# Patient Record
Sex: Female | Born: 1980 | Race: White | Hispanic: No | Marital: Married | Smoking: Current every day smoker
Health system: Southern US, Community
[De-identification: ages and names within clinical notes are randomized; demographics above are authoritative.]

## PROBLEM LIST (undated history)

## (undated) VITALS — BP 125/89 | HR 80 | Temp 97.5°F | Resp 15 | Ht 65.0 in | Wt 140.0 lb

## (undated) DIAGNOSIS — R002 Palpitations: Secondary | ICD-10-CM

## (undated) DIAGNOSIS — I493 Ventricular premature depolarization: Secondary | ICD-10-CM

## (undated) DIAGNOSIS — I10 Essential (primary) hypertension: Secondary | ICD-10-CM

## (undated) DIAGNOSIS — G4700 Insomnia, unspecified: Secondary | ICD-10-CM

## (undated) DIAGNOSIS — J42 Unspecified chronic bronchitis: Secondary | ICD-10-CM

## (undated) DIAGNOSIS — F419 Anxiety disorder, unspecified: Secondary | ICD-10-CM

## (undated) DIAGNOSIS — R55 Syncope and collapse: Secondary | ICD-10-CM

## (undated) DIAGNOSIS — R569 Unspecified convulsions: Secondary | ICD-10-CM

## (undated) DIAGNOSIS — F329 Major depressive disorder, single episode, unspecified: Secondary | ICD-10-CM

## (undated) DIAGNOSIS — K219 Gastro-esophageal reflux disease without esophagitis: Secondary | ICD-10-CM

## (undated) DIAGNOSIS — F32A Depression, unspecified: Secondary | ICD-10-CM

## (undated) DIAGNOSIS — R000 Tachycardia, unspecified: Secondary | ICD-10-CM

## (undated) DIAGNOSIS — R112 Nausea with vomiting, unspecified: Secondary | ICD-10-CM

## (undated) DIAGNOSIS — J189 Pneumonia, unspecified organism: Secondary | ICD-10-CM

## (undated) DIAGNOSIS — J302 Other seasonal allergic rhinitis: Secondary | ICD-10-CM

## (undated) HISTORY — DX: Anxiety disorder, unspecified: F41.9

## (undated) HISTORY — DX: Unspecified convulsions: R56.9

## (undated) HISTORY — DX: Depression, unspecified: F32.A

## (undated) HISTORY — DX: Other seasonal allergic rhinitis: J30.2

## (undated) HISTORY — PX: INTRAUTERINE DEVICE INSERTION: SHX323

## (undated) HISTORY — DX: Syncope and collapse: R55

## (undated) HISTORY — DX: Ventricular premature depolarization: I49.3

## (undated) HISTORY — DX: Tachycardia, unspecified: R00.0

## (undated) HISTORY — DX: Gastro-esophageal reflux disease without esophagitis: K21.9

## (undated) HISTORY — DX: Palpitations: R00.2

## (undated) HISTORY — DX: Major depressive disorder, single episode, unspecified: F32.9

## (undated) HISTORY — DX: Insomnia, unspecified: G47.00

---
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
## 1999-06-21 ENCOUNTER — Emergency Department (HOSPITAL_COMMUNITY): Admission: EM | Admit: 1999-06-21 | Discharge: 1999-06-21 | Payer: Self-pay | Admitting: Internal Medicine

## 1999-11-30 ENCOUNTER — Other Ambulatory Visit: Admission: RE | Admit: 1999-11-30 | Discharge: 1999-11-30 | Payer: Self-pay | Admitting: *Deleted

## 2000-12-13 ENCOUNTER — Other Ambulatory Visit: Admission: RE | Admit: 2000-12-13 | Discharge: 2000-12-13 | Payer: Self-pay | Admitting: *Deleted

## 2005-08-24 ENCOUNTER — Other Ambulatory Visit: Admission: RE | Admit: 2005-08-24 | Discharge: 2005-08-24 | Payer: Self-pay | Admitting: Obstetrics and Gynecology

## 2006-02-05 ENCOUNTER — Inpatient Hospital Stay (HOSPITAL_COMMUNITY): Admission: AD | Admit: 2006-02-05 | Discharge: 2006-02-05 | Payer: Self-pay | Admitting: Obstetrics and Gynecology

## 2006-02-06 ENCOUNTER — Inpatient Hospital Stay (HOSPITAL_COMMUNITY): Admission: AD | Admit: 2006-02-06 | Discharge: 2006-02-06 | Payer: Self-pay | Admitting: Obstetrics and Gynecology

## 2006-02-08 ENCOUNTER — Inpatient Hospital Stay (HOSPITAL_COMMUNITY): Admission: AD | Admit: 2006-02-08 | Discharge: 2006-02-11 | Payer: Self-pay | Admitting: Obstetrics and Gynecology

## 2006-02-12 ENCOUNTER — Encounter: Admission: RE | Admit: 2006-02-12 | Discharge: 2006-03-14 | Payer: Self-pay | Admitting: Obstetrics and Gynecology

## 2008-03-13 ENCOUNTER — Emergency Department (HOSPITAL_COMMUNITY): Admission: EM | Admit: 2008-03-13 | Discharge: 2008-03-14 | Payer: Self-pay | Admitting: Emergency Medicine

## 2009-08-10 ENCOUNTER — Inpatient Hospital Stay (HOSPITAL_COMMUNITY): Admission: AD | Admit: 2009-08-10 | Discharge: 2009-08-10 | Payer: Self-pay | Admitting: Obstetrics and Gynecology

## 2009-09-06 ENCOUNTER — Inpatient Hospital Stay (HOSPITAL_COMMUNITY): Admission: AD | Admit: 2009-09-06 | Discharge: 2009-09-07 | Payer: Self-pay | Admitting: Obstetrics and Gynecology

## 2009-09-19 ENCOUNTER — Inpatient Hospital Stay (HOSPITAL_COMMUNITY): Admission: AD | Admit: 2009-09-19 | Discharge: 2009-09-19 | Payer: Self-pay | Admitting: Obstetrics and Gynecology

## 2009-10-03 ENCOUNTER — Inpatient Hospital Stay (HOSPITAL_COMMUNITY): Admission: AD | Admit: 2009-10-03 | Discharge: 2009-10-03 | Payer: Self-pay | Admitting: Obstetrics and Gynecology

## 2009-10-13 ENCOUNTER — Inpatient Hospital Stay (HOSPITAL_COMMUNITY): Admission: AD | Admit: 2009-10-13 | Discharge: 2009-10-15 | Payer: Self-pay | Admitting: Obstetrics & Gynecology

## 2009-10-18 ENCOUNTER — Encounter: Admission: RE | Admit: 2009-10-18 | Discharge: 2009-11-16 | Payer: Self-pay | Admitting: Obstetrics and Gynecology

## 2009-11-17 ENCOUNTER — Encounter: Admission: RE | Admit: 2009-11-17 | Discharge: 2009-11-25 | Payer: Self-pay | Admitting: Obstetrics and Gynecology

## 2010-02-18 ENCOUNTER — Emergency Department (HOSPITAL_COMMUNITY): Admission: EM | Admit: 2010-02-18 | Discharge: 2010-02-18 | Payer: Self-pay | Admitting: Family Medicine

## 2010-10-27 ENCOUNTER — Emergency Department (HOSPITAL_COMMUNITY)
Admission: EM | Admit: 2010-10-27 | Discharge: 2010-10-27 | Payer: Self-pay | Source: Home / Self Care | Admitting: Family Medicine

## 2011-03-01 LAB — CBC
HCT: 26.1 % — ABNORMAL LOW (ref 36.0–46.0)
HCT: 35.5 % — ABNORMAL LOW (ref 36.0–46.0)
Hemoglobin: 9.1 g/dL — ABNORMAL LOW (ref 12.0–15.0)
MCV: 93.2 fL (ref 78.0–100.0)
RBC: 2.79 MIL/uL — ABNORMAL LOW (ref 3.87–5.11)
RBC: 3.81 MIL/uL — ABNORMAL LOW (ref 3.87–5.11)
RDW: 13.8 % (ref 11.5–15.5)
WBC: 10.5 10*3/uL (ref 4.0–10.5)

## 2011-03-01 LAB — RPR: RPR Ser Ql: NONREACTIVE

## 2011-03-24 ENCOUNTER — Emergency Department (HOSPITAL_COMMUNITY)
Admission: EM | Admit: 2011-03-24 | Discharge: 2011-03-24 | Disposition: A | Discharge status: Home / Self Care | Payer: 59 | Attending: Emergency Medicine | Admitting: Emergency Medicine

## 2011-03-24 DIAGNOSIS — R000 Tachycardia, unspecified: Secondary | ICD-10-CM | POA: Insufficient documentation

## 2011-03-24 DIAGNOSIS — R002 Palpitations: Secondary | ICD-10-CM | POA: Insufficient documentation

## 2011-03-24 DIAGNOSIS — Z79899 Other long term (current) drug therapy: Secondary | ICD-10-CM | POA: Insufficient documentation

## 2011-03-24 DIAGNOSIS — R259 Unspecified abnormal involuntary movements: Secondary | ICD-10-CM | POA: Insufficient documentation

## 2011-03-24 DIAGNOSIS — F329 Major depressive disorder, single episode, unspecified: Secondary | ICD-10-CM | POA: Insufficient documentation

## 2011-03-24 DIAGNOSIS — J45909 Unspecified asthma, uncomplicated: Secondary | ICD-10-CM | POA: Insufficient documentation

## 2011-03-24 DIAGNOSIS — R0789 Other chest pain: Secondary | ICD-10-CM | POA: Insufficient documentation

## 2011-03-24 DIAGNOSIS — F3289 Other specified depressive episodes: Secondary | ICD-10-CM | POA: Insufficient documentation

## 2011-03-24 LAB — POCT I-STAT, CHEM 8
Calcium, Ion: 1.1 mmol/L — ABNORMAL LOW (ref 1.12–1.32)
Chloride: 108 mEq/L (ref 96–112)
Creatinine, Ser: 0.6 mg/dL (ref 0.4–1.2)
Glucose, Bld: 112 mg/dL — ABNORMAL HIGH (ref 70–99)
HCT: 36 % (ref 36.0–46.0)

## 2011-03-28 ENCOUNTER — Ambulatory Visit: Payer: Self-pay | Admitting: Physical Therapy

## 2011-03-29 ENCOUNTER — Ambulatory Visit: Payer: Commercial Managed Care - PPO | Attending: Internal Medicine | Admitting: Physical Therapy

## 2011-03-29 DIAGNOSIS — M25519 Pain in unspecified shoulder: Secondary | ICD-10-CM | POA: Insufficient documentation

## 2011-03-29 DIAGNOSIS — IMO0001 Reserved for inherently not codable concepts without codable children: Secondary | ICD-10-CM | POA: Insufficient documentation

## 2011-03-29 DIAGNOSIS — R293 Abnormal posture: Secondary | ICD-10-CM | POA: Insufficient documentation

## 2011-04-05 ENCOUNTER — Encounter: Payer: Commercial Managed Care - PPO | Admitting: Physical Therapy

## 2011-04-11 ENCOUNTER — Encounter: Payer: Commercial Managed Care - PPO | Admitting: Physical Therapy

## 2011-04-14 ENCOUNTER — Telehealth: Payer: Self-pay | Admitting: Internal Medicine

## 2011-04-14 DIAGNOSIS — R000 Tachycardia, unspecified: Secondary | ICD-10-CM

## 2011-04-14 NOTE — Discharge Summary (Signed)
NAMELAURENASHLEY, VIAR NO.:  [id number]   MEDICAL RECORD NO.:  [id number]          PATIENT TYPE:  INP   LOCATION:  9119                          FACILITY:  [facility]   PHYSICIAN:  Duke Salvia. Marcelle Overlie, M.D.DATE OF BIRTH:  1981-10-08   DATE OF ADMISSION:  02/08/2006  DATE OF DISCHARGE:  02/11/2006                                 DISCHARGE SUMMARY   ADMITTING DIAGNOSES:  1.  Intrauterine pregnancy at 11 weeks estimated gestational age.  2.  Pregnancy-induced hypertension.  3.  Breech presentation.   DISCHARGE DIAGNOSES:  1.  Status post low transverse cesarean section.  2.  Viable female infant.   PROCEDURE:  Primary low transverse cesarean section.   REASON FOR ADMISSION:  Please see dictated H&P.   HOSPITAL COURSE:  The patient is a 30 year old primigravida that was  observed in the office at 36 weeks estimated gestational age with noted  elevation in blood pressure.  The patient had been hospitalized recently for  observation regarding elevation in blood pressure.  The patient had had PIH  labs which had been within normal limits.  The patient was now noted in the  office with blood pressure in the 140s to 150s over 100 with lowest blood  pressure being 140/80.  The patient denied any headache, blurred vision or  right upper quadrant pain.  The baby was also noted to be in the breech  presentation.  Due to severe elevation in blood pressure and proteinuria the  patient was admitted now for delivery.  The patient was then transferred to  the operating room where spinal anesthesia was administered without  difficulty.  A low transverse incision was made with the delivery of a  viable female infant weighing 5 pounds 0 ounces with Apgars of 9 at one minute  and 9 at five minutes.  Arterial cord pH was 7.35.  The patient tolerated  the procedure well and was taken to the recovery room in stable condition.  The patient was then transferred to the adult intensive care unit  where  magnesium sulfate was administered and the patient could be observed closely  over the next 24 hours.  On the following morning the patient was without  complaint.  Vital signs were stable.  Blood pressure 110/80.  Laboratory  findings revealed a sodium of 133, SGOT of 19, SGPT of 11.  Other laboratory  findings revealed a hemoglobin of 9.6; platelet count 171,000; wbc count of  12.6.  Fundus was firm and nontender.  Incision was clean, dry and intact.  The patient continued on magnesium sulfate for the following 24 hours.  On  postoperative day #2 the patient was doing well.  She was afebrile.  Fundus  firm and nontender.  Incision was clean, dry and intact.  Abdomen soft.  The  patient was now transferred to the mother-baby unit.  Magnesium sulfate was  now discontinued.  On postoperative day #3 the patient remained afebrile.  Fundus firm and nontender and vital signs were stable with blood pressure  121/80 to 150/98.  Lungs were clear to auscultation.  Incision was  clean,  dry and intact.  The patient was ambulating well and tolerating a regular  diet without complaints of nausea and vomiting.  The patient continued to  deny headache, right upper quadrant pain or blurred vision.  Instructions  were given and the patient was later discharged home.   CONDITION ON DISCHARGE:  Stable.   DIET:  Regular as tolerated.   ACTIVITY:  No heavy lifting, no driving x2 weeks, no vaginal entry.   FOLLOWUP:  The patient to follow up in the office in 1 week for an incision  check.  She is to call for temperature greater than 100 degrees, persistent  nausea and vomiting, heavy vaginal bleeding, and/or redness or drainage from  the incisional site.  The patient was also to call for headache, blurred  vision, or right upper quadrant pain.   DISCHARGE MEDICATIONS:  1.  Tylox #30 one p.o. every 4-6 hours p.r.n.  2.  Motrin 600 mg every 6 hours.  3.  Prenatal vitamins one p.o. daily.  4.   Colace one p.o. daily p.r.n.      Julio Sicks, N.P.      Richard M. Marcelle Overlie, M.D.  Electronically Signed    CC/MEDQ  D:  03/06/2006  T:  03/06/2006  Job:  [id number]

## 2011-04-14 NOTE — Telephone Encounter (Signed)
Dr. Graciela Husbands okay to schedule a 2-D echo. Patient aware that the scheduler will call next week to set the date and time of echo. Patient verbalized understanding.

## 2011-04-14 NOTE — Telephone Encounter (Signed)
Left a message to call back.

## 2011-04-14 NOTE — Op Note (Signed)
Kelsey [name], Kelsey [name] NO.:  [id number]   MEDICAL RECORD NO.:  [id number]          PATIENT TYPE:  INP   LOCATION:  9119                          [facility]ACILITY:  [facility]   PHYSICIAN:  Dineen Kid. Rana Snare, M.D.    DATE OF BIRTH:  1981/06/19   DATE OF PROCEDURE:  02/08/2006  DATE OF DISCHARGE:                                 OPERATIVE REPORT   Kelsey [name] presents today for evaluation of elevated blood pressure.  She has  been on bed rest for the last week due to elevated blood pressure.  Seen in  the hospital two days ago with normal preeclampsia labs.  In the office  today had blood pressure 150/112. On presentation to [facility] triage, blood  pressures ranged from in the 140-150 range/80-100. She has trace protein.  Baby is in breech presentation.  Because of preeclampsia and breech  presentation, planned delivery by primary low transverse cesarean section.  The risks and benefits were discussed and informed consent was obtained.  Her estimated date of confinement is March 09, 2006.   OPERATIVE FINDINGS:  Viable female infant, Apgars 9 and 9.  PH arterial 7.35.  Weight is 5 pounds 0 ounces.   DESCRIPTION OF PROCEDURE:  After adequate anesthesia, the patient was placed  in the supine position, left lateral tilt.  She was sterilely prepped and  draped.  Bladder was sterilely drained with a Foley catheter.  A  Pfannenstiel skin incision was made two fingerbreadths above the pubic  symphysis and taken down sharply to the fascia which was incised  transversely, extended superiorly and inferiorly off the bellies of the  rectus muscle which were separated sharply in the midline.  Peritoneum was  entered sharply, bladder flap was created, placed behind the bladder blade.  A low segment myotomy incision was made down to the infant's vertex,  extended laterally with operator's fingertips.  The infant's buttocks were  then delivered atraumatically.  Arms were easily reduced. The head was  easily reduced.  Nuchal cord x1 was also reduced.  Cord was clamped and cut.  Cord blood was obtained.  Placenta was extracted manually.  Uterus was  exteriorized, wiped clean with a dry lap. The myotomy incision was closed in  two layers, the first layer being a running locking layer, the second being  the imbricating layer with 0 Monocryl suture.  Uterus was placed back into  the peritoneal cavity, and after a copious amount of irrigation, adequate  hemostasis was assured.  The peritoneum was closed with a 0 Monocryl.  Rectus muscle plicated in the midline using 0 Monocryl suture.  Irrigation  was applied and after adequate hemostasis, the fascia was closed with a #1  Vicryl in a running fashion.  Irrigation was applied and after adequate  hemostasis, skin stapled and Steri-Strips applied.  The patient tolerated  the procedure well and was stable on transfer to the recovery room.  Sponge  and instrument count was normal x3.  The estimated blood loss was 500 mL.  The patient received 1 g of Rocephin after delivery of the placenta.   DISPOSITION:  The patient will be sent to the adult intensive care unit for  magnesium sulfate for 24 hours.      Dineen Kid Rana Snare, M.D.  Electronically Signed     DCL/MEDQ  D:  02/08/2006  T:  02/10/2006  Job:  [id number]

## 2011-04-14 NOTE — Telephone Encounter (Signed)
Pt having heart palps, ahs an appt 6-5 wants to know if she should have an echo before appt?

## 2011-04-14 NOTE — Telephone Encounter (Signed)
Patient called back. She states this AM her heat rate was 170 beats/ minute this AM at 7:00 AM and she was  light headed and and had chest pain. HR has been 100 beats/ minute the rest of the day. Patient has an appointment on 05/02/11. She would like to know if she can have an echo done before appointment.

## 2011-04-14 NOTE — H&P (Signed)
Kelsey [name], Kelsey [name]               ACCOUNT NO.:  [id number]   MEDICAL RECORD NO.:  [id number]          PATIENT TYPE:  INP   LOCATION:  9372                          [facility]ILITY[facility]  [facility]   PHYSICIAN:  Dineen Kid. Rana Snare, M.D.    DATE OF BIRTH:  [birth year]   DATE OF ADMISSION:  02/08/2006  DATE OF DISCHARGE:                                HISTORY & PHYSICAL   HISTORY OF PRESENT ILLNESS:  This is a 30 year old G1 at [redacted] weeks  gestational age who presented to the office for routine obstetrical care.  She has been having problems with elevated blood pressures over the last  several weeks, consistent with preeclampsia.  She was seen in the hospital 2  days ago for a similar situation and had a normal laboratory evaluation.  She has been on bed rest.  Blood pressures today in the office and then also  on presentation to triage were 140-150/100 with the lowest blood pressure  being 140/80.  She denies any scotomata, headache or right upper quadrant  pain.  Her estimated date of confinement is March 09, 2006.  The baby is  breech presentation.   PAST MEDICAL HISTORY:  Significant for asthma.   PAST SURGICAL HISTORY:  Negative.   MEDICATIONS:  1.  Prenatal vitamins.  2.  Singulair as needed.  3.  Ventolin as needed.   ALLERGIES:  She has an allergy to PENICILLIN which causes a rash; she is  able to tolerate amoxicillin and cephalosporins.   PHYSICAL EXAMINATION:  VITAL SIGNS:  Blood pressure is 150/98.  HEART:  Regular rate and rhythm.  LUNGS:  Clear to auscultation bilaterally.  ABDOMEN:  Gravid, nontender.  EXTREMITIES:  DTRs are 2/4.  She has trace edema.   LABORATORY DATA:  Urinalysis shows trace protein.   IMPRESSION AND PLAN:  Intrauterine pregnancy at 36 weeks with preeclampsia.   PLAN:  Primary low segment transverse cesarean section for delivery due to  breech presentation.   PLAN:  Postpartum magnesium sulfate x24 hours.  I am going to check  preeclampsia labs now and also in 24  hours.  The risks and benefits were  discussed at length.  Informed consent was obtained.      Dineen Kid Rana Snare, M.D.  Electronically Signed     DCL/MEDQ  D:  02/08/2006  T:  02/08/2006  Job:  [id number]

## 2011-04-17 ENCOUNTER — Ambulatory Visit (HOSPITAL_COMMUNITY): Payer: Commercial Managed Care - PPO | Attending: Cardiology

## 2011-04-17 DIAGNOSIS — I379 Nonrheumatic pulmonary valve disorder, unspecified: Secondary | ICD-10-CM | POA: Insufficient documentation

## 2011-04-17 DIAGNOSIS — R000 Tachycardia, unspecified: Secondary | ICD-10-CM | POA: Insufficient documentation

## 2011-04-17 DIAGNOSIS — R072 Precordial pain: Secondary | ICD-10-CM | POA: Insufficient documentation

## 2011-04-17 DIAGNOSIS — I059 Rheumatic mitral valve disease, unspecified: Secondary | ICD-10-CM | POA: Insufficient documentation

## 2011-04-17 DIAGNOSIS — I079 Rheumatic tricuspid valve disease, unspecified: Secondary | ICD-10-CM | POA: Insufficient documentation

## 2011-04-17 DIAGNOSIS — R002 Palpitations: Secondary | ICD-10-CM | POA: Insufficient documentation

## 2011-04-18 ENCOUNTER — Telehealth: Payer: Self-pay | Admitting: Internal Medicine

## 2011-04-18 NOTE — Telephone Encounter (Signed)
Monitor received gave to Kelsey [name]  03/2211/km

## 2011-04-18 NOTE — Telephone Encounter (Signed)
ROI faxed to [facility] @ [phone]  04/18/11/km

## 2011-04-18 NOTE — Telephone Encounter (Signed)
Pt notified Dr. Graciela Husbands has not reviewed echo yet. I told her we would call her with results once reviewed by Dr. Graciela Husbands.

## 2011-04-18 NOTE — Telephone Encounter (Signed)
Echo test results. Call back # [phone].

## 2011-04-20 ENCOUNTER — Institutional Professional Consult (permissible substitution): Payer: Commercial Managed Care - PPO | Admitting: Internal Medicine

## 2011-04-20 NOTE — Telephone Encounter (Signed)
Will forward to Dr. Klein. 

## 2011-04-21 NOTE — Telephone Encounter (Signed)
Reviewed and normal.

## 2011-04-28 NOTE — Telephone Encounter (Signed)
The pt has an appointment on 05/02/11 with Dr. Graciela Husbands.

## 2011-04-29 ENCOUNTER — Encounter: Payer: Self-pay | Admitting: Internal Medicine

## 2011-05-02 ENCOUNTER — Ambulatory Visit (INDEPENDENT_AMBULATORY_CARE_PROVIDER_SITE_OTHER): Payer: Commercial Managed Care - PPO | Admitting: Internal Medicine

## 2011-05-02 ENCOUNTER — Encounter: Payer: Self-pay | Admitting: Internal Medicine

## 2011-05-02 DIAGNOSIS — R9431 Abnormal electrocardiogram [ECG] [EKG]: Secondary | ICD-10-CM

## 2011-05-02 DIAGNOSIS — I4949 Other premature depolarization: Secondary | ICD-10-CM

## 2011-05-02 DIAGNOSIS — I493 Ventricular premature depolarization: Secondary | ICD-10-CM

## 2011-05-02 DIAGNOSIS — R002 Palpitations: Secondary | ICD-10-CM

## 2011-05-02 MED ORDER — METOPROLOL SUCCINATE ER 25 MG PO TB24: 25.0000 mg | ORAL_TABLET | Freq: Every day | ORAL | Status: DC

## 2011-05-02 MED ORDER — METOPROLOL TARTRATE 25 MG PO TABS: 25.0000 mg | ORAL_TABLET | Freq: Two times a day (BID) | ORAL | Status: DC

## 2011-05-02 MED ORDER — ATENOLOL 25 MG PO TABS: 25.0000 mg | ORAL_TABLET | Freq: Every day | ORAL | Status: DC

## 2011-05-02 NOTE — Patient Instructions (Signed)
Your physician has requested that you have an exercise tolerance test. For further information please visit [email or web address] Please also follow instruction sheet, as given.  You will need to have a signal averaged EKG done at [facility] prior to your treadmill test.  Your physician has recommended you make the following change in your medication: - if you start having symptoms. 1) metoprolol succ 25mg  one tablet daily for 2 weeks. 2) metoprolol tart 25mg  one tablet twice daily for 2 weeks. 3) atenolol 25mg  one tablet daily for 2 weeks.

## 2011-05-02 NOTE — Assessment & Plan Note (Signed)
As above. We need to exclude ARVC

## 2011-05-02 NOTE — Assessment & Plan Note (Signed)
The morphology of the PVCs are somewhat unusual for a benign pattern especially in light of her ECG. Her echo is reassuring showing normal left ventricular and right ventricular function and size. We will plan to undertake a treadmill as well as signal-averaged ECG looking to see if we can fulfill criteria for ARVC. Her symptoms right now are quite benign. I will give her prescriptions for atenolol, metoprolol tartrate and succinate to take in the event that she has more symptoms. Her treadmill will be undertaken off beta blockers

## 2011-05-02 NOTE — Progress Notes (Signed)
HPI: Kelsey [name] is a 30 y.o. female Seen at the request of Dr. Lars Pinks because of palpitations.  She is a respiratory therapist. About a month ago, without an antecedent history of viral illness, she began to develop palpitations. The first episode she noted was associated with tachypalpitations that were irregular. She took her heart rate via an O2 sat machine and noted it was 150. She went to the emergency room; by the time she had arrived her symptoms had largely improved. PVCs were identified.  She's had a couple of episodes of palpitations since then. The she describes as hard and irregular . The initial episode was associated with presyncope. The latter episodes have not been. There has been a significant amount of stress.  There is no change in her exercise tolerance.  TSH and hemoglobin were normal Current Outpatient Prescriptions  Medication Sig Dispense Refill  . albuterol (PROVENTIL,VENTOLIN) 90 MCG/ACT inhaler Inhale 2 puffs into the lungs every 4 (four) hours as needed.        . ALPRAZolam (XANAX) 1 MG tablet Take 1 mg by mouth at bedtime as needed.        . citalopram (CELEXA) 40 MG tablet Take by mouth daily.        . cyclobenzaprine (FLEXERIL) 10 MG tablet Take 10 mg by mouth 3 (three) times daily as needed.        . diphenhydrAMINE (BENADRYL) 25 MG tablet Take by mouth daily as needed.        . NON FORMULARY Birth control tablet once a day7         Allergies  Allergen Reactions  . Penicillins     Past Medical History  Diagnosis Date  . PVC (premature ventricular contraction)   . Palpitations   . Pre-syncope   . Tachycardia   . Depression   . Insomnia   . Seasonal allergies   . Asthma     Past Surgical History  Procedure Date  . Cesarean section 10/15/2009, 02/08/2006    times 2    No family history on file.  History   Social History  . Marital Status: Married    Spouse Name: N/A    Number of Children: 2  . Years of Education: N/A    Occupational History  .     Social History Main Topics  . Smoking status: Never Smoker   . Smokeless tobacco: Not on file  . Alcohol Use: No  . Drug Use: No  . Sexually Active: Not on file   Other Topics Concern  . Not on file   Social History Narrative  . No narrative on file    Fourteen point review of systems was negative except as noted in HPI and PMH   PHYSICAL EXAMINATION  Blood pressure 120/80, pulse 78, resp. rate 18, height 5\' 4"  (1.626 m), weight 152 lb 12.8 oz (69.31 kg).   Well developed and nourished in no acute distress HENT normal Neck supple with JVP-flat Carotids brisk and full without bruits Back without scoliosis or kyphosis Clear Irregular rate with split S1 Abd-soft with active BS without hepatomegaly or midline pulsation Femoral pulses 2+ distal pulses intact No Clubbing cyanosis edema Skin-warm and dry LN-neg submandibular and supraclavicular A & Oriented CN 3-12 normal  Grossly normal sensory and motor function Affect engaging . Electrocardiogram demonstrates sinus rhythm at 76 Intervals 0.09/2007/0.40 there T wave inversions in the inferolateral inferior leads and ST segment flattening in eating tear leads with T wave  inversions are biphasic in lead V3 and V4

## 2011-05-02 NOTE — Assessment & Plan Note (Signed)
Palpitations correlated with sinus tachycardia on Holter monitor. There was evidence of gradual increase and decrease in rates making an atrial arrhythmia somewhat unlikely although these tachypalpitations occurred post exercise and while seated.

## 2011-05-05 ENCOUNTER — Telehealth: Payer: Self-pay | Admitting: Internal Medicine

## 2011-05-05 NOTE — Telephone Encounter (Signed)
Per pt calling would like the order for ekg fax to her at work fax # fax # [phone]

## 2011-05-05 NOTE — Telephone Encounter (Signed)
I spoke with the patient. She has paperwork at home for her signal averaged EKG, but while she was at work today, she was going to try to get this done. She could only have it until 3pm today. She will get her paperwork from home and have this done on Monday.

## 2011-05-05 NOTE — Telephone Encounter (Signed)
I will need to clarify this with the PCC's, but patient should have an appt for a signal averaged EKG. I was told by [place] last week that we did not need to write an order as it could be accessed by the hospital.

## 2011-05-08 ENCOUNTER — Encounter: Payer: Self-pay | Admitting: Internal Medicine

## 2011-05-09 ENCOUNTER — Ambulatory Visit (HOSPITAL_COMMUNITY)
Admission: RE | Admit: 2011-05-09 | Discharge: 2011-05-09 | Disposition: A | Discharge status: Home / Self Care | Payer: 59 | Source: Ambulatory Visit | Attending: Internal Medicine | Admitting: Internal Medicine

## 2011-05-09 DIAGNOSIS — R9431 Abnormal electrocardiogram [ECG] [EKG]: Secondary | ICD-10-CM | POA: Insufficient documentation

## 2011-06-06 ENCOUNTER — Ambulatory Visit (INDEPENDENT_AMBULATORY_CARE_PROVIDER_SITE_OTHER): Payer: 59 | Admitting: Internal Medicine

## 2011-06-06 DIAGNOSIS — R002 Palpitations: Secondary | ICD-10-CM

## 2011-06-06 NOTE — Progress Notes (Signed)
Exercise Treadmill Test  Pre-Exercise Testing Evaluation Rhythm: Sinus Rhythem  Rate: 80   PR:  .13 QRS:  .07  QT:  .41 QTc: .48     Test  Exercise Tolerance Test Ordering MD: Sherryl Manges, MD  Interpreting MD:  Sherryl Manges, MD  Unique Test No: 1  Treadmill:  1  Indication for ETT: Palps  Contraindication to ETT: No   Stress Modality: exercise - treadmill  Cardiac Imaging Performed: non   Protocol: standard Bruce - submaximal  Max BP:160/78  Max MPHR (bpm):  190 85% MPR (bpm):  161  MPHR obtained (bpm):  173 % MPHR obtained:  91  Reached 85% MPHR (min:sec):  3:00 Total Exercise Time (min-sec):  3:26  Workload in METS:  8.3 Borg Scale: 14  Reason ETT Terminated:      ST Segment Analysis At Rest: normal ST segments - no evidence of significant ST depression With Exercise: no evidence of significant ST depression  Other Information Arrhythmia:  Yes Angina during ETT:  absent (0) Quality of ETT:  diagnostic  ETT Interpretation:  normal - no evidence of ischemia by ST analysis  Comments: 1) decrease frequency of PVC with exercise. Recovery notable for couplets-rare 2) exercise associated hypotension. 150 preexercise to 106 nadir prior to termiantion at (abbreviated stage 3)  Immediate post exercise recovery./  There was some lightheadedness but this is unusual  Recommendations: Continue meds- MRI RV  cardiomyopathy

## 2011-06-12 ENCOUNTER — Encounter: Payer: Self-pay | Admitting: Internal Medicine

## 2011-08-30 ENCOUNTER — Inpatient Hospital Stay (INDEPENDENT_AMBULATORY_CARE_PROVIDER_SITE_OTHER)
Admission: RE | Admit: 2011-08-30 | Discharge: 2011-08-30 | Disposition: A | Discharge status: Home / Self Care | Payer: 59 | Source: Ambulatory Visit | Attending: Family Medicine | Admitting: Family Medicine

## 2011-08-30 DIAGNOSIS — R112 Nausea with vomiting, unspecified: Secondary | ICD-10-CM

## 2011-08-30 DIAGNOSIS — R197 Diarrhea, unspecified: Secondary | ICD-10-CM

## 2012-02-10 ENCOUNTER — Emergency Department (HOSPITAL_COMMUNITY): Payer: 59

## 2012-02-10 ENCOUNTER — Encounter (HOSPITAL_COMMUNITY): Payer: Self-pay

## 2012-02-10 ENCOUNTER — Emergency Department (HOSPITAL_COMMUNITY)
Admission: EM | Admit: 2012-02-10 | Discharge: 2012-02-10 | Disposition: A | Discharge status: Home / Self Care | Payer: 59 | Attending: Emergency Medicine | Admitting: Emergency Medicine

## 2012-02-10 ENCOUNTER — Other Ambulatory Visit: Payer: Self-pay

## 2012-02-10 DIAGNOSIS — J45909 Unspecified asthma, uncomplicated: Secondary | ICD-10-CM | POA: Insufficient documentation

## 2012-02-10 DIAGNOSIS — R002 Palpitations: Secondary | ICD-10-CM | POA: Insufficient documentation

## 2012-02-10 DIAGNOSIS — M79609 Pain in unspecified limb: Secondary | ICD-10-CM | POA: Insufficient documentation

## 2012-02-10 DIAGNOSIS — R0602 Shortness of breath: Secondary | ICD-10-CM | POA: Insufficient documentation

## 2012-02-10 LAB — CBC
HCT: 37.5 % (ref 36.0–46.0)
MCH: 31.6 pg (ref 26.0–34.0)
MCHC: 33.9 g/dL (ref 30.0–36.0)
MCV: 93.3 fL (ref 78.0–100.0)
RDW: 12.7 % (ref 11.5–15.5)

## 2012-02-10 LAB — DIFFERENTIAL
Basophils Absolute: 0 10*3/uL (ref 0.0–0.1)
Basophils Relative: 0 % (ref 0–1)
Eosinophils Absolute: 0.1 10*3/uL (ref 0.0–0.7)
Eosinophils Relative: 2 % (ref 0–5)
Monocytes Absolute: 0.5 10*3/uL (ref 0.1–1.0)
Monocytes Relative: 9 % (ref 3–12)
Neutro Abs: 2.6 10*3/uL (ref 1.7–7.7)

## 2012-02-10 LAB — POCT I-STAT, CHEM 8
Calcium, Ion: 1.26 mmol/L (ref 1.12–1.32)
Glucose, Bld: 81 mg/dL (ref 70–99)
HCT: 39 % (ref 36.0–46.0)
TCO2: 27 mmol/L (ref 0–100)

## 2012-02-10 LAB — POCT I-STAT TROPONIN I: Troponin i, poc: 0 ng/mL (ref 0.00–0.08)

## 2012-02-10 NOTE — ED Notes (Signed)
Pt denies chest pain at the time. Up to bathroom without difficulty. Vital signs stable. No signs of distress at the present. Family remains at bedside.

## 2012-02-10 NOTE — ED Provider Notes (Signed)
History     CSN: [id number]  Arrival date & time 02/10/12  1457   First MD Initiated Contact with Patient 02/10/12 1629      Chief Complaint  Patient presents with  . Palpitations  . Arm Pain    (Consider location/radiation/quality/duration/timing/severity/associated sxs/prior treatment) Patient is a 31 y.o. female presenting with palpitations. The history is provided by the patient.  Palpitations  This is a recurrent problem. The current episode started 1 to 2 hours ago. The problem occurs constantly. The problem has been resolved. The problem is associated with an unknown factor. Associated symptoms include chest pressure, irregular heartbeat, nausea and shortness of breath. Pertinent negatives include no fever, no chest pain, no near-syncope, no abdominal pain, no vomiting, no headaches, no leg pain, no lower extremity edema, no dizziness and no weakness. She has tried nothing for the symptoms. Her past medical history does not include hyperthyroidism.    Past Medical History  Diagnosis Date  . PVC (premature ventricular contraction)   . Palpitations   . Pre-syncope   . Tachycardia   . Depression   . Insomnia   . Seasonal allergies   . Asthma     Past Surgical History  Procedure Date  . Cesarean section 10/15/2009, 02/08/2006    times 2    History reviewed. No pertinent family history.  History  Substance Use Topics  . Smoking status: Never Smoker   . Smokeless tobacco: Not on file  . Alcohol Use: No    OB History    Grav Para Term Preterm Abortions TAB SAB Ect Mult Living                  Review of Systems  Constitutional: Negative for fever and chills.  HENT: Negative for congestion and rhinorrhea.   Respiratory: Positive for shortness of breath.   Cardiovascular: Positive for palpitations. Negative for chest pain, leg swelling and near-syncope.  Gastrointestinal: Positive for nausea. Negative for vomiting, abdominal pain and constipation.    Genitourinary: Negative for urgency, decreased urine volume and difficulty urinating.  Skin: Negative for wound.  Neurological: Negative for dizziness, syncope, weakness and headaches.  Psychiatric/Behavioral: Negative for confusion.  All other systems reviewed and are negative.    Allergies  Penicillins  Home Medications   Current Outpatient Rx  Name Route Sig Dispense Refill  . ALBUTEROL 90 MCG/ACT IN AERS Inhalation Inhale 2 puffs into the lungs every 4 (four) hours as needed. For shortness of breath    . ALPRAZOLAM 0.5 MG PO TABS Oral Take 0.5 mg by mouth at bedtime as needed. For anxiety    . CITALOPRAM HYDROBROMIDE 40 MG PO TABS Oral Take by mouth daily.        BP 121/90  Pulse 98  Temp(Src) 97.2 F (36.2 C) (Oral)  Resp 13  SpO2 96%  Physical Exam  Nursing note and vitals reviewed. Constitutional: She is oriented to person, place, and time. She appears well-developed and well-nourished. No distress.  HENT:  Head: Normocephalic and atraumatic.  Right Ear: External ear normal.  Left Ear: External ear normal.  Nose: Nose normal.  Mouth/Throat: Oropharynx is clear and moist.  Neck: Neck supple. No thyromegaly present.  Cardiovascular: Normal rate, regular rhythm, normal heart sounds and intact distal pulses.   No murmur heard. Pulmonary/Chest: Effort normal and breath sounds normal. No respiratory distress. She has no wheezes. She has no rales. She exhibits no tenderness.  Abdominal: Soft. She exhibits no distension. There is no tenderness.  Musculoskeletal: She exhibits no edema.  Lymphadenopathy:    She has no cervical adenopathy.  Neurological: She is alert and oriented to person, place, and time.  Skin: Skin is warm and dry. She is not diaphoretic. No pallor.    ED Course  Procedures (including critical care time)   Labs Reviewed  CBC  DIFFERENTIAL  POCT I-STAT, CHEM 8  POCT I-STAT TROPONIN I  POCT PREGNANCY, URINE  POCT I-STAT TROPONIN I   Dg  Chest 2 View  02/10/2012  *RADIOLOGY REPORT*  Clinical Data: Palpitations  CHEST - 2 VIEW  Comparison: None.  Findings: Normal cardiac silhouette.  No effusion, infiltrate, pneumothorax.  There is a pectus deformity noted on the lateral projection.  The haziness in the right infrahilar region and may relate to this deformity.  No focal consolidation.  No pneumothorax.  IMPRESSION:  1.  Pectus deformity.  2.   Hazy soft tissue density projecting over the right infrahilar region may relate to the deformity.  Correlation with prior radiographs may be helpful.  If none available, consider CT for further evaluation.  Original Report Authenticated By: Genevive Bi, M.D.     Date: 02/11/2012  Rate: 99  Rhythm: normal sinus rhythm  QRS Axis: normal  Intervals: normal  ST/T Wave abnormalities: nonspecific ST/T changes in inferior leads  Conduction Disutrbances:none  Narrative Interpretation:   Old EKG Reviewed: changes noted - nonspecific ST/T changes new    1. Palpitations       MDM  31 yo female with chest squeezing, palpitations and bilateral arm pain that started acutely 2 hours prior to arrival. Asymptomatic since getting to hospital. Counted her HR during these episodes and was noted to have HR in the 120s. Hx of similar symptoms several months ago, had echo, stress test and holter monitor, which showed PVCs as likely cause. Here patient is asymptomatic, with questionable changes on her EKG here. Due to normal HR now and patient being well appearing and asymptomatic, labs and delta troponin checked. No anemia or electrolyte disturbance noted, and delta troponins negative. CXR negative. Discussed return precautions and PCP follow up with patient.        Pricilla Loveless, MD 02/11/12 [place] [facility]

## 2012-02-10 NOTE — ED Notes (Signed)
Pt complains of chest pain, heart racing and right arm pain onset 1 hour ago.

## 2012-02-10 NOTE — ED Notes (Signed)
Pt presents to department for evaluation of midsternal chest pain radiating to R arm and back. Onset this afternoon while resting. Pt states she felt like her heart was "squeezing." also c/o SOB and fatigue. States intermittent chest pain at the time, rating 4/10. Lung sounds clear and equal bilaterally. Speaking complete sentences. Skin warm and day. Pt conscious alert and oriented x4. No signs of distress at the time.

## 2012-02-10 NOTE — ED Notes (Signed)
Lab at the bedside attempting to draw. Vital signs stable. No signs of distress noted.

## 2012-02-10 NOTE — Discharge Instructions (Signed)
 Palpitations  A palpitation is the feeling that your heartbeat is irregular or is faster than normal. Although this is frightening, it usually is not serious. Palpitations may be caused by excesses of smoking, caffeine, or alcohol. They are also brought on by stress and anxiety. Sometimes, they are caused by heart disease. Unless otherwise noted, your caregiver did not find any signs of serious illness at this time. HOME CARE INSTRUCTIONS  To help prevent palpitations:  Drink decaffeinated coffee, tea, and soda pop. Avoid chocolate.   If you smoke or drink alcohol, quit or cut down as much as possible.   Reduce your stress or anxiety level. Biofeedback, yoga, or meditation will help you relax. Physical activity such as swimming, jogging, or walking also may be helpful.  SEEK MEDICAL CARE IF:   You continue to have a fast heartbeat.   Your palpitations occur more often.  SEEK IMMEDIATE MEDICAL CARE IF: You develop chest pain, shortness of breath, severe headache, dizziness, or fainting. Document Released: 11/10/2000 Document Revised: 11/02/2011 Document Reviewed: 01/10/2008 [facility] Patient Information 2012 [place], [place].

## 2012-02-11 NOTE — ED Provider Notes (Signed)
I saw and evaluated the patient, reviewed the resident's note and I agree with the findings and plan.   Loren Racer, MD 02/11/12 [phone]

## 2012-03-19 ENCOUNTER — Encounter (HOSPITAL_COMMUNITY): Payer: Self-pay

## 2012-03-19 ENCOUNTER — Emergency Department (HOSPITAL_COMMUNITY)
Admission: EM | Admit: 2012-03-19 | Discharge: 2012-03-19 | Disposition: A | Discharge status: Home / Self Care | Payer: 59 | Source: Home / Self Care | Attending: Family Medicine | Admitting: Family Medicine

## 2012-03-19 DIAGNOSIS — J45901 Unspecified asthma with (acute) exacerbation: Secondary | ICD-10-CM

## 2012-03-19 MED ORDER — IPRATROPIUM BROMIDE 0.02 % IN SOLN
0.5000 mg | Freq: Once | RESPIRATORY_TRACT | Status: AC
  Administered 2012-03-19: 0.5 mg via RESPIRATORY_TRACT

## 2012-03-19 MED ORDER — METHYLPREDNISOLONE SODIUM SUCC 125 MG IJ SOLR
125.0000 mg | Freq: Once | INTRAMUSCULAR | Status: AC
  Administered 2012-03-19: 125 mg via INTRAMUSCULAR

## 2012-03-19 MED ORDER — ALBUTEROL SULFATE (5 MG/ML) 0.5% IN NEBU
5.0000 mg | INHALATION_SOLUTION | Freq: Once | RESPIRATORY_TRACT | Status: AC
  Administered 2012-03-19: 5 mg via RESPIRATORY_TRACT

## 2012-03-19 MED ORDER — METHYLPREDNISOLONE SODIUM SUCC 125 MG IJ SOLR
INTRAMUSCULAR | Status: AC
  Filled 2012-03-19: qty 2

## 2012-03-19 MED ORDER — METHYLPREDNISOLONE 4 MG PO KIT: PACK | ORAL | Status: AC

## 2012-03-19 MED ORDER — ALBUTEROL SULFATE (5 MG/ML) 0.5% IN NEBU
INHALATION_SOLUTION | RESPIRATORY_TRACT | Status: AC
  Filled 2012-03-19: qty 1

## 2012-03-19 NOTE — ED Notes (Signed)
Peak Flow 400- pt states this is her normal baseline

## 2012-03-19 NOTE — ED Notes (Signed)
Pt has asthma.  C/o SOB, wheezing that started this am- reports using albuterol inhaler without relief.  States she also had flare up on Sunday but that it was not as bad as today.

## 2012-03-19 NOTE — ED Provider Notes (Signed)
History     CSN: [id number]  Arrival date & time 03/19/12  4098   First MD Initiated Contact with Patient 03/19/12 [phone]      Chief Complaint  Patient presents with  . Shortness of Breath    (Consider location/radiation/quality/duration/timing/severity/associated sxs/prior treatment) Patient is a 31 y.o. female presenting with shortness of breath. The history is provided by the patient.  Shortness of Breath  The current episode started today (had episode on sun but worse this am.). The problem has been gradually worsening. The problem is moderate. Associated symptoms include shortness of breath and wheezing. Pertinent negatives include no cough.    Past Medical History  Diagnosis Date  . PVC (premature ventricular contraction)   . Palpitations   . Pre-syncope   . Tachycardia   . Depression   . Insomnia   . Seasonal allergies   . Asthma     Past Surgical History  Procedure Date  . Cesarean section 10/15/2009, 02/08/2006    times 2    No family history on file.  History  Substance Use Topics  . Smoking status: Never Smoker   . Smokeless tobacco: Not on file  . Alcohol Use: No    OB History    Grav Para Term Preterm Abortions TAB SAB Ect Mult Living                  Review of Systems  Constitutional: Negative.   HENT: Negative.   Respiratory: Positive for shortness of breath and wheezing. Negative for cough.   Cardiovascular: Negative.     Allergies  Penicillins  Home Medications   Current Outpatient Rx  Name Route Sig Dispense Refill  . FLUTICASONE-SALMETEROL 250-50 MCG/DOSE IN AEPB Inhalation Inhale 1 puff into the lungs every 12 (twelve) hours.    . ALBUTEROL 90 MCG/ACT IN AERS Inhalation Inhale 2 puffs into the lungs every 4 (four) hours as needed. For shortness of breath    . ALPRAZOLAM 0.5 MG PO TABS Oral Take 0.5 mg by mouth at bedtime as needed. For anxiety    . CITALOPRAM HYDROBROMIDE 40 MG PO TABS Oral Take by mouth daily.      .  METHYLPREDNISOLONE 4 MG PO KIT  follow package directions, start on wed and take until finished. 21 tablet 0    BP 134/96  Pulse 93  Temp(Src) 97.8 F (36.6 C) (Oral)  Resp 26  SpO2 98%  LMP 03/14/2012  Physical Exam  Nursing note and vitals reviewed. Constitutional: She is oriented to person, place, and time. She appears well-developed and well-nourished.  HENT:  Head: Normocephalic.  Right Ear: External ear normal.  Left Ear: External ear normal.  Nose: Nose normal.  Mouth/Throat: Oropharynx is clear and moist.  Eyes: Pupils are equal, round, and reactive to light.  Neck: Normal range of motion. Neck supple.  Cardiovascular: Normal rate, regular rhythm, normal heart sounds and intact distal pulses.   Pulmonary/Chest: She has wheezes.  Neurological: She is alert and oriented to person, place, and time.  Skin: Skin is warm and dry.    ED Course  Procedures (including critical care time)  Labs Reviewed - No data to display No results found.   1. Asthma exacerbation       MDM  Sx improved and lungs clear, peak flow 400 after neb and solumedrol.        Linna Hoff, MD 03/19/12 1001

## 2012-03-19 NOTE — Discharge Instructions (Signed)
Take the medicine as prescribed, use the peak flow meter to judge your asthma severity, return as needed or see your doctor.

## 2012-03-19 NOTE — ED Notes (Signed)
Pt states she feels much better, however jittery form breathing treatment.  Lungs clear and in no distress.

## 2012-04-25 ENCOUNTER — Encounter (HOSPITAL_COMMUNITY): Payer: Self-pay | Admitting: *Deleted

## 2012-04-25 ENCOUNTER — Observation Stay (HOSPITAL_COMMUNITY)
Admission: EM | Admit: 2012-04-25 | Discharge: 2012-04-25 | Disposition: A | Discharge status: Home / Self Care | Payer: 59 | Attending: Emergency Medicine | Admitting: Emergency Medicine

## 2012-04-25 DIAGNOSIS — J45901 Unspecified asthma with (acute) exacerbation: Principal | ICD-10-CM | POA: Insufficient documentation

## 2012-04-25 DIAGNOSIS — R0602 Shortness of breath: Secondary | ICD-10-CM | POA: Insufficient documentation

## 2012-04-25 MED ORDER — PREDNISONE 10 MG PO TABS: 20.0000 mg | ORAL_TABLET | Freq: Every day | ORAL | Status: DC

## 2012-04-25 MED ORDER — ALBUTEROL SULFATE (5 MG/ML) 0.5% IN NEBU
5.0000 mg | INHALATION_SOLUTION | Freq: Once | RESPIRATORY_TRACT | Status: AC
  Administered 2012-04-25: 5 mg via RESPIRATORY_TRACT
  Filled 2012-04-25: qty 1

## 2012-04-25 MED ORDER — PREDNISONE 20 MG PO TABS: 60.0000 mg | ORAL_TABLET | Freq: Every day | ORAL | Status: DC

## 2012-04-25 MED ORDER — ONDANSETRON HCL 4 MG/2ML IJ SOLN: 4.0000 mg | Freq: Four times a day (QID) | INTRAMUSCULAR | Status: DC | PRN

## 2012-04-25 MED ORDER — ALBUTEROL SULFATE HFA 108 (90 BASE) MCG/ACT IN AERS
2.0000 | INHALATION_SPRAY | RESPIRATORY_TRACT | Status: DC
  Administered 2012-04-25: 2 via RESPIRATORY_TRACT
  Filled 2012-04-25: qty 6.7

## 2012-04-25 MED ORDER — PREDNISONE 20 MG PO TABS
60.0000 mg | ORAL_TABLET | Freq: Once | ORAL | Status: AC
  Administered 2012-04-25: 60 mg via ORAL
  Filled 2012-04-25: qty 3

## 2012-04-25 NOTE — ED Notes (Signed)
Pt has been coughing for several days .  She awoke at 12 am with difficulty breathing.  She used her advair inhaler every hour and took 1 5 mg albuterol neb tx at 4 am with no relief.  Exp wheezing noted with labored breathing.

## 2012-04-25 NOTE — ED Notes (Signed)
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  Patient to be moved to CDU.

## 2012-04-25 NOTE — ED Provider Notes (Signed)
Assumed care in CDU.  Pt w/ hx of asthma c/o SOB.  Currently on asthma protocol.  7:32 AM Pt felt improvement after prednisone and nebs treatment.  Request to be d/c.  She's deem safe to be d/c by RT.  Will prescribe steroid pack.  Inhaler given here in ED.  VSS.    Fayrene Helper, PA-C 04/25/12 [phone]

## 2012-04-25 NOTE — ED Notes (Signed)
Patient with increasing shortness of breath for last few days.  Patient states she has seasonal allergies, cough and shortness of breath for last week, increasing last two days.  Patient states that she tried home HFA, no relief.  Patient states she has not has a problem in last 2-3 years.

## 2012-04-25 NOTE — ED Notes (Signed)
Patient states she is feeling much better at this time.

## 2012-04-25 NOTE — Discharge Instructions (Signed)
Asthma Attack Prevention HOW CAN ASTHMA BE PREVENTED? Currently, there is no way to prevent asthma from starting. However, you can take steps to control the disease and prevent its symptoms after you have been diagnosed. Learn about your asthma and how to control it. Take an active role to control your asthma by working with your caregiver to create and follow an asthma action plan. An asthma action plan guides you in taking your medicines properly, avoiding factors that make your asthma worse, tracking your level of asthma control, responding to worsening asthma, and seeking emergency care when needed. To track your asthma, keep records of your symptoms, check your peak flow number using a peak flow meter (handheld device that shows how well air moves out of your lungs), and get regular asthma checkups.  Other ways to prevent asthma attacks include:  Use medicines as your caregiver directs.   Identify and avoid things that make your asthma worse (as much as you can).   Keep track of your asthma symptoms and level of control.   Get regular checkups for your asthma.   With your caregiver, write a detailed plan for taking medicines and managing an asthma attack. Then be sure to follow your action plan. Asthma is an ongoing condition that needs regular monitoring and treatment.   Identify and avoid asthma triggers. A number of outdoor allergens and irritants (pollen, mold, cold air, air pollution) can trigger asthma attacks. Find out what causes or makes your asthma worse, and take steps to avoid those triggers (see below).   Monitor your breathing. Learn to recognize warning signs of an attack, such as slight coughing, wheezing or shortness of breath. However, your lung function may already decrease before you notice any signs or symptoms, so regularly measure and record your peak airflow with a home peak flow meter.   Identify and treat attacks early. If you act quickly, you're less likely to have  a severe attack. You will also need less medicine to control your symptoms. When your peak flow measurements decrease and alert you to an upcoming attack, take your medicine as instructed, and immediately stop any activity that may have triggered the attack. If your symptoms do not improve, get medical help.   Pay attention to increasing quick-relief inhaler use. If you find yourself relying on your quick-relief inhaler (such as albuterol), your asthma is not under control. See your caregiver about adjusting your treatment.  IDENTIFY AND CONTROL FACTORS THAT MAKE YOUR ASTHMA WORSE A number of common things can set off or make your asthma symptoms worse (asthma triggers). Keep track of your asthma symptoms for several weeks, detailing all the environmental and emotional factors that are linked with your asthma. When you have an asthma attack, go back to your asthma diary to see which factor, or combination of factors, might have contributed to it. Once you know what these factors are, you can take steps to control many of them.  Allergies: If you have allergies and asthma, it is important to take asthma prevention steps at home. Asthma attacks (worsening of asthma symptoms) can be triggered by allergies, which can cause temporary increased inflammation of your airways. Minimizing contact with the substance to which you are allergic will help prevent an asthma attack. Animal Dander:   Some people are allergic to the flakes of skin or dried saliva from animals with fur or feathers. Keep these pets out of your home.   If you can't keep a pet outdoors, keep the   pet out of your bedroom and other sleeping areas at all times, and keep the door closed.   Remove carpets and furniture covered with cloth from your home. If that is not possible, keep the pet away from fabric-covered furniture and carpets.  Dust Mites:  Many people with asthma are allergic to dust mites. Dust mites are tiny bugs that are found in  every home, in mattresses, pillows, carpets, fabric-covered furniture, bedcovers, clothes, stuffed toys, fabric, and other fabric-covered items.   Cover your mattress in a special dust-proof cover.   Cover your pillow in a special dust-proof cover, or wash the pillow each week in hot water. Water must be hotter than 130 F to kill dust mites. Cold or warm water used with detergent and bleach can also be effective.   Wash the sheets and blankets on your bed each week in hot water.   Try not to sleep or lie on cloth-covered cushions.   Call ahead when traveling and ask for a smoke-free hotel room. Bring your own bedding and pillows, in case the hotel only supplies feather pillows and down comforters, which may contain dust mites and cause asthma symptoms.   Remove carpets from your bedroom and those laid on concrete, if you can.   Keep stuffed toys out of the bed, or wash the toys weekly in hot water or cooler water with detergent and bleach.  Cockroaches:  Many people with asthma are allergic to the droppings and remains of cockroaches.   Keep food and garbage in closed containers. Never leave food out.   Use poison baits, traps, powders, gels, or paste (for example, boric acid).   If a spray is used to kill cockroaches, stay out of the room until the odor goes away.  Indoor Mold:  Fix leaky faucets, pipes, or other sources of water that have mold around them.   Clean moldy surfaces with a cleaner that has bleach in it.  Pollen and Outdoor Mold:  When pollen or mold spore counts are high, try to keep your windows closed.   Stay indoors with windows closed from late morning to afternoon, if you can. Pollen and some mold spore counts are highest at that time.   Ask your caregiver whether you need to take or increase anti-inflammatory medicine before your allergy season starts.  Irritants:   Tobacco smoke is an irritant. If you smoke, ask your caregiver how you can quit. Ask family  members to quit smoking, too. Do not allow smoking in your home or car.   If possible, do not use a wood-burning stove, kerosene heater, or fireplace. Minimize exposure to all sources of smoke, including incense, candles, fires, and fireworks.   Try to stay away from strong odors and sprays, such as perfume, talcum powder, hair spray, and paints.   Decrease humidity in your home and use an indoor air cleaning device. Reduce indoor humidity to below 60 percent. Dehumidifiers or central air conditioners can do this.   Try to have someone else vacuum for you once or twice a week, if you can. Stay out of rooms while they are being vacuumed and for a short while afterward.   If you vacuum, use a dust mask from a hardware store, a double-layered or microfilter vacuum cleaner bag, or a vacuum cleaner with a HEPA filter.   Sulfites in foods and beverages can be irritants. Do not drink beer or wine, or eat dried fruit, processed potatoes, or shrimp if they cause asthma   symptoms.   Cold air can trigger an asthma attack. Cover your nose and mouth with a scarf on cold or windy days.   Several health conditions can make asthma more difficult to manage, including runny nose, sinus infections, reflux disease, psychological stress, and sleep apnea. Your caregiver will treat these conditions, as well.   Avoid close contact with people who have a cold or the flu, since your asthma symptoms may get worse if you catch the infection from them. Wash your hands thoroughly after touching items that may have been handled by people with a respiratory infection.   Get a flu shot every year to protect against the flu virus, which often makes asthma worse for days or weeks. Also get a pneumonia shot once every five to 10 years.  Drugs:  Aspirin and other painkillers can cause asthma attacks. 10% to 20% of people with asthma have sensitivity to aspirin or a group of painkillers called non-steroidal anti-inflammatory drugs  (NSAIDS), such as ibuprofen and naproxen. These drugs are used to treat pain and reduce fevers. Asthma attacks caused by any of these medicines can be severe and even fatal. These drugs must be avoided in people who have known aspirin sensitive asthma. Products with acetaminophen are considered safe for people who have asthma. It is important that people with aspirin sensitivity read labels of all over-the-counter drugs used to treat pain, colds, coughs, and fever.   Beta blockers and ACE inhibitors are other drugs which you should discuss with your caregiver, in relation to your asthma.  ALLERGY SKIN TESTING  Ask your asthma caregiver about allergy skin testing or blood testing (RAST test) to identify the allergens to which you are sensitive. If you are found to have allergies, allergy shots (immunotherapy) for asthma may help prevent future allergies and asthma. With allergy shots, small doses of allergens (substances to which you are allergic) are injected under your skin on a regular schedule. Over a period of time, your body may become used to the allergen and less responsive with asthma symptoms. You can also take measures to minimize your exposure to those allergens. EXERCISE  If you have exercise-induced asthma, or are planning vigorous exercise, or exercise in cold, humid, or dry environments, prevent exercise-induced asthma by following your caregiver's advice regarding asthma treatment before exercising. Document Released: 11/01/2009 Document Revised: 11/02/2011 Document Reviewed: 11/01/2009 [employer] Patient Information 2012 [employer]. 

## 2012-04-25 NOTE — ED Provider Notes (Signed)
History     CSN: [id number]  Arrival date & time 04/25/12  0520   First MD Initiated Contact with Patient 04/25/12 0532      Chief Complaint  Patient presents with  . Shortness of Breath    (Consider location/radiation/quality/duration/timing/severity/associated sxs/prior treatment) HPI History provided by patient. Has a history of asthma and environmental allergens seemed to be her trigger. She has had some runny nose and congestion over the last few days and takes over-the-counter allergy medication. Tonight she began having some coughing and wheezing and used her inhaler without relief at home. She presents here for further evaluation. No fevers or chills. No productive sputum with dry cough increased at night. No abdominal pain, nausea or vomiting. No chest pain. No sick contacts. At age 31 required admission to the hospital for asthma but has never been intubated. Symptoms moderate in severity, worse with exertion and somewhat relieved by rest. Past Medical History  Diagnosis Date  . PVC (premature ventricular contraction)   . Palpitations   . Pre-syncope   . Tachycardia   . Depression   . Insomnia   . Seasonal allergies   . Asthma     Past Surgical History  Procedure Date  . Cesarean section 10/15/2009, 02/08/2006    times 2    No family history on file.  History  Substance Use Topics  . Smoking status: Never Smoker   . Smokeless tobacco: Not on file  . Alcohol Use: No    OB History    Grav Para Term Preterm Abortions TAB SAB Ect Mult Living                  Review of Systems  Constitutional: Negative for fever and chills.  HENT: Negative for neck pain and neck stiffness.   Eyes: Negative for pain.  Respiratory: Positive for cough and shortness of breath.   Cardiovascular: Negative for chest pain and leg swelling.  Gastrointestinal: Negative for abdominal pain.  Genitourinary: Negative for dysuria.  Musculoskeletal: Negative for back pain.  Skin:  Negative for rash.  Neurological: Negative for headaches.  All other systems reviewed and are negative.    Allergies  Penicillins  Home Medications   Current Outpatient Rx  Name Route Sig Dispense Refill  . ALBUTEROL SULFATE (5 MG/ML) 0.5% IN NEBU Nebulization Take 5 mg by nebulization every 6 (six) hours as needed. For wheeze and shortness of breath    . ALBUTEROL 90 MCG/ACT IN AERS Inhalation Inhale 2 puffs into the lungs every 4 (four) hours as needed. For shortness of breath    . ALPRAZOLAM 0.5 MG PO TABS Oral Take 1 mg by mouth at bedtime as needed. For anxiety    . CITALOPRAM HYDROBROMIDE 40 MG PO TABS Oral Take by mouth daily.      Marland Kitchen FLUTICASONE-SALMETEROL 250-50 MCG/DOSE IN AEPB Inhalation Inhale 1 puff into the lungs every 12 (twelve) hours.      BP 154/91  Pulse 102  Temp 97.5 F (36.4 C)  Resp 20  SpO2 99%  Physical Exam  Constitutional: She is oriented to person, place, and time. She appears well-developed and well-nourished.  HENT:  Head: Normocephalic and atraumatic.  Eyes: Conjunctivae and EOM are normal. Pupils are equal, round, and reactive to light.  Neck: Trachea normal. Neck supple. No thyromegaly present.  Cardiovascular: Normal rate, regular rhythm, S1 normal, S2 normal and normal pulses.     No systolic murmur is present   No diastolic murmur is present  Pulses:      Radial pulses are 2+ on the right side, and 2+ on the left side.  Pulmonary/Chest: She has no rhonchi.       Bilateral expiratory wheezes with prolonged expiration and 2-3 word sentences. Mild tachypnea.  Abdominal: Soft. Normal appearance and bowel sounds are normal. There is no tenderness. There is no CVA tenderness and negative Murphy's sign.  Musculoskeletal:       BLE:s Calves nontender, no cords or erythema, negative Homans sign  Neurological: She is alert and oriented to person, place, and time. She has normal strength. No cranial nerve deficit or sensory deficit. GCS eye subscore  is 4. GCS verbal subscore is 5. GCS motor subscore is 6.  Skin: Skin is warm and dry. No rash noted. She is not diaphoretic.  Psychiatric: Her speech is normal.       Cooperative and appropriate    ED Course  Procedures (including critical care time)  Albuterol nebulizer and prednisone provided.   MDM   Clinical asthma exacerbation. Placed on observation asthma protocol for serial evaluations and repeat breathing treatments as needed.        Sunnie Nielsen, MD 04/25/12 [phone]

## 2012-04-25 NOTE — ED Provider Notes (Signed)
Medical screening examination/treatment/procedure(s) were conducted as a shared visit with non-physician practitioner(s) and myself.  I personally evaluated the patient during the encounter   Sunnie Nielsen, MD 04/25/12 2302

## 2012-04-25 NOTE — ED Notes (Signed)
resp therapy into see pt and do peak flow. Pt refuses to do peak flow.

## 2012-05-03 ENCOUNTER — Ambulatory Visit (INDEPENDENT_AMBULATORY_CARE_PROVIDER_SITE_OTHER): Payer: 59 | Admitting: Physician Assistant

## 2012-05-03 VITALS — BP 126/85 | HR 96 | Temp 98.1°F | Resp 18 | Ht 64.0 in | Wt 156.0 lb

## 2012-05-03 DIAGNOSIS — M62838 Other muscle spasm: Secondary | ICD-10-CM

## 2012-05-03 DIAGNOSIS — S43429A Sprain of unspecified rotator cuff capsule, initial encounter: Secondary | ICD-10-CM

## 2012-05-03 DIAGNOSIS — S46912A Strain of unspecified muscle, fascia and tendon at shoulder and upper arm level, left arm, initial encounter: Secondary | ICD-10-CM

## 2012-05-03 MED ORDER — HYDROCODONE-ACETAMINOPHEN 5-500 MG PO TABS: 1.0000 | ORAL_TABLET | Freq: Three times a day (TID) | ORAL | Status: AC | PRN

## 2012-05-03 MED ORDER — CYCLOBENZAPRINE HCL 5 MG PO TABS: ORAL_TABLET | ORAL | Status: DC

## 2012-05-03 MED ORDER — NAPROXEN 500 MG PO TABS: ORAL_TABLET | ORAL | Status: DC

## 2012-05-03 NOTE — Progress Notes (Signed)
  Subjective:    Patient ID: Kelsey [name], female    DOB: Nov 27, 1981, 31 y.o.   MRN: [id number]  HPI 31 yr old CF presents with L shoulder pain.  This has been a problem now for years.  Flares up every now and then.  She has seen an orthopedist.  She has had injections which have helped at times.  She is finishing a dose pack today for any asthma flareSometimes it awakens her from sleep. NKI. Occasionally the pain requires hydrocodone for relief.  Review of Systems  All other systems reviewed and are negative.       Objective:   Physical Exam  Nursing note and vitals reviewed. Constitutional: She is oriented to person, place, and time. She appears well-developed and well-nourished.  Neck: Normal range of motion. Neck supple.  Cardiovascular: Normal rate, regular rhythm and normal heart sounds.   Pulmonary/Chest: Effort normal and breath sounds normal.  Musculoskeletal: Normal range of motion. She exhibits no edema.       L and R should examined.  TTP over deltoid bursa on L and with external rotation.  But her ROM and strength are WNL.  Neurological: She is alert and oriented to person, place, and time.          Assessment & Plan:  Shoulder strain- see meds.  Hydrocodone very sparingly!!!

## 2012-06-13 ENCOUNTER — Other Ambulatory Visit: Payer: 59

## 2012-06-13 ENCOUNTER — Encounter: Payer: Self-pay | Admitting: Pulmonary Disease

## 2012-06-13 ENCOUNTER — Ambulatory Visit (INDEPENDENT_AMBULATORY_CARE_PROVIDER_SITE_OTHER): Payer: 59 | Admitting: Pulmonary Disease

## 2012-06-13 VITALS — BP 136/82 | HR 82 | Temp 99.5°F | Ht 64.0 in | Wt 155.0 lb

## 2012-06-13 DIAGNOSIS — J45909 Unspecified asthma, uncomplicated: Secondary | ICD-10-CM

## 2012-06-13 DIAGNOSIS — J45901 Unspecified asthma with (acute) exacerbation: Secondary | ICD-10-CM

## 2012-06-13 NOTE — Progress Notes (Signed)
  Subjective:    Patient ID: Kelsey [name], female    DOB: Apr 19, 1981, 31 y.o.   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 She reports mild intermittent symptoms since childhood only requiring albuterol when necessary. She had 3 flares in the last 3 months including a visit to the emergency room in 5/13. She required prednisone tapers on these occasions. Singulair was started on last office visit 04/26/2012 in addition to Advair 250/50 and she reports improvement in her symptoms. She reports nocturnal wheezing requiring use of albuterol. Asthma triggers include heat humidity allergies and cats There is no obvious environmental trigger, she has lived in the same house for the last 5 years. She has a dog for 10 years. She has perennial allergies and took allergy shots until age 28. Her symptoms are not worse at work and she does not describe any triggers at work. She reports anxiety and depression and recent marital stress. He had an emergency room visit in 4/13 for anxiety and tachycardia . She has kids aged 5 and 2. Labs from 2012 are unremarkable. Of note she had a rheumatological evaluation by Dr. Nickola Major, ANA, RA factor was negative sedimentation rate was 3, CPK was 44, thyroid function was normal  Past Medical History  Diagnosis Date  . PVC (premature ventricular contraction)   . Palpitations   . Pre-syncope   . Tachycardia   . Depression   . Insomnia   . Seasonal allergies   . Asthma    Past Surgical History  Procedure Date  . Cesarean section 10/15/2009, 02/08/2006    times 2   Allergies  Allergen Reactions  . Penicillins Rash    History   Social History  . Marital Status: Married    Spouse Name: N/A    Number of Children: 2  . Years of Education: N/A   Occupational History  . respiratory therapists [employer]   Social History Main Topics  . Smoking status: Former Smoker    Types: Cigarettes    . Smokeless tobacco: Not on file   Comment: socially  . Alcohol Use: No  . Drug Use: No  . Sexually Active: Not on file   Other Topics Concern  . Not on file   Social History Narrative  . No narrative on file     Review of Systems  Constitutional: Negative for fever, appetite change and unexpected weight change.  HENT: Positive for congestion and sneezing. Negative for ear pain, sore throat, trouble swallowing and dental problem.   Respiratory: Positive for cough and shortness of breath.   Cardiovascular: Positive for palpitations. Negative for chest pain and leg swelling.  Gastrointestinal: Negative for abdominal pain.  Musculoskeletal: Negative for joint swelling.  Skin: Negative for rash.  Neurological: Negative for headaches.  Psychiatric/Behavioral: Positive for dysphoric mood. The patient is nervous/anxious.        Objective:   Physical Exam  Gen. Pleasant, well-nourished, in no distress, normal affect ENT - no lesions, no post nasal drip Neck: No JVD, no thyromegaly, no carotid bruits Lungs: no use of accessory muscles, no dullness to percussion, clear without rales or rhonchi  Cardiovascular: Rhythm regular, heart sounds  normal, no murmurs or gallops, no peripheral edema Abdomen: soft and non-tender, no hepatosplenomegaly, BS normal. Musculoskeletal: No deformities, no cyanosis or clubbing Neuro:  alert, non focal       Assessment & Plan:

## 2012-06-13 NOTE — Patient Instructions (Signed)
Stay on advair 250/50 twice daily Alternatives include symbicort 160 OR Dulera 200 Stay on singulair daily We discussed self administered taper of prednisone for flare Allergy - blood test today If symptoms persist or worse, will consider empiric Rx for reflux

## 2012-06-13 NOTE — Assessment & Plan Note (Addendum)
Stay on advair 250/50 twice daily Alternatives include symbicort 160 OR Dulera 200 (she will investigate for cost) Stay on singulair daily We discussed self administered taper of prednisone for flare Allergy - RAST today If symptoms persist or worse, will consider empiric Rx for reflux

## 2012-06-14 LAB — ALLERGY FULL PROFILE
Alternaria Alternata: 0.14 kU/L (ref <0.35)
Bahia Grass: 2.97 kU/L — ABNORMAL HIGH (ref <0.35)
Bermuda Grass: 2.18 kU/L — ABNORMAL HIGH (ref <0.35)
Candida Albicans: 0.15 kU/L (ref <0.35)
Cat Dander: 36.4 kU/L — ABNORMAL HIGH (ref <0.35)
Curvularia lunata: 0.11 kU/L (ref <0.35)
Dog Dander: 45.3 kU/L — ABNORMAL HIGH (ref <0.35)
Fescue: 10.1 kU/L — ABNORMAL HIGH (ref <0.35)
Goldenrod: 1.05 kU/L — ABNORMAL HIGH (ref <0.35)
Helminthosporium halodes: 0.11 kU/L (ref <0.35)
Lamb's Quarters: 1.02 kU/L — ABNORMAL HIGH (ref <0.35)
Sycamore Tree: 1.3 kU/L — ABNORMAL HIGH (ref <0.35)

## 2012-06-19 ENCOUNTER — Emergency Department (HOSPITAL_COMMUNITY)
Admission: EM | Admit: 2012-06-19 | Discharge: 2012-06-19 | Disposition: A | Discharge status: Home / Self Care | Payer: 59 | Source: Home / Self Care | Attending: Emergency Medicine | Admitting: Emergency Medicine

## 2012-06-19 ENCOUNTER — Telehealth: Payer: Self-pay | Admitting: Pulmonary Disease

## 2012-06-19 ENCOUNTER — Encounter (HOSPITAL_COMMUNITY): Payer: Self-pay

## 2012-06-19 DIAGNOSIS — M25519 Pain in unspecified shoulder: Secondary | ICD-10-CM

## 2012-06-19 MED ORDER — FLUTICASONE-SALMETEROL 250-50 MCG/DOSE IN AEPB: 1.0000 | INHALATION_SPRAY | Freq: Two times a day (BID) | RESPIRATORY_TRACT | Status: DC

## 2012-06-19 MED ORDER — HYDROCODONE-ACETAMINOPHEN 5-325 MG PO TABS: ORAL_TABLET | ORAL | Status: AC

## 2012-06-19 MED ORDER — TIZANIDINE HCL 4 MG PO TABS: 4.0000 mg | ORAL_TABLET | Freq: Four times a day (QID) | ORAL | Status: AC | PRN

## 2012-06-19 MED ORDER — METHYLPREDNISOLONE ACETATE 40 MG/ML IJ SUSP
INTRAMUSCULAR | Status: AC
  Filled 2012-06-19: qty 5

## 2012-06-19 NOTE — Telephone Encounter (Signed)
I spoke with patient about results and she verbalized understanding and had no questions. She also needed rx for advair sent--this has been sent

## 2012-06-19 NOTE — ED Notes (Signed)
C/o lt shoulder pain since last night.  States this shoulder has bothered her intermittently for 10 years.  States she had xray  Of it one year ago.  No recent injury.  Took flexeril last pm with no relief.

## 2012-06-19 NOTE — Telephone Encounter (Signed)
High titers to cat & dog dander, grass & house dust   [id number] x1

## 2012-06-19 NOTE — ED Provider Notes (Signed)
Chief Complaint  Patient presents with  . 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 The pain is located in the posterior shoulder in the area of the rhomboid muscle. She has seen Dr. Ranell Patrick for this in the past and has been diagnosed as an inflamed rhomboid. She has gotten physical therapy for an injections which do seem to help. She had a flareup of the yesterday. She's not done anything unusual to cause her to flareup. She denies any pain radiating down her arm, numbness, tingling, or weakness. She does have pain with abduction, flexion, and internal and external rotation.  Review of Systems:  Other than noted above, the patient denies any of the following symptoms: Systemic:  No fevers, chills, sweats, or aches.  No fatigue or tiredness. Musculoskeletal:  No joint pain, arthritis, bursitis, swelling, back pain, or neck pain. Neurological:  No muscular weakness, paresthesias, headache, or trouble with speech or coordination.  No dizziness.   PMFSH:  Past medical history, family history, social history, meds, and allergies were reviewed.  Physical Exam:   Vital signs:  BP 128/86  Pulse 87  Temp 98 F (36.7 C) (Oral)  Resp 16  SpO2 95% Gen:  Alert and oriented times 3.  In no distress. Musculoskeletal: There is pain to palpation maximum in the posterior shoulder just medial to the scapula. The shoulder itself had a full range of motion with pain on abduction, flexion, and internal and external rotation. Neer test was positive.  Hawkins test was positive.  Empty cans test was positive. Otherwise, all joints had a full a ROM with no swelling, bruising or deformity.  No edema, pulses full. Extremities were warm and pink.  Capillary refill was brisk.  Skin:  Clear, warm and dry.  No rash. Neuro:  Alert and oriented times 3.  Muscle strength was normal.   Sensation was intact to light touch.   Procedure Note:  Verbal informed consent was obtained from the patient.  Risks and benefits were outlined with the patient.  Patient understands and accepts these risks.  Identity of the patient was confirmed verbally and by armband.    Procedure was performed as followed:  80 point injection was performed as follows. The area of maximal tenderness was identified and marked. The area was then cleansed with alcohol and Betadine and anesthetized with ethyl chloride spray. Using a 1-1/2 inch 27-gauge needle, 1 cc of Depo-Medrol 40 mg strength and 2 cc of Marcaine were injected. The patient tolerated this well with immediate relief of pain. She was given some aftercare instructions about rest and application of ice.  Patient tolerated the procedure well without any immediate complications.  Assessment:  The encounter diagnosis was Shoulder pain. This appears to be muscular pain due to a painful tear point within the rhomboid muscle.  Plan:   1.  The following meds were prescribed:   New Prescriptions   HYDROCODONE-ACETAMINOPHEN (NORCO/VICODIN) 5-325 MG PER TABLET    1 to 2 tabs every 4 to 6 hours as needed for pain.   TIZANIDINE (ZANAFLEX) 4 MG TABLET    Take 1 tablet (4 mg total) by mouth every 6 (six) hours as needed.   2.  The patient was instructed in symptomatic care, including rest and activity, elevation, application of ice and compression.  Appropriate handouts were given. 3.  The patient was  told to return if becoming worse in any way, if no better in 3 or 4 days, and given some red flag symptoms that would indicate earlier return.   4.  The patient was told to follow up with Dr. Ranell Patrick if no better in one week.    Reuben Likes, MD 06/19/12 [phone]

## 2012-06-19 NOTE — Telephone Encounter (Signed)
Returning call can be reached at [phone].Kelsey [name]

## 2012-08-28 ENCOUNTER — Encounter (HOSPITAL_COMMUNITY): Payer: Self-pay

## 2012-08-28 ENCOUNTER — Emergency Department (HOSPITAL_COMMUNITY)
Admission: EM | Admit: 2012-08-28 | Discharge: 2012-08-28 | Disposition: A | Discharge status: Home / Self Care | Payer: 59 | Source: Home / Self Care | Attending: Emergency Medicine | Admitting: Emergency Medicine

## 2012-08-28 DIAGNOSIS — G8929 Other chronic pain: Secondary | ICD-10-CM

## 2012-08-28 DIAGNOSIS — M25519 Pain in unspecified shoulder: Secondary | ICD-10-CM

## 2012-08-28 MED ORDER — CYCLOBENZAPRINE HCL 10 MG PO TABS: 10.0000 mg | ORAL_TABLET | Freq: Two times a day (BID) | ORAL | Status: DC | PRN

## 2012-08-28 MED ORDER — TRAMADOL HCL 50 MG PO TABS: 50.0000 mg | ORAL_TABLET | Freq: Four times a day (QID) | ORAL | Status: DC | PRN

## 2012-08-28 NOTE — ED Provider Notes (Signed)
History     CSN: [id number]  Arrival date & time 08/28/12  0915   First MD Initiated Contact with Patient 08/28/12 [phone]      Chief Complaint  Patient presents with  . Shoulder Pain    (Consider location/radiation/quality/duration/timing/severity/associated sxs/prior treatment) HPI Comments: Patient presents urgent care reporting left shoulder pain that has been recurrent for the last 6 months. She denies any falls or injuries. Describes that she has been discussing and seen in orthopedic Dr. for this pain where she was diagnosed with a muscular problem. She describes that they have done x-rays and there have been no abnormalities. She also underwent physical therapy and trigger pain injections. Occasionally she does go several days without pain but feels that many things can exacerbate the pain. The location of the pain is in the posterior aspect of her left shoulder, exacerbates with activity and movement and direct palpation. She denies any further associated symptoms such as numbness, tingling or weakness of her left upper extremity. Patient also denies constitutional symptoms such as fevers, arthralgias myalgias unintentional weight loss. She has been taking some muscle relaxers and over-the-counter Tylenol as she did not want to take NSAIDs as she was told that related to her asthma would not be good idea to take Aleve for Motrin.   Patient is a 31 y.o. female presenting with shoulder pain. The history is provided by the patient.  Shoulder Pain This is a recurrent problem. The current episode started more than 1 week ago. The problem occurs constantly. The problem has not changed since onset.Pertinent negatives include no chest pain, no abdominal pain, no headaches and no shortness of breath. Exacerbated by: Movement and activity.    Past Medical History  Diagnosis Date  . PVC (premature ventricular contraction)   . Palpitations   . Pre-syncope   . Tachycardia   . Depression   .  Insomnia   . Seasonal allergies   . Asthma     Past Surgical History  Procedure Date  . Cesarean section 10/15/2009, 02/08/2006    times 2    Family History  Problem Relation Age of Onset  . Heart disease Father   . Rheumatologic disease Mother   . Lung cancer Paternal Grandmother     History  Substance Use Topics  . Smoking status: Former Smoker    Types: Cigarettes  . Smokeless tobacco: Not on file   Comment: socially  . Alcohol Use: No    OB History    Grav Para Term Preterm Abortions TAB SAB Ect Mult Living                  Review of Systems  Constitutional: Negative for fever, chills, activity change, appetite change and unexpected weight change.  Respiratory: Negative for apnea, cough, choking and shortness of breath.   Cardiovascular: Negative for chest pain.  Gastrointestinal: Negative for abdominal pain.  Genitourinary: Negative for dysuria.  Musculoskeletal: Negative for back pain and arthralgias.  Skin: Negative for pallor and rash.  Neurological: Negative for dizziness and headaches.    Allergies  Penicillins  Home Medications   Current Outpatient Rx  Name Route Sig Dispense Refill  . ALBUTEROL SULFATE (5 MG/ML) 0.5% IN NEBU Nebulization Take 5 mg by nebulization every 6 (six) hours as needed. For wheeze and shortness of breath    . ALBUTEROL 90 MCG/ACT IN AERS Inhalation Inhale 2 puffs into the lungs every 4 (four) hours as needed. For shortness of breath    .  ALPRAZOLAM 0.5 MG PO TABS Oral Take 1 mg by mouth at bedtime as needed. For anxiety    . CITALOPRAM HYDROBROMIDE 40 MG PO TABS Oral Take by mouth daily.      Marland Kitchen FLUTICASONE-SALMETEROL 250-50 MCG/DOSE IN AEPB Inhalation Inhale 1 puff into the lungs every 12 (twelve) hours. 60 each 5  . MIRTAZAPINE 15 MG PO TABS Oral Take 15 mg by mouth at bedtime.    Marland Kitchen MONTELUKAST SODIUM 10 MG PO TABS Oral Take 10 mg by mouth at bedtime.    . CYCLOBENZAPRINE HCL 10 MG PO TABS Oral Take 1 tablet (10 mg total)  by mouth 2 (two) times daily as needed for muscle spasms. 20 tablet 0  . CYCLOBENZAPRINE HCL 5 MG PO TABS  1 in am, 1 with lunch, and 1-2 tabs hs prn 90 tablet 0  . TRAMADOL HCL 50 MG PO TABS Oral Take 1 tablet (50 mg total) by mouth every 6 (six) hours as needed for pain. 15 tablet 0    BP 129/86  Pulse 90  Temp 98.6 F (37 C) (Oral)  Resp 18  SpO2 99%  Physical Exam  Nursing note and vitals reviewed. Constitutional: She appears well-developed and well-nourished.  Abdominal: Soft.  Musculoskeletal: She exhibits tenderness.       Arms: Neurological: She is alert. She exhibits normal muscle tone.  Skin: Skin is warm. No erythema.    ED Course  Procedures (including critical care time)  Labs Reviewed - No data to display No results found.   1. Chronic shoulder pain       MDM  Patient presents urgent care with ongoing left shoulder pain she has seen her primary care doctor and an orthopedic doctor locally and have undergone physical therapy and localize trigger injections. Patient was prescribed today Ultram and her exam was not consistent with a rotator cuff tear and patient has not experienced any recent injury or fall. I have discussed with patient she is to followup with her primary care Dr. or orthopedic provider to have attended to her previously. She agrees with treatment plan and requested some type of pain medicine and wanted to take some muscle relaxers at night. I have agreed to provide her with both a prescription of Flexeril and tramadol. Patient has a followup appointment next week her primary care Dr.        Jimmie Molly, MD 08/28/12 [phone]

## 2012-08-28 NOTE — ED Notes (Signed)
Reports shoulder pain , no injury , for 6 months plus; has seen ortho, has had trigger paint injection, had x-rays; states was told is a rhomboid muscle problem; NAD

## 2012-09-07 ENCOUNTER — Emergency Department (HOSPITAL_COMMUNITY)
Admission: EM | Admit: 2012-09-07 | Discharge: 2012-09-07 | Disposition: A | Discharge status: Home / Self Care | Payer: 59 | Attending: Emergency Medicine | Admitting: Emergency Medicine

## 2012-09-07 ENCOUNTER — Encounter (HOSPITAL_COMMUNITY): Payer: Self-pay | Admitting: Adult Health

## 2012-09-07 DIAGNOSIS — J45901 Unspecified asthma with (acute) exacerbation: Secondary | ICD-10-CM | POA: Insufficient documentation

## 2012-09-07 DIAGNOSIS — J45909 Unspecified asthma, uncomplicated: Secondary | ICD-10-CM

## 2012-09-07 DIAGNOSIS — Z79899 Other long term (current) drug therapy: Secondary | ICD-10-CM | POA: Insufficient documentation

## 2012-09-07 MED ORDER — METHYLPREDNISOLONE SODIUM SUCC 125 MG IJ SOLR
125.0000 mg | Freq: Once | INTRAMUSCULAR | Status: AC
  Administered 2012-09-07: 125 mg via INTRAVENOUS
  Filled 2012-09-07: qty 2

## 2012-09-07 MED ORDER — PREDNISONE 20 MG PO TABS: 60.0000 mg | ORAL_TABLET | Freq: Every day | ORAL | Status: DC

## 2012-09-07 MED ORDER — LORAZEPAM 1 MG PO TABS
1.0000 mg | ORAL_TABLET | Freq: Once | ORAL | Status: AC
  Administered 2012-09-07: 1 mg via ORAL
  Filled 2012-09-07: qty 2

## 2012-09-07 MED ORDER — IPRATROPIUM BROMIDE 0.02 % IN SOLN
0.5000 mg | Freq: Once | RESPIRATORY_TRACT | Status: AC
  Administered 2012-09-07: 0.5 mg via RESPIRATORY_TRACT
  Filled 2012-09-07: qty 2.5

## 2012-09-07 MED ORDER — ALBUTEROL SULFATE (5 MG/ML) 0.5% IN NEBU
2.5000 mg | INHALATION_SOLUTION | Freq: Once | RESPIRATORY_TRACT | Status: AC
  Administered 2012-09-07: 2.5 mg via RESPIRATORY_TRACT

## 2012-09-07 MED ORDER — ALBUTEROL SULFATE (5 MG/ML) 0.5% IN NEBU
5.0000 mg | INHALATION_SOLUTION | Freq: Once | RESPIRATORY_TRACT | Status: AC
  Administered 2012-09-07: 5 mg via RESPIRATORY_TRACT
  Filled 2012-09-07: qty 0.5

## 2012-09-07 MED ORDER — IPRATROPIUM BROMIDE 0.02 % IN SOLN
0.5000 mg | Freq: Once | RESPIRATORY_TRACT | Status: AC
  Administered 2012-09-07: 0.5 mg via RESPIRATORY_TRACT

## 2012-09-07 NOTE — ED Provider Notes (Signed)
History     CSN: [id number]  Arrival date & time 09/07/12  2048   First MD Initiated Contact with Patient 09/07/12 2102      Chief Complaint  Patient presents with  . Shortness of Breath   HPI  Hx provided by pt.  Pt is a 31 yo female with hx of asthma who presents with asthma attack.  Pt states symptoms began earlier today have been worsening. She has been using home albuterol without significant improvement.  Pt states symptoms have been exacerbated by changing weather.  She denies any recent fever, chills or sweats.  She denies any other complaints.  She has not required any recent admission to the hospital for her asthma as an adult.       Past Medical History  Diagnosis Date  . PVC (premature ventricular contraction)   . Palpitations   . Pre-syncope   . Tachycardia   . Depression   . Insomnia   . Seasonal allergies   . Asthma     Past Surgical History  Procedure Date  . Cesarean section 10/15/2009, 02/08/2006    times 2    Family History  Problem Relation Age of Onset  . Heart disease Father   . Rheumatologic disease Mother   . Lung cancer Paternal Grandmother     History  Substance Use Topics  . Smoking status: Former Smoker    Types: Cigarettes  . Smokeless tobacco: Not on file   Comment: socially  . Alcohol Use: No    OB History    Grav Para Term Preterm Abortions TAB SAB Ect Mult Living                  Review of Systems  Constitutional: Negative for fever and chills.  HENT: Negative for congestion and rhinorrhea.   Respiratory: Positive for cough, shortness of breath and wheezing.   Cardiovascular: Negative for chest pain.    Allergies  Penicillins  Home Medications   Current Outpatient Rx  Name Route Sig Dispense Refill  . ALBUTEROL SULFATE (5 MG/ML) 0.5% IN NEBU Nebulization Take 5 mg by nebulization every 6 (six) hours as needed. For wheeze and shortness of breath    . ALBUTEROL 90 MCG/ACT IN AERS Inhalation Inhale 2 puffs into  the lungs every 4 (four) hours as needed. For shortness of breath    . ALPRAZOLAM 0.5 MG PO TABS Oral Take 1 mg by mouth at bedtime as needed. For anxiety    . CITALOPRAM HYDROBROMIDE 40 MG PO TABS Oral Take by mouth daily.      . CYCLOBENZAPRINE HCL 10 MG PO TABS Oral Take 1 tablet (10 mg total) by mouth 2 (two) times daily as needed for muscle spasms. 20 tablet 0  . CYCLOBENZAPRINE HCL 5 MG PO TABS  1 in am, 1 with lunch, and 1-2 tabs hs prn 90 tablet 0  . FLUTICASONE-SALMETEROL 250-50 MCG/DOSE IN AEPB Inhalation Inhale 1 puff into the lungs every 12 (twelve) hours. 60 each 5  . MIRTAZAPINE 15 MG PO TABS Oral Take 15 mg by mouth at bedtime.    Marland Kitchen MONTELUKAST SODIUM 10 MG PO TABS Oral Take 10 mg by mouth at bedtime.    . TRAMADOL HCL 50 MG PO TABS Oral Take 1 tablet (50 mg total) by mouth every 6 (six) hours as needed for pain. 15 tablet 0    BP 118/80  Pulse 117  Temp 98.7 F (37.1 C) (Oral)  Resp 32  SpO2 100%  Physical Exam  Nursing note and vitals reviewed. Constitutional: She is oriented to person, place, and time. She appears well-developed and well-nourished. No distress.  HENT:  Head: Normocephalic.  Cardiovascular: Regular rhythm.  Tachycardia present.   Pulmonary/Chest: Tachypnea noted. No respiratory distress. She has wheezes. She has no rales.  Musculoskeletal: She exhibits no edema.  Neurological: She is alert and oriented to person, place, and time.  Skin: Skin is warm and dry.  Psychiatric: She has a normal mood and affect. Her behavior is normal.    ED Course  Procedures     1. Asthma       MDM  9:30PM Pt seen and evaluated.  Pt reports having improvement of symptoms after multiple breathing tx.  Will give dose of steroid and continue to monitor.   Pt continues to feel improved.  Wheezing on exam has improved.  Pt feels she can return home and continue tx for asthma there.  Rx for prednisone given.        Angus Seller, [place] 09/08/12 2148

## 2012-09-07 NOTE — Discharge Instructions (Signed)
You were seen and treated for your asthma symptoms. Please continue your home medications and use prednisone for your asthma symptoms. Please followup with your doctor on Monday.   Asthma, Adult Asthma is caused by narrowing of the air passages in the lungs. It may be triggered by pollen, dust, animal dander, molds, some foods, respiratory infections, exposure to smoke, exercise, emotional stress or other allergens (things that cause allergic reactions or allergies). Repeat attacks are common. HOME CARE INSTRUCTIONS   Use prescription medications as ordered by your caregiver.  Avoid pollen, dust, animal dander, molds, smoke and other things that cause attacks at home and at work.  You may have fewer attacks if you decrease dust in your home. Electrostatic air cleaners may help.  It may help to replace your pillows or mattress with materials less likely to cause allergies.  Talk to your caregiver about an action plan for managing asthma attacks at home, including, the use of a peak flow meter which measures the severity of your asthma attack. An action plan can help minimize or stop the attack without having to seek medical care.  If you are not on a fluid restriction, drink 8 to 10 glasses of water each day.  Always have a plan prepared for seeking medical attention, including, calling your physician, accessing local emergency care, and calling 911 (in the [place] for a severe attack.  Discuss possible exercise routines with your caregiver.  If animal dander is the cause of asthma, you may need to get rid of pets. SEEK MEDICAL CARE IF:   You have wheezing and shortness of breath even if taking medicine to prevent attacks.  You have muscle aches, chest pain or thickening of sputum.  Your sputum changes from clear or white to yellow, green, gray, or bloody.  You have any problems that may be related to the medicine you are taking (such as a rash, itching, swelling or trouble  breathing). SEEK IMMEDIATE MEDICAL CARE IF:   Your usual medicines do not stop your wheezing or there is increased coughing and/or shortness of breath.  You have increased difficulty breathing.  You have a fever. MAKE SURE YOU:   Understand these instructions.  Will watch your condition.  Will get help right away if you are not doing well or get worse. Document Released: 11/13/2005 Document Revised: 02/05/2012 Document Reviewed: 07/01/2008 [facility] Patient Information 2013 [place], [place].

## 2012-09-07 NOTE — ED Notes (Signed)
C/o SOB, bilateral breath sounds diminished, SATS 91% RA RR 32, able to speak in short phrases. Placed on albuterol, atrovent treatment.

## 2012-09-09 NOTE — ED Provider Notes (Signed)
Medical screening examination/treatment/procedure(s) were performed by non-physician practitioner and as supervising physician I was immediately available for consultation/collaboration.  Doug Sou, MD 09/09/12 [phone]

## 2012-10-02 ENCOUNTER — Ambulatory Visit (INDEPENDENT_AMBULATORY_CARE_PROVIDER_SITE_OTHER): Payer: 59 | Admitting: Family Medicine

## 2012-10-02 ENCOUNTER — Ambulatory Visit (INDEPENDENT_AMBULATORY_CARE_PROVIDER_SITE_OTHER): Payer: 59 | Admitting: Pulmonary Disease

## 2012-10-02 ENCOUNTER — Encounter: Payer: Self-pay | Admitting: Pulmonary Disease

## 2012-10-02 VITALS — BP 144/82 | HR 107 | Temp 98.0°F | Resp 17 | Ht 64.0 in | Wt 158.0 lb

## 2012-10-02 VITALS — BP 102/72 | HR 98 | Temp 97.8°F | Ht 64.0 in | Wt 162.4 lb

## 2012-10-02 DIAGNOSIS — M549 Dorsalgia, unspecified: Secondary | ICD-10-CM

## 2012-10-02 DIAGNOSIS — M7918 Myalgia, other site: Secondary | ICD-10-CM

## 2012-10-02 DIAGNOSIS — J45901 Unspecified asthma with (acute) exacerbation: Secondary | ICD-10-CM

## 2012-10-02 MED ORDER — METHYLPREDNISOLONE ACETATE 80 MG/ML IJ SUSP
40.0000 mg | Freq: Once | INTRAMUSCULAR | Status: AC
  Administered 2012-10-02: 40 mg via INTRAMUSCULAR

## 2012-10-02 MED ORDER — HYDROCODONE-ACETAMINOPHEN 5-325 MG PO TABS: 1.0000 | ORAL_TABLET | Freq: Four times a day (QID) | ORAL | Status: DC | PRN

## 2012-10-02 MED ORDER — CYCLOBENZAPRINE HCL 5 MG PO TABS: 5.0000 mg | ORAL_TABLET | Freq: Two times a day (BID) | ORAL | Status: DC | PRN

## 2012-10-02 MED ORDER — PREDNISONE 20 MG PO TABS: ORAL_TABLET | ORAL | Status: DC

## 2012-10-02 NOTE — Progress Notes (Signed)
  Subjective:    Patient ID: Kelsey [name], female    DOB: [birth year], 31 y.o.   MRN: [id number]  HPI  Having chronic L shoulder pain for 10+ years. Has appt to see ortho in January. Last night she couldn't sleep and at [facility] they did a trigger point inj.  Was told it was a lax rhomboid muscle and does PT and home exercises w/ some relief.  Just a flair of chronic pain.    Review of Systems     Objective:   Physical Exam  Clear lungs No bony abnormality of swelling Tender 5th left rib under rhomboid muscles After informed consent and sterile prep, 1/2 cc depomedrol and 1 cc marcaine injected (trigger point injection) No complications      Assessment & Plan:  Rhomboid strain

## 2012-10-02 NOTE — Patient Instructions (Addendum)
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 Injections, How and Where to Give, Child - TO - How and Where to Give Insulin Injections, Child Intertrigo-Brief - TO - Intertrigo Jaw Dislocation (Mandibular Dislocation) - TO - Jaw Dislocation Knee Pain, General - TO - Knee Pain Knee Replacement - TO - Knee Replacement, Total Knee Replacement, Care After - TO - Knee Replacement, Total, Care After Laceration Care - TO - Laceration Care, Adult Laceration Care, Easy-to-Read - TO - Laceration Care, Adult, Easy-to-Read Laceration Care, Inside Mouth - TO - Mouth Laceration Laceration Care, Inside Mouth, Easy-to-Read - TO -  Mouth Laceration, Easy-to-Read LEEP (Loop Electrosurgical Excision Procedure) - TO - Loop Electrosurgical Excision Procedure  Lexiscan - TO - Lexiscan Stress Electrocardiography Ligamentous Sprain - TO - Ligament Sprain Marine - Coelenterate Stings - TO - Coelenterate Sting Marine - Sea Urchin Injuries - TO - Sea Urchin Injury Melanoma (Skin Cancer) - TO - Melanoma Menorrhagia (Heavy Periods) - TO - Menorrhagia Mesenteric Ischemia - TO - Acute Mesenteric Ischemia Mononucleosis, Infectious - TO - Infectious Mononucleosis Mononucleosis, Infectious, Easy-to-Read - TO - Infectious Mononucleosis, Easy-to-Read MRSA Infection in Pregnancy - TO - MRSA Infection During Pregnancy Myelogram, Care After, Easy-to-Read - TO - Myelography, Care After, Easy-to-Read Myelogram, Easy-to-Read - TO - Myelography, Easy-to-Read Myelogram, Neuro-Radiology - TO - Myelography Nasal Foreign Body-Brief - TO - Nasal Foreign Body, Easy-to-Read Nuclear Dobutamine Stress Test - TO - Dobutamine Stress Electrocardiographyew Title Oligohydraminos - TO - Oligohydramnios Organ Ultrasound - TO - Abdominal or Pelvic Ultrasound Organ Ultrasound, Easy-to-Read - TO - Abdominal or Pelvic Ultrasound, Easy-to-Read Pain Medications, Generic Instructions For - TO - Pain Medicine Instructions Pain Medications, Generic Instructions For, Easy-to-Read - TO - Pain Medicine Instructions, Easy-to-Read Patent Ductus Arteriosus (PDA) - TO - Patent Ductus Arteriosus Pediatric IV's - TO - Intravenous Catheter, Pediatric Phenylketonuria (PKU) - TO - Phenylketonuria Plasmapheresis, Blood Cleansing - TO - Plasmapheresis, Adult Polycystic Ovarian Syndrome (PCOS) - TO - Polycystic Ovarian Syndrome Polyps, Colon - TO - Colon Polyps Post-Concussion Syndrome, Adult - TO - Post-Concussion Syndrome Post-Concussion Syndrome-Brief - TO - Post-Concussion Syndrome, Easy-to-Read Postpartum Tubal Ligation (PPTL) - TO - Postpartum Tubal Ligation Precautions,  Airborne, Easy-to-Read - TO - Airborne Precautions, Easy-to-Read Precautions, Contact, Easy-to-Read - TO - Contact Precautions, Easy-to-Read Precautions, Droplet, Easy-to-Read - TO - Droplet Precautions, Easy-to-Read Pregnancy - Cytomegalovirus - TO - Cytomegalovirus During Pregnancy Pregnancy - Heartburn - TO - Heartburn During Pregnancy Pregnancy - Heartburn, Easy-to-Read - TO - Heartburn During Pregnancy, Easy-to-Read Premenstrual Syndrome (PMS) - TO - Premenstrual Syndrome   Proctalgia Fugax (Rectal Pain) - TO - Proctalgia Fugax Prolactin Levels PRL's - TO - Prolactin Levels Proteinuria, Protein in the Urine - TO - Proteinuria Pruritic Urticarial Papules and Plaques of Pregnancy (PUPPP) - TO - Pruritic Urticarial Papules and Plaques of Pregnancy Pseudofolliculitis Barbae (Razor Bumps) - TO - Pseudofolliculitis Barbae Psoriasis-Brief - TO - Psoriasis, Easy-to-Read PTSD, Post Traumatic Stress Disorder - TO - Post-Traumatic Stress Disorder Pulmonary Stress Testing - TO - Pulmonary Stress Test Radial Head Fractures, Easy-to-Read - TO - Radial Head Fracture, Easy-to-Read Repetitive Strain Injuries and Trauma Disorders - TO - Repetitive Strain Injuries [place] Fever (RVF) - TO - [place] Fever Sebaceous Cyst-Brief - TO - Epidermal Cyst, Easy-to-Read Sebaceous Cysts - TO - Epidermal Cyst Sepsis - TO - Sepsis, Adult Shingles (Herpes Zoster) - TO - Shingles Spider Bite-Brief - TO - Spider Bite Stress Echocardiogram - TO - Exercise Stress Echocardiography Stress Test, Cardiovascular - TO - Exercise Stress Electrocardiography Stroke, Hemorrhagic - TO - Hemorrhagic Stroke Sub Q Shot From a Bottle, Easy-to-Read - TO - Subcutaneous Injection Using a Syringe and Vial Sub Q Shot Syringe, Easy-to-Read - TO - Subcutaneous Injection Using a Syringe Tethered Spinal Cord Syndrome - TO - Tethered Cord Syndrome Ticks (Deer Tick Bites) - TO - Deer Tick Bite Ticks (Wood Tick Bites) - TO - Wood Tick  Bite Ticks (Wood Tick Bites), Easy-to-Read - TO - Wood Tick Bite, Easy-to-Read Toe Dislocation, Care After - TO - Toe Dislocation Toxocariasis, Roundworm Infection - TO - Toxocariasis Transesophageal Echocardiogram - TO - Transesophageal Echocardiography Vulva Biopsy, Easy-to-Read - TO - Vulva Biopsy, Care After, Easy-to-Read Why You Should Seek Dental Care - TO - Dental Care and Dentist Visits DEACTIVATED TITLES (77) Alcohol Intoxication-Brief ALT, Alanine Aminotransferase Anal Pruritus-Brief Anemia, Iron Deficiency-Brief Appendectomy, Child Appendectomy, Child, Care After Avoiding Insect Bites Biopsy, Discharge Instructions for Bruise, Easy-to-Read Cat Bite, Easy-to-Read Cold Therapy Cold, Common, Adult Cold, Common, Adult, Easy-to-Read Cold, Common, Child Cold, Common, Child, Easy-to-Read Colds and Flu, What to do Contraction Stress Testing Contusions Cough, Bacterial Cough, Generic Cough, Generic, Easy-to-Read Cough, Viral Cough-Brief Cumulative Trauma Disorder Dental Avulsion Diet - Low Cholesterol Guidelines Diet - Low-Fat, Low Saturated Fat, Low Cholesterol Dilation and Curettage, Diagnostic, Care After Dilation or Vacuum Curettage, Miscarriage or Retained Placenta, Care After Dog Bite Dog Bite, Easy-to-Read DT Immunization Dysmenorrhea-Brief Electrocardiography Test Electrocardiography-Brief Electroencephalography Test Electrophysiologic Study Epilepsy-Brief Fetal Contraction Stress Test Fetal Nonstress Test Food Allergies-Brief Hand, Foot, and Mouth Disease-Brief Hypertension in Pregnancy-Brief Hyponatremia-Brief Hysterectomy, Abdominal and Vaginal, Care After Insect Bite or Sting, Infected Insect Sting Allergies Intraoral Laceration-Brief Irukandji Sting Laceration, After Care Low Cholesterol, Low Fat Diet, Easy-to-Read Myelography Neutropenia From Chemotherapy Neutropenia, Causes of Neutropenic Precautions Nonhuman Primate Bite Pain Control  After Surgery, Easy-to-Read Pain, Taking Care of Your Pain at Home, Easy-to-Read Parotitis-Brief Postsurgical Instructions, General, Adult Pregnancy - Nonstress Testing Rectal Bleeding-Brief Repetitive Motion Disorders Safety, Easy-to-Read Scarlet Fever-Brief Shin Splints-Brief Sunburn-Brief Surgical Instructions, Generic Tailbone Injury-Brief Tetanus and Diphtheria Prevention-Brief Tetanus Prevention (Tetanus Toxoid) Tuberculosis-Brief [place], FAQs [place], General Information, Bioterrorism Upper GI and Esophagus Studies Vertigo (Dizziness)-Brief Wound Recheck with Infection Document Released: 08/01/2011 Document Revised: 08/01/2011 Document Reviewed: 08/01/2011 ExitCare Patient Information 2012 [employer]. 

## 2012-10-02 NOTE — Assessment & Plan Note (Signed)
Spirometry pre-post - OK to do this at [facility] Stay on advair & singulair Flonase sample -1 spray each nare at bedtime -call us for Rx if this works Claritin or ZYRTEC daily during spring & fall We discussed prednisone self administration -Rx for prednisone 20 mg tabs x 10 - take 2 tabs daily for flare - call us if you have to use this  Goal would be to decrease flares & ER visits - may have to step up advair, consider xolair or treat reflux if she has another flare in next few months inspite of adequate allergy Rx

## 2012-10-02 NOTE — Progress Notes (Signed)
  Subjective:    Patient ID: Kelsey [name], female    DOB: 1980-12-17, 31 y.o.   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  She reports mild intermittent symptoms since childhood only requiring albuterol when necessary. She had 3 flares in the last 3 months including a visit to the emergency room in 5/13. She required prednisone tapers on these occasions. Singulair was started on last office visit 04/26/2012 in addition to Advair 250/50 and she reports improvement in her symptoms. She reports nocturnal wheezing requiring use of albuterol. Asthma triggers include heat humidity allergies and cats  There is no obvious environmental trigger, she has lived in the same house for the last 5 years. She has a dog for 10 years. She has perennial allergies and took allergy shots until age 81. Her symptoms are not worse at work and she does not describe any triggers at work. She reports anxiety and depression and recent marital stress. He had an emergency room visit in 4/13 for anxiety and tachycardia . She has kids aged 5 and 2.  Labs from 2012 are unremarkable. Of note she had a rheumatological evaluation by Dr. Nickola Major, ANA, RA factor was negative sedimentation rate was 3, CPK was 44, thyroid function was normal Allergy testing - High titers to cat & dog dander, grass & house dust  10/02/2012  pt went to ED 1 month ago for asthma attack. having asthma attacks 1-2 times/month. no cough, wheezing, chest tx Compliant with advair & singulair No nocturnal wheeze  Review of Systems neg for any significant sore throat, dysphagia, itching, sneezing, nasal congestion or excess/ purulent secretions, fever, chills, sweats, unintended wt loss, pleuritic or exertional cp, hempoptysis, orthopnea pnd or change in chronic leg swelling. Also denies presyncope, palpitations, heartburn, abdominal pain, nausea, vomiting, diarrhea or change in bowel or  urinary habits, dysuria,hematuria, rash, arthralgias, visual complaints, headache, numbness weakness or ataxia.     Objective:   Physical Exam  Gen. Pleasant, well-nourished, in no distress ENT - no lesions, no post nasal drip Neck: No JVD, no thyromegaly, no carotid bruits Lungs: no use of accessory muscles, no dullness to percussion, clear without rales or rhonchi  Cardiovascular: Rhythm regular, heart sounds  normal, no murmurs or gallops, no peripheral edema Musculoskeletal: No deformities, no cyanosis or clubbing        Assessment & Plan:

## 2012-10-02 NOTE — Patient Instructions (Addendum)
Spirometry pre-post - OK to do this at [facility] Stay on advair & singulair Flonase sample -1 spray each nare at bedtime -call us for Rx if this works Claritin or ZYRTEC daily during spring & fall We discussed prednisone self administration -Rx for prednisone 20 mg tabs x 10 - take 2 tabs daily for flare - call us if you have to use this

## 2012-10-09 ENCOUNTER — Ambulatory Visit (HOSPITAL_COMMUNITY)
Admission: RE | Admit: 2012-10-09 | Discharge: 2012-10-09 | Disposition: A | Discharge status: Home / Self Care | Payer: 59 | Source: Ambulatory Visit | Attending: Pulmonary Disease | Admitting: Pulmonary Disease

## 2012-10-09 DIAGNOSIS — J45901 Unspecified asthma with (acute) exacerbation: Secondary | ICD-10-CM | POA: Insufficient documentation

## 2012-10-09 LAB — PULMONARY FUNCTION TEST

## 2012-10-09 MED ORDER — ALBUTEROL SULFATE (5 MG/ML) 0.5% IN NEBU
2.5000 mg | INHALATION_SOLUTION | Freq: Once | RESPIRATORY_TRACT | Status: AC
  Administered 2012-10-09: 2.5 mg via RESPIRATORY_TRACT

## 2012-10-16 ENCOUNTER — Encounter (HOSPITAL_COMMUNITY): Payer: 59

## 2012-10-16 ENCOUNTER — Encounter (HOSPITAL_COMMUNITY): Payer: Self-pay

## 2012-10-28 ENCOUNTER — Telehealth: Payer: Self-pay | Admitting: Pulmonary Disease

## 2012-10-28 NOTE — Telephone Encounter (Signed)
spirometry 11/13 - fev1 59% improved ot 70% post BD C/w asthma No change in meds

## 2012-10-29 NOTE — Telephone Encounter (Signed)
lmomtcb x1 

## 2012-10-30 NOTE — Telephone Encounter (Signed)
I spoke with patient about results and she verbalized understanding and had no questions 

## 2013-01-03 ENCOUNTER — Emergency Department (HOSPITAL_BASED_OUTPATIENT_CLINIC_OR_DEPARTMENT_OTHER): Payer: 59

## 2013-01-03 ENCOUNTER — Emergency Department (HOSPITAL_BASED_OUTPATIENT_CLINIC_OR_DEPARTMENT_OTHER)
Admission: EM | Admit: 2013-01-03 | Discharge: 2013-01-03 | Disposition: A | Discharge status: Home / Self Care | Payer: 59 | Attending: Emergency Medicine | Admitting: Emergency Medicine

## 2013-01-03 ENCOUNTER — Encounter (HOSPITAL_BASED_OUTPATIENT_CLINIC_OR_DEPARTMENT_OTHER): Payer: Self-pay

## 2013-01-03 DIAGNOSIS — Y9239 Other specified sports and athletic area as the place of occurrence of the external cause: Secondary | ICD-10-CM | POA: Insufficient documentation

## 2013-01-03 DIAGNOSIS — Y92838 Other recreation area as the place of occurrence of the external cause: Secondary | ICD-10-CM | POA: Insufficient documentation

## 2013-01-03 DIAGNOSIS — J45909 Unspecified asthma, uncomplicated: Secondary | ICD-10-CM | POA: Insufficient documentation

## 2013-01-03 DIAGNOSIS — S52599A Other fractures of lower end of unspecified radius, initial encounter for closed fracture: Secondary | ICD-10-CM | POA: Insufficient documentation

## 2013-01-03 DIAGNOSIS — W219XXA Striking against or struck by unspecified sports equipment, initial encounter: Secondary | ICD-10-CM | POA: Insufficient documentation

## 2013-01-03 DIAGNOSIS — Z79899 Other long term (current) drug therapy: Secondary | ICD-10-CM | POA: Insufficient documentation

## 2013-01-03 DIAGNOSIS — F329 Major depressive disorder, single episode, unspecified: Secondary | ICD-10-CM | POA: Insufficient documentation

## 2013-01-03 DIAGNOSIS — Z8679 Personal history of other diseases of the circulatory system: Secondary | ICD-10-CM | POA: Insufficient documentation

## 2013-01-03 DIAGNOSIS — S52501A Unspecified fracture of the lower end of right radius, initial encounter for closed fracture: Secondary | ICD-10-CM

## 2013-01-03 DIAGNOSIS — F3289 Other specified depressive episodes: Secondary | ICD-10-CM | POA: Insufficient documentation

## 2013-01-03 DIAGNOSIS — F17200 Nicotine dependence, unspecified, uncomplicated: Secondary | ICD-10-CM | POA: Insufficient documentation

## 2013-01-03 DIAGNOSIS — F411 Generalized anxiety disorder: Secondary | ICD-10-CM | POA: Insufficient documentation

## 2013-01-03 DIAGNOSIS — Y9351 Activity, roller skating (inline) and skateboarding: Secondary | ICD-10-CM | POA: Insufficient documentation

## 2013-01-03 MED ORDER — HYDROCODONE-ACETAMINOPHEN 5-325 MG PO TABS
ORAL_TABLET | ORAL | Status: AC
  Administered 2013-01-03: 2 via ORAL
  Filled 2013-01-03: qty 2

## 2013-01-03 MED ORDER — HYDROCODONE-ACETAMINOPHEN 5-325 MG PO TABS: 1.0000 | ORAL_TABLET | Freq: Four times a day (QID) | ORAL | Status: DC | PRN

## 2013-01-03 MED ORDER — MORPHINE SULFATE 4 MG/ML IJ SOLN
4.0000 mg | Freq: Once | INTRAMUSCULAR | Status: AC
  Administered 2013-01-03: 4 mg via INTRAMUSCULAR

## 2013-01-03 MED ORDER — HYDROCODONE-ACETAMINOPHEN 5-325 MG PO TABS
2.0000 | ORAL_TABLET | Freq: Once | ORAL | Status: AC
  Administered 2013-01-03: 2 via ORAL
  Filled 2013-01-03: qty 2

## 2013-01-03 MED ORDER — MORPHINE SULFATE 4 MG/ML IJ SOLN
INTRAMUSCULAR | Status: AC
  Administered 2013-01-03: 4 mg via INTRAMUSCULAR
  Filled 2013-01-03: qty 1

## 2013-01-03 NOTE — ED Provider Notes (Signed)
History  This chart was scribed for Loren Racer, MD by Bennett Scrape, ED Scribe. This patient was seen in room MH10/MH10 and the patient's care was started at 10:11 PM.  CSN: [id number]  Arrival date & time 01/03/13  2157   First MD Initiated Contact with Patient 01/03/13 2211      Chief Complaint  Patient presents with  . Wrist Pain    Patient is a 32 y.o. female presenting with wrist pain. The history is provided by the patient. No language interpreter was used.  Wrist Pain This is a new problem. The current episode started less than 1 hour ago. The problem occurs constantly. The problem has not changed since onset.Pertinent negatives include no headaches.    Kelsey [name] is a 32 y.o. female who presents to the Emergency Department complaining of sudden onset, non-changing, constant right wrist pain with associated swelling that occurred while roller skating less than one hour ago. She states that she was skating with her young child when another child came up behind her and knocked forward over causing her to land on her right wrist. She states that she heard a "crunch" noise at the time. She denies head trauma or LOC. She reports that the pain is worse with movement of her wrist and fingers. She states that she has taken one ibuprofen for the pain with mild improvement. She denies any other injuries or symptoms currently. She has a h/o depression, asthma and anxiety and is a some day smoker but denies alcohol use.  Past Medical History  Diagnosis Date  . PVC (premature ventricular contraction)   . Palpitations   . Pre-syncope   . Tachycardia   . Depression   . Insomnia   . Seasonal allergies   . Asthma   . Anxiety     Past Surgical History  Procedure Date  . Cesarean section 10/15/2009, 02/08/2006    times 2    Family History  Problem Relation Age of Onset  . Heart disease Father   . Rheumatologic disease Mother   . Lung cancer Paternal Grandmother      History  Substance Use Topics  . Smoking status: Current Some Day Smoker    Types: Cigarettes    Last Attempt to Quit: 09/28/2011  . Smokeless tobacco: Not on file     Comment: socially  . Alcohol Use: No    No OB history provided.  Review of Systems  Constitutional: Negative for fever and chills.  Gastrointestinal: Negative for nausea and vomiting.  Musculoskeletal: Negative for back pain.       Positive for right wrist pain  Neurological: Negative for syncope and headaches.  All other systems reviewed and are negative.    Allergies  Penicillins  Home Medications   Current Outpatient Rx  Name  Route  Sig  Dispense  Refill  . ALBUTEROL SULFATE (5 MG/ML) 0.5% IN NEBU   Nebulization   Take 5 mg by nebulization every 6 (six) hours as needed. For wheeze and shortness of breath         . ALBUTEROL 90 MCG/ACT IN AERS   Inhalation   Inhale 2 puffs into the lungs every 4 (four) hours as needed. For shortness of breath         . ALPRAZOLAM 0.5 MG PO TABS   Oral   Take 1 mg by mouth at bedtime as needed. For anxiety         . CITALOPRAM HYDROBROMIDE 10 MG PO TABS  Oral   Take 10 mg by mouth daily.         . CYCLOBENZAPRINE HCL 5 MG PO TABS   Oral   Take 1 tablet (5 mg total) by mouth 2 (two) times daily as needed for muscle spasms. For muscle spasms   60 tablet   0   . FLUTICASONE-SALMETEROL 250-50 MCG/DOSE IN AEPB   Inhalation   Inhale 1 puff into the lungs every 12 (twelve) hours.   60 each   5   . HYDROCODONE-ACETAMINOPHEN 5-325 MG PO TABS   Oral   Take 1 tablet by mouth every 6 (six) hours as needed for pain.   30 tablet   0   . HYDROCODONE-ACETAMINOPHEN 5-325 MG PO TABS   Oral   Take 1 tablet by mouth every 6 (six) hours as needed for pain.   20 tablet   0   . MIRTAZAPINE 15 MG PO TABS   Oral   Take 45 mg by mouth at bedtime.          Marland Kitchen MONTELUKAST SODIUM 10 MG PO TABS   Oral   Take 10 mg by mouth at bedtime.         Marland Kitchen  PREDNISONE 20 MG PO TABS      Take 2 tablets daily x 10 days for flare up   20 tablet   0     Triage Vitals: BP 115/76  Pulse 100  Temp 97.7 F (36.5 C) (Oral)  Resp 16  Ht 5\' 4"  (1.626 m)  Wt 155 lb (70.308 kg)  BMI 26.61 kg/m2  SpO2 100%  Physical Exam  Nursing note and vitals reviewed. Constitutional: She is oriented to person, place, and time. She appears well-developed and well-nourished. No distress.  HENT:  Head: Normocephalic and atraumatic.  Eyes: EOM are normal.  Neck: Neck supple. No tracheal deviation present.  Cardiovascular: Normal rate.   Pulmonary/Chest: Effort normal. No respiratory distress.  Musculoskeletal: She exhibits edema.       Edema to the right wrist, radial pulse and sensation are intact, no tenderness to right elbow or right shoulder, FROM in both  Neurological: She is alert and oriented to person, place, and time.  Skin: Skin is warm and dry.  Psychiatric: She has a normal mood and affect. Her behavior is normal.    ED Course  Procedures (including critical care time)  DIAGNOSTIC STUDIES: Oxygen Saturation is 100% on room air, normal by my interpretation.    COORDINATION OF CARE: 10:18 PM-Advised pt that her wrist is most likely broken and discussed conscious sedation prodecure if needed. Discussed treatment plan which includes pain control and xray of the wrist with pt at bedside and pt agreed to plan.  10:30 PM- Ordered 4 mg morphine injection   Labs Reviewed - No data to display Dg Wrist Complete Right  01/03/2013  *RADIOLOGY REPORT*  Clinical Data: Fall, wrist pain  RIGHT WRIST - COMPLETE 3+ VIEW  Comparison: None.  Findings: Suspected nondisplaced transverse fracture of the distal radius.  The joint spaces are preserved.  Possible mild soft tissue swelling.  IMPRESSION: Suspected nondisplaced transverse fracture of the distal radius.   Original Report Authenticated By: Charline Bills, M.D.      1. Closed fracture of right distal  radius       MDM  I personally performed the services described in this documentation, which was scribed in my presence. The recorded information has been reviewed and is accurate.  Loren Racer, MD 01/03/13 [phone]

## 2013-01-03 NOTE — ED Notes (Signed)
Pt was roller skating with son and fell injuring right wrist.

## 2013-01-30 ENCOUNTER — Ambulatory Visit: Payer: 59 | Admitting: Adult Health

## 2013-02-19 ENCOUNTER — Encounter: Payer: Self-pay | Admitting: Adult Health

## 2013-02-19 ENCOUNTER — Ambulatory Visit (INDEPENDENT_AMBULATORY_CARE_PROVIDER_SITE_OTHER): Payer: 59 | Admitting: Adult Health

## 2013-02-19 VITALS — BP 138/70 | HR 94 | Temp 97.7°F | Ht 64.0 in | Wt 162.4 lb

## 2013-02-19 DIAGNOSIS — J45901 Unspecified asthma with (acute) exacerbation: Secondary | ICD-10-CM

## 2013-02-19 MED ORDER — PREDNISONE 10 MG PO TABS: ORAL_TABLET | ORAL | Status: DC

## 2013-02-19 MED ORDER — AZITHROMYCIN 250 MG PO TABS: ORAL_TABLET | ORAL | Status: AC

## 2013-02-19 MED ORDER — HYDROCODONE-HOMATROPINE 5-1.5 MG/5ML PO SYRP: 5.0000 mL | ORAL_SOLUTION | Freq: Four times a day (QID) | ORAL | Status: DC | PRN

## 2013-02-19 NOTE — Assessment & Plan Note (Signed)
Flare with URI and R. OM   Plan  Z-Pak take as directed. Mucinex DM twice daily as needed. For cough, congestion. Allegra 180 mg daily as needed . For allergy symptoms. Prednisone taper. Over the next week. Hydromet 1-2 teaspoons every 4-6 hours as needed. For cough, may make you sleepy Followup Dr. Vassie Loll in 3-4 months and as needed. Please contact office for sooner follow up if symptoms do not improve or worsen or seek emergency care

## 2013-02-19 NOTE — Progress Notes (Signed)
  Subjective:    Patient ID: Kelsey [name], female    DOB: 07/13/1981, 32 y.o.   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  She reports mild intermittent symptoms since childhood only requiring albuterol when necessary. She had 3 flares in the last 3 months including a visit to the emergency room in 5/13. She required prednisone tapers on these occasions. Singulair was started on last office visit 04/26/2012 in addition to Advair 250/50 and she reports improvement in her symptoms. She reports nocturnal wheezing requiring use of albuterol. Asthma triggers include heat humidity allergies and cats  There is no obvious environmental trigger, she has lived in the same house for the last 5 years. She has a dog for 10 years. She has perennial allergies and took allergy shots until age 18. Her symptoms are not worse at work and she does not describe any triggers at work. She reports anxiety and depression and recent marital stress. He had an emergency room visit in 4/13 for anxiety and tachycardia . She has kids aged 5 and 2.  Labs from 2012 are unremarkable. Of note she had a rheumatological evaluation by Dr. Nickola Major, ANA, RA factor was negative sedimentation rate was 3, CPK was 44, thyroid function was normal Allergy testing - High titers to cat & dog dander, grass & house dust  10/02/12  pt went to ED 1 month ago for asthma attack. having asthma attacks 1-2 times/month. no cough, wheezing, chest tx Compliant with advair & singulair No nocturnal wheeze   02/19/2013 Acute OV  Complains of sore throat, ear ache, cough with thick yellow mucus, wheezing for 1 week.  Daughter and son have URI. Symptoms .  No overt reflux, chest pain or edema.  Has had 1 flare around Dec 2013 , took steroid taper.  Continues on Advair and Singulair  Increased use of SABA last few days.   Review of Systems Constitutional:   No  weight loss, night  sweats,  Fevers, chills, fatigue, or  lassitude.  HEENT:   No headaches,  Difficulty swallowing,  Tooth/dental problems, or  Sore throat,                No sneezing, itching,  +ear ache, nasal congestion, post nasal drip,   CV:  No chest pain,  Orthopnea, PND, swelling in lower extremities, anasarca, dizziness, palpitations, syncope.   GI  No heartburn, indigestion, abdominal pain, nausea, vomiting, diarrhea, change in bowel habits, loss of appetite, bloody stools.   Resp:   No chest wall deformity  Skin: no rash or lesions.  GU: no dysuria, change in color of urine, no urgency or frequency.  No flank pain, no hematuria   MS:  No joint pain or swelling.  No decreased range of motion.  No back pain.  Psych:  No change in mood or affect. No depression or anxiety.  No memory loss.          Objective:   Physical Exam   Gen. Pleasant, well-nourished, in no distress ENT - no lesions, no post nasal drip, R TM bulging  Neck: No JVD, no thyromegaly, no carotid bruits Lungs: no use of accessory muscles, no dullness to percussion, few exp wheezes  Cardiovascular: Rhythm regular, heart sounds  normal, no murmurs or gallops, no peripheral edema Musculoskeletal: No deformities, no cyanosis or clubbing        Assessment & Plan:

## 2013-02-19 NOTE — Patient Instructions (Addendum)
Z-Pak take as directed. Mucinex DM twice daily as needed. For cough, congestion. Allegra 180 mg daily as needed . For allergy symptoms. Prednisone taper. Over the next week. Hydromet 1-2 teaspoons every 4-6 hours as needed. For cough, may make you sleepy Followup Dr. Vassie Loll in 3-4 months and as needed. Please contact office for sooner follow up if symptoms do not improve or worsen or seek emergency care

## 2013-03-12 ENCOUNTER — Other Ambulatory Visit: Payer: Self-pay | Admitting: Pediatrics

## 2013-03-31 ENCOUNTER — Telehealth: Payer: Self-pay | Admitting: Pulmonary Disease

## 2013-03-31 NOTE — Telephone Encounter (Signed)
lmomtcb x1 

## 2013-03-31 NOTE — Telephone Encounter (Signed)
ATC pt. She was breaking in out and could not hear anything. Will await pt call back.

## 2013-04-01 NOTE — Telephone Encounter (Signed)
ATC patient, no answer LMOMTCB 

## 2013-04-02 NOTE — Telephone Encounter (Signed)
LMTCB

## 2013-04-03 NOTE — Telephone Encounter (Signed)
lmom tc x1 

## 2013-04-03 NOTE — Telephone Encounter (Signed)
Called spoke with patient She apologized for not returning call - works at [employer] Pt stated she spoke with a coworker who suggested she try Zyrtec and saline nasal spray Symptoms are improved Advised pt to call if her symptoms do not continue to improve or worsen Nothing further needed at this time Will sign off

## 2013-04-09 ENCOUNTER — Telehealth: Payer: Self-pay | Admitting: Pulmonary Disease

## 2013-04-09 NOTE — Telephone Encounter (Signed)
atc pt na wcb 

## 2013-04-10 MED ORDER — MONTELUKAST SODIUM 10 MG PO TABS: 10.0000 mg | ORAL_TABLET | Freq: Every day | ORAL | Status: DC

## 2013-04-10 MED ORDER — ALBUTEROL SULFATE HFA 108 (90 BASE) MCG/ACT IN AERS: 2.0000 | INHALATION_SPRAY | RESPIRATORY_TRACT | Status: DC | PRN

## 2013-04-10 NOTE — Telephone Encounter (Signed)
LMOM for pt that refills for Albuterol inh and Singulair were sent to [facility] outpatient pharmacy.

## 2013-04-10 NOTE — Telephone Encounter (Signed)
LMOMTCB x 1 - Which albuterol does she need (Neb or inhaler)

## 2013-04-10 NOTE — Telephone Encounter (Signed)
Pt returned triage's call.  Pt states she needs the inhaler.  Kelsey [name]

## 2013-04-24 ENCOUNTER — Telehealth: Payer: Self-pay | Admitting: Pulmonary Disease

## 2013-04-24 NOTE — Telephone Encounter (Signed)
Pt aware no samples available  Nothing further needed She has rx at the pharm

## 2013-04-24 NOTE — Telephone Encounter (Signed)
lmomtcb x1--we have no albuterol samples

## 2013-05-12 ENCOUNTER — Telehealth: Payer: Self-pay | Admitting: Pulmonary Disease

## 2013-05-12 MED ORDER — ALBUTEROL SULFATE HFA 108 (90 BASE) MCG/ACT IN AERS: 2.0000 | INHALATION_SPRAY | Freq: Four times a day (QID) | RESPIRATORY_TRACT | Status: DC | PRN

## 2013-05-12 NOTE — Telephone Encounter (Signed)
Pt aware 1 sample placed upfront for pick up.

## 2013-07-24 ENCOUNTER — Ambulatory Visit (INDEPENDENT_AMBULATORY_CARE_PROVIDER_SITE_OTHER): Payer: 59 | Admitting: Adult Health

## 2013-07-24 ENCOUNTER — Encounter: Payer: Self-pay | Admitting: Adult Health

## 2013-07-24 VITALS — BP 106/68 | HR 102 | Temp 98.8°F | Ht 64.0 in | Wt 161.4 lb

## 2013-07-24 DIAGNOSIS — J45901 Unspecified asthma with (acute) exacerbation: Secondary | ICD-10-CM

## 2013-07-24 MED ORDER — LEVALBUTEROL HCL 0.63 MG/3ML IN NEBU
0.6300 mg | INHALATION_SOLUTION | Freq: Once | RESPIRATORY_TRACT | Status: AC
  Administered 2013-07-24: 0.63 mg via RESPIRATORY_TRACT

## 2013-07-24 MED ORDER — AZITHROMYCIN 250 MG PO TABS: ORAL_TABLET | ORAL | Status: AC

## 2013-07-24 MED ORDER — PREDNISONE 10 MG PO TABS: ORAL_TABLET | ORAL | Status: DC

## 2013-07-24 NOTE — Patient Instructions (Addendum)
Z-Pak take as directed.-to have on hold if symptoms worsen with discolored mucus.  Mucinex DM twice daily as needed. For cough, congestion. Allegra 180 mg daily as needed . For allergy symptoms. Prednisone taper. Over the next week. Nasonex 2 puffs At bedtime  Until sample is gone.  Followup Dr. Vassie Loll in 3-4 months and as needed. Please contact office for sooner follow up if symptoms do not improve or worsen or seek emergency care

## 2013-07-24 NOTE — Addendum Note (Signed)
Addended by: Boone Master E on: 07/24/2013 02:29 PM   Modules accepted: Orders

## 2013-07-24 NOTE — Assessment & Plan Note (Addendum)
Flare with AR  Advised to use antihistamine earlier in allergy season for prevention as indicated.   Plan  Z-Pak take as directed.-to have on hold if symptoms worsen with discolored mucus.  Mucinex DM twice daily as needed. For cough, congestion. Allegra 180 mg daily as needed . For allergy symptoms. Prednisone taper. Over the next week. Nasonex 2 puffs At bedtime  Until sample is gone.  Followup Dr. Vassie Loll in 3-4 months and as needed. Please contact office for sooner follow up if symptoms do not improve or worsen or seek emergency care

## 2013-07-24 NOTE — Progress Notes (Signed)
Subjective:    Patient ID: Kelsey [name], female    DOB: 06-Jun-1981, 32 y.o.   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  She reports mild intermittent symptoms since childhood only requiring albuterol when necessary. She had 3 flares in the last 3 months including a visit to the emergency room in 5/13. She required prednisone tapers on these occasions. Singulair was started on last office visit 04/26/2012 in addition to Advair 250/50 and she reports improvement in her symptoms. She reports nocturnal wheezing requiring use of albuterol. Asthma triggers include heat humidity allergies and cats  There is no obvious environmental trigger, she has lived in the same house for the last 5 years. She has a dog for 10 years. She has perennial allergies and took allergy shots until age 52. Her symptoms are not worse at work and she does not describe any triggers at work. She reports anxiety and depression and recent marital stress. He had an emergency room visit in 4/13 for anxiety and tachycardia . She has kids aged 5 and 2.  Labs from 2012 are unremarkable. Of note she had a rheumatological evaluation by Dr. Nickola Major, ANA, RA factor was negative sedimentation rate was 3, CPK was 44, thyroid function was normal Allergy testing - High titers to cat & dog dander, grass & house dust  10/02/12  pt went to ED 1 month ago for asthma attack. having asthma attacks 1-2 times/month. no cough, wheezing, chest tx Compliant with advair & singulair No nocturnal wheeze   02/19/13  Acute OV  Complains of sore throat, ear ache, cough with thick yellow mucus, wheezing for 1 week.  Daughter and son have URI. Symptoms .  No overt reflux, chest pain or edema.  Has had 1 flare around Dec 2013 , took steroid taper.  Continues on Advair and Singulair  Increased use of SABA last few days.  >>zpack and pred taper   07/24/2013 Acute OV  Complains of  increased SOB, wheezing, chest tightness, prod cough with some yellow mucus, head congestion w/ clear drainage, PND x1 weeks - denies f/c/s Mucus is mainly clear . No fever.  Increased SABA use.  Needs FMLA paperwork filled out.  Doing well until last week with rare use of SABA    Review of Systems Constitutional:   No  weight loss, night sweats,  Fevers, chills, fatigue, or  lassitude.  HEENT:   No headaches,  Difficulty swallowing,  Tooth/dental problems, or  Sore throat,                No sneezing, itching,  +ear ache, nasal congestion, post nasal drip,   CV:  No chest pain,  Orthopnea, PND, swelling in lower extremities, anasarca, dizziness, palpitations, syncope.   GI  No heartburn, indigestion, abdominal pain, nausea, vomiting, diarrhea, change in bowel habits, loss of appetite, bloody stools.   Resp:   No chest wall deformity  Skin: no rash or lesions.  GU: no dysuria, change in color of urine, no urgency or frequency.  No flank pain, no hematuria   MS:  No joint pain or swelling.  No decreased range of motion.  No back pain.  Psych:  No change in mood or affect. No depression or anxiety.  No memory loss.          Objective:   Physical Exam   Gen. Pleasant, well-nourished, in no distress ENT - no lesions,  no post nasal drip, R TM bulging  Neck: No JVD, no thyromegaly, no carotid bruits Lungs: no use of accessory muscles, no dullness to percussion, few exp wheezes  Cardiovascular: Rhythm regular, heart sounds  normal, no murmurs or gallops, no peripheral edema Musculoskeletal: No deformities, no cyanosis or clubbing        Assessment & Plan:

## 2013-08-20 ENCOUNTER — Encounter (HOSPITAL_COMMUNITY): Payer: Self-pay | Admitting: Family Medicine

## 2013-08-20 ENCOUNTER — Inpatient Hospital Stay (HOSPITAL_COMMUNITY)
Admission: EM | Admit: 2013-08-20 | Discharge: 2013-08-22 | DRG: 203 | Disposition: A | Discharge status: Home / Self Care | Payer: 59 | Attending: Internal Medicine | Admitting: Internal Medicine

## 2013-08-20 ENCOUNTER — Emergency Department (HOSPITAL_COMMUNITY): Payer: 59

## 2013-08-20 DIAGNOSIS — Y9210 Unspecified residential institution as the place of occurrence of the external cause: Secondary | ICD-10-CM | POA: Diagnosis not present

## 2013-08-20 DIAGNOSIS — Z79899 Other long term (current) drug therapy: Secondary | ICD-10-CM

## 2013-08-20 DIAGNOSIS — J45901 Unspecified asthma with (acute) exacerbation: Principal | ICD-10-CM | POA: Diagnosis present

## 2013-08-20 DIAGNOSIS — R9431 Abnormal electrocardiogram [ECG] [EKG]: Secondary | ICD-10-CM

## 2013-08-20 DIAGNOSIS — R002 Palpitations: Secondary | ICD-10-CM

## 2013-08-20 DIAGNOSIS — I493 Ventricular premature depolarization: Secondary | ICD-10-CM

## 2013-08-20 DIAGNOSIS — T380X5A Adverse effect of glucocorticoids and synthetic analogues, initial encounter: Secondary | ICD-10-CM | POA: Diagnosis not present

## 2013-08-20 DIAGNOSIS — R000 Tachycardia, unspecified: Secondary | ICD-10-CM | POA: Diagnosis present

## 2013-08-20 DIAGNOSIS — J411 Mucopurulent chronic bronchitis: Secondary | ICD-10-CM | POA: Diagnosis present

## 2013-08-20 DIAGNOSIS — F329 Major depressive disorder, single episode, unspecified: Secondary | ICD-10-CM | POA: Diagnosis present

## 2013-08-20 DIAGNOSIS — Z880 Allergy status to penicillin: Secondary | ICD-10-CM

## 2013-08-20 DIAGNOSIS — F411 Generalized anxiety disorder: Secondary | ICD-10-CM | POA: Diagnosis present

## 2013-08-20 DIAGNOSIS — Z23 Encounter for immunization: Secondary | ICD-10-CM

## 2013-08-20 DIAGNOSIS — K219 Gastro-esophageal reflux disease without esophagitis: Secondary | ICD-10-CM

## 2013-08-20 DIAGNOSIS — D72829 Elevated white blood cell count, unspecified: Secondary | ICD-10-CM | POA: Diagnosis not present

## 2013-08-20 DIAGNOSIS — F3289 Other specified depressive episodes: Secondary | ICD-10-CM | POA: Diagnosis present

## 2013-08-20 DIAGNOSIS — J40 Bronchitis, not specified as acute or chronic: Secondary | ICD-10-CM

## 2013-08-20 LAB — GLUCOSE, CAPILLARY: Glucose-Capillary: 196 mg/dL — ABNORMAL HIGH (ref 70–99)

## 2013-08-20 LAB — CBC
HCT: 42.9 % (ref 36.0–46.0)
Hemoglobin: 14.4 g/dL (ref 12.0–15.0)
Hemoglobin: 13.3 g/dL (ref 12.0–15.0)
MCH: 30.4 pg (ref 26.0–34.0)
MCHC: 33.6 g/dL (ref 30.0–36.0)
MCV: 90.4 fL (ref 78.0–100.0)
RBC: 4.37 MIL/uL (ref 3.87–5.11)
RDW: 12.5 % (ref 11.5–15.5)
WBC: 6.1 10*3/uL (ref 4.0–10.5)
WBC: 6.6 10*3/uL (ref 4.0–10.5)

## 2013-08-20 LAB — BASIC METABOLIC PANEL
BUN: 10 mg/dL (ref 6–23)
CO2: 21 mEq/L (ref 19–32)
Chloride: 104 mEq/L (ref 96–112)
Glucose, Bld: 91 mg/dL (ref 70–99)
Potassium: 3.8 mEq/L (ref 3.5–5.1)

## 2013-08-20 LAB — CREATININE, SERUM
Creatinine, Ser: 0.58 mg/dL (ref 0.50–1.10)
GFR calc Af Amer: >90 mL/min (ref >90)

## 2013-08-20 MED ORDER — LEVALBUTEROL HCL 1.25 MG/3ML IN NEBU
1.2500 mg | INHALATION_SOLUTION | Freq: Once | RESPIRATORY_TRACT | Status: AC
  Administered 2013-08-20: 1.25 mg via RESPIRATORY_TRACT
  Filled 2013-08-20: qty 3

## 2013-08-20 MED ORDER — CITALOPRAM HYDROBROMIDE 10 MG PO TABS
10.0000 mg | ORAL_TABLET | Freq: Every day | ORAL | Status: DC
  Administered 2013-08-21 – 2013-08-22 (×2): 10 mg via ORAL
  Filled 2013-08-20 (×3): qty 1

## 2013-08-20 MED ORDER — INFLUENZA VAC SPLIT QUAD 0.5 ML IM SUSP
0.5000 mL | INTRAMUSCULAR | Status: AC
Start: 2013-08-21 — End: 2013-08-21
  Administered 2013-08-21: 0.5 mL via INTRAMUSCULAR
  Filled 2013-08-20: qty 0.5

## 2013-08-20 MED ORDER — GUAIFENESIN-DM 100-10 MG/5ML PO SYRP: 5.0000 mL | ORAL_SOLUTION | ORAL | Status: DC | PRN

## 2013-08-20 MED ORDER — METHYLPREDNISOLONE SODIUM SUCC 125 MG IJ SOLR
60.0000 mg | Freq: Four times a day (QID) | INTRAMUSCULAR | Status: DC
  Administered 2013-08-20 – 2013-08-21 (×4): 60 mg via INTRAVENOUS
  Filled 2013-08-20 (×8): qty 0.96

## 2013-08-20 MED ORDER — LEVOFLOXACIN IN D5W 750 MG/150ML IV SOLN
750.0000 mg | INTRAVENOUS | Status: DC
  Administered 2013-08-20 – 2013-08-21 (×2): 750 mg via INTRAVENOUS
  Filled 2013-08-20 (×3): qty 150

## 2013-08-20 MED ORDER — ONDANSETRON HCL 4 MG/2ML IJ SOLN: 4.0000 mg | Freq: Four times a day (QID) | INTRAMUSCULAR | Status: DC | PRN

## 2013-08-20 MED ORDER — IPRATROPIUM BROMIDE 0.02 % IN SOLN
0.5000 mg | Freq: Once | RESPIRATORY_TRACT | Status: AC
  Administered 2013-08-20: 0.5 mg via RESPIRATORY_TRACT
  Filled 2013-08-20: qty 2.5

## 2013-08-20 MED ORDER — ALBUTEROL (5 MG/ML) CONTINUOUS INHALATION SOLN
10.0000 mg/h | INHALATION_SOLUTION | RESPIRATORY_TRACT | Status: DC
  Administered 2013-08-20: 10 mg/h via RESPIRATORY_TRACT
  Filled 2013-08-20: qty 20

## 2013-08-20 MED ORDER — ACETAMINOPHEN 325 MG PO TABS: 650.0000 mg | ORAL_TABLET | Freq: Four times a day (QID) | ORAL | Status: DC | PRN

## 2013-08-20 MED ORDER — ALPRAZOLAM 0.5 MG PO TABS
1.0000 mg | ORAL_TABLET | Freq: Two times a day (BID) | ORAL | Status: DC | PRN
  Administered 2013-08-20 – 2013-08-21 (×2): 1 mg via ORAL
  Filled 2013-08-20 (×2): qty 2

## 2013-08-20 MED ORDER — LEVALBUTEROL HCL 0.63 MG/3ML IN NEBU
0.6300 mg | INHALATION_SOLUTION | RESPIRATORY_TRACT | Status: DC | PRN
  Administered 2013-08-21 – 2013-08-22 (×2): 0.63 mg via RESPIRATORY_TRACT
  Filled 2013-08-20 (×2): qty 3

## 2013-08-20 MED ORDER — ALBUTEROL SULFATE (5 MG/ML) 0.5% IN NEBU: 2.5000 mg | INHALATION_SOLUTION | RESPIRATORY_TRACT | Status: DC | PRN

## 2013-08-20 MED ORDER — MIRTAZAPINE 45 MG PO TABS
45.0000 mg | ORAL_TABLET | Freq: Every day | ORAL | Status: DC
  Administered 2013-08-20 – 2013-08-21 (×2): 45 mg via ORAL
  Filled 2013-08-20 (×3): qty 1

## 2013-08-20 MED ORDER — LEVALBUTEROL HCL 0.63 MG/3ML IN NEBU
0.6300 mg | INHALATION_SOLUTION | Freq: Four times a day (QID) | RESPIRATORY_TRACT | Status: DC
  Administered 2013-08-20 – 2013-08-21 (×4): 0.63 mg via RESPIRATORY_TRACT
  Filled 2013-08-20 (×9): qty 3

## 2013-08-20 MED ORDER — CYCLOBENZAPRINE HCL 5 MG PO TABS
5.0000 mg | ORAL_TABLET | Freq: Two times a day (BID) | ORAL | Status: DC | PRN
  Administered 2013-08-20 – 2013-08-21 (×2): 5 mg via ORAL
  Filled 2013-08-20 (×3): qty 1

## 2013-08-20 MED ORDER — PANTOPRAZOLE SODIUM 40 MG PO TBEC
40.0000 mg | DELAYED_RELEASE_TABLET | Freq: Every day | ORAL | Status: DC
  Administered 2013-08-21: 40 mg via ORAL
  Filled 2013-08-20: qty 1

## 2013-08-20 MED ORDER — MOMETASONE FURO-FORMOTEROL FUM 100-5 MCG/ACT IN AERO
2.0000 | INHALATION_SPRAY | Freq: Two times a day (BID) | RESPIRATORY_TRACT | Status: DC
  Administered 2013-08-20 – 2013-08-22 (×4): 2 via RESPIRATORY_TRACT
  Filled 2013-08-20: qty 8.8

## 2013-08-20 MED ORDER — ACETAMINOPHEN 650 MG RE SUPP: 650.0000 mg | Freq: Four times a day (QID) | RECTAL | Status: DC | PRN

## 2013-08-20 MED ORDER — IPRATROPIUM BROMIDE 0.02 % IN SOLN
0.5000 mg | Freq: Four times a day (QID) | RESPIRATORY_TRACT | Status: DC
  Administered 2013-08-20 – 2013-08-21 (×4): 0.5 mg via RESPIRATORY_TRACT
  Filled 2013-08-20 (×4): qty 2.5

## 2013-08-20 MED ORDER — MONTELUKAST SODIUM 10 MG PO TABS
10.0000 mg | ORAL_TABLET | Freq: Every day | ORAL | Status: DC
  Administered 2013-08-20 – 2013-08-21 (×2): 10 mg via ORAL
  Filled 2013-08-20 (×3): qty 1

## 2013-08-20 MED ORDER — ONDANSETRON HCL 4 MG PO TABS: 4.0000 mg | ORAL_TABLET | Freq: Four times a day (QID) | ORAL | Status: DC | PRN

## 2013-08-20 MED ORDER — SODIUM CHLORIDE 0.9 % IJ SOLN
3.0000 mL | Freq: Two times a day (BID) | INTRAMUSCULAR | Status: DC
  Administered 2013-08-20 – 2013-08-22 (×3): 3 mL via INTRAVENOUS

## 2013-08-20 MED ORDER — ENOXAPARIN SODIUM 40 MG/0.4ML ~~LOC~~ SOLN
40.0000 mg | SUBCUTANEOUS | Status: DC
  Filled 2013-08-20 (×3): qty 0.4

## 2013-08-20 MED ORDER — METHYLPREDNISOLONE SODIUM SUCC 125 MG IJ SOLR
125.0000 mg | Freq: Once | INTRAMUSCULAR | Status: AC
  Administered 2013-08-20: 125 mg via INTRAVENOUS
  Filled 2013-08-20: qty 2

## 2013-08-20 NOTE — ED Notes (Signed)
Per pt sts sick x 1 week. sts productive cough with hx of astma

## 2013-08-20 NOTE — H&P (Signed)
History and Physical       Hospital Admission Note Date: 08/20/2013  Patient name: Kelsey [name] Medical record number: [id number] Date of birth: July 22, 1981 Age: 32 y.o. 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 Patient reported that she was having nasal congestion, cold, sore throat for the last 1 week,  she was using her albuterol inhaler every night. However this morning, when she was getting ready to come to work, around 6 AM, her breathing became worse with wheezing. She used her albuterol inhaler with no significant improvement. She also noticed that she was coughing a lot with the yellowish phlegm, but no fevers or chills. She felt chest tightness worse with deep breathing. Patient received prolonged albuterol nebulizer treatment with IV steroids, wheezing has somewhat improved. She has tachycardia due to nebulizer treatment, HR was 140s when she got up to use the restroom.  Review of Systems:  Constitutional: Denies fever, chills, diaphoresis, poor appetite and fatigue.  HEENT: Denies photophobia, eye pain, redness, hearing loss, ear pain, + congestion, sore throat, rhinorrhea, sneezing for last 1 week. denies mouth sores, trouble swallowing, neck pain, neck stiffness and tinnitus.   Respiratory: see HPI Cardiovascular: Denies chest pain, palpitations and leg swelling.  Gastrointestinal: Denies nausea, vomiting, abdominal pain, diarrhea, constipation, blood in stool and abdominal distention.  Genitourinary: Denies dysuria, urgency, frequency, hematuria, flank pain and difficulty urinating.  Musculoskeletal: Denies myalgias, back pain, joint swelling, arthralgias and gait problem.  Skin: Denies pallor, rash and  wound.  Neurological: Denies dizziness, seizures, syncope, weakness, light-headedness, numbness and headaches.  Hematological: Denies adenopathy. Easy bruising, personal or family bleeding history  Psychiatric/Behavioral: Denies suicidal ideation, mood changes, confusion, nervousness, sleep disturbance and agitation  Past Medical History: Past Medical History  Diagnosis Date  . PVC (premature ventricular contraction)   . Palpitations   . Pre-syncope   . Tachycardia   . Depression   . Insomnia   . Seasonal allergies   . Asthma   . Anxiety    Past Surgical History  Procedure Laterality Date  . Cesarean section  10/15/2009, 02/08/2006    times 2    Medications: Prior to Admission medications   Medication Sig Start Date End Date Taking? Authorizing Provider  albuterol (PROVENTIL HFA;VENTOLIN HFA) 108 (90 BASE) MCG/ACT inhaler Inhale 2 puffs into the lungs every 4 (four) hours as needed (For shortness of breath). 04/10/13  Yes Oretha Milch, MD  albuterol (PROVENTIL) (5 MG/ML) 0.5% nebulizer solution Take 5 mg by nebulization every 6 (six) hours as needed. For wheeze and shortness of breath   Yes Historical Provider, MD  ALPRAZolam (XANAX) 0.5 MG tablet Take 1 mg by mouth at bedtime as needed. For anxiety   Yes Historical Provider, MD  citalopram (CELEXA) 10 MG tablet Take 10 mg by mouth daily.   Yes Historical Provider, MD  cyclobenzaprine (FLEXERIL) 5 MG tablet Take 1 tablet (5 mg total) by mouth 2 (two) times daily as needed for muscle spasms. For muscle spasms 10/02/12  Yes Sherren Mocha, MD  Fluticasone-Salmeterol (ADVAIR) 250-50 MCG/DOSE AEPB Inhale 1 puff into the lungs every 12 (twelve) hours. 06/19/12  Yes Oretha Milch, MD  levonorgestrel (MIRENA) 20 MCG/24HR IUD 1 each by Intrauterine route once.   Yes Historical Provider, MD  mirtazapine (REMERON) 15  MG tablet Take 45 mg by mouth at bedtime.    Yes Historical Provider, MD  montelukast (SINGULAIR) 10 MG tablet Take 1 tablet (10 mg  total) by mouth at bedtime. 04/10/13  Yes Oretha Milch, MD    Allergies:   Allergies  Allergen Reactions  . Penicillins Rash    Social History:  reports that she has never smoked. She does not have any smokeless tobacco history on file. She reports that she does not drink alcohol or use illicit drugs.  Family History: Family History  Problem Relation Age of Onset  . Heart disease Father   . Rheumatologic disease Mother   . Lung cancer Paternal Grandmother     Physical Exam: Blood pressure 140/96, pulse 119, temperature 97.8 F (36.6 C), resp. rate 24, SpO2 100.00%. General: Alert, awake, oriented x3, in no acute distress. HEENT: normocephalic, atraumatic, anicteric sclera, pink conjunctiva, pupils equal and reactive to light and accomodation, oropharynx clear Neck: supple, no masses or lymphadenopathy, no goiter, no bruits  Heart: Regular rate and rhythm, without murmurs, rubs or gallops, tachycardia. Lungs: b/l expiratory wheezing  Abdomen: Soft, nontender, nondistended, positive bowel sounds, no masses. Extremities: No clubbing, cyanosis or edema with positive pedal pulses. Neuro: Grossly intact, no focal neurological deficits, strength 5/5 upper and lower extremities bilaterally Psych: alert and oriented x 3, normal mood and affect Skin: no rashes or lesions, warm and dry   LABS on Admission:  Basic Metabolic Panel:  Recent Labs Lab 08/20/13 0750  NA 137  K 3.8  CL 104  CO2 21  GLUCOSE 91  BUN 10  CREATININE 0.69  CALCIUM 9.3   Liver Function Tests: No results found for this basename: AST, ALT, ALKPHOS, BILITOT, PROT, ALBUMIN,  in the last 168 hours No results found for this basename: LIPASE, AMYLASE,  in the last 168 hours No results found for this basename: AMMONIA,  in the last 168 hours CBC:  Recent Labs Lab 08/20/13 0750  WBC 6.1  HGB 14.4  HCT 42.9  MCV 91.1  PLT 361   Cardiac Enzymes: No results found for this basename: CKTOTAL, CKMB,  CKMBINDEX, TROPONINI,  in the last 168 hours BNP: No components found with this basename: POCBNP,  CBG: No results found for this basename: GLUCAP,  in the last 168 hours   Radiological Exams on Admission: Dg Chest Port 1 View  08/20/2013   *RADIOLOGY REPORT*  Clinical Data: Cough, shortness of breath, asthma, initial encounter.  PORTABLE CHEST - 1 VIEW  Comparison: 02/10/2012  Findings: Grossly unchanged cardiac silhouette and mediastinal contours.  Improved aeration of the right infrahilar lung.  Lungs appear mildly hyperexpanded with mild diffuse slightly nodular thickening of the interstitium.  No focal airspace opacity.  No pleural effusion or pneumothorax.  No evidence of edema.  Grossly unchanged bones including mild scoliotic curvature of the thoracic spine, possibly positional.  IMPRESSION: Mild lung hyperexpansion and bronchitic change without acute cardiopulmonary disease.   Original Report Authenticated By: Tacey Ruiz, MD    Assessment/Plan Principal Problem:   Asthma exacerbation: Known history of asthma, follows pulmonology Outpatient - Admit for observation, placed on scheduled to Xopenex and Atrovent Nebs, Dulera, IV Solu Medrol 60 mg every 6 hours, flutter valve - She's allergic to penicillin, place on IV Levaquin daily  - Placed on PPI, Robitussin as needed - Continue Xanax as needed, Remeron   Active Problems:   Tachycardia: Sinus - Changed nebulizer treatment to Xopenex  DVT prophylaxis: Lovenox  CODE  STATUS: Full code  Further plan will depend as patient's clinical course evolves and further radiologic and laboratory data become available. Dr Nehemiah Settle (patient's PCP will assume care in AM)  Time Spent on Admission: 45 minutes  RAI,RIPUDEEP M.D. [facility] 08/20/2013, 11:18 AM Pager: [phone]  If 7PM-7AM, please contact night-coverage [email or web address] Password [email or web address]

## 2013-08-20 NOTE — ED Notes (Signed)
Report called to RN.

## 2013-08-20 NOTE — ED Provider Notes (Signed)
CSN: [id number]     Arrival date & time 08/20/13  7829 History   First MD Initiated Contact with Patient 08/20/13 [phone]     Chief Complaint  Patient presents with  . Asthma  . Shortness of Breath   (Consider location/radiation/quality/duration/timing/severity/associated sxs/prior Treatment) HPI Patient works as a Buyer, retail. She's been having a productive cough with yellow sputum for the past week just complains of nasal congestion and wheezing. She's been using her rescue inhaler and last used it this morning before coming into work. She states she was coughing a lot last night started having sharp chest pains. The chest pain is not located more on the right anterior chest. It is worse with deep breath and palpation. Denies any lower extremity pain or swelling. She denies fevers or chills.  Past Medical History  Diagnosis Date  . PVC (premature ventricular contraction)   . Palpitations   . Pre-syncope   . Tachycardia   . Depression   . Insomnia   . Seasonal allergies   . Asthma   . Anxiety    Past Surgical History  Procedure Laterality Date  . Cesarean section  10/15/2009, 02/08/2006    times 2   Family History  Problem Relation Age of Onset  . Heart disease Father   . Rheumatologic disease Mother   . Lung cancer Paternal Grandmother    History  Substance Use Topics  . Smoking status: Never Smoker   . Smokeless tobacco: Not on file     Comment: reports "tried it as a teenager" but has not smoked since  . Alcohol Use: No   OB History   Grav Para Term Preterm Abortions TAB SAB Ect Mult Living                 Review of Systems  Constitutional: Negative for fever and chills.  HENT: Positive for congestion. Negative for sore throat, rhinorrhea and neck pain.   Respiratory: Positive for cough, shortness of breath and wheezing.   Cardiovascular: Positive for chest pain. Negative for palpitations and leg swelling.  Gastrointestinal: Negative for nausea, vomiting  and abdominal pain.  Genitourinary: Negative for dysuria.  Musculoskeletal: Negative for back pain.  Skin: Negative for pallor, rash and wound.  Neurological: Negative for dizziness, weakness, numbness and headaches.  All other systems reviewed and are negative.    Allergies  Penicillins  Home Medications   Current Outpatient Rx  Name  Route  Sig  Dispense  Refill  . albuterol (PROVENTIL HFA;VENTOLIN HFA) 108 (90 BASE) MCG/ACT inhaler   Inhalation   Inhale 2 puffs into the lungs every 4 (four) hours as needed (For shortness of breath).   1 Inhaler   6   . albuterol (PROVENTIL) (5 MG/ML) 0.5% nebulizer solution   Nebulization   Take 5 mg by nebulization every 6 (six) hours as needed. For wheeze and shortness of breath         . ALPRAZolam (XANAX) 0.5 MG tablet   Oral   Take 1 mg by mouth at bedtime as needed. For anxiety         . citalopram (CELEXA) 10 MG tablet   Oral   Take 10 mg by mouth daily.         . cyclobenzaprine (FLEXERIL) 5 MG tablet   Oral   Take 1 tablet (5 mg total) by mouth 2 (two) times daily as needed for muscle spasms. For muscle spasms   60 tablet   0   .  Fluticasone-Salmeterol (ADVAIR) 250-50 MCG/DOSE AEPB   Inhalation   Inhale 1 puff into the lungs every 12 (twelve) hours.   60 each   5   . mirtazapine (REMERON) 15 MG tablet   Oral   Take 45 mg by mouth at bedtime.          . montelukast (SINGULAIR) 10 MG tablet   Oral   Take 1 tablet (10 mg total) by mouth at bedtime.   30 tablet   6   . predniSONE (DELTASONE) 10 MG tablet      4 tabs for 2 days, then 3 tabs for 2 days, 2 tabs for 2 days, then 1 tab for 2 days, then stop   20 tablet   0    BP 124/92  Pulse 120  Temp(Src) 97.8 F (36.6 C)  Resp 24  SpO2 95% Physical Exam  Nursing note and vitals reviewed. Constitutional: She is oriented to person, place, and time. She appears well-developed and well-nourished. No distress.  HENT:  Head: Normocephalic and  atraumatic.  Mouth/Throat: Oropharynx is clear and moist. No oropharyngeal exudate.  Eyes: EOM are normal. Pupils are equal, round, and reactive to light.  Neck: Normal range of motion. Neck supple.  Cardiovascular: Regular rhythm.   Tachycardia.  Pulmonary/Chest: No respiratory distress. She has wheezes (end expiratory wheezing in all lung fields). She has no rales.  Mild increased effort.  Abdominal: Soft. Bowel sounds are normal. She exhibits no distension and no mass. There is no tenderness. There is no rebound and no guarding.  Musculoskeletal: Normal range of motion. She exhibits no edema and no tenderness.  No calf swelling or tenderness.  Neurological: She is alert and oriented to person, place, and time.  Meds Ultram is without deficit. Sensation grossly intact.  Skin: Skin is warm and dry. No rash noted. No erythema.  Psychiatric: She has a normal mood and affect. Her behavior is normal.    ED Course  Procedures (including critical care time) Labs Review Labs Reviewed  CBC  BASIC METABOLIC PANEL  HCG, SERUM, QUALITATIVE   Imaging Review No results found.  MDM   Patient with increased wheezing throughout after her continuous nebulized treatment. She is having dyspnea with any exertion. She speaking in full sentences. I discussed with [facility] hospitalist who will admit her for asthma exacerbation.  Loren Racer, MD 08/20/13 1038

## 2013-08-21 DIAGNOSIS — R002 Palpitations: Secondary | ICD-10-CM

## 2013-08-21 DIAGNOSIS — J40 Bronchitis, not specified as acute or chronic: Secondary | ICD-10-CM

## 2013-08-21 LAB — CBC
Hemoglobin: 13.3 g/dL (ref 12.0–15.0)
MCH: 31.1 pg (ref 26.0–34.0)
MCHC: 34.1 g/dL (ref 30.0–36.0)
Platelets: 424 10*3/uL — ABNORMAL HIGH (ref 150–400)
RBC: 4.28 MIL/uL (ref 3.87–5.11)
WBC: 21.6 10*3/uL — ABNORMAL HIGH (ref 4.0–10.5)

## 2013-08-21 LAB — BASIC METABOLIC PANEL
CO2: 20 mEq/L (ref 19–32)
Calcium: 9.4 mg/dL (ref 8.4–10.5)
GFR calc Af Amer: >90 mL/min (ref >90)
GFR calc non Af Amer: >90 mL/min (ref >90)
Glucose, Bld: 133 mg/dL — ABNORMAL HIGH (ref 70–99)
Potassium: 4.5 mEq/L (ref 3.5–5.1)
Sodium: 136 mEq/L (ref 135–145)

## 2013-08-21 MED ORDER — MENTHOL 3 MG MT LOZG
1.0000 | LOZENGE | OROMUCOSAL | Status: DC | PRN
  Filled 2013-08-21: qty 9

## 2013-08-21 MED ORDER — METHYLPREDNISOLONE SODIUM SUCC 40 MG IJ SOLR
40.0000 mg | Freq: Three times a day (TID) | INTRAMUSCULAR | Status: DC
  Administered 2013-08-21 – 2013-08-22 (×3): 40 mg via INTRAVENOUS
  Filled 2013-08-21 (×6): qty 1

## 2013-08-21 MED ORDER — PANTOPRAZOLE SODIUM 40 MG PO TBEC
40.0000 mg | DELAYED_RELEASE_TABLET | Freq: Two times a day (BID) | ORAL | Status: DC
  Administered 2013-08-21 – 2013-08-22 (×2): 40 mg via ORAL
  Filled 2013-08-21 (×2): qty 1

## 2013-08-21 MED ORDER — BENZONATATE 100 MG PO CAPS
100.0000 mg | ORAL_CAPSULE | Freq: Three times a day (TID) | ORAL | Status: DC
  Administered 2013-08-21 – 2013-08-22 (×4): 100 mg via ORAL
  Filled 2013-08-21 (×6): qty 1

## 2013-08-21 MED ORDER — IPRATROPIUM BROMIDE 0.02 % IN SOLN
0.5000 mg | Freq: Three times a day (TID) | RESPIRATORY_TRACT | Status: DC
  Administered 2013-08-21 – 2013-08-22 (×2): 0.5 mg via RESPIRATORY_TRACT
  Filled 2013-08-21 (×2): qty 2.5

## 2013-08-21 MED ORDER — LEVALBUTEROL HCL 0.63 MG/3ML IN NEBU
0.6300 mg | INHALATION_SOLUTION | Freq: Three times a day (TID) | RESPIRATORY_TRACT | Status: DC
  Administered 2013-08-21 – 2013-08-22 (×2): 0.63 mg via RESPIRATORY_TRACT
  Filled 2013-08-21 (×4): qty 3

## 2013-08-21 MED ORDER — ALBUTEROL SULFATE HFA 108 (90 BASE) MCG/ACT IN AERS
2.0000 | INHALATION_SPRAY | RESPIRATORY_TRACT | Status: DC | PRN
  Filled 2013-08-21: qty 6.7

## 2013-08-21 NOTE — Progress Notes (Signed)
Patient ID: Kelsey [name]  female  ZOX:[id number]    DOB: June 29, 1981    DOA: 08/20/2013  PCP: Katy Apo, MD  Assessment/Plan: Principal Problem:   Asthma exacerbation: Improving - Continue Xopenex and Atrovent Nebs, Dulera,  - IV Solu Medrol tapered to 40 mg q. 8 hours, transition to prednisone tomorrow  - Continue flutter valve, Levaquin  - Placed on PPI for GI prophylaxis - Placed on Tessalon Perles, Cepacol, Robitussin as needed  - Continue Xanax as needed, Remeron   Active Problems:   Tachycardia: Improved after starting on Xopenex  DVT Prophylaxis:  Code Status:  Disposition: Hopefully DC home in a.m.    Subjective: Still somewhat winded on ambulating  Objective: Weight change:   Intake/Output Summary (Last 24 hours) at 08/21/13 1202 Last data filed at 08/21/13 0834  Gross per 24 hour  Intake    300 ml  Output      0 ml  Net    300 ml   Blood pressure 119/76, pulse 95, temperature 97.6 F (36.4 C), temperature source Oral, resp. rate 18, height 5\' 4"  (1.626 m), weight 71.895 kg (158 lb 8 oz), SpO2 95.00%.  Physical Exam: General: Alert and awake, oriented x3, not in any acute distress. CVS: S1-S2 clear, no murmur rubs or gallops Chest: Mild scattered wheezing  Abdomen: soft nontender, nondistended, normal bowel sounds  Extremities: no cyanosis, clubbing or edema noted bilaterally   Lab Results: Basic Metabolic Panel:  Recent Labs Lab 08/20/13 0750 08/20/13 1424 08/21/13 0742  NA 137  --  136  K 3.8  --  4.5  CL 104  --  106  CO2 21  --  20  GLUCOSE 91  --  133*  BUN 10  --  14  CREATININE 0.69 0.58 0.56  CALCIUM 9.3  --  9.4   CBC:  Recent Labs Lab 08/20/13 1424 08/21/13 0742  WBC 6.6 21.6*  HGB 13.3 13.3  HCT 39.5 39.0  MCV 90.4 91.1  PLT 366 424*   CBG:  Recent Labs Lab 08/20/13 1647  GLUCAP 196*     Micro Results: No results found for this or any previous visit (from the past 240 hour(s)).  Studies/Results: Dg  Chest Port 1 View  08/20/2013   *RADIOLOGY REPORT*  Clinical Data: Cough, shortness of breath, asthma, initial encounter.  PORTABLE CHEST - 1 VIEW  Comparison: 02/10/2012  Findings: Grossly unchanged cardiac silhouette and mediastinal contours.  Improved aeration of the right infrahilar lung.  Lungs appear mildly hyperexpanded with mild diffuse slightly nodular thickening of the interstitium.  No focal airspace opacity.  No pleural effusion or pneumothorax.  No evidence of edema.  Grossly unchanged bones including mild scoliotic curvature of the thoracic spine, possibly positional.  IMPRESSION: Mild lung hyperexpansion and bronchitic change without acute cardiopulmonary disease.   Original Report Authenticated By: Tacey Ruiz, MD    Medications: Scheduled Meds: . benzonatate  100 mg Oral TID  . citalopram  10 mg Oral Daily  . enoxaparin (LOVENOX) injection  40 mg Subcutaneous Q24H  . influenza vac split quadrivalent PF  0.5 mL Intramuscular Tomorrow-1000  . levalbuterol  0.63 mg Nebulization QID   And  . ipratropium  0.5 mg Nebulization QID  . levofloxacin (LEVAQUIN) IV  750 mg Intravenous Q24H  . methylPREDNISolone (SOLU-MEDROL) injection  40 mg Intravenous Q8H  . mirtazapine  45 mg Oral QHS  . mometasone-formoterol  2 puff Inhalation BID  . montelukast  10 mg Oral QHS  .  pantoprazole  40 mg Oral BID  . sodium chloride  3 mL Intravenous Q12H      LOS: 1 day   RAI,RIPUDEEP M.D. [facility] 08/21/2013, 12:02 PM Pager: [phone]  If 7PM-7AM, please contact night-coverage [email or web address] Password [email or web address]

## 2013-08-21 NOTE — Progress Notes (Signed)
Met with patient at bedside regarding Link to Wellness program for [employer] with [employer]. Patient reports she is not interested in the Link to Wellness program at this time. She reports she does not need a hospital follow up call. Provided contact information for her to call in future if needed.  Raiford Noble, MSN- Ed, RN,BSN-- [facility] [phone]

## 2013-08-22 DIAGNOSIS — K219 Gastro-esophageal reflux disease without esophagitis: Secondary | ICD-10-CM

## 2013-08-22 MED ORDER — LEVOFLOXACIN 750 MG PO TABS: 750.0000 mg | ORAL_TABLET | Freq: Every day | ORAL | Status: DC

## 2013-08-22 MED ORDER — PREDNISONE 50 MG PO TABS
60.0000 mg | ORAL_TABLET | Freq: Once | ORAL | Status: DC
  Filled 2013-08-22: qty 1

## 2013-08-22 MED ORDER — HYDROCOD POLST-CHLORPHEN POLST 10-8 MG/5ML PO LQCR: 5.0000 mL | Freq: Two times a day (BID) | ORAL | Status: DC | PRN

## 2013-08-22 MED ORDER — PREDNISONE (PAK) 10 MG PO TABS: ORAL_TABLET | ORAL | Status: DC

## 2013-08-22 MED ORDER — BENZONATATE 100 MG PO CAPS: 100.0000 mg | ORAL_CAPSULE | Freq: Three times a day (TID) | ORAL | Status: DC | PRN

## 2013-08-22 MED ORDER — LEVOFLOXACIN 750 MG PO TABS
750.0000 mg | ORAL_TABLET | Freq: Every day | ORAL | Status: DC
  Filled 2013-08-22: qty 1

## 2013-08-22 MED ORDER — FLUCONAZOLE 150 MG PO TABS
150.0000 mg | ORAL_TABLET | Freq: Once | ORAL | Status: DC
  Filled 2013-08-22: qty 1

## 2013-08-22 MED ORDER — FLUCONAZOLE 150 MG PO TABS: 150.0000 mg | ORAL_TABLET | ORAL | Status: DC

## 2013-08-22 MED ORDER — PANTOPRAZOLE SODIUM 40 MG PO TBEC: 40.0000 mg | DELAYED_RELEASE_TABLET | Freq: Two times a day (BID) | ORAL | Status: DC

## 2013-08-22 NOTE — Discharge Summary (Addendum)
Physician Discharge Summary  Patient ID: Kelsey [name] MRN: [id number] DOB/AGE: 05-27-1981 32 y.o.  Admit date: 08/20/2013 Discharge date: 08/22/2013  Primary Care Physician:  Katy Apo, MD  Discharge Diagnoses:    . Asthma exacerbation . Tachycardia History of anxiety  Consults:  Pulmonology   Recommendations for Outpatient Follow-up:  1. patient was placed on twice a day Protonix. She was counseled on close monitoring and followup with her PCP and pulmonology.  Allergies:   Allergies  Allergen Reactions  . Penicillins Rash     Discharge Medications:   Medication List         albuterol (5 MG/ML) 0.5% nebulizer solution  Commonly known as:  PROVENTIL  Take 5 mg by nebulization every 6 (six) hours as needed. For wheeze and shortness of breath     albuterol 108 (90 BASE) MCG/ACT inhaler  Commonly known as:  PROVENTIL HFA;VENTOLIN HFA  Inhale 2 puffs into the lungs every 4 (four) hours as needed (For shortness of breath).     ALPRAZolam 0.5 MG tablet  Commonly known as:  XANAX  Take 1 mg by mouth at bedtime as needed. For anxiety     benzonatate 100 MG capsule  Commonly known as:  TESSALON  Take 1 capsule (100 mg total) by mouth 3 (three) times daily as needed for cough.     chlorpheniramine-HYDROcodone 10-8 MG/5ML Lqcr  Commonly known as:  TUSSIONEX  Take 5 mLs by mouth every 12 (twelve) hours as needed (cough).     citalopram 10 MG tablet  Commonly known as:  CELEXA  Take 10 mg by mouth daily.     cyclobenzaprine 5 MG tablet  Commonly known as:  FLEXERIL  Take 1 tablet (5 mg total) by mouth 2 (two) times daily as needed for muscle spasms. For muscle spasms     fluconazole 150 MG tablet  Commonly known as:  DIFLUCAN  Take 1 tablet (150 mg total) by mouth every 3 (three) days. For 2 doses     Fluticasone-Salmeterol 250-50 MCG/DOSE Aepb  Commonly known as:  ADVAIR  Inhale 1 puff into the lungs every 12 (twelve) hours.     levofloxacin 750 MG  tablet  Commonly known as:  LEVAQUIN  Take 1 tablet (750 mg total) by mouth daily. X 3 more days     levonorgestrel 20 MCG/24HR IUD  Commonly known as:  MIRENA  1 each by Intrauterine route once.     mirtazapine 15 MG tablet  Commonly known as:  REMERON  Take 45 mg by mouth at bedtime.     montelukast 10 MG tablet  Commonly known as:  SINGULAIR  Take 1 tablet (10 mg total) by mouth at bedtime.     pantoprazole 40 MG tablet  Commonly known as:  PROTONIX  Take 1 tablet (40 mg total) by mouth 2 (two) times daily.     predniSONE 10 MG tablet  Commonly known as:  STERAPRED UNI-PAK  - Prednisone dosing: Take  Prednisone 40mg  (4 tabs) x 3 days, then taper to 30mg  (3 tabs) x 3 days, then 20mg  (2 tabs) x 3days, then 10mg  (1 tab) x 3days, then OFF.  -   - Dispense:  30 tabs, refills: None         Brief H and P: For complete details please refer to admission H and P, but in brief  Patient is a 32 year old female who works as a Buyer, retail at [employer] presented with productive cough with yellowish  sputum and wheezing. Patient reported that she was having nasal congestion, cold, sore throat for the last 1 week, she was using her albuterol inhaler every night. However on the morning of admission, when she was getting ready to come to work, around 6 AM, her breathing became worse with wheezing. She used her albuterol inhaler with no significant improvement. She also noticed that she was coughing a lot with the yellowish phlegm, but no fevers or chills. She felt chest tightness worse with deep breathing.  Patient received prolonged albuterol nebulizer treatment with IV steroids, wheezing had somewhat improved but still  patient was quite winded on ambulation.   Hospital Course:   Asthma exacerbation: Improving. Patient was admitted and placed on scheduled Xopenex and Atrovent nebs, Dulera. She was placed on IV steroids and to transition to prednisone today on 08/22/2013 .  Pulmonology consult was also obtained at the patient's request. Pulmonology recommended Levaquin to continue for total of 5 days. Patient had no pneumonia on the chest x-ray. Leukocytosis on 08/21/13 was secondary to IV steroids, patient did not spike any fever. Overall she is close to her baseline and cleared by pulmonology to DC home.  Anxiety: Xanax as needed, Remeron   Tachycardia: Improved after starting on Xopenex     Day of Discharge BP 127/78  Pulse 94  Temp(Src) 98 F (36.7 C) (Axillary)  Resp 20  Ht 5\' 4"  (1.626 m)  Wt 71.895 kg (158 lb 8 oz)  BMI 27.19 kg/m2  SpO2 96%  Physical Exam: General: Alert and awake oriented x3 not in any acute distress. CVS: S1-S2 clear no murmur rubs or gallops Chest: clear to auscultation bilaterally, no wheezing rales or rhonchi Abdomen: soft nontender, nondistended, normal bowel sounds Extremities: no cyanosis, clubbing or edema noted bilaterally Neuro: Cranial nerves II-XII intact, no focal neurological deficits   The results of significant diagnostics from this hospitalization (including imaging, microbiology, ancillary and laboratory) are listed below for reference.    LAB RESULTS: Basic Metabolic Panel:  Recent Labs Lab 08/20/13 0750 08/20/13 1424 08/21/13 0742  NA 137  --  136  K 3.8  --  4.5  CL 104  --  106  CO2 21  --  20  GLUCOSE 91  --  133*  BUN 10  --  14  CREATININE 0.69 0.58 0.56  CALCIUM 9.3  --  9.4   Liver Function Tests: No results found for this basename: AST, ALT, ALKPHOS, BILITOT, PROT, ALBUMIN,  in the last 168 hours No results found for this basename: LIPASE, AMYLASE,  in the last 168 hours No results found for this basename: AMMONIA,  in the last 168 hours CBC:  Recent Labs Lab 08/20/13 1424 08/21/13 0742  WBC 6.6 21.6*  HGB 13.3 13.3  HCT 39.5 39.0  MCV 90.4 91.1  PLT 366 424*   Cardiac Enzymes: No results found for this basename: CKTOTAL, CKMB, CKMBINDEX, TROPONINI,  in the last 168  hours BNP: No components found with this basename: POCBNP,  CBG:  Recent Labs Lab 08/20/13 1647  GLUCAP 196*    Significant Diagnostic Studies:  Dg Chest Port 1 View  08/20/2013   *RADIOLOGY REPORT*  Clinical Data: Cough, shortness of breath, asthma, initial encounter.  PORTABLE CHEST - 1 VIEW  Comparison: 02/10/2012  Findings: Grossly unchanged cardiac silhouette and mediastinal contours.  Improved aeration of the right infrahilar lung.  Lungs appear mildly hyperexpanded with mild diffuse slightly nodular thickening of the interstitium.  No focal airspace opacity.  No pleural effusion or pneumothorax.  No evidence of edema.  Grossly unchanged bones including mild scoliotic curvature of the thoracic spine, possibly positional.  IMPRESSION: Mild lung hyperexpansion and bronchitic change without acute cardiopulmonary disease.   Original Report Authenticated By: Tacey Ruiz, MD    Disposition and Follow-up:     Discharge Orders   Future Appointments Provider Department Dept Phone   08/28/2013 11:30 AM Julio Sicks, NP [facility] [phone]   Future Orders Complete By Expires   Diet - low sodium heart healthy  As directed    Increase activity slowly  As directed        DISPOSITION: home  DIET heart healthy diet   ACTIVITY:  as tolerated  DISCHARGE FOLLOW-UP Follow-up Information   Follow up with PARRETT,TAMMY, NP On 08/28/2013. (at 11:30AM)    Specialty:  Nurse Practitioner   Contact information:   [address] [place] [address] [phone]       Follow up with Katy Apo, MD. Schedule an appointment as soon as possible for a visit in 2 weeks.   Specialty:  Internal Medicine   Contact information:   [address] [place] [address] [phone]       Time spent on Discharge:  40 minutes   Signed:   Akshar Starnes M.D. [facility] 08/22/2013, 10:42 AM Pager: [phone]

## 2013-08-22 NOTE — Progress Notes (Signed)
Patient's iv d/c, telemetry d/c.  D/c instructions reviewed with patient. Patient verbalized understanding and prescriptions were given to patient.  Patient d/c home with husband and note of hospitalization was given to patient for husband being here with patient while see was hospitalized.  Patient was taken to car by volunteer via wheelchair.

## 2013-08-22 NOTE — Consult Note (Signed)
PULMONARY  / CRITICAL CARE MEDICINE  Name: Kelsey [name] MRN: [id number] DOB: Jan 07, 1981    ADMISSION DATE:  08/20/2013 CONSULTATION DATE:  9/26  REFERRING MD :  RAI PRIMARY SERVICE:  [facility]  CHIEF COMPLAINT:  Asthma   BRIEF PATIENT DESCRIPTION:  80 yof f/b RA for asthma. Admitted on 9/24 for asthmatic flare. 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 She was using her albuterol inhaler every night. However the morning of admit when she was getting ready to come to work, around 6 AM, her breathing became worse with wheezing. She used her albuterol inhaler with no significant improvement. She also noticed that she was coughing a lot with the yellowish phlegm, but no fevers or chills. She felt chest tightness worse with deep breathing. In the ER Patient received prolonged albuterol nebulizer treatment with IV steroids, wheezing has somewhat improved. She was admitted for treatment of asthma flare.   PAST MEDICAL HISTORY :  Past Medical History  Diagnosis Date  . PVC (premature ventricular contraction)   . Palpitations   . Pre-syncope   . Tachycardia   . Depression   . Insomnia   . Seasonal allergies   . Asthma   . Anxiety    Past Surgical History  Procedure Laterality Date  . Cesarean section  10/15/2009, 02/08/2006    times 2   Prior to Admission medications   Medication Sig Start Date End Date Taking? Authorizing Provider  albuterol (PROVENTIL HFA;VENTOLIN HFA) 108 (90 BASE) MCG/ACT inhaler Inhale 2 puffs into the lungs every 4 (four) hours as needed (For shortness of breath). 04/10/13  Yes Oretha Milch, MD  albuterol (PROVENTIL) (5 MG/ML) 0.5% nebulizer solution  Take 5 mg by nebulization every 6 (six) hours as needed. For wheeze and shortness of breath   Yes Historical Provider, MD  ALPRAZolam (XANAX) 0.5 MG tablet Take 1 mg by mouth at bedtime as needed. For anxiety   Yes Historical Provider, MD  citalopram (CELEXA) 10 MG tablet Take 10 mg by mouth daily.   Yes Historical Provider, MD  cyclobenzaprine (FLEXERIL) 5 MG tablet Take 1 tablet (5 mg total) by mouth 2 (two) times daily as needed for muscle spasms. For muscle spasms 10/02/12  Yes Sherren Mocha, MD  Fluticasone-Salmeterol (ADVAIR) 250-50 MCG/DOSE AEPB Inhale 1 puff into the lungs every 12 (twelve) hours. 06/19/12  Yes Oretha Milch, MD  levonorgestrel (MIRENA) 20 MCG/24HR IUD 1 each by Intrauterine route once.   Yes Historical Provider, MD  mirtazapine (REMERON) 15 MG tablet Take 45 mg by mouth at bedtime.    Yes Historical Provider, MD  montelukast (SINGULAIR) 10 MG tablet Take 1 tablet (10 mg total) by mouth at bedtime. 04/10/13  Yes Oretha Milch, MD   Allergies  Allergen Reactions  . Penicillins Rash    FAMILY HISTORY:  Family History  Problem Relation Age of Onset  . Heart disease Father   . Rheumatologic disease Mother   . Lung cancer Paternal Grandmother    SOCIAL HISTORY:  reports that she has never smoked. She does not have any smokeless tobacco history on file. She reports that she does not drink alcohol or  use illicit drugs.  REVIEW OF SYSTEMS (bolds are positive):   Constitutional: Negative for fever, chills, weight loss, malaise/fatigue and diaphoresis.  HENT: Negative for hearing loss, ear pain, nosebleeds, congestion, sore throat, neck pain, tinnitus and ear discharge.   Eyes: Negative for blurred vision, double vision, photophobia, pain, discharge and redness.  Respiratory: Negative for cough, more frequent at night, hemoptysis, sputum production, yellow shortness of breath, wheezing and stridor.   Cardiovascular: Negative for chest pain, palpitations, orthopnea, claudication,  leg swelling and PND.  Gastrointestinal: Negative for heartburn, nausea, vomiting, abdominal pain, diarrhea, constipation, blood in stool and melena.  Genitourinary: Negative for dysuria, urgency, frequency, hematuria and flank pain.  Musculoskeletal: Negative for myalgias, back pain, joint pain and falls.  Skin: Negative for itching and rash.  Neurological: Negative for dizziness, tingling, tremors, sensory change, speech change, focal weakness, seizures, loss of consciousness, weakness and headaches.  Endo/Heme/Allergies: Negative for environmental allergies and polydipsia. Does not bruise/bleed easily.  SUBJECTIVE:  Feels better,s till has cough  VITAL SIGNS: Temp:  [97.7 F (36.5 C)-98.7 F (37.1 C)] 98 F (36.7 C) (09/26 0500) Pulse Rate:  [94-128] 94 (09/26 0500) Resp:  [20] 20 (09/25 1400) BP: (122-136)/(73-82) 127/78 mmHg (09/26 0500) SpO2:  [95 %-96 %] 96 % (09/26 0856)  PHYSICAL EXAMINATION: General:  32 year old female, sitting up in bed. No acute distress. Wants to go home  Neuro:  A&o, no focal def  HEENT:  MMM, no adenopathy, no upper airway wheeze  Cardiovascular:  rrr Lungs:  Wet cough, no wheeze Abdomen:  Soft, non-tender  Musculoskeletal:  Intact  Skin:  Intact    Recent Labs Lab 08/20/13 0750 08/20/13 1424 08/21/13 0742  NA 137  --  136  K 3.8  --  4.5  CL 104  --  106  CO2 21  --  20  BUN 10  --  14  CREATININE 0.69 0.58 0.56  GLUCOSE 91  --  133*    Recent Labs Lab 08/20/13 0750 08/20/13 1424 08/21/13 0742  HGB 14.4 13.3 13.3  HCT 42.9 39.5 39.0  WBC 6.1 6.6 21.6*  PLT 361 366 424*   No results found.  ASSESSMENT / PLAN: Asthmatic exacerbation Purulent bronchitis GERD  She has improved w/ typical treatment, but still has wet non-productive cough which is worse at night. Think that there is a reflux component contributing to her symptoms.   Rec: Ok to d/c home Resume advair Change SABA back to PRN Complete 5d total abx Cont PPI  at d/c Taper steroids  See [place] next week (has appointment already)  Anders Simmonds ACNP-BC [facility] Pulmonary/Critical Care Pager # [phone] OR # [phone] if no answer   Alyson Reedy, M.D. Pulmonary and Critical Care Medicine [facility] Pager: [phone]  08/22/2013, 9:40 AM

## 2013-08-27 ENCOUNTER — Other Ambulatory Visit (INDEPENDENT_AMBULATORY_CARE_PROVIDER_SITE_OTHER): Payer: 59

## 2013-08-27 ENCOUNTER — Ambulatory Visit (INDEPENDENT_AMBULATORY_CARE_PROVIDER_SITE_OTHER)
Admission: RE | Admit: 2013-08-27 | Discharge: 2013-08-27 | Disposition: A | Discharge status: Home / Self Care | Payer: 59 | Source: Ambulatory Visit | Attending: Adult Health | Admitting: Adult Health

## 2013-08-27 ENCOUNTER — Encounter: Payer: Self-pay | Admitting: Adult Health

## 2013-08-27 ENCOUNTER — Ambulatory Visit (INDEPENDENT_AMBULATORY_CARE_PROVIDER_SITE_OTHER): Payer: 59 | Admitting: Adult Health

## 2013-08-27 VITALS — BP 132/84 | HR 89 | Temp 98.1°F | Ht 64.0 in | Wt 163.0 lb

## 2013-08-27 DIAGNOSIS — J45901 Unspecified asthma with (acute) exacerbation: Secondary | ICD-10-CM

## 2013-08-27 LAB — CBC WITH DIFFERENTIAL/PLATELET
Basophils Absolute: 0 10*3/uL (ref 0.0–0.1)
Eosinophils Absolute: 0.3 10*3/uL (ref 0.0–0.7)
Hemoglobin: 13.4 g/dL (ref 12.0–15.0)
Lymphocytes Relative: 11.1 % — ABNORMAL LOW (ref 12.0–46.0)
MCHC: 33.6 g/dL (ref 30.0–36.0)
Neutro Abs: 11.7 10*3/uL — ABNORMAL HIGH (ref 1.4–7.7)
Neutrophils Relative %: 83.4 % — ABNORMAL HIGH (ref 43.0–77.0)
Platelets: 395 10*3/uL (ref 150.0–400.0)
RDW: 12.8 % (ref 11.5–14.6)

## 2013-08-27 NOTE — Progress Notes (Signed)
Subjective:    Patient ID: Kelsey [name], female    DOB: 07/30/1981, 32 y.o.   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  She reports mild intermittent symptoms since childhood only requiring albuterol when necessary. She had 3 flares in the last 3 months including a visit to the emergency room in 5/13. She required prednisone tapers on these occasions. Singulair was started on last office visit 04/26/2012 in addition to Advair 250/50 and she reports improvement in her symptoms. She reports nocturnal wheezing requiring use of albuterol. Asthma triggers include heat humidity allergies and cats  There is no obvious environmental trigger, she has lived in the same house for the last 5 years. She has a dog for 10 years. She has perennial allergies and took allergy shots until age 32. Her symptoms are not worse at work and she does not describe any triggers at work. She reports anxiety and depression and recent marital stress. He had an emergency room visit in 4/13 for anxiety and tachycardia . She has kids aged 5 and 2.  Labs from 2012 are unremarkable. Of note she had a rheumatological evaluation by Dr. Nickola Major, ANA, RA factor was negative sedimentation rate was 3, CPK was 44, thyroid function was normal Allergy testing - High titers to cat & dog dander, grass & house dust  10/02/12  pt went to ED 1 month ago for asthma attack. having asthma attacks 1-2 times/month. no cough, wheezing, chest tx Compliant with advair & singulair No nocturnal wheeze   02/19/13  Acute OV  Complains of sore throat, ear ache, cough with thick yellow mucus, wheezing for 1 week.  Daughter and son have URI. Symptoms .  No overt reflux, chest pain or edema.  Has had 1 flare around Dec 2013 , took steroid taper.  Continues on Advair and Singulair  Increased use of SABA last few days.  >>zpack and pred taper   07/24/13  Acute OV  Complains of  increased SOB, wheezing, chest tightness, prod cough with some yellow mucus, head congestion w/ clear drainage, PND x1 weeks - denies f/c/s Mucus is mainly clear . No fever.  Increased SABA use.  Needs FMLA paperwork filled out.  Doing well until last week with rare use of SABA  >zpack /steroids   08/27/2013 Post Hospital follow up  Patient returns for a post hospital followup. She was admitted September 24-26, for severe asthma exacerbation. She was treated with empiric antibiotics, IV steroids, and nebulized bronchodilators. Patient was changed from Advair to [facility]. She was started on Protonix Twice daily  .  Since discharge she is still having wheezing and cough.  Discharged on Levaquin and steroid  Has finished Levaquin.  Is on prednisone 40mg  daily -increased dose with persistent wheezing.  Continues to use SABA frequently.  She says yesterday was best day with no nocturnal awakenings.  She denies any hemoptysis, orthopnea, PND, or leg swelling. Patient denies any calf pain, recent travel, chest pain, palpitations, or syncope.     Review of Systems Constitutional:   No  weight loss, night sweats,  Fevers, chills, fatigue, or  lassitude.  HEENT:   No headaches,  Difficulty swallowing,  Tooth/dental problems, or  Sore throat,                No sneezing, itching,  + nasal congestion, post nasal drip,   CV:  No chest pain,  Orthopnea, PND,  swelling in lower extremities, anasarca, dizziness, palpitations, syncope.   GI  No heartburn, indigestion, abdominal pain, nausea, vomiting, diarrhea, change in bowel habits, loss of appetite, bloody stools.   Resp:   No chest wall deformity  Skin: no rash or lesions.  GU: no dysuria, change in color of urine, no urgency or frequency.  No flank pain, no hematuria   MS:  No joint pain or swelling.  No decreased range of motion.  No back pain.  Psych:  No change in mood or affect. No depression or anxiety.  No memory loss.           Objective:   Physical Exam   Gen. Pleasant, well-nourished, in no distress ENT - no lesions, no post nasal drip Neck: No JVD, no thyromegaly, no carotid bruits Lungs: no use of accessory muscles, no dullness to percussion, few exp wheezes  Cardiovascular: Rhythm regular, heart sounds  normal, no murmurs or gallops, no peripheral edema Musculoskeletal: No deformities, no cyanosis or clubbing        Assessment & Plan:

## 2013-08-27 NOTE — Patient Instructions (Addendum)
Taper prednisone 40mg  daily for 3 days , 30mg  daily for 5 days , 20mg  daily for 5 days , 10mg  daily for 5 days then 5mg  daily for 5 days and stop .  Continue on Dulera 2 puffs Twice daily   Mucinex DM Twice daily  .As needed  Cough/congestion  Add Zyrtec 10mg  At bedtime   Continue on Protonix Twice daily  .  GERD diet  Please contact office for sooner follow up if symptoms do not improve or worsen or seek emergency care  follow up Dr. Vassie Loll  In 2 weeks and As needed   I will call with xray and lab results.

## 2013-08-27 NOTE — Assessment & Plan Note (Addendum)
Severe exacerbation slow to resolve -recent admission  ? Triggers of AR /GERD  Check cxr and cbc today .  If not improving may need CT sinus   Plan  Taper prednisone 40mg  daily for 3 days , 30mg  daily for 5 days , 20mg  daily for 5 days , 10mg  daily for 5 days then 5mg  daily for 5 days and stop .  Continue on Dulera 2 puffs Twice daily   Mucinex DM Twice daily  .As needed  Cough/congestion  Add Zyrtec 10mg  At bedtime   Continue on Protonix Twice daily  .  GERD diet  Please contact office for sooner follow up if symptoms do not improve or worsen or seek emergency care  follow up Dr. Vassie Loll  In 2 weeks and As needed   I will call with xray and lab results.

## 2013-08-27 NOTE — Progress Notes (Signed)
Quick Note:  Advised pt of lab results per TP. She verbalized understanding and has no further questions or concerns at this time ______

## 2013-08-27 NOTE — Progress Notes (Signed)
Quick Note:  Advised pt of lab results per TP. Pt verbalized understanding and has no further questions or concerns at this time ______

## 2013-08-28 ENCOUNTER — Inpatient Hospital Stay: Payer: 59 | Admitting: Adult Health

## 2013-09-01 ENCOUNTER — Telehealth: Payer: Self-pay | Admitting: Pulmonary Disease

## 2013-09-01 MED ORDER — HYDROCOD POLST-CHLORPHEN POLST 10-8 MG/5ML PO LQCR: 5.0000 mL | Freq: Two times a day (BID) | ORAL | Status: DC | PRN

## 2013-09-01 NOTE — Telephone Encounter (Signed)
That is fine.  Tussionex  #8 oz  1 tsp Twice daily  As needed  Cough, may make you sleepy.  No refills.  Please contact office for sooner follow up if symptoms do not improve or worsen or seek emergency care

## 2013-09-01 NOTE — Telephone Encounter (Signed)
I spoke with pt. She is requesting a refill on tussionex. Pt was giving RX by Dr. Isidoro Donning on 08/22/13 #140 ML x0 refills. She saw TP on 08/27/13 and was advised if she needed refill to call. I advised her as of today RX will have to be picked up and taken to the pharmacy d/t guideline changes. She stated that was fine. Please advise TP thanks

## 2013-09-01 NOTE — Telephone Encounter (Signed)
I spoke with pt and is aware and will pick up in the AM

## 2013-09-08 ENCOUNTER — Ambulatory Visit: Payer: 59 | Admitting: Adult Health

## 2013-09-12 ENCOUNTER — Ambulatory Visit (INDEPENDENT_AMBULATORY_CARE_PROVIDER_SITE_OTHER): Payer: 59 | Admitting: Adult Health

## 2013-09-12 ENCOUNTER — Encounter: Payer: Self-pay | Admitting: Adult Health

## 2013-09-12 VITALS — BP 124/82 | HR 123 | Temp 97.0°F | Ht 64.0 in | Wt 166.2 lb

## 2013-09-12 DIAGNOSIS — J45901 Unspecified asthma with (acute) exacerbation: Secondary | ICD-10-CM

## 2013-09-12 NOTE — Progress Notes (Signed)
Subjective:    Patient ID: Kelsey [name], female    DOB: 02/15/1981, 32 y.o.   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  She reports mild intermittent symptoms since childhood only requiring albuterol when necessary. She had 3 flares in the last 3 months including a visit to the emergency room in 5/13. She required prednisone tapers on these occasions. Singulair was started on last office visit 04/26/2012 in addition to Advair 250/50 and she reports improvement in her symptoms. She reports nocturnal wheezing requiring use of albuterol. Asthma triggers include heat humidity allergies and cats  There is no obvious environmental trigger, she has lived in the same house for the last 5 years. She has a dog for 10 years. She has perennial allergies and took allergy shots until age 60. Her symptoms are not worse at work and she does not describe any triggers at work. She reports anxiety and depression and recent marital stress. He had an emergency room visit in 4/13 for anxiety and tachycardia . She has kids aged 5 and 2.  Labs from 2012 are unremarkable. Of note she had a rheumatological evaluation by Dr. Nickola Major, ANA, RA factor was negative sedimentation rate was 3, CPK was 44, thyroid function was normal Allergy testing - High titers to cat & dog dander, grass & house dust  10/02/12  pt went to ED 1 month ago for asthma attack. having asthma attacks 1-2 times/month. no cough, wheezing, chest tx Compliant with advair & singulair No nocturnal wheeze   02/19/13  Acute OV  Complains of sore throat, ear ache, cough with thick yellow mucus, wheezing for 1 week.  Daughter and son have URI. Symptoms .  No overt reflux, chest pain or edema.  Has had 1 flare around Dec 2013 , took steroid taper.  Continues on Advair and Singulair  Increased use of SABA last few days.  >>zpack and pred taper   07/24/13  Acute OV  Complains of  increased SOB, wheezing, chest tightness, prod cough with some yellow mucus, head congestion w/ clear drainage, PND x1 weeks - denies f/c/s Mucus is mainly clear . No fever.  Increased SABA use.  Needs FMLA paperwork filled out.  Doing well until last week with rare use of SABA  >zpack /steroids   08/27/13  Post Hospital follow up  Patient returns for a post hospital followup. She was admitted September 24-26, for severe asthma exacerbation. She was treated with empiric antibiotics, IV steroids, and nebulized bronchodilators. Patient was changed from Advair to [facility]. She was started on Protonix Twice daily  .  Since discharge she is still having wheezing and cough.  Discharged on Levaquin and steroid  Has finished Levaquin.  Is on prednisone 40mg  daily -increased dose with persistent wheezing.  Continues to use SABA frequently.  She says yesterday was best day with no nocturnal awakenings.  She denies any hemoptysis, orthopnea, PND, or leg swelling. Patient denies any calf pain, recent travel, chest pain, palpitations, or syncope. >>slow pred taper , cxr clear   09/12/2013 Follow up  Pt returns for Asthma follow up  Patient had a severe asthma exacerbation requiring hospitalization. She was treated with Levaquin and a prednisone taper. Patient has now finished all her prednisone and antibiotics. She feels that she is slowly improving. She is decreasing. Her albuterol use. She is now on [facility] Twice daily  .  Cough is better still have  clear -yellow mucus, . Denies any fever, no chills, no sweats.     Review of Systems Constitutional:   No  weight loss, night sweats,  Fevers, chills, fatigue, or  lassitude.  HEENT:   No headaches,  Difficulty swallowing,  Tooth/dental problems, or  Sore throat,                No sneezing, itching,  + nasal congestion, post nasal drip,   CV:  No chest pain,  Orthopnea, PND, swelling in lower extremities, anasarca, dizziness, palpitations, syncope.    GI  No heartburn, indigestion, abdominal pain, nausea, vomiting, diarrhea, change in bowel habits, loss of appetite, bloody stools.   Resp:   No chest wall deformity  Skin: no rash or lesions.  GU: no dysuria, change in color of urine, no urgency or frequency.  No flank pain, no hematuria   MS:  No joint pain or swelling.  No decreased range of motion.  No back pain.  Psych:  No change in mood or affect. No depression or anxiety.  No memory loss.          Objective:   Physical Exam   Gen. Pleasant, well-nourished, in no distress ENT - no lesions, no post nasal drip Neck: No JVD, no thyromegaly, no carotid bruits Lungs: no use of accessory muscles, no dullness to percussion, no wheezing. Cardiovascular: Rhythm regular, heart sounds  normal, no murmurs or gallops, no peripheral edema Musculoskeletal: No deformities, no cyanosis or clubbing        Assessment & Plan:

## 2013-09-12 NOTE — Assessment & Plan Note (Signed)
Resolved -improved   Plan  Continue on Dulera 2 puffs Twice daily   Mucinex DM Twice daily  .As needed  Cough/congestion  Zyrtec 10mg  At bedtime   Restart Flonase 2 puffs At bedtime   Continue on Protonix Twice daily  .  GERD diet  Please contact office for sooner follow up if symptoms do not improve or worsen or seek emergency care  Follow up Dr. Vassie Loll  In 4  weeks and As needed

## 2013-09-12 NOTE — Patient Instructions (Addendum)
Continue on Dulera 2 puffs Twice daily   Mucinex DM Twice daily  .As needed  Cough/congestion  Zyrtec 10mg  At bedtime   Restart Flonase 2 puffs At bedtime   Continue on Protonix Twice daily  .  GERD diet  Please contact office for sooner follow up if symptoms do not improve or worsen or seek emergency care  Follow up Dr. Vassie Loll  In 4  weeks and As needed

## 2013-09-15 ENCOUNTER — Ambulatory Visit
Admission: RE | Admit: 2013-09-15 | Discharge: 2013-09-15 | Disposition: A | Discharge status: Home / Self Care | Payer: 59 | Source: Ambulatory Visit | Attending: Internal Medicine | Admitting: Internal Medicine

## 2013-09-15 ENCOUNTER — Other Ambulatory Visit: Payer: Self-pay | Admitting: Internal Medicine

## 2013-09-15 DIAGNOSIS — R079 Chest pain, unspecified: Secondary | ICD-10-CM

## 2013-09-18 ENCOUNTER — Telehealth: Payer: Self-pay | Admitting: Pulmonary Disease

## 2013-09-18 NOTE — Telephone Encounter (Signed)
lmomtcb x1 for pt 

## 2013-09-18 NOTE — Telephone Encounter (Signed)
Spoke with pt. She c/o CP on right side x Monday. Last night and today the pain has became more frequent. It does not radiate. She is still coughing up yellow phlem and having wheezing. Pt is taking mucinex as directed. She saw PCP Monday and they thought it was a pulled muscle and gave muscle relaxant. She did do labs and CXR at there office but has not been called back yet. Report for CXR is in EPIC. 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 Pt requesting recs. Please advise TP thanks  Allergies  Allergen Reactions  . Penicillins Rash

## 2013-09-18 NOTE — Telephone Encounter (Signed)
Sounds like pleurisy, would use heating pads /warm compresses to area Take Advil  Or Aleve for few days  May use muscle relaxers as PCP advised  If not improving will need ov ?what labs were done , did not see in EPIC  Make she has no hemoptysis, calf pain , leg swelling or fever.  Please contact office for sooner follow up if symptoms do not improve or worsen or seek emergency care  follow up Dr. Vassie Loll  As planned and As needed

## 2013-09-18 NOTE — Telephone Encounter (Signed)
Patient returning call.

## 2013-09-18 NOTE — Telephone Encounter (Signed)
I spoke with pt and is aware of TP recs. She denies any leg swelling/ no calf pain, no coughing up blood and no fever. She will call if she does not improve for OV.

## 2013-09-20 ENCOUNTER — Other Ambulatory Visit: Payer: Self-pay | Admitting: Adult Health

## 2013-09-22 ENCOUNTER — Telehealth: Payer: Self-pay | Admitting: Adult Health

## 2013-09-22 NOTE — Telephone Encounter (Signed)
We received a refill request through MyChart for Tussionex.  Last OV 09/12/13 with TP Pending OV 10/20/13 with RA  Please advise on refill, thanks.

## 2013-09-23 ENCOUNTER — Telehealth: Payer: Self-pay | Admitting: Pulmonary Disease

## 2013-09-23 NOTE — Telephone Encounter (Signed)
I spoke with pt. She reports she has done everything TP has recommended. She is not able to come in for OV until Friday. She is scheduled to see TP at 9:15 09/26/13. Nothing further needed

## 2013-09-24 MED ORDER — HYDROCOD POLST-CHLORPHEN POLST 10-8 MG/5ML PO LQCR: 5.0000 mL | Freq: Two times a day (BID) | ORAL | Status: DC | PRN

## 2013-09-24 NOTE — Telephone Encounter (Signed)
Tussionex last given 10.6.14 phone note Seen by TP 10.1.14 and 10.17.14 for acute visits Upcoming ov w/ TP on 10.31.14 Upcoming ov w/ RA on 11.24.14  Per TP: okay to fill Tussionex #8oz, 0 refills   1tsp q12h prn cough  Rx printed and signed by TP Called spoke with patient who stated she will pick up rx at 10.31.14 ov w/ TP Patient aware I will hold on to this rx and give to her at ov. Will sign off.

## 2013-09-25 ENCOUNTER — Ambulatory Visit (INDEPENDENT_AMBULATORY_CARE_PROVIDER_SITE_OTHER): Payer: 59 | Admitting: Pulmonary Disease

## 2013-09-25 ENCOUNTER — Telehealth: Payer: Self-pay | Admitting: Pulmonary Disease

## 2013-09-25 ENCOUNTER — Encounter: Payer: Self-pay | Admitting: Pulmonary Disease

## 2013-09-25 VITALS — BP 122/78 | HR 115 | Temp 97.4°F | Ht 64.0 in | Wt 160.6 lb

## 2013-09-25 DIAGNOSIS — K219 Gastro-esophageal reflux disease without esophagitis: Secondary | ICD-10-CM

## 2013-09-25 DIAGNOSIS — J45901 Unspecified asthma with (acute) exacerbation: Secondary | ICD-10-CM

## 2013-09-25 MED ORDER — TRAMADOL HCL 50 MG PO TABS: 50.0000 mg | ORAL_TABLET | Freq: Two times a day (BID) | ORAL | Status: DC | PRN

## 2013-09-25 MED ORDER — PREDNISONE 10 MG PO TABS: ORAL_TABLET | ORAL | Status: DC

## 2013-09-25 MED ORDER — AZITHROMYCIN 250 MG PO TABS: ORAL_TABLET | ORAL | Status: DC

## 2013-09-25 MED ORDER — BECLOMETHASONE DIPROPIONATE 80 MCG/ACT IN AERS: 1.0000 | INHALATION_SPRAY | Freq: Two times a day (BID) | RESPIRATORY_TRACT | Status: DC

## 2013-09-25 NOTE — Assessment & Plan Note (Signed)
Stay on PPI 

## 2013-09-25 NOTE — Patient Instructions (Signed)
Z-pak Tramadol twice daily as needed for pain OK to take tussionex at bedtime Stay on dulera - Add Qvar 80 (sample) 1 puff twice daily - RINSE mouth after use Prednisone 10 mg Take 4 tabs  daily with food x 7 days, then 3 tabs daily x 7 days, then 2 tabs daily x 7 days, then 1 tab daily #40 We may have to consider XOLAIR in the future CXR next visit

## 2013-09-25 NOTE — Progress Notes (Signed)
  Subjective:    Patient ID: Kelsey [name], female    DOB: [birth year], 32 y.o.   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  She reports mild intermittent symptoms since childhood only requiring albuterol when necessary. She had frequent flares in 2013, stable x 1 yr, & again flares since 06/2013. Singulair was started  04/26/2012 in addition to Advair 250/50  - advair changed to dulera 08/2013 Asthma triggers include heat humidity allergies and cats  Environment - she has lived in the same house for the last 5 years. She has a dog for 10 years. She has perennial allergies and took allergy shots until age 45. Her symptoms are not worse at work and she does not describe any triggers at work. She reports anxiety and depression and recent marital stress.  She has kids aged 5 and 2.  Of note she had a rheumatological evaluation by Dr. Nickola Major, ANA, RA factor was negative sedimentation rate was 3, CPK was 44, thyroid function was normal   Allergy testing - High titers to cat & dog dander, grass & house dust   PFTs in November 2013 showed FEV1 of 1.87-59% improving to 2.24-70% with bronchodilator.   09/25/2013  Patient had a severe asthma exacerbation requiring hospitalization September 24-26. She was treated with Levaquin and a prednisone taper. She improved while on pred taper , now worse again She is now on [facility] Twice daily .  Cough is better still have clear -yellow mucus, . Denies any fever, no chills, no sweats.  10/23 >> c/o CP on right side x 2 wks. Last night and today the pain has became more frequent. It does not radiate. She is still coughing up yellow phlem and having wheezing. Pt is taking mucinex as directed. She saw PCP Monday and they thought it was a pulled muscle and gave muscle relaxant.  CXR - read as stable RML infiltrate but I do not appreciate >> refilled tussionex Compliant with meds incl dulera  - No change  in home or work environment, working at [employer] more Reflux controlled on protonix, cold sores around [name]   Past Medical History  Diagnosis Date  . PVC (premature ventricular contraction)   . Palpitations   . Pre-syncope   . Tachycardia   . Depression   . Insomnia   . Seasonal allergies   . Asthma   . Anxiety       Review of Systems neg for any significant sore throat, dysphagia, itching, sneezing, nasal congestion or excess/ purulent secretions, fever, chills, sweats, unintended wt loss, pleuritic or exertional cp, hempoptysis, orthopnea pnd or change in chronic leg swelling. Also denies presyncope, palpitations, heartburn, abdominal pain, nausea, vomiting, diarrhea or change in bowel or urinary habits, dysuria,hematuria, rash, arthralgias, visual complaints, headache, numbness weakness or ataxia.     Objective:   Physical Exam  Gen. Pleasant, obese, in no distress, normal affect ENT - no lesions, no post nasal drip, class 2-3 airway Neck: No JVD, no thyromegaly, no carotid bruits Lungs: no use of accessory muscles, no dullness to percussion, decreased with faint BL rhonchi  Cardiovascular: Rhythm regular, heart sounds  normal, no murmurs or gallops, no peripheral edema Abdomen: soft and non-tender, no hepatosplenomegaly, BS normal. Musculoskeletal: No deformities, no cyanosis or clubbing Neuro:  alert, non focal, no tremors       Assessment & Plan:

## 2013-09-25 NOTE — Assessment & Plan Note (Signed)
Z-pak Tramadol twice daily as needed for pain OK to take tussionex at bedtime Stay on dulera - Add Qvar 80 (sample) 1 puff twice daily - RINSE mouth after use Prednisone 10 mg Take 4 tabs  daily with food x 7 days, then 3 tabs daily x 7 days, then 2 tabs daily x 7 days, then 1 tab daily #40 We may have to consider XOLAIR in the future CXR next visit  

## 2013-09-25 NOTE — Telephone Encounter (Signed)
Error.Kelsey [name] ° °

## 2013-09-26 ENCOUNTER — Ambulatory Visit: Payer: 59 | Admitting: Adult Health

## 2013-09-29 ENCOUNTER — Telehealth: Payer: Self-pay | Admitting: Adult Health

## 2013-09-29 MED ORDER — MONTELUKAST SODIUM 10 MG PO TABS: 10.0000 mg | ORAL_TABLET | Freq: Every day | ORAL | Status: DC

## 2013-09-29 MED ORDER — MOMETASONE FURO-FORMOTEROL FUM 200-5 MCG/ACT IN AERO: 2.0000 | INHALATION_SPRAY | Freq: Two times a day (BID) | RESPIRATORY_TRACT | Status: DC

## 2013-09-29 NOTE — Telephone Encounter (Signed)
I spoke with pt and confirmed RX's needed. They have been refilled. Nothing further needed

## 2013-10-13 LAB — HM PAP SMEAR: HM PAP: NEGATIVE

## 2013-10-20 ENCOUNTER — Encounter: Payer: Self-pay | Admitting: Pulmonary Disease

## 2013-10-20 ENCOUNTER — Ambulatory Visit (INDEPENDENT_AMBULATORY_CARE_PROVIDER_SITE_OTHER): Payer: 59 | Admitting: Pulmonary Disease

## 2013-10-20 VITALS — BP 132/76 | HR 76 | Ht 64.0 in | Wt 163.8 lb

## 2013-10-20 DIAGNOSIS — J45901 Unspecified asthma with (acute) exacerbation: Secondary | ICD-10-CM

## 2013-10-20 MED ORDER — PREDNISONE 5 MG PO TABS: ORAL_TABLET | ORAL | Status: DC

## 2013-10-20 NOTE — Progress Notes (Signed)
  Subjective:    Patient ID: Kelsey [name], female    DOB: [birth year], 32 y.o.   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  She reports mild intermittent symptoms since childhood only requiring albuterol when necessary. She had frequent flares in 2013, stable x 1 yr, & again flares since 06/2013. Singulair was started 04/26/2012 in addition to Advair 250/50 - advair changed to dulera 08/2013 Asthma triggers include heat humidity allergies and cats  Environment - she has lived in the same house for the last 5 years. She has a dog for 10 years. She has perennial allergies and took allergy shots until age 16. Her symptoms are not worse at work and she does not describe any triggers at work. She reports anxiety and depression and recent marital stress. She has kids aged 5 and 2.  Of note she had a rheumatological evaluation by Dr. Nickola Major, ANA, RA factor was negative sedimentation rate was 3, CPK was 44, thyroid function was normal  Allergy testing - High titers to cat & dog dander, grass & house dust  PFTs in November 2013 showed FEV1 of 1.87-59% improving to 2.24-70% with bronchodilator.     10/20/2013  Chief Complaint  Patient presents with  . Follow-up    Pt reports breathing is doing better. has very little dry cough. Denies any wheezing and no chest tx    Patient had a severe asthma exacerbation requiring hospitalization September 24-26. Due to her frequent symptoms we decided on an extended pred taper on last visit 08/2013  Cough is better still have clear -yellow mucus, . Denies any fever, no chills, no sweats.  Chest pain-resolved  Compliant with meds incl dulera - No change in home or work environment, working at [employer] more  Reflux controlled on protonix Down to 20 mg pred -back to baseline , no nocturnal symptoms  Review of Systems neg for any significant sore throat, dysphagia, itching, sneezing, nasal  congestion or excess/ purulent secretions, fever, chills, sweats, unintended wt loss, pleuritic or exertional cp, hempoptysis, orthopnea pnd or change in chronic leg swelling. Also denies presyncope, palpitations, heartburn, abdominal pain, nausea, vomiting, diarrhea or change in bowel or urinary habits, dysuria,hematuria, rash, arthralgias, visual complaints, headache, numbness weakness or ataxia.     Objective:   Physical Exam  Gen. Pleasant, well-nourished, in no distress ENT - no lesions, no post nasal drip Neck: No JVD, no thyromegaly, no carotid bruits Lungs: no use of accessory muscles, no dullness to percussion, clear without rales or rhonchi  Cardiovascular: Rhythm regular, heart sounds  normal, no murmurs or gallops, no peripheral edema Musculoskeletal: No deformities, no cyanosis or clubbing        Assessment & Plan:

## 2013-10-20 NOTE — Patient Instructions (Signed)
Drop to 15 mg prednisone x 1 week On dec 1 st drop to 10 mg On dec 15 th drop to 5 mg Call after christmas on this dose Stay on dulera/ Qvar

## 2013-10-21 NOTE — Assessment & Plan Note (Signed)
Drop to 15 mg prednisone x 1 week On dec 1 st drop to 10 mg On dec 15 th drop to 5 mg Call after christmas on this dose Stay on dulera/ Qvar Hold off on xolair for now - see how she does on low dose prednisone

## 2013-10-22 ENCOUNTER — Emergency Department (HOSPITAL_COMMUNITY)
Admission: EM | Admit: 2013-10-22 | Discharge: 2013-10-22 | Disposition: A | Discharge status: Home / Self Care | Payer: 59 | Source: Home / Self Care | Attending: Emergency Medicine | Admitting: Emergency Medicine

## 2013-10-22 ENCOUNTER — Encounter (HOSPITAL_COMMUNITY): Payer: Self-pay | Admitting: Emergency Medicine

## 2013-10-22 DIAGNOSIS — A088 Other specified intestinal infections: Secondary | ICD-10-CM

## 2013-10-22 DIAGNOSIS — A084 Viral intestinal infection, unspecified: Secondary | ICD-10-CM

## 2013-10-22 MED ORDER — PROMETHAZINE HCL 25 MG PO TABS: 25.0000 mg | ORAL_TABLET | Freq: Four times a day (QID) | ORAL | Status: DC | PRN

## 2013-10-22 MED ORDER — ONDANSETRON 4 MG PO TBDP
ORAL_TABLET | ORAL | Status: AC
  Filled 2013-10-22: qty 2

## 2013-10-22 MED ORDER — ONDANSETRON 4 MG PO TBDP
8.0000 mg | ORAL_TABLET | Freq: Once | ORAL | Status: AC
  Administered 2013-10-22: 8 mg via ORAL

## 2013-10-22 MED ORDER — LOPERAMIDE HCL 2 MG PO CAPS: 2.0000 mg | ORAL_CAPSULE | Freq: Four times a day (QID) | ORAL | Status: DC | PRN

## 2013-10-22 NOTE — ED Provider Notes (Signed)
Medical screening examination/treatment/procedure(s) were performed by non-physician practitioner and as supervising physician I was immediately available for consultation/collaboration.  Leslee Home, M.D.   Reuben Likes, MD 10/22/13 [phone]

## 2013-10-22 NOTE — ED Notes (Addendum)
Pt c/o vomiting and diarrhea onset yest night Has vomited x6 and has had x4 loose stool since then Unable to hold down her food... Decreased appetite and a "queezy" stomach  Denies: fevers, cold sxs, new meds/foods, urinary sxs She is alert w/no signs of acute distress.

## 2013-10-22 NOTE — ED Provider Notes (Signed)
CSN: [id number]     Arrival date & time 10/22/13  0801 History   None    Chief Complaint  Patient presents with  . Emesis   (Consider location/radiation/quality/duration/timing/severity/associated sxs/prior Treatment) HPI Comments: 32 year old female presents complaining of nausea, vomiting, diarrhea, abdominal pain. This began yesterday afternoon. She is not been able to hold down any food or liquids since this began. She had one leftover Phenergan from when she had this before and she took it last night, it did help. 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 She denies fever, chills, recent travel. No blood in vomitus or diarrhea.  Patient is a 32 y.o. female presenting with vomiting.  Emesis Associated symptoms: abdominal pain and diarrhea   Associated symptoms: no arthralgias, no chills and no myalgias     Past Medical History  Diagnosis Date  . PVC (premature ventricular contraction)   . Palpitations   . Pre-syncope   . Tachycardia   . Depression   . Insomnia   . Seasonal allergies   . Asthma   . Anxiety    Past Surgical History  Procedure Laterality Date  . Cesarean section  10/15/2009, 02/08/2006    times 2   Family History  Problem Relation Age of Onset  . Heart disease Father   . Rheumatologic disease Mother   . Lung cancer Paternal Grandmother    History  Substance Use Topics  . Smoking status: Never Smoker   . Smokeless tobacco: Not on file     Comment: reports "tried it as a teenager" but has not smoked since  . Alcohol Use: No   OB History   Grav Para Term Preterm Abortions TAB SAB Ect Mult Living                 Review of Systems  Constitutional: Negative for fever and chills.  Eyes: Negative for visual disturbance.  Respiratory: Negative for cough and shortness of breath.   Cardiovascular: Negative for chest pain, palpitations and leg swelling.  Gastrointestinal: Positive for nausea, vomiting, abdominal pain and diarrhea.  Negative for blood in stool and anal bleeding.  Endocrine: Negative for polydipsia and polyuria.  Genitourinary: Negative for dysuria, urgency and frequency.  Musculoskeletal: Negative for arthralgias and myalgias.  Skin: Negative for rash.  Neurological: Negative for dizziness, weakness and light-headedness.    Allergies  Penicillins  Home Medications   Current Outpatient Rx  Name  Route  Sig  Dispense  Refill  . ALPRAZolam (XANAX) 0.5 MG tablet   Oral   Take 1 mg by mouth at bedtime as needed. For anxiety         . beclomethasone (QVAR) 80 MCG/ACT inhaler   Inhalation   Inhale 1 puff into the lungs 2 (two) times daily.   1 Inhaler   6   . citalopram (CELEXA) 10 MG tablet   Oral   Take 10 mg by mouth daily.         Marland Kitchen levonorgestrel (MIRENA) 20 MCG/24HR IUD   Intrauterine   1 each by Intrauterine route once.         . mirtazapine (REMERON) 15 MG tablet   Oral   Take 45 mg by mouth at bedtime.          . mometasone-formoterol (DULERA) 200-5 MCG/ACT AERO   Inhalation   Inhale 2 puffs into the lungs 2 (two) times daily.   1 Inhaler   6   . montelukast (SINGULAIR) 10 MG tablet  Oral   Take 1 tablet (10 mg total) by mouth at bedtime.   30 tablet   6   . predniSONE (DELTASONE) 10 MG tablet      Take 4 tabs daily w/ food x 7 days, 3 tabs daily x 7 days, 2 tabs daily x 7 days, 1 tab daily and stay   40 tablet   1   . albuterol (PROVENTIL HFA;VENTOLIN HFA) 108 (90 BASE) MCG/ACT inhaler   Inhalation   Inhale 2 puffs into the lungs every 4 (four) hours as needed (For shortness of breath).   1 Inhaler   6   . albuterol (PROVENTIL) (5 MG/ML) 0.5% nebulizer solution   Nebulization   Take 5 mg by nebulization every 6 (six) hours as needed. For wheeze and shortness of breath         . benzonatate (TESSALON) 100 MG capsule   Oral   Take 1 capsule (100 mg total) by mouth 3 (three) times daily as needed for cough.   30 capsule   0   . cetirizine (ZYRTEC)  10 MG tablet   Oral   Take 10 mg by mouth daily.         . chlorpheniramine-HYDROcodone (TUSSIONEX) 10-8 MG/5ML LQCR   Oral   Take 5 mLs by mouth every 12 (twelve) hours as needed (cough).   480 mL   0   . cyclobenzaprine (FLEXERIL) 5 MG tablet   Oral   Take 1 tablet (5 mg total) by mouth 2 (two) times daily as needed for muscle spasms. For muscle spasms   60 tablet   0   . loperamide (IMODIUM) 2 MG capsule   Oral   Take 1 capsule (2 mg total) by mouth 4 (four) times daily as needed for diarrhea or loose stools.   12 capsule   0   . pantoprazole (PROTONIX) 40 MG tablet   Oral   Take 1 tablet (40 mg total) by mouth 2 (two) times daily.   60 tablet   4   . predniSONE (DELTASONE) 5 MG tablet      Take as directed   60 tablet   0   . promethazine (PHENERGAN) 25 MG tablet   Oral   Take 1 tablet (25 mg total) by mouth every 6 (six) hours as needed for nausea or vomiting.   12 tablet   0    BP 131/96  Pulse 108  Temp(Src) 97.6 F (36.4 C) (Oral)  Resp 18  SpO2 99%  LMP 10/22/2013 Physical Exam  Nursing note and vitals reviewed. Constitutional: She is oriented to person, place, and time. Vital signs are normal. She appears well-developed and well-nourished. No distress.  HENT:  Head: Normocephalic and atraumatic.  Pulmonary/Chest: Effort normal. No respiratory distress.  Abdominal: Soft. She exhibits no mass. There is no tenderness. There is no rebound and no guarding.  Neurological: She is alert and oriented to person, place, and time. She has normal strength. Coordination normal.  Skin: Skin is warm and dry. No rash noted. She is not diaphoretic.  Psychiatric: She has a normal mood and affect. Judgment normal.    ED Course  Procedures (including critical care time) Labs Review Labs Reviewed - No data to display Imaging Review No results found.    MDM   1. Viral gastroenteritis    Not orthostatic. She prefers oral rehydration, declines IV fluids.  Push fluids, Phenergan and Imodium as needed, followup if not improving after a few days.  Meds ordered this encounter  Medications  . ondansetron (ZOFRAN-ODT) disintegrating tablet 8 mg    Sig:   . promethazine (PHENERGAN) 25 MG tablet    Sig: Take 1 tablet (25 mg total) by mouth every 6 (six) hours as needed for nausea or vomiting.    Dispense:  12 tablet    Refill:  0    Order Specific Question:  Supervising Provider    Answer:  Lorenz Coaster, DAVID C [id number]  . loperamide (IMODIUM) 2 MG capsule    Sig: Take 1 capsule (2 mg total) by mouth 4 (four) times daily as needed for diarrhea or loose stools.    Dispense:  12 capsule    Refill:  0    Order Specific Question:  Supervising Provider    Answer:  Lorenz Coaster, DAVID C [[id number]       Graylon Good, PA-C 10/22/13 [phone]

## 2013-11-12 ENCOUNTER — Ambulatory Visit (INDEPENDENT_AMBULATORY_CARE_PROVIDER_SITE_OTHER): Payer: 59 | Admitting: Family Medicine

## 2013-11-12 VITALS — BP 124/92 | HR 124 | Temp 97.4°F | Resp 16 | Ht 63.5 in | Wt 160.2 lb

## 2013-11-12 DIAGNOSIS — J111 Influenza due to unidentified influenza virus with other respiratory manifestations: Secondary | ICD-10-CM

## 2013-11-12 DIAGNOSIS — M899 Disorder of bone, unspecified: Secondary | ICD-10-CM

## 2013-11-12 DIAGNOSIS — M898X1 Other specified disorders of bone, shoulder: Secondary | ICD-10-CM

## 2013-11-12 MED ORDER — CYCLOBENZAPRINE HCL 10 MG PO TABS: 10.0000 mg | ORAL_TABLET | Freq: Three times a day (TID) | ORAL | Status: DC | PRN

## 2013-11-12 MED ORDER — OSELTAMIVIR PHOSPHATE 75 MG PO CAPS: 75.0000 mg | ORAL_CAPSULE | Freq: Two times a day (BID) | ORAL | Status: DC

## 2013-11-12 MED ORDER — ONDANSETRON HCL 4 MG PO TABS: 4.0000 mg | ORAL_TABLET | Freq: Three times a day (TID) | ORAL | Status: DC | PRN

## 2013-11-12 NOTE — Progress Notes (Signed)
   Subjective:    Patient ID: Kelsey [name], female    DOB: January 23, 1981, 32 y.o.   MRN: [id number]  HPI Patient presents for acute illness. 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 She has had fever, chills, vomiting, diarrhea, and myalgias. Has been sick for 2 days. Concerned she might have the flu. Has had Tmax to 100.5. Has been drinking fluids okay. Did get flu shot this year. Constant nausea, some vomiting. Not taking any meds. No bloody vomit or stool.   Also here about left shoulder pain. Has been bothering her for > 1 year. Ortho told her it was just "a muscle". Had subacromial injection that helped for several months. Also took vicodin prn. Bothers her at night after working a lot. NSAIDS dont seem to help.    Review of Systems  All other systems reviewed and are negative.       Objective:   Physical Exam  Constitutional: She is oriented to person, place, and time. She appears well-developed and well-nourished. No distress.  HENT:  Head: Normocephalic and atraumatic.  Right Ear: External ear normal.  Left Ear: External ear normal.  Mouth/Throat: Oropharynx is clear and moist.  Eyes: Conjunctivae are normal. Pupils are equal, round, and reactive to light. No scleral icterus.  Neck: Normal range of motion.  Cardiovascular: Normal rate and regular rhythm.   No murmur heard. Pulmonary/Chest: Effort normal and breath sounds normal. No respiratory distress. She has no wheezes.  Abdominal: Soft. She exhibits no distension. There is no tenderness.  Musculoskeletal: Normal range of motion.       Left shoulder: She exhibits pain and spasm. She exhibits normal range of motion, no tenderness, no bony tenderness, no swelling and no deformity.  Lymphadenopathy:    She has no cervical adenopathy.  Neurological: She is alert and oriented to person, place, and time.  Skin: Skin is warm and dry.  Psychiatric: She has a normal mood and affect. Her behavior is normal.      Assessment & Plan:  #1. Flu-like  illness - Based on severe asthma requiring oral steriods, will go ahead and treat for flu with tamiflu - Out of work until better for 48 hrs. - NSAIDs and zofran prn - return precautions  #2. Left periscapular pain, chronic - Refer PT - NSAIDs prn - flexeril prn - f/u prn, consider MRI if still persistent

## 2013-11-12 NOTE — Patient Instructions (Signed)
Thank you for coming in today  For flu: - start tamiflu - zofran as needed for nausea - ibuprofen and tylenol for fever and body aches  For shoulder: - Physical therapy - Heat - Ibuprofen - Flexeril - Consider MRI of shoulder if persists  Influenza, Adult Influenza ("the flu") is a viral infection of the respiratory tract. It occurs more often in winter months because people spend more time in close contact with one another. Influenza can make you feel very sick. Influenza easily spreads from person to person (contagious). CAUSES  Influenza is caused by a virus that infects the respiratory tract. You can catch the virus by breathing in droplets from an infected person's cough or sneeze. You can also catch the virus by touching something that was recently contaminated with the virus and then touching your mouth, nose, or eyes. SYMPTOMS  Symptoms typically last 4 to 10 days and may include:  Fever.  Chills.  Headache, body aches, and muscle aches.  Sore throat.  Chest discomfort and cough.  Poor appetite.  Weakness or feeling tired.  Dizziness.  Nausea or vomiting. DIAGNOSIS  Diagnosis of influenza is often made based on your history and a physical exam. A nose or throat swab test can be done to confirm the diagnosis. RISKS AND COMPLICATIONS You may be at risk for a more severe case of influenza if you smoke cigarettes, have diabetes, have chronic heart disease (such as heart failure) or lung disease (such as asthma), or if you have a weakened immune system. Elderly people and pregnant women are also at risk for more serious infections. The most common complication of influenza is a lung infection (pneumonia). Sometimes, this complication can require emergency medical care and may be life-threatening. PREVENTION  An annual influenza vaccination (flu shot) is the best way to avoid getting influenza. An annual flu shot is now routinely recommended for all adults in the  [place] TREATMENT  In mild cases, influenza goes away on its own. Treatment is directed at relieving symptoms. For more severe cases, your caregiver may prescribe antiviral medicines to shorten the sickness. Antibiotic medicines are not effective, because the infection is caused by a virus, not by bacteria. HOME CARE INSTRUCTIONS  Only take over-the-counter or prescription medicines for pain, discomfort, or fever as directed by your caregiver.  Use a cool mist humidifier to make breathing easier.  Get plenty of rest until your temperature returns to normal. This usually takes 3 to 4 days.  Drink enough fluids to keep your urine clear or pale yellow.  Cover your mouth and nose when coughing or sneezing, and wash your hands well to avoid spreading the virus.  Stay home from work or school until your fever has been gone for at least 1 full day. SEEK MEDICAL CARE IF:   You have chest pain or a deep cough that worsens or produces more mucus.  You have nausea, vomiting, or diarrhea. SEEK IMMEDIATE MEDICAL CARE IF:   You have difficulty breathing, shortness of breath, or your skin or nails turn bluish.  You have severe neck pain or stiffness.  You have a severe headache, facial pain, or earache.  You have a worsening or recurring fever.  You have nausea or vomiting that cannot be controlled. MAKE SURE YOU:  Understand these instructions.  Will watch your condition.  Will get help right away if you are not doing well or get worse. Document Released: 11/10/2000 Document Revised: 05/14/2012 Document Reviewed: 02/12/2012 [employer] Patient Information 2014  [employer]

## 2013-11-17 ENCOUNTER — Encounter: Payer: Self-pay | Admitting: Internal Medicine

## 2013-11-17 IMAGING — CR DG CHEST 1V PORT
1 series · 1 of 1 positions shown · non-contrast
Comparison: 02/10/2012

CLINICAL DATA: Cough, shortness of breath, asthma, initial
encounter.

PORTABLE CHEST - 1 VIEW

[AP]
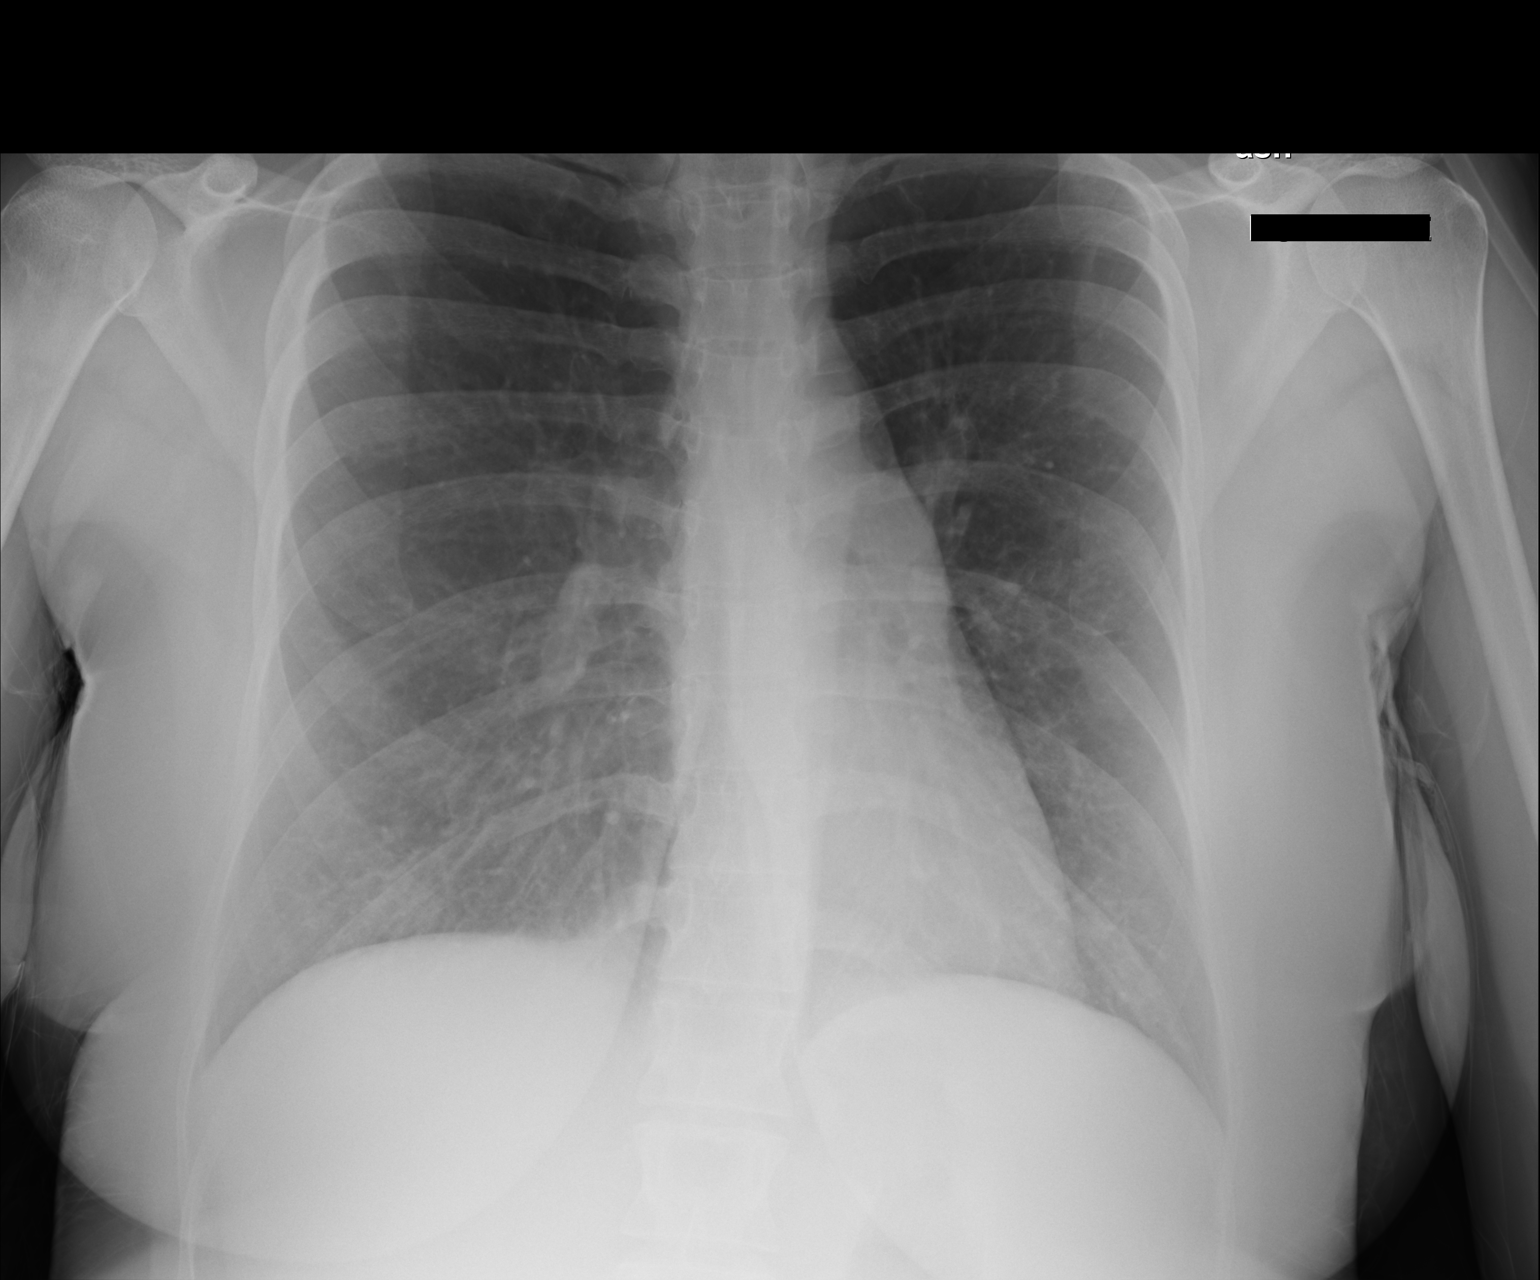

[1 of 1 positions shown; findings below may reference images not displayed]

FINDINGS: Grossly unchanged cardiac silhouette and mediastinal
contours.  Improved aeration of the right infrahilar lung.  Lungs
appear mildly hyperexpanded with mild diffuse slightly nodular
thickening of the interstitium.  No focal airspace opacity.  No
pleural effusion or pneumothorax.  No evidence of edema.  Grossly
unchanged bones including mild scoliotic curvature of the thoracic
spine, possibly positional.
IMPRESSION: Mild lung hyperexpansion and bronchitic change without acute
cardiopulmonary disease.

## 2013-12-01 ENCOUNTER — Encounter: Payer: Self-pay | Admitting: Adult Health

## 2013-12-01 ENCOUNTER — Ambulatory Visit (INDEPENDENT_AMBULATORY_CARE_PROVIDER_SITE_OTHER): Payer: 59 | Admitting: Adult Health

## 2013-12-01 VITALS — BP 114/84 | HR 94 | Temp 97.8°F | Ht 64.0 in | Wt 161.0 lb

## 2013-12-01 DIAGNOSIS — J45901 Unspecified asthma with (acute) exacerbation: Secondary | ICD-10-CM

## 2013-12-01 MED ORDER — PREDNISONE 5 MG PO TABS: ORAL_TABLET | ORAL | Status: DC

## 2013-12-01 NOTE — Patient Instructions (Signed)
Decrease Prednisone 5mg  daily for 2 weeks  Beginning 12/16/13 Decrease Prednisone 5mg  every other day for 2 weeks -then stop .  Continue on Dulera and QVAR .  Follow up Dr. Vassie LollAlva  In 6 weeks and As needed   Please contact office for sooner follow up if symptoms do not improve or worsen or seek emergency care

## 2013-12-01 NOTE — Progress Notes (Signed)
  Subjective:    Patient ID: Kelsey [name], female    DOB: 06/26/1981, 33 y.o.   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  She reports mild intermittent symptoms since childhood only requiring albuterol when necessary. She had frequent flares in 2013, stable x 1 yr, & again flares since 06/2013. Singulair was started 04/26/2012 in addition to Advair 250/50 - advair changed to dulera 08/2013 Asthma triggers include heat humidity allergies and cats  Environment - she has lived in the same house for the last 5 years. She has a dog for 10 years. She has perennial allergies and took allergy shots until age 33. Her symptoms are not worse at work and she does not describe any triggers at work. She reports anxiety and depression and recent marital stress. She has kids aged 5 and 2.  Of note she had a rheumatological evaluation by Dr. Nickola MajorHawkes, ANA, RA factor was negative sedimentation rate was 3, CPK was 44, thyroid function was normal  Allergy testing - High titers to cat & dog dander, grass & house dust  PFTs in November 2013 showed FEV1 of 1.87-59% improving to 2.24-70% with bronchodilator.     10/20/13 Chief Complaint  Patient presents with  . Follow-up    6 week follow up Asthma - reports breathing is doing well overall but still having lingering dry cough.  no other new complaints.  Patient had a severe asthma exacerbation requiring hospitalization September 24-26. Due to her frequent symptoms we decided on an extended pred taper on last visit 08/2013  Cough is better still have clear -yellow mucus, . Denies any fever, no chills, no sweats.  Chest pain-resolved  Compliant with meds incl dulera - No change in home or work environment, working at [employer] more  Reflux controlled on protonix Down to 20 mg pred -back to baseline , no nocturnal symptoms >>decrease pred slow   12/01/2013 Follow up  Returns for 1 month follow up .   Says she slowly decreased prednisone but go flu right before holidays and was only able to go down to 10mg  daily  Reports breathing is doing well overall but still having lingering dry cough.  no other new complaints. Says she is back to her normal now. No flare of wheezing or cough.  Uses albuterol ~2 x week.  Remains on QVAR and Dulera .     Review of Systems  neg for any significant sore throat, dysphagia, itching, sneezing, nasal congestion or excess/ purulent secretions, fever, chills, sweats, unintended wt loss, pleuritic or exertional cp, hempoptysis, orthopnea pnd or change in chronic leg swelling. Also denies presyncope, palpitations, heartburn, abdominal pain, nausea, vomiting, diarrhea or change in bowel or urinary habits, dysuria,hematuria, rash, arthralgias, visual complaints, headache, numbness weakness or ataxia.     Objective:   Physical Exam   Gen. Pleasant, well-nourished, in no distress ENT - no lesions, no post nasal drip Neck: No JVD, no thyromegaly, no carotid bruits Lungs: no use of accessory muscles, no dullness to percussion, clear without rales or rhonchi  Cardiovascular: Rhythm regular, heart sounds  normal, no murmurs or gallops, no peripheral edema Musculoskeletal: No deformities, no cyanosis or clubbing        Assessment & Plan:

## 2013-12-01 NOTE — Assessment & Plan Note (Signed)
Recent flare , now resolving after Flu like illlness  Plan  Decrease Prednisone 5mg  daily for 2 weeks  Beginning 12/16/13 Decrease Prednisone 5mg  every other day for 2 weeks -then stop .  Continue on Dulera and QVAR .  Follow up Dr. Vassie LollAlva  In 6 weeks and As needed   Please contact office for sooner follow up if symptoms do not improve or worsen or seek emergency care

## 2013-12-25 NOTE — Progress Notes (Signed)
Reviewed documentation and agree w/ assessment and plan. Carleigh Buccieri, MD MPH 

## 2014-01-01 ENCOUNTER — Telehealth: Payer: Self-pay | Admitting: Pulmonary Disease

## 2014-01-01 NOTE — Telephone Encounter (Signed)
Called spoke with pt. She c/p prod cough w/ yellow phlem and wheezing x yesterday. She reports she has been taking OTC cough syrups but not helping. Please advise RA thanks  Allergies  Allergen Reactions  . Penicillins Rash

## 2014-01-01 NOTE — Telephone Encounter (Signed)
lmomtcb x1 for pt Also per RA if she feels she is having asthma flare does she wants prednisone?

## 2014-01-01 NOTE — Telephone Encounter (Signed)
zpak Hycodan 5ml po qhs prn x 60 ml if cough is really bothersome

## 2014-01-02 ENCOUNTER — Telehealth: Payer: Self-pay | Admitting: Pulmonary Disease

## 2014-01-02 ENCOUNTER — Other Ambulatory Visit: Payer: Self-pay | Admitting: *Deleted

## 2014-01-02 MED ORDER — AZITHROMYCIN 250 MG PO TABS: ORAL_TABLET | ORAL | Status: DC

## 2014-01-02 MED ORDER — HYDROCODONE-HOMATROPINE 5-1.5 MG/5ML PO SYRP: 5.0000 mL | ORAL_SOLUTION | Freq: Every evening | ORAL | Status: DC | PRN

## 2014-01-02 NOTE — Telephone Encounter (Signed)
Hycodan 5ml po qhs prn x 60 ml if cough is really bothersome --  RX printed and will have RA sign this afternoon. She will pick up after 2 PM. Nothing further needed

## 2014-01-02 NOTE — Telephone Encounter (Signed)
Called and spoke with pt. She would like ZPAK called in. rx sent. Nothing further needed

## 2014-01-02 NOTE — Telephone Encounter (Signed)
Returning call can be reached at [phone].Raylene EvertsJuanita S Davis

## 2014-01-12 ENCOUNTER — Ambulatory Visit: Payer: 59 | Admitting: Pulmonary Disease

## 2014-01-16 ENCOUNTER — Encounter: Payer: Self-pay | Admitting: Adult Health

## 2014-01-16 ENCOUNTER — Ambulatory Visit (INDEPENDENT_AMBULATORY_CARE_PROVIDER_SITE_OTHER): Payer: 59 | Admitting: Adult Health

## 2014-01-16 VITALS — BP 124/72 | HR 103 | Temp 98.3°F | Ht 64.0 in | Wt 167.4 lb

## 2014-01-16 DIAGNOSIS — J45901 Unspecified asthma with (acute) exacerbation: Secondary | ICD-10-CM

## 2014-01-16 NOTE — Progress Notes (Signed)
Subjective:    Patient ID: Kelsey [name], female    DOB: [birth year], 33 y.o.   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  She reports mild intermittent symptoms since childhood only requiring albuterol when necessary. She had frequent flares in 2013, stable x 1 yr, & again flares since 06/2013. Singulair was started 04/26/2012 in addition to Advair 250/50 - advair changed to dulera 08/2013 Asthma triggers include heat humidity allergies and cats  Environment - she has lived in the same house for the last 5 years. She has a dog for 10 years. She has perennial allergies and took allergy shots until age 11. Her symptoms are not worse at work and she does not describe any triggers at work. She reports anxiety and depression and recent marital stress. She has kids aged 5 and 2.  Of note she had a rheumatological evaluation by Dr. Nickola Major, ANA, RA factor was negative sedimentation rate was 3, CPK was 44, thyroid function was normal  Allergy testing - High titers to cat & dog dander, grass & house dust  PFTs in November 2013 showed FEV1 of 1.87-59% improving to 2.24-70% with bronchodilator.     10/20/13 Chief Complaint  Patient presents with  . Follow-up    RA pt here for 6 week follow up Asthma - reports breathing is improved since last ov, but does report some lingering cough and head congestion with yellow mucus production.  denies any wheezing, dyspnea, chest tightness, f/c/s, hemoptysis, nausea, vomiting.  Patient had a severe asthma exacerbation requiring hospitalization September 24-26. Due to her frequent symptoms we decided on an extended pred taper on last visit 08/2013  Cough is better still have clear -yellow mucus, . Denies any fever, no chills, no sweats.  Chest pain-resolved  Compliant with meds incl dulera - No change in home or work environment, working at [employer] more  Reflux controlled on protonix Down to 20  mg pred -back to baseline , no nocturnal symptoms >>decrease pred slow   12/01/13 Follow up  Returns for 1 month follow up .  Says she slowly decreased prednisone but go flu right before holidays and was only able to go down to 10mg  daily  Reports breathing is doing well overall but still having lingering dry cough.  no other new complaints. Says she is back to her normal now. No flare of wheezing or cough.  Uses albuterol ~2 x week.  Remains on QVAR and Dulera .  >slow pred taper to off   01/16/2014 Follow up  RA pt here for 6 week follow up Asthma - reports breathing is improved since last ov, but does report some lingering cough and head congestion with yellow mucus production-from a recent URI-this is resolving.  Denies any wheezing, dyspnea, chest tightness, f/c/s, hemoptysis, nausea, vomiting. Has tapered off prednisone , no flare in cough or wheezing.  No increased SABA use.    Review of Systems  neg for any significant sore throat, dysphagia, itching, sneezing, nasal congestion or excess/ purulent secretions, fever, chills, sweats, unintended wt loss, pleuritic or exertional cp, hempoptysis, orthopnea pnd or change in chronic leg swelling. Also denies presyncope, palpitations, heartburn, abdominal pain, nausea, vomiting, diarrhea or change in bowel or urinary habits, dysuria,hematuria, rash, arthralgias, visual complaints, headache, numbness weakness or ataxia.     Objective:   Physical Exam   Gen. Pleasant, well-nourished, in no distress ENT - no lesions,  no post nasal drip Neck: No JVD, no thyromegaly, no carotid bruits Lungs: no use of accessory muscles, no dullness to percussion, clear without rales or rhonchi  Cardiovascular: Rhythm regular, heart sounds  normal, no murmurs or gallops, no peripheral edema Musculoskeletal: No deformities, no cyanosis or clubbing        Assessment & Plan:

## 2014-01-16 NOTE — Assessment & Plan Note (Signed)
Compensated without flare off steroids   Plan  Continue on Dulera and QVAR . Rinse after use.  Follow up Dr. Vassie LollAlva  In 3 months  weeks and As needed   Please contact office for sooner follow up if symptoms do not improve or worsen or seek emergency care

## 2014-01-16 NOTE — Patient Instructions (Signed)
Continue on Dulera and QVAR . Rinse after use.  Follow up Dr. Vassie LollAlva  In 3 months  weeks and As needed   Please contact office for sooner follow up if symptoms do not improve or worsen or seek emergency care

## 2014-03-11 ENCOUNTER — Encounter: Payer: Self-pay | Admitting: Pulmonary Disease

## 2014-03-11 MED ORDER — BECLOMETHASONE DIPROPIONATE 80 MCG/ACT IN AERS: 1.0000 | INHALATION_SPRAY | Freq: Two times a day (BID) | RESPIRATORY_TRACT | Status: DC

## 2014-03-11 MED ORDER — ALBUTEROL SULFATE HFA 108 (90 BASE) MCG/ACT IN AERS: 2.0000 | INHALATION_SPRAY | RESPIRATORY_TRACT | Status: DC | PRN

## 2014-03-11 MED ORDER — MOMETASONE FURO-FORMOTEROL FUM 200-5 MCG/ACT IN AERO: 2.0000 | INHALATION_SPRAY | Freq: Two times a day (BID) | RESPIRATORY_TRACT | Status: DC

## 2014-03-26 ENCOUNTER — Ambulatory Visit: Payer: 59

## 2014-03-26 ENCOUNTER — Ambulatory Visit (INDEPENDENT_AMBULATORY_CARE_PROVIDER_SITE_OTHER): Payer: 59 | Admitting: Emergency Medicine

## 2014-03-26 VITALS — BP 126/82 | HR 95 | Temp 98.0°F | Resp 14 | Ht 63.3 in | Wt 166.0 lb

## 2014-03-26 DIAGNOSIS — M25512 Pain in left shoulder: Secondary | ICD-10-CM

## 2014-03-26 DIAGNOSIS — M62838 Other muscle spasm: Secondary | ICD-10-CM

## 2014-03-26 DIAGNOSIS — M25519 Pain in unspecified shoulder: Secondary | ICD-10-CM

## 2014-03-26 MED ORDER — HYDROCODONE-ACETAMINOPHEN 5-325 MG PO TABS: 1.0000 | ORAL_TABLET | Freq: Four times a day (QID) | ORAL | Status: DC | PRN

## 2014-03-26 MED ORDER — CYCLOBENZAPRINE HCL 10 MG PO TABS: ORAL_TABLET | ORAL | Status: DC

## 2014-03-26 MED ORDER — HYDROCODONE-ACETAMINOPHEN 10-325 MG PO TABS: 1.0000 | ORAL_TABLET | Freq: Three times a day (TID) | ORAL | Status: DC | PRN

## 2014-03-26 NOTE — Progress Notes (Signed)
  This chart was scribed for [name] MD by [name], ED Scribe. This patient was seen in room 3 and the patient's care was started at 11:40 AM .  Subjective:    Patient ID: Kelsey [name], female    DOB: Mar 15, 1981, 33 y.o.   MRN: [id number]  HPI   HPI Comments: Kelsey [name] is a 33 y.o. female who presents to the Emergency Department complaining of recurrent left shoulder pain that has "flared up again". Pt states that the pain has been present for years. She reports that she has been to physical therapy and has received shots, she reports minimal effect as the pain has returned. Her husband has given her massages that have provided short term relief.    Review of Systems  Musculoskeletal: Positive for arthralgias.       Left shoulder pain        Objective:   Physical Exam   CONSTITUTIONAL: Well developed/well nourished HEAD: Normocephalic/atraumatic EYES: EOMI/PERRL ENMT: Mucous membranes moist NECK: supple no meningeal signs. FROM SPINE:entire spine nontender CV: S1/S2 noted, no murmurs/rubs/gallops noted LUNGS: Lungs are clear to auscultation bilaterally, no apparent distress ABDOMEN: soft, nontender, no rebound or guarding GU:no cva tenderness NEURO: Pt is awake/alert, moves all extremitiesx4 EXTREMITIES: pulses normal, full ROM SKIN: warm, color normal PSYCH: no abnormalities of mood noted MUSCULOSKELETAL: Tender along the border of the scapula, she has pain with full abduction of her left arm. Her other rotator cuff tests are normal.   Filed Vitals:   03/26/14 1044  BP: 126/82  Pulse: 95  Temp: 98 F (36.7 C)  Resp: 14   [facility] reading (PRIMARY) by  Dr.Danyel Tobey there is reversal of the cervical curvature. Shoulder films are normal to         Assessment & Plan:  We'll treat with hydrocodone ,Flexeril at night advised patient to consider either chiropractic massage or referral for physical therapy.      I personally performed the  services described in this documentation, which was scribed in my presence. The recorded information has been reviewed and is accurate.

## 2014-03-26 NOTE — Patient Instructions (Signed)
Please make an appointment to see Dr. Mayford KnifeWilliams

## 2014-04-08 ENCOUNTER — Ambulatory Visit (INDEPENDENT_AMBULATORY_CARE_PROVIDER_SITE_OTHER): Payer: 59 | Admitting: Pulmonary Disease

## 2014-04-08 ENCOUNTER — Encounter: Payer: Self-pay | Admitting: Pulmonary Disease

## 2014-04-08 VITALS — BP 122/90 | HR 81 | Temp 97.8°F | Ht 64.0 in | Wt 172.4 lb

## 2014-04-08 DIAGNOSIS — J45901 Unspecified asthma with (acute) exacerbation: Secondary | ICD-10-CM

## 2014-04-08 NOTE — Assessment & Plan Note (Signed)
Stay on dulera & singulair Attempt step down -Decrease Qvar to 1 puff daily in June & if doing well, can stop in July -call to report

## 2014-04-08 NOTE — Patient Instructions (Signed)
Stay on dulera & singulair Decrease Qvar to 1 puff daily in June & if doing well, can stop in July -call to report

## 2014-04-08 NOTE — Progress Notes (Signed)
   Subjective:    Patient ID: Chrystie NoseJennifer L Keen, female    DOB: 1981-07-03, 33 y.o.   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  She reports mild intermittent symptoms since childhood only requiring albuterol when necessary. She had frequent flares in 2013, stable x 1 yr, & again flares since 06/2013. Singulair was started 04/26/2012 in addition to Advair 250/50 - advair changed to dulera 08/2013 Asthma triggers include heat humidity allergies and cats  Environment - she has lived in the same house since 2009. She has a dog since 2004. She has perennial allergies and took allergy shots until age 33. Her symptoms are not worse at work and she does not describe any triggers at work. She reports anxiety and depression and recent marital stress.  Of note she had a rheumatological evaluation by Dr. Nickola MajorHawkes, ANA, RA factor was negative sedimentation rate was 3, CPK was 44, thyroid function was normal  Allergy testing - High titers to cat & dog dander, grass & house dust  PFTs in November 2013 showed FEV1 of 1.87-59% improving to 2.24-70% with bronchodilator.    Sep 2014 severe asthma exacerbation requiring hospitalization. Due to her frequent symptoms we decided on an extended pred taper until jan 2015     04/08/2014  Chief Complaint  Patient presents with  . Follow-up    Pt states asthma has been doing well, has had one flare up- pt had cold 1 month ago with congestion and productive cough. Denies recent cough, SOB, or CP.   Uses albuterol ~once x week.  Remains on QVAR and Dulera .  Denies any wheezing, dyspnea, chest tightness, f/c/s, hemoptysis, nausea, vomiting.  Has tapered off prednisone since jan 2015 , no flare in cough or wheezing.  No increased SABA use.  Allergy season has not been bad  Review of Systems neg for any significant sore throat, dysphagia, itching, sneezing, nasal congestion or excess/ purulent  secretions, fever, chills, sweats, unintended wt loss, pleuritic or exertional cp, hempoptysis, orthopnea pnd or change in chronic leg swelling. Also denies presyncope, palpitations, heartburn, abdominal pain, nausea, vomiting, diarrhea or change in bowel or urinary habits, dysuria,hematuria, rash, arthralgias, visual complaints, headache, numbness weakness or ataxia.     Objective:   Physical Exam  Gen. Pleasant, well-nourished, in no distress ENT - no lesions, no post nasal drip Neck: No JVD, no thyromegaly, no carotid bruits Lungs: no use of accessory muscles, no dullness to percussion, clear without rales or rhonchi  Cardiovascular: Rhythm regular, heart sounds  normal, no murmurs or gallops, no peripheral edema Musculoskeletal: No deformities, no cyanosis or clubbing        Assessment & Plan:

## 2014-05-25 ENCOUNTER — Telehealth: Payer: Self-pay | Admitting: Pulmonary Disease

## 2014-05-25 NOTE — Telephone Encounter (Signed)
lmomtcb x1 for pt 

## 2014-05-25 NOTE — Telephone Encounter (Signed)
I signed & returned

## 2014-05-25 NOTE — Telephone Encounter (Signed)
I have not seen any FMLA paperwork on pt. Dr. Vassie LollAlva do you have this? Thanks

## 2014-05-26 NOTE — Telephone Encounter (Signed)
Pt returned call; advised RA has already completed and returned her FMLA paperwork.  She will call [employer] to see if she needs to pick this up or if they fax it for her.  Nothing further needed at this time; will sign off.

## 2014-06-03 ENCOUNTER — Encounter: Payer: Self-pay | Admitting: Pulmonary Disease

## 2014-06-08 ENCOUNTER — Telehealth: Payer: Self-pay | Admitting: Pulmonary Disease

## 2014-06-08 MED ORDER — HYDROCOD POLST-CHLORPHEN POLST 10-8 MG/5ML PO LQCR: 5.0000 mL | Freq: Two times a day (BID) | ORAL | Status: DC | PRN

## 2014-06-08 NOTE — Telephone Encounter (Signed)
Pt requesting refill of Tussionex Syrup Pt c/o increased cough and congestion, unable to sleep Last filled 09/24/13 480 mL  Pt aware she will need to pick this up.  Please advise TP if this can be refilled. Thanks.

## 2014-06-08 NOTE — Telephone Encounter (Signed)
Per TP: okay for the Tussionex.    Rx printed and signed by TP.  Pt is aware this has been placed up front for pick up.  Nothing further needed; will sign off.

## 2014-06-08 NOTE — Telephone Encounter (Signed)
Pt called to check on status of Rx; Called and spoke with Meghan and Boone MasterJessica Jones. Rx ready for pick up and patient is aware. She is on her way to the office.

## 2014-06-08 NOTE — Telephone Encounter (Signed)
lmomtcb x1 for pt 

## 2014-07-24 ENCOUNTER — Other Ambulatory Visit: Payer: Self-pay | Admitting: Emergency Medicine

## 2014-07-24 NOTE — Telephone Encounter (Signed)
Pt is needing also a refill on the pain medication

## 2014-08-04 ENCOUNTER — Ambulatory Visit (INDEPENDENT_AMBULATORY_CARE_PROVIDER_SITE_OTHER): Payer: 59 | Admitting: Family Medicine

## 2014-08-04 VITALS — BP 130/88 | HR 80 | Temp 98.0°F | Resp 18 | Ht 64.0 in | Wt 172.0 lb

## 2014-08-04 DIAGNOSIS — M25512 Pain in left shoulder: Secondary | ICD-10-CM

## 2014-08-04 DIAGNOSIS — M25519 Pain in unspecified shoulder: Secondary | ICD-10-CM

## 2014-08-04 MED ORDER — METAXALONE 800 MG PO TABS: ORAL_TABLET | ORAL | Status: DC

## 2014-08-04 MED ORDER — CYCLOBENZAPRINE HCL 10 MG PO TABS: 10.0000 mg | ORAL_TABLET | Freq: Three times a day (TID) | ORAL | Status: DC | PRN

## 2014-08-04 MED ORDER — HYDROCODONE-ACETAMINOPHEN 5-325 MG PO TABS: 1.0000 | ORAL_TABLET | Freq: Four times a day (QID) | ORAL | Status: DC | PRN

## 2014-08-04 NOTE — Progress Notes (Signed)
Subjective: Patient has a long history of problems with pain in her left shoulder. It flares up for no good reason. Knows of no specific injury. She was treated in April for this. The shoulder has flared again. She's tried various treatment modalities. It seems a taking a muscle relaxant primarily at bedtime helps her the most. Some she needs a stronger pain pill than the OTC medications. She is aware of the risks of opioid.  Objective: Pleasant lady in no major distress. Some tenderness medial to the left scapula. Full range of motion of her shoulder. Strength good.  Assessment: Left shoulder muscle strain and pain  Plan: Flexeril at night Skelaxin in the daytime if needed Her code oh for severe pain if needed  Return as necessary

## 2014-08-04 NOTE — Patient Instructions (Signed)
Continue the massage   Take the Flexeril one at bedtime (cyclobenzaprine)  Additionally you can take the Skelaxin one in the morning and one in the afternoon for a less sedating muscle relaxant   Alternate heat and ice  Try to stretch her shoulder  Return if further problem

## 2014-08-10 ENCOUNTER — Ambulatory Visit: Payer: 59 | Admitting: Adult Health

## 2014-08-13 ENCOUNTER — Telehealth: Payer: Self-pay | Admitting: Pulmonary Disease

## 2014-08-13 ENCOUNTER — Other Ambulatory Visit: Payer: Self-pay | Admitting: Pulmonary Disease

## 2014-08-13 MED ORDER — AZITHROMYCIN 250 MG PO TABS: ORAL_TABLET | ORAL | Status: DC

## 2014-08-13 MED ORDER — PREDNISONE 10 MG PO TABS: ORAL_TABLET | ORAL | Status: DC

## 2014-08-13 NOTE — Telephone Encounter (Signed)
Pt returning call.Kelsey [name] ° °

## 2014-08-13 NOTE — Telephone Encounter (Signed)
Called spoke with pt. We had already spoken earlier regarding medication refill.

## 2014-08-13 NOTE — Telephone Encounter (Signed)
Flare - yellow sputum, breathing worse Pl call in Z-pak & Prednisone 10 mg tabs  Take 2 tabs daily with food x 5ds, then 1 tab daily with food x 5ds then STOP Let pt know , once called

## 2014-08-13 NOTE — Telephone Encounter (Signed)
LMOM TCB x1 for pt at cell  Also tried work number, but no answer  What pharmacy would she like these sent to?

## 2014-08-13 NOTE — Telephone Encounter (Signed)
Pt aware RX's sent in

## 2014-08-14 ENCOUNTER — Ambulatory Visit: Payer: 59 | Admitting: Adult Health

## 2014-08-20 ENCOUNTER — Encounter: Payer: Self-pay | Admitting: Adult Health

## 2014-08-20 ENCOUNTER — Ambulatory Visit: Payer: 59 | Admitting: Adult Health

## 2014-08-20 ENCOUNTER — Ambulatory Visit (INDEPENDENT_AMBULATORY_CARE_PROVIDER_SITE_OTHER): Payer: 59 | Admitting: Adult Health

## 2014-08-20 VITALS — BP 124/80 | HR 84 | Temp 97.6°F | Ht 64.0 in | Wt 171.0 lb

## 2014-08-20 DIAGNOSIS — J45901 Unspecified asthma with (acute) exacerbation: Secondary | ICD-10-CM

## 2014-08-20 MED ORDER — HYDROCODONE-HOMATROPINE 5-1.5 MG/5ML PO SYRP: 5.0000 mL | ORAL_SOLUTION | Freq: Four times a day (QID) | ORAL | Status: DC | PRN

## 2014-08-20 MED ORDER — CLARITHROMYCIN ER 500 MG PO TB24: 1000.0000 mg | ORAL_TABLET | Freq: Every day | ORAL | Status: DC

## 2014-08-20 NOTE — Progress Notes (Signed)
Reviewed & agree with plan  

## 2014-08-20 NOTE — Patient Instructions (Signed)
Biaxin XL pack -take with food.  Eat yogurt daily .  Mucinex DM twice daily as needed. For cough, congestion. Hydromet 1-2 teaspoons every 4-6 hours as needed. For cough, may make you sleepy Followup Dr. Vassie Loll in 2-3 months and as needed. Please contact office for sooner follow up if symptoms do not improve or worsen or seek emergency care

## 2014-08-20 NOTE — Progress Notes (Signed)
   Subjective:    Patient ID: Kelsey [name], female    DOB: 05-30-1981, 33 y.o.   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  She reports mild intermittent symptoms since childhood only requiring albuterol when necessary. She had frequent flares in 2013, stable x 1 yr, & again flares since 06/2013. Singulair was started 04/26/2012 in addition to Advair 250/50 - advair changed to dulera 08/2013 Asthma triggers include heat humidity allergies and cats  Environment - she has lived in the same house since 2009. She has a dog since 2004. She has perennial allergies and took allergy shots until age 1. Her symptoms are not worse at work and she does not describe any triggers at work. She reports anxiety and depression and recent marital stress.  Of note she had a rheumatological evaluation by Dr. Nickola Major, ANA, RA factor was negative sedimentation rate was 3, CPK was 44, thyroid function was normal  Allergy testing - High titers to cat & dog dander, grass & house dust  PFTs in November 2013 showed FEV1 of 1.87-59% improving to 2.24-70% with bronchodilator.    Sep 2014 severe asthma exacerbation requiring hospitalization. Due to her frequent symptoms we decided on an extended pred taper until jan 2015    04/08/14  Uses albuterol ~once x week.  Remains on QVAR and Dulera .  Denies any wheezing, dyspnea, chest tightness, f/c/s, hemoptysis, nausea, vomiting.  Has tapered off prednisone since jan 2015 , no flare in cough or wheezing.  No increased SABA use.  Allergy season has not been bad  08/20/2014 Acute OV  Complains of c/o cough-yellow x 1 wk.,runny [name]-yellow,wheezing .  Using Proventil more often,wheezing,midchest tightness, no fcs.  Was called in prednisone and zpack last week. Does feel some better but cough is lingering with nasal congestion . Still coughing up thick yellow mucus.  Cough is keeping her up at night.   No hemoptysis, fever, chest pain, orthopnea, edema.        Review of Systems  neg for any significant sore throat, dysphagia, itching, sneezing, nasal congestion or excess/ purulent secretions, fever, chills, sweats, unintended wt loss, pleuritic or exertional cp, hempoptysis, orthopnea pnd or change in chronic leg swelling. Also denies presyncope, palpitations, heartburn, abdominal pain, nausea, vomiting, diarrhea or change in bowel or urinary habits, dysuria,hematuria, rash, arthralgias, visual complaints, headache, numbness weakness or ataxia.     Objective:   Physical Exam   Gen. Pleasant, well-nourished, in no distress ENT - no lesions, clear nasal drainage  Neck: No JVD, no thyromegaly, no carotid bruits Lungs: no use of accessory muscles, no dullness to percussion, no wheezing noted.  Cardiovascular: Rhythm regular, heart sounds  normal, no murmurs or gallops, no peripheral edema Musculoskeletal: No deformities, no cyanosis or clubbing        Assessment & Plan:

## 2014-08-20 NOTE — Assessment & Plan Note (Addendum)
Slowly resolving flare with bronchitis   Plan  Biaxin XL pack -take with food.  Eat yogurt daily .  Mucinex DM twice daily as needed. For cough, congestion. Hydromet 1-2 teaspoons every 4-6 hours as needed. For cough, may make you sleepy Followup Dr. Vassie Loll in 2-3 months and as needed. Please contact office for sooner follow up if symptoms do not improve or worsen or seek emergency care

## 2014-09-30 ENCOUNTER — Telehealth: Payer: Self-pay | Admitting: Pulmonary Disease

## 2014-09-30 NOTE — Telephone Encounter (Signed)
Called pt. appt scheduled for he to come in and see TP tomorrow. Nothing further needed

## 2014-10-01 ENCOUNTER — Ambulatory Visit: Payer: 59 | Admitting: Adult Health

## 2014-10-06 ENCOUNTER — Telehealth: Payer: Self-pay | Admitting: Pulmonary Disease

## 2014-10-06 ENCOUNTER — Other Ambulatory Visit: Payer: Self-pay | Admitting: *Deleted

## 2014-10-06 MED ORDER — HYDROCOD POLST-CHLORPHEN POLST 10-8 MG/5ML PO LQCR: 5.0000 mL | Freq: Two times a day (BID) | ORAL | Status: DC | PRN

## 2014-10-06 MED ORDER — PREDNISONE 10 MG PO TABS: 10.0000 mg | ORAL_TABLET | Freq: Every day | ORAL | Status: DC

## 2014-10-06 NOTE — Addendum Note (Signed)
Addended by: Maisie FusGREEN, Day Greb M on: 10/06/2014 11:43 AM   Modules accepted: Orders

## 2014-10-06 NOTE — Telephone Encounter (Signed)
Spoke with pt-- aware that Dr Vassie LollAlva has to sign the Rx for the Tussionex.  [facility]. Pt aware to come pick up Rx for Tussionex this afternoon after 2pm. Rx printed and given to Marcelino DusterMichelle to have Dr Vassie LollAlva sign this afternoon.  Nothing further needed.

## 2014-10-06 NOTE — Telephone Encounter (Signed)
feeling worse with change in weather - green phlegm, nocturnal cough & wheeze  Pl call in to [place] pharmacy Prednisone 10 mg tabs  x40 Take 2 tabs daily with food x 5ds, then 1 tab daily with food x 5ds then STOP Tussionex 5ml PO q hs x 90 cc

## 2014-10-26 ENCOUNTER — Ambulatory Visit: Payer: 59 | Admitting: Pulmonary Disease

## 2014-10-28 ENCOUNTER — Ambulatory Visit (INDEPENDENT_AMBULATORY_CARE_PROVIDER_SITE_OTHER): Payer: 59 | Admitting: Adult Health

## 2014-10-28 ENCOUNTER — Encounter: Payer: Self-pay | Admitting: Adult Health

## 2014-10-28 VITALS — BP 100/82 | HR 94 | Ht 64.0 in | Wt 172.0 lb

## 2014-10-28 DIAGNOSIS — J45901 Unspecified asthma with (acute) exacerbation: Secondary | ICD-10-CM

## 2014-10-28 MED ORDER — PREDNISONE 10 MG PO TABS: ORAL_TABLET | ORAL | Status: DC

## 2014-10-28 MED ORDER — CEFDINIR 300 MG PO CAPS: 300.0000 mg | ORAL_CAPSULE | Freq: Two times a day (BID) | ORAL | Status: DC

## 2014-10-28 MED ORDER — HYDROCODONE-HOMATROPINE 5-1.5 MG/5ML PO SYRP: 5.0000 mL | ORAL_SOLUTION | Freq: Four times a day (QID) | ORAL | Status: DC | PRN

## 2014-10-28 NOTE — Patient Instructions (Signed)
Omnicef 300mg  Twice daily  For 7 days  Eat yogurt daily .  Mucinex DM twice daily as needed. For cough, congestion. Prednisone taper over next week.  Hydromet 1-2 teaspoons every 4-6 hours as needed. For cough, may make you sleepy Followup Dr. Vassie LollAlva in 3 months and as needed. Please contact office for sooner follow up if symptoms do not improve or worsen or seek emergency care

## 2014-10-28 NOTE — Assessment & Plan Note (Signed)
Flare with URI   Plan  Omnicef 300mg  Twice daily  For 7 days  Eat yogurt daily .  Mucinex DM twice daily as needed. For cough, congestion. Prednisone taper over next week.  Hydromet 1-2 teaspoons every 4-6 hours as needed. For cough, may make you sleepy Followup Dr. Vassie LollAlva in 3 months and as needed. Please contact office for sooner follow up if symptoms do not improve or worsen or seek emergency care

## 2014-10-28 NOTE — Progress Notes (Signed)
   Subjective:    Patient ID: Kelsey [name], female    DOB: May 03, 1981, 33 y.o.   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  She reports mild intermittent symptoms since childhood only requiring albuterol when necessary. She had frequent flares in 2013, stable x 1 yr, & again flares since 06/2013. Singulair was started 04/26/2012 in addition to Advair 250/50 - advair changed to dulera 08/2013 Asthma triggers include heat humidity allergies and cats  Environment - she has lived in the same house since 2009. She has a dog since 2004. She has perennial allergies and took allergy shots until age 33. Her symptoms are not worse at work and she does not describe any triggers at work. She reports anxiety and depression and recent marital stress.  Of note she had a rheumatological evaluation by Dr. Nickola MajorHawkes, ANA, RA factor was negative sedimentation rate was 3, CPK was 44, thyroid function was normal  Allergy testing - High titers to cat & dog dander, grass & house dust  PFTs in November 2013 showed FEV1 of 1.87-59% improving to 2.24-70% with bronchodilator.    Sep 2014 severe asthma exacerbation requiring hospitalization. Due to her frequent symptoms we decided on an extended pred taper until jan 2015    04/08/14  Uses albuterol ~once x week.  Remains on QVAR and Dulera .  Denies any wheezing, dyspnea, chest tightness, f/c/s, hemoptysis, nausea, vomiting.  Has tapered off prednisone since jan 2015 , no flare in cough or wheezing.  No increased SABA use.  Allergy season has not been bad  08/20/14 Acute OV  Complains of c/o cough-yellow x 1 wk.,runny nose-yellow,wheezing .  Using Proventil more often,wheezing,midchest tightness, no fcs.  Was called in prednisone and zpack last week. Does feel some better but cough is lingering with nasal congestion . Still coughing up thick yellow mucus.  Cough is keeping her up at night.  No  hemoptysis, fever, chest pain, orthopnea, edema.   >Biaxin XL   10/28/2014 Follow up Asthma  Returns for  2 month rov.   Complains over the last week, that she's had increased productive cough with thick yellow mucus, nasal congestion, drainage, wheezing and shortness of breath. She denies any chest pain, orthopnea, PND or leg swelling. States that her asthma is been under good control up until the last week.    Review of Systems neg for any significant sore throat, dysphagia, itching, sneezing, nasal congestion or excess/ purulent secretions, fever, chills, sweats, unintended wt loss, pleuritic or exertional cp, hempoptysis, orthopnea pnd or change in chronic leg swelling. Also denies presyncope, palpitations, heartburn, abdominal pain, nausea, vomiting, diarrhea or change in bowel or urinary habits, dysuria,hematuria, rash, arthralgias, visual complaints, headache, numbness weakness or ataxia.     Objective:   Physical Exam  Gen. Pleasant, well-nourished, in no distress ENT - no lesions, clear nasal drainage  Neck: No JVD, no thyromegaly, no carotid bruits Lungs: no use of accessory muscles, no dullness to percussion, faint exp wheeze  Cardiovascular: Rhythm regular, heart sounds  normal, no murmurs or gallops, no peripheral edema Musculoskeletal: No deformities, no cyanosis or clubbing        Assessment & Plan:

## 2014-10-29 NOTE — Progress Notes (Signed)
Reviewed & agree with plan  

## 2014-10-30 ENCOUNTER — Encounter: Payer: Self-pay | Admitting: Internal Medicine

## 2014-10-30 ENCOUNTER — Emergency Department (HOSPITAL_COMMUNITY): Payer: 59

## 2014-10-30 ENCOUNTER — Ambulatory Visit (INDEPENDENT_AMBULATORY_CARE_PROVIDER_SITE_OTHER): Payer: 59 | Admitting: Internal Medicine

## 2014-10-30 ENCOUNTER — Encounter (HOSPITAL_COMMUNITY): Payer: Self-pay | Admitting: *Deleted

## 2014-10-30 ENCOUNTER — Emergency Department (HOSPITAL_COMMUNITY)
Admission: EM | Admit: 2014-10-30 | Discharge: 2014-10-31 | Disposition: A | Discharge status: Home / Self Care | Payer: 59 | Attending: Emergency Medicine | Admitting: Emergency Medicine

## 2014-10-30 VITALS — BP 130/90 | HR 110

## 2014-10-30 DIAGNOSIS — F329 Major depressive disorder, single episode, unspecified: Secondary | ICD-10-CM | POA: Insufficient documentation

## 2014-10-30 DIAGNOSIS — Z7952 Long term (current) use of systemic steroids: Secondary | ICD-10-CM | POA: Insufficient documentation

## 2014-10-30 DIAGNOSIS — J45901 Unspecified asthma with (acute) exacerbation: Secondary | ICD-10-CM | POA: Insufficient documentation

## 2014-10-30 DIAGNOSIS — R0602 Shortness of breath: Secondary | ICD-10-CM | POA: Diagnosis present

## 2014-10-30 DIAGNOSIS — Z8679 Personal history of other diseases of the circulatory system: Secondary | ICD-10-CM | POA: Insufficient documentation

## 2014-10-30 DIAGNOSIS — Z880 Allergy status to penicillin: Secondary | ICD-10-CM | POA: Diagnosis not present

## 2014-10-30 DIAGNOSIS — K219 Gastro-esophageal reflux disease without esophagitis: Secondary | ICD-10-CM | POA: Diagnosis not present

## 2014-10-30 DIAGNOSIS — Z7951 Long term (current) use of inhaled steroids: Secondary | ICD-10-CM | POA: Insufficient documentation

## 2014-10-30 DIAGNOSIS — Z79899 Other long term (current) drug therapy: Secondary | ICD-10-CM | POA: Diagnosis not present

## 2014-10-30 LAB — CBC WITH DIFFERENTIAL/PLATELET
BASOS ABS: 0 10*3/uL (ref 0.0–0.1)
BASOS PCT: 0 % (ref 0–1)
EOS PCT: 0 % (ref 0–5)
Eosinophils Absolute: 0 10*3/uL (ref 0.0–0.7)
HCT: 36.5 % (ref 36.0–46.0)
Hemoglobin: 12.3 g/dL (ref 12.0–15.0)
LYMPHS ABS: 0.4 10*3/uL — AB (ref 0.7–4.0)
Lymphocytes Relative: 6 % — ABNORMAL LOW (ref 12–46)
MCH: 31.1 pg (ref 26.0–34.0)
MCHC: 33.7 g/dL (ref 30.0–36.0)
MCV: 92.2 fL (ref 78.0–100.0)
Monocytes Absolute: 0.1 10*3/uL (ref 0.1–1.0)
Monocytes Relative: 1 % — ABNORMAL LOW (ref 3–12)
NEUTROS PCT: 93 % — AB (ref 43–77)
Neutro Abs: 6.3 10*3/uL (ref 1.7–7.7)
Platelets: 278 10*3/uL (ref 150–400)
RBC: 3.96 MIL/uL (ref 3.87–5.11)
RDW: 12.5 % (ref 11.5–15.5)
WBC: 6.8 10*3/uL (ref 4.0–10.5)

## 2014-10-30 LAB — BASIC METABOLIC PANEL
Anion gap: 14 (ref 5–15)
BUN: 9 mg/dL (ref 6–23)
CALCIUM: 9 mg/dL (ref 8.4–10.5)
CO2: 23 mEq/L (ref 19–32)
Chloride: 104 mEq/L (ref 96–112)
Creatinine, Ser: 0.67 mg/dL (ref 0.50–1.10)
GLUCOSE: 191 mg/dL — AB (ref 70–99)
Potassium: 3 mEq/L — ABNORMAL LOW (ref 3.7–5.3)
Sodium: 141 mEq/L (ref 137–147)

## 2014-10-30 MED ORDER — LEVALBUTEROL HCL 0.63 MG/3ML IN NEBU
0.6300 mg | INHALATION_SOLUTION | Freq: Once | RESPIRATORY_TRACT | Status: AC
  Administered 2014-10-30: 0.63 mg via RESPIRATORY_TRACT

## 2014-10-30 MED ORDER — IPRATROPIUM-ALBUTEROL 0.5-2.5 (3) MG/3ML IN SOLN
3.0000 mL | RESPIRATORY_TRACT | Status: DC
  Administered 2014-10-30: 3 mL via RESPIRATORY_TRACT

## 2014-10-30 MED ORDER — METHYLPREDNISOLONE SODIUM SUCC 125 MG IJ SOLR: 125.0000 mg | Freq: Once | INTRAMUSCULAR | Status: DC

## 2014-10-30 NOTE — Progress Notes (Signed)
Subjective:    Patient ID: Kelsey [name], female    DOB: October 10, 1981, 33 y.o.   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  She reports mild intermittent symptoms since childhood only requiring albuterol when necessary. She had frequent flares in 2013, stable x 1 yr, & again flares since 06/2013. Singulair was started 04/26/2012 in addition to Advair 250/50 - advair changed to dulera 08/2013 Asthma triggers include heat humidity allergies and cats  Environment - she has lived in the same house since 2009. She has a dog since 2004. She has perennial allergies and took allergy shots until age 33. Her symptoms are not worse at work and she does not describe any triggers at work. She reports anxiety and depression and recent marital stress.  Of note she had a rheumatological evaluation by Dr. Nickola MajorHawkes, ANA, RA factor was negative sedimentation rate was 3, CPK was 44, thyroid function was normal  Allergy testing - High titers to cat & dog dander, grass & house dust  PFTs in November 2013 showed FEV1 of 1.87-59% improving to 2.24-70% with bronchodilator.    Sep 2014 severe asthma exacerbation requiring hospitalization. Due to her frequent symptoms we decided on an extended pred taper until jan 2015    04/08/14  Uses albuterol ~once x week.  Remains on QVAR and Dulera .  Denies any wheezing, dyspnea, chest tightness, f/c/s, hemoptysis, nausea, vomiting.  Has tapered off prednisone since jan 2015 , no flare in cough or wheezing.  No increased SABA use.  Allergy season has not been bad  08/20/14 Acute OV  Complains of c/o cough-yellow x 1 wk.,runny nose-yellow,wheezing .  Using Proventil more often,wheezing,midchest tightness, no fcs.  Was called in prednisone and zpack last week. Does feel some better but cough is lingering with nasal congestion . Still coughing up thick yellow mucus.  Cough is keeping her up at night.  No  hemoptysis, fever, chest pain, orthopnea, edema.   >Biaxin XL   10/28/2014 Follow up Asthma  Returns for  2 month rov.   Complains over the last week, that she's had increased productive cough with thick yellow mucus, nasal congestion, drainage, wheezing and shortness of breath. She denies any chest pain, orthopnea, PND or leg swelling. States that her asthma is been under good control up until the last week. rec Omnicef 300mg  Twice daily  For 7 days  Eat yogurt daily .  Mucinex DM twice daily as needed. For cough, congestion. Prednisone taper over next week.  Hydromet 1-2 teaspoons every 4-6 hours as needed. For cough, may make you sleepy Followup Dr. Vassie LollAlva in 3 months and as needed.   10/30/2014 acute ov/Wert re: RT with ? asthma flare, doesn't know her meds  Chief Complaint  Patient presents with  . Acute Visit    Pt c/o increased SOB, wheezing and prod cough with yellow sputum for the past 2 days. She has used albuterol neb tx and albuterol inhaler today with no relief.   maint rx dulera one twice daily with albuterol maybe once a week and singulair Then "caught a cold"   X one week prior to OV  And did not increase dulera doesn't know what strengths she takes  No better since ov 12/2 and now sob at rest even p home neb which she last used 1 h prior to OV   Also on xanax up to 1 mg tid per  Dr Westley ChandlerKarr   No obvious day to day or daytime variabilty or assoc  chest tightness, subjective wheeze overt sinus or hb symptoms. No unusual exp hx or h/o childhood pna/ asthma or knowledge of premature birth.  Sleeping ok without nocturnal  or early am exacerbation  of respiratory  c/o's or need for noct saba. Also denies any obvious fluctuation of symptoms with weather or environmental changes or other aggravating or alleviating factors except as outlined above   Current Medications, Allergies, Complete Past Medical History, Past Surgical History, Family History, and Social History were reviewed in  [facility] electronic [facility] record.  ROS  The following are not active complaints unless bolded sore throat, dysphagia, dental problems, itching, sneezing,  nasal congestion or excess/ purulent secretions, ear ache,   fever, chills, sweats, unintended wt loss, clasically pleuritic or exertional cp, hemoptysis,  orthopnea pnd or leg swelling, presyncope, palpitations, heartburn, abdominal pain, anorexia, nausea, vomiting, diarrhea  or change in bowel or urinary habits, change in stools or urine, dysuria,hematuria,  rash, arthralgias, visual complaints, headache, numbness weakness or ataxia or problems with walking or coordination,  change in mood/affect or memory.               Objective:   Physical Exam  Anxious wf not consistent with hx like " when were you last well"  A 3-4 months - "when did you get worse"   A  2 days ago  Wt Readings from Last 3 Encounters:  10/28/14 172 lb (78.019 kg)  08/20/14 171 lb (77.565 kg)  08/04/14 172 lb (78.019 kg)    Vital signs reviewed  HEENT: nl dentition, turbinates, and orophanx. Nl external ear canals without cough reflex   NECK :  without JVD/Nodes/TM/ nl carotid upstrokes bilaterally   LUNGS: no acc muscle use, clear to A and P bilaterally without cough on insp or exp maneuvers/ minimal pseudowheeze only on fvc   CV:  RRR  no s3 or murmur or increase in P2, no edema   ABD:  soft and nontender with nl excursion in the supine position. No bruits or organomegaly, bowel sounds nl  MS:  warm without deformities, calf tenderness, cyanosis or clubbing  SKIN: warm and dry without lesions    NEURO:  alert, approp, no deficits    Could not do spirometry "feel like I'm going to pass out"         Assessment & Plan:

## 2014-10-30 NOTE — ED Notes (Signed)
Bed: WA02 Expected date:  Expected time:  Means of arrival:  Comments: EMS-SOB 

## 2014-10-30 NOTE — ED Notes (Signed)
Per EMS, pt has had cough, shortness of breath, congestion for the past 2 days. Pt was seen by PCP 2 days ago, put on prednisone. Pt had asthma attack today, went to PCP, who advised pt go to ED due to pt possibly having an anxiety attack. Pt used dulera inhaler at PCP office. EMS gave pt duoneb treatment, 125mg  solumedrol IV.

## 2014-10-30 NOTE — ED Notes (Addendum)
Patient ambulated with a pulse of 118 and spO2 96%. Patient reported chest pain when ambulating. Respiratory therapist at bedside with breathing treatment. RN notified.

## 2014-10-30 NOTE — ED Notes (Signed)
Pt states she is still having L sided chest wall pain which is worse with inspiration and breathing. MD aware.

## 2014-10-30 NOTE — ED Notes (Signed)
RT called for duo neb tx 

## 2014-10-30 NOTE — ED Provider Notes (Addendum)
CSN: [id number]     Arrival date & time 10/30/14  1733 History   First MD Initiated Contact with Patient 10/30/14 1739     Chief Complaint  Patient presents with  . Shortness of Breath     (Consider location/radiation/quality/duration/timing/severity/associated sxs/prior Treatment) HPI The patient portion several day she has had increasing cough. She reports she has not had a fever with this but she has been coughing up yellow sputum. She reports she has asthma and she has been feeling short of breath. She reports that she's felt like she's had a tight sensation in the center of her chest. She was started on prednisone 2 days ago and has had 40 mg each day for 2 days she reports she was also started on a Z-Pak. She worse today the symptoms got worse and she reports she has had to be hospitalized in the past for her asthma. She reports that she took a DuoNeb at home before going to her doctor in then she got 2 Xopenex treatments and her doctors house. She reports she was transferred to the hospital and got another DuoNeb treatment and Solu-Medrol on route. She reports it does feel better now although she still feels like she is working a little harder to breathe. Past Medical History  Diagnosis Date  . PVC (premature ventricular contraction)   . Palpitations   . Pre-syncope   . Tachycardia   . Depression   . Insomnia   . Seasonal allergies   . Asthma   . Anxiety   . Depression   . Insomnia   . GERD (gastroesophageal reflux disease)    Past Surgical History  Procedure Laterality Date  . Cesarean section  10/15/2009, 02/08/2006    times 2   Family History  Problem Relation Age of Onset  . Heart disease Father   . Rheumatologic disease Mother   . Lung cancer Paternal Grandmother    History  Substance Use Topics  . Smoking status: Never Smoker   . Smokeless tobacco: Never Used     Comment: reports "tried it as a teenager" but has not smoked since  . Alcohol Use: No   OB  History    No data available     Review of Systems 10 Systems reviewed and are negative for acute change except as noted in the HPI.    Allergies  Penicillins  Home Medications   Prior to Admission medications   Medication Sig Start Date End Date Taking? Authorizing Provider  albuterol (PROVENTIL HFA;VENTOLIN HFA) 108 (90 BASE) MCG/ACT inhaler Inhale 2 puffs into the lungs every 4 (four) hours as needed (For shortness of breath). 03/11/14  Yes Oretha Milchakesh Alva V, MD  albuterol (PROVENTIL) (5 MG/ML) 0.5% nebulizer solution Take 5 mg by nebulization every 6 (six) hours as needed. For wheeze and shortness of breath   Yes Historical Provider, MD  ALPRAZolam (XANAX) 0.5 MG tablet Take 1 mg by mouth at bedtime as needed. For anxiety   Yes Historical Provider, MD  beclomethasone (QVAR) 80 MCG/ACT inhaler Inhale 1 puff into the lungs 2 (two) times daily. 03/11/14  Yes Oretha Milchakesh Alva V, MD  cefdinir (OMNICEF) 300 MG capsule Take 1 capsule (300 mg total) by mouth 2 (two) times daily. Patient taking differently: Take 300 mg by mouth 2 (two) times daily. For 7 days 10/28/14  Yes Tammy S Parrett, NP  cetirizine (ZYRTEC) 10 MG tablet Take 10 mg by mouth daily.   Yes Historical Provider, MD  desvenlafaxine (PRISTIQ) 50  MG 24 hr tablet Take 50 mg by mouth daily.   Yes Historical Provider, MD  esomeprazole (NEXIUM) 40 MG capsule Take 40 mg by mouth daily at 12 noon.   Yes Historical Provider, MD  HYDROcodone-homatropine (HYDROMET) 5-1.5 MG/5ML syrup Take 5 mLs by mouth every 6 (six) hours as needed. 10/28/14  Yes Tammy S Parrett, NP  levonorgestrel (MIRENA) 20 MCG/24HR IUD 1 each by Intrauterine route once.   Yes Historical Provider, MD  mirtazapine (REMERON) 15 MG tablet Take 45 mg by mouth at bedtime.    Yes Historical Provider, MD  mometasone-formoterol (DULERA) 200-5 MCG/ACT AERO Inhale 2 puffs into the lungs 2 (two) times daily. 03/11/14  Yes Oretha Milchakesh Alva V, MD  montelukast (SINGULAIR) 10 MG tablet Take 1 tablet  (10 mg total) by mouth at bedtime. 09/29/13  Yes Oretha Milchakesh Alva V, MD  predniSONE (DELTASONE) 10 MG tablet 4 tabs for 2 days, then 3 tabs for 2 days, 2 tabs for 2 days, then 1 tab for 2 days, then stop 10/28/14  Yes Tammy S Parrett, NP  chlorpheniramine-HYDROcodone (TUSSIONEX PENNKINETIC ER) 10-8 MG/5ML LQCR Take 5 mLs by mouth every 12 (twelve) hours as needed for cough. Patient not taking: Reported on 10/30/2014 10/06/14   Oretha Milchakesh Alva V, MD  clarithromycin (BIAXIN XL) 500 MG 24 hr tablet Take 2 tablets (1,000 mg total) by mouth daily. Patient not taking: Reported on 10/30/2014 08/20/14   Virgel Bouquetammy S Parrett, NP  predniSONE (DELTASONE) 10 MG tablet Take 1 tablet (10 mg total) by mouth daily with breakfast. 10/06/14   Oretha Milchakesh Alva V, MD   BP 116/60 mmHg  Pulse 101  Temp(Src) 97.6 F (36.4 C) (Oral)  Resp 15  SpO2 95% Physical Exam  Constitutional: She is oriented to person, place, and time. She appears well-developed and well-nourished.  Patient's mental status and voice is clear. She has mild increased work of breathing.  HENT:  Head: Normocephalic and atraumatic.  Nose: Nose normal.  Mouth/Throat: Oropharynx is clear and moist. No oropharyngeal exudate.  Eyes: EOM are normal. Pupils are equal, round, and reactive to light.  Neck: Neck supple.  Cardiovascular: Regular rhythm, normal heart sounds and intact distal pulses.  Exam reveals no gallop.   No murmur heard. Patient is mildly tachycardic.  Pulmonary/Chest: She has wheezes (patient has expiratory wheeze bilaterally throughout lung fields however good flow to the bases.). She has no rales. She exhibits no tenderness.  Patient has mild increased work of breathing however she is speaking in full sentences.  Abdominal: Soft. Bowel sounds are normal. She exhibits no distension. There is no tenderness.  Musculoskeletal: Normal range of motion. She exhibits no edema or tenderness.  Neurological: She is alert and oriented to person, place, and time.  No cranial nerve deficit. Coordination normal.  Skin: Skin is warm and dry.  Psychiatric: She has a normal mood and affect.    ED Course  Procedures (including critical care time) Labs Review Labs Reviewed  BASIC METABOLIC PANEL - Abnormal; Notable for the following:    Potassium 3.0 (*)    Glucose, Bld 191 (*)    All other components within normal limits  CBC WITH DIFFERENTIAL - Abnormal; Notable for the following:    Neutrophils Relative % 93 (*)    Lymphocytes Relative 6 (*)    Lymphs Abs 0.4 (*)    Monocytes Relative 1 (*)    All other components within normal limits    Imaging Review Dg Chest 2 View  10/30/2014  CLINICAL DATA:  33 year old female with 3 day history of shortness of breath and productive cough. Wheezing, particularly with exertion. One day history of chest pain. History of asthma.  EXAM: CHEST  2 VIEW  COMPARISON:  Chest x-ray 09/15/2013.  FINDINGS: Unusual contour of the right heart border, similar to prior examinations, presumably secondary to underlying pectus excavatum. No confluent consolidative airspace disease. No pleural effusions. No evidence of pulmonary edema. Heart size is normal. Upper mediastinal contours are within normal limits.  IMPRESSION: 1. No radiographic evidence of acute cardiopulmonary disease. 2. Pectus excavatum.   Electronically Signed   By: Trudie Reed M.D.   On: 10/30/2014 19:08     EKG Interpretation None     21:38 recheck. Air flow much improved. With auscultation lungs now clear and wheeze resolved. The patient does feel that shortness of breath and tightness in the chest has improved. MDM   Final diagnoses:  Asthma exacerbation   The patient has asthma exacerbation. Symptoms started with respiratory congestion and cough. At this point however there is no apparent pneumonia on chest x-ray. Patient has initiated treatment as an outpatient by her family physician. She is not hypoxic nor in significant respiratory distress.  The patient had wheezing that completely resolved after albuterol therapy. At this point I do feel she is safe for continued home management. She is aware of the need to return should her symptoms rebound or worsen.    Arby Barrette, MD 10/30/14 2329  Plan was for discharge. However nursing reports prior to discharging the patient reports that she is having pain in her left chest now that is sharp in nature and worse with breathing. The patient will be evaluated with CTA for rule out of pulmonary embolus.  Arby Barrette, MD 11/02/14 [phone]

## 2014-10-30 NOTE — ED Notes (Addendum)
Patient transported to x-ray. ?

## 2014-10-30 NOTE — Discharge Instructions (Signed)

## 2014-10-31 ENCOUNTER — Emergency Department (HOSPITAL_COMMUNITY): Payer: 59

## 2014-10-31 MED ORDER — IOHEXOL 350 MG/ML SOLN
100.0000 mL | Freq: Once | INTRAVENOUS | Status: AC | PRN
  Administered 2014-10-31: 100 mL via INTRAVENOUS

## 2014-10-31 NOTE — ED Provider Notes (Signed)
Patient signed out to me by Dr. Timmothy EulerMacy Pfeiffer, MD   Patient is a 33 year old female past medical history of asthma who presents the ER complaining of asthma exacerbation and shortness of breath, also complaining of tightness in the center of her chest. Patient's symptoms worsened today after taking prednisone for 2 days and started on azithromycin. Workup today for asthma exacerbation, however patient not in any significant respiratory distress. Patient's wheezing completely resolved after albuterol therapy. Patient later mild chest discomfort, plan to discharge patient home if CT angiogram of chest negative for pulmonary embolism.  PE: Constitutional: well-developed, well-nourished, no apparent distress HENT: normocephalic, atraumatic Cardiovascular: normal rate and rhythm, distal pulses intact Pulmonary/Chest: effort normal; breath sounds clear and equal bilaterally; no wheezes or rales Abdominal: soft and nontender Musculoskeletal: full ROM, no edema Lymphadenopathy: no cervical adenopathy Neurological: alert with goal directed thinking Skin: warm and dry, no rash, no diaphoresis Psychiatric: normal mood and affect, normal behavior    CT angiogram of chest with impression: 1. No evidence of pulmonary embolus. 2. Minimal bibasilar atelectasis noted; lungs otherwise clear.  Patient with heart rate in the low 90s on reexam. Patient not hypoxic with oxygen at 100% on room air. Patient well-appearing, sleeping in the bed, and in no acute distress. Based on results of CT NGO, patient will be discharged.  Return precautions discussed with patient I encouraged patient to call or return to the ER should her symptoms persist, worsen or should she have any questions or concerns.  BP 119/71 mmHg  Pulse 89  Temp(Src) 98.3 F (36.8 C) (Oral)  Resp 18  SpO2 95%  Signed,  Ladona MowJoe Shailene Demonbreun, PA-C 2:04 AM   Monte FantasiaJoseph W Zacharias Ridling, PA-C 10/31/14 [phone]  Ward GivensIva L Knapp, MD 10/31/14 [phone]

## 2014-10-31 NOTE — ED Notes (Signed)
Patient transported to CT 

## 2014-10-31 NOTE — Patient Instructions (Signed)
Transferred to er for persistent sob at rest

## 2014-10-31 NOTE — Assessment & Plan Note (Signed)
Symptoms are markedly disproportionate to objective findings and not clear this is a lung problem but pt does appear to have difficult airway management issues. DDX of  difficult airways management all start with A and  include Adherence, Ace Inhibitors, Acid Reflux, Active Sinus Disease, Alpha 1 Antitripsin deficiency, Anxiety masquerading as Airways dz,  ABPA,  allergy(esp in young), Aspiration (esp in elderly), Adverse effects of DPI,  Active smokers, plus two Bs  = Bronchiectasis and Beta blocker use..and one C= CHF  Adherence is always the initial "prime suspect" and is a multilayered concern that requires a "trust but verify" approach in every patient - starting with knowing how to use medications, especially inhalers, correctly, keeping up with refills and understanding the fundamental difference between maintenance and prns vs those medications only taken for a very short course and then stopped and not refilled.  - she showed very little insight into how to use her dulera vs qvar and did not repeat the instructions last given to her by Dr Vassie LollAlva  - The proper method of use, as well as anticipated side effects, of a metered-dose inhaler are discussed and demonstrated to the patient. Improved effectiveness after extensive coaching during this visit to a level of approximately  90%  ? 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   Could not control her tachypnea in office with sats 99% and developing tingling/ presyncope after neb > called for transport to ER

## 2014-11-30 ENCOUNTER — Emergency Department (HOSPITAL_COMMUNITY)
Admission: EM | Admit: 2014-11-30 | Discharge: 2014-11-30 | Disposition: A | Discharge status: Home / Self Care | Payer: 59 | Source: Home / Self Care | Attending: Family Medicine | Admitting: Family Medicine

## 2014-11-30 ENCOUNTER — Encounter (HOSPITAL_COMMUNITY): Payer: Self-pay | Admitting: Emergency Medicine

## 2014-11-30 ENCOUNTER — Emergency Department (INDEPENDENT_AMBULATORY_CARE_PROVIDER_SITE_OTHER): Payer: 59

## 2014-11-30 DIAGNOSIS — M79671 Pain in right foot: Secondary | ICD-10-CM

## 2014-11-30 MED ORDER — MELOXICAM 7.5 MG PO TABS
7.5000 mg | ORAL_TABLET | Freq: Two times a day (BID) | ORAL | Status: DC
Start: 2014-11-30 — End: 2015-02-04

## 2014-11-30 NOTE — ED Notes (Signed)
Patient given small femal post op shoe with wooden sole. Stood up and walked approx. 3-4 steps. Tolerated well. Left room for ace bandage for patients choice of comfort/compression.

## 2014-11-30 NOTE — ED Provider Notes (Signed)
CSN: [id number]     Arrival date & time 11/30/14  1941 History   First MD Initiated Contact with Patient 11/30/14 1946     Chief Complaint  Patient presents with  . Foot Pain   (Consider location/radiation/quality/duration/timing/severity/associated sxs/prior Treatment) Patient is a 34 y.o. female presenting with lower extremity pain. The history is provided by the patient.  Foot Pain This is a new problem. The current episode started more than 1 week ago. The problem has been gradually worsening (worse today with running to code at hosp.).    Past Medical History  Diagnosis Date  . PVC (premature ventricular contraction)   . Palpitations   . Pre-syncope   . Tachycardia   . Depression   . Insomnia   . Seasonal allergies   . Asthma   . Anxiety   . Depression   . Insomnia   . GERD (gastroesophageal reflux disease)    Past Surgical History  Procedure Laterality Date  . Cesarean section  10/15/2009, 02/08/2006    times 2   Family History  Problem Relation Age of Onset  . Heart disease Father   . Rheumatologic disease Mother   . Lung cancer Paternal Grandmother    History  Substance Use Topics  . Smoking status: Never Smoker   . Smokeless tobacco: Never Used     Comment: reports "tried it as a teenager" but has not smoked since  . Alcohol Use: No   OB History    No data available     Review of Systems  Constitutional: Negative.   Musculoskeletal: Positive for gait problem. Negative for joint swelling.  Skin: Negative.     Allergies  Penicillins  Home Medications   Prior to Admission medications   Medication Sig Start Date End Date Taking? Authorizing Provider  albuterol (PROVENTIL HFA;VENTOLIN HFA) 108 (90 BASE) MCG/ACT inhaler Inhale 2 puffs into the lungs every 4 (four) hours as needed (For shortness of breath). 03/11/14   Oretha Milch, MD  albuterol (PROVENTIL) (5 MG/ML) 0.5% nebulizer solution Take 5 mg by nebulization every 6 (six) hours as needed. For  wheeze and shortness of breath    Historical Provider, MD  ALPRAZolam Prudy Feeler) 0.5 MG tablet Take 1 mg by mouth at bedtime as needed. For anxiety    Historical Provider, MD  beclomethasone (QVAR) 80 MCG/ACT inhaler Inhale 1 puff into the lungs 2 (two) times daily. 03/11/14   Oretha Milch, MD  cefdinir (OMNICEF) 300 MG capsule Take 1 capsule (300 mg total) by mouth 2 (two) times daily. Patient taking differently: Take 300 mg by mouth 2 (two) times daily. For 7 days 10/28/14   Julio Sicks, NP  cetirizine (ZYRTEC) 10 MG tablet Take 10 mg by mouth daily.    Historical Provider, MD  chlorpheniramine-HYDROcodone (TUSSIONEX PENNKINETIC ER) 10-8 MG/5ML LQCR Take 5 mLs by mouth every 12 (twelve) hours as needed for cough. Patient not taking: Reported on 10/30/2014 10/06/14   Oretha Milch, MD  clarithromycin (BIAXIN XL) 500 MG 24 hr tablet Take 2 tablets (1,000 mg total) by mouth daily. Patient not taking: Reported on 10/30/2014 08/20/14   Tammy S Parrett, NP  desvenlafaxine (PRISTIQ) 50 MG 24 hr tablet Take 50 mg by mouth daily.    Historical Provider, MD  esomeprazole (NEXIUM) 40 MG capsule Take 40 mg by mouth daily at 12 noon.    Historical Provider, MD  HYDROcodone-homatropine (HYDROMET) 5-1.5 MG/5ML syrup Take 5 mLs by mouth every 6 (six) hours as  needed. 10/28/14   Tammy S Parrett, NP  levonorgestrel (MIRENA) 20 MCG/24HR IUD 1 each by Intrauterine route once.    Historical Provider, MD  meloxicam (MOBIC) 7.5 MG tablet Take 1 tablet (7.5 mg total) by mouth 2 (two) times daily after a meal. 11/30/14   Linna Hoff, MD  mirtazapine (REMERON) 15 MG tablet Take 45 mg by mouth at bedtime.     Historical Provider, MD  mometasone-formoterol (DULERA) 200-5 MCG/ACT AERO Inhale 2 puffs into the lungs 2 (two) times daily. 03/11/14   Oretha Milch, MD  montelukast (SINGULAIR) 10 MG tablet Take 1 tablet (10 mg total) by mouth at bedtime. 09/29/13   Oretha Milch, MD  predniSONE (DELTASONE) 10 MG tablet Take 1 tablet (10  mg total) by mouth daily with breakfast. 10/06/14   Oretha Milch, MD  predniSONE (DELTASONE) 10 MG tablet 4 tabs for 2 days, then 3 tabs for 2 days, 2 tabs for 2 days, then 1 tab for 2 days, then stop 10/28/14   Tammy S Parrett, NP   BP 132/82 mmHg  Pulse 85  Temp(Src) 97 F (36.1 C) (Oral)  Resp 18  SpO2 97% Physical Exam  Constitutional: She is oriented to person, place, and time. She appears well-developed and well-nourished.  Musculoskeletal: She exhibits tenderness.       Feet:  Neurological: She is alert and oriented to person, place, and time.  Skin: Skin is warm and dry.  Nursing note and vitals reviewed.   ED Course  Procedures (including critical care time) Labs Review Labs Reviewed - No data to display  Imaging Review Dg Foot Complete Right  11/30/2014   CLINICAL DATA:  Acute onset of right foot pain for 1 week, with soft tissue swelling and bruising at the lateral right foot. Initial encounter.  EXAM: RIGHT FOOT COMPLETE - 3+ VIEW  COMPARISON:  None.  FINDINGS: There is no evidence of fracture or dislocation. The joint spaces are preserved. There is no evidence of talar subluxation; the subtalar joint is unremarkable in appearance.  Mild soft tissue swelling is suggested at the lateral aspect of the midfoot and forefoot.  IMPRESSION: No evidence of fracture or dislocation.   Electronically Signed   By: Roanna Raider M.D.   On: 11/30/2014 20:31    X-rays reviewed and report per radiologist.  MDM   1. Acute foot pain, right        Linna Hoff, MD 12/01/14 1017

## 2014-11-30 NOTE — Discharge Instructions (Signed)
Ice twice a day , use medicine and wear shoe for comfort as needed, activity as tolerated, see dr Katrinka Blazing if further problems.

## 2014-11-30 NOTE — ED Notes (Signed)
Patient c/o right dorsal foot pain x 1 week.  Denies toe pain. Patient reports she was running today and felt a pop. Patient is unsure of any injury. Reports she did slip last week but had no immediate pain. Patient has had foot wrapped in ace bandage and taped whichh gave temporary relief. Patient is alert and oriented and in NAD.

## 2014-12-21 ENCOUNTER — Telehealth: Payer: Self-pay | Admitting: Pulmonary Disease

## 2014-12-21 MED ORDER — AZITHROMYCIN 250 MG PO TABS: ORAL_TABLET | ORAL | Status: DC

## 2014-12-21 NOTE — Telephone Encounter (Signed)
I just worked with her on Friday ! Zpak ok

## 2014-12-21 NOTE — Telephone Encounter (Signed)
Zpak, Patient notified.  No questions or concerns at this time. Nothing further needed.

## 2014-12-21 NOTE — Telephone Encounter (Signed)
Spoke with pt. Reports cough with production of yellow mucus. SOB, chest congestion and wheezing at night are also present. Denies fever. Would like something to be called in.  Allergies  Allergen Reactions  . Penicillins Rash   RA - please advise. Thanks.

## 2015-01-08 ENCOUNTER — Ambulatory Visit (INDEPENDENT_AMBULATORY_CARE_PROVIDER_SITE_OTHER): Payer: 59 | Admitting: Adult Health

## 2015-01-08 ENCOUNTER — Encounter: Payer: Self-pay | Admitting: Adult Health

## 2015-01-08 VITALS — BP 122/84 | HR 83 | Temp 97.9°F | Ht 64.0 in | Wt 174.4 lb

## 2015-01-08 DIAGNOSIS — J45901 Unspecified asthma with (acute) exacerbation: Secondary | ICD-10-CM

## 2015-01-08 MED ORDER — MOMETASONE FURO-FORMOTEROL FUM 200-5 MCG/ACT IN AERO: 2.0000 | INHALATION_SPRAY | Freq: Two times a day (BID) | RESPIRATORY_TRACT | Status: DC

## 2015-01-08 MED ORDER — PREDNISONE 10 MG PO TABS: ORAL_TABLET | ORAL | Status: DC

## 2015-01-08 MED ORDER — LEVOFLOXACIN 500 MG PO TABS: 500.0000 mg | ORAL_TABLET | Freq: Every day | ORAL | Status: AC

## 2015-01-08 NOTE — Progress Notes (Signed)
Subjective:    Patient ID: Kelsey NoseJennifer L [name], female    DOB: [birth year], 34 y.o.   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  She reports mild intermittent symptoms since childhood only requiring albuterol when necessary. She had frequent flares in 2013, stable x 1 yr, & again flares since 06/2013. Singulair was started 04/26/2012 in addition to Advair 250/50 - advair changed to dulera 08/2013 Asthma triggers include heat humidity allergies and cats  Environment - she has lived in the same house since 2009. She has a dog since 2004. She has perennial allergies and took allergy shots until age 34. Her symptoms are not worse at work and she does not describe any triggers at work. She reports anxiety and depression and recent marital stress.  Of note she had a rheumatological evaluation by Dr. Nickola MajorHawkes, ANA, RA factor was negative sedimentation rate was 3, CPK was 44, thyroid function was normal  Allergy testing - High titers to cat & dog dander, grass & house dust  PFTs in November 2013 showed FEV1 of 1.87-59% improving to 2.24-70% with bronchodilator.    Sep 2014 severe asthma exacerbation requiring hospitalization. Due to her frequent symptoms we decided on an extended pred taper until jan 2015    04/08/14  Uses albuterol ~once x week.  Remains on QVAR and Dulera .  Denies any wheezing, dyspnea, chest tightness, f/c/s, hemoptysis, nausea, vomiting.  Has tapered off prednisone since jan 2015 , no flare in cough or wheezing.  No increased SABA use.  Allergy season has not been bad  08/20/14 Acute OV  Complains of c/o cough-yellow x 1 wk.,runny nose-yellow,wheezing .  Using Proventil more often,wheezing,midchest tightness, no fcs.  Was called in prednisone and zpack last week. Does feel some better but cough is lingering with nasal congestion . Still coughing up thick yellow mucus.  Cough is keeping her up at night.  No  hemoptysis, fever, chest pain, orthopnea, edema.   >Biaxin XL   10/28/2014 Follow up Asthma  Returns for  2 month rov.   Complains over the last week, that she's had increased productive cough with thick yellow mucus, nasal congestion, drainage, wheezing and shortness of breath. She denies any chest pain, orthopnea, PND or leg swelling. States that her asthma is been under good control up until the last week. rec Omnicef 300mg  Twice daily  For 7 days  Eat yogurt daily .  Mucinex DM twice daily as needed. For cough, congestion. Prednisone taper over next week.  Hydromet 1-2 teaspoons every 4-6 hours as needed. For cough, may make you sleepy Followup Dr. Vassie LollAlva in 3 months and as needed.   10/30/2014 acute ov/Wert re: RT with ? asthma flare, doesn't know her meds  Chief Complaint  Patient presents with  . Acute Visit    Pt c/o increased SOB, wheezing and prod cough with yellow sputum for the past 2 days. She has used albuterol neb tx and albuterol inhaler today with no relief.   maint rx dulera one twice daily with albuterol maybe once a week and singulair Then "caught a cold"   X one week prior to OV  And did not increase dulera doesn't know what strengths she takes  No better since ov 12/2 and now sob at rest even p home neb which she last used 1 h prior to OV   Also on xanax up to 1 mg tid per  Dr Westley Chandler  >>ER   01/08/2015 Acute OV  Patient presents for an acute office visit She complains of 2 weeks of cough, congestion, nasal drainage, wheezing and malaise. She was initially called in a Z-Pak but did not see any improvement from this. She continues to have cough, congestion, and intermittent wheezing. Remains on Dulera Qvar, Singulair. Previous allergy testing 2 years ago showed a high IgE and positive allergy profile.   Current Medications, Allergies, Complete Past Medical History, Past Surgical History, Family History, and Social History were reviewed in [employer] record.  ROS  The following are not active complaints unless bolded sore throat, dysphagia, dental problems, itching, sneezing,  nasal congestion or excess/ purulent secretions, ear ache,   fever, chills, sweats, unintended wt loss, clasically pleuritic or exertional cp, hemoptysis,  orthopnea pnd or leg swelling, presyncope, palpitations, heartburn, abdominal pain, anorexia, nausea, vomiting, diarrhea  or change in bowel or urinary habits, change in stools or urine, dysuria,hematuria,  rash, arthralgias, visual complaints, headache, numbness weakness or ataxia or problems with walking or coordination,  change in mood/affect or memory.               Objective:   Physical Exam  Vital signs reviewed  HEENT: nl dentition, turbinates, and orophanx. Nl external ear canals without cough reflex   NECK :  without JVD/Nodes/TM/ nl carotid upstrokes bilaterally   LUNGS: Faint tr exp wheezes   CV:  RRR  no s3 or murmur or increase in P2, no edema   ABD:  soft and nontender with nl excursion in the supine position. No bruits or organomegaly, bowel sounds nl  MS:  warm without deformities, calf tenderness, cyanosis or clubbing  SKIN: warm and dry without lesions    NEURO:  alert, approp, no deficits             Assessment & Plan:

## 2015-01-08 NOTE — Assessment & Plan Note (Signed)
Recurrent exacerbation despite maximum therapy  Consider allergy referral on return If persistent symptoms, consider CT sinus  Plan  Levaquin 500 milligrams daily Prednisone taper over the next week Mucinex DM twice daily as needed for cough and congestion Saline nasal rinses as needed Please contact office for sooner follow up if symptoms do not improve or worsen or seek emergency care  Follow up Dr. Vassie LollAlva  In 2 weeks and As needed

## 2015-01-08 NOTE — Addendum Note (Signed)
Addended by: Boone MasterJONES, JESSICA E on: 01/08/2015 04:57 PM   Modules accepted: Orders, Medications

## 2015-01-08 NOTE — Patient Instructions (Signed)
Levaquin 500 milligrams daily Prednisone taper over the next week Mucinex DM twice daily as needed for cough and congestion Saline nasal rinses as needed Please contact office for sooner follow up if symptoms do not improve or worsen or seek emergency care  Follow up Dr. Vassie LollAlva  In 2 weeks and As needed

## 2015-01-09 NOTE — Progress Notes (Signed)
Reviewed & agree with plan Will discuss xolair on future visit & review RAST

## 2015-01-12 ENCOUNTER — Ambulatory Visit (INDEPENDENT_AMBULATORY_CARE_PROVIDER_SITE_OTHER): Payer: 59 | Admitting: Internal Medicine

## 2015-01-12 VITALS — BP 110/70 | HR 88 | Temp 97.3°F | Resp 19 | Ht 64.5 in | Wt 177.6 lb

## 2015-01-12 DIAGNOSIS — M25562 Pain in left knee: Secondary | ICD-10-CM

## 2015-01-12 DIAGNOSIS — M25512 Pain in left shoulder: Secondary | ICD-10-CM

## 2015-01-12 DIAGNOSIS — M546 Pain in thoracic spine: Secondary | ICD-10-CM

## 2015-01-12 MED ORDER — CYCLOBENZAPRINE HCL 10 MG PO TABS: 10.0000 mg | ORAL_TABLET | Freq: Every day | ORAL | Status: DC

## 2015-01-12 MED ORDER — HYDROCODONE-ACETAMINOPHEN 10-325 MG PO TABS: 1.0000 | ORAL_TABLET | Freq: Three times a day (TID) | ORAL | Status: DC | PRN

## 2015-01-12 NOTE — Progress Notes (Signed)
Subjective:  This chart was scribed for Kelsey Sia, MD by Carl Best, Medical Scribe. This patient was seen in Room 11 and the patient's care was started at 3:11 PM.   Patient ID: Kelsey [name], female    DOB: 07-Dec-1980, 34 y.o.   MRN: [id number]  HPI HPI Comments: ANGELLE [name] is a 34 y.o. female who presents to the [facility] complaining of intermittent, non-radiating bilateral shoulder pain - right worse than left - that started 20 years ago.  She has more recently seen  Dr. Ranell Patrick who diagnosed her with a lax shoulder muscle and recommended she go to PT.  She experienced temporary relief to her symptoms while doing PT through [facility] but states that her symptoms returned and then have continued to be a problem particularly after work and at night while trying to sleep.  Stretching and applying pressure alleviates her pain. Massage helps greatly. Her symptoms are worse at night after she comes home from work.  She has applied ice and heat to her shoulders without relief to her symptoms . When the pain is completely at its worse and prevents sleep, she experiences relief to her symptoms when taking Vicodin and Flexeril.  She has not experienced these symptoms anywhere else on her body--no neck or lumbar injury in the past.  She denies neck pain and stiffness as associated symptoms.  At one point in her adolescence she was described as having scoliosis. She does not exercise regularly.    Past Medical History  Diagnosis Date  . PVC (premature ventricular contraction)   . Palpitations   . Pre-syncope   . Tachycardia   . Depression   . Insomnia   . Seasonal allergies   . Asthma   . Anxiety   . Depression   . Insomnia   . GERD (gastroesophageal reflux disease)    Past Surgical History  Procedure Laterality Date  . Cesarean section  10/15/2009, 02/08/2006    times 2   Family History  Problem Relation Age of Onset  . Heart disease Father   .  Rheumatologic disease Mother   . Lung cancer Paternal Grandmother    History   Social History  . Marital Status: Married    Spouse Name: N/A  . Number of Children: 2  . Years of Education: N/A   Occupational History  . respiratory therapists [place]   Social History Main Topics  . Smoking status: Never Smoker   . Smokeless tobacco: Never Used     Comment: reports "tried it as a teenager" but has not smoked since  . Alcohol Use: No  . Drug Use: No  . Sexual Activity: Not on file   Other Topics Concern  . Not on file   Social History Narrative   Allergies  Allergen Reactions  . Penicillins Rash    Review of Systems  Musculoskeletal: Positive for arthralgias. Negative for neck pain and neck stiffness.    Objective:  Physical Exam  Constitutional: She is oriented to person, place, and time. She appears well-developed and well-nourished.  Overweight  HENT:  Head: Normocephalic and atraumatic.  Eyes: EOM are normal. Pupils are equal, round, and reactive to light.  Neck: Normal range of motion. No thyromegaly present.  Cannot reproduce her pain with neck motion  Cardiovascular: Normal rate.   Pulmonary/Chest: Effort normal.  Musculoskeletal: Normal range of motion. She exhibits tenderness.  She has a kyphotic posture in the cervical area with tenderness  deep along the scapular border on the left and tenderness in the deltoid and anterior insertion of the biceps.  Shoulder has a good range of motion without pain.  Neck has a full range of motion without pain. No weakness with movement against resistance    Lymphadenopathy:    She has no cervical adenopathy.  Neurological: She is alert and oriented to person, place, and time.  Skin: Skin is warm and dry.  Psychiatric: She has a normal mood and affect. Her behavior is normal.  Nursing note and vitals reviewed.   BP 110/70 mmHg  Pulse 88  Temp(Src) 97.3 F (36.3 C) (Oral)  Resp 19  Ht 5' 4.5" (1.638 m)  Wt 177  lb 9.6 oz (80.559 kg)  BMI 30.03 kg/m2  SpO2 96% Assessment & Plan:    I have completed the patient encounter in its entirety as documented by the scribe, with editing by me where necessary. Tran Arzuaga P. Merla Richesoolittle, M.D.  Problem #1 chronic recurrent shoulder and thoracic pain  Exam suggests that this problem must be reflective of an overuse due to a musculoskeletal problem in another area, or 8  Tendinosis/tendinopathy about the shoulder girdle in more than one area that might benefit from nitroglycerin therapy  Refer to Kelsey SiaJohn O'Halloran for physical therapy Refer to Frazier Buttyan Draper for further shoulder evaluation It would be a good idea to continue massage and use medicines only when necessary Meds ordered this encounter  Medications  . HYDROcodone-acetaminophen (NORCO) 10-325 MG per tablet    Sig: Take 1 tablet by mouth every 8 (eight) hours as needed.    Dispense:  30 tablet    Refill:  0  . cyclobenzaprine (FLEXERIL) 10 MG tablet    Sig: Take 1 tablet (10 mg total) by mouth at bedtime.    Dispense:  30 tablet    Refill:  0

## 2015-01-13 ENCOUNTER — Telehealth: Payer: Self-pay

## 2015-01-13 NOTE — Telephone Encounter (Signed)
-----   Message from Tonye Pearsonobert P Doolittle, MD sent at 01/12/2015  5:05 PM EST ----- She needs a copy of this letter which I forgot to give her while she is here please call and see if she needs is to mail it or fax it so she will have proof that I have authorized massage therapy

## 2015-01-13 NOTE — Telephone Encounter (Signed)
Spoke with pt, she would like the letter mailed to her.Letter mailed.

## 2015-01-17 ENCOUNTER — Encounter (HOSPITAL_COMMUNITY): Payer: Self-pay | Admitting: *Deleted

## 2015-01-17 ENCOUNTER — Emergency Department (HOSPITAL_COMMUNITY)
Admission: EM | Admit: 2015-01-17 | Discharge: 2015-01-17 | Disposition: A | Discharge status: Home / Self Care | Payer: 59 | Attending: Emergency Medicine | Admitting: Emergency Medicine

## 2015-01-17 DIAGNOSIS — Z3202 Encounter for pregnancy test, result negative: Secondary | ICD-10-CM | POA: Insufficient documentation

## 2015-01-17 DIAGNOSIS — F419 Anxiety disorder, unspecified: Secondary | ICD-10-CM | POA: Insufficient documentation

## 2015-01-17 DIAGNOSIS — Z7951 Long term (current) use of inhaled steroids: Secondary | ICD-10-CM | POA: Diagnosis not present

## 2015-01-17 DIAGNOSIS — Z79899 Other long term (current) drug therapy: Secondary | ICD-10-CM | POA: Diagnosis not present

## 2015-01-17 DIAGNOSIS — Z8669 Personal history of other diseases of the nervous system and sense organs: Secondary | ICD-10-CM | POA: Diagnosis not present

## 2015-01-17 DIAGNOSIS — Z880 Allergy status to penicillin: Secondary | ICD-10-CM | POA: Insufficient documentation

## 2015-01-17 DIAGNOSIS — R000 Tachycardia, unspecified: Secondary | ICD-10-CM | POA: Diagnosis not present

## 2015-01-17 DIAGNOSIS — J45909 Unspecified asthma, uncomplicated: Secondary | ICD-10-CM | POA: Diagnosis not present

## 2015-01-17 DIAGNOSIS — Z8679 Personal history of other diseases of the circulatory system: Secondary | ICD-10-CM | POA: Insufficient documentation

## 2015-01-17 DIAGNOSIS — K219 Gastro-esophageal reflux disease without esophagitis: Secondary | ICD-10-CM | POA: Diagnosis not present

## 2015-01-17 DIAGNOSIS — A084 Viral intestinal infection, unspecified: Secondary | ICD-10-CM | POA: Insufficient documentation

## 2015-01-17 DIAGNOSIS — F329 Major depressive disorder, single episode, unspecified: Secondary | ICD-10-CM | POA: Diagnosis not present

## 2015-01-17 DIAGNOSIS — R1110 Vomiting, unspecified: Secondary | ICD-10-CM | POA: Diagnosis present

## 2015-01-17 LAB — CBC WITH DIFFERENTIAL/PLATELET
Basophils Absolute: 0 10*3/uL (ref 0.0–0.1)
Basophils Relative: 0 % (ref 0–1)
EOS ABS: 0.2 10*3/uL (ref 0.0–0.7)
EOS PCT: 2 % (ref 0–5)
HCT: 42 % (ref 36.0–46.0)
Hemoglobin: 13.9 g/dL (ref 12.0–15.0)
Lymphocytes Relative: 21 % (ref 12–46)
Lymphs Abs: 1.9 10*3/uL (ref 0.7–4.0)
MCH: 30 pg (ref 26.0–34.0)
MCHC: 33.1 g/dL (ref 30.0–36.0)
MCV: 90.5 fL (ref 78.0–100.0)
MONO ABS: 0.7 10*3/uL (ref 0.1–1.0)
Monocytes Relative: 8 % (ref 3–12)
Neutro Abs: 6.1 10*3/uL (ref 1.7–7.7)
Neutrophils Relative %: 69 % (ref 43–77)
Platelets: 299 10*3/uL (ref 150–400)
RBC: 4.64 MIL/uL (ref 3.87–5.11)
RDW: 12.9 % (ref 11.5–15.5)
WBC: 8.9 10*3/uL (ref 4.0–10.5)

## 2015-01-17 LAB — URINE MICROSCOPIC-ADD ON

## 2015-01-17 LAB — COMPREHENSIVE METABOLIC PANEL
ALBUMIN: 4 g/dL (ref 3.5–5.2)
ALT: 22 U/L (ref 0–35)
ANION GAP: 3 — AB (ref 5–15)
AST: 25 U/L (ref 0–37)
Alkaline Phosphatase: 72 U/L (ref 39–117)
BUN: 14 mg/dL (ref 6–23)
CO2: 23 mmol/L (ref 19–32)
CREATININE: 0.65 mg/dL (ref 0.50–1.10)
Calcium: 8.7 mg/dL (ref 8.4–10.5)
Chloride: 109 mmol/L (ref 96–112)
GFR calc Af Amer: >90 mL/min (ref >90)
GFR calc non Af Amer: >90 mL/min (ref >90)
Glucose, Bld: 121 mg/dL — ABNORMAL HIGH (ref 70–99)
Potassium: 4.2 mmol/L (ref 3.5–5.1)
SODIUM: 135 mmol/L (ref 135–145)
Total Bilirubin: 0.4 mg/dL (ref 0.3–1.2)
Total Protein: 7 g/dL (ref 6.0–8.3)

## 2015-01-17 LAB — URINALYSIS, ROUTINE W REFLEX MICROSCOPIC
BILIRUBIN URINE: NEGATIVE
GLUCOSE, UA: NEGATIVE mg/dL
Hgb urine dipstick: NEGATIVE
KETONES UR: NEGATIVE mg/dL
Nitrite: NEGATIVE
PROTEIN: NEGATIVE mg/dL
Specific Gravity, Urine: 1.023 (ref 1.005–1.030)
Urobilinogen, UA: 0.2 mg/dL (ref 0.0–1.0)
pH: 5.5 (ref 5.0–8.0)

## 2015-01-17 LAB — WET PREP, GENITAL
Clue Cells Wet Prep HPF POC: NONE SEEN
Trich, Wet Prep: NONE SEEN
Yeast Wet Prep HPF POC: NONE SEEN

## 2015-01-17 LAB — LIPASE, BLOOD: Lipase: 21 U/L (ref 11–59)

## 2015-01-17 LAB — POC URINE PREG, ED: Preg Test, Ur: NEGATIVE

## 2015-01-17 MED ORDER — ONDANSETRON HCL 4 MG/2ML IJ SOLN
4.0000 mg | Freq: Once | INTRAMUSCULAR | Status: AC
  Administered 2015-01-17: 4 mg via INTRAVENOUS
  Filled 2015-01-17: qty 2

## 2015-01-17 MED ORDER — ONDANSETRON 4 MG PO TBDP
4.0000 mg | ORAL_TABLET | Freq: Once | ORAL | Status: AC
  Administered 2015-01-17: 4 mg via ORAL
  Filled 2015-01-17: qty 1

## 2015-01-17 MED ORDER — ONDANSETRON 4 MG PO TBDP: ORAL_TABLET | ORAL | Status: DC

## 2015-01-17 MED ORDER — MORPHINE SULFATE 4 MG/ML IJ SOLN
4.0000 mg | Freq: Once | INTRAMUSCULAR | Status: AC
  Administered 2015-01-17: 4 mg via INTRAVENOUS
  Filled 2015-01-17: qty 1

## 2015-01-17 MED ORDER — SODIUM CHLORIDE 0.9 % IV BOLUS (SEPSIS)
1000.0000 mL | Freq: Once | INTRAVENOUS | Status: AC
  Administered 2015-01-17: 1000 mL via INTRAVENOUS

## 2015-01-17 NOTE — ED Notes (Signed)
Pt reports onset of n/v last night, has vomited several times this am and now having diarrhea and abd cramping.

## 2015-01-17 NOTE — ED Provider Notes (Signed)
CSN: [id number]     Arrival date & time 01/17/15  0728 History   First MD Initiated Contact with Patient 01/17/15 0732     Chief Complaint  Patient presents with  . Emesis  . Diarrhea     (Consider location/radiation/quality/duration/timing/severity/associated sxs/prior Treatment) The history is provided by the patient. No language interpreter was used.  Kelsey [name] is a 34 y/o F with PMHx of PVC, tachycardia, depression, insomnia, anxiety, GERD presenting to the ED with nausea, vomiting, diarrhea, abdominal cramping that started this morning. Patient reported that she had one episode of emesis last night around 11:00PM - mainly of food contents. Stated that she was feeling fine yesterday, reported that she had a chicken sandwich with fries last night. Patient reported that when she woke up this morning she felt nauseous, and on her way to work she stated that she threw up a couple of times. Patient reported that in total since this morning she has thrown up at least 4 times - mainly of food contents - NB/NB. Patient reported that she has had at least 2 episodes of diarrhea - loose stools without blood. Patient reported that she started to experience abdominal cramping as well - localized to the lower portion of her abdomen that is intermittent. Patient reported that she recently just got over a URI last week. Reported that her LMP she does not know - she is on the [employer]. Reported that she did receive her flu vaccine. Denied fever, chills, neck pain, neck stiffness, back pain, melena, hematochezia, dizziness, fainting, blurred vision, sudden loss of vision, dysuria, hematuria, chest pain, shortness of breath, difficulty breathing. Denies sick contacts.  PCP Dr. Nehemiah [name] Pulmonology Dr. Vassie [name]  Past Medical History  Diagnosis Date  . PVC (premature ventricular contraction)   . Palpitations   . Pre-syncope   . Tachycardia   . Depression   . Insomnia   . Seasonal allergies   . Asthma   .  Anxiety   . Depression   . Insomnia   . GERD (gastroesophageal reflux disease)    Past Surgical History  Procedure Laterality Date  . Cesarean section  10/15/2009, 02/08/2006    times 2   Family History  Problem Relation Age of Onset  . Heart disease Father   . Rheumatologic disease Mother   . Lung cancer Paternal Grandmother    History  Substance Use Topics  . Smoking status: Never Smoker   . Smokeless tobacco: Never Used     Comment: reports "tried it as a teenager" but has not smoked since  . Alcohol Use: No   OB History    No data available     Review of Systems  Constitutional: Negative for fever and chills.  Eyes: Negative for visual disturbance.  Respiratory: Negative for chest tightness and shortness of breath.   Cardiovascular: Negative for chest pain.  Gastrointestinal: Positive for nausea, vomiting, abdominal pain and diarrhea. Negative for constipation, blood in stool and anal bleeding.  Genitourinary: Negative for dysuria, hematuria, decreased urine volume, vaginal bleeding, vaginal discharge, vaginal pain and pelvic pain.  Musculoskeletal: Negative for back pain, neck pain and neck stiffness.  Neurological: Positive for light-headedness. Negative for dizziness and syncope.      Allergies  Penicillins  Home Medications   Prior to Admission medications   Medication Sig Start Date End Date Taking? Authorizing Provider  albuterol (PROVENTIL HFA;VENTOLIN HFA) 108 (90 BASE) MCG/ACT inhaler Inhale 2 puffs into the lungs every 4 (four) hours as needed (  For shortness of breath). 03/11/14  Yes Oretha Milch, MD  albuterol (PROVENTIL) (5 MG/ML) 0.5% nebulizer solution Take 5 mg by nebulization every 6 (six) hours as needed. For wheeze and shortness of breath   Yes Historical Provider, MD  ALPRAZolam (XANAX) 0.5 MG tablet Take 1 mg by mouth at bedtime as needed. For anxiety   Yes Historical Provider, MD  beclomethasone (QVAR) 80 MCG/ACT inhaler Inhale 1 puff into  the lungs 2 (two) times daily. 03/11/14  Yes Oretha Milch, MD  cetirizine (ZYRTEC) 10 MG tablet Take 10 mg by mouth daily.   Yes Historical Provider, MD  cyclobenzaprine (FLEXERIL) 10 MG tablet Take 1 tablet (10 mg total) by mouth at bedtime. 01/12/15  Yes Tonye Pearson, MD  desvenlafaxine (PRISTIQ) 50 MG 24 hr tablet Take 50 mg by mouth daily.   Yes Historical Provider, MD  esomeprazole (NEXIUM) 40 MG capsule Take 40 mg by mouth daily at 12 noon.   Yes Historical Provider, MD  HYDROcodone-acetaminophen (NORCO) 10-325 MG per tablet Take 1 tablet by mouth every 8 (eight) hours as needed. 01/12/15  Yes Tonye Pearson, MD  levofloxacin (LEVAQUIN) 500 MG tablet Take 1 tablet (500 mg total) by mouth daily. 01/08/15 01/18/15 Yes Tammy S Parrett, NP  mirtazapine (REMERON) 15 MG tablet Take 45 mg by mouth at bedtime.    Yes Historical Provider, MD  mometasone-formoterol (DULERA) 200-5 MCG/ACT AERO Inhale 2 puffs into the lungs 2 (two) times daily. 03/11/14  Yes Oretha Milch, MD  montelukast (SINGULAIR) 10 MG tablet Take 1 tablet (10 mg total) by mouth at bedtime. 09/29/13  Yes Oretha Milch, MD  predniSONE (DELTASONE) 10 MG tablet 4 tabs for 2 days, then 3 tabs for 2 days, 2 tabs for 2 days, then 1 tab for 2 days, then stop 01/08/15  Yes Tammy S Parrett, NP  levonorgestrel (MIRENA) 20 MCG/24HR IUD 1 each by Intrauterine route once.    Historical Provider, MD  meloxicam (MOBIC) 7.5 MG tablet Take 1 tablet (7.5 mg total) by mouth 2 (two) times daily after a meal. 11/30/14   Linna Hoff, MD  ondansetron (ZOFRAN ODT) 4 MG disintegrating tablet 4mg  ODT q12 hours prn nausea/vomit 01/17/15   Zetha Kuhar, PA-C   BP 114/83 mmHg  Pulse 82  Temp(Src) 97.7 F (36.5 C) (Oral)  Resp 18  SpO2 99% Physical Exam  Constitutional: She is oriented to person, place, and time. She appears well-developed and well-nourished. No distress.  HENT:  Head: Normocephalic and atraumatic.  Mouth/Throat: Oropharynx is clear  and moist. No oropharyngeal exudate.  Eyes: Conjunctivae and EOM are normal. Right eye exhibits no discharge. Left eye exhibits no discharge.  Neck: Normal range of motion. Neck supple.  Cardiovascular: Regular rhythm and normal heart sounds.  Tachycardia present.   Pulmonary/Chest: Effort normal and breath sounds normal. No respiratory distress. She has no wheezes. She has no rales. She exhibits no tenderness.  Abdominal: Soft. Bowel sounds are normal. She exhibits no distension. There is tenderness. There is no rebound and no guarding.  Genitourinary: No vaginal discharge found.  Pelvic Exam: Negative swelling, erythema, inflammation, lesions, sores deformities noted to the external genitaia. Negative blood in the vaginal vault. Thick white discharge noted with negative order. IUD identified and in place. Negative friability noted to the cervix. Negative CMT or adnexal tenderness bilaterally.  Exam chaperoned with tech, Noreene Larsson.   Musculoskeletal: Normal range of motion.  Neurological: She is alert and oriented to person, place,  and time. No cranial nerve deficit. She exhibits normal muscle tone. Coordination normal.  Skin: Skin is warm and dry. No rash noted. She is not diaphoretic. No erythema.  Psychiatric: She has a normal mood and affect. Her behavior is normal. Thought content normal.  Nursing note and vitals reviewed.   ED Course  Procedures (including critical care time)  Results for orders placed or performed during the hospital encounter of 01/17/15  Wet prep, genital  Result Value Ref Range   Yeast Wet Prep HPF POC NONE SEEN NONE SEEN   Trich, Wet Prep NONE SEEN NONE SEEN   Clue Cells Wet Prep HPF POC NONE SEEN NONE SEEN   WBC, Wet Prep HPF POC MANY (A) NONE SEEN  CBC with Differential/Platelet  Result Value Ref Range   WBC 8.9 4.0 - 10.5 K/uL   RBC 4.64 3.87 - 5.11 MIL/uL   Hemoglobin 13.9 12.0 - 15.0 g/dL   HCT 60.4 54.0 - 98.1 %   MCV 90.5 78.0 - 100.0 fL   MCH 30.0 26.0  - 34.0 pg   MCHC 33.1 30.0 - 36.0 g/dL   RDW 19.1 47.8 - 29.5 %   Platelets 299 150 - 400 K/uL   Neutrophils Relative % 69 43 - 77 %   Neutro Abs 6.1 1.7 - 7.7 K/uL   Lymphocytes Relative 21 12 - 46 %   Lymphs Abs 1.9 0.7 - 4.0 K/uL   Monocytes Relative 8 3 - 12 %   Monocytes Absolute 0.7 0.1 - 1.0 K/uL   Eosinophils Relative 2 0 - 5 %   Eosinophils Absolute 0.2 0.0 - 0.7 K/uL   Basophils Relative 0 0 - 1 %   Basophils Absolute 0.0 0.0 - 0.1 K/uL  Comprehensive metabolic panel  Result Value Ref Range   Sodium 135 135 - 145 mmol/L   Potassium 4.2 3.5 - 5.1 mmol/L   Chloride 109 96 - 112 mmol/L   CO2 23 19 - 32 mmol/L   Glucose, Bld 121 (H) 70 - 99 mg/dL   BUN 14 6 - 23 mg/dL   Creatinine, Ser 6.21 0.50 - 1.10 mg/dL   Calcium 8.7 8.4 - 30.8 mg/dL   Total Protein 7.0 6.0 - 8.3 g/dL   Albumin 4.0 3.5 - 5.2 g/dL   AST 25 0 - 37 U/L   ALT 22 0 - 35 U/L   Alkaline Phosphatase 72 39 - 117 U/L   Total Bilirubin 0.4 0.3 - 1.2 mg/dL   GFR calc non Af Amer >90 >90 mL/min   GFR calc Af Amer >90 >90 mL/min   Anion gap 3 (L) 5 - 15  Lipase, blood  Result Value Ref Range   Lipase 21 11 - 59 U/L  Urinalysis, Routine w reflex microscopic  Result Value Ref Range   Color, Urine YELLOW YELLOW   APPearance HAZY (A) CLEAR   Specific Gravity, Urine 1.023 1.005 - 1.030   pH 5.5 5.0 - 8.0   Glucose, UA NEGATIVE NEGATIVE mg/dL   Hgb urine dipstick NEGATIVE NEGATIVE   Bilirubin Urine NEGATIVE NEGATIVE   Ketones, ur NEGATIVE NEGATIVE mg/dL   Protein, ur NEGATIVE NEGATIVE mg/dL   Urobilinogen, UA 0.2 0.0 - 1.0 mg/dL   Nitrite NEGATIVE NEGATIVE   Leukocytes, UA MODERATE (A) NEGATIVE  Urine microscopic-add on  Result Value Ref Range   Squamous Epithelial / LPF MANY (A) RARE   WBC, UA 7-10 <3 WBC/hpf   RBC / HPF 0-2 <3 RBC/hpf   Bacteria,  UA MANY (A) RARE   Urine-Other MUCOUS PRESENT   POC urine preg, ED (not at [facility])  Result Value Ref Range   Preg Test, Ur NEGATIVE NEGATIVE    Labs  Review Labs Reviewed  WET PREP, GENITAL - Abnormal; Notable for the following:    WBC, Wet Prep HPF POC MANY (*)    All other components within normal limits  COMPREHENSIVE METABOLIC PANEL - Abnormal; Notable for the following:    Glucose, Bld 121 (*)    Anion gap 3 (*)    All other components within normal limits  URINALYSIS, ROUTINE W REFLEX MICROSCOPIC - Abnormal; Notable for the following:    APPearance HAZY (*)    Leukocytes, UA MODERATE (*)    All other components within normal limits  URINE MICROSCOPIC-ADD ON - Abnormal; Notable for the following:    Squamous Epithelial / LPF MANY (*)    Bacteria, UA MANY (*)    All other components within normal limits  URINE CULTURE  CBC WITH DIFFERENTIAL/PLATELET  LIPASE, BLOOD  POC URINE PREG, ED  GC/CHLAMYDIA PROBE AMP ([place])    Imaging Review No results found.   EKG Interpretation None       10:24 PM Re-assessed patient. Patient reported that she's feeling much better. Patient sitting upright drinking water without difficulty. Negative episodes of emesis in ED setting. Abdominal exam repeated-bowel sounds normoactive in all 4 quadrants, abdomen soft, negative pain upon palpation to the abdomen. Discussed labs in great detail with patient plan for discharge, patient is in agreement for discharge.  MDM   Final diagnoses:  Viral gastroenteritis    Medications  sodium chloride 0.9 % bolus 1,000 mL (0 mLs Intravenous Stopped 01/17/15 0951)  ondansetron (ZOFRAN) injection 4 mg (4 mg Intravenous Given 01/17/15 0800)  morphine 4 MG/ML injection 4 mg (4 mg Intravenous Given 01/17/15 0818)  ondansetron (ZOFRAN-ODT) disintegrating tablet 4 mg (4 mg Oral Given 01/17/15 0953)    Filed Vitals:   01/17/15 0734 01/17/15 0830 01/17/15 0957  BP: 145/100 112/81 114/83  Pulse: 85 83 82  Temp: 97.7 F (36.5 C)    TempSrc: Oral    Resp: 19  18  SpO2: 96% 99% 99%   CBC negative elevated leukocytosis. Hemoglobin 13.9, hematocrit 42.0.  CMP unremarkable-glucose 121 with negative elevated anion gap-3.0 mEq per liter. Lipase negative elevation. Urinalysis noted moderate leukocytes with white blood cell count of 7-10 with many squamous cells-appears to be a contaminated specimen, urine culture ordered and pending. Urine pregnancy negative. Wet prep wet prep noted many white blood cells. GC Chlamydia probe pending. Doubt pelvic inflammatory processes. Abdomen soft, benign-doubt acute abdominal processes - no imaging warranted at this time. IV fluids, antiemetics and pain medications administered in ED setting with positive relief. Upon arrival to the ED patient was tachycardic with a heart rate 110 bpm, after medications and fluids administered patient's heart rate decreased to 83 bpm. patient able to tolerate fluids by mouth-no episodes of emesis in ED setting. Suspicion to be viral gastroenteritis. Patient stable, afebrile. Patient not septic appearing. Discharged patient. Discharge patient with Zofran. Referred patient to PCP. Discussed with patient to rest and stay hydrated. Discussed with patient to closely monitor symptoms and if symptoms are to worsen or change to report back to the ED - strict return instructions given.  Patient agreed to plan of care, understood, all questions answered.   Raymon Mutton, PA-C 01/17/15 1032  Samuel Jester, DO 01/20/15 [phone]

## 2015-01-17 NOTE — Discharge Instructions (Signed)
Please call your doctor for a followup appointment within 24-48 hours. When you talk to your doctor please let them know that you were seen in the emergency department and have them acquire all of your records so that they can discuss the findings with you and formulate a treatment plan to fully care for your new and ongoing problems. Please follow up with your primary care provider Please rest and stay hydrated - please drink plenty of water  Please take medications as prescribed - please no more than 12 mg of zofran per day Please continue to monitor symptoms closely and if symptoms are to worsen or change (fever greater than 101, chills, sweating, nausea, vomiting, chest pain, shortness of breathe, difficulty breathing, weakness, numbness, tingling, worsening or changes to pain pattern, stomach pain becomes more focal, inability keep food or fluids down, blood in stools, black tarry stools) please report back to the Emergency Department immediately.   Viral Gastroenteritis Viral gastroenteritis is also known as stomach flu. This condition affects the stomach and intestinal tract. It can cause sudden diarrhea and vomiting. The illness typically lasts 3 to 8 days. Most people develop an immune response that eventually gets rid of the virus. While this natural response develops, the virus can make you quite ill. CAUSES  Many different viruses can cause gastroenteritis, such as rotavirus or noroviruses. You can catch one of these viruses by consuming contaminated food or water. You may also catch a virus by sharing utensils or other personal items with an infected person or by touching a contaminated surface. SYMPTOMS  The most common symptoms are diarrhea and vomiting. These problems can cause a severe loss of body fluids (dehydration) and a body salt (electrolyte) imbalance. Other symptoms may include:  Fever.  Headache.  Fatigue.  Abdominal pain. DIAGNOSIS  Your caregiver can usually diagnose  viral gastroenteritis based on your symptoms and a physical exam. A stool sample may also be taken to test for the presence of viruses or other infections. TREATMENT  This illness typically goes away on its own. Treatments are aimed at rehydration. The most serious cases of viral gastroenteritis involve vomiting so severely that you are not able to keep fluids down. In these cases, fluids must be given through an intravenous line (IV). HOME CARE INSTRUCTIONS   Drink enough fluids to keep your urine clear or pale yellow. Drink small amounts of fluids frequently and increase the amounts as tolerated.  Ask your caregiver for specific rehydration instructions.  Avoid:  Foods high in sugar.  Alcohol.  Carbonated drinks.  Tobacco.  Juice.  Caffeine drinks.  Extremely hot or cold fluids.  Fatty, greasy foods.  Too much intake of anything at one time.  Dairy products until 24 to 48 hours after diarrhea stops.  You may consume probiotics. Probiotics are active cultures of beneficial bacteria. They may lessen the amount and number of diarrheal stools in adults. Probiotics can be found in yogurt with active cultures and in supplements.  Wash your hands well to avoid spreading the virus.  Only take over-the-counter or prescription medicines for pain, discomfort, or fever as directed by your caregiver. Do not give aspirin to children. Antidiarrheal medicines are not recommended.  Ask your caregiver if you should continue to take your regular prescribed and over-the-counter medicines.  Keep all follow-up appointments as directed by your caregiver. SEEK IMMEDIATE MEDICAL CARE IF:   You are unable to keep fluids down.  You do not urinate at least once every 6 to  8 hours.  You develop shortness of breath.  You notice blood in your stool or vomit. This may look like coffee grounds.  You have abdominal pain that increases or is concentrated in one small area (localized).  You have  persistent vomiting or diarrhea.  You have a fever.  The patient is a child younger than 3 months, and he or she has a fever.  The patient is a child older than 3 months, and he or she has a fever and persistent symptoms.  The patient is a child older than 3 months, and he or she has a fever and symptoms suddenly get worse.  The patient is a baby, and he or she has no tears when crying. MAKE SURE YOU:   Understand these instructions.  Will watch your condition.  Will get help right away if you are not doing well or get worse. Document Released: 11/13/2005 Document Revised: 02/05/2012 Document Reviewed: 08/30/2011 [facility] Patient Information 2015 [place], [place]. This information is not intended to replace advice given to you by your health care provider. Make sure you discuss any questions you have with your health care provider.

## 2015-01-18 LAB — GC/CHLAMYDIA PROBE AMP (~~LOC~~) NOT AT ARMC
Chlamydia: NEGATIVE
Neisseria Gonorrhea: NEGATIVE

## 2015-01-19 ENCOUNTER — Ambulatory Visit (INDEPENDENT_AMBULATORY_CARE_PROVIDER_SITE_OTHER): Payer: 59 | Admitting: Sports Medicine

## 2015-01-19 ENCOUNTER — Encounter: Payer: Self-pay | Admitting: Sports Medicine

## 2015-01-19 VITALS — BP 118/84 | HR 80 | Ht 64.0 in | Wt 165.0 lb

## 2015-01-19 DIAGNOSIS — M898X1 Other specified disorders of bone, shoulder: Secondary | ICD-10-CM

## 2015-01-19 DIAGNOSIS — G2589 Other specified extrapyramidal and movement disorders: Secondary | ICD-10-CM

## 2015-01-19 LAB — URINE CULTURE
Colony Count: >=100000
Special Requests: NORMAL

## 2015-01-19 NOTE — Progress Notes (Signed)
Subjective:   CC: Left shoulder pain  HPI:   Patient reports ~20 years of left shoulder pain that feels deep under her shoulder blade. Denies h/o injury. Pain is "sharp, stabbing, and burning" and occasionally radiates to anterior shoulder. She denies numbness/tingling or weakness of arm. Pain does not radiate down arm. 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 Very deep massage, flexeril, and vicodin make pain feel better. She has tried PT (years ago) and antiinflammatories (recently finished course of prednisone) which have not helped. She is right handed.   Review of Systems - Per HPI. Additionally, no chest pain, dyspnea, or pain with deep breathing.  PMH - PVC LBBB, asthma, GERD, L thoracic back pain chronic, anxiety SH: RT at [facility]    Objective:  Physical Exam BP 118/84 mmHg  Pulse 80  Ht 5\' 4"  (1.626 m)  Wt 165 lb (74.844 kg)  BMI 28.31 kg/m2 GEN: NAD EXTR:  Left shoulder:   No atrophy or deformity on inspection   Mild generalized tenderness anterior and posterior shoulder   Full ROM   5/5 shoulder abduction    Negative Empty can and Hawkin's signs for impingement   Negative Speeds and Yerguson's tests for biceps tendon injury   Scapular mobility with some catching palpated under left scapula; no winging   Neck forward posture   Neck ROM full with no discomfort    Assessment:     Kelsey [name] is a 34 y.o. female here for left periscapular pain.    Plan:     # See problem list and after visit summary for problem-specific plans.   Follow-up: Follow up in 6 weeks for f/u of left periscapular pain.   Kelsey SingletonMaria T Trishia Cuthrell, MD [facility]

## 2015-01-19 NOTE — Assessment & Plan Note (Signed)
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 No rotator cuff pathology on exam and no deficiency of c-spine ROM. - Referral to PT placed for scapular stabilization exercises and posture. - Discussed with patient that this could be slow improvement and requires committed to exercises. - F/u in 6 weeks or sooner PRN.

## 2015-01-19 NOTE — Patient Instructions (Signed)
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 Physical therapy will be the mainstay of treatment for this.  Be sure to learn the home exercise program at the therapy office. Follow up in 6 weeks to see how you are doing.  Best.

## 2015-02-03 ENCOUNTER — Ambulatory Visit: Payer: 59 | Attending: Sports Medicine | Admitting: Physical Therapy

## 2015-02-03 ENCOUNTER — Ambulatory Visit: Payer: 59 | Admitting: Pulmonary Disease

## 2015-02-03 DIAGNOSIS — M6281 Muscle weakness (generalized): Secondary | ICD-10-CM | POA: Insufficient documentation

## 2015-02-03 DIAGNOSIS — R293 Abnormal posture: Secondary | ICD-10-CM | POA: Insufficient documentation

## 2015-02-03 NOTE — Patient Instructions (Signed)
  EXTENSION: Standing - Resistance Band: Stable (Active)   Stand, right arm at side. Against yellow resistance band, draw arm backward, as far as possible, keeping elbow straight. Complete _1-2__ sets of __10_ repetitions. Perform __1-2_ sessions per day.  Copyright  VHI. All rights reserved.  Row: Mid-Range - Standing   With yellow band anchored at chest level, pull elbows backward, squeezing shoulder blades together. Keep head and spine neutral. Row __10-20_ times, _2__ times per day.  http://ss.exer.us/290   Copyright  VHI. All rights reserved.  Resistive Band Rowing   With resistive band anchored in door, grasp both ends. Keeping elbows bent, pull back, squeezing shoulder blades together. Hold ___3-5_ seconds. Repeat __10__ times. Do ___1-2_ sessions per day. Try another set with arms away from the body.  http://gt2.exer.us/97   Copyright  VHI. All rights reserved.   Levator Scapula Stretch   Place left hand on same side shoulder blade. With other hand, gently stretch head down and away. Hold __30__ seconds. Repeat ___2-3_ times per set. Do ___1_ sets per session. Do _2___ sessions per day.  http://orth.exer.us/349   Copyright  VHI. All rights reserved.  CORNER/DOORWAY STRETCH, HOLD 30 sec x3 , can move arms further up to get more of a stretch.    KNOTTY BALL AT [employer]

## 2015-02-03 NOTE — Therapy (Signed)
[facility] [address] [place], [place], [address] Phone: [phone]   Fax:  [phone]  Physical Therapy Evaluation  Patient Details  Name: Kelsey [name] MRN: [id number] Date of Birth: 1981-09-04 Referring Provider:  Ralene Cork, DO  Encounter Date: 02/03/2015      PT End of Session - 02/03/15 1530    Visit Number 1   Number of Visits 12   Date for PT Re-Evaluation 03/17/15   PT Start Time 1308   PT Stop Time 1347   PT Time Calculation (min) 39 min   Activity Tolerance Patient tolerated treatment well      Past Medical History  Diagnosis Date  . PVC (premature ventricular contraction)   . Palpitations   . Pre-syncope   . Tachycardia   . Depression   . Insomnia   . Seasonal allergies   . Asthma   . Anxiety   . Depression   . Insomnia   . GERD (gastroesophageal reflux disease)     Past Surgical History  Procedure Laterality Date  . Cesarean section  10/15/2009, 02/08/2006    times 2    There were no vitals taken for this visit.  Visit Diagnosis:  Abnormal posture - Plan: PT plan of care cert/re-cert  Muscle weakness of left upper extremity - Plan: PT plan of care cert/re-cert      Subjective Assessment - 02/03/15 1312    Symptoms Pt with chronic L shoulder pain (scapular) which has been bothering her for 15 yrs, intermittently.  She has tried PT before with some success.  Denies N/T, weakness, neck pain.     Limitations House hold activities;Lifting  working, using arms   Diagnostic tests XR   Patient Stated Goals To have relief of this pain.   Currently in Pain? Yes   Pain Score 2    Pain Location Scapula   Pain Orientation Left   Pain Descriptors / Indicators Aching   Pain Type Chronic pain   Pain Onset More than a month ago   Pain Frequency Intermittent   Aggravating Factors  using arms   Pain Relieving Factors OTC, massage, heat, ice   Effect of Pain on Daily Activities difficulty  with work   Multiple Pain Sites No          [facility] PT Assessment - 02/03/15 1317    Assessment   Medical Diagnosis scapular dyskinesis   Onset Date --  chronic   Prior Therapy Yes   Precautions   Precautions None   Restrictions   Weight Bearing Restrictions No   Balance Screen   Has the patient fallen in the past 6 months No   Sensation   Light Touch Appears Intact   Posture/Postural Control   Posture/Postural Control Postural limitations   Postural Limitations Rounded Shoulders;Forward head;Increased thoracic kyphosis   Posture Comments L scap abducted from midline> Rt.    AROM   Right/Left Shoulder --  WNL   Strength   Strength Assessment Site --  rhom Rt 3+/5, L 3/5   Right Shoulder Flexion 4+/5   Right Shoulder ABduction 4+/5   Right Shoulder Internal Rotation 4+/5   Right Shoulder External Rotation 4+/5   Left Shoulder Flexion 4/5   Left Shoulder ABduction 4/5   Left Shoulder Internal Rotation 4/5   Left Shoulder External Rotation 4+/5   Palpation   Palpation pain at levator scap insertion and rhomboid, taut bands pt tol. deep pressure. Scap mobility is good.  Crepitus  with UE flexion, abd.  No pain with palpation to subscap, post cuff.                           PT Education - 02/03/15 1529    Education provided Yes   Education Details PT/POC, HEP, massaging with tennis/spiky ball   Person(s) Educated Patient   Methods Explanation;Demonstration;Handout   Comprehension Verbalized understanding;Returned demonstration;Need further instruction             PT Long Term Goals - 02/03/15 1533    PT LONG TERM GOAL #1   Title Pt will be I with HEP for posture and UE strength   Time 6   Period Weeks   Status New   PT LONG TERM GOAL #2   Title Pt will be able to work a full shift and report pain </=3/10 most of the time   Time 6   Period Weeks   Status New   PT LONG TERM GOAL #3   Title Pt will have no pain at rest 75% of the time when  not working.    Time 6   Period Weeks   Status New   PT LONG TERM GOAL #4   Title Pt will demo good posture and lifting techniques to prevent re-injury.    Time 6   Period Weeks   Status New               Plan - 02/03/15 1530    Clinical Impression Statement Pt will benefit from corrective exercises to improve scapular stability, UE strength and posture.    Pt will benefit from skilled therapeutic intervention in order to improve on the following deficits Pain;Improper body mechanics;Increased fascial restricitons;Decreased strength;Increased muscle spasms;Postural dysfunction   Rehab Potential Good   PT Frequency 2x / week  1-2 times   PT Duration 6 weeks  4-6 weeks if progressing   PT Treatment/Interventions ADLs/Self Care Home Management;Therapeutic exercise;Patient/family education;Moist Heat;Neuromuscular re-education;Manual techniques;Cryotherapy;Ultrasound;Dry needling   PT Next Visit Plan manual to L rhomboid, lev scap, try taping, check HEP, dry needling!!   PT Home Exercise Plan lev scap stretch, corner stretch, row, ext   Consulted and Agree with Plan of Care Patient         Problem List Patient Active Problem List   Diagnosis Date Noted  . Periscapular pain 01/19/2015  . Left-sided thoracic back pain-greater than 20 years duration 01/12/2015  . GERD (gastroesophageal reflux disease) 08/22/2013  . Asthma exacerbation 06/13/2012  . 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 Abnormal ECG- T  ST changes in V3 through V5 05/02/2011    [name],[name] 02/03/2015, 3:47 PM  [facility] [address] [place], [place], [address] Phone: [phone]   Fax:  [phone]

## 2015-02-04 ENCOUNTER — Encounter: Payer: Self-pay | Admitting: Pulmonary Disease

## 2015-02-04 ENCOUNTER — Ambulatory Visit (INDEPENDENT_AMBULATORY_CARE_PROVIDER_SITE_OTHER): Payer: 59 | Admitting: Pulmonary Disease

## 2015-02-04 VITALS — BP 122/68 | HR 95 | Temp 98.0°F | Ht 64.0 in | Wt 170.0 lb

## 2015-02-04 DIAGNOSIS — J45901 Unspecified asthma with (acute) exacerbation: Secondary | ICD-10-CM

## 2015-02-04 MED ORDER — BUDESONIDE-FORMOTEROL FUMARATE 160-4.5 MCG/ACT IN AERO: 2.0000 | INHALATION_SPRAY | Freq: Two times a day (BID) | RESPIRATORY_TRACT | Status: DC

## 2015-02-04 MED ORDER — MONTELUKAST SODIUM 10 MG PO TABS: 10.0000 mg | ORAL_TABLET | Freq: Every day | ORAL | Status: DC

## 2015-02-04 NOTE — Assessment & Plan Note (Addendum)
Change to symbicort 160 2 puffs twice daily Stay on Qvar Refills on singulair We will submit paperwork for Xolair - due to frequent prednisone requirement, I think she will benefit from immunotherapy. IgE level was 292- will need 225 g every 2 weeks Call if worse

## 2015-02-04 NOTE — Patient Instructions (Signed)
Change to symbicort 160 2 puffs twice daily Stay on Qvar Refills on singulair We will submit paperwork for Xolair  Call if worse

## 2015-02-04 NOTE — Progress Notes (Signed)
   Subjective:    Patient ID: Kelsey [name], female    DOB: 07/01/1981, 34 y.o.   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   She had frequent flares in 2013, stable x 1 yr, & again flares since 06/2013.  Asthma triggers include heat humidity allergies and cats  Environment - she has lived in the same house since 2009. She has a dog since 2004. She has perennial allergies and took allergy shots until age 34. Her symptoms are not worse at work and she does not describe any triggers at work.   Significant tests/ events  Of note she had a rheumatological evaluation by Dr. Nickola MajorHawkes, ANA, RA factor was negative sedimentation rate was 3, CPK was 44, thyroid function was normal  RAST 2013 - High titers to cat & dog dander, grass & house dust  PFTs - November 2013 showed FEV1 of 1.87-59% improving to 2.24-70% with bronchodilator.   Sep 2014 severe asthma exacerbation requiring hospitalization. Due to her frequent symptoms -extended pred taper until jan 2015   02/04/2015  Chief Complaint  Patient presents with  . Follow-up    Pt has no breathing complaints at this time.  Pt has had several flare-ups in the past 6 months.    Several flareups in 2015, most recent one in 12/2014 She is now improved after prednisone taper. No obvious environmental or occupational triggers. She has a dog for 10 years Of note,rast in 2013 showed high IgE  Past Medical History  Diagnosis Date  . PVC (premature ventricular contraction)   . Palpitations   . Pre-syncope   . Tachycardia   . Depression   . Insomnia   . Seasonal allergies   . Asthma   . Anxiety   . Depression   . Insomnia   . GERD (gastroesophageal reflux disease)      Review of Systems neg for any significant sore throat, dysphagia, itching, sneezing, nasal congestion or excess/ purulent secretions, fever, chills, sweats, unintended wt loss, pleuritic or  exertional cp, hempoptysis, orthopnea pnd or change in chronic leg swelling. Also denies presyncope, palpitations, heartburn, abdominal pain, nausea, vomiting, diarrhea or change in bowel or urinary habits, dysuria,hematuria, rash, arthralgias, visual complaints, headache, numbness weakness or ataxia.     Objective:   Physical Exam  Gen. Pleasant, well-nourished, in no distress ENT - no lesions, no post nasal drip Neck: No JVD, no thyromegaly, no carotid bruits Lungs: no use of accessory muscles, no dullness to percussion, clear without rales or rhonchi  Cardiovascular: Rhythm regular, heart sounds  normal, no murmurs or gallops, no peripheral edema Musculoskeletal: No deformities, no cyanosis or clubbing        Assessment & Plan:

## 2015-02-09 ENCOUNTER — Other Ambulatory Visit: Payer: Self-pay | Admitting: Pulmonary Disease

## 2015-02-15 ENCOUNTER — Ambulatory Visit: Payer: 59 | Admitting: Physical Therapy

## 2015-02-15 DIAGNOSIS — R293 Abnormal posture: Secondary | ICD-10-CM | POA: Diagnosis not present

## 2015-02-15 DIAGNOSIS — M6281 Muscle weakness (generalized): Secondary | ICD-10-CM

## 2015-02-15 NOTE — Therapy (Signed)
[facility] [address] [place], [place], [address] Phone: [phone]   Fax:  [phone]  Physical Therapy Treatment  Patient Details  Name: Kelsey [name] MRN: [id number] Date of Birth: Sep 19, 1981 Referring Provider:  Renford DillsPolite, Ronald, MD  Encounter Date: 02/15/2015      PT End of Session - 02/15/15 1047    Visit Number 2   Number of Visits 12   Date for PT Re-Evaluation 03/17/15   PT Start Time 0848   PT Stop Time 0935   PT Time Calculation (min) 47 min   Activity Tolerance Patient tolerated treatment well      Past Medical History  Diagnosis Date  . PVC (premature ventricular contraction)   . Palpitations   . Pre-syncope   . Tachycardia   . Depression   . Insomnia   . Seasonal allergies   . Asthma   . Anxiety   . Depression   . Insomnia   . GERD (gastroesophageal reflux disease)     Past Surgical History  Procedure Laterality Date  . Cesarean section  10/15/2009, 02/08/2006    times 2    There were no vitals filed for this visit.  Visit Diagnosis:  Muscle weakness of left upper extremity                     [facility] Adult PT Treatment/Exercise - 02/15/15 0850    Posture/Postural Control   Postural Limitations --  can get shouloders to neutral passively.   Shoulder Exercises: ROM/Strengthening   Other ROM/Strengthening Exercises band exercises practiced.  she had them memorized.  good techniques.  mid and shoulder row  and shoulder extension 10 reps each   Manual Therapy   Manual Therapy Other (comment)   Other Manual Therapy soft tissue work  upper back neck upper pecks, neck also taping to rhomboids and for edema upper back.                PT Education - 02/15/15 1051    Education provided Yes   Education Details dry needle info   Person(s) Educated Patient   Methods Handout             PT Long Term Goals - 02/15/15 1052    PT LONG TERM GOAL #1   Title Pt will  be I with HEP for posture and UE strength   Status On-going   PT LONG TERM GOAL #2   Title Pt will be able to work a full shift and report pain </=3/10 most of the time   Status On-going   PT LONG TERM GOAL #3   Title Pt will have no pain at rest 75% of the time when not working.    Status On-going   PT LONG TERM GOAL #4   Title Pt will demo good posture and lifting techniques to prevent re-injury.    Status On-going               Plan - 02/15/15 1048    Clinical Impression Statement has a good start on home exercise.  She Is interested in dry needle.          Problem List Patient Active Problem List   Diagnosis Date Noted  . Periscapular pain 01/19/2015  . Left-sided thoracic back pain-greater than 20 years duration 01/12/2015  . GERD (gastroesophageal reflux disease) 08/22/2013  . Asthma exacerbation 06/13/2012  . 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 Abnormal  ECG- T  ST changes in V3 through V5 05/02/2011   Liz Beach, PTA 02/15/2015 10:53 AM Phone: [phone] Fax: [phone]   [facility] 02/15/2015, 10:53 AM  [facility] [address] [place], [place], [address] Phone: [phone]   Fax:  [phone]

## 2015-02-15 NOTE — Patient Instructions (Signed)
Remove tape if irritating 

## 2015-02-24 ENCOUNTER — Ambulatory Visit: Payer: 59 | Admitting: Physical Therapy

## 2015-02-24 DIAGNOSIS — R293 Abnormal posture: Secondary | ICD-10-CM

## 2015-02-24 DIAGNOSIS — M6281 Muscle weakness (generalized): Secondary | ICD-10-CM

## 2015-02-24 NOTE — Therapy (Signed)
[facility] [address] [place], [place], [address] Phone: [phone]   Fax:  [phone]  Physical Therapy Treatment  Patient Details  Name: Kelsey [name] MRN: [id number] Date of Birth: 1981/03/12 Referring Provider:  Renford DillsPolite, Ronald, MD  Encounter Date: 02/24/2015      PT End of Session - 02/24/15 1148    Visit Number 3   Number of Visits 12   Date for PT Re-Evaluation 03/17/15   PT Start Time 1018   PT Stop Time 1105   PT Time Calculation (min) 47 min      Past Medical History  Diagnosis Date  . PVC (premature ventricular contraction)   . Palpitations   . Pre-syncope   . Tachycardia   . Depression   . Insomnia   . Seasonal allergies   . Asthma   . Anxiety   . Depression   . Insomnia   . GERD (gastroesophageal reflux disease)     Past Surgical History  Procedure Laterality Date  . Cesarean section  10/15/2009, 02/08/2006    times 2    There were no vitals filed for this visit.  Visit Diagnosis:  Abnormal posture  Muscle weakness of left upper extremity      Subjective Assessment - 02/24/15 1111    Symptoms Tape is helpful.  Lt upper trap this am. is sore. Exercises are going well.  Was able to work                       [facility] Treatment/Exercise - 02/24/15 1030    Manual Therapy   Manual Therapy --  Neuromusculat trigger point release. Lt scapular upper trap    Other Manual Therapy able totape as previous rhomboids and for edema                     PT Long Term Goals - 02/24/15 1212    PT LONG TERM GOAL #2   Baseline 5/10   Time 6   Period Weeks   Status On-going   PT LONG TERM GOAL #3   Title Pt will have no pain at rest 75% of the time when not working.    Baseline intermittant   Time 6   Period Weeks   Status On-going   PT LONG TERM GOAL #4   Title Pt will demo good posture and lifting techniques to prevent re-injury.    Time 6   Period  Weeks   Status On-going               Plan - 02/24/15 1150    Clinical Impression Statement Less pain with PT, pain  at rest is now intermittant   Pt will benefit from skilled therapeutic intervention in order to improve on the following deficits Increased muscle spasms        Problem List Patient Active Problem List   Diagnosis Date Noted  . Periscapular pain 01/19/2015  . Left-sided thoracic back pain-greater than 20 years duration 01/12/2015  . GERD (gastroesophageal reflux disease) 08/22/2013  . Asthma exacerbation 06/13/2012  . 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 Abnormal ECG- T  ST changes in V3 through V5 05/02/2011  Liz BeachKaren Relena Ivancic, PTA 02/24/2015 12:16 PM Phone: [phone] Fax: [phone]   [facility],Arn Mcomber 02/24/2015, 12:16 PM  [facility] [address] [place], [place], [address] Phone: [phone]   Fax:  [phone]

## 2015-03-02 ENCOUNTER — Encounter: Payer: 59 | Admitting: Physical Therapy

## 2015-03-04 ENCOUNTER — Other Ambulatory Visit: Payer: Self-pay | Admitting: Internal Medicine

## 2015-03-08 ENCOUNTER — Ambulatory Visit: Payer: 59 | Attending: Sports Medicine | Admitting: Physical Therapy

## 2015-03-08 ENCOUNTER — Other Ambulatory Visit: Payer: Self-pay | Admitting: *Deleted

## 2015-03-08 DIAGNOSIS — R293 Abnormal posture: Secondary | ICD-10-CM | POA: Insufficient documentation

## 2015-03-08 DIAGNOSIS — M6281 Muscle weakness (generalized): Secondary | ICD-10-CM | POA: Diagnosis not present

## 2015-03-08 DIAGNOSIS — M62838 Other muscle spasm: Secondary | ICD-10-CM

## 2015-03-08 MED ORDER — TRAMADOL HCL 50 MG PO TABS: 50.0000 mg | ORAL_TABLET | Freq: Two times a day (BID) | ORAL | Status: DC | PRN

## 2015-03-08 NOTE — Therapy (Signed)
[facility] [address] [place], [place], [address] Phone: [phone]   Fax:  [phone]  Physical Therapy Treatment  Patient Details  Name: Kelsey [name] MRN: [id number] Date of Birth: 08/30/1981 Referring Provider:  Renford Dills, MD  Encounter Date: 03/08/2015      PT End of Session - 03/08/15 1551    Visit Number 4   Number of Visits 12   Date for PT Re-Evaluation 03/17/15   PT Start Time 0350   PT Stop Time 0446   PT Time Calculation (min) 56 min   Activity Tolerance Patient tolerated treatment well   Behavior During Therapy [place] for tasks assessed/performed      Past Medical History  Diagnosis Date  . PVC (premature ventricular contraction)   . Palpitations   . Pre-syncope   . Tachycardia   . Depression   . Insomnia   . Seasonal allergies   . Asthma   . Anxiety   . Depression   . Insomnia   . GERD (gastroesophageal reflux disease)     Past Surgical History  Procedure Laterality Date  . Cesarean section  10/15/2009, 02/08/2006    times 2    There were no vitals filed for this visit.  Visit Diagnosis:  Abnormal posture  Muscle weakness of left upper extremity  Muscle spasm      Subjective Assessment - 03/08/15 1551    Subjective Pt is having a lot of pain the last few days.  I think I am flaring but no real reason for it.  she called Dr. Margaretha Sheffield.  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   Limitations House hold activities;Lifting  lfting above your head   Diagnostic tests XR   Patient Stated Goals To have relief of this pain.   Currently in Pain? Yes   Pain Score 8    Pain Location --  scapula   Pain Orientation Left   Pain Descriptors / Indicators Aching   Pain Type Chronic pain   Pain Onset More than a month ago   Pain Frequency Intermittent                       [facility] Adult PT Treatment/Exercise - 03/08/15 1556    Posture/Postural Control   Posture/Postural Control Postural limitations   Postural Limitations Forward head;Rounded Shoulders;Anterior pelvic tilt   Posture Comments Pt briefly reviewed sitting and standing posture   Modalities   Modalities Moist Heat   Moist Heat Therapy   Number Minutes Moist Heat 15 Minutes   Moist Heat Location Other (comment)  upper back and neck in prone   Manual Therapy   Manual Therapy Joint mobilization;Myofascial release   Joint Mobilization Thoracic pa mobs of T-3 to T-9 and costotransverse of T 4 to T 6,  lateral pa of cervical C-4 andto C 6 from left    Myofascial Release Upper trap and levator bil    Neck Exercises: Stretches   Upper Trapezius Stretch 2 reps;30 seconds   Levator Stretch 2 reps;30 seconds   Neck Stretch --  chin tuck    Myofascial for bilateral muscles above      Trigger Point Dry Needling - 03/08/15 1559    Consent Given? Yes   Education Handout Provided Yes   Muscles Treated Upper Body Upper trapezius;Levator scapulae;Rhomboids;Subscapularis;Infraspinatus   Upper Trapezius Response Twitch reponse elicited;Palpable increased muscle length  left side only for TDN   Levator Scapulae  Response Twitch response elicited;Palpable increased muscle length   Rhomboids Response Twitch response elicited;Palpable increased muscle length   Subscapularis Response Twitch response elicited;Palpable increased muscle length     only on left side only for dry needling         PT Education - 03/08/15 1626    Education provided Yes   Education Details Pt provided with dry needling precautian and aftercare.  Pt also reviewed and had pt demo proper sitting and standing posture and reviewed Trap and levator stretch   Person(s) Educated Patient   Methods Explanation;Handout   Comprehension Verbalized understanding;Returned demonstration;Verbal cues required             PT Long Term Goals - 03/08/15 1625    PT LONG TERM GOAL #1   Title Pt will be I with HEP for  posture and UE strength   Time 6   Period Weeks   Status On-going   PT LONG TERM GOAL #2   Title Pt will be able to work a full shift and report pain </=3/10 most of the time   Baseline Pt flared with 8/10 pain over the weekend   Time 6   Period Weeks   Status On-going   PT LONG TERM GOAL #3   Title Pt will have no pain at rest 75% of the time when not working.    Time 6   Period Weeks   Status On-going   PT LONG TERM GOAL #4   Title Pt will demo good posture and lifting techniques to prevent re-injury.    Time 6   Period Weeks   Status On-going               Plan - 03/08/15 1628    Clinical Impression Statement Pt with initially scapular/left neck pain in spasm 8/10 level.  Pt recieved almost immediate relief pain and certain palpable muscle lengthening post needing. of left upper trap/subscapularis.  Pt with no goals completed but making progress towards goals for initial HEP and posture.  will continue to give strengthening exercises and  pain control   Pt will benefit from skilled therapeutic intervention in order to improve on the following deficits Increased muscle spasms   Rehab Potential Good   PT Frequency 2x / week   PT Duration 6 weeks   PT Treatment/Interventions ADLs/Self Care Home Management;Therapeutic exercise;Patient/family education;Moist Heat;Neuromuscular re-education;Manual techniques;Cryotherapy;Ultrasound;Dry needling   PT Next Visit Plan Assess dry needling and assess goals nexte visit   PT Home Exercise Plan trigger point dry needling and reviewed posture   Consulted and Agree with Plan of Care Patient        Problem List Patient Active Problem List   Diagnosis Date Noted  . Periscapular pain 01/19/2015  . Left-sided thoracic back pain-greater than 20 years duration 01/12/2015  . GERD (gastroesophageal reflux disease) 08/22/2013  . Asthma exacerbation 06/13/2012  . 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 Abnormal ECG- T   ST changes in V3 through V5 05/02/2011    Garen LahLawrie Beardsley, PT 03/08/2015 5:24 PM Phone: [phone] Fax: [phone]  [facility] [address] [place], [place], [address] Phone: [phone]   Fax:  [phone]

## 2015-03-08 NOTE — Patient Instructions (Signed)
Trigger Point Dry Needling  . What is Trigger Point Dry Needling (DN)? o DN is a physical therapy technique used to treat muscle pain and dysfunction. Specifically, DN helps deactivate muscle trigger points (muscle knots).  o A thin filiform needle is used to penetrate the skin and stimulate the underlying trigger point. The goal is for a local twitch response (LTR) to occur and for the trigger point to relax. No medication of any kind is injected during the procedure.   . What Does Trigger Point Dry Needling Feel Like?  o The procedure feels different for each individual patient. Some patients report that they do not actually feel the needle enter the skin and overall the process is not painful. Very mild bleeding may occur. However, many patients feel a deep cramping in the muscle in which the needle was inserted. This is the local twitch response.   Marland Kitchen. How Will I feel after the treatment? o Soreness is normal, and the onset of soreness may not occur for a few hours. Typically this soreness does not last longer than two days.  o Bruising is uncommon, however; ice can be used to decrease any possible bruising.  o In rare cases feeling tired or nauseous after the treatment is normal. In addition, your symptoms may get worse before they get better, this period will typically not last longer than 24 hours.   . What Can I do After My Treatment? o Increase your hydration by drinking more water for the next 24 hours. o You may place ice or heat on the areas treated that have become sore, however, do not use heat on inflamed or bruised areas. Heat often brings more relief post needling. o You can continue your regular activities, but vigorous activity is not recommended initially after the treatment for 24 hours. o DN is best combined with other physical therapy such as strengthening, stretching, and other therapies.  Garen LahLawrie Beardsley, PT 03/08/2015 4:01 PM Phone: [phone] Fax: [phone]

## 2015-03-09 ENCOUNTER — Telehealth: Payer: Self-pay | Admitting: Pulmonary Disease

## 2015-03-09 NOTE — Telephone Encounter (Signed)
I have not seen any papers on this patient for Xolair;I do recall speaking with patient at her 02-04-15 visit. 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 I spoke with patient who was very understanding of the situation and is more than willing to come by the office one day next week to meet with me personally and sign the paperwork. I will process this the same day.

## 2015-03-09 NOTE — Telephone Encounter (Signed)
Will route message to Florentina AddisonKatie since this is in regards to Xolair.

## 2015-03-16 ENCOUNTER — Encounter: Payer: 59 | Admitting: Physical Therapy

## 2015-03-17 ENCOUNTER — Ambulatory Visit: Payer: 59 | Admitting: Physical Therapy

## 2015-03-17 ENCOUNTER — Telehealth: Payer: Self-pay | Admitting: Pulmonary Disease

## 2015-03-17 DIAGNOSIS — R293 Abnormal posture: Secondary | ICD-10-CM

## 2015-03-17 DIAGNOSIS — M62838 Other muscle spasm: Secondary | ICD-10-CM

## 2015-03-17 DIAGNOSIS — M6281 Muscle weakness (generalized): Secondary | ICD-10-CM

## 2015-03-17 NOTE — Patient Instructions (Addendum)
ROM: Towel Stretch - with Interior Rotation   Pull left arm up behind back by pulling towel up with other arm. Hold 30____ seconds. Repeat _3___ times per set. Do ___1_ sets per session. Do _1-2___ sessions per day.  http://orth.exer.us/889   Copyright  VHI. All rights reserved.  Sleeper's stretch 3 reps 30 seconds hold

## 2015-03-17 NOTE — Telephone Encounter (Signed)
Spoke with patient-she is aware I have received approval from [employer] for patient to get medication through [facility]. The only information I have not received is the cost for patient on needle injection. Pt is aware that I will need to seek this information through [employer]. Will keep her updated as I receive new information.   After speaking with [employer]-I will have to contact [employer] to seek coverage/co-pay cost for patient for admin fee. I will contact [employer] this week.

## 2015-03-17 NOTE — Therapy (Addendum)
[place], [place], [address] Phone: [phone]   Fax:  [phone]  Physical Therapy Treatment/Discharge Note  Patient Details  Name: Kelsey [name] MRN: [id number] Date of Birth: 05-Aug-1981 Referring Provider:  Seward Carol, MD  Encounter Date: 03/17/2015      PT End of Session - 03/17/15 1347    Visit Number 5   Number of Visits 12   Date for PT Re-Evaluation 03/17/15   PT Start Time 0930   PT Stop Time 1030   PT Time Calculation (min) 60 min      Past Medical History  Diagnosis Date  . PVC (premature ventricular contraction)   . Palpitations   . Pre-syncope   . Tachycardia   . Depression   . Insomnia   . Seasonal allergies   . Asthma   . Anxiety   . Depression   . Insomnia   . GERD (gastroesophageal reflux disease)     Past Surgical History  Procedure Laterality Date  . Cesarean section  10/15/2009, 02/08/2006    times 2    There were no vitals filed for this visit.  Visit Diagnosis:  Abnormal posture  Muscle weakness of left upper extremity  Muscle spasm      Subjective Assessment - 03/17/15 0925    Subjective Dry needeling woeked and was pain free a whole week.  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 Now 2/20.   Currently in Pain? Yes   Pain Score 2    Pain Location Back   Pain Orientation Left;Upper   Pain Descriptors / Indicators Sore   Pain Frequency Intermittent   Aggravating Factors  moving, using arms body a lot to move.   Pain Relieving Factors Dry needeling   Multiple Pain Sites No                         [facility] Adult PT Treatment/Exercise - 03/17/15 0940    Shoulder Exercises: Supine   Theraband Level (Shoulder External Rotation) Level 3 (Green)  20 reps   Other Supine Exercises supine punch 10 LBS 20 reps   Shoulder Exercises: Prone   Extension 10 reps  5 LBS both  elbows flexed like a row   Horizontal ABduction 1 Limitations Thumbs forward 10 reps,    Other Prone Exercises "Y" 10 reps with instruction for head position.   Shoulder Exercises: Standing   Shoulder Elevation Limitations shoulder shrugs, 10 LBS each hand    Other Standing Exercises scaption to 90 degrees at 45 degree angle ,3 LBS barbells   Shoulder Exercises: Stretch   Corner Stretch 3 reps;30 seconds  doorway   Other Shoulder Stretches sleeper's stretch   Other Shoulder Stretches Sleeper stretch and Towel stretches added to home exercises, 3 reps 10 reps each   Electrical Stimulation   Electrical Stimulation Location --  scapula   Electrical Stimulation Action IFC   Electrical Stimulation Parameters to tolerance   Electrical Stimulation Goals Pain                PT Education - 03/17/15 1347    Education provided Yes   Education Details stretches   Person(s) Educated Patient   Methods Explanation;Demonstration;Handout   Comprehension Verbalized understanding;Returned demonstration             PT Long Term Goals - 03/17/15 1352    PT LONG TERM GOAL #1  Title Pt will be I with HEP for posture and UE strength   Time 6   Period Weeks   Status On-going   PT LONG TERM GOAL #2   Title Pt will be able to work a full shift and report pain </=3/10 most of the time   Baseline 6/10 yesterday, no pain at all on Monday   Time 6   Period Weeks   Status On-going   PT LONG TERM GOAL #3   Title Pt will have no pain at rest 75% of the time when not working.    Baseline intermittant   Time 6   Period Weeks   Status On-going   PT LONG TERM GOAL #4   Title Pt will demo good posture and lifting techniques to prevent re-injury.    Time 6   Period Weeks   Status Achieved               Plan - 03/17/15 1350    Clinical Impression Statement Pain much improved with dry needeling pain free for a week, a little flare with a day of heavy lifting at work moving into a new department.  Workong  toward home exercise goals.   PT Next Visit Plan scapular stabilization   Consulted and Agree with Plan of Care Patient        Problem List Patient Active Problem List   Diagnosis Date Noted  . Periscapular pain 01/19/2015  . Left-sided thoracic back pain-greater than 20 years duration 01/12/2015  . GERD (gastroesophageal reflux disease) 08/22/2013  . Asthma exacerbation 06/13/2012  . 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 Abnormal ECG- T  ST changes in V3 through V5 05/02/2011    [facility] 03/17/2015, 1:54 PM  [facility] [address] [place], [place], [address] Phone: [phone]   Fax:  [phone]    Melvenia Needles, PTA 03/17/2015 1:54 PM Phone: [phone] Fax: [phone]  PHYSICAL THERAPY DISCHARGE SUMMARY  Visits from Start of Care: 5  Current functional level related to goals / functional outcomes: unknown   Remaining deficits: unknown   Education / Equipment: HEP Plan: Patient agrees to discharge.  Patient goals were partially met. Patient is being discharged due to not returning since the last visit.  ?????    Voncille Lo, PT Exercise Expert for the Aging Adult  11/09/16 3:58 PM Phone: [phone] Fax: [phone]

## 2015-03-23 ENCOUNTER — Encounter: Payer: 59 | Admitting: Physical Therapy

## 2015-03-23 ENCOUNTER — Encounter: Payer: Self-pay | Admitting: Sports Medicine

## 2015-03-23 ENCOUNTER — Ambulatory Visit (INDEPENDENT_AMBULATORY_CARE_PROVIDER_SITE_OTHER): Payer: 59 | Admitting: Sports Medicine

## 2015-03-23 VITALS — BP 114/82 | Ht 64.0 in | Wt 160.0 lb

## 2015-03-23 DIAGNOSIS — G2589 Other specified extrapyramidal and movement disorders: Secondary | ICD-10-CM

## 2015-03-23 NOTE — Progress Notes (Signed)
   Subjective:    Patient ID: Kelsey [name], female    DOB: [birth year], 34 y.o.   MRN: [id number]  HPI   Patient comes in today for follow-up on left scapular pain. Overall she feels like she has improved by about 70%. She has been working weekly with physical therapy. She is getting much less popping and burning around her scapula. She still denies pain elsewhere in the shoulder. She denies any associated numbness or tingling.    Review of Systems     Objective:   Physical Exam  Well-developed, well-nourished. No acute distress  Left shoulder: Full painless range of motion. Good rotator cuff strength. No tenderness to palpation. No signs of rotator cuff impingement. Evaluation of her scapula shows good mobility without any appreciable catching or popping. There is no tenderness to palpation around the scapula or the parascapular musculature.      Assessment & Plan:  Improving left shoulder scapular dyskinesis  Patient is making good progress with physical therapy. I recommended that she continue working with physical therapy until they are ready to discharge her to home exercise program. I've also explained the importance of continuing with a lifelong scapular stabilization program at home to help prevent any reoccurrence of symptoms. She may continue with activity as tolerated and will follow-up with me as needed.

## 2015-03-23 NOTE — Telephone Encounter (Signed)
Katie, please advise if you have spoken to [facility]. Thanks.

## 2015-03-24 ENCOUNTER — Telehealth: Payer: Self-pay | Admitting: Pulmonary Disease

## 2015-03-24 NOTE — Telephone Encounter (Signed)
Will [facility] as she stated for pt to speak with her directly

## 2015-03-24 NOTE — Telephone Encounter (Signed)
I spoke with [employer]-injections are showing coverage at 100% of cost-however the injections are subject to a $40 co-pay each visit. We would not know this until [facility].    I have left a message for the patient to call and speak with me directly.

## 2015-03-24 NOTE — Telephone Encounter (Signed)
Please see 03-17-15 phone note for information on this matter. Thanks.

## 2015-03-24 NOTE — Telephone Encounter (Signed)
Pt returning call to Florentina AddisonKatie  [phone] pt can be reached @.Caren GriffinsStanley A Dalton

## 2015-03-24 NOTE — Telephone Encounter (Signed)
Pt calling to follow up on PA for Xolair - pt reports this was started x 1 week ago.  [facility] to [facility] on.

## 2015-03-25 MED ORDER — EPINEPHRINE 0.3 MG/0.3ML IJ SOAJ: 0.3000 mg | Freq: Once | INTRAMUSCULAR | Status: DC

## 2015-03-25 MED ORDER — OMALIZUMAB 150 MG ~~LOC~~ SOLR: 225.0000 mg | SUBCUTANEOUS | Status: DC

## 2015-03-25 NOTE — Telephone Encounter (Signed)
Pt is aware of cost per Insurance for injecting Xolair. She has agreed to start Xolair. Pt aware of 2 hour wait time at first injections, bring epi-pen with her to visit(sent to pharmacy). Pt understands to go by [facility] to pick up Xolair Rx (30 day supply) and her Epipen Rx. Appt made for Monday 03-29-15 at 11:00am. All information has been relayed and given to Allergy lab for new start. Nothing more needed at this time.

## 2015-03-29 ENCOUNTER — Ambulatory Visit (INDEPENDENT_AMBULATORY_CARE_PROVIDER_SITE_OTHER): Payer: 59

## 2015-03-29 DIAGNOSIS — J4520 Mild intermittent asthma, uncomplicated: Secondary | ICD-10-CM | POA: Diagnosis not present

## 2015-03-30 MED ORDER — OMALIZUMAB 150 MG ~~LOC~~ SOLR
225.0000 mg | Freq: Once | SUBCUTANEOUS | Status: AC
  Administered 2015-03-29: 225 mg via SUBCUTANEOUS

## 2015-04-01 ENCOUNTER — Telehealth: Payer: Self-pay | Admitting: Pulmonary Disease

## 2015-04-01 MED ORDER — AZITHROMYCIN 250 MG PO TABS: ORAL_TABLET | ORAL | Status: DC

## 2015-04-01 NOTE — Telephone Encounter (Signed)
lmomtcb x1 

## 2015-04-01 NOTE — Telephone Encounter (Signed)
Spoke with pt. She c/o prod cough (yellow phlem), wheezing at night x couple days d/t allergies. No PND. Requesting ABX. Please advise RA thanks  Allergies  Allergen Reactions  . Penicillins Rash

## 2015-04-01 NOTE — Telephone Encounter (Signed)
Pt returned call. Informed her of the recs per RA. Rx sent to preferred pharmacy. Pt verbalized understanding and denied any further questions or concerns at this time.

## 2015-04-01 NOTE — Telephone Encounter (Signed)
z-pak 

## 2015-04-07 ENCOUNTER — Encounter: Payer: Self-pay | Admitting: Adult Health

## 2015-04-07 ENCOUNTER — Ambulatory Visit (INDEPENDENT_AMBULATORY_CARE_PROVIDER_SITE_OTHER)
Admission: RE | Admit: 2015-04-07 | Discharge: 2015-04-07 | Disposition: A | Discharge status: Home / Self Care | Payer: 59 | Source: Ambulatory Visit | Attending: Adult Health | Admitting: Adult Health

## 2015-04-07 ENCOUNTER — Encounter (INDEPENDENT_AMBULATORY_CARE_PROVIDER_SITE_OTHER): Payer: Self-pay

## 2015-04-07 ENCOUNTER — Ambulatory Visit: Payer: 59 | Admitting: Pulmonary Disease

## 2015-04-07 ENCOUNTER — Ambulatory Visit (INDEPENDENT_AMBULATORY_CARE_PROVIDER_SITE_OTHER): Payer: 59 | Admitting: Adult Health

## 2015-04-07 VITALS — BP 124/80 | HR 104 | Temp 97.4°F | Ht 64.0 in | Wt 169.0 lb

## 2015-04-07 DIAGNOSIS — R042 Hemoptysis: Secondary | ICD-10-CM

## 2015-04-07 DIAGNOSIS — J45901 Unspecified asthma with (acute) exacerbation: Secondary | ICD-10-CM

## 2015-04-07 MED ORDER — PREDNISONE 10 MG PO TABS: ORAL_TABLET | ORAL | Status: DC

## 2015-04-07 MED ORDER — HYDROCODONE-HOMATROPINE 5-1.5 MG/5ML PO SYRP: 5.0000 mL | ORAL_SOLUTION | Freq: Four times a day (QID) | ORAL | Status: DC | PRN

## 2015-04-07 NOTE — Patient Instructions (Signed)
Mucinex twice daily as needed. For cough, congestion. Delsym 2 tsp  Twice daily  As needed  Cough.  Prednisone taper over next week.  Hydromet 1-2 teaspoons every 4-6 hours as needed. For cough, may make you sleepy Followup Dr. Vassie LollAlva in  6 weeks and as needed. Please contact office for sooner follow up if symptoms do not improve or worsen or seek emergency care

## 2015-04-07 NOTE — Progress Notes (Signed)
Reviewed & agree with plan  

## 2015-04-07 NOTE — Addendum Note (Signed)
Addended by: Boone MasterJONES, Pryce Folts E on: 04/07/2015 03:31 PM   Modules accepted: Orders, Medications

## 2015-04-07 NOTE — Progress Notes (Signed)
Subjective:    Patient ID: Kelsey [name], female    DOB: [birth year], 34 y.o.   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   She had frequent flares in 2013, stable x 1 yr, & again flares since 06/2013.  Asthma triggers include heat humidity allergies and cats  Environment - she has lived in the same house since 2009. She has a dog since 2004. She has perennial allergies and took allergy shots until age 34. Her symptoms are not worse at work and she does not describe any triggers at work.   Significant tests/ events  Of note she had a rheumatological evaluation by Dr. Nickola MajorHawkes, ANA, RA factor was negative sedimentation rate was 3, CPK was 44, thyroid function was normal  RAST 2013 - High titers to cat & dog dander, grass & house dust  PFTs - November 2013 showed FEV1 of 1.87-59% improving to 2.24-70% with bronchodilator.   Sep 2014 severe asthma exacerbation requiring hospitalization. Due to her frequent symptoms -extended pred taper until jan 2015   02/04/2015  Chief Complaint  Patient presents with  . Follow-up    Pt has no breathing complaints at this time.  Pt has had several flare-ups in the past 6 months.    Several flareups in 2015, most recent one in 12/2014 She is now improved after prednisone taper. No obvious environmental or occupational triggers. She has a dog for 10 years Of note,rast in 2013 showed high IgE >start Xolair   04/07/2015 Acute OV : Frequent asthma exacerbations  Pt presents for an acute office visit.  Complains of 1 day of increased cough and tightness.  Went on field trip yesterday with school was outside in heat. Last night started having increased cough and tightness. Cough was very violent. This am coughed up blood tinged mucus. No fever , chest pain, orthopnea, edema , calf pain, sinus congestion. No discolored mucus.  On BCP , CT angio chest neg for PE. In Dec 2015    CXR today clear.  Feels tight. Took albuterol treatment 2 hrs ago, that helped some.  Gets nervous easily .  Started on Xolair last week . -first dose  On Symbicort and QVAR .     Past Medical History  Diagnosis Date  . PVC (premature ventricular contraction)   . Palpitations   . Pre-syncope   . Tachycardia   . Depression   . Insomnia   . Seasonal allergies   . Asthma   . Anxiety   . Depression   . Insomnia   . GERD (gastroesophageal reflux disease)      Review of Systems neg for any significant sore throat, dysphagia, itching, sneezing, nasal congestion or excess/ purulent secretions, fever, chills, sweats, unintended wt loss, pleuritic or exertional cp, hempoptysis, orthopnea pnd or change in chronic leg swelling. Also denies presyncope, palpitations, heartburn, abdominal pain, nausea, vomiting, diarrhea or change in bowel or urinary habits, dysuria,hematuria, rash, arthralgias, visual complaints, headache, numbness weakness or ataxia.     Objective:   Physical Exam  Gen. Pleasant, well-nourished, in no distress ENT - no lesions, no post nasal drip Neck: No JVD, no thyromegaly, no carotid bruits Lungs: no use of accessory muscles, no dullness to percussion, clear without rales or rhonchi , no wheezing , good aeration, no stridor.  Cardiovascular: Rhythm regular, heart sounds  normal, no murmurs or gallops, no peripheral edema, neg calf pain,  neg homans sign  Musculoskeletal: No deformities, no cyanosis or clubbing    CXR. 04/07/2015 >nad  Reviewed independently      Assessment & Plan:

## 2015-04-07 NOTE — Assessment & Plan Note (Signed)
Exacerbation -frequent flare  Now on Xolair -hope to help with frequent steroid use.  cxr w/ nad  Hold on abx at this time    Plan  Mucinex twice daily as needed. For cough, congestion. Delsym 2 tsp  Twice daily  As needed  Cough.  Prednisone taper over next week.  Hydromet 1-2 teaspoons every 4-6 hours as needed. For cough, may make you sleepy Followup Kelsey [name] in  6 weeks and as needed. Please contact office for sooner follow up if symptoms do not improve or worsen or seek emergency care

## 2015-04-12 ENCOUNTER — Ambulatory Visit: Payer: 59

## 2015-04-14 ENCOUNTER — Ambulatory Visit (INDEPENDENT_AMBULATORY_CARE_PROVIDER_SITE_OTHER): Payer: 59

## 2015-04-14 DIAGNOSIS — J4520 Mild intermittent asthma, uncomplicated: Secondary | ICD-10-CM | POA: Diagnosis not present

## 2015-04-15 MED ORDER — OMALIZUMAB 150 MG ~~LOC~~ SOLR
225.0000 mg | Freq: Once | SUBCUTANEOUS | Status: AC
Start: 2015-04-14 — End: 2015-04-15
  Administered 2015-04-15: 225 mg via SUBCUTANEOUS

## 2015-04-19 ENCOUNTER — Telehealth: Payer: Self-pay | Admitting: Internal Medicine

## 2015-04-19 NOTE — Telephone Encounter (Signed)
Spoke with patient-has questions about billing of injection(s) for us. Pt is aware that I will speak with Chantel in the morning.

## 2015-04-19 NOTE — Telephone Encounter (Signed)
LMTCB-will need to speak with Kelsey [name] only.

## 2015-04-19 NOTE — Telephone Encounter (Signed)
Pt returning call - [phone]

## 2015-04-20 NOTE — Telephone Encounter (Signed)
After speaking with Chantel this morning; looks as though the bill the patient received is her co-insurance portion for injections of Xolair.  I spoke with patient-she states that the cost for injection(s) is not something she can continue to pay out of pocket and therefore wants to stop Xolair. Pt is aware that I will send message to RA as FYI and to state he is aware and okay with patient stopping Xolair.

## 2015-04-20 NOTE — Telephone Encounter (Signed)
Unfortunate, but can understand

## 2015-04-28 ENCOUNTER — Ambulatory Visit: Payer: 59

## 2015-05-17 ENCOUNTER — Telehealth: Payer: Self-pay | Admitting: Pulmonary Disease

## 2015-05-17 NOTE — Telephone Encounter (Signed)
These are in RA's look at.  Called and LMTCB x1 for pt to make are

## 2015-05-17 NOTE — Telephone Encounter (Signed)
Spoke with pt, advised that her forms are in RA's inbasket and will be addressed at his return to the office this afternoon. Forwarding to RA's CMA to follow up on.

## 2015-05-19 NOTE — Telephone Encounter (Signed)
Paper work has been submitted to [employer].   Patient notified. Nothing further needed.

## 2015-05-21 ENCOUNTER — Telehealth: Payer: Self-pay | Admitting: Pulmonary Disease

## 2015-05-24 NOTE — Telephone Encounter (Signed)
error 

## 2015-06-11 ENCOUNTER — Ambulatory Visit (INDEPENDENT_AMBULATORY_CARE_PROVIDER_SITE_OTHER): Payer: 59 | Admitting: Pulmonary Disease

## 2015-06-11 ENCOUNTER — Encounter: Payer: Self-pay | Admitting: Pulmonary Disease

## 2015-06-11 VITALS — BP 130/82 | HR 92 | Ht 64.0 in | Wt 168.6 lb

## 2015-06-11 DIAGNOSIS — J45901 Unspecified asthma with (acute) exacerbation: Secondary | ICD-10-CM | POA: Diagnosis not present

## 2015-06-11 DIAGNOSIS — K219 Gastro-esophageal reflux disease without esophagitis: Secondary | ICD-10-CM

## 2015-06-11 MED ORDER — PREDNISONE 10 MG PO TABS: ORAL_TABLET | ORAL | Status: DC

## 2015-06-11 NOTE — Assessment & Plan Note (Signed)
We discussed prednisone self taper Prednisone 10 mg tabs  Take 2 tabs daily with food x 5ds, then 1 tab daily with food x 5ds then STOP -call us when you fill this Rx We will refer you for research study

## 2015-06-11 NOTE — Patient Instructions (Signed)
We discussed prednisone self taper Prednisone 10 mg tabs  Take 2 tabs daily with food x 5ds, then 1 tab daily with food x 5ds then STOP -call us when you fill this Rx We will refer you for research study 

## 2015-06-11 NOTE — Progress Notes (Signed)
   Subjective:    Patient ID: Kelsey NoseJennifer L Keen, female    DOB: 1980/12/04, 34 y.o.   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   She had frequent flares in 2013, stable x 1 yr, & again flares since 06/2013.  Asthma triggers include heat humidity allergies and cats  Environment - she has lived in the same house since 2009. She has a dog since 2004. She has perennial allergies and took allergy shots until age [age]. Her symptoms are not worse at work and she does not describe any triggers at work.    Significant tests/ events  Of note she had a rheumatological evaluation by Dr. Nickola MajorHawkes, ANA, RA factor was negative sedimentation rate was 3, CPK was 44, thyroid function was normal  RAST 2013 - High titers to cat & dog dander, grass & house dust , high IgE PFTs - 11/ 2013 showed FEV1 of 1.87-59% improving to 2.24-70% with bronchodilator.   Sep 2014 severe asthma exacerbation requiring hospitalization. Due to her frequent symptoms -extended pred taper until jan 2015   06/11/2015  Chief Complaint  Patient presents with  . Follow-up    Pt states Asthma is doing well. No concerns at this time.    Several flareups in 2015, 3 in 2016 most recent one in 03/2015 >Took Xolair x 2 doses , top due to cost issues Compliant with symbicort and QVAR .  reviewed environmental history , nasal symptoms , reflux appears controlled     Review of Systems neg for any significant sore throat, dysphagia, itching, sneezing, nasal congestion or excess/ purulent secretions, fever, chills, sweats, unintended wt loss, pleuritic or exertional cp, hempoptysis, orthopnea pnd or change in chronic leg swelling. Also denies presyncope, palpitations, heartburn, abdominal pain, nausea, vomiting, diarrhea or change in bowel or urinary habits, dysuria,hematuria, rash, arthralgias, visual complaints, headache, numbness weakness or ataxia.       Objective:   Physical Exam  Gen. Pleasant, well-nourished, in no distress ENT - no lesions, no post nasal drip Neck: No JVD, no thyromegaly, no carotid bruits Lungs: no use of accessory muscles, no dullness to percussion, clear without rales or rhonchi  Cardiovascular: Rhythm regular, heart sounds  normal, no murmurs or gallops, no peripheral edema Musculoskeletal: No deformities, no cyanosis or clubbing        Assessment & Plan:

## 2015-06-11 NOTE — Assessment & Plan Note (Signed)
Controlled on nexium.  

## 2015-06-29 ENCOUNTER — Telehealth: Payer: Self-pay | Admitting: Pulmonary Disease

## 2015-06-29 MED ORDER — AZITHROMYCIN 250 MG PO TABS: ORAL_TABLET | ORAL | Status: DC

## 2015-06-29 MED ORDER — HYDROCODONE-HOMATROPINE 5-1.5 MG/5ML PO SYRP: 5.0000 mL | ORAL_SOLUTION | Freq: Four times a day (QID) | ORAL | Status: DC | PRN

## 2015-06-29 NOTE — Telephone Encounter (Signed)
Spoke with the pt  She is c/o increased SOB and cough with yellow sputum x 2 days  She denies any f/c/s, CP, chest tightness, wheezing or other co's  She started pred taper that Dr Vassie Loll gave her hold onto if needed, but she feels she needs abx  Will forward to TP since RA not on the scheduled today  Please advise thanks! Allergies  Allergen Reactions  . Penicillins Rash

## 2015-06-29 NOTE — Telephone Encounter (Signed)
Per TP ok for hydromet # 240 ml 1 tsp every 6 hours prn cough  I have printed rx and placed up front for pick up  Pt aware and nothing further needed

## 2015-06-29 NOTE — Telephone Encounter (Signed)
zpack # 1 tad  No refills.  Please contact office for sooner follow up if symptoms do not improve or worsen or seek emergency care  Keep follow up

## 2015-06-29 NOTE — Telephone Encounter (Signed)
Pt returned call  Pt hung up before i could get a cb number

## 2015-06-29 NOTE — Telephone Encounter (Signed)
Spoke with the pt and notified of recs per TP  She verbalized understanding  She states she is taking delsym and it is not helping to suppress her cough  She is requesting stronger cough med She has taken hydromet and tussionex in the past  Please advise thanks!

## 2015-07-05 IMAGING — CR DG CHEST 2V
2 series · 2 of 2 positions shown · non-contrast
Comparison: CT 10/31/2014.  Chest x-ray 10/31/2015, 09/15/2013.

CLINICAL DATA: Asthma.  Cough and congestion.

EXAM:
CHEST  2 VIEW

[view not recorded (1 of 2)]
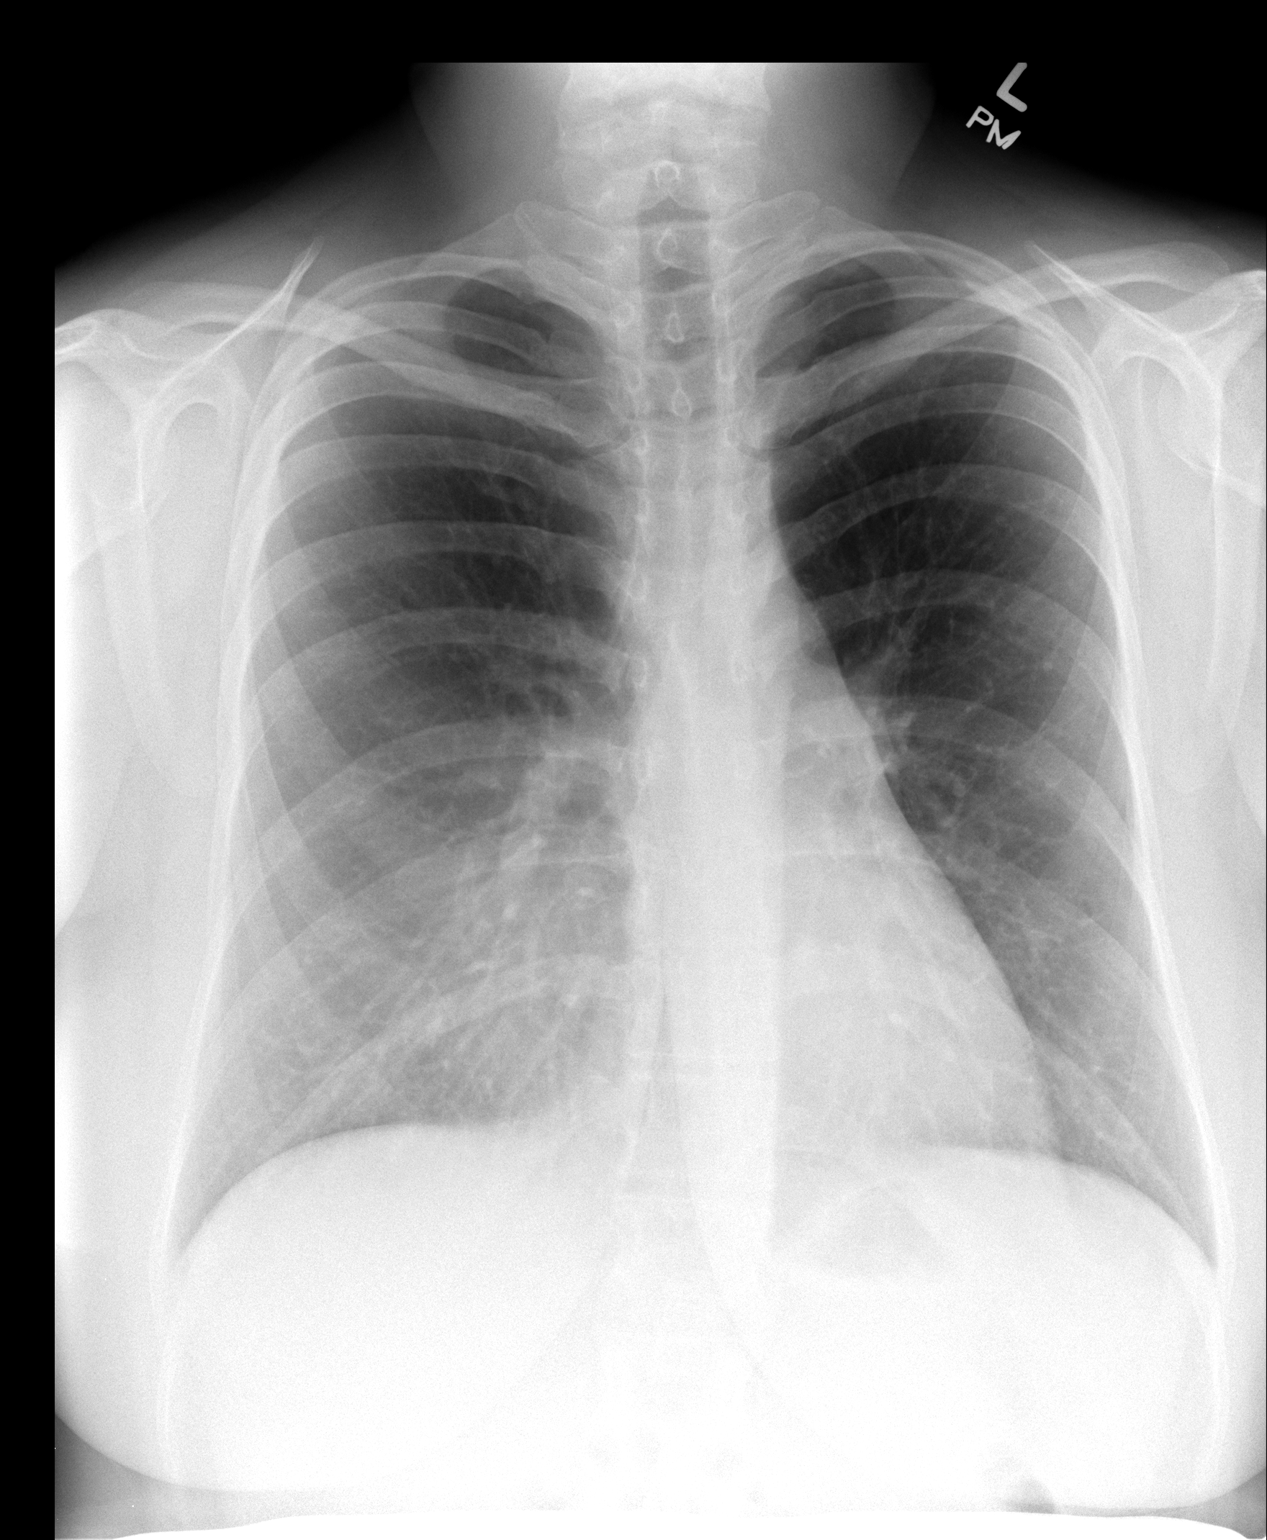

[view not recorded (2 of 2)]
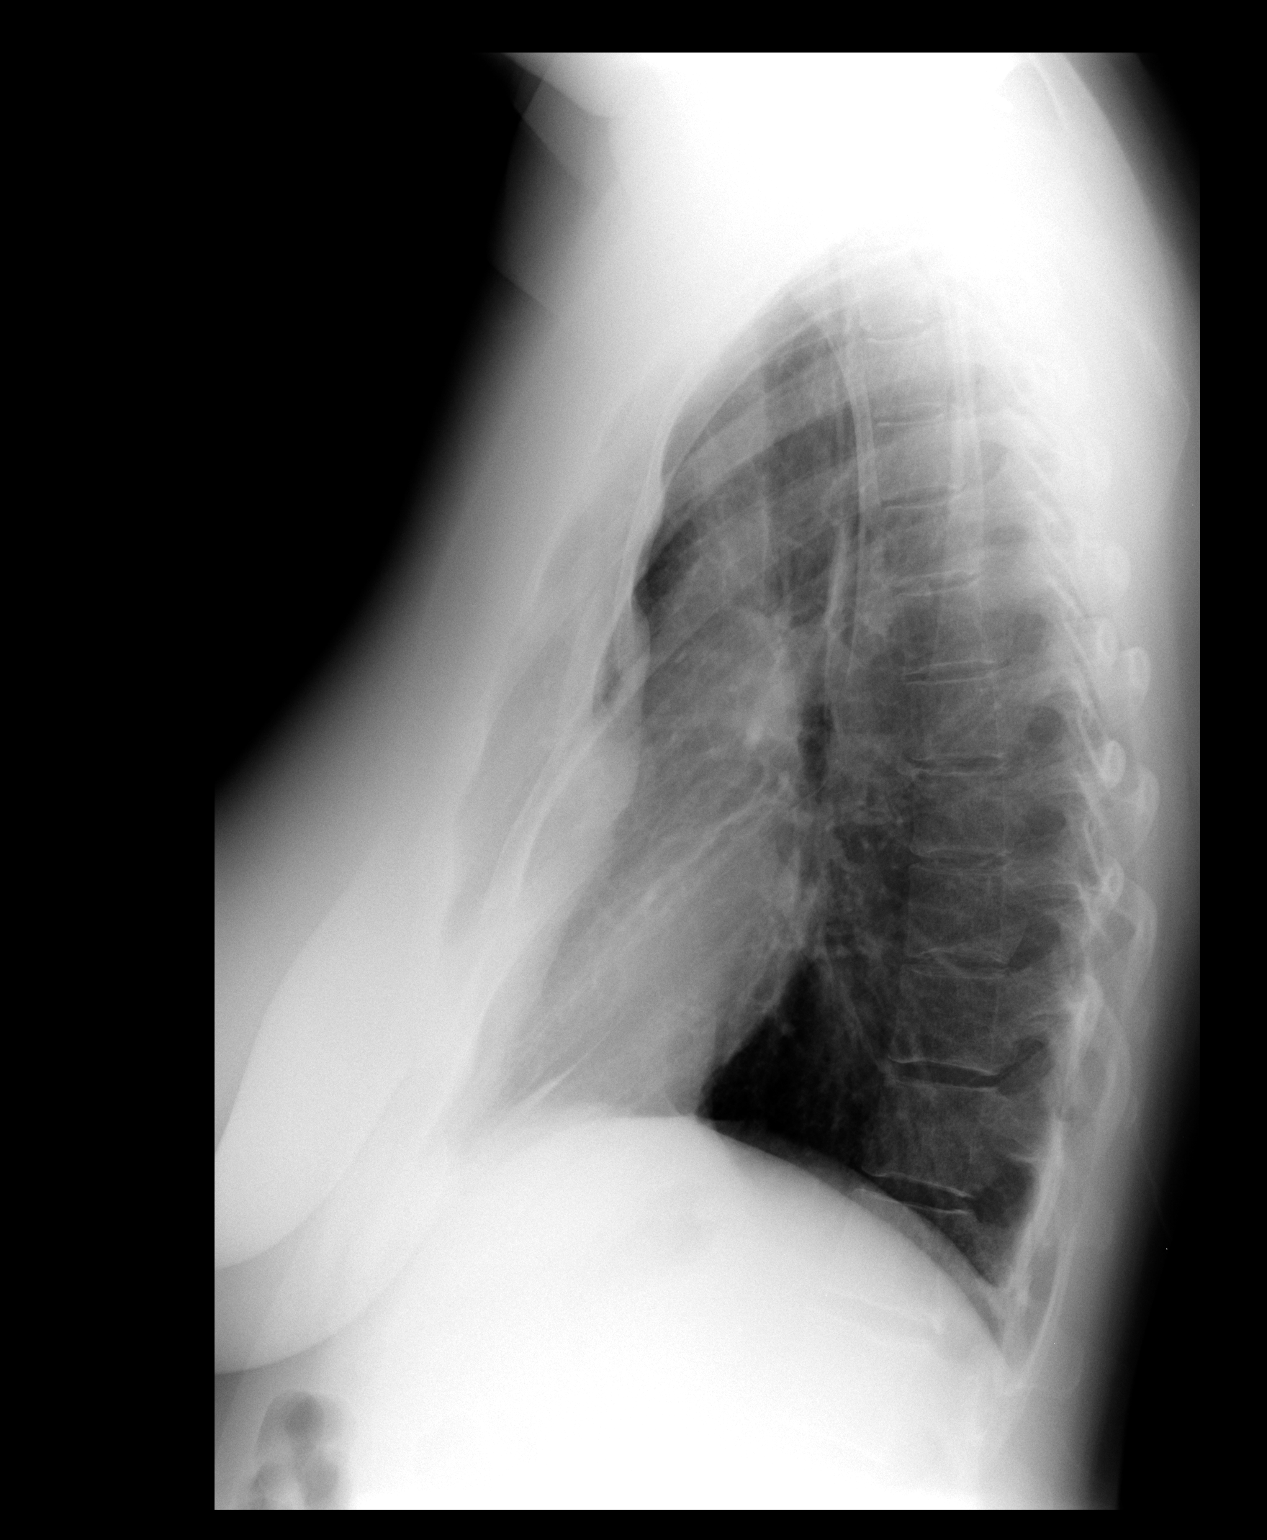

[2 of 2 positions shown; findings below may reference images not displayed]

FINDINGS: Mediastinum and hilar structures are normal. Pectus deformity noted.
Heart size normal. No focal infiltrate. No pleural effusion or
pneumothorax.
IMPRESSION: No acute cardiopulmonary disease. Pectus deformity noted. Chest is
stable from prior exams.

## 2015-07-12 ENCOUNTER — Ambulatory Visit (HOSPITAL_COMMUNITY)
Admission: RE | Admit: 2015-07-12 | Discharge: 2015-07-12 | Disposition: A | Discharge status: Home / Self Care | Payer: 59 | Attending: Psychiatry | Admitting: Psychiatry

## 2015-07-12 DIAGNOSIS — F410 Panic disorder [episodic paroxysmal anxiety] without agoraphobia: Secondary | ICD-10-CM | POA: Diagnosis not present

## 2015-07-12 DIAGNOSIS — F411 Generalized anxiety disorder: Secondary | ICD-10-CM | POA: Diagnosis not present

## 2015-07-12 DIAGNOSIS — F331 Major depressive disorder, recurrent, moderate: Secondary | ICD-10-CM | POA: Diagnosis present

## 2015-07-13 ENCOUNTER — Emergency Department (HOSPITAL_COMMUNITY)
Admission: EM | Admit: 2015-07-13 | Discharge: 2015-07-14 | Disposition: A | Discharge status: Other Acute Inpatient Hospital | Payer: 59 | Attending: Emergency Medicine | Admitting: Emergency Medicine

## 2015-07-13 ENCOUNTER — Encounter (HOSPITAL_COMMUNITY): Payer: Self-pay | Admitting: *Deleted

## 2015-07-13 ENCOUNTER — Ambulatory Visit (HOSPITAL_COMMUNITY)
Admission: RE | Admit: 2015-07-13 | Discharge: 2015-07-13 | Disposition: A | Discharge status: Home / Self Care | Payer: 59 | Source: Home / Self Care | Attending: Psychiatry | Admitting: Psychiatry

## 2015-07-13 DIAGNOSIS — F419 Anxiety disorder, unspecified: Secondary | ICD-10-CM | POA: Insufficient documentation

## 2015-07-13 DIAGNOSIS — F329 Major depressive disorder, single episode, unspecified: Secondary | ICD-10-CM | POA: Diagnosis not present

## 2015-07-13 DIAGNOSIS — F1010 Alcohol abuse, uncomplicated: Secondary | ICD-10-CM | POA: Diagnosis not present

## 2015-07-13 DIAGNOSIS — J45909 Unspecified asthma, uncomplicated: Secondary | ICD-10-CM | POA: Diagnosis not present

## 2015-07-13 DIAGNOSIS — I493 Ventricular premature depolarization: Secondary | ICD-10-CM | POA: Insufficient documentation

## 2015-07-13 DIAGNOSIS — F32A Depression, unspecified: Secondary | ICD-10-CM

## 2015-07-13 DIAGNOSIS — Z3202 Encounter for pregnancy test, result negative: Secondary | ICD-10-CM | POA: Insufficient documentation

## 2015-07-13 DIAGNOSIS — Z880 Allergy status to penicillin: Secondary | ICD-10-CM | POA: Insufficient documentation

## 2015-07-13 DIAGNOSIS — F1310 Sedative, hypnotic or anxiolytic abuse, uncomplicated: Secondary | ICD-10-CM | POA: Insufficient documentation

## 2015-07-13 DIAGNOSIS — F1110 Opioid abuse, uncomplicated: Secondary | ICD-10-CM | POA: Insufficient documentation

## 2015-07-13 DIAGNOSIS — Z79899 Other long term (current) drug therapy: Secondary | ICD-10-CM | POA: Diagnosis not present

## 2015-07-13 DIAGNOSIS — Z8669 Personal history of other diseases of the nervous system and sense organs: Secondary | ICD-10-CM | POA: Diagnosis not present

## 2015-07-13 DIAGNOSIS — Z7951 Long term (current) use of inhaled steroids: Secondary | ICD-10-CM | POA: Diagnosis not present

## 2015-07-13 DIAGNOSIS — K219 Gastro-esophageal reflux disease without esophagitis: Secondary | ICD-10-CM | POA: Insufficient documentation

## 2015-07-13 DIAGNOSIS — Z008 Encounter for other general examination: Secondary | ICD-10-CM | POA: Diagnosis present

## 2015-07-13 LAB — CBC
HEMATOCRIT: 46.4 % — AB (ref 36.0–46.0)
HEMOGLOBIN: 15.1 g/dL — AB (ref 12.0–15.0)
MCH: 31.1 pg (ref 26.0–34.0)
MCHC: 32.5 g/dL (ref 30.0–36.0)
MCV: 95.7 fL (ref 78.0–100.0)
Platelets: 354 10*3/uL (ref 150–400)
RBC: 4.85 MIL/uL (ref 3.87–5.11)
RDW: 13.6 % (ref 11.5–15.5)
WBC: 9.8 10*3/uL (ref 4.0–10.5)

## 2015-07-13 LAB — COMPREHENSIVE METABOLIC PANEL
ALBUMIN: 4.9 g/dL (ref 3.5–5.0)
ALT: 23 U/L (ref 14–54)
ANION GAP: 11 (ref 5–15)
AST: 28 U/L (ref 15–41)
Alkaline Phosphatase: 72 U/L (ref 38–126)
BUN: 15 mg/dL (ref 6–20)
CALCIUM: 9.6 mg/dL (ref 8.9–10.3)
CHLORIDE: 101 mmol/L (ref 101–111)
CO2: 27 mmol/L (ref 22–32)
Creatinine, Ser: 0.81 mg/dL (ref 0.44–1.00)
GFR calc non Af Amer: >60 mL/min (ref >60)
GLUCOSE: 95 mg/dL (ref 65–99)
POTASSIUM: 4.1 mmol/L (ref 3.5–5.1)
SODIUM: 139 mmol/L (ref 135–145)
Total Bilirubin: 0.9 mg/dL (ref 0.3–1.2)
Total Protein: 8 g/dL (ref 6.5–8.1)

## 2015-07-13 LAB — I-STAT BETA HCG BLOOD, ED (MC, WL, AP ONLY): I-stat hCG, quantitative: <5 m[IU]/mL (ref <5)

## 2015-07-13 LAB — ETHANOL: Alcohol, Ethyl (B): <5 mg/dL (ref <5)

## 2015-07-13 LAB — SALICYLATE LEVEL

## 2015-07-13 LAB — ACETAMINOPHEN LEVEL

## 2015-07-13 NOTE — BH Assessment (Addendum)
Tele Assessment Note   Kelsey [name] is an 34 y.o. married female whose husband brought her to [facility] as a Walk-In for Assessment of SI and alcohol abuse. Information for this assessment obtained from pt and hospital records.  Pt sts that she "is an alcoholic" and has been sober for 3 1/2 years until approximately 1 month ago.  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  Pt sts that that event triggered her to begin to drink alcohol again.  Pt sts for about 3 weeks she drank 1 glass of wine a week.  Pt st that for the last week she has been drinking about 3 glasses of wine a day to the point of intoxication.  Pt sts she is also prescribed Xanax and sometimes takes these while drinking alcohol. Pt sts recently her son realized that she had been drinking alcohol and poured out all her Xanax to prevent her from taking them. Pt sts that she has begun to have SI and although not making any specific plans to kill herself, she is mixing alcohol and Xanax and wishing for an accident to kill her.  Pt sts that " if I was killed by a car I couldn't care less." Pt sts she does have a hx of blackouts. Pt sts that her parents are keeping her children until she can recover and pt sts that if she returns home tonight "I can't say what will happen." Pt sts that "I ma feeling like everybody would be better off if I weren't here." Pt denies HI, SUI and AVH. Pt denies physical, sexual and emotional/verbal abuse.  Pt sts she has been seeing Dr. Evelene Croon for about 3-4 years for medication management but is not seeing and has not seen a therapist. Pt sts that she usually sleeps 8 hours and eats well, recently having gained a little weight due to being prescribed prednisone for asthma. Pt has symptoms of depression including deep sadness, fatigue, excessive guilt, lower self esteem, tearfulness, self isolating, lack of motivation for activities,  irritability, feeling helpless and hopeless.   Pt sts she lives with her husband and 2 children, ages 35 and 35 yo. 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  Pt sts that she becomes aggressive if she has been drinking alcohol but, otherwise has no problems with anger or aggression.  Pt sts that she was charged with a DUI in 2013. Pt sts she has no new charges, no upcoming court dates and is not on probation. Pt sts she has no past attempts to kill herself or anyone else. Pt sts she does not have a hx of self harming behaviors. Pt sts her mother does have a hx of tx for depression and SI.  Pt sts she has a hx of being diagnosed with depression and anxiety including panic attacks.   Pt was cooperative, alert and pleasant during the assessment. She was tearful throughout. Pt spoke in a low, soft voice and moved in normal manner.  Pt's thought processes were coherent and relevant. Pt's mood was depressed and her blunted affect was congruent. Pt was oriented x 4.   Axis I: 311 Unspecified Depressive Disorder;  303.90 Alcohol Use Disorder, Moderate Axis II: Deferred Axis III:  Past Medical History  Diagnosis Date  . PVC (premature ventricular contraction)   . Palpitations   . Pre-syncope   .  Tachycardia   . Depression   . Insomnia   . Seasonal allergies   . Asthma   . Anxiety   . Depression   . Insomnia   . GERD (gastroesophageal reflux disease)    Axis IV: occupational problems, other psychosocial or environmental problems, problems related to legal system/crime, problems related to social environment, problems with access to health care services and problems with primary support group Axis V: 11-20 some danger of hurting self or others possible OR occasionally fails to maintain minimal personal hygiene OR gross impairment in communication  Past Medical History:  Past Medical History  Diagnosis Date  . PVC (premature ventricular contraction)   . Palpitations   . Pre-syncope   .  Tachycardia   . Depression   . Insomnia   . Seasonal allergies   . Asthma   . Anxiety   . Depression   . Insomnia   . GERD (gastroesophageal reflux disease)     Past Surgical History  Procedure Laterality Date  . Cesarean section  10/15/2009, 02/08/2006    times 2    Family History:  Family History  Problem Relation Age of Onset  . Heart disease Father   . Rheumatologic disease Mother   . Lung cancer Paternal Grandmother     Social History:  reports that she has never smoked. She has never used smokeless tobacco. She reports that she does not drink alcohol or use illicit drugs.  Additional Social History:  Alcohol / Drug Use History of alcohol / drug use?: Yes Longest period of sobriety (when/how long): 3 1/2 yrs Negative Consequences of Use: Financial, Armed forces operational officer, Personal relationships, Work / School Substance #1 Name of Substance 1: Alcohol 1 - Age of First Use: 15 1 - Amount (size/oz): Prior to 1 month ago nothing/ for 3 weeks: 1 glass wine per week/ last week: 3 glasses of wine per day 1 - Last Use / Amount: yesterday  CIWA:   COWS:    PATIENT STRENGTHS: (choose at least two) Ability for insight Average or above average intelligence Capable of independent living Communication skills Supportive family/friends  Allergies:  Allergies  Allergen Reactions  . Penicillins Rash    Home Medications:  (Not in a hospital admission)  OB/GYN Status:  No LMP recorded. Patient is not currently having periods (Reason: IUD).  General Assessment Data Location of Assessment: [facility] (Walk-In) TTS Assessment: In system Is this a Tele or Face-to-Face Assessment?: Face-to-Face Is this an Initial Assessment or a Re-assessment for this encounter?: Initial Assessment Marital status: Married Is patient pregnant?: Unknown Pregnancy Status: Unknown Living Arrangements: Spouse/significant other, Children (9 and 5 yo children) Can pt return to current living  arrangement?: Yes Admission Status: Voluntary Is patient capable of signing voluntary admission?: Yes Referral Source: Self/Family/Friend Insurance type: [facility]  [facility] Medical Exam completed: No (being transported to [facility] currently) Reason for MSE not completed: Other: [facility] Walk-In; going to [facility] now)  Crisis Care Plan Living Arrangements: Spouse/significant other, Children (9 and 5 yo children) Name of Psychiatrist: Dr. Evelene Croon Name of Therapist: none  Education Status Is patient currently in school?: No Current Grade: na Highest grade of school patient has completed: college Name of school: na Contact person: na  Risk to self with the past 6 months Suicidal Ideation: Yes-Currently Present Has patient been a risk to self within the past 6 months prior to admission? : No Suicidal Intent: Yes-Currently Present Has patient had any suicidal intent within  the past 6 months prior to admission? : No Is patient at risk for suicide?: Yes Suicidal Plan?:  (no specific plan- hope of accidental harm happening to h) Has patient had any suicidal plan within the past 6 months prior to admission? : No Access to Means: No What has been your use of drugs/alcohol within the last 12 months?: daily use of alcohol Previous Attempts/Gestures: No How many times?: 0 Other Self Harm Risks: none Triggers for Past Attempts:  (none noted) Intentional Self Injurious Behavior: None Family Suicide History: Yes (Mother) Recent stressful life event(s): Loss (Comment) (loss of a pt same age as son- pt is respiratory therapist) Persecutory voices/beliefs?: No Depression: Yes Depression Symptoms: Despondent, Tearfulness, Isolating, Fatigue, Guilt, Loss of interest in usual pleasures, Feeling worthless/self pity Substance abuse history and/or treatment for substance abuse?: Yes Suicide prevention information given to non-admitted patients: Not applicable  Risk to Others  within the past 6 months Homicidal Ideation: No (denies) Does patient have any lifetime risk of violence toward others beyond the six months prior to admission? : No Thoughts of Harm to Others: No Current Homicidal Intent: No Current Homicidal Plan: No Access to Homicidal Means: No Identified Victim: na History of harm to others?: No (denies) Assessment of Violence: None Noted Violent Behavior Description: na Does patient have access to weapons?: No Criminal Charges Pending?: No Does patient have a court date: No Is patient on probation?: No  Psychosis Hallucinations: None noted Delusions: None noted  Mental Status Report Appearance/Hygiene: Disheveled, Unremarkable Eye Contact: Good Motor Activity: Freedom of movement, Unremarkable Speech: Logical/coherent, Soft, Slow Level of Consciousness: Alert Mood: Depressed, Pleasant Affect: Blunted, Depressed Anxiety Level: Minimal Thought Processes: Coherent, Relevant Judgement: Partial Orientation: Person, Place, Time, Situation Obsessive Compulsive Thoughts/Behaviors: None  Cognitive Functioning Concentration: Fair Memory: Recent Intact, Remote Intact IQ: Average Insight: Fair Impulse Control: Poor Appetite: Good Weight Loss: 0 Weight Gain: 0 Sleep: No Change Total Hours of Sleep: 8 Vegetative Symptoms: Staying in bed, Not bathing, Decreased grooming (for the last few days per pt)  ADLScreening [facility] Assessment Services) Patient's cognitive ability adequate to safely complete daily activities?: Yes Patient able to express need for assistance with ADLs?: Yes Independently performs ADLs?: Yes (appropriate for developmental age)  Prior Inpatient Therapy Prior Inpatient Therapy: No Prior Therapy Dates: na Prior Therapy Facilty/Provider(s): na Reason for Treatment: na  Prior Outpatient Therapy Prior Outpatient Therapy: Yes Prior Therapy Dates: Dr. Evelene Croon and [facility] 5-6 years ago Prior Therapy Facilty/Provider(s): Dr. Evelene Croon;  E[facility] provider Reason for Treatment: depression, SA Does patient have an ACCT team?: No Does patient have Intensive In-House Services?  : No  ADL Screening (condition at time of admission) Patient's cognitive ability adequate to safely complete daily activities?: Yes Patient able to express need for assistance with ADLs?: Yes Independently performs ADLs?: Yes (appropriate for developmental age)       Abuse/Neglect Assessment (Assessment to be complete while patient is alone) Physical Abuse: Denies Verbal Abuse: Denies Sexual Abuse: Denies Exploitation of patient/patient's resources: Denies Self-Neglect: Denies     Merchant navy officer (For Healthcare) Does patient have an advance directive?: No Would patient like information on creating an advanced directive?: No - patient declined information    Additional Information 1:1 In Past 12 Months?: No CIRT Risk: No Elopement Risk: No Does patient have medical clearance?: No     Disposition:  Disposition Initial Assessment Completed for this Encounter: Yes Disposition of Patient: Inpatient treatment program (Per Dr. Elna Breslow; Accepted to 303-1 per Tanna Savoy) Type  of inpatient treatment program: Adult  Per Dr. Elna Breslow: Meets IP criteria.   Beryle Flock, MS, [facility], [facility] Triage Specialist [facility] T 07/13/2015 8:06 PM

## 2015-07-13 NOTE — ED Notes (Signed)
Pt arrives to the ER from [facility] for medical clearance; pt c/o of increased depression; denies SI / HI; pt states that she has began drinking again after being sober for 3 years; pt also reports smoking marijuana

## 2015-07-13 NOTE — BH Assessment (Signed)
Tele Assessment Note   Kelsey [name] is an 34 y.o. female. Presenting to [name] as a walk in reporting she does not feel like herself. 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 A month ago she had a 35 y.o pt who had tried to hang himself. She reports this triggered her because her own son hide tied a rope around his neck recently trying to get attention. Pt also reports today a many tried to get in her car, and her two children where in there. She report she has been drinking for the past month after 3 years sober, due to the work trauma. She reports also started prednizone three days ago and has not felt herself. She made a suicidal comment today, but denies planning or intent. She reports she was especially upset because when she reached out to her mother, her mother hung up on her.    At the time of assessment pt is alert and oriented times 4, however she has been drinking, and is unsteady on her feet. Mood is depressed and anxious with appropriate affect. Speech is logical and coherent, and of normal rate and fluency. Pt denies SI. HI, self-harm, and AVH. She reports drinking 1-2 glasses of wine every couple of days for the past month and drinking 3 glasses of wine today. Pt reports she is followed by Kelsey [name] and is treated for anxiety and depression. She reports he 37 y.o son dumped out her pills when she made suicidal comment and she was concerned about seizures because she takes 1 mg Xanax three time per day.   Pt endorses the following sx of depression: crying, feeling overwhelmed, and not like herself. She reports she had been doing better with depression, having a hx of recurrent depression for 10 years. She denies sx of mania.   Pt is treated for panic disorder and GAD. Pt endorses the following sx of anxiety frequent panic attacks, worrying about money family stressors, and life in general. She denies hx of abuse or neglect but reports work can be traumatic at times.    Pt began using etoh at age 22 and was drinking heavily until three years ago. No illicit drug use.  Family history is depression, and SA. Pt is able to contract for safety and husband is present to drive her home. Educate pt on safety planning, and reaching out for OP resources. Dx signs that would indicate she should return to ED for Kelsey [name]. Pt voiced understanding. Pt will call Kelsey [name] first thing in AM and initiate counseling.     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 PVC (premature ventricular contraction)   . Palpitations   . Pre-syncope   . Tachycardia   . Depression   . Insomnia   . Seasonal allergies   . Asthma   . Anxiety   . Depression   . Insomnia   . GERD (gastroesophageal reflux disease)     Past Surgical History  Procedure Laterality Date  . Cesarean section  10/15/2009, 02/08/2006    times 2    Family History:  Family History  Problem Relation Age of Onset  . Heart disease Father   . Rheumatologic disease Mother   . Lung cancer Paternal Grandmother     Social History:  reports that she has never smoked. She  has never used smokeless tobacco. She reports that she does not drink alcohol or use illicit drugs.  Additional Social History:  Alcohol / Drug Use Pain Medications: See PTA, denies abuse Prescriptions: reports sees Kelsey [name] for medications Over the Counter: See PTA History of alcohol / drug use?: Yes Longest period of sobriety (when/how long): 3 years sober until a month ago, no hx of seizures Negative Consequences of Use: Personal relationships, Legal Withdrawal Symptoms:  (none reported at this time) Substance #1 Name of Substance 1: etoh  1 - Age of First Use: 15 1 - Amount (size/oz): varied 1 - Frequency: for the last month every couple of days 1 - Duration: month at current level  1 - Last  Use / Amount: 07-12-15 3 glasses of wine   CIWA:   COWS:    PATIENT STRENGTHS: (choose at least two) Average or above average intelligence Supportive family/friends Work skills  Allergies:  Allergies  Allergen Reactions  . Penicillins Rash    Home Medications:  (Not in a [name] admission)  OB/GYN Status:  No LMP recorded. Patient is not currently having periods (Reason: IUD).  General Assessment Data Location of Assessment: [name] TTS Assessment: In system Is this a Tele or Face-to-Face Assessment?: Face-to-Face Is this an Initial Assessment or a Re-assessment for this encounter?: Initial Assessment Marital status: Married Is patient pregnant?: No Pregnancy Status: No Living Arrangements: Spouse/significant other, Children Can pt return to current living arrangement?: Yes Admission Status: Voluntary Is patient capable of signing voluntary admission?: Yes Referral Source: Self/Family/Friend Insurance type: [facility]  Medical Screening Exam [name] Walk-[facility] ONLY) Medical Exam completed: No Reason for MSE not completed: Patient Refused  Crisis Care Plan Living Arrangements: Spouse/significant other, Children Name of Psychiatrist: Dr. Evelene [name] Name of Therapist: none  Education Status Is patient currently in school?: No Current Grade: NA Highest grade of school patient has completed: college Name of school: NA Contact person: NA  Risk to self with the past 6 months Suicidal Ideation: No-Not Currently/Within Last 6 Months Has patient been a risk to self within the past 6 months prior to admission? : No Suicidal Intent: No Has patient had any suicidal intent within the past 6 months prior to admission? : No Is patient at risk for suicide?: No Suicidal Plan?: No Has patient had any suicidal plan within the past 6 months prior to admission? : No Access to Means: No What has been your use of drugs/alcohol within the last 12 months?: Pt has a hx of abusing  etoh, was sober three years, relapse this month  Previous Attempts/Gestures: No How many times?: 0 Other Self Harm Risks: none Triggers for Past Attempts: None known Intentional Self Injurious Behavior: None Family Suicide History: Yes (mother ideation ) Recent stressful life event(s): Trauma (Comment) (work trauma) Persecutory voices/beliefs?: No Depression: Yes Depression Symptoms: Despondent, Tearfulness (reports no sx recently until today) Substance abuse history and/or treatment for substance abuse?: Yes Suicide prevention information given to non-admitted patients: Yes  Risk to Others within the past 6 months Homicidal Ideation: No Does patient have any lifetime risk of violence toward others beyond the six months prior to admission? : No Thoughts of Harm to Others: No Current Homicidal Intent: No Current Homicidal Plan: No Access to Homicidal Means: No Identified Victim: none History of harm to others?: No Assessment of Violence: None Noted Violent Behavior Description: none Does patient have access to weapons?: No Criminal Charges Pending?: No Does patient have a court date: No Is  patient on probation?: No  Psychosis Hallucinations: None noted Delusions: None noted  Mental Status Report Appearance/Hygiene: Unremarkable Eye Contact: Good Motor Activity: Unsteady Speech: Logical/coherent Level of Consciousness: Alert Mood: Depressed, Anxious Affect: Appropriate to circumstance Anxiety Level: Severe Thought Processes: Coherent, Relevant Judgement: Partial Orientation: Person, Place, Time, Situation Obsessive Compulsive Thoughts/Behaviors: None  Cognitive Functioning Concentration: Normal Memory: Recent Intact, Remote Intact IQ: Average Insight: Good Impulse Control: Fair Appetite: Good Weight Gain: 0 Sleep: No Change Total Hours of Sleep: 8 (with medication ) Vegetative Symptoms: None  ADLScreening [name] Assessment Services) Patient's cognitive ability  adequate to safely complete daily activities?: Yes Patient able to express need for assistance with ADLs?: Yes Independently performs ADLs?: Yes (appropriate for developmental age)  Prior Inpatient Therapy Prior Inpatient Therapy: No Prior Therapy Dates: NA Prior Therapy Facilty/Provider(s): NA Reason for Treatment: NA  Prior Outpatient Therapy Prior Outpatient Therapy: Yes Prior Therapy Dates: ongoing with [name], tried [facility] 8 years ago  Prior Therapy Facilty/Provider(s): Kel[facility] [name], [facility] in past Reason for Treatment: depression, anxiety  Does patient have an ACCT team?: No Does patient have Intensive In-House Services?  : No Does patient have [facility] services? : No Does patient have P4CC services?: No  ADL Screening (condition at time of admission) Patient's cognitive ability adequate to safely complete daily activities?: Yes Is the patient deaf or have difficulty hearing?: No Does the patient have difficulty seeing, even when wearing glasses/contacts?: No Patient able to express need for assistance with ADLs?: Yes Does the patient have difficulty dressing or bathing?: No Independently performs ADLs?: Yes (appropriate for developmental age) Does the patient have difficulty walking or climbing stairs?: No Weakness of Legs: None Weakness of Arms/Hands: None  Home Assistive Devices/Equipment Home Assistive Devices/Equipment: Nebulizer    Abuse/Neglect Assessment (Assessment to be complete while patient is alone) Physical Abuse: Denies Verbal Abuse: Denies Sexual Abuse: Denies Exploitation of patient/patient's resources: Denies Self-Neglect: Denies Values / Beliefs Cultural Requests During Hospitalization: None Spiritual Requests During Hospitalization: None   Advance Directives (For Healthcare) Does patient have an advance directive?: No Would patient like information on creating an advanced directive?: No - patient declined information Nutrition Screen- [facility]  Adult/WL/AP Patient's home diet: Regular Has the patient recently lost weight without trying?: No Has the patient been eating poorly because of a decreased appetite?: No Malnutrition Screening Tool Score: 0  Additional Information 1:1 In Past 12 Months?: No CIRT Risk: No Elopement Risk: No Does patient have medical clearance?: No     Disposition:  Per Donell Sievert, PA pt does not meet inpt criteria and can be discharged to follow up with [facility] [name] in AM.   Clista Bernhardt, [name] Triage Specialist 07/13/2015 12:28 AM  Disposition Initial Assessment Completed for this Encounter: Yes Disposition of Patient: Outpatient treatment  Barry Faircloth M 07/13/2015 12:18 AM

## 2015-07-13 NOTE — ED Provider Notes (Signed)
CSN: [id number]     Arrival date & time 07/13/15  2003 History   First MD Initiated Contact with Patient 07/13/15 2207     Chief Complaint  Patient presents with  . Medical Clearance     (Consider location/radiation/quality/duration/timing/severity/associated sxs/prior Treatment) HPI Comments: 34 year old female presents to the emergency department from [facility] for medical clearance. Patient complaining of worsening depression as well as alcohol abuse. Patient states that she has a history of alcoholism. She was sober for 3.5 years up until 1 week ago. She states that she has noticed an increase in her drinking over the last week. No SI/HI. She does report smoking marijuana recently. No other illicit drug use.  The history is provided by the patient. No language interpreter was used.    Past Medical History  Diagnosis Date  . PVC (premature ventricular contraction)   . Palpitations   . Pre-syncope   . Tachycardia   . Depression   . Insomnia   . Seasonal allergies   . Asthma   . Anxiety   . Depression   . Insomnia   . GERD (gastroesophageal reflux disease)    Past Surgical History  Procedure Laterality Date  . Cesarean section  10/15/2009, 02/08/2006    times 2   Family History  Problem Relation Age of Onset  . Heart disease Father   . Rheumatologic disease Mother   . Lung cancer Paternal Grandmother    Social History  Substance Use Topics  . Smoking status: Never Smoker   . Smokeless tobacco: Never Used     Comment: reports "tried it as a teenager" but has not smoked since  . Alcohol Use: 12.6 oz/week    0 Standard drinks or equivalent, 21 Glasses of wine per week     Comment: pt states 2-3 glasses of wine per day   OB History    No data available      Review of Systems  Psychiatric/Behavioral: Positive for behavioral problems.  All other systems reviewed and are negative.   Allergies  Penicillins  Home Medications   Prior to  Admission medications   Medication Sig Start Date End Date Taking? Authorizing Provider  albuterol (PROVENTIL) (5 MG/ML) 0.5% nebulizer solution Take 5 mg by nebulization every 6 (six) hours as needed. For wheeze and shortness of breath   Yes Historical Provider, MD  ALPRAZolam (XANAX) 0.5 MG tablet Take 1 mg by mouth 3 (three) times daily as needed for anxiety. For anxiety   Yes Historical Provider, MD  beclomethasone (QVAR) 80 MCG/ACT inhaler Inhale 1 puff into the lungs 2 (two) times daily. 03/11/14  Yes Oretha Milch, MD  budesonide-formoterol (SYMBICORT) 160-4.5 MCG/ACT inhaler Inhale 2 puffs into the lungs 2 (two) times daily. 02/04/15  Yes Oretha Milch, MD  desvenlafaxine (PRISTIQ) 50 MG 24 hr tablet Take 50 mg by mouth daily.   Yes Historical Provider, MD  esomeprazole (NEXIUM) 40 MG capsule Take 40 mg by mouth daily at 12 noon.   Yes Historical Provider, MD  HYDROcodone-homatropine (HYCODAN) 5-1.5 MG/5ML syrup Take 5 mLs by mouth every 6 (six) hours as needed for cough. 06/29/15  Yes Tammy S Parrett, NP  mirtazapine (REMERON) 15 MG tablet Take 45 mg by mouth at bedtime.    Yes Historical Provider, MD  montelukast (SINGULAIR) 10 MG tablet Take 1 tablet (10 mg total) by mouth at bedtime. 02/04/15  Yes Oretha Milch, MD  predniSONE (DELTASONE) 20 MG tablet Take 20 mg by mouth  3 (three) times daily. For 5 days   Yes Historical Provider, MD  PROAIR HFA 108 (90 BASE) MCG/ACT inhaler INHALE 2 PUFFS INTO THE LUNGS EVERY 4 HOURS AS NEEDED (FOR SHORTNESS OF BREATH). 02/09/15  Yes Oretha Milch, MD  traZODone (DESYREL) 100 MG tablet Take 100 mg by mouth at bedtime as needed for sleep.   Yes Historical Provider, MD  azithromycin (ZITHROMAX Z-PAK) 250 MG tablet Take as directed Patient not taking: Reported on 07/13/2015 06/29/15   Virgel Bouquet Parrett, NP  cetirizine (ZYRTEC) 10 MG tablet Take 10 mg by mouth daily.    Historical Provider, MD  cyclobenzaprine (FLEXERIL) 10 MG tablet Take 1 tablet (10 mg total) by  mouth at bedtime. Patient not taking: Reported on 07/13/2015 01/12/15   Tonye Pearson, MD  EPINEPHrine 0.3 mg/0.3 mL IJ SOAJ injection Inject 0.3 mLs (0.3 mg total) into the muscle once. 03/25/15   Oretha Milch, MD  levonorgestrel (MIRENA) 20 MCG/24HR IUD 1 each by Intrauterine route once.    Historical Provider, MD  predniSONE (DELTASONE) 10 MG tablet Take 2 tablets with food x 5 days, then 1 tablet x 5 days , then STOP Patient not taking: Reported on 07/13/2015 06/11/15   Oretha Milch, MD   BP 136/88 mmHg  Pulse 106  Temp(Src) 97.4 F (36.3 C)  Resp 18  SpO2 95%   Physical Exam  Constitutional: She is oriented to person, place, and time. She appears well-developed and well-nourished. No distress.  HENT:  Head: Normocephalic and atraumatic.  Eyes: Conjunctivae and EOM are normal. No scleral icterus.  Neck: Normal range of motion.  Pulmonary/Chest: Effort normal. No respiratory distress.  Musculoskeletal: Normal range of motion.  Neurological: She is alert and oriented to person, place, and time. She exhibits normal muscle tone. Coordination normal.  Skin: Skin is warm and dry. No rash noted. She is not diaphoretic. No erythema. No pallor.  Psychiatric: Her speech is normal and behavior is normal. She exhibits a depressed mood. She expresses no homicidal and no suicidal ideation.  Nursing note and vitals reviewed.   ED Course  Procedures (including critical care time) Labs Review Labs Reviewed  ACETAMINOPHEN LEVEL - Abnormal; Notable for the following:    Acetaminophen (Tylenol), Serum <10 (*)    All other components within normal limits  CBC - Abnormal; Notable for the following:    Hemoglobin 15.1 (*)    HCT 46.4 (*)    All other components within normal limits  URINE RAPID DRUG SCREEN, HOSP PERFORMED - Abnormal; Notable for the following:    Opiates POSITIVE (*)    Benzodiazepines POSITIVE (*)    All other components within normal limits  COMPREHENSIVE METABOLIC PANEL   ETHANOL  SALICYLATE LEVEL  I-STAT BETA HCG BLOOD, ED ([facility], [facility], [facility] ONLY)    Imaging Review No results found. I have personally reviewed and evaluated these images and lab results as part of my medical decision-making.   EKG Interpretation None      MDM   Final diagnoses:  Depression  Alcohol use disorder, mild, abuse    34 year old female presents to the emergency department for medical clearance. Labs reviewed and patient medically cleared. She is stable for transfer to behavioral health for further psychiatric treatment.   Filed Vitals:   07/13/15 2158 07/14/15 0008  BP: 145/83 136/88  Pulse:  106  Temp: 97.4 F (36.3 C)   Resp: 18 18  SpO2: 100% 95%  Antony Madura, PA-C 07/14/15 0125  Lyndal Pulley, MD 07/14/15 [phone]

## 2015-07-14 ENCOUNTER — Encounter (HOSPITAL_COMMUNITY): Payer: Self-pay | Admitting: Nurse Practitioner

## 2015-07-14 ENCOUNTER — Inpatient Hospital Stay (HOSPITAL_COMMUNITY)
Admission: EM | Admit: 2015-07-14 | Discharge: 2015-07-16 | DRG: 885 | Disposition: A | Discharge status: Home / Self Care | Payer: 59 | Source: Intra-hospital | Attending: Psychiatry | Admitting: Psychiatry

## 2015-07-14 DIAGNOSIS — F329 Major depressive disorder, single episode, unspecified: Secondary | ICD-10-CM | POA: Diagnosis present

## 2015-07-14 DIAGNOSIS — Z8249 Family history of ischemic heart disease and other diseases of the circulatory system: Secondary | ICD-10-CM | POA: Diagnosis not present

## 2015-07-14 DIAGNOSIS — G4700 Insomnia, unspecified: Secondary | ICD-10-CM | POA: Diagnosis present

## 2015-07-14 DIAGNOSIS — F1010 Alcohol abuse, uncomplicated: Secondary | ICD-10-CM | POA: Diagnosis present

## 2015-07-14 DIAGNOSIS — F410 Panic disorder [episodic paroxysmal anxiety] without agoraphobia: Secondary | ICD-10-CM | POA: Diagnosis present

## 2015-07-14 DIAGNOSIS — K219 Gastro-esophageal reflux disease without esophagitis: Secondary | ICD-10-CM | POA: Diagnosis present

## 2015-07-14 DIAGNOSIS — F32A Depression, unspecified: Secondary | ICD-10-CM | POA: Diagnosis present

## 2015-07-14 DIAGNOSIS — F411 Generalized anxiety disorder: Secondary | ICD-10-CM | POA: Diagnosis present

## 2015-07-14 DIAGNOSIS — F331 Major depressive disorder, recurrent, moderate: Principal | ICD-10-CM | POA: Diagnosis present

## 2015-07-14 LAB — RAPID URINE DRUG SCREEN, HOSP PERFORMED
Amphetamines: NOT DETECTED
BENZODIAZEPINES: POSITIVE — AB
Barbiturates: NOT DETECTED
COCAINE: NOT DETECTED
OPIATES: POSITIVE — AB
TETRAHYDROCANNABINOL: NOT DETECTED

## 2015-07-14 MED ORDER — TRAZODONE HCL 100 MG PO TABS
200.0000 mg | ORAL_TABLET | Freq: Every evening | ORAL | Status: DC | PRN
  Administered 2015-07-14 – 2015-07-15 (×2): 200 mg via ORAL
  Filled 2015-07-14 (×2): qty 2
  Filled 2015-07-14: qty 6

## 2015-07-14 MED ORDER — PREDNISONE 20 MG PO TABS
20.0000 mg | ORAL_TABLET | Freq: Three times a day (TID) | ORAL | Status: AC
  Administered 2015-07-14 – 2015-07-15 (×4): 20 mg via ORAL
  Filled 2015-07-14 (×4): qty 1

## 2015-07-14 MED ORDER — ALPRAZOLAM 1 MG PO TABS
1.0000 mg | ORAL_TABLET | Freq: Three times a day (TID) | ORAL | Status: DC | PRN
  Administered 2015-07-14 – 2015-07-16 (×8): 1 mg via ORAL
  Filled 2015-07-14 (×8): qty 1

## 2015-07-14 MED ORDER — PREDNISONE 20 MG PO TABS
20.0000 mg | ORAL_TABLET | Freq: Three times a day (TID) | ORAL | Status: DC
  Administered 2015-07-14: 20 mg via ORAL
  Filled 2015-07-14 (×7): qty 1

## 2015-07-14 MED ORDER — MIRTAZAPINE 15 MG PO TABS
45.0000 mg | ORAL_TABLET | Freq: Every day | ORAL | Status: DC
  Administered 2015-07-14 – 2015-07-15 (×2): 45 mg via ORAL
  Filled 2015-07-14 (×2): qty 1
  Filled 2015-07-14: qty 9
  Filled 2015-07-14: qty 1
  Filled 2015-07-14: qty 9

## 2015-07-14 MED ORDER — MAGNESIUM HYDROXIDE 400 MG/5ML PO SUSP: 30.0000 mL | Freq: Every day | ORAL | Status: DC | PRN

## 2015-07-14 MED ORDER — ALPRAZOLAM 0.5 MG PO TABS
0.5000 mg | ORAL_TABLET | Freq: Once | ORAL | Status: AC
  Administered 2015-07-14: 0.5 mg via ORAL
  Filled 2015-07-14: qty 1

## 2015-07-14 MED ORDER — MONTELUKAST SODIUM 10 MG PO TABS
10.0000 mg | ORAL_TABLET | Freq: Every day | ORAL | Status: DC
  Administered 2015-07-14 – 2015-07-15 (×2): 10 mg via ORAL
  Filled 2015-07-14 (×4): qty 1

## 2015-07-14 MED ORDER — ALBUTEROL SULFATE HFA 108 (90 BASE) MCG/ACT IN AERS: 2.0000 | INHALATION_SPRAY | RESPIRATORY_TRACT | Status: DC | PRN

## 2015-07-14 MED ORDER — BECLOMETHASONE DIPROPIONATE 80 MCG/ACT IN AERS
1.0000 | INHALATION_SPRAY | Freq: Two times a day (BID) | RESPIRATORY_TRACT | Status: DC
  Administered 2015-07-14 – 2015-07-16 (×4): 1 via RESPIRATORY_TRACT
  Filled 2015-07-14: qty 8.7

## 2015-07-14 MED ORDER — TRAZODONE HCL 100 MG PO TABS: 100.0000 mg | ORAL_TABLET | Freq: Every evening | ORAL | Status: DC | PRN

## 2015-07-14 MED ORDER — ALBUTEROL SULFATE HFA 108 (90 BASE) MCG/ACT IN AERS
2.0000 | INHALATION_SPRAY | Freq: Four times a day (QID) | RESPIRATORY_TRACT | Status: DC | PRN
  Administered 2015-07-14 (×2): 2 via RESPIRATORY_TRACT
  Filled 2015-07-14: qty 6.7

## 2015-07-14 MED ORDER — PANTOPRAZOLE SODIUM 40 MG PO TBEC
40.0000 mg | DELAYED_RELEASE_TABLET | Freq: Every day | ORAL | Status: DC
Start: 2015-07-14 — End: 2015-07-16
  Administered 2015-07-14 – 2015-07-16 (×3): 40 mg via ORAL
  Filled 2015-07-14 (×7): qty 1

## 2015-07-14 MED ORDER — MIRTAZAPINE 15 MG PO TABS
15.0000 mg | ORAL_TABLET | Freq: Once | ORAL | Status: AC
  Administered 2015-07-14: 15 mg via ORAL
  Filled 2015-07-14 (×2): qty 1

## 2015-07-14 MED ORDER — ACETAMINOPHEN 325 MG PO TABS: 650.0000 mg | ORAL_TABLET | Freq: Four times a day (QID) | ORAL | Status: DC | PRN

## 2015-07-14 MED ORDER — TRAZODONE HCL 50 MG PO TABS: 50.0000 mg | ORAL_TABLET | Freq: Every evening | ORAL | Status: DC | PRN

## 2015-07-14 MED ORDER — BUDESONIDE-FORMOTEROL FUMARATE 160-4.5 MCG/ACT IN AERO
2.0000 | INHALATION_SPRAY | Freq: Two times a day (BID) | RESPIRATORY_TRACT | Status: DC
  Administered 2015-07-14 – 2015-07-16 (×4): 2 via RESPIRATORY_TRACT
  Filled 2015-07-14: qty 6

## 2015-07-14 MED ORDER — ALUM & MAG HYDROXIDE-SIMETH 200-200-20 MG/5ML PO SUSP: 30.0000 mL | ORAL | Status: DC | PRN

## 2015-07-14 NOTE — Progress Notes (Signed)
D: Kelsey [name] was admitted to the unit. She was calm and cooperative during her assessment. She is very tearful while answering questions and states she is under a lot of stress especially with her job. Kelsey [name] recently started abusing alcohol approximately 1 month ago. She had been sober for 3.5 years prior to her recent relapse. 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 This was one of the initial triggers of her binging. She sees a psychiatrist Dr. Evelene Croon who is managing her anxiety with xanax TID. Over the past month Kelsey [name] has been drinking up to 4 glasses of wine a day. She denies substance abuse or cigarette smoking. Endorses marijuana use over the past weekend while she was at the beach for vacation. She started drinking in her teens. She has a 83 year old son and 50 year old daughter and states she wants to get better for her children. She endorses anxiety, depression, crying spells, hopelessness, helplessness, insomnia, nervousness, restlessness and panic attacks. She denies SI/HI/AVH. She also denies physical, verbal or sexual abuse. She contracts for safety. She has a supportive spouse and other family members. She states she would like to have her medications adjusted to help ensure she is not going from one addiction to another i.e.. (xanax-alcohol-xanax). She is also requiring Remeron and trazodone to help her sleep.  A: Oriented Kelsey [name] to the unit. Encouraged Kelsey [name] to make sure she speaks with the psychiatrist in the morning about adjusting her medications.   R: Continue to monitor for patient safety and medication effectiveness.

## 2015-07-14 NOTE — Progress Notes (Signed)
D:Per patient self inventory form patient reports she slept good last night with the use of sleep medication. She reports a poor appetite, normal energy level, good concentration. She rates depression 3/10, hopelessness 3/10, anxiety 3/10- all on 1-10 scale, 10 being the worse. She denies physical problems. She reports her goal today is "developing a routine, feeling better." Reports she will meet her goal by "group therapy." Pt active in groups on the unit.  A:Special checks q 15 mins in place for safety, .Medication administered per MD  order (see eMAR). Encouragement and support provided.   R: Safety maintained. Compliant with medication regimen. Will continue to monitor.

## 2015-07-14 NOTE — BHH Group Notes (Signed)
[facility] LCSW Aftercare Discharge Planning Group Note   07/14/2015 11:18 AM  Participation Quality:  Pt attended aftercare planning group with Gypsy Balsam. LCSWA  Smart, Lebron Quam

## 2015-07-14 NOTE — Tx Team (Signed)
Interdisciplinary Treatment Plan Update (Adult)  Date:  07/14/2015  Time Reviewed:  9:32 AM   Progress in Treatment: Attending groups: Yes. Participating in groups:  Yes. Taking medication as prescribed:  Yes. Tolerating medication:  Yes. Family/Significant othe contact made:  Not yet. SPE required for this pt.  Patient understands diagnosis:  Yes. and As evidenced by:  seeking treatment for alcohol abuse, depression, and for medication management Discussing patient identified problems/goals with staff:  Yes. Medical problems stabilized or resolved:  Yes. Denies suicidal/homicidal ideation: Yes. Issues/concerns per patient self-inventory:  Other:  New problem(s) identified:    Discharge Plan or Barriers: Pt plans to continue o/p care with Dr. Toy Care and is interested in the [facility] program offered to [employer] employees for family counseling. CSW provided pt with this information.   Reason for Continuation of Hospitalization: Depression Medication stabilization Withdrawal symptoms  Comments:  Kelsey [name] was admitted to the unit. She was calm and cooperative during her assessment. She is very tearful while answering questions and states she is under a lot of stress especially with her job. Kelsey [name] recently started abusing alcohol approximately 1 month ago. She had been sober for 3.5 years prior to her recent relapse. 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 This was one of the initial triggers of her binging. She sees a psychiatrist Dr. Toy Care who is managing her anxiety with xanax TID. Over the past month Kelsey [name] has been drinking up to 4 glasses of wine a day. She denies substance abuse or cigarette smoking. Endorses marijuana use over the past weekend while she was at the beach for vacation. She started drinking in her teens. She has a 49 year old son and 19 year old daughter and states she wants to get  better for her children. She endorses anxiety, depression, crying spells, hopelessness, helplessness, insomnia, nervousness, restlessness and panic attacks. She denies SI/HI/AVH. She also denies physical, verbal or sexual abuse. She contracts for safety. She has a supportive spouse and other family members. She states she would like to have her medications adjusted to help ensure she is not going from one addiction to another i.e.. (xanax-alcohol-xanax). She is also requiring Remeron and trazodone to help her sleep.  A: Oriented Kelsey [name] to the unit. Encouraged Kelsey [name] to make sure she speaks with the psychiatrist in the morning about adjusting her medications.   Estimated length of stay:  3-5 days   Additional Comments:  Patient and CSW reviewed pt's identified goals and treatment plan. Patient verbalized understanding and agreed to treatment plan. CSW reviewed [facility] "Discharge Process and Patient Involvement" Form. Pt verbalized understanding of information provided and signed form.    Review of initial/current patient goals per problem list:  1. Goal(s): Patient will participate in aftercare plan  Met: Yes   Target date: at discharge  As evidenced by: Patient will participate within aftercare plan AEB aftercare provider and housing plan at discharge being identified.    8/17: return home and followup with Dr. Toy Care and with [facility] program offered to [facility] [facility] employees for family counseling.  2. Goal (s): Patient will exhibit decreased depressive symptoms and suicidal ideations.  Met: No   Target date: at discharge  As evidenced by: Patient will utilize self rating of depression at 3 or below and demonstrate decreased signs of depression or be deemed stable for discharge by MD.  8/17: Pt rates depression as  high this morning but denies SI/HI/AVH.   3. Goal(s): Patient will demonstrate decreased signs of withdrawal due to substance abuse  Met:No.   Target date:at discharge   As  evidenced by: Patient will produce a CIWA/COWS score of 0, have stable vitals signs, and no symptoms of withdrawal.  8/17: Pt reporting minimal withdrawal symptoms with CIWA score of 3 and high BP.    Attendees: Patient:   07/14/2015 9:32 AM   Family:   07/14/2015 9:32 AM   Physician:  Dr. Irving Lugo, MD 07/14/2015 9:32 AM   Nursing:   Caroline RN; Ashley RN 07/14/2015 9:32 AM   Clinical Social Worker:  Smart, LCSWA  07/14/2015 9:32 AM   Clinical Social Worker: Kristin Drinkard LCSWA; Lauren Carter LCSWA 07/14/2015 9:32 AM   Other:  Brilyn C. Nurse Case Manager 07/14/2015 9:32 AM   Other:  Valerie Enoch; [facility]  07/14/2015 9:32 AM   Other:   07/14/2015 9:32 AM   Other:  07/14/2015 9:32 AM   Other:  07/14/2015 9:32 AM   Other:  07/14/2015 9:32 AM    07/14/2015 9:32 AM    07/14/2015 9:32 AM    07/14/2015 9:32 AM    07/14/2015 9:32 AM    Scribe for Treatment Team:    Smart, LCSWA  07/14/2015 9:32 AM      

## 2015-07-14 NOTE — BHH Suicide Risk Assessment (Signed)
[facility] Admission Suicide Risk Assessment   Nursing information obtained from:  Patient Demographic factors:  Caucasian Current Mental Status:  NA Loss Factors:  NA Historical Factors:  Family history of mental illness or substance abuse Risk Reduction Factors:  Responsible for children under 35 years of age, Sense of responsibility to family, Employed, Living with another person, especially a relative, Positive social support, Positive therapeutic relationship Total Time spent with patient: 45 minutes Principal Problem: Depression, major, recurrent, moderate Diagnosis:   Patient Active Problem List   Diagnosis Date Noted  . Alcohol abuse [F10.10] 07/14/2015  . GAD (generalized anxiety disorder) [F41.1] 07/14/2015  . Depression, major, recurrent, moderate [F33.1] 07/14/2015  . Periscapular pain [M89.8X1] 01/19/2015  . Left-sided thoracic back pain-greater than 20 years duration [M54.6] 01/12/2015  . GERD (gastroesophageal reflux disease) [K21.9] 08/22/2013  . Asthma exacerbation [J45.901] 06/13/2012  . 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 Abnormal ECG- T  ST changes in V3 through V5 [R94.31] 05/02/2011     Continued Clinical Symptoms:  Alcohol Use Disorder Identification Test Final Score (AUDIT): 9 The "Alcohol Use Disorders Identification Test", Guidelines for Use in Primary Care, Second Edition.  World Science writer [facility]). Score between 0-7:  no or low risk or alcohol related problems. Score between 8-15:  moderate risk of alcohol related problems. Score between 16-19:  high risk of alcohol related problems. Score 20 or above:  warrants further diagnostic evaluation for alcohol dependence and treatment.   CLINICAL FACTORS:   Severe Anxiety and/or Agitation Depression:   Comorbid alcohol abuse/dependence Alcohol/Substance Abuse/Dependencies  Psychiatric Specialty Exam: Physical Exam  ROS  Blood pressure 130/97, pulse 129, temperature 97.6 F  (36.4 C), temperature source Oral, resp. rate 20, height  (1.626 m), weight 78.926 kg (174 lb).Body mass index is 29.85 kg/(m^2).    COGNITIVE FEATURES THAT CONTRIBUTE TO RISK:  Closed-mindedness, Polarized thinking and Thought constriction (tunnel vision)    SUICIDE RISK:   Moderate:  Frequent suicidal ideation with limited intensity, and duration, some specificity in terms of plans, no associated intent, good self-control, limited dysphoria/symptomatology, some risk factors present, and identifiable protective factors, including available and accessible social support.  PLAN OF CARE: Supportive approach/coping skills                               Depression; resume her psychotropics/optimize response                              Alcohol abuse; work a relapse prevention plan                               Trauma; help process the traumatic events                                CBT/mindfulness                                 Anxiety continue treatment with the Xanax 1 mg TID  Medical Decision Making:  Review of Psycho-Social Stressors (1), Review or order clinical lab tests (1), Review of Medication Regimen & Side Effects (2) and Review of New Medication or Change in Dosage (2)  I certify  that inpatient services furnished can reasonably be expected to improve the patient's condition.   Keon Benscoter A 07/14/2015, 6:12 PM

## 2015-07-14 NOTE — H&P (Signed)
Psychiatric Admission Assessment Adult  Patient Identification: KESHAWN SUNDBERG MRN:  [id number] Date of Evaluation:  07/14/2015 Chief Complaint:  Alcohol Use Disorder Principal Diagnosis: <principal problem not specified> Diagnosis:   Patient Active Problem List   Diagnosis Date Noted  . Depression [F32.9] 07/14/2015  . Periscapular pain [M89.8X1] 01/19/2015  . Left-sided thoracic back pain-greater than 20 years duration [M54.6] 01/12/2015  . GERD (gastroesophageal reflux disease) [K21.9] 08/22/2013  . Asthma exacerbation [J45.901] 06/13/2012  . 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 Abnormal ECG- T  ST changes in V3 through V5 [R94.31] 05/02/2011   History of Present Illness:: 34 Y/O female who states she has been sober for over 3 and a half years. 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 There was a boy who hanged himself and had to be taken off life support. States this boy was around her son's age. Admits she was affected by event this so much that went home and drank a one glass. Her use gradually increased up to for a week drinking 3 glasses a night. States her son saw her intoxicated and told her he did not want anything to do with her. She got upset. States she ws told she  said she was going to take all her pills and her son threw them away  The initial assessment is as follows: BRAYAH URQUILLA is an 34 y.o. female. Presenting to [facility] as a walk in reporting she does not feel like herself. 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 A month ago she had a 35 y.o pt who had tried to hang himself. She reports this triggered her because her own son hide tied a rope around his neck recently trying to get attention. Pt also reports today a many tried to get in her car, and her two children where in there. She report she has been drinking for the past month after 3 years sober, due to the work trauma. She reports also started prednizone  three days ago and has not felt herself. She made a suicidal comment today, but denies planning or intent. She reports she was especially upset because when she reached out to her mother, her mother hung up on her.    Elements:  Location:  Depression Anxiety alcohol abuse. Quality:  unable to function as dealing with the trauma from work the stress from her sons response to her relalpse feeling shame and guilt. Severity:  severe. Timing:  building up since the patient hanged himself . Duration:  every day for the last month. Context:  alcohol abuse in remission relapsed after a patient she was working with hanged himself was tken off ventilatos since then more depressed anxious hwil intoxicated son saw her and had a negative emotional reaction towards her  causing her to make comments suggesting she would take all her pills. Associated Signs/Symptoms: Depression Symptoms:  depressed mood, anhedonia, insomnia, fatigue, anxiety, panic attacks, loss of energy/fatigue, disturbed sleep, decreased appetite, (Hypo) Manic Symptoms:  Irritable Mood, Labiality of Mood, Anxiety Symptoms:  Excessive Worry, Panic Symptoms, Psychotic Symptoms:  denies PTSD Symptoms: Negative Total Time spent with patient: 45 minutes  Past Medical History:  Past Medical History  Diagnosis Date  . PVC (premature ventricular contraction)   . Palpitations   . Pre-syncope   . Tachycardia   . Depression   . Insomnia   . Seasonal allergies   . Asthma   . Anxiety   .  Depression   . Insomnia   . GERD (gastroesophageal reflux disease)     Past Surgical History  Procedure Laterality Date  . Cesarean section  10/15/2009, 02/08/2006    times 2   Family History:  Family History  Problem Relation Age of Onset  . Heart disease Father   . Rheumatologic disease Mother   . Lung cancer Paternal Grandmother   mother depression anxiety she is under psych care Social History:  History  Alcohol Use  . 12.6  oz/week  . 21 Glasses of wine, 0 Standard drinks or equivalent per week    Comment: pt states 2-3 glasses of wine per day     History  Drug Use  . Yes  . Special: Marijuana    Social History   Social History  . Marital Status: Married    Spouse Name: N/A  . Number of Children: 2  . Years of Education: N/A   Occupational History  . respiratory therapists [employer]   Social History Main Topics  . Smoking status: Never Smoker   . Smokeless tobacco: Never Used     Comment: reports "tried it as a teenager" but has not smoked since  . Alcohol Use: 12.6 oz/week    21 Glasses of wine, 0 Standard drinks or equivalent per week     Comment: pt states 2-3 glasses of wine per day  . Drug Use: Yes    Special: Marijuana  . Sexual Activity: Yes    Birth Control/ Protection: IUD   Other Topics Concern  . None   Social History Narrative  Lives with husband and her 16 Y/O and 5 Y/O. Works as a Statistician. Goes to a church.  Additional Social History:    Pain Medications: SEE PTA  Prescriptions: SEE PTA Over the Counter: SEE PTA History of alcohol / drug use?: Yes Longest period of sobriety (when/how long): 3.5 years Negative Consequences of Use: Work / Youth worker Withdrawal Symptoms: Tachycardia Name of Substance 1: Alcohol 1 - Age of First Use: 34 years old 1 - Amount (size/oz): 4-5 glasses per day  1 - Frequency: every day for the past month 1 - Duration: 1 month 1 - Last Use / Amount: 8-16                   Musculoskeletal: Strength & Muscle Tone: within normal limits Gait & Station: normal Patient leans: normal  Psychiatric Specialty Exam: Physical Exam  Review of Systems  Constitutional: Negative.   HENT: Negative.   Eyes: Negative.   Respiratory: Positive for cough and shortness of breath.   Cardiovascular: Positive for palpitations.  Gastrointestinal: Positive for heartburn.  Genitourinary: Negative.   Musculoskeletal: Negative.   Skin:  Negative.   Neurological: Negative.   Endo/Heme/Allergies: Negative.   Psychiatric/Behavioral: Positive for depression and substance abuse. The patient is nervous/anxious and has insomnia.     Blood pressure 130/97, pulse 129, temperature 97.6 F (36.4 C), temperature source Oral, resp. rate 20, height 5' 4"  (1.626 m), weight 78.926 kg (174 lb).Body mass index is 29.85 kg/(m^2).  General Appearance: Fairly Groomed  Engineer, water::  Fair  Speech:  Clear and Coherent  Volume:  Decreased  Mood:  Anxious and Depressed  Affect:  Depressed and Tearful  Thought Process:  Coherent and Goal Directed  Orientation:  Full (Time, Place, and Person)  Thought Content:  symptoms events worries concerns  Suicidal Thoughts:  No  Homicidal Thoughts:  No  Memory:  Immediate;  Fair Recent;   Fair Remote;   Fair  Judgement:  Fair  Insight:  Present  Psychomotor Activity:  Restlessness  Concentration:  Fair  Recall:  [employer] of Knowledge:Fair  Language: Fair  Akathisia:  No  Handed:  Right  AIMS (if indicated):     Assets:  Desire for Improvement Housing Transportation Vocational/Educational  ADL's:  Intact  Cognition: WNL  Sleep:  Number of Hours: 3.75   Risk to Self: Is patient at risk for suicide?: No Risk to Others:   Prior Inpatient Therapy:  Denies Prior Outpatient Therapy:  Dr. Toy Care depression anxiety, Trazodone 100 mg HS Xanax 1 mg TID Pristiq 50 Remeron 45   Alcohol Screening: 1. How often do you have a drink containing alcohol?: Monthly or less 2. How many drinks containing alcohol do you have on a typical day when you are drinking?: 3 or 4 3. How often do you have six or more drinks on one occasion?: Never Preliminary Score: 1 4. How often during the last year have you found that you were not able to stop drinking once you had started?: Less than monthly 5. How often during the last year have you failed to do what was normally expected from you becasue of drinking?: Never 6.  How often during the last year have you needed a first drink in the morning to get yourself going after a heavy drinking session?: Never 7. How often during the last year have you had a feeling of guilt of remorse after drinking?: Less than monthly 8. How often during the last year have you been unable to remember what happened the night before because you had been drinking?: Less than monthly 9. Have you or someone else been injured as a result of your drinking?: No 10. Has a relative or friend or a doctor or another health worker been concerned about your drinking or suggested you cut down?: Yes, during the last year Alcohol Use Disorder Identification Test Final Score (AUDIT): 9 Brief Intervention: Yes  Allergies:   Allergies  Allergen Reactions  . Penicillins Rash   Lab Results:  Results for orders placed or performed during the hospital encounter of 07/13/15 (from the past 48 hour(s))  Comprehensive metabolic panel     Status: None   Collection Time: 07/13/15 10:18 PM  Result Value Ref Range   Sodium 139 135 - 145 mmol/L   Potassium 4.1 3.5 - 5.1 mmol/L   Chloride 101 101 - 111 mmol/L   CO2 27 22 - 32 mmol/L   Glucose, Bld 95 65 - 99 mg/dL   BUN 15 6 - 20 mg/dL   Creatinine, Ser 0.81 0.44 - 1.00 mg/dL   Calcium 9.6 8.9 - 10.3 mg/dL   Total Protein 8.0 6.5 - 8.1 g/dL   Albumin 4.9 3.5 - 5.0 g/dL   AST 28 15 - 41 U/L   ALT 23 14 - 54 U/L   Alkaline Phosphatase 72 38 - 126 U/L   Total Bilirubin 0.9 0.3 - 1.2 mg/dL   GFR calc non Af Amer >60 >60 mL/min   GFR calc Af Amer >60 >60 mL/min    Comment: (NOTE) The eGFR has been calculated using the CKD EPI equation. This calculation has not been validated in all clinical situations. eGFR's persistently <60 mL/min signify possible Chronic Kidney Disease.    Anion gap 11 5 - 15  Ethanol (ETOH)     Status: None   Collection Time: 07/13/15 10:18 PM  Result  Value Ref Range   Alcohol, Ethyl (B) <5 <5 mg/dL    Comment:        LOWEST  DETECTABLE LIMIT FOR SERUM ALCOHOL IS 5 mg/dL FOR MEDICAL PURPOSES ONLY   Salicylate level     Status: None   Collection Time: 07/13/15 10:18 PM  Result Value Ref Range   Salicylate Lvl <4.4 2.8 - 30.0 mg/dL  Acetaminophen level     Status: Abnormal   Collection Time: 07/13/15 10:18 PM  Result Value Ref Range   Acetaminophen (Tylenol), Serum <10 (L) 10 - 30 ug/mL    Comment:        THERAPEUTIC CONCENTRATIONS VARY SIGNIFICANTLY. A RANGE OF 10-30 ug/mL MAY BE AN EFFECTIVE CONCENTRATION FOR MANY PATIENTS. HOWEVER, SOME ARE BEST TREATED AT CONCENTRATIONS OUTSIDE THIS RANGE. ACETAMINOPHEN CONCENTRATIONS >150 ug/mL AT 4 HOURS AFTER INGESTION AND >50 ug/mL AT 12 HOURS AFTER INGESTION ARE OFTEN ASSOCIATED WITH TOXIC REACTIONS.   CBC     Status: Abnormal   Collection Time: 07/13/15 10:18 PM  Result Value Ref Range   WBC 9.8 4.0 - 10.5 K/uL   RBC 4.85 3.87 - 5.11 MIL/uL   Hemoglobin 15.1 (H) 12.0 - 15.0 g/dL   HCT 46.4 (H) 36.0 - 46.0 %   MCV 95.7 78.0 - 100.0 fL   MCH 31.1 26.0 - 34.0 pg   MCHC 32.5 30.0 - 36.0 g/dL   RDW 13.6 11.5 - 15.5 %   Platelets 354 150 - 400 K/uL  I-Stat beta hCG blood, ED ([facility], [facility], [facility] only)     Status: None   Collection Time: 07/13/15 10:29 PM  Result Value Ref Range   I-stat hCG, quantitative <5.0 <5 mIU/mL   Comment 3            Comment:   GEST. AGE      CONC.  (mIU/mL)   <=1 WEEK        5 - 50     2 WEEKS       50 - 500     3 WEEKS       100 - 10,000     4 WEEKS     1,000 - 30,000        FEMALE AND NON-PREGNANT FEMALE:     LESS THAN 5 mIU/mL   Urine rapid drug screen (hosp performed) (Not at [facility])     Status: Abnormal   Collection Time: 07/14/15 12:12 AM  Result Value Ref Range   Opiates POSITIVE (A) NONE DETECTED   Cocaine NONE DETECTED NONE DETECTED   Benzodiazepines POSITIVE (A) NONE DETECTED   Amphetamines NONE DETECTED NONE DETECTED   Tetrahydrocannabinol NONE DETECTED NONE DETECTED   Barbiturates NONE DETECTED NONE DETECTED     Comment:        DRUG SCREEN FOR MEDICAL PURPOSES ONLY.  IF CONFIRMATION IS NEEDED FOR ANY PURPOSE, NOTIFY LAB WITHIN 5 DAYS.        LOWEST DETECTABLE LIMITS FOR URINE DRUG SCREEN Drug Class       Cutoff (ng/mL) Amphetamine      1000 Barbiturate      200 Benzodiazepine   010 Tricyclics       272 Opiates          300 Cocaine          300 THC              50    Current Medications: Current Facility-Administered Medications  Medication Dose Route Frequency Provider Last Rate  Last Dose  . acetaminophen (TYLENOL) tablet 650 mg  650 mg Oral Q6H PRN Nicholaus Bloom, MD      . albuterol (PROVENTIL HFA;VENTOLIN HFA) 108 (90 BASE) MCG/ACT inhaler 2 puff  2 puff Inhalation Q6H PRN Laverle Hobby, PA-C   2 puff at 07/14/15 0809  . alum & mag hydroxide-simeth (MAALOX/MYLANTA) 200-200-20 MG/5ML suspension 30 mL  30 mL Oral Q4H PRN Nicholaus Bloom, MD      . magnesium hydroxide (MILK OF MAGNESIA) suspension 30 mL  30 mL Oral Daily PRN Nicholaus Bloom, MD      . traZODone (DESYREL) tablet 50 mg  50 mg Oral QHS PRN,MR X 1 Laverle Hobby, PA-C       PTA Medications: Prescriptions prior to admission  Medication Sig Dispense Refill Last Dose  . albuterol (PROVENTIL) (5 MG/ML) 0.5% nebulizer solution Take 5 mg by nebulization every 6 (six) hours as needed. For wheeze and shortness of breath   07/13/2015 at Unknown time  . ALPRAZolam (XANAX) 0.5 MG tablet Take 1 mg by mouth 3 (three) times daily as needed for anxiety. For anxiety   07/12/2015 at Unknown time  . azithromycin (ZITHROMAX Z-PAK) 250 MG tablet Take as directed (Patient not taking: Reported on 07/13/2015) 6 each 0 Completed Course at Unknown time  . beclomethasone (QVAR) 80 MCG/ACT inhaler Inhale 1 puff into the lungs 2 (two) times daily. 1 Inhaler 6 07/13/2015 at Unknown time  . budesonide-formoterol (SYMBICORT) 160-4.5 MCG/ACT inhaler Inhale 2 puffs into the lungs 2 (two) times daily. 1 Inhaler 6 07/13/2015 at Unknown time  . cetirizine (ZYRTEC) 10  MG tablet Take 10 mg by mouth daily.   Not Taking at Unknown time  . cyclobenzaprine (FLEXERIL) 10 MG tablet Take 1 tablet (10 mg total) by mouth at bedtime. (Patient not taking: Reported on 07/13/2015) 30 tablet 0 Not Taking at Unknown time  . desvenlafaxine (PRISTIQ) 50 MG 24 hr tablet Take 50 mg by mouth daily.   07/13/2015 at Unknown time  . EPINEPHrine 0.3 mg/0.3 mL IJ SOAJ injection Inject 0.3 mLs (0.3 mg total) into the muscle once. 1 Device 11 PRN  . esomeprazole (NEXIUM) 40 MG capsule Take 40 mg by mouth daily at 12 noon.   Past Week at Unknown time  . HYDROcodone-homatropine (HYCODAN) 5-1.5 MG/5ML syrup Take 5 mLs by mouth every 6 (six) hours as needed for cough. 240 mL 0 07/12/2015 at Unknown time  . levonorgestrel (MIRENA) 20 MCG/24HR IUD 1 each by Intrauterine route once.   11/2014  . mirtazapine (REMERON) 15 MG tablet Take 45 mg by mouth at bedtime.    Past Week at Unknown time  . montelukast (SINGULAIR) 10 MG tablet Take 1 tablet (10 mg total) by mouth at bedtime. 30 tablet 6 Past Week at Unknown time  . predniSONE (DELTASONE) 10 MG tablet Take 2 tablets with food x 5 days, then 1 tablet x 5 days , then STOP (Patient not taking: Reported on 07/13/2015) 15 tablet 0 Completed Course at Unknown time  . predniSONE (DELTASONE) 20 MG tablet Take 20 mg by mouth 3 (three) times daily. For 5 days   07/12/2015 at Unknown time  . PROAIR HFA 108 (90 BASE) MCG/ACT inhaler INHALE 2 PUFFS INTO THE LUNGS EVERY 4 HOURS AS NEEDED (FOR SHORTNESS OF BREATH). 8.5 g 5 07/13/2015 at Unknown time  . traZODone (DESYREL) 100 MG tablet Take 100 mg by mouth at bedtime as needed for sleep.   Past Week at  Unknown time    Previous Psychotropic Medications: Yes Celexa Wellbutrin Lexapro   Substance Abuse History in the last 12 months:  Yes.      Consequences of Substance Abuse: Legal Consequences:  a DWI 3 years ago, she stop went to AA util now Blackouts:    Results for orders placed or performed during the  hospital encounter of 07/13/15 (from the past 72 hour(s))  Comprehensive metabolic panel     Status: None   Collection Time: 07/13/15 10:18 PM  Result Value Ref Range   Sodium 139 135 - 145 mmol/L   Potassium 4.1 3.5 - 5.1 mmol/L   Chloride 101 101 - 111 mmol/L   CO2 27 22 - 32 mmol/L   Glucose, Bld 95 65 - 99 mg/dL   BUN 15 6 - 20 mg/dL   Creatinine, Ser 0.81 0.44 - 1.00 mg/dL   Calcium 9.6 8.9 - 10.3 mg/dL   Total Protein 8.0 6.5 - 8.1 g/dL   Albumin 4.9 3.5 - 5.0 g/dL   AST 28 15 - 41 U/L   ALT 23 14 - 54 U/L   Alkaline Phosphatase 72 38 - 126 U/L   Total Bilirubin 0.9 0.3 - 1.2 mg/dL   GFR calc non Af Amer >60 >60 mL/min   GFR calc Af Amer >60 >60 mL/min    Comment: (NOTE) The eGFR has been calculated using the CKD EPI equation. This calculation has not been validated in all clinical situations. eGFR's persistently <60 mL/min signify possible Chronic Kidney Disease.    Anion gap 11 5 - 15  Ethanol (ETOH)     Status: None   Collection Time: 07/13/15 10:18 PM  Result Value Ref Range   Alcohol, Ethyl (B) <5 <5 mg/dL    Comment:        LOWEST DETECTABLE LIMIT FOR SERUM ALCOHOL IS 5 mg/dL FOR MEDICAL PURPOSES ONLY   Salicylate level     Status: None   Collection Time: 07/13/15 10:18 PM  Result Value Ref Range   Salicylate Lvl <0.9 2.8 - 30.0 mg/dL  Acetaminophen level     Status: Abnormal   Collection Time: 07/13/15 10:18 PM  Result Value Ref Range   Acetaminophen (Tylenol), Serum <10 (L) 10 - 30 ug/mL    Comment:        THERAPEUTIC CONCENTRATIONS VARY SIGNIFICANTLY. A RANGE OF 10-30 ug/mL MAY BE AN EFFECTIVE CONCENTRATION FOR MANY PATIENTS. HOWEVER, SOME ARE BEST TREATED AT CONCENTRATIONS OUTSIDE THIS RANGE. ACETAMINOPHEN CONCENTRATIONS >150 ug/mL AT 4 HOURS AFTER INGESTION AND >50 ug/mL AT 12 HOURS AFTER INGESTION ARE OFTEN ASSOCIATED WITH TOXIC REACTIONS.   CBC     Status: Abnormal   Collection Time: 07/13/15 10:18 PM  Result Value Ref Range   WBC 9.8  4.0 - 10.5 K/uL   RBC 4.85 3.87 - 5.11 MIL/uL   Hemoglobin 15.1 (H) 12.0 - 15.0 g/dL   HCT 46.4 (H) 36.0 - 46.0 %   MCV 95.7 78.0 - 100.0 fL   MCH 31.1 26.0 - 34.0 pg   MCHC 32.5 30.0 - 36.0 g/dL   RDW 13.6 11.5 - 15.5 %   Platelets 354 150 - 400 K/uL  I-Stat beta hCG blood, ED ([facility], [facility], [facility] only)     Status: None   Collection Time: 07/13/15 10:29 PM  Result Value Ref Range   I-stat hCG, quantitative <5.0 <5 mIU/mL   Comment 3            Comment:   GEST. AGE  CONC.  (mIU/mL)   <=1 WEEK        5 - 50     2 WEEKS       50 - 500     3 WEEKS       100 - 10,000     4 WEEKS     1,000 - 30,000        FEMALE AND NON-PREGNANT FEMALE:     LESS THAN 5 mIU/mL   Urine rapid drug screen (hosp performed) (Not at [facility])     Status: Abnormal   Collection Time: 07/14/15 12:12 AM  Result Value Ref Range   Opiates POSITIVE (A) NONE DETECTED   Cocaine NONE DETECTED NONE DETECTED   Benzodiazepines POSITIVE (A) NONE DETECTED   Amphetamines NONE DETECTED NONE DETECTED   Tetrahydrocannabinol NONE DETECTED NONE DETECTED   Barbiturates NONE DETECTED NONE DETECTED    Comment:        DRUG SCREEN FOR MEDICAL PURPOSES ONLY.  IF CONFIRMATION IS NEEDED FOR ANY PURPOSE, NOTIFY LAB WITHIN 5 DAYS.        LOWEST DETECTABLE LIMITS FOR URINE DRUG SCREEN Drug Class       Cutoff (ng/mL) Amphetamine      1000 Barbiturate      200 Benzodiazepine   263 Tricyclics       785 Opiates          300 Cocaine          300 THC              50     Observation Level/Precautions:  15 minute checks  Laboratory:  As per the ED  Psychotherapy:  Individual/group  Medications:  Will resume her psychotropics ( she has been on Xanax 1 mg TID for years, her MD does not plan to take her off)  Consultations:    Discharge Concerns:    Estimated LOS: 3-5 days  Other:     Psychological Evaluations: No   Treatment Plan Summary: Daily contact with patient to assess and evaluate symptoms and progress in treatment and  Medication management Supportive approach/coping skills Alcohol abuse; work a relapse prevention plan Anxiety; continue the Xanax 1 mg TID as per Dr. Toy Care, she has been on the Xanax for years. Dr. Toy Care has no plans of taking her off. Depression; continue the Pristiq/Remeron combination Trauma help process the event that led to the relapse   Medical Decision Making:  Review of Psycho-Social Stressors (1), Review or order clinical lab tests (1), Review of Medication Regimen & Side Effects (2) and Review of New Medication or Change in Dosage (2)  I certify that inpatient services furnished can reasonably be expected to improve the patient's condition.   Geraldina Parrott A 8/17/201610:59 AM

## 2015-07-14 NOTE — Progress Notes (Signed)
Pt attended NA group this evening.  

## 2015-07-14 NOTE — [facility] Group Notes (Signed)
[facility] LCSW Group Therapy 07/14/2015 1:15 PM Type of Therapy: Group Therapy Participation Level: Active  Participation Quality: Attentive, Sharing and Supportive  Affect: Tearful  Cognitive: Alert and Oriented  Insight: Developing/Improving and Engaged  Engagement in Therapy: Developing/Improving and Engaged  Modes of Intervention: Clarification, Confrontation, Discussion, Education, Exploration, Limit-setting, Orientation, Problem-solving, Rapport Building, Dance movement psychotherapist, Socialization and Support  Summary of Progress/Problems: The topic for group today was emotional regulation. This group focused on both positive and negative emotion identification and allowed group members to process ways to identify feelings, regulate negative emotions, and find healthy ways to manage internal/external emotions. Group members were asked to reflect on a time when their reaction to an emotion led to a negative outcome and explored how alternative responses using emotion regulation would have benefited them. Group members were also asked to discuss a time when emotion regulation was utilized when a negative emotion was experienced. Patient participated in discussion of emotional stressors, warning signs, and coping strategies. CSW reviewed cognitive triangle with group and patient participated in working through various scenarios.   Samuella Bruin, MSW, [employer] Clinical Social Worker [facility] [phone]

## 2015-07-14 NOTE — [facility] Counselor (Signed)
Adult Comprehensive Assessment  Patient ID: Kelsey [name], female   DOB: 20-May-1981, 34 y.o.   MRN: [id number]  Information Source: Information source: Patient  Current Stressors:  Educational / Learning stressors: none  Employment / Job issues: none  Substance abuse: relapsed one month ago on alcohol. gradually started drinking more until Sunday when she decided to get help.  Bereavement / Loss: pt reports that a young patient of hers died which triggered relapse.   Living/Environment/Situation:  Living Arrangements: Children, Spouse/significant other Living conditions (as described by patient or guardian): supportive, healthy and clean environment. lives with husband and 2 children How long has patient lived in current situation?: 12 years  What is atmosphere in current home: Comfortable, Paramedic, Supportive  Family History:  Marital status: Married Number of Years Married: 12 What types of issues is patient dealing with in the relationship?: pt reports that he is supportive and loving Additional relationship information: n/a  Does patient have children?: Yes How many children?: 2 How is patient's relationship with their children?: pt has 5 year old son and 96 year old daughter. pt reports that she decided to get help when her son saw her intoxicated and stated that he could no longer trust her. pt tearful when talking about this incident.   Childhood History:  By whom was/is the patient raised?: Both parents Additional childhood history information: mom and dad married and supportive of patient.  Description of patient's relationship with caregiver when they were a child: close to both parents Patient's description of current relationship with people who raised him/her: still close to both parents  Does patient have siblings?: Yes Number of Siblings: 1 Description of patient's current relationship with siblings: brother. close to brother.  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 How long has patient been employed?: several years Patient's job has been impacted by current illness: Yes Describe how patient's job has been impacted: pt reports that a patient died last month which triggered relapse (stress and grief related) What is the longest time patient has a held a job?: see above  Where was the patient employed at that time?: see above  Has patient ever been in the [employer]?: No Has patient ever served in combat?: No  Financial Resources:   Financial resources: Income from employment, Support from parents / caregiver, Private insurance Does patient have a representative payee or guardian?: No  Alcohol/Substance Abuse:   What has been your use of drugs/alcohol within the last 12 months?: daily alcohol (wine) abuse for the past month after 3 1/2 years of sobriety. smoked marijuana last week when she was at the beach but reports no other drug abuse.  If attempted suicide, did drugs/alcohol play a role in this?: No Alcohol/Substance Abuse Treatment Hx: Denies past history, Past Tx, Outpatient If yes, describe treatment: Dr. Evelene Croon for med managment. (not for s/a services)  Has alcohol/substance abuse ever caused legal problems?: Yes (3 /12 years ago, pt got DUI )  Social Support System:   Patient's Community Support System: Good Describe Community Support System: family, coworkers and friends  are supportive  Type of faith/religion: n/a  How does patient's faith help to cope with  current illness?: n/a   Leisure/Recreation:   Leisure and Hobbies: reading, spending time with her family  Strengths/Needs:   What things does the patient do well?: hard worker, motivated to get sober for herself and family, insightful In what areas does patient struggle / problems for patient: stress; coping skills, hx of depression  Discharge Plan:   Does patient have access to transportation?: Yes (pt has a car and license) Will patient be returning to same living situation after discharge?: Yes (home with family) Currently receiving community mental health services: Yes (From Whom) (Dr. Evelene Croon) If no, would patient like referral for services when discharged?: Yes (What county?) ([place]. pt interested in counseling offered for free to [employer] [facility]. [facility] program ) Does patient have financial barriers related to discharge medications?: No  Summary/Recommendations:    Pt is 34 year old female with history of depression and alcohol abuse admitted voluntarily for ETOH detox, medication management, and depression. Pt reports that she was sober for 3 1/2 years and relapsed last month, drinking several glasses of wine per night. Pt reports that she works as a Forensic scientist for [employer] and is interested in the [facility] program for family counseling and would like to continue seeing Dr. Evelene Croon for med management. Recommendations for pt include: crisis stabilization, therapeutic milieu, encourage group attendance and participation, medication management for mood stabilization, and development of comprehensive mental wellness/sobriety plan.   Smart, [place] LCSWA 07/14/2015

## 2015-07-14 NOTE — Tx Team (Signed)
Initial Interdisciplinary Treatment Plan   PATIENT STRESSORS: Occupational concerns Traumatic event   PATIENT STRENGTHS: Ability for insight Average or above average intelligence Capable of independent living Metallurgist fund of knowledge Motivation for treatment/growth Supportive family/friends   PROBLEM LIST: Problem List/Patient Goals Date to be addressed Date deferred Reason deferred Estimated date of resolution  "Prove to my son that I am getting better". 07/14/2015     "Get myself back together" 07/14/2015     Depression 07/14/2015     Alcohol abuse/Substance abuse 07/14/2015                                    DISCHARGE CRITERIA:  Ability to meet basic life and health needs Improved stabilization in mood, thinking, and/or behavior Motivation to continue treatment in a less acute level of care Safe-care adequate arrangements made Verbal commitment to aftercare and medication compliance  PRELIMINARY DISCHARGE PLAN: Attend aftercare/continuing care group Attend 12-step recovery group Return to previous living arrangement Return to previous work or school arrangements  PATIENT/FAMIILY INVOLVEMENT: This treatment plan has been presented to and reviewed with the patient, Sandie Ano.  The patient and family have been given the opportunity to ask questions and make suggestions.  Claiborne Rigg 07/14/2015, 2:38 AM

## 2015-07-14 NOTE — Progress Notes (Signed)
Recreation Therapy Notes  Date: 08.17.2016 Time: 9:30am Location: [address] Group room   Group Topic: Stress Management  Goal Area(s) Addresses:  Patient will actively participate in stress management techniques presented during session.   Behavioral Response: Did not attend.   Naleah Kofoed L Marquett Bertoli, LRT/CTRS        Trenia Tennyson L 07/14/2015 3:04 PM 

## 2015-07-15 NOTE — Progress Notes (Signed)
D: Pt has depressed affect and depressed, anxious mood.  When asked how her day was, pt stated "up and down, better than what I thought it would be."  Pt reports her goal today is to "feel better, try to get a routine going."  She reports she "was doing well 'til I just talked to my son.  Now I don't feel so great."  Pt looked like she was about to cry when telling writer this.  Pt denies SI/HI, denies hallucinations, denies pain, denies withdrawal symptoms.  Pt has been visible in milieu interacting with peers and staff appropriately.  Pt attended evening group.   A: Introduced self to pt.  Met with pt 1:1 and provided support and encouragement.  Actively listened to pt.  Medications administered per order.  PRN medication administered for anxiety and sleep. R: Pt is compliant with medications.  Pt verbally contracts for safety and reports that she will notify staff of needs and concerns.  Will continue to monitor and assess.

## 2015-07-15 NOTE — Progress Notes (Signed)
Patient attended Karaoke group tonight and participated.

## 2015-07-15 NOTE — Progress Notes (Signed)
D: Pt presents anxious in affect and mood. Pt endorses anxiety and feeling "shaky" in regards to her anxiety. Pt was negative for any SI/HI/AVH. Pt is visible and active within the milieu.  A: Writer administered scheduled medications to pt, per MD orders. Continued support and availability as needed was extended to this pt. Staff continue to monitor pt with q76min checks.  R: No adverse drug reactions noted. Pt receptive to treatment. Pt remains safe at this time.

## 2015-07-15 NOTE — [facility] Group Notes (Signed)
[facility] LCSW Group Therapy  07/15/2015 12:22 PM  Type of Therapy:  Group Therapy  Participation Level:  Active  Participation Quality:  Attentive  Affect:  Appropriate  Cognitive:  Alert and Oriented  Insight:  Improving  Engagement in Therapy:  Engaged  Modes of Intervention:  Confrontation, Discussion, Education, Exploration, Problem-solving, Rapport Building, Socialization and Support  Summary of Progress/Problems: [name] came to talk about his personal journey with substance abuse and addiction. The pt processed ways by which to relate to the speaker. [name] provided handouts and educational information pertaining to groups and services offered by the [facility].   Smart, Kelsey [name] LCSWA  07/15/2015, 12:22 PM

## 2015-07-15 NOTE — Progress Notes (Signed)
Nutrition Education Note  RD led a group providing general, healthful nutrition education.  RD emphasized the importance of eating regular meals and snacks throughout the day. Consuming sugar-free beverages and incorporating fruits and vegetables into diet when possible. Provided examples of healthy snacks. Encouraged physical activity for at least 60 minutes a day. Patient encouraged to leave group with a goal to improve nutrition/healthy eating.   Expect good compliance.  Diet Order: Diet regular Room service appropriate?: Yes; Fluid consistency:: Thin Pt is also offered choice of unit snacks mid-morning and mid-afternoon.  Pt is eating as desired.   Labs and medications reviewed. If additional nutrition issues arise, please consult RD.  Harshika Mago, MS, RD, LDN Pager: [phone] After Hours Pager: [phone]    

## 2015-07-15 NOTE — [facility] Group Notes (Signed)
[facility] Group Notes:  (Nursing/MHT/Case Management/Adjunct)  Date:  07/15/2015  Time:  12:37 PM  Type of Therapy:  Nurse Education / Leisure Activities : the group is focused on teaching patients the importance of incorporating daily leisure activities into our lives and how these activities can impact our recovery positively.  Participation Level:  Active  Participation Quality:  Appropriate  Affect:  Appropriate  Cognitive:  Appropriate  Insight:  Good  Engagement in Group:  Engaged  Modes of Intervention:  Discussion  Summary of Progress/Problems:  Kelsey [name] 07/15/2015, 12:37 PM

## 2015-07-15 NOTE — Progress Notes (Signed)
Kelsey [name]  07/15/2015 5:58 PM Kelsey [name]  MRN:  [id number] Subjective:  Kelsey [name] states she is feeling better. She was able to talk to her son and he was loving towards her asking when was she coming back home. States this was a relief as she felt so bad and full of shame and guilt for allowing herself to relapse. She is going to be involved in therapy and would like for all of them to go as a family. She admits she needs to find better ways of dealing with stress Principal Problem: Depression, major, recurrent, moderate Diagnosis:   Patient Active Problem List   Diagnosis Date Noted  . Alcohol abuse [F10.10] 07/14/2015  . GAD (generalized anxiety disorder) [F41.1] 07/14/2015  . Depression, major, recurrent, moderate [F33.1] 07/14/2015  . Periscapular pain [M89.8X1] 01/19/2015  . Left-sided thoracic back pain-greater than 20 years duration [M54.6] 01/12/2015  . GERD (gastroesophageal reflux disease) [K21.9] 08/22/2013  . Asthma exacerbation [J45.901] 06/13/2012  . 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 Abnormal ECG- T  ST changes in V3 through V5 [R94.31] 05/02/2011   Total Time spent with patient: 30 minutes   Past Medical History:  Past Medical History  Diagnosis Date  . PVC (premature ventricular contraction)   . Palpitations   . Pre-syncope   . Tachycardia   . Depression   . Insomnia   . Seasonal allergies   . Asthma   . Anxiety   . Depression   . Insomnia   . GERD (gastroesophageal reflux disease)     Past Surgical History  Procedure Laterality Date  . Cesarean section  10/15/2009, 02/08/2006    times 2   Family History:  Family History  Problem Relation Age of Onset  . Heart disease Father   . Rheumatologic disease Mother   . Lung cancer Paternal Grandmother    Social History:  History  Alcohol Use  . 12.6 oz/week  . 21 Glasses of wine, 0 Standard drinks or equivalent per week    Comment: pt states 2-3 glasses of  wine per day     History  Drug Use  . Yes  . Special: Marijuana    Social History   Social History  . Marital Status: Married    Spouse Name: N/A  . Number of Children: 2  . 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 Smoking status: Never Smoker   . Smokeless tobacco: Never Used     Comment: reports "tried it as a teenager" but has not smoked since  . Alcohol Use: 12.6 oz/week    21 Glasses of wine, 0 Standard drinks or equivalent per week     Comment: pt states 2-3 glasses of wine per day  . Drug Use: Yes    Special: Marijuana  . Sexual Activity: Yes    Birth Control/ Protection: IUD   Other Topics Concern  . None   Social History Narrative   Additional History:    Sleep: Fair  Appetite:  Fair   Assessment:   Musculoskeletal: Strength & Muscle Tone: within normal limits Gait & Station: normal Patient leans: normal   Psychiatric Specialty Exam: Physical Exam  Review of Systems  Constitutional: Negative.   HENT: Negative.   Eyes: Negative.   Respiratory: Negative.   Cardiovascular: Negative.   Gastrointestinal: Negative.   Genitourinary: Negative.   Musculoskeletal: Negative.  Skin: Negative.   Neurological: Negative.   Endo/Heme/Allergies: Negative.   Psychiatric/Behavioral: Positive for depression and substance abuse. The patient is nervous/anxious.     Blood pressure 130/87, pulse 90, temperature 97.3 F (36.3 C), temperature source Oral, resp. rate 18, height _0  (1.626 m), weight 78.926 kg (174 lb).Body mass index is 29.85 kg/(m^2).  General Appearance: Fairly Groomed  Engineer, water::  Fair  Speech:  Clear and Coherent  Volume:  Normal  Mood:  Anxious  Affect:  anxious worried  Thought Process:  Coherent and Goal Directed  Orientation:  Full (Time, Place, and Person)  Thought Content:  symptoms events worries concerns  Suicidal Thoughts:  No  Homicidal Thoughts:   No  Memory:  Immediate;   Fair Recent;   Fair Remote;   Fair  Judgement:  Fair  Insight:  Present  Psychomotor Activity:  Restlessness  Concentration:  Fair  Recall:  [employer] of Knowledge:Fair  Language: Fair  Akathisia:  No  Handed:  Right  AIMS (if indicated):     Assets:  Desire for Improvement Housing Social Support Vocational/Educational  ADL's:  Intact  Cognition: WNL  Sleep:  Number of Hours: 3.75     Current Medications: Current Facility-Administered Medications  Medication Dose Route Frequency Provider Last Rate Last Dose  . acetaminophen (TYLENOL) tablet 650 mg  650 mg Oral Q6H PRN Nicholaus Bloom, MD      . albuterol (PROVENTIL HFA;VENTOLIN HFA) 108 (90 BASE) MCG/ACT inhaler 2 puff  2 puff Inhalation Q4H PRN Nicholaus Bloom, MD      . ALPRAZolam Duanne Moron) tablet 1 mg  1 mg Oral TID PRN Nicholaus Bloom, MD   1 mg at 07/15/15 1622  . alum & mag hydroxide-simeth (MAALOX/MYLANTA) 200-200-20 MG/5ML suspension 30 mL  30 mL Oral Q4H PRN Nicholaus Bloom, MD      . beclomethasone (QVAR) 80 MCG/ACT inhaler 1 puff  1 puff Inhalation BID Nicholaus Bloom, MD   1 puff at 07/15/15 1623  . budesonide-formoterol (SYMBICORT) 160-4.5 MCG/ACT inhaler 2 puff  2 puff Inhalation BID Nicholaus Bloom, MD   2 puff at 07/15/15 1623  . magnesium hydroxide (MILK OF MAGNESIA) suspension 30 mL  30 mL Oral Daily PRN Nicholaus Bloom, MD      . mirtazapine (REMERON) tablet 45 mg  45 mg Oral QHS Nicholaus Bloom, MD   45 mg at 07/14/15 2135  . montelukast (SINGULAIR) tablet 10 mg  10 mg Oral QHS Nicholaus Bloom, MD   10 mg at 07/14/15 2135  . pantoprazole (PROTONIX) EC tablet 40 mg  40 mg Oral Daily Nicholaus Bloom, MD   40 mg at 07/15/15 0631  . predniSONE (DELTASONE) tablet 20 mg  20 mg Oral TID Minda Ditto, RPH   20 mg at 07/15/15 1308  . traZODone (DESYREL) tablet 200 mg  200 mg Oral QHS PRN Nicholaus Bloom, MD   200 mg at 07/14/15 2135    Lab Results:  Results for orders placed or performed during the hospital  encounter of 07/13/15 (from the past 48 hour(s))  Comprehensive metabolic panel     Status: None   Collection Time: 07/13/15 10:18 PM  Result Value Ref Range   Sodium 139 135 - 145 mmol/L   Potassium 4.1 3.5 - 5.1 mmol/L   Chloride 101 101 - 111 mmol/L   CO2 27 22 - 32 mmol/L   Glucose, Bld 95 65 - 99 mg/dL  BUN 15 6 - 20 mg/dL   Creatinine, Ser 0.81 0.44 - 1.00 mg/dL   Calcium 9.6 8.9 - 10.3 mg/dL   Total Protein 8.0 6.5 - 8.1 g/dL   Albumin 4.9 3.5 - 5.0 g/dL   AST 28 15 - 41 U/L   ALT 23 14 - 54 U/L   Alkaline Phosphatase 72 38 - 126 U/L   Total Bilirubin 0.9 0.3 - 1.2 mg/dL   GFR calc non Af Amer >60 >60 mL/min   GFR calc Af Amer >60 >60 mL/min    Comment: ([name]) The eGFR has been calculated using the CKD EPI equation. This calculation has not been validated in all clinical situations. eGFR's persistently <60 mL/min signify possible Chronic Kidney Disease.    Anion gap 11 5 - 15  Ethanol (ETOH)     Status: None   Collection Time: 07/13/15 10:18 PM  Result Value Ref Range   Alcohol, Ethyl (B) <5 <5 mg/dL    Comment:        LOWEST DETECTABLE LIMIT FOR SERUM ALCOHOL IS 5 mg/dL FOR MEDICAL PURPOSES ONLY   Salicylate level     Status: None   Collection Time: 07/13/15 10:18 PM  Result Value Ref Range   Salicylate Lvl <6.2 2.8 - 30.0 mg/dL  Acetaminophen level     Status: Abnormal   Collection Time: 07/13/15 10:18 PM  Result Value Ref Range   Acetaminophen (Tylenol), Serum <10 (L) 10 - 30 ug/mL    Comment:        THERAPEUTIC CONCENTRATIONS VARY SIGNIFICANTLY. A RANGE OF 10-30 ug/mL MAY BE AN EFFECTIVE CONCENTRATION FOR MANY PATIENTS. HOWEVER, SOME ARE BEST TREATED AT CONCENTRATIONS OUTSIDE THIS RANGE. ACETAMINOPHEN CONCENTRATIONS >150 ug/mL AT 4 HOURS AFTER INGESTION AND >50 ug/mL AT 12 HOURS AFTER INGESTION ARE OFTEN ASSOCIATED WITH TOXIC REACTIONS.   CBC     Status: Abnormal   Collection Time: 07/13/15 10:18 PM  Result Value Ref Range   WBC 9.8 4.0 - 10.5  K/uL   RBC 4.85 3.87 - 5.11 MIL/uL   Hemoglobin 15.1 (H) 12.0 - 15.0 g/dL   HCT 46.4 (H) 36.0 - 46.0 %   MCV 95.7 78.0 - 100.0 fL   MCH 31.1 26.0 - 34.0 pg   MCHC 32.5 30.0 - 36.0 g/dL   RDW 13.6 11.5 - 15.5 %   Platelets 354 150 - 400 K/uL  I-Stat beta hCG blood, ED ([facility], [facility], [facility] only)     Status: None   Collection Time: 07/13/15 10:29 PM  Result Value Ref Range   I-stat hCG, quantitative <5.0 <5 mIU/mL   Comment 3            Comment:   GEST. AGE      CONC.  (mIU/mL)   <=1 WEEK        5 - 50     2 WEEKS       50 - 500     3 WEEKS       100 - 10,000     4 WEEKS     1,000 - 30,000        FEMALE AND NON-PREGNANT FEMALE:     LESS THAN 5 mIU/mL   Urine rapid drug screen (hosp performed) (Not at [facility])     Status: Abnormal   Collection Time: 07/14/15 12:12 AM  Result Value Ref Range   Opiates POSITIVE (A) NONE DETECTED   Cocaine NONE DETECTED NONE DETECTED   Benzodiazepines POSITIVE (A) NONE DETECTED  Amphetamines NONE DETECTED NONE DETECTED   Tetrahydrocannabinol NONE DETECTED NONE DETECTED   Barbiturates NONE DETECTED NONE DETECTED    Comment:        DRUG SCREEN FOR MEDICAL PURPOSES ONLY.  IF CONFIRMATION IS NEEDED FOR ANY PURPOSE, NOTIFY LAB WITHIN 5 DAYS.        LOWEST DETECTABLE LIMITS FOR URINE DRUG SCREEN Drug Class       Cutoff (ng/mL) Amphetamine      1000 Barbiturate      200 Benzodiazepine   997 Tricyclics       182 Opiates          300 Cocaine          300 THC              50     Physical Findings: AIMS: Facial and Oral Movements Muscles of Facial Expression: None, normal Lips and Perioral Area: None, normal Jaw: None, normal Tongue: None, normal,Extremity Movements Upper (arms, wrists, hands, fingers): None, normal Lower (legs, knees, ankles, toes): None, normal, Trunk Movements Neck, shoulders, hips: None, normal, Overall Severity Severity of abnormal movements (highest score from questions above): None, normal Incapacitation due to abnormal  movements: None, normal Patient's awareness of abnormal movements (rate only patient's report): No Awareness, Dental Status Current problems with teeth and/or dentures?: No Does patient usually wear dentures?: Yes (upper bridge)  CIWA:  CIWA-Ar Total: 3 COWS:  COWS Total Score: 3  Treatment Plan Summary: Daily contact with patient to assess and evaluate symptoms and progress in treatment and Medication management Supportive approach/coping skills Alcohol abuse; work a relapse prevention plan Anxiety; will continue the Xanax 1 mg TID, as she has taken Xanax for years she is going to ask to Dr. Toy Care when she sees her on Tuesday to start a slow weaning process on an outpatient basis Depression; continue the Pristiq/Remeron combination Will work with CBT/mindfulness/stress management strategies   Medical Decision Making:  Review of Psycho-Social Stressors (1) and Review of Medication Regimen & Side Effects (2)     Eilidh Marcano A 07/15/2015, 5:58 PM

## 2015-07-15 NOTE — [facility] Suicide Risk Assessment (Addendum)
[facility] INPATIENT:  Family/Significant Other Suicide Prevention Education  Suicide Prevention Education:  Contact Attempts: Jamison Neighbor (p'ts husband) [phone] has been identified by the patient as the family member/significant other with whom the patient will be residing, and identified as the person(s) who will aid the patient in the event of a mental health crisis.  With written consent from the patient, two attempts were made to provide suicide prevention education, prior to and/or following the patient's discharge.  We were unsuccessful in providing suicide prevention education.  A suicide education pamphlet was given to the patient to share with family/significant other.  Date and time of first attempt: 07/14/15 at 4:00PM (voicemail left requesting call back)  Date and time of second attempt: 07/15/15 at 10:45AM (voicemail left requesting call back)   Smart, Lebron Quam 07/15/2015, 10:49 AM   Pt's husband Gerlene Burdock called CSW. Above information reviewed and aftercare plan reviewed. He reports no concerns regarding pt's tentative Friday discharge.   Trula Slade, LCSWA Clinical Social Worker 07/15/2015 12:38 PM

## 2015-07-15 NOTE — Plan of Care (Signed)
Problem: Alteration in mood Goal: LTG-Patient reports reduction in suicidal thoughts (Patient reports reduction in suicidal thoughts and is able to verbalize a safety plan for whenever patient is feeling suicidal)  Outcome: Progressing Pt denied SI tonight.  She verbally contracts for safety.    Problem: Alteration in mood & ability to function due to Goal: STG-Patient will attend groups Outcome: Progressing Pt attended evening group on 07/14/15.

## 2015-07-15 NOTE — Progress Notes (Signed)
Kelsey [name] remains quite anxious and nervous and uncomfortable,a s she is seen out in the milieu this morning. She shares that she slept " ok" l;ast night, but does not look like it. She completes her daily assessment and on it she writes she denies SI and she rates her depression,, hopelessness and anxiety  " 3/0/4", respectively. She is flat, sad and nervous upon appearance.    A She attends her groups and is engaged in her recovery as evidenced by her verbalizing ways shes going to try to be healthier at DC..   R She has been given 2 prn doses of xanax today, both times for " my anxiety" and stated relief afterwards.

## 2015-07-16 MED ORDER — LEVONORGESTREL 20 MCG/24HR IU IUD: 1.0000 | INTRAUTERINE_SYSTEM | Freq: Once | INTRAUTERINE | Status: AC

## 2015-07-16 MED ORDER — ALBUTEROL SULFATE HFA 108 (90 BASE) MCG/ACT IN AERS: INHALATION_SPRAY | RESPIRATORY_TRACT | Status: DC

## 2015-07-16 MED ORDER — TRAZODONE HCL 100 MG PO TABS: 200.0000 mg | ORAL_TABLET | Freq: Every evening | ORAL | Status: DC | PRN

## 2015-07-16 MED ORDER — BECLOMETHASONE DIPROPIONATE 80 MCG/ACT IN AERS: 1.0000 | INHALATION_SPRAY | Freq: Two times a day (BID) | RESPIRATORY_TRACT | Status: DC

## 2015-07-16 MED ORDER — MONTELUKAST SODIUM 10 MG PO TABS: 10.0000 mg | ORAL_TABLET | Freq: Every day | ORAL | Status: DC

## 2015-07-16 MED ORDER — BUDESONIDE-FORMOTEROL FUMARATE 160-4.5 MCG/ACT IN AERO: 2.0000 | INHALATION_SPRAY | Freq: Two times a day (BID) | RESPIRATORY_TRACT | Status: DC

## 2015-07-16 MED ORDER — PANTOPRAZOLE SODIUM 40 MG PO TBEC: 40.0000 mg | DELAYED_RELEASE_TABLET | Freq: Every day | ORAL | Status: DC

## 2015-07-16 MED ORDER — MIRTAZAPINE 45 MG PO TABS: 45.0000 mg | ORAL_TABLET | Freq: Every day | ORAL | Status: DC

## 2015-07-16 NOTE — Progress Notes (Signed)
  [facility] Adult Case Management Discharge Plan :  Will you be returning to the same living situation after discharge:  Yes,  home with family At discharge, do you have transportation home?: Yes,  mother coming after lunch Do you have the ability to pay for your medications: Yes,  [facility]  Release of information consent forms completed and submitted to medical records by CSW.  Patient to Follow up at: Follow-up Information    Follow up with [facility] On 07/20/2015.   Why:  Appt with Dr. Evelene Croon on this date at 6:00PM for medication management/hospital follow-up.    Contact information:   ATTN: Dr. Evelene Croon  [address] [place], [place] [address] Phone: [phone] Fax: [phone]      Follow up with [facility] ([facility])-Counseling.   Why:  Please contact [facility] at discharge to schedule family session. Thank you.    Contact information:   Glendell Docker [address]  [place], [place] [address] Phone: [phone]      Patient denies SI/HI: Yes,  during group/self report.     Safety Planning and Suicide Prevention discussed: Yes,  SPE completed with pt's husband.  Have you used any form of tobacco in the last 30 days? (Cigarettes, Smokeless Tobacco, Cigars, and/or Pipes): No  Has patient been referred to the Quitline?: N/A patient is not a smoker  Smart, [place] LCSWA  07/16/2015, 9:38 AM

## 2015-07-16 NOTE — Tx Team (Signed)
Interdisciplinary Treatment Plan Update (Adult)  Date:  07/16/2015  Time Reviewed:  8:26 AM   Progress in Treatment: Attending groups: Yes. Participating in groups:  Yes. Taking medication as prescribed:  Yes. Tolerating medication:  Yes. Family/Significant othe contact made:  SPE completed with pt's husband.  Patient understands diagnosis:  Yes. and As evidenced by:  seeking treatment for alcohol abuse, depression, and for medication management Discussing patient identified problems/goals with staff:  Yes. Medical problems stabilized or resolved:  Yes. Denies suicidal/homicidal ideation: Yes. Issues/concerns per patient self-inventory:  Other:  Discharge Plan or Barriers: Pt plans to continue o/p care with Dr. Toy Care and is interested in the [facility] program offered to [employer] employees for family counseling. CSW provided pt with this information.   Reason for Continuation of Hospitalization: None   Comments:  Kelsey [name] was admitted to the unit. She was calm and cooperative during her assessment. She is very tearful while answering questions and states she is under a lot of stress especially with her job. Kelsey [name] recently started abusing alcohol approximately 1 month ago. She had been sober for 3.5 years prior to her recent relapse. She is a respiratory therapist at [employer] and states she recently had to terminally wean a child off the vent who is the same age as her son. This was one of the initial triggers of her binging. She sees a psychiatrist Dr. Toy Care who is managing her anxiety with xanax TID. Over the past month Kelsey [name] has been drinking up to 4 glasses of wine a day. She denies substance abuse or cigarette smoking. Endorses marijuana use over the past weekend while she was at the beach for vacation. She started drinking in her teens. She has a 93 year old son and 75 year old daughter and states she wants to get better for her children. She endorses anxiety, depression, crying spells, hopelessness,  helplessness, insomnia, nervousness, restlessness and panic attacks. She denies SI/HI/AVH. She also denies physical, verbal or sexual abuse. She contracts for safety. She has a supportive spouse and other family members. She states she would like to have her medications adjusted to help ensure she is not going from one addiction to another i.e.. (xanax-alcohol-xanax). She is also requiring Remeron and trazodone to help her sleep.  A: Oriented Kelsey [name] to the unit. Encouraged Kelsey [name] to make sure she speaks with the psychiatrist in the morning about adjusting her medications.   Estimated length of stay:  D/c today   Additional Comments:  Patient and CSW reviewed pt's identified goals and treatment plan. Patient verbalized understanding and agreed to treatment plan. CSW reviewed [facility] "Discharge Process and Patient Involvement" Form. Pt verbalized understanding of information provided and signed form.    Review of initial/current patient goals per problem list:  1. Goal(s): Patient will participate in aftercare plan  Met: Yes   Target date: at discharge  As evidenced by: Patient will participate within aftercare plan AEB aftercare provider and housing plan at discharge being identified.    8/17: return home and followup with Dr. Toy Care and with [facility] program offered to [employer] [facility] employees for family counseling.  2. Goal (s): Patient will exhibit decreased depressive symptoms and suicidal ideations.  Met: Yes    Target date: at discharge  As evidenced by: Patient will utilize self rating of depression at 3 or below and demonstrate decreased signs of depression or be deemed stable for discharge by MD.  8/17: Pt rates depression as high this morning but denies SI/HI/AVH.   8/19: Pt  rates depression as 2/10 and presents with pleasant mood and calm affect.   3. Goal(s): Patient will demonstrate decreased signs of withdrawal due to substance abuse  Met:Yes.   Target date:at discharge    As evidenced by: Patient will produce a CIWA/COWS score of 0, have stable vitals signs, and no symptoms of withdrawal.  8/17: Pt reporting minimal withdrawal symptoms with CIWA score of 3 and high BP.   8/19: Pt reports no signs of withdrawal with CIWA score of 0/stable vitals. Goal met.    Attendees: Patient:   07/16/2015 8:26 AM   Family:   07/16/2015 8:26 AM   Physician:  Dr. Carlton Adam, MD 07/16/2015 8:26 AM   Nursing:  Gaspar Cola RN 07/16/2015 8:26 AM   Clinical Social Worker: Maxie Better, [place]  07/16/2015 8:26 AM   Clinical Social Worker: Erasmo Downer Drinkard LCSWA; Peri Maris LCSWA 07/16/2015 8:26 AM   Other:  Gerline Legacy Nurse Case Manager 07/16/2015 8:26 AM   Other:  [place]inda Dell; [facility] TCT  07/16/2015 8:26 AM   Other:   07/16/2015 8:26 AM   Other:  07/16/2015 8:26 AM   Other:  07/16/2015 8:26 AM   Other:  07/16/2015 8:26 AM    07/16/2015 8:26 AM    07/16/2015 8:26 AM    07/16/2015 8:26 AM    07/16/2015 8:26 AM    Scribe for Treatment Team:   Maxie Better, [place]  07/16/2015 8:26 AM

## 2015-07-16 NOTE — Progress Notes (Signed)
Nursing Discharge Note :Discharge instructions/medications/follow up appointments discussed with pt. Prescriptions given, samples given, and patients belongings returned to pt. Pt returning home, reports anxiety is less.Mother brought pt home. Discussed breathing exercises to use once discharged. Pt verbalizes understanding.  Pt denies SI/HI/AVH.

## 2015-07-16 NOTE — [facility] Suicide Risk Assessment (Signed)
[facility] Discharge Suicide Risk Assessment   Demographic Factors:  Caucasian  Total Time spent with patient: 30 minutes  Musculoskeletal: Strength & Muscle Tone: within normal limits Gait & Station: normal Patient leans: normal  Psychiatric Specialty Exam: Physical Exam  Review of Systems  Constitutional: Negative.   HENT: Negative.   Eyes: Negative.   Respiratory: Negative.   Cardiovascular: Negative.   Gastrointestinal: Negative.   Genitourinary: Negative.   Musculoskeletal: Negative.   Skin: Negative.   Neurological: Negative.   Endo/Heme/Allergies: Negative.   Psychiatric/Behavioral: The patient is nervous/anxious.     Blood pressure 132/89, pulse 88, temperature 97.5 F (36.4 C), temperature source Oral, resp. rate 18, height  (1.626 m), weight 78.926 kg (174 lb).Body mass index is 29.85 kg/(m^2).  General Appearance: Fairly Groomed  Patent attorney::  Fair  Speech:  Clear and Coherent409  Volume:  Normal  Mood:  Euthymic  Affect:  Appropriate  Thought Process:  Coherent and Goal Directed  Orientation:  Full (Time, Place, and Person)  Thought Content:  plans as she moves on, relapse prevention plan  Suicidal Thoughts:  No  Homicidal Thoughts:  No  Memory:  Immediate;   Fair Recent;   Fair Remote;   Fair  Judgement:  Fair  Insight:  Present  Psychomotor Activity:  Normal  Concentration:  Fair  Recall:  [employer] of Knowledge:Fair  Language: Fair  Akathisia:  No  Handed:  Right  AIMS (if indicated):     Assets:  Desire for Improvement Housing Social Support Talents/Skills Vocational/Educational  Sleep:  Number of Hours: 3.75  Cognition: WNL  ADL's:  Intact   Have you used any form of tobacco in the last 30 days? (Cigarettes, Smokeless Tobacco, Cigars, and/or Pipes): No  Has this patient used any form of tobacco in the last 30 days? (Cigarettes, Smokeless Tobacco, Cigars, and/or Pipes) No  Mental Status Per Nursing Assessment::   On Admission:   NA  Current Mental Status by Physician: In full contact with reality. There are no active S/S of withdrawal. She is willing and motivated to pursue further work on stress management. She plans to work with her MD to come off the Xanax slowly   Loss Factors: Decline in physical health  Historical Factors: NA  Risk Reduction Factors:   Sense of responsibility to family, Employed, Living with another person, especially a relative, Positive social support and Positive therapeutic relationship  Continued Clinical Symptoms:  Severe Anxiety and/or Agitation Depression:   Comorbid alcohol abuse/dependence  Cognitive Features That Contribute To Risk:  None    Suicide Risk:  Minimal: No identifiable suicidal ideation.  Patients presenting with no risk factors but with morbid ruminations; may be classified as minimal risk based on the severity of the depressive symptoms  Principal Problem: Depression, major, recurrent, moderate Discharge Diagnoses:  Patient Active Problem List   Diagnosis Date Noted  . Alcohol abuse [F10.10] 07/14/2015  . GAD (generalized anxiety disorder) [F41.1] 07/14/2015  . Depression, major, recurrent, moderate [F33.1] 07/14/2015  . Periscapular pain [M89.8X1] 01/19/2015  . Left-sided thoracic back pain-greater than 20 years duration [M54.6] 01/12/2015  . GERD (gastroesophageal reflux disease) [K21.9] 08/22/2013  . Asthma exacerbation [J45.901] 06/13/2012  . 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 Abnormal ECG- T  ST changes in V3 through V5 [R94.31] 05/02/2011    Follow-up Information    Follow up with [facility] On 07/20/2015.   Why:  Appt with Dr. Evelene Croon on this date  at 6:00PM for medication management/hospital follow-up.    Contact information:   ATTN: Dr. Evelene Croon  [address] [place], [place] [address] Phone: [phone] Fax: [phone]      Follow up with [facility] Employee Assistance [facility]ing Program  (EACP)-Counseling.   Why:  Please contact [facility] at discharge to schedule family session. Thank you.    Contact information:   Glendell Docker [address]  [place], [place] [address] Phone: [phone]      Plan Of Care/Follow-up recommendations:  Activity:  as tolerated Diet:  regular Follow up Junious Silk and EAP as above Is patient on multiple antipsychotic therapies at discharge:  No   Has Patient had three or more failed trials of antipsychotic monotherapy by history:  No  Recommended Plan for Multiple Antipsychotic Therapies: NA    Latosha Gaylord A 07/16/2015, 11:54 AM

## 2015-07-16 NOTE — Discharge Summary (Signed)
Physician Discharge Summary Note  Patient:  Kelsey [name] is an 34 y.o., female MRN:  [id number] DOB:  Apr 15, 1981 Patient phone:  [phone] (home)  Patient address:   [place] [address],  Total Time spent with patient: 30 minutes  Date of Admission:  07/14/2015 Date of Discharge: 07/16/2015  Reason for Admission:  depression  Principal Problem: Depression, major, recurrent, moderate Discharge Diagnoses: Patient Active Problem List   Diagnosis Date Noted  . Alcohol abuse [F10.10] 07/14/2015  . GAD (generalized anxiety disorder) [F41.1] 07/14/2015  . Depression, major, recurrent, moderate [F33.1] 07/14/2015  . Periscapular pain [M89.8X1] 01/19/2015  . Left-sided thoracic back pain-greater than 20 years duration [M54.6] 01/12/2015  . GERD (gastroesophageal reflux disease) [K21.9] 08/22/2013  . Asthma exacerbation [J45.901] 06/13/2012  . 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 Abnormal ECG- T  ST changes in V3 through V5 [R94.31] 05/02/2011    Musculoskeletal: Strength & Muscle Tone: within normal limits Gait & Station: normal Patient leans: N/A  Psychiatric Specialty Exam: Physical Exam  Vitals reviewed. Psychiatric: Her mood appears not anxious. Thought content is not paranoid. Cognition and memory are normal. She does not exhibit a depressed mood.    Review of Systems  Constitutional: Negative for fever.  Respiratory: Negative for cough.   Cardiovascular: Negative for chest pain.  Genitourinary: Negative for dysuria.  Skin: Negative for rash.  Neurological: Negative for headaches.  Psychiatric/Behavioral: The patient is nervous/anxious.     Blood pressure 132/89, pulse 88, temperature 97.5 F (36.4 C), temperature source Oral, resp. rate 18, height 5' 4"  (1.626 m), weight 78.926 kg (174 lb).Body mass index is 29.85 kg/(m^2).   General Appearance: Fairly Groomed  Engineer, water:: Fair  Speech: Clear and Coherent   Volume: Normal  Mood: Euthymic  Affect: Appropriate  Thought Process: Coherent and Goal Directed  Orientation: Full (Time, Place, and Person)  Thought Content: plans as she moves on, relapse prevention plan  Suicidal Thoughts: No  Homicidal Thoughts: No  Memory: Immediate; Fair Recent; Fair Remote; Fair  Judgement: Fair  Insight: Present  Psychomotor Activity: Normal  Concentration: Fair  Recall: [employer] [place]  Language: Fair  Akathisia: No  Handed: Right  AIMS (if indicated):    Assets: Desire for Improvement Housing Social Support Talents/Skills Vocational/Educational  Sleep: Number of Hours: 3.75  Cognition: WNL  ADL's: Intact       Have you used any form of tobacco in the last 30 days? (Cigarettes, Smokeless Tobacco, Cigars, and/or Pipes): No  Has this patient used any form of tobacco in the last 30 days? (Cigarettes, Smokeless Tobacco, Cigars, and/or Pipes) N/A  Past Medical History:  Past Medical History  Diagnosis Date  . PVC (premature ventricular contraction)   . Palpitations   . Pre-syncope   . Tachycardia   . Depression   . Insomnia   . Seasonal allergies   . Asthma   . Anxiety   . Depression   . Insomnia   . GERD (gastroesophageal reflux disease)     Past Surgical History  Procedure Laterality Date  . Cesarean section  10/15/2009, 02/08/2006    times 2   Family History:  Family History  Problem Relation Age of Onset  . Heart disease Father   . Rheumatologic disease Mother   . Lung cancer Paternal Grandmother    Social History:  History  Alcohol Use  . 12.6 oz/week  . 21 Glasses of wine, 0 Standard drinks or equivalent  per week    Comment: pt states 2-3 glasses of wine per day     History  Drug Use  . Yes  . Special: Marijuana    Social History   Social History  . Marital Status: Married    Spouse Name: N/A  . Number of Children: 2  . Years of Education: N/A    Occupational History  . 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 Smoking status: Never Smoker   . Smokeless tobacco: Never Used     Comment: reports "tried it as a teenager" but has not smoked since  . Alcohol Use: 12.6 oz/week    21 Glasses of wine, 0 Standard drinks or equivalent per week     Comment: pt states 2-3 glasses of wine per day  . Drug Use: Yes    Special: Marijuana  . Sexual Activity: Yes    Birth Control/ Protection: IUD   Other Topics Concern  . None   Social History Narrative   Risk to Self: Is patient at risk for suicide?: No What has been your use of drugs/alcohol within the last 12 months?: daily alcohol (wine) abuse for the past month after 3 1/2 years of sobriety. smoked marijuana last week when she was at the beach but reports no other drug abuse.  Risk to Others:   Prior Inpatient Therapy:   Prior Outpatient Therapy:    Level of Care:  OP  Hospital Course:  CARALYN TWINING was admitted for Depression, major, recurrent, moderate and crisis management.  She was treated discharged with the medications listed below under Medication List.  Medical problems were identified and treated as needed.  Home medications were restarted as appropriate.  Improvement was monitored by observation and Renita Papa daily report of symptom reduction.  Emotional and mental status was monitored by daily self-inventory reports completed by Renita Papa and clinical staff.         Renita Papa was evaluated by the treatment team for stability and plans for continued recovery upon discharge.  Renita Papa motivation was an integral factor for scheduling further treatment.  Employment, transportation, bed availability, health status, family support, and any pending legal issues were also considered during her hospital stay.  She was offered further treatment options upon discharge including but not limited to Residential, Intensive  Outpatient, and Outpatient treatment.  Renita Papa will follow up with the services as listed below under Follow Up Information.     Upon completion of this admission the patient was both mentally and medically stable for discharge denying suicidal/homicidal ideation, auditory/visual/tactile hallucinations, delusional thoughts and paranoia.      Consults:  psychiatry  Significant Diagnostic Studies:  labs: per ED  Discharge Vitals:   Blood pressure 132/89, pulse 88, temperature 97.5 F (36.4 C), temperature source Oral, resp. rate 18, height 5' 4"  (1.626 m), weight 78.926 kg (174 lb). Body mass index is 29.85 kg/(m^2). Lab Results:   Results for orders placed or performed during the hospital encounter of 07/13/15 (from the past 72 hour(s))  Comprehensive metabolic panel     Status: None   Collection Time: 07/13/15 10:18 PM  Result Value Ref Range   Sodium 139 135 - 145 mmol/L   Potassium 4.1 3.5 - 5.1 mmol/L   Chloride 101 101 - 111 mmol/L   CO2 27 22 - 32 mmol/L   Glucose, Bld 95 65 - 99 mg/dL   BUN 15 6 -  20 mg/dL   Creatinine, Ser 0.81 0.44 - 1.00 mg/dL   Calcium 9.6 8.9 - 10.3 mg/dL   Total Protein 8.0 6.5 - 8.1 g/dL   Albumin 4.9 3.5 - 5.0 g/dL   AST 28 15 - 41 U/L   ALT 23 14 - 54 U/L   Alkaline Phosphatase 72 38 - 126 U/L   Total Bilirubin 0.9 0.3 - 1.2 mg/dL   GFR calc non Af Amer >60 >60 mL/min   GFR calc Af Amer >60 >60 mL/min    Comment: (NOTE) The eGFR has been calculated using the CKD EPI equation. This calculation has not been validated in all clinical situations. eGFR's persistently <60 mL/min signify possible Chronic Kidney Disease.    Anion gap 11 5 - 15  Ethanol (ETOH)     Status: None   Collection Time: 07/13/15 10:18 PM  Result Value Ref Range   Alcohol, Ethyl (B) <5 <5 mg/dL    Comment:        LOWEST DETECTABLE LIMIT FOR SERUM ALCOHOL IS 5 mg/dL FOR MEDICAL PURPOSES ONLY   Salicylate level     Status: None   Collection Time: 07/13/15 10:18  PM  Result Value Ref Range   Salicylate Lvl <7.6 2.8 - 30.0 mg/dL  Acetaminophen level     Status: Abnormal   Collection Time: 07/13/15 10:18 PM  Result Value Ref Range   Acetaminophen (Tylenol), Serum <10 (L) 10 - 30 ug/mL    Comment:        THERAPEUTIC CONCENTRATIONS VARY SIGNIFICANTLY. A RANGE OF 10-30 ug/mL MAY BE AN EFFECTIVE CONCENTRATION FOR MANY PATIENTS. HOWEVER, SOME ARE BEST TREATED AT CONCENTRATIONS OUTSIDE THIS RANGE. ACETAMINOPHEN CONCENTRATIONS >150 ug/mL AT 4 HOURS AFTER INGESTION AND >50 ug/mL AT 12 HOURS AFTER INGESTION ARE OFTEN ASSOCIATED WITH TOXIC REACTIONS.   CBC     Status: Abnormal   Collection Time: 07/13/15 10:18 PM  Result Value Ref Range   WBC 9.8 4.0 - 10.5 K/uL   RBC 4.85 3.87 - 5.11 MIL/uL   Hemoglobin 15.1 (H) 12.0 - 15.0 g/dL   HCT 46.4 (H) 36.0 - 46.0 %   MCV 95.7 78.0 - 100.0 fL   MCH 31.1 26.0 - 34.0 pg   MCHC 32.5 30.0 - 36.0 g/dL   RDW 13.6 11.5 - 15.5 %   Platelets 354 150 - 400 K/uL  I-Stat beta hCG blood, ED ([facility], [facility], [facility] only)     Status: None   Collection Time: 07/13/15 10:29 PM  Result Value Ref Range   I-stat hCG, quantitative <5.0 <5 mIU/mL   Comment 3            Comment:   GEST. AGE      CONC.  (mIU/mL)   <=1 WEEK        5 - 50     2 WEEKS       50 - 500     3 WEEKS       100 - 10,000     4 WEEKS     1,000 - 30,000        FEMALE AND NON-PREGNANT FEMALE:     LESS THAN 5 mIU/mL   Urine rapid drug screen (hosp performed) (Not at [facility])     Status: Abnormal   Collection Time: 07/14/15 12:12 AM  Result Value Ref Range   Opiates POSITIVE (A) NONE DETECTED   Cocaine NONE DETECTED NONE DETECTED   Benzodiazepines POSITIVE (A) NONE DETECTED   Amphetamines NONE DETECTED  NONE DETECTED   Tetrahydrocannabinol NONE DETECTED NONE DETECTED   Barbiturates NONE DETECTED NONE DETECTED    Comment:        DRUG SCREEN FOR MEDICAL PURPOSES ONLY.  IF CONFIRMATION IS NEEDED FOR ANY PURPOSE, NOTIFY LAB WITHIN 5 DAYS.        LOWEST  DETECTABLE LIMITS FOR URINE DRUG SCREEN Drug Class       Cutoff (ng/mL) Amphetamine      1000 Barbiturate      200 Benzodiazepine   374 Tricyclics       827 Opiates          300 Cocaine          300 THC              50     Physical Findings: AIMS: Facial and Oral Movements Muscles of Facial Expression: None, normal Lips and Perioral Area: None, normal Jaw: None, normal Tongue: None, normal,Extremity Movements Upper (arms, wrists, hands, fingers): None, normal Lower (legs, knees, ankles, toes): None, normal, Trunk Movements Neck, shoulders, hips: None, normal, Overall Severity Severity of abnormal movements (highest score from questions above): None, normal Incapacitation due to abnormal movements: None, normal Patient's awareness of abnormal movements (rate only patient's report): No Awareness, Dental Status Current problems with teeth and/or dentures?: No Does patient usually wear dentures?: Yes (upper bridge)  CIWA:  CIWA-Ar Total: 3 COWS:  COWS Total Score: 3   See Psychiatric Specialty Exam and Suicide Risk Assessment completed by Attending Physician prior to discharge.  Discharge destination:  Home  Is patient on multiple antipsychotic therapies at discharge:  No   Has Patient had three or more failed trials of antipsychotic monotherapy by history:  No  Recommended Plan for Multiple Antipsychotic Therapies: NA    Medication List    STOP taking these medications        ALPRAZolam 0.5 MG tablet  Commonly known as:  XANAX     azithromycin 250 MG tablet  Commonly known as:  ZITHROMAX Z-PAK     cetirizine 10 MG tablet  Commonly known as:  ZYRTEC     cyclobenzaprine 10 MG tablet  Commonly known as:  FLEXERIL     desvenlafaxine 50 MG 24 hr tablet  Commonly known as:  PRISTIQ     EPINEPHrine 0.3 mg/0.3 mL Soaj injection  Commonly known as:  EPI-PEN     esomeprazole 40 MG capsule  Commonly known as:  NEXIUM  Replaced by:  pantoprazole 40 MG tablet      HYDROcodone-homatropine 5-1.5 MG/5ML syrup  Commonly known as:  HYCODAN     predniSONE 10 MG tablet  Commonly known as:  DELTASONE     predniSONE 20 MG tablet  Commonly known as:  DELTASONE      TAKE these medications      Indication   albuterol 108 (90 BASE) MCG/ACT inhaler  Commonly known as:  PROAIR HFA  INHALE 2 PUFFS INTO THE LUNGS EVERY 4 HOURS AS NEEDED (FOR SHORTNESS OF BREATH).   Indication:  Asthma, Chronic Obstructive Lung Disease     beclomethasone 80 MCG/ACT inhaler  Commonly known as:  QVAR  Inhale 1 puff into the lungs 2 (two) times daily.   Indication:  Asthma     budesonide-formoterol 160-4.5 MCG/ACT inhaler  Commonly known as:  SYMBICORT  Inhale 2 puffs into the lungs 2 (two) times daily.   Indication:  Asthma, Chronic Obstructive Lung Disease     levonorgestrel 20 MCG/24HR IUD  Commonly  known as:  MIRENA  1 Intra Uterine Device (1 each total) by Intrauterine route once.   Indication:  Pregnancy     mirtazapine 45 MG tablet  Commonly known as:  REMERON  Take 1 tablet (45 mg total) by mouth at bedtime.   Indication:  Trouble Sleeping     montelukast 10 MG tablet  Commonly known as:  SINGULAIR  Take 1 tablet (10 mg total) by mouth at bedtime.   Indication:  Hayfever     pantoprazole 40 MG tablet  Commonly known as:  PROTONIX  Take 1 tablet (40 mg total) by mouth daily.      traZODone 100 MG tablet  Commonly known as:  DESYREL  Take 2 tablets (200 mg total) by mouth at bedtime as needed for sleep.   Indication:  Trouble Sleeping       Follow-up Information    Follow up with [facility] On 07/20/2015.   Why:  Appt with Dr. Toy Care on this date at 6:00PM for medication management/hospital follow-up.    Contact information:   ATTN: Dr. Toy Care  [address] [place], [place] [address] Phone: [phone] Fax: [phone]      Follow up with [facility] ([facility].   Why:  Please contact  [facility] at discharge to schedule family session. Thank you.    Contact information:   Annie Main [address]  [place], [place] [address] Phone: [phone]      Follow-up recommendations:  Activity:  as tol, diet as tol  Comments:  1.  Take all your medications as prescribed.              2.  Report any adverse side effects to outpatient provider.                       3.  Patient instructed to not use alcohol or illegal drugs while on prescription medicines.            4.  In the event of worsening symptoms, instructed patient to call 911, the crisis hotline or go to nearest emergency room for evaluation of symptoms.  Total Discharge Time:  40 min  Signed: Freda Munro May Agustin AGNP-BC 07/16/2015, 4:17 PM  I personally assessed the patient and formulated the plan Geralyn Flash A. Sabra Heck, M.D.

## 2015-07-16 NOTE — Progress Notes (Signed)
Adult Psychoeducational Group Note  Date:  07/16/2015 Time:  1:34 PM  Group Topic/Focus:   Relapse Prevention Planning:   The focus of this group is to define relapse and discuss the need for planning to combat relapse.  Participation Level:  Active  Participation Quality:  Appropriate, Attentive, Sharing and Supportive  Affect:  Appropriate and Excited  Cognitive:  Alert and Appropriate  Insight: Appropriate and Good  Engagement in Group:  Engaged and Supportive  Modes of Intervention:  Discussion  Additional Comments: Patient attended group this morning and was very interactive.  Patient states that she is looking forward to repairing her relationship with her son. Relapse was caused by tragic news of a students suicide. Patient will continue to attend AA and has no urges to take a drink at this time.  Hershy Flenner A 07/16/2015, 1:34 PM

## 2015-07-19 NOTE — Clinical Social Work Note (Signed)
Per pt request, FMLA faxed to [phone] today.  Trula Slade, LCSWA Clinical Social Worker 07/19/2015 3:59 PM

## 2015-07-26 NOTE — Clinical Social Work Note (Signed)
Per dr. Dub Mikes, Short term disability paperwork faxed to [employer]. CSW faxed paperwork to: [phone]. CSW called Edda Orea at [phone] and left message letting her know that paperwork has been faxed.  Trula Slade, LCSWA Clinical Social Worker 07/26/2015 3:48 PM

## 2015-08-09 ENCOUNTER — Other Ambulatory Visit: Payer: Self-pay | Admitting: Pulmonary Disease

## 2015-08-13 ENCOUNTER — Other Ambulatory Visit: Payer: Self-pay | Admitting: Pulmonary Disease

## 2015-08-20 ENCOUNTER — Telehealth: Payer: Self-pay | Admitting: Pulmonary Disease

## 2015-08-20 MED ORDER — ALBUTEROL SULFATE HFA 108 (90 BASE) MCG/ACT IN AERS: INHALATION_SPRAY | RESPIRATORY_TRACT | Status: DC

## 2015-08-20 NOTE — Telephone Encounter (Signed)
Spoke with pt and is aware RX sent in. Nothing further needed 

## 2015-09-03 ENCOUNTER — Telehealth: Payer: Self-pay | Admitting: Pulmonary Disease

## 2015-09-03 MED ORDER — PREDNISONE 10 MG PO TABS: ORAL_TABLET | ORAL | Status: DC

## 2015-09-03 NOTE — Telephone Encounter (Signed)
Spoke with pt, states she wants to make RA aware that she had an asthma flare up starting a few days ago, filled pred taper on Wednesday.  States this is helping.  Pt wants to just make RA aware, and see if another one needs to be sent to her pharmacy to keep on hand if this flares up again.    Pt uses [facility].    RA please advise.  Thanks!

## 2015-09-03 NOTE — Telephone Encounter (Signed)
Spoke with pt. Advised her that we would be sending in a prednisone taper for her. Nothing further was needed.

## 2015-09-03 NOTE — Telephone Encounter (Signed)
Pl send another Rx for 10 mg tabs prednisone Take 4 tabs  daily with food x 4 days, then 3 tabs daily x 4 days, then 2 tabs daily x 4 days, then 1 tab daily x4 days then stop. #40

## 2015-09-13 ENCOUNTER — Telehealth: Payer: Self-pay | Admitting: Pulmonary Disease

## 2015-09-13 MED ORDER — PROMETHAZINE-CODEINE 6.25-10 MG/5ML PO SYRP: 5.0000 mL | ORAL_SOLUTION | Freq: Every evening | ORAL | Status: DC | PRN

## 2015-09-13 MED ORDER — ALBUTEROL SULFATE (2.5 MG/3ML) 0.083% IN NEBU: 2.5000 mg | INHALATION_SOLUTION | Freq: Two times a day (BID) | RESPIRATORY_TRACT | Status: DC | PRN

## 2015-09-13 NOTE — Telephone Encounter (Signed)
Called spoke with pt. She is wanting a refill on albuterol nebulizer solution. This is not on current med list and does not look like this has been refilled by us before.  Also pt c/o prod cough (clear phlem), worse at night, PND, nasal cong. She has been taking delsym. Wants something called in for cough as well. Please advise Dr. Vassie LollAlva thanks

## 2015-09-13 NOTE — Telephone Encounter (Signed)
Called pt and left message notifying of rx being called into pt's pharmacy and RA rec  Medication sent eletronically to pt's pharmacy  Nothing further is needed

## 2015-09-13 NOTE — Telephone Encounter (Signed)
Okay to provide prescription for albuterol nebs thrice daily as needed #45 X 1 month x 3 refills Promethazine-codeine syrup 6.25 at bedtime for cough # 100 ml Used Delsym daytime

## 2015-10-15 ENCOUNTER — Ambulatory Visit (INDEPENDENT_AMBULATORY_CARE_PROVIDER_SITE_OTHER): Payer: 59 | Admitting: Adult Health

## 2015-10-15 ENCOUNTER — Encounter: Payer: Self-pay | Admitting: Adult Health

## 2015-10-15 VITALS — BP 112/84 | HR 88 | Temp 98.6°F | Ht 64.0 in | Wt 180.0 lb

## 2015-10-15 DIAGNOSIS — J45909 Unspecified asthma, uncomplicated: Secondary | ICD-10-CM | POA: Insufficient documentation

## 2015-10-15 DIAGNOSIS — J309 Allergic rhinitis, unspecified: Secondary | ICD-10-CM | POA: Diagnosis not present

## 2015-10-15 DIAGNOSIS — J4550 Severe persistent asthma, uncomplicated: Secondary | ICD-10-CM

## 2015-10-15 NOTE — Patient Instructions (Signed)
Continue on current regimen  Follow up Dr. Alva  In 3 months and As needed   

## 2015-10-15 NOTE — Assessment & Plan Note (Signed)
Asthma with frequent exacerbations She is to continue on her current regimen. Steroid discussion with patient and she is on 2 inhaled corticosteroids and has frequent oral steroids. We'll hold off on prednisone taper today as symptoms seem to be improving after recent flare. Also, patient has had difficulty with anxiety and depression according to chart review, which steroids can exacerbate.

## 2015-10-15 NOTE — Assessment & Plan Note (Signed)
Cont on current regimen  

## 2015-10-15 NOTE — Progress Notes (Signed)
   Subjective:    Patient ID: Kelsey [name], female    DOB: [birth year], 34 y.o.   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   She had frequent flares in 2013, stable x 1 yr, & again flares since 06/2013.  Asthma triggers include heat humidity allergies and cats  Environment - she has lived in the same house since 2009. She has a dog since 2004. She has perennial allergies and took allergy shots until age 34. Her symptoms are not worse at work and she does not describe any triggers at work.   Significant tests/ events Of note she had a rheumatological evaluation by Dr. Nickola MajorHawkes, ANA, RA factor was negative sedimentation rate was 3, CPK was 44, thyroid function was normal  RAST 2013 - High titers to cat & dog dander, grass & house dust  PFTs - November 2013 showed FEV1 of 1.87-59% improving to 2.24-70% with bronchodilator.   Sep 2014 severe asthma exacerbation requiring hospitalization. Due to her frequent symptoms -extended pred taper until jan 2015   Of note,rast in 2013 showed high IgE. >start Xolair , unable to afford   10/15/2015  Follow up : Asthma  Patient returns for a four-month follow-up. She says overall she has been doing okay. Over the last couple weeks. She's had one flare about a month ago that improved with prednisone. He does get occasional shortness of breath, tightness and wheezing. Does have a dry cough that comes and goes, especially at bedtime.  No longer taking  Xolair -unable to afford.  On Symbicort and QVAR  and Singulair. She denies any fever, hemoptysis, orthopnea, PND, or increased leg swelling.     Past Medical History  Diagnosis Date  . PVC (premature ventricular contraction)   . Palpitations   . Pre-syncope   . Tachycardia   . Depression   . Insomnia   . Seasonal allergies   . Asthma   . Anxiety   . Depression   . Insomnia   . GERD (gastroesophageal reflux  disease)      Review of Systems neg for any significant sore throat, dysphagia, itching, sneezing, nasal congestion or excess/ purulent secretions, fever, chills, sweats, unintended wt loss, pleuritic or exertional cp, hempoptysis, orthopnea pnd or change in chronic leg swelling. Also denies presyncope, palpitations, heartburn, abdominal pain, nausea, vomiting, diarrhea or change in bowel or urinary habits, dysuria,hematuria, rash, arthralgias, visual complaints, headache, numbness weakness or ataxia.     Objective:   Physical Exam  Gen. Pleasant, well-nourished, in no distress ENT - no lesions, no post nasal drip Neck: No JVD, no thyromegaly, no carotid bruits Lungs: no use of accessory muscles, no dullness to percussion, clear without rales or rhonchi , no wheezing , good aeration, no stridor.  Cardiovascular: Rhythm regular, heart sounds  normal, no murmurs or gallops, no peripheral edema, neg calf pain, neg homans sign  Musculoskeletal: No deformities, no cyanosis or clubbing      Assessment & Plan:

## 2015-10-17 NOTE — Progress Notes (Signed)
Reviewed & agree with plan  

## 2015-10-25 ENCOUNTER — Telehealth: Payer: Self-pay | Admitting: Pulmonary Disease

## 2015-10-25 MED ORDER — HYDROCOD POLST-CPM POLST ER 10-8 MG/5ML PO SUER: ORAL | Status: DC

## 2015-10-25 MED ORDER — PREDNISONE 10 MG PO TABS: ORAL_TABLET | ORAL | Status: DC

## 2015-10-25 NOTE — Telephone Encounter (Signed)
Called, spoke with pt -  Believes she is having asthma flare x 4 days.  Has a continuous cough with yellow mucus, feels bad, increased DOE and orthopnea, and wheezing.  No chest tightness or f/c/s.  Feels symptoms are "way worse" since last OV with TP on 10/15/15.  Is using Albuterol HFA q2h x last few days with only short term relief and albuterol neb qhs.  For cough, is using nyquil and delsym with not much relief.  Has promethazine codiene cough syrup on med list but is out.  Pt is requesting pred taper and tussionex as this works better than promethazine codeine. Tammy, RA is working nights.  Will you please advise?  Thank you.  [facility]  Allergies  Allergen Reactions  . Penicillins Rash

## 2015-10-25 NOTE — Telephone Encounter (Signed)
Prednisone 10mg  #20 , 4 tabs for 2 days, then 3 tabs for 2 days, 2 tabs for 2 days, then 1 tab for 2 days, then stop  Tussionex #4 oz 1 tsp Twice daily  As needed  Cough, may cause sleepiness. No refills  Please contact office for sooner follow up if symptoms do not improve or worsen or seek emergency care  Ov if not improving

## 2015-10-25 NOTE — Telephone Encounter (Signed)
Pred rx printed.  It was signed by TP and faxed to [facility].

## 2015-10-25 NOTE — Telephone Encounter (Signed)
Spoke with pt.  Discussed below recs per TP.  Pt verbalized understanding and is in agreement with plan.  Pt is aware pred taper sent to pharm and Tussionex rx [facility].  She verbalized understanding, is in agreement with plan, and voiced no further questions or concerns at time.  Pt aware to seek emergency care or call back for OV if symptoms worsen or do not improve.

## 2015-10-28 ENCOUNTER — Encounter: Payer: Self-pay | Admitting: Adult Health

## 2015-10-28 ENCOUNTER — Ambulatory Visit (INDEPENDENT_AMBULATORY_CARE_PROVIDER_SITE_OTHER)
Admission: RE | Admit: 2015-10-28 | Discharge: 2015-10-28 | Disposition: A | Discharge status: Home / Self Care | Payer: 59 | Source: Ambulatory Visit | Attending: Adult Health | Admitting: Adult Health

## 2015-10-28 ENCOUNTER — Ambulatory Visit (INDEPENDENT_AMBULATORY_CARE_PROVIDER_SITE_OTHER): Payer: 59 | Admitting: Adult Health

## 2015-10-28 ENCOUNTER — Telehealth: Payer: Self-pay | Admitting: Pulmonary Disease

## 2015-10-28 VITALS — BP 110/84 | HR 79 | Temp 97.3°F | Ht 64.0 in | Wt 181.0 lb

## 2015-10-28 DIAGNOSIS — J45901 Unspecified asthma with (acute) exacerbation: Secondary | ICD-10-CM

## 2015-10-28 DIAGNOSIS — R0602 Shortness of breath: Secondary | ICD-10-CM

## 2015-10-28 MED ORDER — CEFDINIR 300 MG PO CAPS: 300.0000 mg | ORAL_CAPSULE | Freq: Two times a day (BID) | ORAL | Status: DC

## 2015-10-28 NOTE — Telephone Encounter (Signed)
Called pt and appt scheduled to see TP this afternoon at 4pm. Nothing further needed

## 2015-10-28 NOTE — Patient Instructions (Signed)
Omnicef 300mg  Twice daily  For 7 days  Eat yogurt daily .  Mucinex DM twice daily as needed. For cough, congestion. Finish Prednisone taper as directed Chest xray today  Followup Dr. Vassie LollAlva in 2 months and as needed. Please contact office for sooner follow up if symptoms do not improve or worsen or seek emergency care

## 2015-10-28 NOTE — Progress Notes (Signed)
   Subjective:    Patient ID: Kelsey NoseJennifer L Keen, female    DOB: [birth year], 34 y.o.   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   She had frequent flares in 2013, stable x 1 yr, & again flares since 06/2013.  Asthma triggers include heat humidity allergies and cats  Environment - she has lived in the same house since 2009. She has a dog since 2004. She has perennial allergies and took allergy shots until age 34. Her symptoms are not worse at work and she does not describe any triggers at work.   Significant tests/ events Of note she had a rheumatological evaluation by Dr. Nickola MajorHawkes, ANA, RA factor was negative sedimentation rate was 3, CPK was 44, thyroid function was normal  RAST 2013 - High titers to cat & dog dander, grass & house dust  PFTs - November 2013 showed FEV1 of 1.87-59% improving to 2.24-70% with bronchodilator.   Sep 2014 severe asthma exacerbation requiring hospitalization. Due to her frequent symptoms -extended pred taper until jan 2015   Of note,rast in 2013 showed high IgE. >start Xolair , unable to afford   10/28/2015  Acute OV  : Asthma  Patient presents for an acute office visit.  Complains of increased SOB and prod cough with yellow mucus x 5 days. Chest tightness, sinus pressure, and wheezing. Denies fever, nausea or vomiting. Was called in prednisone taper few days ago, but still has congestion. Getting up thick yellow mucus.  On Symbicort and QVAR  and Singulair. She denies any fever, hemoptysis, orthopnea, PND, or increased leg swelling.     Past Medical History  Diagnosis Date  . PVC (premature ventricular contraction)   . Palpitations   . Pre-syncope   . Tachycardia   . Depression   . Insomnia   . Seasonal allergies   . Asthma   . Anxiety   . Depression   . Insomnia   . GERD (gastroesophageal reflux disease)      Review of Systems  neg for any significant sore  throat, dysphagia, itching, sneezing, fever, chills, sweats, unintended wt loss, pleuritic or exertional cp, hempoptysis, orthopnea pnd or change in chronic leg swelling. Also denies presyncope, palpitations, heartburn, abdominal pain, nausea, vomiting, diarrhea or change in bowel or urinary habits, dysuria,hematuria, rash, arthralgias, visual complaints, headache, numbness weakness or ataxia.     Objective:   Physical Exam   Gen. Pleasant, well-nourished, in no distress ENT - no lesions, no post nasal drip Neck: No JVD, no thyromegaly, no carotid bruits Lungs:  Decreased in bases , no wheezing , no accessory muscle use , no stridor.  Cardiovascular: Rhythm regular, heart sounds  normal, no murmurs or gallops, no peripheral edema, neg calf pain, neg homans sign  Musculoskeletal: No deformities, no cyanosis or clubbing      Assessment & Plan:

## 2015-10-31 NOTE — Assessment & Plan Note (Signed)
Recurrent asthma exacerbation despite maximum treatment options.  We are looking into Xolair again to see if more affordable option is available for her.  Check cxr   Plan  Omnicef 300mg  Twice daily  For 7 days  Eat yogurt daily .  Mucinex DM twice daily as needed. For cough, congestion. Finish Prednisone taper as directed Chest xray today  Followup Dr. Vassie LollAlva in 2 months and as needed. Please contact office for sooner follow up if symptoms do not improve or worsen or seek emergency care

## 2015-11-01 ENCOUNTER — Telehealth: Payer: Self-pay | Admitting: Pulmonary Disease

## 2015-11-01 NOTE — Telephone Encounter (Signed)
Result Notes     Notes Recorded by Lorel MonacoLindsay C Lemons, CMA on 11/01/2015 at 1:12 PM lmtcb x1 for pt. ------  Notes Recorded by Julio Sicksammy S Parrett, NP on 10/31/2015 at 7:44 PM CXR is clear , no sign of pna  Cont w/ ov recs  Please contact office for sooner follow up if symptoms do not improve or worsen or seek emergency care      Called pt and informed of cxr results per TP and rec Pt voiced understanding  Nothing further is needed at this time.

## 2015-11-18 ENCOUNTER — Telehealth: Payer: Self-pay | Admitting: Pulmonary Disease

## 2015-11-18 NOTE — Telephone Encounter (Signed)
lmtcb x1 for pt. We do not have samples at this time. 

## 2015-11-18 NOTE — Telephone Encounter (Signed)
Pt returning call and I let her know that we didn't have samples that she was asking for and she was okay with that.Kelsey GriffinsStanley A Dalton

## 2015-12-02 ENCOUNTER — Telehealth: Payer: Self-pay | Admitting: Pulmonary Disease

## 2015-12-02 NOTE — Telephone Encounter (Signed)
Dr. Vassie LollAlva brought the FMLA papers to me at [facility]. [facility] to sent to [employer].

## 2015-12-02 NOTE — Telephone Encounter (Signed)
I have checked in RA's look at, there aren't any FMLA papers. Marcelino DusterMichelle do you have these?

## 2015-12-03 NOTE — Telephone Encounter (Signed)
Called work # and was advised pt NA. Called cell # and LMTCB x1 Per Marcelino DusterMichelle this was sent to [employer] this AM

## 2015-12-03 NOTE — Telephone Encounter (Signed)
Patient advised that paperwork has been submitted to [employer]. Nothing further needed.

## 2015-12-13 ENCOUNTER — Encounter: Payer: Self-pay | Admitting: Pulmonary Disease

## 2015-12-13 ENCOUNTER — Inpatient Hospital Stay (HOSPITAL_COMMUNITY): Payer: 59

## 2015-12-13 ENCOUNTER — Ambulatory Visit (INDEPENDENT_AMBULATORY_CARE_PROVIDER_SITE_OTHER): Payer: 59 | Admitting: Pulmonary Disease

## 2015-12-13 ENCOUNTER — Encounter (HOSPITAL_COMMUNITY): Payer: Self-pay | Admitting: General Practice

## 2015-12-13 ENCOUNTER — Inpatient Hospital Stay (HOSPITAL_COMMUNITY)
Admission: AD | Admit: 2015-12-13 | Discharge: 2015-12-16 | DRG: 202 | Disposition: A | Discharge status: Home / Self Care | Payer: 59 | Source: Ambulatory Visit | Attending: Pulmonary Disease | Admitting: Pulmonary Disease

## 2015-12-13 VITALS — BP 118/82 | HR 97 | Temp 97.4°F | Ht 64.0 in | Wt 174.6 lb

## 2015-12-13 DIAGNOSIS — F1290 Cannabis use, unspecified, uncomplicated: Secondary | ICD-10-CM | POA: Diagnosis present

## 2015-12-13 DIAGNOSIS — E872 Acidosis: Secondary | ICD-10-CM | POA: Diagnosis present

## 2015-12-13 DIAGNOSIS — J4552 Severe persistent asthma with status asthmaticus: Secondary | ICD-10-CM | POA: Diagnosis not present

## 2015-12-13 DIAGNOSIS — B373 Candidiasis of vulva and vagina: Secondary | ICD-10-CM | POA: Diagnosis present

## 2015-12-13 DIAGNOSIS — J411 Mucopurulent chronic bronchitis: Secondary | ICD-10-CM | POA: Diagnosis present

## 2015-12-13 DIAGNOSIS — F4323 Adjustment disorder with mixed anxiety and depressed mood: Secondary | ICD-10-CM | POA: Diagnosis not present

## 2015-12-13 DIAGNOSIS — R0902 Hypoxemia: Secondary | ICD-10-CM

## 2015-12-13 DIAGNOSIS — J4551 Severe persistent asthma with (acute) exacerbation: Secondary | ICD-10-CM

## 2015-12-13 DIAGNOSIS — J9600 Acute respiratory failure, unspecified whether with hypoxia or hypercapnia: Secondary | ICD-10-CM | POA: Diagnosis present

## 2015-12-13 DIAGNOSIS — F329 Major depressive disorder, single episode, unspecified: Secondary | ICD-10-CM | POA: Diagnosis present

## 2015-12-13 DIAGNOSIS — R05 Cough: Secondary | ICD-10-CM | POA: Diagnosis not present

## 2015-12-13 DIAGNOSIS — R739 Hyperglycemia, unspecified: Secondary | ICD-10-CM | POA: Diagnosis present

## 2015-12-13 DIAGNOSIS — F418 Other specified anxiety disorders: Secondary | ICD-10-CM | POA: Diagnosis present

## 2015-12-13 DIAGNOSIS — F419 Anxiety disorder, unspecified: Secondary | ICD-10-CM | POA: Diagnosis present

## 2015-12-13 DIAGNOSIS — J45901 Unspecified asthma with (acute) exacerbation: Secondary | ICD-10-CM | POA: Diagnosis present

## 2015-12-13 DIAGNOSIS — J4521 Mild intermittent asthma with (acute) exacerbation: Secondary | ICD-10-CM | POA: Diagnosis not present

## 2015-12-13 DIAGNOSIS — R000 Tachycardia, unspecified: Secondary | ICD-10-CM | POA: Diagnosis present

## 2015-12-13 DIAGNOSIS — K219 Gastro-esophageal reflux disease without esophagitis: Secondary | ICD-10-CM

## 2015-12-13 HISTORY — DX: Unspecified chronic bronchitis: J42

## 2015-12-13 HISTORY — DX: Pneumonia, unspecified organism: J18.9

## 2015-12-13 LAB — CBC WITH DIFFERENTIAL/PLATELET
BASOS ABS: 0 10*3/uL (ref 0.0–0.1)
BASOS PCT: 0 %
Eosinophils Absolute: 0.6 10*3/uL (ref 0.0–0.7)
Eosinophils Relative: 8 %
HEMATOCRIT: 39.9 % (ref 36.0–46.0)
Hemoglobin: 13 g/dL (ref 12.0–15.0)
LYMPHS PCT: 23 %
Lymphs Abs: 1.8 10*3/uL (ref 0.7–4.0)
MCH: 30.8 pg (ref 26.0–34.0)
MCHC: 32.6 g/dL (ref 30.0–36.0)
MCV: 94.5 fL (ref 78.0–100.0)
Monocytes Absolute: 0.5 10*3/uL (ref 0.1–1.0)
Monocytes Relative: 6 %
NEUTROS ABS: 5 10*3/uL (ref 1.7–7.7)
Neutrophils Relative %: 63 %
PLATELETS: 222 10*3/uL (ref 150–400)
RBC: 4.22 MIL/uL (ref 3.87–5.11)
RDW: 12.8 % (ref 11.5–15.5)
WBC: 8 10*3/uL (ref 4.0–10.5)

## 2015-12-13 LAB — BASIC METABOLIC PANEL
ANION GAP: 7 (ref 5–15)
BUN: 13 mg/dL (ref 6–20)
CHLORIDE: 110 mmol/L (ref 101–111)
CO2: 25 mmol/L (ref 22–32)
Calcium: 8.9 mg/dL (ref 8.9–10.3)
Creatinine, Ser: 0.8 mg/dL (ref 0.44–1.00)
GFR calc non Af Amer: >60 mL/min (ref >60)
GLUCOSE: 86 mg/dL (ref 65–99)
POTASSIUM: 3.6 mmol/L (ref 3.5–5.1)
Sodium: 142 mmol/L (ref 135–145)

## 2015-12-13 LAB — PROCALCITONIN

## 2015-12-13 LAB — MAGNESIUM: Magnesium: 2.1 mg/dL (ref 1.7–2.4)

## 2015-12-13 LAB — PHOSPHORUS: PHOSPHORUS: 2.8 mg/dL (ref 2.5–4.6)

## 2015-12-13 MED ORDER — LEVOFLOXACIN IN D5W 750 MG/150ML IV SOLN
750.0000 mg | Freq: Once | INTRAVENOUS | Status: AC
  Administered 2015-12-13: 750 mg via INTRAVENOUS
  Filled 2015-12-13: qty 150

## 2015-12-13 MED ORDER — SODIUM CHLORIDE 0.9 % IV SOLN: 250.0000 mL | INTRAVENOUS | Status: DC | PRN

## 2015-12-13 MED ORDER — IPRATROPIUM-ALBUTEROL 0.5-2.5 (3) MG/3ML IN SOLN
3.0000 mL | Freq: Four times a day (QID) | RESPIRATORY_TRACT | Status: DC
  Administered 2015-12-13: 3 mL via RESPIRATORY_TRACT
  Filled 2015-12-13: qty 3

## 2015-12-13 MED ORDER — ASPIRIN 300 MG RE SUPP: 300.0000 mg | RECTAL | Status: DC

## 2015-12-13 MED ORDER — ALBUTEROL SULFATE (2.5 MG/3ML) 0.083% IN NEBU: 2.5000 mg | INHALATION_SOLUTION | RESPIRATORY_TRACT | Status: DC | PRN

## 2015-12-13 MED ORDER — MONTELUKAST SODIUM 10 MG PO TABS
10.0000 mg | ORAL_TABLET | Freq: Every day | ORAL | Status: DC
Start: 2015-12-13 — End: 2015-12-16
  Administered 2015-12-13 – 2015-12-15 (×3): 10 mg via ORAL
  Filled 2015-12-13 (×3): qty 1

## 2015-12-13 MED ORDER — CHLORDIAZEPOXIDE HCL 25 MG PO CAPS
25.0000 mg | ORAL_CAPSULE | Freq: Three times a day (TID) | ORAL | Status: DC
  Administered 2015-12-13 – 2015-12-16 (×9): 25 mg via ORAL
  Filled 2015-12-13 (×9): qty 1

## 2015-12-13 MED ORDER — BECLOMETHASONE DIPROPIONATE 80 MCG/ACT IN AERS: 1.0000 | INHALATION_SPRAY | Freq: Two times a day (BID) | RESPIRATORY_TRACT | Status: DC

## 2015-12-13 MED ORDER — LEVOFLOXACIN IN D5W 750 MG/150ML IV SOLN
750.0000 mg | INTRAVENOUS | Status: DC
  Administered 2015-12-14: 750 mg via INTRAVENOUS
  Filled 2015-12-13 (×2): qty 150

## 2015-12-13 MED ORDER — SODIUM CHLORIDE 0.9 % IV SOLN
INTRAVENOUS | Status: DC
  Administered 2015-12-13 – 2015-12-15 (×4): via INTRAVENOUS

## 2015-12-13 MED ORDER — IPRATROPIUM-ALBUTEROL 0.5-2.5 (3) MG/3ML IN SOLN
3.0000 mL | RESPIRATORY_TRACT | Status: DC
  Administered 2015-12-13 – 2015-12-14 (×4): 3 mL via RESPIRATORY_TRACT
  Filled 2015-12-13 (×4): qty 3

## 2015-12-13 MED ORDER — METHYLPREDNISOLONE SODIUM SUCC 125 MG IJ SOLR
60.0000 mg | Freq: Three times a day (TID) | INTRAMUSCULAR | Status: DC
  Administered 2015-12-13 – 2015-12-16 (×9): 60 mg via INTRAVENOUS
  Filled 2015-12-13 (×9): qty 2

## 2015-12-13 MED ORDER — ASPIRIN 325 MG PO TABS
ORAL_TABLET | ORAL | Status: AC
  Administered 2015-12-13: 325 mg
  Filled 2015-12-13: qty 1

## 2015-12-13 MED ORDER — PREDNISONE 10 MG PO TABS: ORAL_TABLET | ORAL | Status: DC

## 2015-12-13 MED ORDER — METHYLPREDNISOLONE ACETATE 80 MG/ML IJ SUSP
120.0000 mg | Freq: Once | INTRAMUSCULAR | Status: AC
Start: 2015-12-13 — End: 2015-12-13
  Administered 2015-12-13: 120 mg via INTRAMUSCULAR

## 2015-12-13 MED ORDER — ASPIRIN 81 MG PO CHEW
324.0000 mg | CHEWABLE_TABLET | ORAL | Status: DC
Start: 2015-12-13 — End: 2015-12-13

## 2015-12-13 MED ORDER — BUDESONIDE-FORMOTEROL FUMARATE 160-4.5 MCG/ACT IN AERO: 2.0000 | INHALATION_SPRAY | Freq: Two times a day (BID) | RESPIRATORY_TRACT | Status: DC

## 2015-12-13 MED ORDER — MIRTAZAPINE 30 MG PO TABS
45.0000 mg | ORAL_TABLET | Freq: Every day | ORAL | Status: DC
  Administered 2015-12-13 – 2015-12-15 (×3): 45 mg via ORAL
  Filled 2015-12-13 (×3): qty 1

## 2015-12-13 MED ORDER — PANTOPRAZOLE SODIUM 40 MG PO TBEC
40.0000 mg | DELAYED_RELEASE_TABLET | Freq: Two times a day (BID) | ORAL | Status: DC
  Administered 2015-12-13 – 2015-12-14 (×2): 40 mg via ORAL
  Filled 2015-12-13 (×2): qty 1

## 2015-12-13 NOTE — Progress Notes (Signed)
ANTIBIOTIC CONSULT NOTE - INITIAL  Pharmacy Consult for Levaquin Indication: CAP  Allergies  Allergen Reactions  . Penicillins Rash    Patient Measurements: Weight: 173 lb 4.8 oz (78.608 kg)  Vital Signs: Temp: 97.8 F (36.6 C) (01/16 1535) Temp Source: Oral (01/16 1535) BP: 117/71 mmHg (01/16 1535) Pulse Rate: 115 (01/16 1535) Intake/Output from previous day:   Intake/Output from this shift:    Labs: No results for input(s): WBC, HGB, PLT, LABCREA, CREATININE in the last 72 hours. CrCl cannot be calculated (Patient has no serum creatinine result on file.). No results for input(s): VANCOTROUGH, VANCOPEAK, VANCORANDOM, GENTTROUGH, GENTPEAK, GENTRANDOM, TOBRATROUGH, TOBRAPEAK, TOBRARND, AMIKACINPEAK, AMIKACINTROU, AMIKACIN in the last 72 hours.   Microbiology: No results found for this or any previous visit (from the past 720 hour(s)).  Medical History: Past Medical History  Diagnosis Date  . PVC (premature ventricular contraction)   . Palpitations   . Pre-syncope   . Tachycardia   . Depression   . Insomnia   . Seasonal allergies   . Asthma   . Anxiety   . Depression   . Insomnia   . GERD (gastroesophageal reflux disease)     Assessment: [age] YOF with frequent asthma exacerbations direct admitted from the pulmonary office on 1/16 due to another acute exacerbation. Pharmacy was consulted to start Levaquin for r/o CAP. SCr 0.8, CrCl~100 ml/min.   Goal of Therapy:  Proper antibiotics for infection/cultures adjusted for renal/hepatic function   Plan:  1. Start Levaquin 750 mg IV every 24 hours 2. Will continue to follow renal function, culture results, LOT, and antibiotic de-escalation plans   Georgina PillionElizabeth Ameila Weldon, PharmD, BCPS Clinical Pharmacist Pager: [phone] 12/13/2015 4:43 PM

## 2015-12-13 NOTE — Progress Notes (Addendum)
Subjective:    Patient ID: Kelsey [name], female    DOB: 1981-02-20, 35 y.o.   MRN: [id number] Synopsis: Patient of Dr. Vassie Loll who has moderate to severe persistent asthma with recurrent exacerbations. Has history of hospitalization for severe asthma exacerbation and pulmonary function testing in the past session a significant bronchodilator response.  HPI Chief Complaint  Patient presents with  . Acute Visit    RA pt c/o asthma exacerbation-c/o wheezing, chest tightness, nonprod cough qhs, fatigue X2 days.     Kelsey [name] has been sick for the last few days.  Started on Saturday night and has ben worse since then.  No fever, no chills.  She says that she has been using her albuterol every 2 hours and is feeling significantly short of breath. She has chest congestion but no mucus production. She has a dry cough. She also has sinus symptoms that are associated with this. No headaches or body aches.  She does not smoke cigarettes.   Past Medical History  Diagnosis Date  . PVC (premature ventricular contraction)   . Palpitations   . Pre-syncope   . Tachycardia   . Depression   . Insomnia   . Seasonal allergies   . Asthma   . Anxiety   . Depression   . Insomnia   . GERD (gastroesophageal reflux disease)       Review of Systems     Objective:   Physical Exam  Filed Vitals:   12/13/15 1108  BP: 118/82  Pulse: 97  Temp: 97.4 F (36.3 C)  TempSrc: Oral  Height: 5\' 4"  (1.626 m)  Weight: 174 lb 9.6 oz (79.198 kg)  SpO2: 98%  RA  Gen: well appearing HENT: OP clear, TM's clear, neck supple PULM: Wheezing bilaterally, good air movement, speaking in full sentences, increased effort but no accessory muscle use CV: RRR, no mgr, trace edema GI: BS+, soft, nontender Derm: no cyanosis or rash Psyche: normal mood and affect  Dr. Vassie Loll and Tammy Parrett's notes reviewed, multiple recent exacerbations  CBC    Component Value Date/Time   WBC 9.8 07/13/2015 2218   RBC 4.85  07/13/2015 2218   HGB 15.1* 07/13/2015 2218   HCT 46.4* 07/13/2015 2218   PLT 354 07/13/2015 2218   MCV 95.7 07/13/2015 2218   MCH 31.1 07/13/2015 2218   MCHC 32.5 07/13/2015 2218   RDW 13.6 07/13/2015 2218   LYMPHSABS 1.9 01/17/2015 0733   MONOABS 0.7 01/17/2015 0733   EOSABS 0.2 01/17/2015 0733   BASOSABS 0.0 01/17/2015 0733          Assessment & Plan:  Asthma exacerbation She is having another exacerbation of her severe persistent asthma, though no signs or symptoms of an infection today as no fever, mucus production, body aches or chills.   She would likely benefit from a biologic therapy or Xolair again.  Plan Solumedrol shot now Albuterol shot now Prednisone taper  CBC with Diff next visit for eosinophils> would she benefit from an IL-5 inhibitor? Go to hospital if no improvement in 24 hours  Addendum: After two albuterol treatments in the office and a solumedrol injections she remains breathless.  Will admit to [facility] for an asthma exacerbation     Current outpatient prescriptions:  .  albuterol (PROAIR HFA) 108 (90 BASE) MCG/ACT inhaler, INHALE 2 PUFFS INTO THE LUNGS EVERY 4 HOURS AS NEEDED (FOR SHORTNESS OF BREATH)., Disp: 8.5 g, Rfl: 5 .  albuterol (PROVENTIL) (2.5 MG/3ML) 0.083% nebulizer solution, Take 3  mLs (2.5 mg total) by nebulization 2 (two) times daily as needed for wheezing or shortness of breath., Disp: 45 mL, Rfl: 3 .  beclomethasone (QVAR) 80 MCG/ACT inhaler, Inhale 1 puff into the lungs 2 (two) times daily., Disp: 1 Inhaler, Rfl: 6 .  budesonide-formoterol (SYMBICORT) 160-4.5 MCG/ACT inhaler, Inhale 2 puffs into the lungs 2 (two) times daily., Disp: 1 Inhaler, Rfl: 6 .  chlordiazePOXIDE (LIBRIUM) 25 MG capsule, Take 25 mg by mouth 3 (three) times daily., Disp: , Rfl:  .  gabapentin (NEURONTIN) 400 MG capsule, Take 1 tablet by mouth when taking prednisone, Disp: , Rfl: 0 .  levonorgestrel (MIRENA) 20 MCG/24HR IUD, 1 Intra Uterine Device (1 each total) by  Intrauterine route once., Disp: 1 each, Rfl: 0 .  mirtazapine (REMERON) 45 MG tablet, Take 1 tablet (45 mg total) by mouth at bedtime., Disp: 30 tablet, Rfl: 0 .  montelukast (SINGULAIR) 10 MG tablet, Take 1 tablet (10 mg total) by mouth at bedtime., Disp: 30 tablet, Rfl: 6 .  pantoprazole (PROTONIX) 40 MG tablet, Take 1 tablet (40 mg total) by mouth daily., Disp: 30 tablet, Rfl: 0 .  traZODone (DESYREL) 100 MG tablet, Take 2 tablets (200 mg total) by mouth at bedtime as needed for sleep., Disp: 30 tablet, Rfl: 0 .  predniSONE (DELTASONE) 10 MG tablet, Take 60mg  prednisone daily for 3 days, then take 40mg  po daily for 3 days, then take 30mg  po daily for 3 days, then take 20mg  po daily for two days, then take 10mg  po daily for 2 days, Disp: 45 tablet, Rfl: 0 .  [DISCONTINUED] atenolol (TENORMIN) 25 MG tablet, Take 25 mg by mouth daily., Disp: , Rfl:  .  [DISCONTINUED] diphenhydrAMINE (BENADRYL) 25 MG tablet, Take by mouth daily as needed.  , Disp: , Rfl:  .  [DISCONTINUED] metoprolol succinate (TOPROL-XL) 25 MG 24 hr tablet, Take 25 mg by mouth daily., Disp: , Rfl:  .  [DISCONTINUED] metoprolol tartrate (LOPRESSOR) 25 MG tablet, Take 1 tablet (25 mg total) by mouth 2 (two) times daily., Disp: 28 tablet, Rfl: 0

## 2015-12-13 NOTE — Progress Notes (Signed)
Kelsey NoseJennifer L [name] [id number] Admission Data: 12/13/2015 5:18 PM Attending Provider: Lupita Leashouglas B McQuaid, MD  WUJ:WJXBJY,NWGNFAPCP:POLITE,RONALD D, MD Consults/ Treatment Team:    Kelsey NoseJennifer L [name] is a 35 y.o. female patient admitted from ED awake, alert  & orientated  X 3,  Full Code, VSS - Blood pressure 117/71, pulse 115, temperature 97.8 F (36.6 C), temperature source Oral, weight 78.608 kg (173 lb 4.8 oz), SpO2 97 %., O2    2 L nasal cannular, c/o shortness of breath, c/o chest tightness , distress noted. Tele # 10 placed and pt is currently running:normal sinus rhythm.   IV site WDL:  forearm right, condition patent and no redness with a transparent dsg that's clean dry and intact.  Allergies:   Allergies  Allergen Reactions  . Penicillins Rash    Has patient had a PCN reaction causing immediate rash, facial/tongue/throat swelling, SOB or lightheadedness with hypotension: No Has patient had a PCN reaction causing severe rash involving mucus membranes or skin necrosis: No Has patient had a PCN reaction that required hospitalization No Has patient had a PCN reaction occurring within the last 10 years: No If all of the above answers are "NO", then may proceed with Cephalosporin use.     Past Medical History  Diagnosis Date  . PVC (premature ventricular contraction)   . Palpitations   . Pre-syncope   . Tachycardia   . Depression   . Insomnia   . Seasonal allergies   . Asthma   . Anxiety   . Depression   . Insomnia   . GERD (gastroesophageal reflux disease)     History:  obtained from patient Tobacco/alcohol: denied none  Pt orientation to unit, room and routine. Information packet given to patient/family and safety video watched.  Admission INP armband ID verified with patient/family, and in place. SR up x 2, fall risk assessment complete with Patient and family verbalizing understanding of risks associated with falls. Pt verbalizes an understanding of how to use the call bell and to call for  help before getting out of bed.  Skin, clean-dry- intact without evidence of bruising, or skin tears.   No evidence of skin break down noted on exam. color normal, vascularity normal, no rashes or suspicious lesions, no evidence of bleeding or bruising, no lesions noted, no rash, no edema, temperature normal, texture normal, mobility and turgor normal, nails normal without clubbing    Will cont to monitor and assist as needed.  Camillo FlamingVicki L Kiriana Worthington, RN 12/13/2015 5:18 PM

## 2015-12-13 NOTE — H&P (Signed)
Name: Kelsey [name] MRN: [id number] DOB: 15-Jun-1981    ADMISSION DATE:  12/13/2015 CONSULTATION DATE:  12/13/2015  REFERRING MD :  Pulmonary office  CHIEF COMPLAINT:  SOB  BRIEF PATIENT DESCRIPTION: 35 year old female, RT here at [facility], never smoker, with PMH severe persistent asthma with frequent exacerbations admitted to [facility] from pulmonary office 1/16 for acute exacerbation of asthma.   SIGNIFICANT EVENTS  1/16 admit  STUDIES:  PFTs - 11/ 2013 showed FEV1 of 1.87-59% improving to 2.24-70% with bronchodilator. Rheumatological evaluation by Dr. Nickola Major, ANA, RA factor was negative sedimentation rate was 3, CPK was 44, thyroid function was normal  RAST 2013 - High titers to cat & dog dander, grass & house dust , high IgE  HISTORY OF PRESENT ILLNESS:  35 year old female with PMH as below, which includes severe persistent asthma followed by Dr. Vassie Loll in the pulmonary office. She has a history of hospitalization for asthma exacerbation. She has asthma since childhood, but it had been under control through her teens and 53s. Flared up again when she turned about 29. Moved into new house in 2009. Does not think this is the cause of flare onset, however, has gone to lengths to address this including removing carpets in favor of hardwoods. Known triggers of asthma are heat, humidity, allergies, and cats. She has had frequent exacerbations in 2016, which have typically responded well to steroids. Currently managed on Symbicort and QVAR  Luke has been sick to the last few days. Started with cold-like symptoms, (sinus congestion, chest congestion, cough, but no mucous congestion) Sat 1/14 and progressed from there. She felt as though she was being choked. She has been using her albuterol every 2 hours without relief of symptoms. Dyspnea became more severe. She presented to the pulmonary office for this complaint. She received 2 albuterol nebulizer treatments and a one time injection of Solu-medrol  125mg . Symptoms only marginally improved, so she was admitted to [facility] for further eval.   PAST MEDICAL HISTORY :   has a past medical history of PVC (premature ventricular contraction); Palpitations; Pre-syncope; Tachycardia; Depression; Insomnia; Seasonal allergies; Asthma; Anxiety; Depression; Insomnia; and GERD (gastroesophageal reflux disease).  has past surgical history that includes Cesarean section (10/15/2009, 02/08/2006). Prior to Admission medications   Medication Sig Start Date End Date Taking? Authorizing Provider  albuterol (PROAIR HFA) 108 (90 BASE) MCG/ACT inhaler INHALE 2 PUFFS INTO THE LUNGS EVERY 4 HOURS AS NEEDED (FOR SHORTNESS OF BREATH). 08/20/15   Oretha Milch, MD  albuterol (PROVENTIL) (2.5 MG/3ML) 0.083% nebulizer solution Take 3 mLs (2.5 mg total) by nebulization 2 (two) times daily as needed for wheezing or shortness of breath. 09/13/15   Oretha Milch, MD  beclomethasone (QVAR) 80 MCG/ACT inhaler Inhale 1 puff into the lungs 2 (two) times daily. 12/13/15   Lupita Leash, MD  budesonide-formoterol [facility]) 160-4.5 MCG/ACT inhaler Inhale 2 puffs into the lungs 2 (two) times daily. 12/13/15   Lupita Leash, MD  chlordiazePOXIDE (LIBRIUM) 25 MG capsule Take 25 mg by mouth 3 (three) times daily.    Historical Provider, MD  gabapentin (NEURONTIN) 400 MG capsule Take 1 tablet by mouth when taking prednisone 09/13/15   Historical Provider, MD  levonorgestrel (MIRENA) 20 MCG/24HR IUD 1 Intra Uterine Device (1 each total) by Intrauterine route once. 07/16/15   Adonis Brook, NP  mirtazapine (REMERON) 45 MG tablet Take 1 tablet (45 mg total) by mouth at bedtime. 07/16/15   Adonis Brook, NP  montelukast (SINGULAIR) 10 MG tablet Take 1 tablet (10 mg total) by mouth at bedtime. 07/16/15   Adonis Brook, NP  pantoprazole (PROTONIX) 40 MG tablet Take 1 tablet (40 mg total) by mouth daily. 07/16/15   Adonis Brook, NP  predniSONE (DELTASONE) 10 MG tablet Take 60mg   prednisone daily for 3 days, then take 40mg  po daily for 3 days, then take 30mg  po daily for 3 days, then take 20mg  po daily for two days, then take 10mg  po daily for 2 days 12/13/15   Lupita Leash, MD  traZODone (DESYREL) 100 MG tablet Take 2 tablets (200 mg total) by mouth at bedtime as needed for sleep. 07/16/15   Adonis Brook, NP   Allergies  Allergen Reactions  . Penicillins Rash    FAMILY HISTORY:  family history includes Heart disease in her father; Lung cancer in her paternal grandmother; Rheumatologic disease in her mother. SOCIAL HISTORY:  reports that she has never smoked. She has never used smokeless tobacco. She reports that she drinks about 12.6 oz of alcohol per week. She reports that she uses illicit drugs (Marijuana).  REVIEW OF SYSTEMS:   Bolds are positive  Constitutional: weight loss, gain, night sweats, Fevers, chills, fatigue .  HEENT: headaches, Sore throat, sneezing, nasal congestion, post nasal drip, Difficulty swallowing, Tooth/dental problems, visual complaints visual changes, ear ache CV:  chest pain, radiates: ,Orthopnea, PND, swelling in lower extremities, dizziness, palpitations, syncope.  GI  heartburn, indigestion, abdominal pain, nausea, vomiting, diarrhea, change in bowel habits, loss of appetite, bloody stools.  Resp: cough, non-productive, but chest feels congested , hemoptysis, dyspnea, chest pain, pleuritic.  Skin: rash or itching or icterus GU: dysuria, change in color of urine, urgency or frequency. flank pain, hematuria  MS: joint pain or swelling. decreased range of motion  Psych: change in mood or affect. depression or anxiety.  Neuro: difficulty with speech, weakness, numbness, ataxia    SUBJECTIVE:   VITAL SIGNS: Temp:  [97.4 F (36.3 C)-97.8 F (36.6 C)] 97.8 F (36.6 C) (01/16 1535) Pulse Rate:  [97-115] 115 (01/16 1535) BP: (117-118)/(71-82) 117/71 mmHg (01/16 1535) SpO2:  [97 %-98 %] 97 % (01/16 1535) Weight:  [78.608 kg (173  lb 4.8 oz)-79.198 kg (174 lb 9.6 oz)] 78.608 kg (173 lb 4.8 oz) (01/16 1535)  PHYSICAL EXAMINATION: General:  Young female in mild resp distress Neuro:  Alert, oriented, non-focal HEENT:  [place]/AT, PERRL, no JVD Cardiovascular:  Tachy, regular, no MRG Lungs:  Expiratory wheeze throughout  Abdomen:  Soft, non-tender, non-distended Musculoskeletal:  No acute deformity or ROM limitations Skin:  Grossly intact.  No results for input(s): NA, K, CL, CO2, BUN, CREATININE, GLUCOSE in the last 168 hours. No results for input(s): HGB, HCT, WBC, PLT in the last 168 hours. No results found.  ASSESSMENT / PLAN:  Acute respiratory failure secondary to acute exacerbation of asthma Doubt CAP - Supplemental O2 2L - CXR - Scheduled duoneb, PRN albuterol - Solumedrol 80mg  q 6 hours. - Close monitoring of respiratory status - Continue singulair  - IS and FV as tolerated - Levaquin, check PCT - Assess BMP, CBC, Mag, Phos now and in AM  GERD Protonix BID, on nexium at home. NPO for now except meds  H/o PVCs, Palpitations -Tele -EKG  Anxiety/Depression Continue home Librium, Remeron Holding home gabapentin  Diet: NPO except meds for now VTE ppx: SCD's (however has IUD in place, so if she is not up and walking around by tomorrow will need to add chemical  ppx) SUP: PO Protonix BID    Joneen Roachaul Hoffman, AGACNP-BC [facility] Pulmonology/Critical Care Pager [phone] or [phone]  12/13/2015 4:21 PM  Attending Note:  35 year old with asthma since childhood who started feeling SOB on the night prior to presentation with significant wheezing.  Patient was seen in the pulmonary office and was noted to have significant SOB and did not respond to breathing treatments in the office.  The patient was referred to the hospital for admission.  PCCM to admit.  Diffuse wheezing on exam.  I reviewed CXR myself with no evidence of acute cardiopulmonary process.  Discussed with Dr. Kendrick FriesMcQuaid and  PCCM-NP.  Acute respiratory failure secondary to acute exacerbation of asthma - Supplemental O2 2L - Titrate O2 for sat of 88-92%. - Scheduled duoneb, PRN albuterol - Increase PRN albuterol to q4 hours. - Solumedrol 80mg  q 6 hours. - Close monitoring of respiratory status. - Continue singulair  - IS per RT protocol. - Levaquin, check PCT - Assess BMP, CBC, Mag, Phos now and in AM  GERD - Protonix BID, on nexium at home. - Regular diet.  H/o PVCs, Palpitations -Tele -EKG  Anxiety/Depression - Continue home Librium, Remeron - Holding home gabapentin  Patient seen and examined, agree with above note.  I dictated the care and orders written for this patient under my direction.  Alyson ReedyWesam G Yacoub, MD [phone]

## 2015-12-13 NOTE — Assessment & Plan Note (Addendum)
She is having another exacerbation of her severe persistent asthma, though no signs or symptoms of an infection today as no fever, mucus production, body aches or chills.   She would likely benefit from a biologic therapy or Xolair again.  Plan Solumedrol shot now Albuterol shot now Prednisone taper  CBC with Diff next visit for eosinophils> would she benefit from an IL-5 inhibitor? Go to hospital if no improvement in 24 hours  Addendum: After two albuterol treatments in the office and a solumedrol injections she remains breathless.  [facility]

## 2015-12-14 DIAGNOSIS — J4551 Severe persistent asthma with (acute) exacerbation: Principal | ICD-10-CM

## 2015-12-14 LAB — CBC
HEMATOCRIT: 38 % (ref 36.0–46.0)
HEMOGLOBIN: 12.6 g/dL (ref 12.0–15.0)
MCH: 31 pg (ref 26.0–34.0)
MCHC: 33.2 g/dL (ref 30.0–36.0)
MCV: 93.4 fL (ref 78.0–100.0)
Platelets: 213 10*3/uL (ref 150–400)
RBC: 4.07 MIL/uL (ref 3.87–5.11)
RDW: 12.8 % (ref 11.5–15.5)
WBC: 9.3 10*3/uL (ref 4.0–10.5)

## 2015-12-14 LAB — GLUCOSE, CAPILLARY
GLUCOSE-CAPILLARY: 158 mg/dL — AB (ref 65–99)
Glucose-Capillary: 119 mg/dL — ABNORMAL HIGH (ref 65–99)
Glucose-Capillary: 142 mg/dL — ABNORMAL HIGH (ref 65–99)
Glucose-Capillary: 149 mg/dL — ABNORMAL HIGH (ref 65–99)

## 2015-12-14 LAB — BASIC METABOLIC PANEL
Anion gap: 8 (ref 5–15)
BUN: 10 mg/dL (ref 6–20)
CHLORIDE: 111 mmol/L (ref 101–111)
CO2: 19 mmol/L — AB (ref 22–32)
CREATININE: 0.72 mg/dL (ref 0.44–1.00)
Calcium: 9 mg/dL (ref 8.9–10.3)
GFR calc Af Amer: >60 mL/min (ref >60)
GFR calc non Af Amer: >60 mL/min (ref >60)
GLUCOSE: 185 mg/dL — AB (ref 65–99)
Potassium: 4.1 mmol/L (ref 3.5–5.1)
SODIUM: 138 mmol/L (ref 135–145)

## 2015-12-14 LAB — BETA-HYDROXYBUTYRIC ACID: Beta-Hydroxybutyric Acid: 0.08 mmol/L (ref 0.05–0.27)

## 2015-12-14 MED ORDER — INSULIN ASPART 100 UNIT/ML ~~LOC~~ SOLN
0.0000 [IU] | Freq: Three times a day (TID) | SUBCUTANEOUS | Status: DC
  Administered 2015-12-14: 1 [IU] via SUBCUTANEOUS
  Administered 2015-12-15: 2 [IU] via SUBCUTANEOUS

## 2015-12-14 MED ORDER — PANTOPRAZOLE SODIUM 40 MG IV SOLR
40.0000 mg | Freq: Two times a day (BID) | INTRAVENOUS | Status: DC
  Administered 2015-12-14 – 2015-12-15 (×3): 40 mg via INTRAVENOUS
  Filled 2015-12-14 (×3): qty 40

## 2015-12-14 MED ORDER — IPRATROPIUM-ALBUTEROL 0.5-2.5 (3) MG/3ML IN SOLN
RESPIRATORY_TRACT | Status: AC
  Filled 2015-12-14: qty 3

## 2015-12-14 MED ORDER — INSULIN ASPART 100 UNIT/ML ~~LOC~~ SOLN: 0.0000 [IU] | Freq: Every day | SUBCUTANEOUS | Status: DC

## 2015-12-14 MED ORDER — TRAZODONE HCL 100 MG PO TABS
200.0000 mg | ORAL_TABLET | Freq: Every day | ORAL | Status: DC
  Administered 2015-12-14 – 2015-12-15 (×2): 200 mg via ORAL
  Filled 2015-12-14 (×2): qty 2

## 2015-12-14 MED ORDER — ENOXAPARIN SODIUM 40 MG/0.4ML ~~LOC~~ SOLN
40.0000 mg | SUBCUTANEOUS | Status: DC
  Filled 2015-12-14 (×2): qty 0.4

## 2015-12-14 MED ORDER — IPRATROPIUM-ALBUTEROL 0.5-2.5 (3) MG/3ML IN SOLN
3.0000 mL | RESPIRATORY_TRACT | Status: DC
  Administered 2015-12-14 – 2015-12-16 (×11): 3 mL via RESPIRATORY_TRACT
  Filled 2015-12-14 (×12): qty 3

## 2015-12-14 MED ORDER — FLUTICASONE PROPIONATE 50 MCG/ACT NA SUSP
2.0000 | Freq: Two times a day (BID) | NASAL | Status: DC
  Administered 2015-12-14 – 2015-12-16 (×5): 2 via NASAL
  Filled 2015-12-14: qty 16

## 2015-12-14 NOTE — Progress Notes (Signed)
   Name: Kelsey [name] MRN: [id number] DOB: [birth year]    ADMISSION DATE:  12/13/2015 CONSULTATION DATE:  12/13/2015  REFERRING MD :  Pulmonary office  CHIEF COMPLAINT:  SOB  BRIEF PATIENT DESCRIPTION: 35 year old female, RT here at [facility], never smoker, with PMH severe persistent asthma with frequent exacerbations admitted to [facility] from pulmonary office 1/16 for acute exacerbation of asthma.   SIGNIFICANT EVENTS  1/16 admit  STUDIES:  PFTs - 11/ 2013 showed FEV1 of 1.87-59% improving to 2.24-70% with bronchodilator. Rheumatological evaluation by Dr. Nickola Major, ANA, RA factor was negative sedimentation rate was 3, CPK was 44, thyroid function was normal  RAST 2013 - High titers to cat & dog dander, grass & house dust , high IgE   SUBJECTIVE:  Feels better, but still very SOB VITAL SIGNS: Temp:  [97.4 F (36.3 C)-98.9 F (37.2 C)] 97.5 F (36.4 C) (01/17 0533) Pulse Rate:  [96-115] 96 (01/17 0533) Resp:  [18-22] 22 (01/17 0533) BP: (113-129)/(61-82) 129/78 mmHg (01/17 0533) SpO2:  [94 %-100 %] 96 % (01/17 0813) Weight:  [78.608 kg (173 lb 4.8 oz)-79.198 kg (174 lb 9.6 oz)] 78.608 kg (173 lb 4.8 oz) (01/16 1535) Room air  PHYSICAL EXAMINATION: General:  Young female in no distress but still SOB w/ exertion  Neuro:  Alert, oriented, non-focal HEENT:  [place]/AT, PERRL, no JVD Cardiovascular:  Tachy, regular, no MRG Lungs:  Expiratory wheeze improved. No accessory muscle use  Abdomen:  Soft, non-tender, non-distended Musculoskeletal:  No acute deformity or ROM limitations Skin:  Grossly intact.   Recent Labs Lab 12/13/15 1720 12/14/15 0730  NA 142 138  K 3.6 4.1  CL 110 111  CO2 25 19*  BUN 13 10  CREATININE 0.80 0.72  GLUCOSE 86 185*    Recent Labs Lab 12/13/15 1720 12/14/15 0730  PROCALCITON <0.10  --   WBC 8.0 9.3    Recent Labs Lab 12/13/15 1720 12/14/15 0730  HGB 13.0 12.6  HCT 39.9 38.0  WBC 8.0 9.3  PLT 222 213   Dg Chest Port 1 View  12/13/2015   CLINICAL DATA:  35 year old female with history of asthma, cough and dyspnea. EXAM: PORTABLE CHEST 1 VIEW COMPARISON:  Chest x-ray 10/28/2015. FINDINGS: Lung volumes are normal. No consolidative airspace disease. No pleural effusions. No pneumothorax. No pulmonary nodule or mass noted. Pulmonary vasculature and the cardiomediastinal silhouette are within normal limits. IMPRESSION: No radiographic evidence of acute cardiopulmonary disease. Electronically Signed   By: Trudie Reed M.D.   On: 12/13/2015 16:55    ASSESSMENT / PLAN:  Acute respiratory failure secondary to acute exacerbation of asthma/ purulent bronchitis  - Scheduled duoneb, PRN albuterol - Solumedrol 60 mg q 6 hours (will hold here) - Close monitoring of respiratory status - Continue singulair  - IS and FV as tolerated - Levaquin day 1/5  Mild Metabolic acidosis Plan Ck beta-hydroxybutyric acid given hyperglycemia Repeat chemistry   GERD Protonix BID, on nexium at home-->will cont  NPO for now except meds  Sinus tachycardia H/o PVCs, Palpitations -Tele - cont IVFs  Anxiety/Depression Continue home Librium, Remeron Holding home gabapentin Resume trazodone-->200 qhs    Hyperglycemia  Plan Start SSI while on higher doses of steroids   Diet: adv diet  VTE ppx: add [place] LMWH SUP: PO Protonix BID    Simonne Martinet ACNP-BC [facility] Pulmonary/Critical Care Pager # [phone] OR # [phone] if no answer

## 2015-12-14 NOTE — Care Management Note (Signed)
Case Management Note  Patient Details  Name: Kelsey [name] MRN: [id number] Date of Birth: [birth year]  Subjective/Objective:                 Admitted with SOB. From home with husband/children. Independent with ADL's. No DME usage.    Action/Plan: Return to home when medically stable. CM to f/u with disposition needs.  Expected Discharge Date:                  Expected Discharge Plan:  Home/Self Care  In-House Referral:     Discharge planning Services  CM Consult  Post Acute Care Choice:    Choice offered to:     DME Arranged:    DME Agency:     HH Arranged:    HH Agency:     Status of Service:  In process, will continue to follow  Medicare Important Message Given:    Date Medicare IM Given:    Medicare IM give by:    Date Additional Medicare IM Given:    Additional Medicare Important Message give by:     If discussed at Long Length of Stay Meetings, dates discussed:    Additional Comments: Jamison Neighbor (Spouse)  [phone]  Gae Gallop [place], [place] [phone] 12/14/2015, 7:29 PM

## 2015-12-15 ENCOUNTER — Encounter: Payer: Self-pay | Admitting: *Deleted

## 2015-12-15 ENCOUNTER — Telehealth: Payer: Self-pay | Admitting: Pulmonary Disease

## 2015-12-15 DIAGNOSIS — J45901 Unspecified asthma with (acute) exacerbation: Secondary | ICD-10-CM

## 2015-12-15 LAB — GLUCOSE, CAPILLARY
GLUCOSE-CAPILLARY: 116 mg/dL — AB (ref 65–99)
GLUCOSE-CAPILLARY: 100 mg/dL — AB (ref 65–99)
GLUCOSE-CAPILLARY: 166 mg/dL — AB (ref 65–99)
GLUCOSE-CAPILLARY: 166 mg/dL — AB (ref 65–99)
Glucose-Capillary: 118 mg/dL — ABNORMAL HIGH (ref 65–99)

## 2015-12-15 MED ORDER — LEVOFLOXACIN 750 MG PO TABS
750.0000 mg | ORAL_TABLET | ORAL | Status: DC
  Administered 2015-12-15: 750 mg via ORAL
  Filled 2015-12-15: qty 1

## 2015-12-15 MED ORDER — SODIUM CHLORIDE 0.9 % IV SOLN: 250.0000 mL | INTRAVENOUS | Status: DC | PRN

## 2015-12-15 MED ORDER — SODIUM CHLORIDE 0.9 % IJ SOLN
3.0000 mL | Freq: Two times a day (BID) | INTRAMUSCULAR | Status: DC
  Administered 2015-12-15 – 2015-12-16 (×3): 3 mL via INTRAVENOUS

## 2015-12-15 MED ORDER — HYDROCOD POLST-CPM POLST ER 10-8 MG/5ML PO SUER
5.0000 mL | Freq: Two times a day (BID) | ORAL | Status: DC | PRN
  Administered 2015-12-15 – 2015-12-16 (×3): 5 mL via ORAL
  Filled 2015-12-15 (×3): qty 5

## 2015-12-15 MED ORDER — SODIUM CHLORIDE 0.9 % IJ SOLN: 3.0000 mL | INTRAMUSCULAR | Status: DC | PRN

## 2015-12-15 NOTE — Telephone Encounter (Signed)
Spoke with pt. States that BQ admitted her yesterday due to an asthma exacerbation. She will need a letter sent over to [employer] for her FMLA. The letter will need to state that she will need to be out of work for 2-3 times per month for her asthma. This letter will need to be sent to [employer] at [phone] (fax).  RA - please advise. Thanks.

## 2015-12-15 NOTE — Telephone Encounter (Signed)
Letter has been written and placed in RA's look at to be signed.

## 2015-12-15 NOTE — Telephone Encounter (Signed)
I have signed FMLA papers already - pl find out if additional letter required  OK to send letter if needed

## 2015-12-15 NOTE — Progress Notes (Signed)
UR COMPLETED  

## 2015-12-15 NOTE — Progress Notes (Signed)
Name: Kelsey [name] MRN: [id number] DOB: March 26, 1981    ADMISSION DATE:  12/13/2015 CONSULTATION DATE:  12/13/2015  REFERRING MD :  Pulmonary office  CHIEF COMPLAINT:  SOB  BRIEF PATIENT DESCRIPTION: 35 year old female, RT here at [facility], never smoker, with PMH severe persistent asthma with frequent exacerbations admitted to [facility] from pulmonary office 1/16 for acute exacerbation of asthma.   SIGNIFICANT EVENTS  1/16 admit  STUDIES:  PFTs - 11/ 2013 showed FEV1 of 1.87-59% improving to 2.24-70% with bronchodilator. Rheumatological evaluation by Dr. Nickola Major, ANA, RA factor was negative sedimentation rate was 3, CPK was 44, thyroid function was normal  RAST 2013 - High titers to cat & dog dander, grass & house dust , high IgE   SUBJECTIVE:  Feels better, but still very SOB. Unable to walk to bathroom without profound DOE. Coughing spells which are making it difficult for her to breathe.  VITAL SIGNS: Temp:  [98 F (36.7 C)-98.3 F (36.8 C)] 98 F (36.7 C) (01/18 0533) Pulse Rate:  [104-120] 104 (01/18 0533) Resp:  [16-18] 16 (01/18 0533) BP: (104-117)/(56-75) 104/56 mmHg (01/18 0533) SpO2:  [95 %-98 %] 95 % (01/18 0533) Weight:  [80.1 kg (176 lb 9.4 oz)] 80.1 kg (176 lb 9.4 oz) (01/18 0533) Room air   PHYSICAL EXAMINATION: General:  Young female in no distress but still SOB w/ exertion  Neuro:  Alert, oriented, non-focal HEENT:  [place]/AT, PERRL, no JVD Cardiovascular:  Tachy, regular, no MRG Lungs:  Expiratory wheeze improved. No accessory muscle use  Abdomen:  Soft, non-tender, non-distended Musculoskeletal:  No acute deformity or ROM limitations Skin:  Grossly intact.   Recent Labs Lab 12/13/15 1720 12/14/15 0730  NA 142 138  K 3.6 4.1  CL 110 111  CO2 25 19*  BUN 13 10  CREATININE 0.80 0.72  GLUCOSE 86 185*    Recent Labs Lab 12/13/15 1720 12/14/15 0730  PROCALCITON <0.10  --   WBC 8.0 9.3    Recent Labs Lab 12/13/15 1720 12/14/15 0730  HGB 13.0  12.6  HCT 39.9 38.0  WBC 8.0 9.3  PLT 222 213   Dg Chest Port 1 View  12/13/2015  CLINICAL DATA:  35 year old female with history of asthma, cough and dyspnea. EXAM: PORTABLE CHEST 1 VIEW COMPARISON:  Chest x-ray 10/28/2015. FINDINGS: Lung volumes are normal. No consolidative airspace disease. No pleural effusions. No pneumothorax. No pulmonary nodule or mass noted. Pulmonary vasculature and the cardiomediastinal silhouette are within normal limits. IMPRESSION: No radiographic evidence of acute cardiopulmonary disease. Electronically Signed   By: Trudie Reed M.D.   On: 12/13/2015 16:55    ASSESSMENT / PLAN:  Acute respiratory failure secondary to acute exacerbation of asthma/ purulent bronchitis  - Scheduled duoneb, PRN albuterol - Solumedrol 60 mg q 6 hours > keep dose here for today, hope to wean tomorrow - Continue singulair  - IS and FV as tolerated - Levaquin day 3/5 - Will need prescription for new nebulizer machine at time of discharge  Cough - Add tussionex  Mild Metabolic acidosis Plan -Ck beta-hydroxybutyric acid > nml -Repeat chemistry, CBC 1/19  GERD -DC protonix, brought home nexium -Advance diet as tolerated  Sinus tachycardia H/o PVCs, Palpitations -Tele - cont IVFs  Anxiety/Depression -Continue home Librium, Remeron -Holding home gabapentin -Resume trazodone-->200 qhs    Hyperglycemia  Plan -Start SSI while on higher doses of steroids  Diet: adv diet  VTE ppx: add [place] LMWH SUP: Brought Nexium from home  [place]  Cecille Aver [facility] Pulmonology/Critical Care Pager [phone] or [phone]  12/15/2015 10:49 AM

## 2015-12-15 NOTE — Telephone Encounter (Signed)
When I spoke with pt today she said that she needed a letter stating that she will require 2-3 episodes per month to be out on FMLA for her asthma. Will write letter to the best of my ability.

## 2015-12-16 ENCOUNTER — Encounter: Payer: Self-pay | Admitting: Adult Health

## 2015-12-16 LAB — CBC
HEMATOCRIT: 37.1 % (ref 36.0–46.0)
Hemoglobin: 12.1 g/dL (ref 12.0–15.0)
MCH: 30.9 pg (ref 26.0–34.0)
MCHC: 32.6 g/dL (ref 30.0–36.0)
MCV: 94.6 fL (ref 78.0–100.0)
Platelets: 237 10*3/uL (ref 150–400)
RBC: 3.92 MIL/uL (ref 3.87–5.11)
RDW: 13.2 % (ref 11.5–15.5)
WBC: 17.2 10*3/uL — ABNORMAL HIGH (ref 4.0–10.5)

## 2015-12-16 LAB — BASIC METABOLIC PANEL
Anion gap: 6 (ref 5–15)
BUN: 12 mg/dL (ref 6–20)
CALCIUM: 8.6 mg/dL — AB (ref 8.9–10.3)
CO2: 24 mmol/L (ref 22–32)
CREATININE: 0.68 mg/dL (ref 0.44–1.00)
Chloride: 110 mmol/L (ref 101–111)
GFR calc Af Amer: >60 mL/min (ref >60)
GLUCOSE: 124 mg/dL — AB (ref 65–99)
POTASSIUM: 4 mmol/L (ref 3.5–5.1)
Sodium: 140 mmol/L (ref 135–145)

## 2015-12-16 LAB — GLUCOSE, CAPILLARY: Glucose-Capillary: 117 mg/dL — ABNORMAL HIGH (ref 65–99)

## 2015-12-16 MED ORDER — HYDROCOD POLST-CPM POLST ER 10-8 MG/5ML PO SUER: 5.0000 mL | Freq: Two times a day (BID) | ORAL | Status: DC | PRN

## 2015-12-16 MED ORDER — BUDESONIDE-FORMOTEROL FUMARATE 160-4.5 MCG/ACT IN AERO: 2.0000 | INHALATION_SPRAY | Freq: Two times a day (BID) | RESPIRATORY_TRACT | Status: DC

## 2015-12-16 MED ORDER — BECLOMETHASONE DIPROPIONATE 80 MCG/ACT IN AERS: 1.0000 | INHALATION_SPRAY | Freq: Two times a day (BID) | RESPIRATORY_TRACT | Status: DC

## 2015-12-16 MED ORDER — PREDNISONE 10 MG PO TABS: ORAL_TABLET | ORAL | Status: DC

## 2015-12-16 MED ORDER — LEVOFLOXACIN 750 MG PO TABS: 750.0000 mg | ORAL_TABLET | ORAL | Status: DC

## 2015-12-16 MED ORDER — ALBUTEROL SULFATE (2.5 MG/3ML) 0.083% IN NEBU: 2.5000 mg | INHALATION_SOLUTION | RESPIRATORY_TRACT | Status: DC | PRN

## 2015-12-16 MED ORDER — MONTELUKAST SODIUM 10 MG PO TABS: 10.0000 mg | ORAL_TABLET | Freq: Every day | ORAL | Status: DC

## 2015-12-16 MED ORDER — ESOMEPRAZOLE MAGNESIUM 20 MG PO PACK
20.0000 mg | PACK | Freq: Every day | ORAL | Status: DC
Start: 2015-12-16 — End: 2017-04-23

## 2015-12-16 MED ORDER — FLUCONAZOLE 100 MG PO TABS: 100.0000 mg | ORAL_TABLET | Freq: Every day | ORAL | Status: DC

## 2015-12-16 MED ORDER — FLUCONAZOLE 100 MG PO TABS
200.0000 mg | ORAL_TABLET | Freq: Once | ORAL | Status: AC
  Administered 2015-12-16: 200 mg via ORAL
  Filled 2015-12-16: qty 2

## 2015-12-16 NOTE — Progress Notes (Signed)
12/16/15 Patient to be discharged home today, IV site removed and telemetry removed, and discharge instructions reviewed with patient.

## 2015-12-16 NOTE — Telephone Encounter (Signed)
Letter signed by RA and faxed to [employer] per patient request.

## 2015-12-16 NOTE — Progress Notes (Signed)
CM received consult for DME: nebulizer. CM spoke with pt and pt selected [facility]/DME for provider. CM made referral with [facility] for nebulizer. Nebulizer to be delivered to pt 's room prior to d/c. Gae Gallop RN,BSN,CM [phone]

## 2015-12-16 NOTE — Telephone Encounter (Signed)
Fax completed. Patient advised.

## 2015-12-16 NOTE — Discharge Summary (Signed)
Physician Discharge Summary  Patient ID: Kelsey [name] MRN: [id number] DOB/AGE: Nov 22, 1981 35 y.o.  Admit date: 12/13/2015 Discharge date: 12/16/2015    Discharge Diagnoses:  Principal Problem:   Acute asthma exacerbation                                                       D/c plan by Discharge Diagnosis  Acute respiratory failure secondary to acute exacerbation of asthma PLAN -  Po levaquin x 3 more days for total 7 days abx  PO prednisone with slow taper  Resume home singulair, symbicort, QVAR  PRN albuterol -- case management to arrange new home neb machine  Pulmonary hygiene  Avoid triggers as able  PRN tussionex  outpt pulm f/u 1 week   GERD  PLAN -  cont home nexium   Vaginal candidiasis - r/t abx.  PLAN -  Diflucan 227m po x1 then 1081mpo daily x 6 more days.   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 AlElsworth Sohoith history of multiple hospitalizations for asthma exacerbation.  Presented 1/16 with 3-4 day hx cold-like symptoms which progressed to significant increase in SOB despite q2h albuterol.  She was seen for acute visit in pulmonary office where she received 2 nebulizer treatments and injection of solumedrol with minimal improvement and she was [facility]  She was treated for acute exacerbation of asthma and URI with IV steroids, nebulized BD's and IV abx.  Procalcitonin and culture data were negative.  She improved slowly and was transitioned to PO steroids and PO levaquin.  She remains dyspneic on exertion but is much improved and nearing baseline respiratory status.  She is cleared for d/c home and agrees to "take it easy" for the next few days.  She will f/u in pulmonary office in 1 week.    Consults: none  Lines/tubes: none  Microbiology/Sepsis  markers: Pct = <0.10 BCx2 1/16>>> ngtd>>>  Significant Diagnostic Studies:  1/16 CXR>>neg acute     Filed Vitals:   12/16/15 0052 12/16/15 0500 12/16/15 0538 12/16/15 0749  BP:   116/61   Pulse:   104   Temp:   98.1 F (36.7 C)   TempSrc:   Oral   Resp:   18   Height:      Weight:  178 lb (80.74 kg)    SpO2: 98%  94% 99%     Discharge Labs  BMET  Recent Labs Lab 12/13/15 1720 12/14/15 0730 12/16/15 0530  NA 142 138 140  K 3.6 4.1 4.0  CL 110 111 110  CO2 25 19* 24  GLUCOSE 86 185* 124*  BUN 13 10 12   CREATININE 0.80 0.72 0.68  CALCIUM 8.9 9.0 8.6*  MG 2.1  --   --   PHOS 2.8  --   --      CBC   Recent Labs Lab 12/13/15 1720 12/14/15 0730 12/16/15 0530  HGB 13.0 12.6 12.1  HCT 39.9 38.0 37.1  WBC 8.0 9.3 17.2*  PLT 222 213 237   Anti-Coagulation No results for input(s): INR in the last 168 hours.    Discharge  Instructions    Call MD for:  difficulty breathing, headache or visual disturbances    Complete by:  As directed      Call MD for:  temperature >100.4    Complete by:  As directed      Diet general    Complete by:  As directed      Increase activity slowly    Complete by:  As directed               Follow-up Information    Follow up with Kandice Hams, MD.   Specialty:  Internal Medicine   Contact information:   [address] [place] [address] [phone]       Follow up with Darlina Sicilian, NP On 12/22/2015.   Specialty:  Pulmonary Disease   Why:  [place] [facility] -- 9:00am    Contact information:   [place] [address] [phone]         Medication List    STOP taking these medications        pantoprazole 40 MG tablet  Commonly known as:  PROTONIX      TAKE these medications        albuterol 108 (90 Base) MCG/ACT inhaler  Commonly known as:  PROAIR HFA  INHALE 2 PUFFS INTO THE LUNGS EVERY 4 HOURS AS NEEDED (FOR SHORTNESS OF BREATH).     albuterol (2.5 MG/3ML)  0.083% nebulizer solution  Commonly known as:  PROVENTIL  Take 3 mLs (2.5 mg total) by nebulization every 4 (four) hours as needed for wheezing or shortness of breath.     beclomethasone 80 MCG/ACT inhaler  Commonly known as:  QVAR  Inhale 1 puff into the lungs 2 (two) times daily.     budesonide-formoterol 160-4.5 MCG/ACT inhaler  Commonly known as:  SYMBICORT  Inhale 2 puffs into the lungs 2 (two) times daily.     chlordiazePOXIDE 25 MG capsule  Commonly known as:  LIBRIUM  Take 25 mg by mouth 3 (three) times daily.     chlorpheniramine-HYDROcodone 10-8 MG/5ML Suer  Commonly known as:  TUSSIONEX  Take 5 mLs by mouth every 12 (twelve) hours as needed for cough.     esomeprazole 20 MG packet  Commonly known as:  NEXIUM  Take 20 mg by mouth daily before breakfast.     fluconazole 100 MG tablet  Commonly known as:  DIFLUCAN  Take 1 tablet (100 mg total) by mouth daily.     gabapentin 400 MG capsule  Commonly known as:  NEURONTIN  Take 400 mg by mouth 4 (four) times daily.     levofloxacin 750 MG tablet  Commonly known as:  LEVAQUIN  Take 1 tablet (750 mg total) by mouth daily.     levonorgestrel 20 MCG/24HR IUD  Commonly known as:  MIRENA  1 Intra Uterine Device (1 each total) by Intrauterine route once.     mirtazapine 45 MG tablet  Commonly known as:  REMERON  Take 1 tablet (45 mg total) by mouth at bedtime.     montelukast 10 MG tablet  Commonly known as:  SINGULAIR  Take 1 tablet (10 mg total) by mouth at bedtime.     predniSONE 10 MG tablet  Commonly known as:  DELTASONE  Take 32m prednisone daily for 3 days, then take 463mpo daily for 3 days, then take 3084mo daily for 3 days, then take 74m25m daily for two days, then take 10mg75m  daily for 2 days     traZODone 100 MG tablet  Commonly known as:  DESYREL  Take 2 tablets (200 mg total) by mouth at bedtime as needed for sleep.          Disposition: 01-Home or Self Care  Discharged Condition:  Kelsey [name] has met maximum benefit of inpatient care and is medically stable and cleared for discharge.  Patient is pending follow up as above.      Time spent on disposition:  Greater than 35 minutes.   SignedNickolas Madrid, NP 12/16/2015  9:20 AM Pager: [phone] or [phone]

## 2015-12-17 ENCOUNTER — Telehealth: Payer: Self-pay | Admitting: Pulmonary Disease

## 2015-12-17 NOTE — Telephone Encounter (Signed)
Okay to give such a note

## 2015-12-17 NOTE — Telephone Encounter (Signed)
Spoke with pt, aware of note.  Requests note be mailed to her.  Verified address on file.  Letter typed and sent.  Nothing further needed.

## 2015-12-17 NOTE — Telephone Encounter (Signed)
Spoke with pt. States that when she was discharged from the hospital Katie gave her a work note to not return until Monday 12/20/15. Her next scheduled day to work is 12/23/15. For FMLA she will need a work note stating she should not return to work until 12/23/15.  RA - would you write this work note? Thanks.

## 2015-12-18 LAB — CULTURE, BLOOD (ROUTINE X 2)
CULTURE: NO GROWTH
Culture: NO GROWTH

## 2015-12-22 ENCOUNTER — Encounter: Payer: Self-pay | Admitting: Adult Health

## 2015-12-22 ENCOUNTER — Ambulatory Visit (INDEPENDENT_AMBULATORY_CARE_PROVIDER_SITE_OTHER): Payer: 59 | Admitting: Adult Health

## 2015-12-22 VITALS — BP 128/86 | HR 89 | Ht 64.0 in | Wt 178.0 lb

## 2015-12-22 DIAGNOSIS — J4550 Severe persistent asthma, uncomplicated: Secondary | ICD-10-CM

## 2015-12-22 NOTE — Progress Notes (Signed)
Chief Complaint  Patient presents with  . Hospitalization Follow-up    Admit 1/16-1/19 to [facility] for Asthma Exacerbation. Notes a few flares since d/c. Tapering Prednisone - current dose .      Tests RAST 2013 - High titers to cat & dog dander, grass & house dust  PFTs - November 2013 showed FEV1 of 1.87-59% improving to 2.24-70% with bronchodilator.   Past medical hx Past Medical History  Diagnosis Date  . PVC (premature ventricular contraction)   . Palpitations   . Pre-syncope   . Tachycardia   . Depression   . Seasonal allergies   . Asthma   . Anxiety   . Depression   . Insomnia   . GERD (gastroesophageal reflux disease)   . Pneumonia     "multiple times when I was a child; once as an adult so far" (12/13/2015)  . Chronic bronchitis (HCC)     "get it pretty much q yr" (12/13/2015)     Past surgical hx, Allergies, Family hx, Social hx all reviewed.  Current Outpatient Prescriptions on File Prior to Visit  Medication Sig  . albuterol (PROAIR HFA) 108 (90 BASE) MCG/ACT inhaler INHALE 2 PUFFS INTO THE LUNGS EVERY 4 HOURS AS NEEDED (FOR SHORTNESS OF BREATH).  Marland Kitchen albuterol (PROVENTIL) (2.5 MG/3ML) 0.083% nebulizer solution Take 3 mLs (2.5 mg total) by nebulization every 4 (four) hours as needed for wheezing or shortness of breath.  . beclomethasone (QVAR) 80 MCG/ACT inhaler Inhale 1 puff into the lungs 2 (two) times daily.  . budesonide-formoterol (SYMBICORT) 160-4.5 MCG/ACT inhaler Inhale 2 puffs into the lungs 2 (two) times daily.  . chlordiazePOXIDE (LIBRIUM) 25 MG capsule Take 25 mg by mouth 3 (three) times daily.  . chlorpheniramine-HYDROcodone (TUSSIONEX) 10-8 MG/5ML SUER Take 5 mLs by mouth every 12 (twelve) hours as needed for cough.  . esomeprazole (NEXIUM) 20 MG packet Take 20 mg by mouth daily before breakfast.  . gabapentin (NEURONTIN) 400 MG capsule Take 400 mg by mouth 4 (four) times daily.   Marland Kitchen levonorgestrel (MIRENA) 20 MCG/24HR IUD 1 Intra Uterine Device (1  each total) by Intrauterine route once.  . mirtazapine (REMERON) 45 MG tablet Take 1 tablet (45 mg total) by mouth at bedtime.  . montelukast (SINGULAIR) 10 MG tablet Take 1 tablet (10 mg total) by mouth at bedtime.  . predniSONE (DELTASONE) 10 MG tablet Take  prednisone daily for 3 days, then take  po daily for 3 days, then take  po daily for 3 days, then take  po daily for two days, then take  po daily for 2 days  . traZODone (DESYREL) 100 MG tablet Take 2 tablets (200 mg total) by mouth at bedtime as needed for sleep. (Patient taking differently: Take 200 mg by mouth at bedtime. )  . [DISCONTINUED] atenolol (TENORMIN) 25 MG tablet Take 25 mg by mouth daily.  . [DISCONTINUED] diphenhydrAMINE (BENADRYL) 25 MG tablet Take by mouth daily as needed.    . [DISCONTINUED] metoprolol succinate (TOPROL-XL) 25 MG 24 hr tablet Take 25 mg by mouth daily.  . [DISCONTINUED] metoprolol tartrate (LOPRESSOR) 25 MG tablet Take 1 tablet (25 mg total) by mouth 2 (two) times daily.   No current facility-administered medications on file prior to visit.     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  Recently hospitalized 1/16-1/19 with acute respiratory failure secondary to acute exacerbation of asthma.  She was treated with IV steroids, abx.  She returns today for hospital follow up.  She is feeling much better.  Still with occasional cough/wheeze at night which is improves with nebulizer and tussionex. She denies SOB at rest or with normal daily activities. Does c/o some side effects r/t prednisone as she has experiences in the past such as jittery, body aches, hunger.  None are severe and happen often with prednisone.  She did get new nebulizer machine  on discharge.  She remains concerned about her job status after being out last week.    Physical Exam  General - No distress  ENT - No sinus tenderness, no oral exudate, no LAN Cardiac - s1s2 regular, no murmur Chest - resps even non labored, speaks comfortably in full sentences, No wheeze/rales/dullness Back - No focal tenderness Abd - Soft, non-tender Ext - No edema Neuro - Normal strength Skin - No rashes Psych - normal mood, and behavior   Assessment/Plan  Asthma - severe persistent  Patient Instructions  Glad you are feeling better after leaving the hospital!   Continue current medications as discussed.    Ok to "speed up" prednisone taper to avoid side effects as your breathing tolerates - can do  x 2 days then  x 2 days then  x 2 days then stop.  Follow up with Dr. Vassie Loll or Tammy in 2 months.   Call or return sooner if needed.      Dirk Dress, NP 12/22/2015  9:25 AM Pager: [phone]

## 2015-12-22 NOTE — Patient Instructions (Signed)
Glad you are feeling better after leaving the hospital!   Continue current medications as discussed.    Ok to "speed up" prednisone taper to avoid side effects as your breathing tolerates - can do  x 2 days then  x 2 days then  x 2 days then stop.  Follow up with Dr. Vassie Loll or Tammy in 2 months.   Call or return sooner if needed.

## 2015-12-27 NOTE — Progress Notes (Signed)
Reviewed & agree with plan  

## 2015-12-28 DIAGNOSIS — K219 Gastro-esophageal reflux disease without esophagitis: Secondary | ICD-10-CM | POA: Diagnosis not present

## 2015-12-28 DIAGNOSIS — M25512 Pain in left shoulder: Secondary | ICD-10-CM | POA: Diagnosis not present

## 2015-12-28 DIAGNOSIS — G4700 Insomnia, unspecified: Secondary | ICD-10-CM | POA: Diagnosis not present

## 2015-12-28 DIAGNOSIS — J4550 Severe persistent asthma, uncomplicated: Secondary | ICD-10-CM | POA: Diagnosis not present

## 2015-12-28 DIAGNOSIS — J309 Allergic rhinitis, unspecified: Secondary | ICD-10-CM | POA: Diagnosis not present

## 2015-12-28 DIAGNOSIS — F339 Major depressive disorder, recurrent, unspecified: Secondary | ICD-10-CM | POA: Diagnosis not present

## 2016-01-30 ENCOUNTER — Emergency Department (HOSPITAL_BASED_OUTPATIENT_CLINIC_OR_DEPARTMENT_OTHER)
Admission: EM | Admit: 2016-01-30 | Discharge: 2016-01-30 | Disposition: A | Discharge status: Home / Self Care | Payer: 59 | Attending: Emergency Medicine | Admitting: Emergency Medicine

## 2016-01-30 ENCOUNTER — Encounter (HOSPITAL_BASED_OUTPATIENT_CLINIC_OR_DEPARTMENT_OTHER): Payer: Self-pay | Admitting: Emergency Medicine

## 2016-01-30 DIAGNOSIS — G4700 Insomnia, unspecified: Secondary | ICD-10-CM | POA: Diagnosis not present

## 2016-01-30 DIAGNOSIS — F329 Major depressive disorder, single episode, unspecified: Secondary | ICD-10-CM | POA: Insufficient documentation

## 2016-01-30 DIAGNOSIS — Z79899 Other long term (current) drug therapy: Secondary | ICD-10-CM | POA: Insufficient documentation

## 2016-01-30 DIAGNOSIS — Z793 Long term (current) use of hormonal contraceptives: Secondary | ICD-10-CM | POA: Diagnosis not present

## 2016-01-30 DIAGNOSIS — K219 Gastro-esophageal reflux disease without esophagitis: Secondary | ICD-10-CM | POA: Diagnosis not present

## 2016-01-30 DIAGNOSIS — Z7951 Long term (current) use of inhaled steroids: Secondary | ICD-10-CM | POA: Insufficient documentation

## 2016-01-30 DIAGNOSIS — Z8701 Personal history of pneumonia (recurrent): Secondary | ICD-10-CM | POA: Diagnosis not present

## 2016-01-30 DIAGNOSIS — J45901 Unspecified asthma with (acute) exacerbation: Secondary | ICD-10-CM | POA: Insufficient documentation

## 2016-01-30 DIAGNOSIS — F419 Anxiety disorder, unspecified: Secondary | ICD-10-CM | POA: Diagnosis not present

## 2016-01-30 DIAGNOSIS — Z880 Allergy status to penicillin: Secondary | ICD-10-CM | POA: Insufficient documentation

## 2016-01-30 DIAGNOSIS — J45909 Unspecified asthma, uncomplicated: Secondary | ICD-10-CM | POA: Diagnosis present

## 2016-01-30 MED ORDER — METHYLPREDNISOLONE SODIUM SUCC 125 MG IJ SOLR
125.0000 mg | Freq: Once | INTRAMUSCULAR | Status: AC
  Administered 2016-01-30: 125 mg via INTRAVENOUS
  Filled 2016-01-30: qty 2

## 2016-01-30 MED ORDER — ALBUTEROL SULFATE (2.5 MG/3ML) 0.083% IN NEBU
2.5000 mg | INHALATION_SOLUTION | Freq: Once | RESPIRATORY_TRACT | Status: AC
  Administered 2016-01-30: 2.5 mg via RESPIRATORY_TRACT
  Filled 2016-01-30: qty 3

## 2016-01-30 MED ORDER — IPRATROPIUM-ALBUTEROL 0.5-2.5 (3) MG/3ML IN SOLN
3.0000 mL | Freq: Once | RESPIRATORY_TRACT | Status: AC
  Administered 2016-01-30: 3 mL via RESPIRATORY_TRACT
  Filled 2016-01-30: qty 3

## 2016-01-30 MED ORDER — PREDNISONE 10 MG (21) PO TBPK: 10.0000 mg | ORAL_TABLET | Freq: Every day | ORAL | Status: DC

## 2016-01-30 NOTE — ED Provider Notes (Signed)
CSN: [id number]     Arrival date & time 01/30/16  2052 History   First MD Initiated Contact with Patient 01/30/16 2102     Chief Complaint  Patient presents with  . Asthma     (Consider location/radiation/quality/duration/timing/severity/associated sxs/prior Treatment) HPI   Pt with hx asthma presents with her typical asthma symptoms.  States she developed wheezing, SOB, cough, mild congestion today.  Used nebulizer treatment without improvement.  States she is already feeling better after treatments in ED and thinks she just needs solu-medrol and will be okay to go home.  States she has recently left her husband and today he came over to the house and was going through things, kicked up a lot of dust.  She thinks the exposure to the dust coupled with the anxiety of her breakup are what caused her asthma to flare up.   Denies recent immobilization, exogenous estrogen, leg swelling, personal or family history of blood clots.  Pt works as a Buyer, retail.   Past Medical History  Diagnosis Date  . PVC (premature ventricular contraction)   . Palpitations   . Pre-syncope   . Tachycardia   . Depression   . Seasonal allergies   . Asthma   . Anxiety   . Depression   . Insomnia   . GERD (gastroesophageal reflux disease)   . Pneumonia     "multiple times when I was a child; once as an adult so far" (12/13/2015)  . Chronic bronchitis (HCC)     "get it pretty much q yr" (12/13/2015)   Past Surgical History  Procedure Laterality Date  . Cesarean section  10/15/2009, 02/08/2006   Family History  Problem Relation Age of Onset  . Heart disease Father   . Rheumatologic disease Mother   . Lung cancer Paternal Grandmother    Social History  Substance Use Topics  . Smoking status: Never Smoker   . Smokeless tobacco: Never Used     Comment: reports "tried it as a teenager" but has not smoked since  . Alcohol Use: 0.0 oz/week    0 Standard drinks or equivalent per week     Comment:  12/13/2015 "stopped 06/2015"   OB History    No data available     Review of Systems  All other systems reviewed and are negative.     Allergies  Penicillins  Home Medications   Prior to Admission medications   Medication Sig Start Date End Date Taking? Authorizing Provider  albuterol (PROAIR HFA) 108 (90 BASE) MCG/ACT inhaler INHALE 2 PUFFS INTO THE LUNGS EVERY 4 HOURS AS NEEDED (FOR SHORTNESS OF BREATH). 08/20/15   Oretha Milch, MD  albuterol (PROVENTIL) (2.5 MG/3ML) 0.083% nebulizer solution Take 3 mLs (2.5 mg total) by nebulization every 4 (four) hours as needed for wheezing or shortness of breath. 12/16/15   Bernadene Person, NP  beclomethasone (QVAR) 80 MCG/ACT inhaler Inhale 1 puff into the lungs 2 (two) times daily. 12/16/15   Bernadene Person, NP  budesonide-formoterol (SYMBICORT) 160-4.5 MCG/ACT inhaler Inhale 2 puffs into the lungs 2 (two) times daily. 12/16/15   Bernadene Person, NP  chlordiazePOXIDE (LIBRIUM) 25 MG capsule Take 25 mg by mouth 3 (three) times daily.    Historical Provider, MD  chlorpheniramine-HYDROcodone (TUSSIONEX) 10-8 MG/5ML SUER Take 5 mLs by mouth every 12 (twelve) hours as needed for cough. 12/16/15   Bernadene Person, NP  esomeprazole (NEXIUM) 20 MG packet Take 20 mg by mouth daily before breakfast. 12/16/15  Bernadene PersonKathryn A Whiteheart, NP  gabapentin (NEURONTIN) 400 MG capsule Take 400 mg by mouth 4 (four) times daily.  09/13/15   Historical Provider, MD  levonorgestrel (MIRENA) 20 MCG/24HR IUD 1 Intra Uterine Device (1 each total) by Intrauterine route once. 07/16/15   Adonis BrookSheila Agustin, NP  mirtazapine (REMERON) 45 MG tablet Take 1 tablet (45 mg total) by mouth at bedtime. 07/16/15   Adonis BrookSheila Agustin, NP  montelukast (SINGULAIR) 10 MG tablet Take 1 tablet (10 mg total) by mouth at bedtime. 12/16/15   Bernadene PersonKathryn A Whiteheart, NP  predniSONE (DELTASONE) 10 MG tablet Take 60mg  prednisone daily for 3 days, then take 40mg  po daily for 3 days, then take 30mg   po daily for 3 days, then take 20mg  po daily for two days, then take 10mg  po daily for 2 days 12/16/15   Bernadene PersonKathryn A Whiteheart, NP  traZODone (DESYREL) 100 MG tablet Take 2 tablets (200 mg total) by mouth at bedtime as needed for sleep. Patient taking differently: Take 200 mg by mouth at bedtime.  07/16/15   Adonis BrookSheila Agustin, NP   BP 117/78 mmHg  Pulse 125  Temp(Src) 97.7 F (36.5 C) (Oral)  Resp 24  Ht 5\' 4"  (1.626 m)  Wt 74.844 kg  BMI 28.31 kg/m2  SpO2 98% Physical Exam  Constitutional: She appears well-developed and well-nourished. No distress.  HENT:  Head: Normocephalic and atraumatic.  Neck: Neck supple.  Pulmonary/Chest: No accessory muscle usage. Tachypnea noted. No respiratory distress. She has no decreased breath sounds. She has no rhonchi. She has no rales.  Coarse breath sounds.  Pt finishing breathing treatment.    Neurological: She is alert.  Skin: She is not diaphoretic.  Nursing note and vitals reviewed.   ED Course  Procedures (including critical care time) Labs Review Labs Reviewed - No data to display  Imaging Review No results found. I have personally reviewed and evaluated these images and lab results as part of my medical decision-making.   EKG Interpretation None        MDM   Final diagnoses:  Asthma exacerbation    Afebrile, nontoxic patient with typical asthma symptoms, better with neb treatments, steroids.  Lungs CTAB following treatments.  Tachycardic likely to multiple treatments tried at home.  No risk factors for PE.   D/C home with predinsone.  Discussed result, findings, treatment, and follow up  with patient.  Pt given return precautions.  Pt verbalizes understanding and agrees with plan.         Trixie Dredgemily Nikos Anglemyer, PA-C 01/30/16 2202  Rolan BuccoMelanie Belfi, MD 01/30/16 2212

## 2016-01-30 NOTE — Discharge Instructions (Signed)
Read the information below.  Use the prescribed medication as directed.  Please discuss all new medications with your pharmacist.  You may return to the Emergency Department at any time for worsening condition or any new symptoms that concern you.    If you develop worsening shortness of breath, uncontrolled wheezing, severe chest pain, or fevers despite using tylenol and/or ibuprofen, return for a recheck.       Asthma, Adult Asthma is a recurring condition in which the airways tighten and narrow. Asthma can make it difficult to breathe. It can cause coughing, wheezing, and shortness of breath. Asthma episodes, also called asthma attacks, range from minor to life-threatening. Asthma cannot be cured, but medicines and lifestyle changes can help control it. CAUSES Asthma is believed to be caused by inherited (genetic) and environmental factors, but its exact cause is unknown. Asthma may be triggered by allergens, lung infections, or irritants in the air. Asthma triggers are different for each person. Common triggers include:   Animal dander.  Dust mites.  Cockroaches.  Pollen from trees or grass.  Mold.  Smoke.  Air pollutants such as dust, household cleaners, hair sprays, aerosol sprays, paint fumes, strong chemicals, or strong odors.  Cold air, weather changes, and winds (which increase molds and pollens in the air).  Strong emotional expressions such as crying or laughing hard.  Stress.  Certain medicines (such as aspirin) or types of drugs (such as beta-blockers).  Sulfites in foods and drinks. Foods and drinks that may contain sulfites include dried fruit, potato chips, and sparkling grape juice.  Infections or inflammatory conditions such as the flu, a cold, or an inflammation of the nasal membranes (rhinitis).  Gastroesophageal reflux disease (GERD).  Exercise or strenuous activity. SYMPTOMS Symptoms may occur immediately after asthma is triggered or many hours later.  Symptoms include:  Wheezing.  Excessive nighttime or early morning coughing.  Frequent or severe coughing with a common cold.  Chest tightness.  Shortness of breath. DIAGNOSIS  The diagnosis of asthma is made by a review of your medical history and a physical exam. Tests may also be performed. These may include:  Lung function studies. These tests show how much air you breathe in and out.  Allergy tests.  Imaging tests such as X-rays. TREATMENT  Asthma cannot be cured, but it can usually be controlled. Treatment involves identifying and avoiding your asthma triggers. It also involves medicines. There are 2 classes of medicine used for asthma treatment:   Controller medicines. These prevent asthma symptoms from occurring. They are usually taken every day.  Reliever or rescue medicines. These quickly relieve asthma symptoms. They are used as needed and provide short-term relief. Your health care provider will help you create an asthma action plan. An asthma action plan is a written plan for managing and treating your asthma attacks. It includes a list of your asthma triggers and how they may be avoided. It also includes information on when medicines should be taken and when their dosage should be changed. An action plan may also involve the use of a device called a peak flow meter. A peak flow meter measures how well the lungs are working. It helps you monitor your condition. HOME CARE INSTRUCTIONS   Take medicines only as directed by your health care provider. Speak with your health care provider if you have questions about how or when to take the medicines.  Use a peak flow meter as directed by your health care provider. Record and keep track  of readings.  Understand and use the action plan to help minimize or stop an asthma attack without needing to seek medical care.  Control your home environment in the following ways to help prevent asthma attacks:  Do not smoke. Avoid being  exposed to secondhand smoke.  Change your heating and air conditioning filter regularly.  Limit your use of fireplaces and wood stoves.  Get rid of pests (such as roaches and mice) and their droppings.  Throw away plants if you see mold on them.  Clean your floors and dust regularly. Use unscented cleaning products.  Try to have someone else vacuum for you regularly. Stay out of rooms while they are being vacuumed and for a short while afterward. If you vacuum, use a dust mask from a hardware store, a double-layered or microfilter vacuum cleaner bag, or a vacuum cleaner with a HEPA filter.  Replace carpet with wood, tile, or vinyl flooring. Carpet can trap dander and dust.  Use allergy-proof pillows, mattress covers, and box spring covers.  Wash bed sheets and blankets every week in hot water and dry them in a dryer.  Use blankets that are made of polyester or cotton.  Clean bathrooms and kitchens with bleach. If possible, have someone repaint the walls in these rooms with mold-resistant paint. Keep out of the rooms that are being cleaned and painted.  Wash hands frequently. SEEK MEDICAL CARE IF:   You have wheezing, shortness of breath, or a cough even if taking medicine to prevent attacks.  The colored mucus you cough up (sputum) is thicker than usual.  Your sputum changes from clear or white to yellow, green, gray, or bloody.  You have any problems that may be related to the medicines you are taking (such as a rash, itching, swelling, or trouble breathing).  You are using a reliever medicine more than 2-3 times per week.  Your peak flow is still at 50-79% of your personal best after following your action plan for 1 hour.  You have a fever. SEEK IMMEDIATE MEDICAL CARE IF:   You seem to be getting worse and are unresponsive to treatment during an asthma attack.  You are short of breath even at rest.  You get short of breath when doing very little physical  activity.  You have difficulty eating, drinking, or talking due to asthma symptoms.  You develop chest pain.  You develop a fast heartbeat.  You have a bluish color to your lips or fingernails.  You are light-headed, dizzy, or faint.  Your peak flow is less than 50% of your personal best.   This information is not intended to replace advice given to you by your health care provider. Make sure you discuss any questions you have with your health care provider.   Document Released: 11/13/2005 Document Revised: 08/04/2015 Document Reviewed: 06/12/2013 Elsevier Interactive Patient Education [employer]

## 2016-01-30 NOTE — ED Notes (Signed)
Pt in c/o asthma and wheezing onset this am and lasting all day. Has done tx at home but is still SOB and wheezing. NAD.

## 2016-02-04 ENCOUNTER — Telehealth: Payer: Self-pay | Admitting: Pulmonary Disease

## 2016-02-04 MED ORDER — GUAIFENESIN-CODEINE 100-10 MG/5ML PO SYRP: 5.0000 mL | ORAL_SOLUTION | Freq: Two times a day (BID) | ORAL | Status: DC | PRN

## 2016-02-04 NOTE — Telephone Encounter (Signed)
D/w pt Coughing, back on pred Wants cough syrup Pl send Rx for cheratussin 5ml po bid prn x 100 ml

## 2016-02-04 NOTE — Telephone Encounter (Signed)
Rx called in for [facility]ussin to [facility]. Patient notified that Rx has been called in. Nothing further needed.

## 2016-02-14 ENCOUNTER — Ambulatory Visit: Payer: Self-pay | Admitting: Pulmonary Disease

## 2016-02-16 ENCOUNTER — Ambulatory Visit (INDEPENDENT_AMBULATORY_CARE_PROVIDER_SITE_OTHER): Payer: 59 | Admitting: Adult Health

## 2016-02-16 ENCOUNTER — Encounter: Payer: Self-pay | Admitting: Adult Health

## 2016-02-16 VITALS — BP 114/82 | HR 99 | Temp 97.7°F | Ht 64.0 in | Wt 162.0 lb

## 2016-02-16 DIAGNOSIS — J4550 Severe persistent asthma, uncomplicated: Secondary | ICD-10-CM | POA: Diagnosis not present

## 2016-02-16 NOTE — Patient Instructions (Signed)
Continue on current regimen   So glad you are feeling better.  Follow up Dr. Vassie LollAlva  In 3 months and As needed

## 2016-02-16 NOTE — Progress Notes (Signed)
Subjective:    Patient ID: Kelsey [name], female    DOB: 1981-06-08, 35 y.o.   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   She had frequent flares in 2013, stable x 1 yr, & again flares since 06/2013.  Asthma triggers include heat humidity allergies and cats  Environment - she has lived in the same house since 2009. She has a dog since 2004. She has perennial allergies and took allergy shots until age 35. Her symptoms are not worse at work and she does not describe any triggers at work.   Significant tests/ events Of note she had a rheumatological evaluation by Dr. Nickola [name], ANA, RA factor was negative sedimentation rate was 3, CPK was 44, thyroid function was normal  RAST 2013 - High titers to cat & dog dander, grass & house dust  PFTs - November 2013 showed FEV1 of 1.87-59% improving to 2.24-70% with bronchodilator.   Sep 2014 severe asthma exacerbation requiring hospitalization. Due to her frequent symptoms -extended pred taper until jan 2015  rast in 2013 showed high IgE. >start Xolair , unable to afford   02/16/2016  Follow up : Asthma  Patient returns for a 2 month  follow-up. She says she is feeling better over last 2 weeks.  Had flare 3 weeks ago with ER visit, rx prednisone but only took briefly .  Just separated from husband, says since being out her house she feels so much better.  On Symbicort and QVAR  and Singulair. Has cut back on QVAR 1 puff Twice daily  . No flare of cough.  She denies any fever, hemoptysis, orthopnea, PND, or increased leg swelling. We discussed steroids side effects.  Says she is starting to exercise.      Past Medical History  Diagnosis Date  . PVC (premature ventricular contraction)   . Palpitations   . Pre-syncope   . Tachycardia   . Depression   . Seasonal allergies   . Asthma   . Anxiety   . Depression   . Insomnia   . GERD (gastroesophageal reflux  disease)   . Pneumonia     "multiple times when I was a child; once as an adult so far" (12/13/2015)  . Chronic bronchitis (HCC)     "get it pretty much q yr" (12/13/2015)     Review of Systems neg for any significant sore throat, dysphagia, itching, sneezing, nasal congestion or excess/ purulent secretions, fever, chills, sweats, unintended wt loss, pleuritic or exertional cp, hempoptysis, orthopnea pnd or change in chronic leg swelling. Also denies presyncope, palpitations, heartburn, abdominal pain, nausea, vomiting, diarrhea or change in bowel or urinary habits, dysuria,hematuria, rash, arthralgias, visual complaints, headache, numbness weakness or ataxia.     Objective:   Physical Exam  Filed Vitals:   02/16/16 1008  BP: 114/82  Pulse: 99  Temp: 97.7 F (36.5 C)  TempSrc: Oral  Height: 5\' 4"  (1.626 m)  Weight: 162 lb (73.483 kg)  SpO2: 98%     Gen. Pleasant, well-nourished, in no distress ENT - no lesions, no post nasal drip Neck: No JVD, no thyromegaly, no carotid bruits Lungs: no use of accessory muscles, no dullness to percussion, clear without rales or rhonchi , no wheezing , good aeration Cardiovascular: Rhythm regular, heart sounds  normal, no murmurs or gallops, no peripheral edema, neg calf pain, neg homans sign  Musculoskeletal: No deformities, no cyanosis or  clubbing   Kelsey Wessner NP-C  [facility]  3/22.17     Assessment & Plan:

## 2016-02-16 NOTE — Assessment & Plan Note (Signed)
Difficult to control Asthma with frequent flare  Appears improved currently  If doing well on return consider further decreasing QVAR if able   Plan  Continue on current regimen   So glad you are feeling better.  Follow up Dr. Vassie LollAlva  In 3 months and As needed

## 2016-02-22 NOTE — Progress Notes (Signed)
Reviewed & agree with plan  

## 2016-04-27 ENCOUNTER — Emergency Department (HOSPITAL_COMMUNITY): Payer: 59

## 2016-04-27 ENCOUNTER — Emergency Department (HOSPITAL_COMMUNITY)
Admission: EM | Admit: 2016-04-27 | Discharge: 2016-04-27 | Disposition: A | Discharge status: Home / Self Care | Payer: 59 | Attending: Emergency Medicine | Admitting: Emergency Medicine

## 2016-04-27 ENCOUNTER — Other Ambulatory Visit: Payer: Self-pay

## 2016-04-27 ENCOUNTER — Encounter (HOSPITAL_COMMUNITY): Payer: Self-pay | Admitting: Emergency Medicine

## 2016-04-27 DIAGNOSIS — R51 Headache: Secondary | ICD-10-CM | POA: Insufficient documentation

## 2016-04-27 DIAGNOSIS — R55 Syncope and collapse: Secondary | ICD-10-CM | POA: Insufficient documentation

## 2016-04-27 DIAGNOSIS — R42 Dizziness and giddiness: Secondary | ICD-10-CM | POA: Diagnosis not present

## 2016-04-27 DIAGNOSIS — Z79899 Other long term (current) drug therapy: Secondary | ICD-10-CM | POA: Insufficient documentation

## 2016-04-27 DIAGNOSIS — J45909 Unspecified asthma, uncomplicated: Secondary | ICD-10-CM | POA: Diagnosis not present

## 2016-04-27 DIAGNOSIS — F329 Major depressive disorder, single episode, unspecified: Secondary | ICD-10-CM | POA: Insufficient documentation

## 2016-04-27 LAB — URINE MICROSCOPIC-ADD ON: RBC / HPF: NONE SEEN RBC/hpf (ref 0–5)

## 2016-04-27 LAB — CBC
HEMATOCRIT: 41.2 % (ref 36.0–46.0)
HEMOGLOBIN: 13.3 g/dL (ref 12.0–15.0)
MCH: 30.4 pg (ref 26.0–34.0)
MCHC: 32.3 g/dL (ref 30.0–36.0)
MCV: 94.3 fL (ref 78.0–100.0)
Platelets: 283 10*3/uL (ref 150–400)
RBC: 4.37 MIL/uL (ref 3.87–5.11)
RDW: 12.9 % (ref 11.5–15.5)
WBC: 6.2 10*3/uL (ref 4.0–10.5)

## 2016-04-27 LAB — BASIC METABOLIC PANEL
Anion gap: 7 (ref 5–15)
BUN: 17 mg/dL (ref 6–20)
CALCIUM: 9 mg/dL (ref 8.9–10.3)
CO2: 24 mmol/L (ref 22–32)
Chloride: 108 mmol/L (ref 101–111)
Creatinine, Ser: 0.9 mg/dL (ref 0.44–1.00)
GFR calc non Af Amer: >60 mL/min (ref >60)
Glucose, Bld: 113 mg/dL — ABNORMAL HIGH (ref 65–99)
Potassium: 3.9 mmol/L (ref 3.5–5.1)
Sodium: 139 mmol/L (ref 135–145)

## 2016-04-27 LAB — URINALYSIS, ROUTINE W REFLEX MICROSCOPIC
Bilirubin Urine: NEGATIVE
Glucose, UA: NEGATIVE mg/dL
HGB URINE DIPSTICK: NEGATIVE
Ketones, ur: NEGATIVE mg/dL
NITRITE: NEGATIVE
Protein, ur: NEGATIVE mg/dL
SPECIFIC GRAVITY, URINE: 1.027 (ref 1.005–1.030)
pH: 6 (ref 5.0–8.0)

## 2016-04-27 LAB — CBG MONITORING, ED: GLUCOSE-CAPILLARY: 101 mg/dL — AB (ref 65–99)

## 2016-04-27 MED ORDER — ACETAMINOPHEN 500 MG PO TABS
1000.0000 mg | ORAL_TABLET | Freq: Once | ORAL | Status: AC
  Administered 2016-04-27: 1000 mg via ORAL
  Filled 2016-04-27: qty 2

## 2016-04-27 MED ORDER — SODIUM CHLORIDE 0.9 % IV BOLUS (SEPSIS): 1000.0000 mL | Freq: Once | INTRAVENOUS | Status: DC

## 2016-04-27 NOTE — Discharge Instructions (Signed)
Continue taking your home medications as prescribed. I recommend drinking at least six 8 ounce glasses of water daily to remain hydrated at home.  Follow up with your primary care provider in the next 4-5 days for follow up if your symptoms have not resolved.  Please return to the Emergency Department if symptoms worsen or new onset of fever, worsening/new headache, visual changes, neck stiffness, dizziness, difficulty breathing, chest pain, vomiting, abdominal pain, numbness, tingling, weakness, syncope, seizure.

## 2016-04-27 NOTE — ED Provider Notes (Signed)
CSN: [id number]     Arrival date & time 04/27/16  1459 History   First MD Initiated Contact with Patient 04/27/16 1849     Chief Complaint  Patient presents with  . Near Syncope     (Consider location/radiation/quality/duration/timing/severity/associated sxs/prior Treatment) HPI   Patient is a 35 year old female past medical history of asthma, anxiety and depression who presents the ED with complaint of near syncopal episode, onset 3 PM. 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 She reports having sudden onset of lightheadedness, seeing black spots, nausea and one episode of vomiting. She notes a coworker helped her sit down and reports her sxs improved after sitting. She also reports having a diffuse intermittent aching headache for the past two days but notes became constant after her near syncopal episode today. She reports while she was getting out of the shower 2 days ago she slipped resulting in the back of her head hitting the corner of the bath tub. Pt denies LOC. She endorses having an intermittent sharp pain to her right posterior scalp which she notes has become constant since her near syncopal episode today. Pt denies fever, neck stiffness, photophobia, abdominal pain, urinary symptoms, numbness, tingling, weakness, seizures, syncope.  Pt report she has been taking ibuprofen at home with no relief. Pt reports since arrival to the ED her lightheadedness and nausea have resolved but she continues to have a HA.  Past Medical History  Diagnosis Date  . PVC (premature ventricular contraction)   . Palpitations   . Pre-syncope   . Tachycardia   . Depression   . Seasonal allergies   . Asthma   . Anxiety   . Depression   . Insomnia   . GERD (gastroesophageal reflux disease)   . Pneumonia     "multiple times when I was a child; once as an adult so far" (12/13/2015)  . Chronic bronchitis (HCC)     "get  it pretty much q yr" (12/13/2015)   Past Surgical History  Procedure Laterality Date  . Cesarean section  10/15/2009, 02/08/2006   Family History  Problem Relation Age of Onset  . Heart disease Father   . Rheumatologic disease Mother   . Lung cancer Paternal Grandmother    Social History  Substance Use Topics  . Smoking status: Never Smoker   . Smokeless tobacco: Never Used     Comment: reports "tried it as a teenager" but has not smoked since  . Alcohol Use: 0.0 oz/week    0 Standard drinks or equivalent per week     Comment: 12/13/2015 "stopped 06/2015"   OB History    No data available     Review of Systems  Eyes: Positive for visual disturbance.  Gastrointestinal: Positive for nausea and vomiting.  Neurological: Positive for light-headedness and headaches.  All other systems reviewed and are negative.     Allergies  Penicillins  Home Medications   Prior to Admission medications   Medication Sig Start Date End Date Taking? Authorizing Provider  albuterol (PROAIR HFA) 108 (90 BASE) MCG/ACT inhaler INHALE 2 PUFFS INTO THE LUNGS EVERY 4 HOURS AS NEEDED (FOR SHORTNESS OF BREATH). 08/20/15   Oretha Milchakesh Alva V, MD  albuterol (PROVENTIL) (2.5 MG/3ML) 0.083% nebulizer solution Take 3 mLs (2.5 mg total) by nebulization every 4 (four) hours as needed for wheezing or shortness of breath. 12/16/15   Bernadene PersonKathryn A Whiteheart, NP  beclomethasone (QVAR) 80 MCG/ACT  inhaler Inhale 1 puff into the lungs 2 (two) times daily. 12/16/15   Bernadene Person, NP  budesonide-formoterol (SYMBICORT) 160-4.5 MCG/ACT inhaler Inhale 2 puffs into the lungs 2 (two) times daily. 12/16/15   Bernadene Person, NP  chlordiazePOXIDE (LIBRIUM) 25 MG capsule Take 25 mg by mouth 3 (three) times daily.    Historical Provider, MD  chlorpheniramine-HYDROcodone (TUSSIONEX) 10-8 MG/5ML SUER Take 5 mLs by mouth every 12 (twelve) hours as needed for cough. Patient not taking: Reported on 02/16/2016 12/16/15   Bernadene Person, NP  esomeprazole (NEXIUM) 20 MG packet Take 20 mg by mouth daily before breakfast. 12/16/15   Bernadene Person, NP  gabapentin (NEURONTIN) 400 MG capsule Take 400 mg by mouth 4 (four) times daily.  09/13/15   Historical Provider, MD  guaiFENesin-codeine (CHERATUSSIN AC) 100-10 MG/5ML syrup Take 5 mLs by mouth 2 (two) times daily as needed for cough. Patient not taking: Reported on 02/16/2016 02/04/16   Oretha Milch, MD  levonorgestrel (MIRENA) 20 MCG/24HR IUD 1 Intra Uterine Device (1 each total) by Intrauterine route once. 07/16/15   Adonis Brook, NP  mirtazapine (REMERON) 45 MG tablet Take 1 tablet (45 mg total) by mouth at bedtime. 07/16/15   Adonis Brook, NP  montelukast (SINGULAIR) 10 MG tablet Take 1 tablet (10 mg total) by mouth at bedtime. 12/16/15   Bernadene Person, NP  traZODone (DESYREL) 100 MG tablet Take 2 tablets (200 mg total) by mouth at bedtime as needed for sleep. Patient taking differently: Take 200 mg by mouth at bedtime.  07/16/15   Adonis Brook, NP   BP 137/88 mmHg  Pulse 75  Temp(Src) 98.1 F (36.7 C)  Resp 14  SpO2 100% Physical Exam  Constitutional: She is oriented to person, place, and time. She appears well-developed and well-nourished. No distress.  HENT:  Head: Normocephalic and atraumatic. Head is without raccoon's eyes, without Battle's sign, without abrasion, without contusion and without laceration.    Right Ear: Tympanic membrane normal. No hemotympanum.  Left Ear: Tympanic membrane normal. No hemotympanum.  Nose: Nose normal. No sinus tenderness, nasal deformity, septal deviation or nasal septal hematoma. No epistaxis. Right sinus exhibits no maxillary sinus tenderness and no frontal sinus tenderness. Left sinus exhibits no maxillary sinus tenderness and no frontal sinus tenderness.  Mouth/Throat: Uvula is midline, oropharynx is clear and moist and mucous membranes are normal. No oropharyngeal exudate, posterior oropharyngeal edema,  posterior oropharyngeal erythema or tonsillar abscesses.  Eyes: Conjunctivae and EOM are normal. Pupils are equal, round, and reactive to light. Right eye exhibits no discharge. Left eye exhibits no discharge. No scleral icterus.  Neck: Normal range of motion. Neck supple.  Cardiovascular: Normal rate, regular rhythm, normal heart sounds and intact distal pulses.   Pulmonary/Chest: Effort normal and breath sounds normal. No respiratory distress. She has no wheezes. She has no rales. She exhibits no tenderness.  Abdominal: Soft. Bowel sounds are normal. She exhibits no distension and no mass. There is no tenderness. There is no rebound and no guarding.  Musculoskeletal: Normal range of motion. She exhibits no edema.  Neurological: She is alert and oriented to person, place, and time. She has normal strength. No cranial nerve deficit or sensory deficit. She displays a negative Romberg sign. Coordination normal.  Skin: Skin is warm and dry. She is not diaphoretic.  Nursing note and vitals reviewed.   ED Course  Procedures (including critical care time) Labs Review Labs Reviewed  BASIC METABOLIC PANEL - Abnormal;  Notable for the following:    Glucose, Bld 113 (*)    All other components within normal limits  URINALYSIS, ROUTINE W REFLEX MICROSCOPIC (NOT AT [facility]) - Abnormal; Notable for the following:    APPearance CLOUDY (*)    Leukocytes, UA MODERATE (*)    All other components within normal limits  URINE MICROSCOPIC-ADD ON - Abnormal; Notable for the following:    Squamous Epithelial / LPF 0-5 (*)    Bacteria, UA MANY (*)    All other components within normal limits  CBG MONITORING, ED - Abnormal; Notable for the following:    Glucose-Capillary 101 (*)    All other components within normal limits  URINE CULTURE  CBC    Imaging Review Ct Head Wo Contrast  04/27/2016  CLINICAL DATA:  Pt reports slip and fall while getting out of bathroom 2 days ago. Pt hit right side of posterior head  but felt ok then but now experiencing worsening HA,, lightheadedness EXAM: CT HEAD WITHOUT CONTRAST TECHNIQUE: Contiguous axial images were obtained from the base of the skull through the vertex without intravenous contrast. COMPARISON:  None. FINDINGS: The ventricles are normal in size and configuration. There are no parenchymal masses or mass effect, no evidence of an infarct, no extra-axial masses or abnormal fluid collections and no intracranial hemorrhage. No skull fracture. The visualized sinuses and mastoid air cells are clear. IMPRESSION: Normal unenhanced CT scan of the brain. Electronically Signed   By: Amie Portland M.D.   On: 04/27/2016 20:34   I have personally reviewed and evaluated these images and lab results as part of my medical decision-making.   EKG Interpretation   Date/Time:  Thursday April 27 2016 15:10:08 EDT Ventricular Rate:  85 PR Interval:  136 QRS Duration: 68 QT Interval:  384 QTC Calculation: 456 R Axis:   46 Text Interpretation:  Normal sinus rhythm with sinus arrhythmia Low  voltage QRS Septal infarct , age undetermined Abnormal ECG No significant  change since last tracing Confirmed by Karma Ganja  MD, MARTHA [phone]) on  04/27/2016 6:45:21 PM      MDM   Final diagnoses:  Near syncope    Patient presents with near syncopal episode that occurred earlier this afternoon while at work. She also reports slipping and hitting her head while getting out of the shower a few days ago, denies LOC. VSS. Exam revealed mild tenderness to right occipital region of scalp, no signs of head injury or trauma, no neuro deficits. Remaining exam unremarkable. Patient given Tylenol for headache. Mild orthostasis noted. Labs unremarkable. Urine revealed moderate leukocytes, many bacteria, 0-5 WBC. Patient denies urinary symptoms, I do not feel that these results warrant initiation of antibiotics however will send urine culture. CT head negative. I suspect patient's near syncopal episode  is likely due to mild dehydration. Discussed results and plan for discharge with patient. Advised patient to continue drinking fluids at home to remain hydrated. Advised patient to follow up with PCP. Discussed return precautions with patient.    Satira Sark [place], [place] 04/27/16 2056  Jerelyn Scott, MD 04/27/16 2101

## 2016-04-27 NOTE — ED Notes (Signed)
Pt states she was walking down the hallway at work and had a sudden onset of dizziness and states she felt like she was going to pass out but did not loss consciousness. Pt states two days ago while getting out of the shower she fell getting out of the shower and hit there back of her head. Pt denies any LOC with that fall. Pt denies any dizziness at this time just has a headache and feels nauseous.

## 2016-04-29 LAB — URINE CULTURE

## 2016-05-01 ENCOUNTER — Ambulatory Visit: Payer: Self-pay | Admitting: Pulmonary Disease

## 2016-05-02 ENCOUNTER — Encounter: Payer: Self-pay | Admitting: Adult Health

## 2016-05-02 ENCOUNTER — Ambulatory Visit (INDEPENDENT_AMBULATORY_CARE_PROVIDER_SITE_OTHER): Payer: 59 | Admitting: Adult Health

## 2016-05-02 VITALS — BP 130/68 | HR 76 | Temp 97.6°F | Ht 64.0 in | Wt 163.8 lb

## 2016-05-02 DIAGNOSIS — J309 Allergic rhinitis, unspecified: Secondary | ICD-10-CM | POA: Diagnosis not present

## 2016-05-02 DIAGNOSIS — J4550 Severe persistent asthma, uncomplicated: Secondary | ICD-10-CM | POA: Diagnosis not present

## 2016-05-02 NOTE — Assessment & Plan Note (Signed)
Well controlled on rx -on dual ICS  No oral steroids x 3-4 months.    Plan  Continue on current regimen   So glad you are feeling better.  Follow up Dr. Vassie LollAlva  In 3 -4 months and As needed

## 2016-05-02 NOTE — Assessment & Plan Note (Signed)
Doing well w/ no flare Cont rx

## 2016-05-02 NOTE — Patient Instructions (Signed)
Continue on current regimen   So glad you are feeling better.  Follow up Dr. Vassie LollAlva  In 3 -4 months and As needed

## 2016-05-02 NOTE — Progress Notes (Signed)
Subjective:    Patient ID: Kelsey [name], female    DOB: 06-06-1981, 35 y.o.   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   She had frequent flares in 2013, stable x 1 yr, & again flares since 06/2013.  Asthma triggers include heat humidity allergies and cats  Environment - she has lived in the same house since 2009. She has a dog since 2004. She has perennial allergies and took allergy shots until age 17. Her symptoms are not worse at work and she does not describe any triggers at work.   Significant tests/ events Of note she had a rheumatological evaluation by Dr. Nickola Major, ANA, RA factor was negative sedimentation rate was 3, CPK was 44, thyroid function was normal  RAST 2013 - High titers to cat & dog dander, grass & house dust  PFTs - November 2013 showed FEV1 of 1.87-59% improving to 2.24-70% with bronchodilator.   Sep 2014 severe asthma exacerbation requiring hospitalization. Due to her frequent symptoms -extended pred taper until jan 2015  rast in 2013 showed high IgE. >start Xolair , unable to afford   05/02/2016  Follow up : Asthma  Patient returns for a 3 month  follow-up. She says she is feeling better , best in while. No asthma flare since last ov.  On Symbicort and QVAR  and Singulair. No increased SABA use.  She denies any fever, hemoptysis, orthopnea, PND, or increased leg swelling. We discussed steroids side effects. Advised on regular bone , teeth checks .  Says she is starting to exercise. And going to gym. Has lost 20lbs.  Mood is much better. Steroids make her depression worse.       Past Medical History  Diagnosis Date  . PVC (premature ventricular contraction)   . Palpitations   . Pre-syncope   . Tachycardia   . Depression   . Seasonal allergies   . Asthma   . Anxiety   . Depression   . Insomnia   . GERD (gastroesophageal reflux disease)   . Pneumonia     "multiple  times when I was a child; once as an adult so far" (12/13/2015)  . Chronic bronchitis (HCC)     "get it pretty much q yr" (12/13/2015)     Review of Systems neg for any significant sore throat, dysphagia, itching, sneezing, nasal congestion or excess/ purulent secretions, fever, chills, sweats, unintended wt loss, pleuritic or exertional cp, hempoptysis, orthopnea pnd or change in chronic leg swelling. Also denies presyncope, palpitations, heartburn, abdominal pain, nausea, vomiting, diarrhea or change in bowel or urinary habits, dysuria,hematuria, rash, arthralgias, visual complaints, headache, numbness weakness or ataxia.     Objective:   Physical Exam  Filed Vitals:   05/02/16 0950  BP: 130/68  Pulse: 76  Temp: 97.6 F (36.4 C)  TempSrc: Oral  Height:  (1.626 m)  Weight: 163 lb 12.8 oz (74.299 kg)  SpO2: 97%     Gen. Pleasant, well-nourished, in no distress ENT - no lesions, no post nasal drip Neck: No JVD, no thyromegaly, no carotid bruits Lungs: no use of accessory muscles, no dullness to percussion, clear without rales or rhonchi , no wheezing , good aeration Cardiovascular: Rhythm regular, heart sounds  normal, no murmurs or gallops, no peripheral edema, neg calf pain, neg homans sign  Musculoskeletal: No deformities, no cyanosis or clubbing   Ashlon Lottman NP-C  [facility]  05/02/2016     Assessment & Plan:

## 2016-05-05 NOTE — Progress Notes (Signed)
Reviewed & agree with plan  

## 2016-05-09 ENCOUNTER — Ambulatory Visit: Payer: Self-pay | Admitting: Pulmonary Disease

## 2016-05-16 ENCOUNTER — Telehealth: Payer: Self-pay | Admitting: Pulmonary Disease

## 2016-05-16 NOTE — Telephone Encounter (Signed)
FMLA paperwork received via fax from [employer] for Kelsey [name].  [facility] by interoffice mail and marked as time sensitive.

## 2016-05-24 ENCOUNTER — Telehealth: Payer: Self-pay | Admitting: Pulmonary Disease

## 2016-05-24 NOTE — Telephone Encounter (Signed)
done

## 2016-05-26 ENCOUNTER — Telehealth: Payer: Self-pay | Admitting: Pulmonary Disease

## 2016-05-26 MED ORDER — ALBUTEROL SULFATE HFA 108 (90 BASE) MCG/ACT IN AERS: INHALATION_SPRAY | RESPIRATORY_TRACT | Status: DC

## 2016-05-26 NOTE — Telephone Encounter (Signed)
Called spoke with pt. She needs refill in proair. I have sent this in. Nothing further needed

## 2016-06-05 ENCOUNTER — Telehealth: Payer: Self-pay | Admitting: Pulmonary Disease

## 2016-06-05 NOTE — Telephone Encounter (Signed)
-----   Message from Lum KeasLatonya M Andrews sent at 06/01/2016  9:37 AM EDT ----- Regarding: FMLA-Dr. Vassie LollAlva Sending forms interofce for Dr. Vassie LollAlva.  Thank you, Adair LaundryLaTonya

## 2016-06-06 NOTE — Telephone Encounter (Signed)
Kelsey [name] agreed to complete forms, gave forms to [name]. Will forward message to [name] for follow up.

## 2016-06-06 NOTE — Telephone Encounter (Signed)
G[place] disability forms to [place] to have Dr. Vassie LollAlva complete. He is currently out of the office - 06/06/16-prm

## 2016-06-08 NOTE — Telephone Encounter (Signed)
TP is aware, awaiting completion

## 2016-06-09 NOTE — Telephone Encounter (Signed)
Forms were given back to Michelle for RA to complete 

## 2016-06-12 NOTE — Telephone Encounter (Signed)
Dr. Alva advised me that he will not be back in the office today, will stop by the office "sometime this week". Called and advised LaTonya at [employer] ([phone]) of status of forms, she said that she needs to have these back before Friday.   Will send message to Dr. Alva as FYI - folder has been placed on Dr. Alva's desk for him to complete and return back to me 

## 2016-06-13 NOTE — Telephone Encounter (Signed)
done

## 2016-06-14 NOTE — Telephone Encounter (Signed)
Rec'd disability forms from Michelle. Sent interoffice mail to [employer] -prm  °

## 2016-06-30 ENCOUNTER — Emergency Department (HOSPITAL_COMMUNITY): Payer: 59

## 2016-06-30 ENCOUNTER — Encounter (HOSPITAL_COMMUNITY): Payer: Self-pay | Admitting: Emergency Medicine

## 2016-06-30 ENCOUNTER — Emergency Department (HOSPITAL_COMMUNITY)
Admission: EM | Admit: 2016-06-30 | Discharge: 2016-07-01 | Disposition: A | Discharge status: Home / Self Care | Payer: 59 | Attending: Emergency Medicine | Admitting: Emergency Medicine

## 2016-06-30 DIAGNOSIS — R079 Chest pain, unspecified: Secondary | ICD-10-CM | POA: Insufficient documentation

## 2016-06-30 DIAGNOSIS — S61213A Laceration without foreign body of left middle finger without damage to nail, initial encounter: Secondary | ICD-10-CM | POA: Insufficient documentation

## 2016-06-30 DIAGNOSIS — Y939 Activity, unspecified: Secondary | ICD-10-CM | POA: Diagnosis not present

## 2016-06-30 DIAGNOSIS — T148 Other injury of unspecified body region: Secondary | ICD-10-CM | POA: Diagnosis not present

## 2016-06-30 DIAGNOSIS — F17210 Nicotine dependence, cigarettes, uncomplicated: Secondary | ICD-10-CM | POA: Diagnosis not present

## 2016-06-30 DIAGNOSIS — J4521 Mild intermittent asthma with (acute) exacerbation: Secondary | ICD-10-CM

## 2016-06-30 DIAGNOSIS — T07XXXA Unspecified multiple injuries, initial encounter: Secondary | ICD-10-CM

## 2016-06-30 DIAGNOSIS — F329 Major depressive disorder, single episode, unspecified: Secondary | ICD-10-CM | POA: Diagnosis not present

## 2016-06-30 DIAGNOSIS — Y92410 Unspecified street and highway as the place of occurrence of the external cause: Secondary | ICD-10-CM | POA: Diagnosis not present

## 2016-06-30 DIAGNOSIS — Y999 Unspecified external cause status: Secondary | ICD-10-CM | POA: Diagnosis not present

## 2016-06-30 DIAGNOSIS — S301XXA Contusion of abdominal wall, initial encounter: Secondary | ICD-10-CM | POA: Diagnosis not present

## 2016-06-30 DIAGNOSIS — R109 Unspecified abdominal pain: Secondary | ICD-10-CM | POA: Diagnosis not present

## 2016-06-30 DIAGNOSIS — J45901 Unspecified asthma with (acute) exacerbation: Secondary | ICD-10-CM | POA: Diagnosis not present

## 2016-06-30 DIAGNOSIS — S3991XA Unspecified injury of abdomen, initial encounter: Secondary | ICD-10-CM | POA: Diagnosis not present

## 2016-06-30 DIAGNOSIS — S61219A Laceration without foreign body of unspecified finger without damage to nail, initial encounter: Secondary | ICD-10-CM

## 2016-06-30 DIAGNOSIS — R52 Pain, unspecified: Secondary | ICD-10-CM | POA: Diagnosis not present

## 2016-06-30 DIAGNOSIS — S199XXA Unspecified injury of neck, initial encounter: Secondary | ICD-10-CM | POA: Diagnosis not present

## 2016-06-30 DIAGNOSIS — J45909 Unspecified asthma, uncomplicated: Secondary | ICD-10-CM | POA: Diagnosis not present

## 2016-06-30 DIAGNOSIS — S2002XA Contusion of left breast, initial encounter: Secondary | ICD-10-CM | POA: Diagnosis not present

## 2016-06-30 DIAGNOSIS — S20212A Contusion of left front wall of thorax, initial encounter: Secondary | ICD-10-CM | POA: Diagnosis not present

## 2016-06-30 DIAGNOSIS — S299XXA Unspecified injury of thorax, initial encounter: Secondary | ICD-10-CM | POA: Diagnosis not present

## 2016-06-30 LAB — URINE MICROSCOPIC-ADD ON: RBC / HPF: NONE SEEN RBC/hpf (ref 0–5)

## 2016-06-30 LAB — URINALYSIS, ROUTINE W REFLEX MICROSCOPIC
BILIRUBIN URINE: NEGATIVE
Glucose, UA: NEGATIVE mg/dL
HGB URINE DIPSTICK: NEGATIVE
Ketones, ur: 15 mg/dL — AB
NITRITE: NEGATIVE
PH: 7.5 (ref 5.0–8.0)
Protein, ur: 30 mg/dL — AB
SPECIFIC GRAVITY, URINE: 1.035 — AB (ref 1.005–1.030)

## 2016-06-30 LAB — CBC WITH DIFFERENTIAL/PLATELET
Basophils Absolute: 0 10*3/uL (ref 0.0–0.1)
Basophils Relative: 0 %
Eosinophils Absolute: 0 10*3/uL (ref 0.0–0.7)
Eosinophils Relative: 0 %
HEMATOCRIT: 43.6 % (ref 36.0–46.0)
HEMOGLOBIN: 14.5 g/dL (ref 12.0–15.0)
LYMPHS ABS: 1.3 10*3/uL (ref 0.7–4.0)
Lymphocytes Relative: 9 %
MCH: 31.7 pg (ref 26.0–34.0)
MCHC: 33.3 g/dL (ref 30.0–36.0)
MCV: 95.4 fL (ref 78.0–100.0)
MONOS PCT: 7 %
Monocytes Absolute: 1 10*3/uL (ref 0.1–1.0)
Neutro Abs: 11.5 10*3/uL — ABNORMAL HIGH (ref 1.7–7.7)
Neutrophils Relative %: 84 %
Platelets: 307 10*3/uL (ref 150–400)
RBC: 4.57 MIL/uL (ref 3.87–5.11)
RDW: 12.8 % (ref 11.5–15.5)
WBC: 13.8 10*3/uL — ABNORMAL HIGH (ref 4.0–10.5)

## 2016-06-30 LAB — I-STAT CHEM 8, ED
BUN: 15 mg/dL (ref 6–20)
CALCIUM ION: 1.11 mmol/L — AB (ref 1.13–1.30)
CREATININE: 0.6 mg/dL (ref 0.44–1.00)
Chloride: 105 mmol/L (ref 101–111)
GLUCOSE: 107 mg/dL — AB (ref 65–99)
HCT: 44 % (ref 36.0–46.0)
Hemoglobin: 15 g/dL (ref 12.0–15.0)
Potassium: 3.9 mmol/L (ref 3.5–5.1)
Sodium: 140 mmol/L (ref 135–145)
TCO2: 24 mmol/L (ref 0–100)

## 2016-06-30 LAB — POC URINE PREG, ED: Preg Test, Ur: NEGATIVE

## 2016-06-30 MED ORDER — OXYCODONE-ACETAMINOPHEN 5-325 MG PO TABS
2.0000 | ORAL_TABLET | Freq: Once | ORAL | Status: AC
  Administered 2016-06-30: 2 via ORAL
  Filled 2016-06-30: qty 2

## 2016-06-30 MED ORDER — SODIUM CHLORIDE 0.9 % IV BOLUS (SEPSIS)
1000.0000 mL | Freq: Once | INTRAVENOUS | Status: AC
  Administered 2016-06-30: 1000 mL via INTRAVENOUS

## 2016-06-30 MED ORDER — MORPHINE SULFATE (PF) 4 MG/ML IV SOLN
6.0000 mg | Freq: Once | INTRAVENOUS | Status: AC
  Administered 2016-06-30: 6 mg via INTRAVENOUS
  Filled 2016-06-30: qty 2

## 2016-06-30 MED ORDER — ONDANSETRON HCL 4 MG/2ML IJ SOLN
4.0000 mg | Freq: Once | INTRAMUSCULAR | Status: AC
  Administered 2016-06-30: 4 mg via INTRAVENOUS
  Filled 2016-06-30: qty 2

## 2016-06-30 MED ORDER — ALBUTEROL SULFATE (2.5 MG/3ML) 0.083% IN NEBU
5.0000 mg | INHALATION_SOLUTION | Freq: Once | RESPIRATORY_TRACT | Status: AC
  Administered 2016-06-30: 5 mg via RESPIRATORY_TRACT
  Filled 2016-06-30: qty 6

## 2016-06-30 MED ORDER — LIDOCAINE HCL 2 % IJ SOLN
10.0000 mL | Freq: Once | INTRAMUSCULAR | Status: AC
  Administered 2016-06-30: 200 mg via INTRADERMAL
  Filled 2016-06-30: qty 20

## 2016-06-30 MED ORDER — IPRATROPIUM BROMIDE 0.02 % IN SOLN
0.5000 mg | Freq: Once | RESPIRATORY_TRACT | Status: AC
  Administered 2016-06-30: 0.5 mg via RESPIRATORY_TRACT
  Filled 2016-06-30: qty 2.5

## 2016-06-30 MED ORDER — IOPAMIDOL (ISOVUE-300) INJECTION 61%
INTRAVENOUS | Status: AC
Start: 2016-06-30 — End: 2016-06-30
  Administered 2016-06-30: 100 mL
  Filled 2016-06-30: qty 100

## 2016-06-30 NOTE — ED Notes (Signed)
Pt in radiology.  Will start IV and administer pain meds once pt returns.
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
## 2016-06-30 NOTE — ED Notes (Signed)
PA at bedside.
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
## 2016-06-30 NOTE — ED Notes (Signed)
Pt refused c-collar at scene. C-collar applied in triage. Pt did indicate her neck was tender and sore.

## 2016-07-01 MED ORDER — CYCLOBENZAPRINE HCL 10 MG PO TABS: 10.0000 mg | ORAL_TABLET | Freq: Two times a day (BID) | ORAL | 0 refills | Status: DC | PRN

## 2016-07-01 MED ORDER — HYDROCODONE-ACETAMINOPHEN 5-325 MG PO TABS: 2.0000 | ORAL_TABLET | Freq: Four times a day (QID) | ORAL | 0 refills | Status: DC | PRN

## 2016-07-01 MED ORDER — IOPAMIDOL (ISOVUE-300) INJECTION 61%
INTRAVENOUS | Status: AC
  Filled 2016-07-01: qty 100

## 2016-07-01 NOTE — ED Notes (Signed)
Pt departed in NAD, refused use of wheelchair.  
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
## 2016-07-07 ENCOUNTER — Ambulatory Visit (INDEPENDENT_AMBULATORY_CARE_PROVIDER_SITE_OTHER): Payer: 59 | Admitting: Physician Assistant

## 2016-07-07 ENCOUNTER — Encounter: Payer: Self-pay | Admitting: Physician Assistant

## 2016-07-07 ENCOUNTER — Ambulatory Visit (INDEPENDENT_AMBULATORY_CARE_PROVIDER_SITE_OTHER): Payer: 59

## 2016-07-07 VITALS — BP 118/84 | HR 91 | Temp 97.7°F | Resp 17 | Ht 60.0 in | Wt 167.4 lb

## 2016-07-07 DIAGNOSIS — R0781 Pleurodynia: Secondary | ICD-10-CM

## 2016-07-07 DIAGNOSIS — Z4802 Encounter for removal of sutures: Secondary | ICD-10-CM

## 2016-07-07 DIAGNOSIS — J9811 Atelectasis: Secondary | ICD-10-CM | POA: Diagnosis not present

## 2016-07-07 LAB — POCT CBC
GRANULOCYTE PERCENT: 70.8 % (ref 37–80)
HEMATOCRIT: 41 % (ref 37.7–47.9)
HEMOGLOBIN: 14 g/dL (ref 12.2–16.2)
Lymph, poc: 1.2 (ref 0.6–3.4)
MCH: 31.8 pg — AB (ref 27–31.2)
MCHC: 34.2 g/dL (ref 31.8–35.4)
MCV: 93 fL (ref 80–97)
MID (cbc): 0.4 (ref 0–0.9)
MPV: 7.4 fL (ref 0–99.8)
POC GRANULOCYTE: 3.9 (ref 2–6.9)
POC LYMPH PERCENT: 21.6 %L (ref 10–50)
POC MID %: 7.6 % (ref 0–12)
Platelet Count, POC: 281 10*3/uL (ref 142–424)
RBC: 4.41 M/uL (ref 4.04–5.48)
RDW, POC: 12.9 %
WBC: 5.5 10*3/uL (ref 4.6–10.2)

## 2016-07-07 MED ORDER — HYDROCODONE-ACETAMINOPHEN 5-325 MG PO TABS: 2.0000 | ORAL_TABLET | Freq: Four times a day (QID) | ORAL | 0 refills | Status: DC | PRN

## 2016-07-07 MED ORDER — SPIROMETER KIT: 1.0000 | PACK | Freq: Four times a day (QID) | 0 refills | Status: AC
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
## 2016-07-14 ENCOUNTER — Telehealth: Payer: Self-pay

## 2016-07-14 NOTE — Telephone Encounter (Signed)
Patient stated she need a note for work stating her diagnosis is causing her to have shortness of breath and its causing her to be out of work. [phone].

## 2016-07-17 NOTE — Telephone Encounter (Signed)
We can write her a note for the days she has missed.  If she continues to have the symptoms at this time, however she should return to the clinic.  This was to follow up in 1 week if symptoms did not improve.

## 2016-07-17 NOTE — Telephone Encounter (Signed)
Left message for pt to call back  °

## 2016-07-18 DIAGNOSIS — M940 Chondrocostal junction syndrome [Tietze]: Secondary | ICD-10-CM | POA: Diagnosis not present

## 2016-07-18 DIAGNOSIS — S61212A Laceration without foreign body of right middle finger without damage to nail, initial encounter: Secondary | ICD-10-CM | POA: Diagnosis not present

## 2016-07-19 ENCOUNTER — Ambulatory Visit: Payer: Self-pay | Admitting: Adult Health
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
## 2016-07-20 NOTE — Telephone Encounter (Signed)
Kelsey CornfieldStephanie come talk to me about this, I meant to before you left the other day. I need help with the letter.

## 2016-07-21 ENCOUNTER — Ambulatory Visit (INDEPENDENT_AMBULATORY_CARE_PROVIDER_SITE_OTHER): Payer: 59 | Admitting: Pulmonary Disease

## 2016-07-21 ENCOUNTER — Encounter: Payer: Self-pay | Admitting: Pulmonary Disease

## 2016-07-21 DIAGNOSIS — J4530 Mild persistent asthma, uncomplicated: Secondary | ICD-10-CM | POA: Diagnosis not present

## 2016-07-21 DIAGNOSIS — F411 Generalized anxiety disorder: Secondary | ICD-10-CM | POA: Diagnosis not present

## 2016-07-21 MED ORDER — ALBUTEROL SULFATE HFA 108 (90 BASE) MCG/ACT IN AERS: INHALATION_SPRAY | RESPIRATORY_TRACT | 5 refills | Status: DC

## 2016-07-21 MED ORDER — BUDESONIDE-FORMOTEROL FUMARATE 160-4.5 MCG/ACT IN AERO: 2.0000 | INHALATION_SPRAY | Freq: Two times a day (BID) | RESPIRATORY_TRACT | 5 refills | Status: DC
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
## 2016-07-21 NOTE — Assessment & Plan Note (Signed)
I feel her asthma is fairly well controlled now. However her symptoms of anxiety and depression seem to compound her asthma. These seem to be triggered by social stressors-her marital life is doing much better. I tried to provide her with some encouragement today Refills will be provided on Symbicort and albuterol-she can stop using Qvar since she is symptomatically much better. I'm again hopeful of avoiding parenteral steroids here

## 2016-07-21 NOTE — Patient Instructions (Addendum)
Refills on symbicort & albuterol
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
## 2016-07-21 NOTE — Assessment & Plan Note (Signed)
Generalized anxiety and panic attacks seem to be the main issue now, she has psychiatric follow-up

## 2016-07-27 ENCOUNTER — Encounter (HOSPITAL_BASED_OUTPATIENT_CLINIC_OR_DEPARTMENT_OTHER): Payer: Self-pay | Admitting: Emergency Medicine

## 2016-07-27 ENCOUNTER — Emergency Department (HOSPITAL_BASED_OUTPATIENT_CLINIC_OR_DEPARTMENT_OTHER)
Admission: EM | Admit: 2016-07-27 | Discharge: 2016-07-27 | Disposition: A | Discharge status: Home / Self Care | Payer: No Typology Code available for payment source | Attending: Emergency Medicine | Admitting: Emergency Medicine

## 2016-07-27 DIAGNOSIS — J45909 Unspecified asthma, uncomplicated: Secondary | ICD-10-CM | POA: Insufficient documentation

## 2016-07-27 DIAGNOSIS — F17210 Nicotine dependence, cigarettes, uncomplicated: Secondary | ICD-10-CM | POA: Insufficient documentation

## 2016-07-27 DIAGNOSIS — R0781 Pleurodynia: Secondary | ICD-10-CM

## 2016-07-27 DIAGNOSIS — T148 Other injury of unspecified body region: Secondary | ICD-10-CM | POA: Diagnosis not present

## 2016-07-27 MED ORDER — LIDOCAINE 5 % EX PTCH: 1.0000 | MEDICATED_PATCH | CUTANEOUS | 0 refills | Status: DC

## 2016-07-27 MED ORDER — NAPROXEN 500 MG PO TABS: 500.0000 mg | ORAL_TABLET | Freq: Two times a day (BID) | ORAL | 0 refills | Status: DC

## 2016-07-27 MED ORDER — METHOCARBAMOL 500 MG PO TABS: 500.0000 mg | ORAL_TABLET | Freq: Two times a day (BID) | ORAL | 0 refills | Status: DC
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
## 2016-07-27 NOTE — Discharge Instructions (Signed)
Take it easy, but do not lay around too much as this may make the stiffness worse. Take 500 mg of naproxen every 12 hours or 800 mg of ibuprofen every 8 hours for the next 3 days. Take these medications with food to avoid upset stomach. Robaxin is a muscle relaxer and may help loosen stiff muscles. Do not take the Robaxin while driving or performing other dangerous activities. °

## 2016-07-27 NOTE — ED Notes (Signed)
PA at bedside to evaluate pt.

## 2016-08-14 ENCOUNTER — Ambulatory Visit: Payer: Self-pay | Admitting: Pulmonary Disease

## 2016-11-17 ENCOUNTER — Ambulatory Visit (INDEPENDENT_AMBULATORY_CARE_PROVIDER_SITE_OTHER): Payer: BLUE CROSS/BLUE SHIELD | Admitting: Physician Assistant

## 2016-11-17 VITALS — BP 120/80 | HR 72 | Temp 98.1°F | Resp 18 | Ht 64.0 in | Wt 170.0 lb

## 2016-11-17 DIAGNOSIS — R1115 Cyclical vomiting syndrome unrelated to migraine: Secondary | ICD-10-CM

## 2016-11-17 DIAGNOSIS — R42 Dizziness and giddiness: Secondary | ICD-10-CM | POA: Diagnosis not present

## 2016-11-17 DIAGNOSIS — A09 Infectious gastroenteritis and colitis, unspecified: Secondary | ICD-10-CM

## 2016-11-17 DIAGNOSIS — G43A0 Cyclical vomiting, not intractable: Secondary | ICD-10-CM | POA: Diagnosis not present

## 2016-11-17 DIAGNOSIS — R197 Diarrhea, unspecified: Secondary | ICD-10-CM

## 2016-11-17 MED ORDER — KETOROLAC TROMETHAMINE 60 MG/2ML IM SOLN
60.0000 mg | Freq: Once | INTRAMUSCULAR | Status: AC
  Administered 2016-11-17: 60 mg via INTRAMUSCULAR

## 2016-11-17 MED ORDER — ONDANSETRON HCL 4 MG PO TABS: 4.0000 mg | ORAL_TABLET | Freq: Three times a day (TID) | ORAL | 0 refills | Status: DC | PRN

## 2016-11-17 MED ORDER — ONDANSETRON 4 MG PO TBDP
8.0000 mg | ORAL_TABLET | Freq: Once | ORAL | Status: AC
  Administered 2016-11-17: 8 mg via ORAL

## 2016-11-17 NOTE — Progress Notes (Signed)
11/17/2016 1:55 PM   DOB: [birth year] / MRN: [id number]  SUBJECTIVE:  Kelsey [name] is a 35 y.o. female presenting for HA that started three days ago.  Associates nausea with emesis roughly with 5-6 episodes in the last 24 hours, diarrhea hourly over the last 24 hour, generalized abdominal pain. She is dizzy when she stands up. Denies fever, chest pain, sore throat. She does not have any zofran at home.  She can not tolerate PO fluids.   She has a history of asthma but says this is not bothering her today.    She is allergic to penicillins.   She  has a past medical history of Anxiety; Asthma; Chronic bronchitis (HCC); Depression; Depression; GERD (gastroesophageal reflux disease); Insomnia; Palpitations; Pneumonia; Pre-syncope; PVC (premature ventricular contraction); Seasonal allergies; and Tachycardia.    She  reports that she has been smoking Cigarettes.  She has never used smokeless tobacco. She reports that she drinks alcohol. She reports that she uses drugs, including Marijuana. She  reports that she currently engages in sexual activity. She reports using the following method of birth control/protection: IUD. The patient  has a past surgical history that includes Cesarean section (10/15/2009, 02/08/2006).  Her family history includes Heart disease in her father; Lung cancer in her paternal grandmother; Rheumatologic disease in her mother.  Review of Systems  Constitutional: Negative for chills and fever.  Respiratory: Negative for cough and shortness of breath.   Cardiovascular: Negative for chest pain and leg swelling.  Gastrointestinal: Positive for abdominal pain (generalized), diarrhea, nausea and vomiting. Negative for blood in stool, constipation, heartburn and melena.  Neurological: Positive for dizziness.    The problem list and medications were reviewed and updated by myself where necessary and exist elsewhere in the encounter.   OBJECTIVE:  BP 116/78 (BP Location:  Right Arm, Patient Position: Sitting, Cuff Size: Small)   Pulse (!) 101   Temp 98.1 F (36.7 C) (Oral)   Resp 18   Ht 5\' 4"  (1.626 m)   Wt 170 lb (77.1 kg)   SpO2 94%   BMI 29.18 kg/m   Pulse Readings from Last 3 Encounters:  11/17/16 (!) 101  07/27/16 93  07/21/16 100   BP Readings from Last 3 Encounters:  11/17/16 116/78  07/27/16 125/82  07/21/16 (!) 162/102   Wt Readings from Last 3 Encounters:  11/17/16 170 lb (77.1 kg)  07/27/16 165 lb (74.8 kg)  07/21/16 168 lb (76.2 kg)   Lab Results  Component Value Date   CREATININE 0.60 06/30/2016   Orthostatic VS for the past 24 hrs:  BP- Lying Pulse- Lying BP- Sitting Pulse- Sitting BP- Standing at 0 minutes Pulse- Standing at 0 minutes  11/17/16 1231 123/87 87 121/88 80 (!) 133/94 109       Physical Exam  Constitutional: She is oriented to person, place, and time. She appears well-nourished. No distress.  Eyes: EOM are normal. Pupils are equal, round, and reactive to light.  Cardiovascular: Normal rate.   Pulmonary/Chest: Effort normal.  Abdominal: Soft. Bowel sounds are normal. She exhibits no distension and no mass. There is no tenderness. There is no rebound and no guarding.  Neurological: She is alert and oriented to person, place, and time. No cranial nerve deficit. Gait normal.  Skin: Skin is dry. She is not diaphoretic.  Psychiatric: She has a normal mood and affect.  Vitals reviewed.   No results found for this or any previous visit (from the past 72 hour(s)).  No results found.  ASSESSMENT AND PLAN  Victorino DikeJennifer was seen today for nausea, emesis and generalized body aches.  Diagnoses and all orders for this visit:  Diarrhea of presumed infectious origin Comments: See HPI. Her whole family has been sick.  She is better after 60 of toradol, 1 liter of saline, and some zofran.  Will see her back as needed.   Orthostatic dizziness -     Insert peripheral IV -     ketorolac (TORADOL) injection 60 mg;  Inject 2 mLs (60 mg total) into the muscle once. -     ondansetron (ZOFRAN-ODT) disintegrating tablet 8 mg; Take 2 tablets (8 mg total) by mouth once. -     Orthostatic vital signs  Non-intractable cyclical vomiting with nausea -     ondansetron (ZOFRAN) 4 MG tablet; Take 1 tablet (4 mg total) by mouth every 8 (eight) hours as needed for nausea or vomiting.    The patient is advised to call or return to clinic if she does not see an improvement in symptoms, or to seek the care of the closest emergency department if she worsens with the above plan.   Deliah BostonMichael Pakula, MHS, PA-C [facility] 11/17/2016 1:55 PM

## 2016-11-17 NOTE — Patient Instructions (Addendum)
Please come back in if you are unable to consume fluids by mouth.     IF you received an x-ray today, you will receive an invoice from [facility]. Please contact [facility] at [phone] with questions or concerns regarding your invoice.   IF you received labwork today, you will receive an invoice from [facility]. Please contact [facility] at [phone] with questions or concerns regarding your invoice.   Our billing staff will not be able to assist you with questions regarding bills from these companies.  You will be contacted with the lab results as soon as they are available. The fastest way to get your results is to activate your My Chart account. Instructions are located on the last page of this paperwork. If you have not heard from us regarding the results in 2 weeks, please contact this office.

## 2016-11-21 ENCOUNTER — Observation Stay (HOSPITAL_BASED_OUTPATIENT_CLINIC_OR_DEPARTMENT_OTHER)
Admission: EM | Admit: 2016-11-21 | Discharge: 2016-11-23 | Disposition: A | Discharge status: Home / Self Care | Payer: BLUE CROSS/BLUE SHIELD | Attending: Surgery | Admitting: Surgery

## 2016-11-21 ENCOUNTER — Ambulatory Visit (INDEPENDENT_AMBULATORY_CARE_PROVIDER_SITE_OTHER): Payer: BLUE CROSS/BLUE SHIELD | Admitting: Physician Assistant

## 2016-11-21 ENCOUNTER — Encounter (HOSPITAL_BASED_OUTPATIENT_CLINIC_OR_DEPARTMENT_OTHER): Payer: Self-pay | Admitting: *Deleted

## 2016-11-21 VITALS — BP 108/80 | HR 101 | Temp 98.0°F | Resp 18 | Ht 64.0 in | Wt 173.0 lb

## 2016-11-21 DIAGNOSIS — Z7951 Long term (current) use of inhaled steroids: Secondary | ICD-10-CM | POA: Insufficient documentation

## 2016-11-21 DIAGNOSIS — F338 Other recurrent depressive disorders: Secondary | ICD-10-CM | POA: Diagnosis not present

## 2016-11-21 DIAGNOSIS — Z793 Long term (current) use of hormonal contraceptives: Secondary | ICD-10-CM | POA: Diagnosis not present

## 2016-11-21 DIAGNOSIS — F411 Generalized anxiety disorder: Secondary | ICD-10-CM | POA: Diagnosis not present

## 2016-11-21 DIAGNOSIS — K610 Anal abscess: Principal | ICD-10-CM | POA: Diagnosis present

## 2016-11-21 DIAGNOSIS — I10 Essential (primary) hypertension: Secondary | ICD-10-CM | POA: Diagnosis not present

## 2016-11-21 DIAGNOSIS — J45909 Unspecified asthma, uncomplicated: Secondary | ICD-10-CM | POA: Diagnosis not present

## 2016-11-21 DIAGNOSIS — K219 Gastro-esophageal reflux disease without esophagitis: Secondary | ICD-10-CM | POA: Diagnosis not present

## 2016-11-21 DIAGNOSIS — E876 Hypokalemia: Secondary | ICD-10-CM

## 2016-11-21 DIAGNOSIS — Z87891 Personal history of nicotine dependence: Secondary | ICD-10-CM | POA: Insufficient documentation

## 2016-11-21 DIAGNOSIS — Z79899 Other long term (current) drug therapy: Secondary | ICD-10-CM | POA: Insufficient documentation

## 2016-11-21 DIAGNOSIS — K611 Rectal abscess: Secondary | ICD-10-CM | POA: Diagnosis not present

## 2016-11-21 DIAGNOSIS — K6289 Other specified diseases of anus and rectum: Secondary | ICD-10-CM | POA: Diagnosis present

## 2016-11-21 LAB — POCT CBC
Granulocyte percent: 84.9 %G — AB (ref 37–80)
HEMATOCRIT: 35.2 % — AB (ref 37.7–47.9)
HEMOGLOBIN: 12.5 g/dL (ref 12.2–16.2)
LYMPH, POC: 1.4 (ref 0.6–3.4)
MCH, POC: 32.4 pg — AB (ref 27–31.2)
MCHC: 35.6 g/dL — AB (ref 31.8–35.4)
MCV: 91 fL (ref 80–97)
MID (cbc): 0.7 (ref 0–0.9)
MPV: 6.7 fL (ref 0–99.8)
PLATELET COUNT, POC: 264 10*3/uL (ref 142–424)
POC GRANULOCYTE: 12 — AB (ref 2–6.9)
POC LYMPH PERCENT: 10.1 %L (ref 10–50)
POC MID %: 5 %M (ref 0–12)
RBC: 3.87 M/uL — AB (ref 4.04–5.48)
RDW, POC: 12.6 %
WBC: 14.1 10*3/uL — AB (ref 4.6–10.2)

## 2016-11-21 MED ORDER — HYDROCODONE-ACETAMINOPHEN 5-325 MG PO TABS: 1.0000 | ORAL_TABLET | Freq: Four times a day (QID) | ORAL | 0 refills | Status: DC | PRN

## 2016-11-21 MED ORDER — CIPROFLOXACIN HCL 500 MG PO TABS: 500.0000 mg | ORAL_TABLET | Freq: Two times a day (BID) | ORAL | 0 refills | Status: DC

## 2016-11-21 MED ORDER — METRONIDAZOLE 500 MG PO TABS: 500.0000 mg | ORAL_TABLET | Freq: Three times a day (TID) | ORAL | 0 refills | Status: DC

## 2016-11-21 NOTE — ED Triage Notes (Signed)
Ptc/o abscess to anal area x 5  days, seen by PMD today abx given , c/o no improvement

## 2016-11-21 NOTE — Progress Notes (Signed)
Kelsey [name]  MRN: [id number] DOB: June 05, 1981  Subjective:  Kelsey [name] is a 35 y.o. female seen in office today for a chief complaint of exquisite left side anal pain x 3 days. She was seen on 11/17/16 for a GI bug of 3 days and was having diarrhea every hour. Since that visit, she was placed on zofran and stopped having diarrhea. Her last bowel movement was yesterday. Notes it was normal consistency. She denies bloody stools. She has tried sitting in sitz bath and epson bath salt with no relief.   Review of Systems  Constitutional: Negative for chills, diaphoresis and fever.   Patient Active Problem List   Diagnosis Date Noted  . Asthma 10/15/2015  . Allergic rhinitis 10/15/2015  . Alcohol abuse 07/14/2015  . GAD (generalized anxiety disorder) 07/14/2015  . Depression, major, recurrent, moderate (HCC) 07/14/2015  . Periscapular pain 01/19/2015  . Left-sided thoracic back pain-greater than 20 years duration 01/12/2015  . GERD (gastroesophageal reflux disease) 08/22/2013  . 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 Abnormal ECG- T  ST changes in V3 through V5 05/02/2011    Current Outpatient Prescriptions on File Prior to Visit  Medication Sig Dispense Refill  . albuterol (PROAIR HFA) 108 (90 Base) MCG/ACT inhaler INHALE 2 PUFFS INTO THE LUNGS EVERY 4 HOURS AS NEEDED (FOR SHORTNESS OF BREATH). 8.5 g 5  . albuterol (PROVENTIL) (2.5 MG/3ML) 0.083% nebulizer solution Take 3 mLs (2.5 mg total) by nebulization every 4 (four) hours as needed for wheezing or shortness of breath. 45 mL 3  . beclomethasone (QVAR) 80 MCG/ACT inhaler Inhale 1 puff into the lungs 2 (two) times daily. 1 Inhaler 2  . budesonide-formoterol (SYMBICORT) 160-4.5 MCG/ACT inhaler Inhale 2 puffs into the lungs 2 (two) times daily. 1 Inhaler 5  . chlordiazePOXIDE (LIBRIUM) 25 MG capsule Take 25 mg by mouth 3 (three) times daily.    . cyclobenzaprine (FLEXERIL) 10 MG tablet Take 1 tablet (10 mg  total) by mouth 2 (two) times daily as needed for muscle spasms. 20 tablet 0  . esomeprazole (NEXIUM) 20 MG packet Take 20 mg by mouth daily before breakfast. 30 each 12  . gabapentin (NEURONTIN) 400 MG capsule Take 400 mg by mouth 4 (four) times daily.   0  . levonorgestrel (MIRENA) 20 MCG/24HR IUD 1 Intra Uterine Device (1 each total) by Intrauterine route once. 1 each 0  . mirtazapine (REMERON) 45 MG tablet Take 1 tablet (45 mg total) by mouth at bedtime. 30 tablet 0  . montelukast (SINGULAIR) 10 MG tablet Take 1 tablet (10 mg total) by mouth at bedtime. 30 tablet 0  . ondansetron (ZOFRAN) 4 MG tablet Take 1 tablet (4 mg total) by mouth every 8 (eight) hours as needed for nausea or vomiting. 20 tablet 0  . [DISCONTINUED] atenolol (TENORMIN) 25 MG tablet Take 25 mg by mouth daily.    . [DISCONTINUED] diphenhydrAMINE (BENADRYL) 25 MG tablet Take by mouth daily as needed.      . [DISCONTINUED] metoprolol succinate (TOPROL-XL) 25 MG 24 hr tablet Take 25 mg by mouth daily.    . [DISCONTINUED] metoprolol tartrate (LOPRESSOR) 25 MG tablet Take 1 tablet (25 mg total) by mouth 2 (two) times daily. 28 tablet 0   No current facility-administered medications on file prior to visit.     Allergies  Allergen Reactions  . Penicillins Rash    Has patient had a PCN reaction causing immediate rash, facial/tongue/throat swelling, SOB or lightheadedness  with hypotension: No Has patient had a PCN reaction causing severe rash involving mucus membranes or skin necrosis: No Has patient had a PCN reaction that required hospitalization No Has patient had a PCN reaction occurring within the last 10 years: No If all of the above answers are "NO", then may proceed with Cephalosporin use.       Social History   Social History  . Marital status: Married    Spouse name: N/A  . Number of children: 2  . Years of education: N/A   Occupational History  . 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 Smoking status: Former Smoker    Types: Cigarettes  . Smokeless tobacco: Never Used  . Alcohol use 0.0 oz/week     Comment: 12/13/2015 "stopped 06/2015"  . Drug use:     Types: Marijuana     Comment: 12/13/2015 "stopped 06/2015"  . Sexual activity: Yes    Birth control/ protection: IUD   Other Topics Concern  . Not on file   Social History Narrative  . No narrative on file    Objective:  BP 108/80   Pulse (!) 101   Temp 98 F (36.7 C) (Oral)   Resp 18   Ht 5\' 4"  (1.626 m)   Wt 173 lb (78.5 kg)   SpO2 96%   BMI 29.70 kg/m   Physical Exam  Constitutional: She is oriented to person, place, and time. She appears distressed (moderate due to pain).  HENT:  Head: Normocephalic and atraumatic.  Eyes: Conjunctivae are normal.  Neck: Normal range of motion.  Pulmonary/Chest: Effort normal.  Genitourinary: Rectal exam shows tenderness ( with associated perianal erythema and warmth encompassing left buttock and extending into right buttock and most inferior protion of vulva).  Genitourinary Comments: Hemorrhoidal skin tag noted at 5 o clock position. No active hemorrhoids noted. No fluctuance noted.   Neurological: She is alert and oriented to person, place, and time. Gait normal.  Skin: Skin is warm and dry.  Psychiatric: Affect normal.  Vitals reviewed.  Results for orders placed or performed in visit on 11/21/16 (from the past 24 hour(s))  POCT CBC     Status: Abnormal   Collection Time: 11/21/16  4:58 PM  Result Value Ref Range   WBC 14.1 (A) 4.6 - 10.2 K/uL   Lymph, poc 1.4 0.6 - 3.4   POC LYMPH PERCENT 10.1 10 - 50 %L   MID (cbc) 0.7 0 - 0.9   POC MID % 5.0 0 - 12 %M   POC Granulocyte 12.0 (A) 2 - 6.9   Granulocyte percent 84.9 (A) 37 - 80 %G   RBC 3.87 (A) 4.04 - 5.48 M/uL   Hemoglobin 12.5 12.2 - 16.2 g/dL   HCT, POC 16.1 (A) 09.6 - 47.9 %   MCV 91.0 80 - 97 fL   MCH, POC 32.4 (A) 27 - 31.2 pg   MCHC 35.6 (A) 31.8 - 35.4 g/dL   RDW, POC 04.5 %    Platelet Count, POC 264 142 - 424 K/uL   MPV 6.7 0 - 99.8 fL    Assessment and Plan :  1. Perianal cellulitis -Due to physical exam findings, I&D was not appropriate due to lack of fluctuance and induration. Will reevaluate in 24 hours. Pt given prescription for oral antibiotics and instructed to go to the ER immediately over night if she develops any worsening symptom or any new fever, chills, or diaphoresis.  - POCT  CBC - HYDROcodone-acetaminophen (NORCO) 5-325 MG tablet; Take 1 tablet by mouth every 6 (six) hours as needed.  Dispense: 10 tablet; Refill: 0 - metroNIDAZOLE (FLAGYL) 500 MG tablet; Take 1 tablet (500 mg total) by mouth 3 (three) times daily. DO NOT CONSUME ALCOHOL WHILE TAKING THIS MEDICATION.  Dispense: 21 tablet; Refill: 0 - ciprofloxacin (CIPRO) 500 MG tablet; Take 1 tablet (500 mg total) by mouth 2 (two) times daily.  Dispense: 14 tablet; Refill: 0   Benjiman CoreBrittany Analiese Krupka PA-C  [facility] 11/21/2016 4:26 PM

## 2016-11-21 NOTE — ED Provider Notes (Signed)
[facility] Provider Note   CSN: [id number] Arrival date & time: 11/21/16  2341  By signing my name below, I, Rosario AdieWilliam Andrew Hiatt, attest that this documentation has been prepared under the direction and in the presence of Zadie Rhineonald Rollande Thursby, MD. Electronically Signed: Rosario AdieWilliam Andrew Hiatt, ED Scribe. 11/22/16. 12:08 AM.  History   Chief Complaint Chief Complaint  Patient presents with  . Abscess   The history is provided by the patient and medical records. No language interpreter was used.  Rash   This is a new problem. The current episode started more than 2 days ago. The problem has been gradually worsening. Associated with: recent illness. There has been no fever. Affected Location: perianal region. The pain has been constant since onset. Associated symptoms include pain. Treatments tried: Flagyl. The treatment provided no relief.    HPI Comments: Chrystie NoseJennifer L Keen is a 35 y.o. female with a PMhx of asthma, who presents to the Emergency Department complaining of a moderate, gradually worsening area of pain and redness to the perianal region onset approximately 5 days ago. She notes associated generalized abdominal pain secondary to the onset of this issue. She notes that prior to the onset of this issue that she had a "GI bug" with intermittent diarrhea. Pt was seen by her PCP earlier in the day for this issue where she was dx'd with perianal cellulitis, per medical records. At that time she was rx'd Flagyl. Pt has taken a dose of this tonight prior to coming into the ED without relief of her symptoms. Pt states pain is exacerbated with palpation and direct pressure. No h/o DM. Denies fever, chills, nausea, vomiting, or any other associated symptoms.   Past Medical History:  Diagnosis Date  . Anxiety   . Asthma   . Chronic bronchitis (HCC)    "get it pretty much q yr" (12/13/2015)  . Depression   . Depression   . GERD (gastroesophageal reflux disease)   . Insomnia   .  Palpitations   . Pneumonia    "multiple times when I was a child; once as an adult so far" (12/13/2015)  . Pre-syncope   . PVC (premature ventricular contraction)   . Seasonal allergies   . Tachycardia    Patient Active Problem List   Diagnosis Date Noted  . Asthma 10/15/2015  . Allergic rhinitis 10/15/2015  . Alcohol abuse 07/14/2015  . GAD (generalized anxiety disorder) 07/14/2015  . Depression, major, recurrent, moderate (HCC) 07/14/2015  . Periscapular pain 01/19/2015  . Left-sided thoracic back pain-greater than 20 years duration 01/12/2015  . GERD (gastroesophageal reflux disease) 08/22/2013  . 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 Abnormal ECG- T  ST changes in V3 through V5 05/02/2011   Past Surgical History:  Procedure Laterality Date  . CESAREAN SECTION  10/15/2009, 02/08/2006   OB History    No data available     Home Medications    Prior to Admission medications   Medication Sig Start Date End Date Taking? Authorizing Provider  ciprofloxacin (CIPRO) 500 MG tablet Take 1 tablet (500 mg total) by mouth 2 (two) times daily. 11/21/16 11/28/16 Yes Magdalene RiverBrittany D Wiseman, PA-C  HYDROcodone-acetaminophen (NORCO) 5-325 MG tablet Take 1 tablet by mouth every 6 (six) hours as needed. 11/21/16  Yes Magdalene RiverBrittany D Wiseman, PA-C  metroNIDAZOLE (FLAGYL) 500 MG tablet Take 1 tablet (500 mg total) by mouth 3 (three) times daily. DO NOT CONSUME ALCOHOL WHILE TAKING THIS MEDICATION. 11/21/16  Yes GrenadaBrittany  D Wiseman, PA-C  albuterol (PROAIR HFA) 108 (90 Base) MCG/ACT inhaler INHALE 2 PUFFS INTO THE LUNGS EVERY 4 HOURS AS NEEDED (FOR SHORTNESS OF BREATH). 07/21/16   Oretha Milchakesh V Alva, MD  albuterol (PROVENTIL) (2.5 MG/3ML) 0.083% nebulizer solution Take 3 mLs (2.5 mg total) by nebulization every 4 (four) hours as needed for wheezing or shortness of breath. 12/16/15   Bernadene PersonKathryn A Whiteheart, NP  beclomethasone (QVAR) 80 MCG/ACT inhaler Inhale 1 puff into the lungs 2 (two) times daily.  12/16/15   Bernadene PersonKathryn A Whiteheart, NP  budesonide-formoterol (SYMBICORT) 160-4.5 MCG/ACT inhaler Inhale 2 puffs into the lungs 2 (two) times daily. 07/21/16   Oretha Milchakesh V Alva, MD  chlordiazePOXIDE (LIBRIUM) 25 MG capsule Take 25 mg by mouth 3 (three) times daily.    Historical Provider, MD  cyclobenzaprine (FLEXERIL) 10 MG tablet Take 1 tablet (10 mg total) by mouth 2 (two) times daily as needed for muscle spasms. 07/01/16   Fayrene HelperBowie Tran, PA-C  esomeprazole (NEXIUM) 20 MG packet Take 20 mg by mouth daily before breakfast. 12/16/15   Bernadene PersonKathryn A Whiteheart, NP  gabapentin (NEURONTIN) 400 MG capsule Take 400 mg by mouth 4 (four) times daily.  09/13/15   Historical Provider, MD  levonorgestrel (MIRENA) 20 MCG/24HR IUD 1 Intra Uterine Device (1 each total) by Intrauterine route once. 07/16/15   Adonis BrookSheila Agustin, NP  mirtazapine (REMERON) 45 MG tablet Take 1 tablet (45 mg total) by mouth at bedtime. 07/16/15   Adonis BrookSheila Agustin, NP  montelukast (SINGULAIR) 10 MG tablet Take 1 tablet (10 mg total) by mouth at bedtime. 12/16/15   Bernadene PersonKathryn A Whiteheart, NP  ondansetron (ZOFRAN) 4 MG tablet Take 1 tablet (4 mg total) by mouth every 8 (eight) hours as needed for nausea or vomiting. 11/17/16   Ofilia NeasMichael L Meiner, PA-C   Family History Family History  Problem Relation Age of Onset  . Heart disease Father   . Rheumatologic disease Mother   . Lung cancer Paternal Grandmother    Social History Social History  Substance Use Topics  . Smoking status: Former Smoker    Types: Cigarettes  . Smokeless tobacco: Never Used  . Alcohol use 0.0 oz/week     Comment: 12/13/2015 "stopped 06/2015"   Allergies   Penicillins  Review of Systems Review of Systems  Constitutional: Negative for chills and fever.  Gastrointestinal: Negative for nausea and vomiting.  Musculoskeletal: Positive for myalgias.  Skin: Positive for color change and rash.  All other systems reviewed and are negative.  Physical Exam Updated Vital Signs BP 113/76    Pulse 114   Temp 98.4 F (36.9 C)   Resp 18   Ht 5\' 4"  (1.626 m)   Wt 170 lb (77.1 kg)   SpO2 100%   BMI 29.18 kg/m   Physical Exam  CONSTITUTIONAL: Uncomfortable appearing HEAD: Normocephalic/atraumatic EYES: EOMI/PERRL ENMT: Mucous membranes moist NECK: supple no meningeal signs SPINE/BACK:entire spine nontender CV: S1/S2 noted, no murmurs/rubs/gallops noted LUNGS: Lungs are clear to auscultation bilaterally, no apparent distress ABDOMEN: soft, nontender, no rebound or guarding, bowel sounds noted throughout abdomen Rectal -  Nurse chaperone present for exam.  Significant erythema to both buttocks with mild induration, no fluctuance.  No fluctuance surrounding rectum.  The erythema extends into vulvar region.  No crepitus NEURO: Pt is awake/alert/appropriate, moves all extremitiesx4.  No facial droop.   EXTREMITIES: pulses normal/equal, full ROM SKIN: warm, color normal PSYCH: no abnormalities of mood noted, alert and oriented to situation  ED Treatments / Results  DIAGNOSTIC STUDIES: Oxygen Saturation is 100% on RA, normal by my interpretation.   COORDINATION OF CARE: 12:05 AM-Discussed next steps with pt. Pt verbalized understanding and is agreeable with the plan.   Labs (all labs ordered are listed, but only abnormal results are displayed) Labs Reviewed  BASIC METABOLIC PANEL - Abnormal; Notable for the following:       Result Value   Sodium 132 (*)    Potassium 2.6 (*)    Chloride 99 (*)    Glucose, Bld 106 (*)    Calcium 8.1 (*)    All other components within normal limits  HCG, QUANTITATIVE, PREGNANCY    EKG  EKG Interpretation  Date/Time:  Wednesday November 22 2016 01:10:53 EST Ventricular Rate:  92 PR Interval:    QRS Duration: 87 QT Interval:  328 QTC Calculation: 406 R Axis:   21 Text Interpretation:  Sinus rhythm Low voltage, extremity and precordial leads ?u wave in V3 Abnormal ekg Confirmed by Bebe Shaggy  MD, Mike Hamre ([id number]) on 11/22/2016  1:17:52 AM      Radiology Ct Pelvis W Contrast  Result Date: 11/22/2016 CLINICAL DATA:  Perianal cellulitis and pain x5 days. Started antibiotics today without relief. EXAM: CT PELVIS WITH CONTRAST TECHNIQUE: Multidetector CT imaging of the pelvis was performed using the standard protocol following the bolus administration of intravenous contrast. CONTRAST:  ISOVUE-300 IOPAMIDOL (ISOVUE-300) INJECTION 61% COMPARISON:  CT from 06/30/2016 FINDINGS: Urinary Tract:  Unremarkable appearance of the bladder. Bowel:  Unremarkable. Vascular/Lymphatic: No pathologically enlarged lymph nodes. No significant vascular abnormality seen. Reproductive: Intrauterine device noted within the uterus. No adnexal mass. Corpus luteum noted of the left ovary. Other: Bilobed perianal fluid collection with enhancement consistent with perianal abscess, the largest component is on the left and overall measuring 3.3 cm AP x 3.1 cm transverse by 5 cm craniocaudad. Musculoskeletal: No acute abnormality. IMPRESSION: Bilobed perianal abscess measuring 3.3 x 3.1 x 5 cm. Involuting follicle/cyst or corpus luteum on the left. Intrauterine device noted along the expected course of the endometrial cavity. Electronically Signed   By: Tollie Eth M.D.   On: 11/22/2016 02:22    Procedures Procedures    Medications Ordered in ED Medications  potassium chloride 10 mEq in 100 mL IVPB (10 mEq Intravenous New Bag/Given 11/22/16 0212)  potassium chloride SA (K-DUR,KLOR-CON) CR tablet 40 mEq (not administered)  magnesium sulfate (IV Push/IM) injection 2 g (not administered)  metroNIDAZOLE (FLAGYL) tablet 500 mg (not administered)  albuterol (PROVENTIL) (2.5 MG/3ML) 0.083% nebulizer solution 5 mg (not administered)  fentaNYL (SUBLIMAZE) injection 50 mcg (not administered)  sodium chloride 0.9 % bolus 1,000 mL (0 mLs Intravenous Stopped 11/22/16 0215)  fentaNYL (SUBLIMAZE) injection 100 mcg (100 mcg Intravenous Given 11/22/16 0033)    iopamidol (ISOVUE-300) 61 % injection 100 mL (100 mLs Intravenous Contrast Given 11/22/16 0208)    Initial Impression / Assessment and Plan / ED Course  I have reviewed the triage vital signs and the nursing notes.  Pertinent labs & imaging results that were available during my care of the patient were reviewed by me and considered in my medical decision making (see chart for details).  Clinical Course    12:15 AM Pt with worsening perianal cellulitis No focal abscess identified Due to severe pain and escalating symptoms, will proceed with CT pelvis She has taken meds already prior to ED arrival 1:24 AM Pt with significant HYPOkalemia IV potassium ordered Imaging pending at this time 2:53 AM Discussed imaging findings with dr Maisie Fus with  surgery She has reviewed She accepts for admission to [facility] and she plans to have Dr Ezzard Standing evaluate patient in the morning for surgical drainage Will continue antibiotics - flagyl ordered Pt did have hypokalemia without significant EKG changes, potassium/magnesium ordered Pt updated on plan She requests nebulized treatment as she feels her asthma is "Acting up" no respiratory distress noted Albuterol ordered  Final Clinical Impressions(s) / ED Diagnoses   Final diagnoses:  Perianal cellulitis  Hypokalemia  Perianal abscess   New Prescriptions New Prescriptions   No medications on file   I personally performed the services described in this documentation, which was scribed in my presence. The recorded information has been reviewed and is accurate.       Zadie Rhine, MD 11/22/16 [phone]

## 2016-11-21 NOTE — Patient Instructions (Addendum)
Take antibiotic as prescribed, beginning today. Follow up with me in clinic tomorrow morning. You may use pain meds for the pain however, they can lead to constipation which could lead to hemorrhoids and straining therefore worsening the pain. If any of your symptoms get worse, or you develop fever, chills, or sweating, go to ED immediately.     Cellulitis, Adult Introduction Cellulitis is a skin infection. The infected area is usually red and sore. This condition occurs most often in the arms and lower legs. It is very important to get treated for this condition. Follow these instructions at home:  Take over-the-counter and prescription medicines only as told by your doctor.  If you were prescribed an antibiotic medicine, take it as told by your doctor. Do not stop taking the antibiotic even if you start to feel better.  Drink enough fluid to keep your pee (urine) clear or pale yellow.  Do not touch or rub the infected area.  Raise (elevate) the infected area above the level of your heart while you are sitting or lying down.  Place warm or cold wet cloths (warm or cold compresses) on the infected area. Do this as told by your doctor.  Keep all follow-up visits as told by your doctor. This is important. These visits let your doctor make sure your infection is not getting worse. Contact a doctor if:  You have a fever.  Your symptoms do not get better after 1-2 days of treatment.  Your bone or joint under the infected area starts to hurt after the skin has healed.  Your infection comes back. This can happen in the same area or another area.  You have a swollen bump in the infected area.  You have new symptoms.  You feel ill and also have muscle aches and pains. Get help right away if:  Your symptoms get worse.  You feel very sleepy.  You throw up (vomit) or have watery poop (diarrhea) for a long time.  There are red streaks coming from the infected area.  Your red area  gets larger.  Your red area turns darker. This information is not intended to replace advice given to you by your health care provider. Make sure you discuss any questions you have with your health care provider. Document Released: 05/01/2008 Document Revised: 04/20/2016 Document Reviewed: 09/22/2015  2017 Elsevier     IF you received an x-ray today, you will receive an invoice from [facility]. Please contact [facility] at [phone] with questions or concerns regarding your invoice.   IF you received labwork today, you will receive an invoice from [facility]. Please contact [facility] at [phone] with questions or concerns regarding your invoice.   Our billing staff will not be able to assist you with questions regarding bills from these companies.  You will be contacted with the lab results as soon as they are available. The fastest way to get your results is to activate your My Chart account. Instructions are located on the last page of this paperwork. If you have not heard from us regarding the results in 2 weeks, please contact this office.

## 2016-11-22 ENCOUNTER — Telehealth: Payer: Self-pay | Admitting: Physician Assistant

## 2016-11-22 ENCOUNTER — Ambulatory Visit: Payer: BLUE CROSS/BLUE SHIELD | Admitting: Physician Assistant

## 2016-11-22 ENCOUNTER — Encounter (HOSPITAL_COMMUNITY)
Admission: EM | Disposition: A | Discharge status: Home / Self Care | Payer: Self-pay | Source: Home / Self Care | Attending: Emergency Medicine

## 2016-11-22 ENCOUNTER — Observation Stay (HOSPITAL_COMMUNITY): Payer: BLUE CROSS/BLUE SHIELD | Admitting: Certified Registered Nurse Anesthetist

## 2016-11-22 ENCOUNTER — Telehealth: Payer: Self-pay

## 2016-11-22 ENCOUNTER — Emergency Department (HOSPITAL_BASED_OUTPATIENT_CLINIC_OR_DEPARTMENT_OTHER): Payer: BLUE CROSS/BLUE SHIELD

## 2016-11-22 ENCOUNTER — Encounter (HOSPITAL_COMMUNITY): Payer: Self-pay

## 2016-11-22 DIAGNOSIS — K610 Anal abscess: Secondary | ICD-10-CM | POA: Diagnosis present

## 2016-11-22 HISTORY — DX: Anal abscess: K61.0

## 2016-11-22 HISTORY — PX: INCISION AND DRAINAGE PERIRECTAL ABSCESS: SHX1804

## 2016-11-22 LAB — BASIC METABOLIC PANEL
ANION GAP: 8 (ref 5–15)
Anion gap: 7 (ref 5–15)
BUN: 10 mg/dL (ref 6–20)
BUN: 8 mg/dL (ref 6–20)
CHLORIDE: 102 mmol/L (ref 101–111)
CO2: 26 mmol/L (ref 22–32)
CO2: 26 mmol/L (ref 22–32)
CREATININE: 0.61 mg/dL (ref 0.44–1.00)
Calcium: 7.6 mg/dL — ABNORMAL LOW (ref 8.9–10.3)
Calcium: 8.1 mg/dL — ABNORMAL LOW (ref 8.9–10.3)
Chloride: 99 mmol/L — ABNORMAL LOW (ref 101–111)
Creatinine, Ser: 0.58 mg/dL (ref 0.44–1.00)
GFR calc Af Amer: >60 mL/min (ref >60)
GFR calc non Af Amer: >60 mL/min (ref >60)
GLUCOSE: 101 mg/dL — AB (ref 65–99)
Glucose, Bld: 106 mg/dL — ABNORMAL HIGH (ref 65–99)
POTASSIUM: 2.6 mmol/L — AB (ref 3.5–5.1)
POTASSIUM: 3.1 mmol/L — AB (ref 3.5–5.1)
SODIUM: 132 mmol/L — AB (ref 135–145)
SODIUM: 136 mmol/L (ref 135–145)

## 2016-11-22 LAB — CBC
HEMATOCRIT: 34.6 % — AB (ref 36.0–46.0)
HEMOGLOBIN: 11.8 g/dL — AB (ref 12.0–15.0)
MCH: 32 pg (ref 26.0–34.0)
MCHC: 34.1 g/dL (ref 30.0–36.0)
MCV: 93.8 fL (ref 78.0–100.0)
Platelets: 290 10*3/uL (ref 150–400)
RBC: 3.69 MIL/uL — ABNORMAL LOW (ref 3.87–5.11)
RDW: 12.8 % (ref 11.5–15.5)
WBC: 15.7 10*3/uL — AB (ref 4.0–10.5)

## 2016-11-22 LAB — HCG, QUANTITATIVE, PREGNANCY

## 2016-11-22 SURGERY — INCISION AND DRAINAGE, ABSCESS, PERIRECTAL
Anesthesia: General | Site: Buttocks | Laterality: Bilateral

## 2016-11-22 MED ORDER — HYDROMORPHONE HCL 1 MG/ML IJ SOLN
INTRAMUSCULAR | Status: AC
  Filled 2016-11-22: qty 1

## 2016-11-22 MED ORDER — POTASSIUM CHLORIDE 10 MEQ/100ML IV SOLN
10.0000 meq | Freq: Once | INTRAVENOUS | Status: AC
  Administered 2016-11-22: 10 meq via INTRAVENOUS
  Filled 2016-11-22: qty 100

## 2016-11-22 MED ORDER — MAGNESIUM SULFATE 50 % IJ SOLN
2.0000 g | Freq: Once | INTRAMUSCULAR | Status: DC
  Filled 2016-11-22: qty 4

## 2016-11-22 MED ORDER — METRONIDAZOLE 500 MG PO TABS
500.0000 mg | ORAL_TABLET | Freq: Once | ORAL | Status: AC
  Administered 2016-11-22: 500 mg via ORAL
  Filled 2016-11-22: qty 1

## 2016-11-22 MED ORDER — METRONIDAZOLE IN NACL 5-0.79 MG/ML-% IV SOLN
500.0000 mg | Freq: Once | INTRAVENOUS | Status: DC
Start: 2016-11-22 — End: 2016-11-22

## 2016-11-22 MED ORDER — FENTANYL CITRATE (PF) 100 MCG/2ML IJ SOLN
50.0000 ug | Freq: Once | INTRAMUSCULAR | Status: AC
  Administered 2016-11-22: 50 ug via INTRAVENOUS
  Filled 2016-11-22: qty 2

## 2016-11-22 MED ORDER — ALBUTEROL SULFATE HFA 108 (90 BASE) MCG/ACT IN AERS
INHALATION_SPRAY | RESPIRATORY_TRACT | Status: AC
  Filled 2016-11-22: qty 6.7

## 2016-11-22 MED ORDER — FENTANYL CITRATE (PF) 100 MCG/2ML IJ SOLN
INTRAMUSCULAR | Status: AC
  Filled 2016-11-22: qty 2

## 2016-11-22 MED ORDER — ALBUTEROL SULFATE (2.5 MG/3ML) 0.083% IN NEBU
2.5000 mg | INHALATION_SOLUTION | Freq: Four times a day (QID) | RESPIRATORY_TRACT | Status: DC | PRN
  Administered 2016-11-23: 2.5 mg via RESPIRATORY_TRACT
  Filled 2016-11-22: qty 3

## 2016-11-22 MED ORDER — ALBUTEROL SULFATE HFA 108 (90 BASE) MCG/ACT IN AERS: 2.0000 | INHALATION_SPRAY | RESPIRATORY_TRACT | Status: DC | PRN

## 2016-11-22 MED ORDER — ALBUTEROL SULFATE (2.5 MG/3ML) 0.083% IN NEBU
5.0000 mg | INHALATION_SOLUTION | Freq: Once | RESPIRATORY_TRACT | Status: AC
  Administered 2016-11-22: 5 mg via RESPIRATORY_TRACT
  Filled 2016-11-22: qty 6

## 2016-11-22 MED ORDER — ONDANSETRON HCL 4 MG/2ML IJ SOLN: 4.0000 mg | Freq: Four times a day (QID) | INTRAMUSCULAR | Status: DC | PRN

## 2016-11-22 MED ORDER — FENTANYL CITRATE (PF) 100 MCG/2ML IJ SOLN
100.0000 ug | Freq: Once | INTRAMUSCULAR | Status: AC
  Administered 2016-11-22: 100 ug via INTRAVENOUS
  Filled 2016-11-22: qty 2

## 2016-11-22 MED ORDER — IPRATROPIUM BROMIDE HFA 17 MCG/ACT IN AERS
INHALATION_SPRAY | RESPIRATORY_TRACT | Status: DC | PRN
  Administered 2016-11-22 (×2): 2 via RESPIRATORY_TRACT

## 2016-11-22 MED ORDER — MIDAZOLAM HCL 5 MG/5ML IJ SOLN
INTRAMUSCULAR | Status: DC | PRN
  Administered 2016-11-22: 2 mg via INTRAVENOUS

## 2016-11-22 MED ORDER — IBUPROFEN 200 MG PO TABS: 600.0000 mg | ORAL_TABLET | Freq: Four times a day (QID) | ORAL | Status: DC | PRN

## 2016-11-22 MED ORDER — LIDOCAINE 2% (20 MG/ML) 5 ML SYRINGE
INTRAMUSCULAR | Status: AC
  Filled 2016-11-22: qty 5

## 2016-11-22 MED ORDER — HYDROMORPHONE HCL 1 MG/ML IJ SOLN
0.2500 mg | INTRAMUSCULAR | Status: DC | PRN
  Administered 2016-11-22: 0.5 mg via INTRAVENOUS
  Administered 2016-11-22 (×2): 0.25 mg via INTRAVENOUS
  Administered 2016-11-22 (×2): 0.5 mg via INTRAVENOUS

## 2016-11-22 MED ORDER — FENTANYL CITRATE (PF) 100 MCG/2ML IJ SOLN: 100.0000 ug | INTRAMUSCULAR | Status: DC | PRN

## 2016-11-22 MED ORDER — LACTATED RINGERS IV SOLN
INTRAVENOUS | Status: DC | PRN
  Administered 2016-11-22: 09:00:00 via INTRAVENOUS

## 2016-11-22 MED ORDER — LACTATED RINGERS IV SOLN
INTRAVENOUS | Status: DC
  Administered 2016-11-22: 11:00:00 via INTRAVENOUS

## 2016-11-22 MED ORDER — MIDAZOLAM HCL 2 MG/2ML IJ SOLN
INTRAMUSCULAR | Status: AC
  Filled 2016-11-22: qty 2

## 2016-11-22 MED ORDER — METRONIDAZOLE 500 MG PO TABS
500.0000 mg | ORAL_TABLET | Freq: Three times a day (TID) | ORAL | Status: DC
  Administered 2016-11-22 – 2016-11-23 (×3): 500 mg via ORAL
  Filled 2016-11-22 (×3): qty 1

## 2016-11-22 MED ORDER — PROPOFOL 10 MG/ML IV BOLUS
INTRAVENOUS | Status: AC
  Filled 2016-11-22: qty 20

## 2016-11-22 MED ORDER — BECLOMETHASONE DIPROPIONATE 80 MCG/ACT IN AERS: 1.0000 | INHALATION_SPRAY | Freq: Two times a day (BID) | RESPIRATORY_TRACT | Status: DC

## 2016-11-22 MED ORDER — CIPROFLOXACIN IN D5W 400 MG/200ML IV SOLN
400.0000 mg | Freq: Two times a day (BID) | INTRAVENOUS | Status: DC
  Administered 2016-11-22 – 2016-11-23 (×3): 400 mg via INTRAVENOUS
  Filled 2016-11-22 (×3): qty 200

## 2016-11-22 MED ORDER — HYDROCODONE-ACETAMINOPHEN 5-325 MG PO TABS
1.0000 | ORAL_TABLET | ORAL | Status: DC | PRN
  Administered 2016-11-22: 2 via ORAL
  Administered 2016-11-23: 1 via ORAL
  Administered 2016-11-23: 2 via ORAL
  Filled 2016-11-22 (×3): qty 2
  Filled 2016-11-22: qty 1

## 2016-11-22 MED ORDER — PANTOPRAZOLE SODIUM 40 MG PO TBEC
40.0000 mg | DELAYED_RELEASE_TABLET | Freq: Every day | ORAL | Status: DC
  Administered 2016-11-22: 40 mg via ORAL
  Filled 2016-11-22 (×2): qty 1

## 2016-11-22 MED ORDER — MAGNESIUM SULFATE 2 GM/50ML IV SOLN
INTRAVENOUS | Status: AC
  Administered 2016-11-22: 2 g
  Filled 2016-11-22: qty 50

## 2016-11-22 MED ORDER — MOMETASONE FURO-FORMOTEROL FUM 200-5 MCG/ACT IN AERO
2.0000 | INHALATION_SPRAY | Freq: Two times a day (BID) | RESPIRATORY_TRACT | Status: DC
  Administered 2016-11-22 – 2016-11-23 (×2): 2 via RESPIRATORY_TRACT
  Filled 2016-11-22: qty 8.8

## 2016-11-22 MED ORDER — ENOXAPARIN SODIUM 40 MG/0.4ML ~~LOC~~ SOLN
40.0000 mg | SUBCUTANEOUS | Status: DC
  Filled 2016-11-22: qty 0.4

## 2016-11-22 MED ORDER — PROPOFOL 10 MG/ML IV BOLUS
INTRAVENOUS | Status: DC | PRN
  Administered 2016-11-22: 180 mg via INTRAVENOUS

## 2016-11-22 MED ORDER — SODIUM CHLORIDE 0.9 % IV BOLUS (SEPSIS)
1000.0000 mL | Freq: Once | INTRAVENOUS | Status: AC
  Administered 2016-11-22: 1000 mL via INTRAVENOUS

## 2016-11-22 MED ORDER — MORPHINE SULFATE (PF) 2 MG/ML IV SOLN
2.0000 mg | INTRAVENOUS | Status: DC | PRN
  Administered 2016-11-22: 4 mg via INTRAVENOUS
  Filled 2016-11-22: qty 2

## 2016-11-22 MED ORDER — FENTANYL CITRATE (PF) 100 MCG/2ML IJ SOLN
INTRAMUSCULAR | Status: DC | PRN
  Administered 2016-11-22 (×2): 50 ug via INTRAVENOUS

## 2016-11-22 MED ORDER — GABAPENTIN 400 MG PO CAPS
400.0000 mg | ORAL_CAPSULE | Freq: Four times a day (QID) | ORAL | Status: DC
  Administered 2016-11-22 – 2016-11-23 (×4): 400 mg via ORAL
  Filled 2016-11-22 (×4): qty 1

## 2016-11-22 MED ORDER — DIPHENHYDRAMINE HCL 25 MG PO CAPS
25.0000 mg | ORAL_CAPSULE | Freq: Four times a day (QID) | ORAL | Status: DC | PRN
  Administered 2016-11-22: 25 mg via ORAL
  Filled 2016-11-22: qty 1

## 2016-11-22 MED ORDER — FLUOXETINE HCL 20 MG PO CAPS
20.0000 mg | ORAL_CAPSULE | Freq: Every day | ORAL | Status: DC
  Administered 2016-11-23: 20 mg via ORAL
  Filled 2016-11-22: qty 1

## 2016-11-22 MED ORDER — LIDOCAINE 2% (20 MG/ML) 5 ML SYRINGE
INTRAMUSCULAR | Status: DC | PRN
  Administered 2016-11-22: 100 mg via INTRAVENOUS

## 2016-11-22 MED ORDER — LIP MEDEX EX OINT
TOPICAL_OINTMENT | CUTANEOUS | Status: AC
  Administered 2016-11-22: 12:00:00
  Filled 2016-11-22: qty 7

## 2016-11-22 MED ORDER — ONDANSETRON HCL 4 MG/2ML IJ SOLN
INTRAMUSCULAR | Status: DC | PRN
  Administered 2016-11-22: 4 mg via INTRAVENOUS

## 2016-11-22 MED ORDER — KCL IN DEXTROSE-NACL 20-5-0.45 MEQ/L-%-% IV SOLN
INTRAVENOUS | Status: DC
  Administered 2016-11-22: 06:00:00 via INTRAVENOUS
  Filled 2016-11-22 (×2): qty 1000

## 2016-11-22 MED ORDER — ALBUTEROL SULFATE (2.5 MG/3ML) 0.083% IN NEBU
2.5000 mg | INHALATION_SOLUTION | Freq: Four times a day (QID) | RESPIRATORY_TRACT | Status: DC
  Filled 2016-11-22: qty 3

## 2016-11-22 MED ORDER — MONTELUKAST SODIUM 10 MG PO TABS
10.0000 mg | ORAL_TABLET | Freq: Every day | ORAL | Status: DC
  Administered 2016-11-22: 10 mg via ORAL
  Filled 2016-11-22: qty 1

## 2016-11-22 MED ORDER — IOPAMIDOL (ISOVUE-300) INJECTION 61%
100.0000 mL | Freq: Once | INTRAVENOUS | Status: AC | PRN
  Administered 2016-11-22: 100 mL via INTRAVENOUS

## 2016-11-22 MED ORDER — ONDANSETRON 4 MG PO TBDP: 4.0000 mg | ORAL_TABLET | Freq: Four times a day (QID) | ORAL | Status: DC | PRN

## 2016-11-22 MED ORDER — CHLORDIAZEPOXIDE HCL 25 MG PO CAPS
25.0000 mg | ORAL_CAPSULE | Freq: Three times a day (TID) | ORAL | Status: DC
  Administered 2016-11-22: 25 mg via ORAL
  Filled 2016-11-22 (×2): qty 1

## 2016-11-22 MED ORDER — 0.9 % SODIUM CHLORIDE (POUR BTL) OPTIME
TOPICAL | Status: DC | PRN
  Administered 2016-11-22: 1000 mL

## 2016-11-22 MED ORDER — DEXAMETHASONE SODIUM PHOSPHATE 10 MG/ML IJ SOLN
INTRAMUSCULAR | Status: AC
  Filled 2016-11-22: qty 1

## 2016-11-22 MED ORDER — ONDANSETRON HCL 4 MG/2ML IJ SOLN
INTRAMUSCULAR | Status: AC
  Filled 2016-11-22: qty 2

## 2016-11-22 MED ORDER — MIRTAZAPINE 15 MG PO TABS
45.0000 mg | ORAL_TABLET | Freq: Every day | ORAL | Status: DC
Start: 2016-11-22 — End: 2016-11-23
  Administered 2016-11-22: 45 mg via ORAL
  Filled 2016-11-22: qty 3

## 2016-11-22 MED ORDER — DEXAMETHASONE SODIUM PHOSPHATE 10 MG/ML IJ SOLN
INTRAMUSCULAR | Status: DC | PRN
  Administered 2016-11-22: 10 mg via INTRAVENOUS

## 2016-11-22 MED ORDER — POTASSIUM CHLORIDE CRYS ER 20 MEQ PO TBCR
40.0000 meq | EXTENDED_RELEASE_TABLET | Freq: Once | ORAL | Status: AC
  Administered 2016-11-22: 40 meq via ORAL
  Filled 2016-11-22: qty 2

## 2016-11-22 SURGICAL SUPPLY — 24 items
BLADE SURG 15 STRL LF DISP TIS (BLADE) ×1 IMPLANT
BLADE SURG 15 STRL SS (BLADE) ×2
BNDG GAUZE ELAST 4 BULKY (GAUZE/BANDAGES/DRESSINGS) ×1 IMPLANT
BRIEF STRETCH FOR OB PAD LRG (UNDERPADS AND DIAPERS) ×1 IMPLANT
COVER SURGICAL LIGHT HANDLE (MISCELLANEOUS) ×2 IMPLANT
DRSG PAD ABDOMINAL 8X10 ST (GAUZE/BANDAGES/DRESSINGS) ×1 IMPLANT
ELECT PENCIL ROCKER SW 15FT (MISCELLANEOUS) ×2 IMPLANT
ELECT REM PT RETURN 9FT ADLT (ELECTROSURGICAL) ×2
ELECTRODE REM PT RTRN 9FT ADLT (ELECTROSURGICAL) ×1 IMPLANT
GAUZE SPONGE 4X4 12PLY STRL (GAUZE/BANDAGES/DRESSINGS) ×2 IMPLANT
GLOVE SURG SIGNA 7.5 PF LTX (GLOVE) ×2 IMPLANT
GOWN STRL REUS W/TWL XL LVL3 (GOWN DISPOSABLE) ×4 IMPLANT
KIT BASIN OR (CUSTOM PROCEDURE TRAY) ×2 IMPLANT
NEEDLE HYPO 22GX1.5 SAFETY (NEEDLE) IMPLANT
PACK LITHOTOMY IV (CUSTOM PROCEDURE TRAY) ×2 IMPLANT
PAD ABD 8X10 STRL (GAUZE/BANDAGES/DRESSINGS) ×1 IMPLANT
SOL PREP POV-IOD 16OZ 10% (MISCELLANEOUS) IMPLANT
SPONGE LAP 18X18 X RAY DECT (DISPOSABLE) ×2 IMPLANT
SWAB COLLECTION DEVICE MRSA (MISCELLANEOUS) IMPLANT
SYR CONTROL 10ML LL (SYRINGE) IMPLANT
TOWEL OR 17X26 10 PK STRL BLUE (TOWEL DISPOSABLE) ×2 IMPLANT
TOWEL OR NON WOVEN STRL DISP B (DISPOSABLE) ×2 IMPLANT
UNDERPAD 30X30 INCONTINENT (UNDERPADS AND DIAPERS) ×2 IMPLANT
YANKAUER SUCT BULB TIP 10FT TU (MISCELLANEOUS) ×2 IMPLANT

## 2016-11-22 NOTE — Op Note (Signed)
11/21/2016 - 11/22/2016  10:13 AM  PATIENT:  Kelsey NoseJennifer L Keen, 35 y.o., female, MRN: [id number]  PREOP DIAGNOSIS:  perianal abcess  POSTOP DIAGNOSIS:   Anterior perianal abscess (horseshoe like)  PROCEDURE:   Procedure(s): IRRIGATION AND DEBRIDEMENT PERIRECTAL ABSCESS  (incisions in the right anterior and left anterior perianal area)  SURGEON:   Ovidio Kinavid Claudina Oliphant, M.D.  ASSISTANT:   None  ANESTHESIA:   general  Anesthesiologist: Heather RobertsJames Singer, MD CRNA: Wynonia SoursKaren L Walker, CRNA  General  EBL:  75  ml  BLOOD ADMINISTERED: none  DRAINS: none   LOCAL MEDICATIONS USED:   None  SPECIMEN:   None  COUNTS CORRECT:  YES  INDICATIONS FOR PROCEDURE:  Kelsey NoseJennifer L Keen is a 35 y.o. (DOB: September 01, 1981) white female whose primary care physician is PROVIDER NOT IN SYSTEM and comes for I&D of anterior perianal abscess.   The indications and risks of the surgery were explained to the patient.  The risks include, but are not limited to, infection, bleeding, and nerve injury.  PROCEDURE: The patient was taken to room #4 and underwent a general anesthesia. She was placed in lithotomy.  A timeout was held and surgical checklist run.  Her buttocks and perineum were prepped with Betadine. I made an incision at the 10:00 (right anterior) and 2:00 (left anterior) positions and got into an anterior horseshoe like perianal abscess. I irrigated the abscess with 500 mL of saline. I placed damp gauze in the abscess wounds.  Her buttocks was and sterilely dressed and she was transferred to the recovery room in good condition.  Ovidio Kinavid Nastasia Kage, MD, [facility] Pager: [phone] Office phone:  [phone]

## 2016-11-22 NOTE — Anesthesia Procedure Notes (Signed)
Procedure Name: LMA Insertion Date/Time: 11/22/2016 9:51 AM Performed by: Kym GroomWALKER, Kelsey [name] Checklist: Patient identified, Emergency Drugs available, Suction available, Patient being monitored and Timeout performed Patient Re-evaluated:Patient Re-evaluated prior to inductionOxygen Delivery Method: Circle system utilized Preoxygenation: Pre-oxygenation with 100% oxygen Intubation Type: IV induction Ventilation: Mask ventilation without difficulty LMA: LMA inserted LMA Size: 4.0 Placement Confirmation: positive ETCO2 Tube secured with: Tape Dental Injury: Teeth and Oropharynx as per pre-operative assessment

## 2016-11-22 NOTE — H&P (Signed)
Kelsey [name] is an 35 y.o. female.   Chief Complaint: pain HPI: 35 yo F who presents to [facility] with worsening perianal pain.  She has had diarrhea recently and has been treated for some perianal cellulitis by her PCP starting yesterday.  She reports pain for ~5 days.  Past Medical History:  Diagnosis Date  . Anxiety   . Asthma   . Chronic bronchitis ([place])    "get it pretty much q yr" (12/13/2015)  . Depression   . Depression   . GERD (gastroesophageal reflux disease)   . Insomnia   . Palpitations   . Pneumonia    "multiple times when I was a child; once as an adult so far" (12/13/2015)  . Pre-syncope   . PVC (premature ventricular contraction)   . Seasonal allergies   . Tachycardia     Past Surgical History:  Procedure Laterality Date  . CESAREAN SECTION  10/15/2009, 02/08/2006    Family History  Problem Relation Age of Onset  . Heart disease Father   . Rheumatologic disease Mother   . Lung cancer Paternal Grandmother    Social History:  reports that she has quit smoking. Her smoking use included Cigarettes. She has never used smokeless tobacco. She reports that she drinks alcohol. She reports that she uses drugs, including Marijuana.  Allergies:  Allergies  Allergen Reactions  . Penicillins Rash    Has patient had a PCN reaction causing immediate rash, facial/tongue/throat swelling, SOB or lightheadedness with hypotension: No Has patient had a PCN reaction causing severe rash involving mucus membranes or skin necrosis: No Has patient had a PCN reaction that required hospitalization No Has patient had a PCN reaction occurring within the last 10 years: No If all of the above answers are "NO", then may proceed with Cephalosporin use.     (Not in a hospital admission)  Results for orders placed or performed during the hospital encounter of 11/21/16 (from the past 48 hour(s))  Basic metabolic panel     Status: Abnormal   Collection Time: 11/22/16 12:30 AM  Result  Value Ref Range   Sodium 132 (L) 135 - 145 mmol/L   Potassium 2.6 (LL) 3.5 - 5.1 mmol/L    Comment: CRITICAL RESULT CALLED TO, READ BACK BY AND VERIFIED WITH: NEAL,K AT 0055 ON [id number] BY CHERESNOWSKY,T    Chloride 99 (L) 101 - 111 mmol/L   CO2 26 22 - 32 mmol/L   Glucose, Bld 106 (H) 65 - 99 mg/dL   BUN 10 6 - 20 mg/dL   Creatinine, Ser 0.61 0.44 - 1.00 mg/dL   Calcium 8.1 (L) 8.9 - 10.3 mg/dL   GFR calc non Af Amer >60 >60 mL/min   GFR calc Af Amer >60 >60 mL/min    Comment: (NOTE) The eGFR has been calculated using the CKD EPI equation. This calculation has not been validated in all clinical situations. eGFR's persistently <60 mL/min signify possible Chronic Kidney Disease.    Anion gap 7 5 - 15  hCG, quantitative, pregnancy     Status: None   Collection Time: 11/22/16 12:30 AM  Result Value Ref Range   hCG, Beta Chain, Quant, S <1 <5 mIU/mL    Comment:          GEST. AGE      CONC.  (mIU/mL)   <=1 WEEK        5 - 50     2 WEEKS       50 -  500     3 WEEKS       100 - 10,000     4 WEEKS     1,000 - 30,000     5 WEEKS     3,500 - 115,000   6-8 WEEKS     12,000 - 270,000    12 WEEKS     15,000 - 220,000        FEMALE AND NON-PREGNANT FEMALE:     LESS THAN 5 mIU/mL    Ct Pelvis W Contrast  Result Date: 11/22/2016 CLINICAL DATA:  Perianal cellulitis and pain x5 days. Started antibiotics today without relief. EXAM: CT PELVIS WITH CONTRAST TECHNIQUE: Multidetector CT imaging of the pelvis was performed using the standard protocol following the bolus administration of intravenous contrast. CONTRAST:  128m ISOVUE-300 IOPAMIDOL (ISOVUE-300) INJECTION 61% COMPARISON:  CT from 06/30/2016 FINDINGS: Urinary Tract:  Unremarkable appearance of the bladder. Bowel:  Unremarkable. Vascular/Lymphatic: No pathologically enlarged lymph nodes. No significant vascular abnormality seen. Reproductive: Intrauterine device noted within the uterus. No adnexal mass. Corpus luteum noted of the left  ovary. Other: Bilobed perianal fluid collection with enhancement consistent with perianal abscess, the largest component is on the left and overall measuring 3.3 cm AP x 3.1 cm transverse by 5 cm craniocaudad. Musculoskeletal: No acute abnormality. IMPRESSION: Bilobed perianal abscess measuring 3.3 x 3.1 x 5 cm. Involuting follicle/cyst or corpus luteum on the left. Intrauterine device noted along the expected course of the endometrial cavity. Electronically Signed   By: DAshley RoyaltyM.D.   On: 11/22/2016 02:22    Review of Systems  Constitutional: Negative for chills and fever.  HENT: Negative for congestion and hearing loss.   Eyes: Negative for blurred vision and double vision.  Respiratory: Negative for cough and sputum production.   Cardiovascular: Negative for chest pain and palpitations.  Gastrointestinal: Positive for diarrhea. Negative for abdominal pain, constipation, nausea and vomiting.  Genitourinary: Negative for dysuria, frequency and urgency.  Skin: Negative for itching and rash.    Blood pressure 102/63, pulse 96, temperature 98.4 F (36.9 C), resp. rate 18, height _0  (1.626 m), weight 77.1 kg (170 lb), SpO2 95 %. Physical Exam  Constitutional: She is oriented to person, place, and time. She appears well-developed and well-nourished.  HENT:  Head: Normocephalic and atraumatic.  Eyes: Conjunctivae and EOM are normal. Pupils are equal, round, and reactive to light.  Neck: Normal range of motion. Neck supple.  Cardiovascular: Normal rate and regular rhythm.   Respiratory: Effort normal and breath sounds normal. No respiratory distress.  GI: Soft. She exhibits no distension. There is no tenderness.  Musculoskeletal: Normal range of motion.  Neurological: She is alert and oriented to person, place, and time.  Skin:  Area of induration and cellulitis extending perianally towards vulva     Assessment/Plan 35yo F with perianal abscess and cellulitis. CT reviewed.  [facility]  Cont IV antibiotics.  Plan for I&D later today.    TRosario Adie, MD 148/47/2072 2:51 AM

## 2016-11-22 NOTE — Anesthesia Preprocedure Evaluation (Addendum)
Anesthesia Evaluation  Patient identified by MRN, date of birth, ID band Patient awake    Reviewed: Allergy & Precautions, H&P , NPO status , Patient's Chart, lab work & pertinent test results  Airway Mallampati: III  TM Distance: >3 FB Neck ROM: Full    Dental no notable dental hx. (+) Teeth Intact, Dental Advisory Given   Pulmonary asthma , former smoker,    Pulmonary exam normal breath sounds clear to auscultation       Cardiovascular negative cardio ROS   Rhythm:Regular Rate:Normal     Neuro/Psych Anxiety Depression negative neurological ROS     GI/Hepatic Neg liver ROS, GERD  Medicated and Controlled,  Endo/Other  negative endocrine ROS  Renal/GU negative Renal ROS  negative genitourinary   Musculoskeletal   Abdominal   Peds  Hematology negative hematology ROS (+)   Anesthesia Other Findings   Reproductive/Obstetrics negative OB ROS                            Anesthesia Physical Anesthesia Plan  ASA: II  Anesthesia Plan: General   Post-op Pain Management:    Induction: Intravenous  Airway Management Planned: LMA  Additional Equipment:   Intra-op Plan:   Post-operative Plan: Extubation in OR  Informed Consent: I have reviewed the patients History and Physical, chart, labs and discussed the procedure including the risks, benefits and alternatives for the proposed anesthesia with the patient or authorized representative who has indicated his/her understanding and acceptance.   Dental advisory given  Plan Discussed with: CRNA  Anesthesia Plan Comments:         Anesthesia Quick Evaluation

## 2016-11-22 NOTE — Anesthesia Postprocedure Evaluation (Signed)
Anesthesia Post Note  Patient: Chrystie NoseJennifer L Keen  Procedure(s) Performed: Procedure(s) (LRB): IRRIGATION AND DEBRIDEMENT PERIRECTAL ABSCESS (Bilateral)  Patient location during evaluation: PACU Anesthesia Type: General Level of consciousness: sedated Pain management: pain level controlled Vital Signs Assessment: post-procedure vital signs reviewed and stable Respiratory status: spontaneous breathing and respiratory function stable Cardiovascular status: stable Anesthetic complications: no       Last Vitals:  Vitals:   11/22/16 1120 11/22/16 1131  BP: 113/79 105/62  Pulse: (!) 103 98  Resp: (!) 21 (!) 21  Temp: 36.8 C 37.1 C    Last Pain:  Vitals:   11/22/16 1120  TempSrc:   PainSc: 2                  Shya Kovatch DANIEL

## 2016-11-22 NOTE — Progress Notes (Signed)
[facility].  This is her first episode of perianal abscess.  Her primary pain in right anterior - CT scan describes "bilobed" abscess with the largerst component on the left.  Will plan I&D this AM.  Discussed indications and risks of surgery with patient.  The primary risks are bleeding, nerve injury, and recurrence of abscess.  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  She is married.  Has 2 children.  Ovidio Kinavid Calyx Hawker, MD, [facility] Pager: [phone] Office phone:  [phone]

## 2016-11-22 NOTE — Telephone Encounter (Signed)
Pt is calling to let wiseman to know that she had surgery this morning and had two abcesses   Best number [phone]

## 2016-11-22 NOTE — Telephone Encounter (Signed)
Pt contacted about how she was doing as I did not see her on my schedule this morning. She informed me she developed chills and fever last night and therefore went to the ER. She was being sent to the OR as we were on the phone.

## 2016-11-22 NOTE — Transfer of Care (Signed)
Immediate Anesthesia Transfer of Care Note  Patient: Kelsey [name]  Procedure(s) Performed: Procedure(s): IRRIGATION AND DEBRIDEMENT PERIRECTAL ABSCESS (Bilateral)  Patient Location: PACU  Anesthesia Type:General  Level of Consciousness:  sedated, patient cooperative and responds to stimulation  Airway & Oxygen Therapy:Patient Spontanous Breathing and Patient connected to face mask oxgen  Post-op Assessment:  Report given to PACU RN and Post -op Vital signs reviewed and stable  Post vital signs:  Reviewed and stable  Last Vitals:  Vitals:   11/22/16 0239 11/22/16 0524  BP: 102/63 (!) 99/52  Pulse: 96 (!) 105  Resp: 18 20  Temp:  36.9 C    Complications: No apparent anesthesia complications

## 2016-11-23 MED ORDER — CIPROFLOXACIN HCL 500 MG PO TABS: 500.0000 mg | ORAL_TABLET | Freq: Two times a day (BID) | ORAL | 0 refills | Status: DC

## 2016-11-23 MED ORDER — HYDROCODONE-ACETAMINOPHEN 5-325 MG PO TABS: 1.0000 | ORAL_TABLET | ORAL | 0 refills | Status: DC | PRN

## 2016-11-23 MED ORDER — METRONIDAZOLE 500 MG PO TABS: 500.0000 mg | ORAL_TABLET | Freq: Three times a day (TID) | ORAL | 0 refills | Status: DC

## 2016-11-23 NOTE — Telephone Encounter (Signed)
Pt contacted. She is doing well after surgery.

## 2016-11-23 NOTE — Discharge Instructions (Signed)
Disposable Sitz Bath °Introduction °A disposable sitz bath is a plastic basin that fits over the toilet. A bag is hung above the toilet, and the bag is connected to a tube that opens into the basin. The bag is filled with warm water that flows into the basin through the tube. A sitz bath can be used to help relieve symptoms, clean, and promote healing in the genital and anal areas, as well as in the lower abdomen and buttocks. °What are the risks? °Sitz baths are generally very safe. It is possible for the skin between the genitals and the anus (perineum) to become infected, but this is rare. You can avoid this by cleaning your sitz bath supplies thoroughly. °How to use a disposable sitz bath °1. Close the clamp on the tube. Make sure the clamp is closed tightly to prevent leakage. °2. Fill the sitz bath basin and the plastic bag with warm water. The water should be warm enough to be comfortable, but not hot. °3. Raise the toilet seat and place the filled basin on the toilet. Make sure the overflow opening is facing toward the back of the toilet. °¨ If you prefer, you may place the empty basin on the toilet first, and then use the plastic bag to fill the basin with warm water. °4. Hang the filled plastic bag overhead on a hook or towel rack close to the toilet. The bag should be higher than the toilet so that the water will flow down through the tube. °5. Attach the tube to the opening on the basin. Make sure that the tube is attached to the basin tightly to prevent leakage. °6. Sit on the basin and release the clamp. This will allow warm water to flow into the basin and flush the area around your genitals and anus. °7. Remain sitting on the basin for about 15-20 minutes, or as long as told by your health care provider. °8. Stand up and gently pat your skin dry. If directed, apply clean bandages (dressings) to the affected area as told by your health care provider. °9. Carefully remove the basin from the toilet seat  and tip the basin into the toilet to empty any remaining water. Empty any remaining water from the plastic bag into the toilet. Then, flush the toilet. °10. Wash the basin with warm water and soap. Let the basin air dry in the sink. You should also let the plastic bag and the tubing air dry. °11. Store the basin, tubing, and plastic bag in a clean, dry area. °12. Wash your hands with soap and water. If soap and water are not available, use hand sanitizer. °Contact a health care provider if: °· You have symptoms that get worse instead of better. °· You develop new skin irritation, redness, or swelling around your genitals or anus. °This information is not intended to replace advice given to you by your health care provider. Make sure you discuss any questions you have with your health care provider. °Document Released: 05/14/2012 Document Revised: 04/20/2016 Document Reviewed: 10/03/2015 °© 2017 Elsevier ° °

## 2016-11-23 NOTE — Discharge Summary (Signed)
[facility] Discharge Summary   Patient ID: Kelsey NoseJennifer L Keen MRN: [id number] DOB/AGE: 01-02-1981 35 y.o.  Admit date: 11/21/2016 Discharge date: 11/23/2016  Admitting Diagnosis: Perianal abscess  Discharge Diagnosis Patient Active Problem List   Diagnosis Date Noted  . Perianal abscess 11/22/2016  . Asthma 10/15/2015  . Allergic rhinitis 10/15/2015  . Alcohol abuse 07/14/2015  . GAD (generalized anxiety disorder) 07/14/2015  . Depression, major, recurrent, moderate (HCC) 07/14/2015  . Periscapular pain 01/19/2015  . Left-sided thoracic back pain-greater than 20 years duration 01/12/2015  . GERD (gastroesophageal reflux disease) 08/22/2013  . 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 Abnormal ECG- T  ST changes in V3 through V5 05/02/2011    Consultants None  Imaging: Ct Pelvis W Contrast  Result Date: 11/22/2016 CLINICAL DATA:  Perianal cellulitis and pain x5 days. Started antibiotics today without relief. EXAM: CT PELVIS WITH CONTRAST TECHNIQUE: Multidetector CT imaging of the pelvis was performed using the standard protocol following the bolus administration of intravenous contrast. CONTRAST:  100mL ISOVUE-300 IOPAMIDOL (ISOVUE-300) INJECTION 61% COMPARISON:  CT from 06/30/2016 FINDINGS: Urinary Tract:  Unremarkable appearance of the bladder. Bowel:  Unremarkable. Vascular/Lymphatic: No pathologically enlarged lymph nodes. No significant vascular abnormality seen. Reproductive: Intrauterine device noted within the uterus. No adnexal mass. Corpus luteum noted of the left ovary. Other: Bilobed perianal fluid collection with enhancement consistent with perianal abscess, the largest component is on the left and overall measuring 3.3 cm AP x 3.1 cm transverse by 5 cm craniocaudad. Musculoskeletal: No acute abnormality. IMPRESSION: Bilobed perianal abscess measuring 3.3 x 3.1 x 5 cm. Involuting follicle/cyst or corpus luteum on the left. Intrauterine  device noted along the expected course of the endometrial cavity. Electronically Signed   By: Tollie Ethavid  Kwon M.D.   On: 11/22/2016 02:22    Procedures Dr. Ezzard StandingNewman (11/22/16) - IRRIGATION AND DEBRIDEMENT PERIRECTAL ABSCESS  (incisions in the right anterior and left anterior perianal area)  Hospital Course:  Kelsey NoseJennifer L Keen is a 35yo female who [facility] with worsening perianal pain. Patient had previously been seen by her PCP who started her on antibiotics. Because her pain worsened [facility] scan confirmed a perianal abscess. Patient was transferred here for I&D of the abscess.  Tolerated procedure well and was transferred to the floor.  Diet was advanced as tolerated.  On POD1 the patient was voiding well, tolerating diet, pain well controlled, vital signs stable, wounds clean and felt stable for discharge home.  Patient will follow up with Dr. Ezzard StandingNewman in 2 weeks and knows to call with questions or concerns.  She will be on cipro/flagyl for 1 week.  Physical Exam: Gen:  Alert, NAD, pleasant Pulm: effort normal Abd: Soft, NT/ND Rectum: 2 small incisions perianal area with minimal induration >> packing removed and there is trace bloody drainage  Allergies as of 11/23/2016      Reactions   Penicillins Rash   Has patient had a PCN reaction causing immediate rash, facial/tongue/throat swelling, SOB or lightheadedness with hypotension: No Has patient had a PCN reaction causing severe rash involving mucus membranes or skin necrosis: No Has patient had a PCN reaction that required hospitalization No Has patient had a PCN reaction occurring within the last 10 years: No If all of the above answers are "NO", then may proceed with Cephalosporin use.      Medication List    TAKE these medications   albuterol (2.5 MG/3ML) 0.083% nebulizer  solution Commonly known as:  PROVENTIL Take 3 mLs (2.5 mg total) by nebulization every 4 (four) hours as needed for wheezing or  shortness of breath.   albuterol 108 (90 Base) MCG/ACT inhaler Commonly known as:  PROAIR HFA INHALE 2 PUFFS INTO THE LUNGS EVERY 4 HOURS AS NEEDED (FOR SHORTNESS OF BREATH).   ALPRAZolam 2 MG 24 hr tablet Commonly known as:  XANAX XR Take 1 tablet by mouth daily. What changed:  Another medication with the same name was removed. Continue taking this medication, and follow the directions you see here.   beclomethasone 80 MCG/ACT inhaler Commonly known as:  QVAR Inhale 1 puff into the lungs 2 (two) times daily.   budesonide-formoterol 160-4.5 MCG/ACT inhaler Commonly known as:  SYMBICORT Inhale 2 puffs into the lungs 2 (two) times daily.   ciprofloxacin 500 MG tablet Commonly known as:  CIPRO Take 1 tablet (500 mg total) by mouth 2 (two) times daily. What changed:  when to take this   esomeprazole 20 MG packet Commonly known as:  NEXIUM Take 20 mg by mouth daily before breakfast.   FLUoxetine 20 MG capsule Commonly known as:  PROZAC Take 1 capsule by mouth daily.   gabapentin 400 MG capsule Commonly known as:  NEURONTIN Take 400 mg by mouth 3 (three) times daily.   HYDROcodone-acetaminophen 5-325 MG tablet Commonly known as:  NORCO/VICODIN Take 1-2 tablets by mouth every 4 (four) hours as needed for moderate pain. What changed:  how much to take  when to take this  reasons to take this   levonorgestrel 20 MCG/24HR IUD Commonly known as:  MIRENA 1 Intra Uterine Device (1 each total) by Intrauterine route once.   metroNIDAZOLE 500 MG tablet Commonly known as:  FLAGYL Take 1 tablet (500 mg total) by mouth every 8 (eight) hours. What changed:  when to take this  additional instructions   mirtazapine 45 MG tablet Commonly known as:  REMERON Take 1 tablet (45 mg total) by mouth at bedtime.   montelukast 10 MG tablet Commonly known as:  SINGULAIR Take 1 tablet (10 mg total) by mouth at bedtime.   ondansetron 4 MG tablet Commonly known as:  ZOFRAN Take 1  tablet (4 mg total) by mouth every 8 (eight) hours as needed for nausea or vomiting.        Follow-up Information    Robynne Roat H, MD. Schedule an appointment as soon as possible for a visit in 2 week(s).   Specialty:  General Surgery Contact information: [address] [place] [address] [phone]           Signed: Edson SnowballBROOKE A MILLER, [facility] 11/23/2016, 1:47 PM Pager: [phone] Consults: [phone] Mon-Fri 7:00 am-4:30 pm Sat-Sun 7:00 am-11:30 am  Agree with above.  Ovidio Kinavid Ryheem Jay, MD, [facility] Pager: [phone] Office phone:  [phone]

## 2016-11-23 NOTE — Progress Notes (Signed)
Patient ID: Kelsey [name], female   DOB: Feb 28, 1981, 35 y.o.   MRN: [id number]  [facility]  1 Day Post-Op  Subjective: Feeling better this morning than prior to surgery. Wants to go home today. Pain is well controlled on Norco. Tolerating diet.  Objective: Vital signs in last 24 hours: Temp:  [97.7 F (36.5 C)-98.7 F (37.1 C)] 97.7 F (36.5 C) (12/28 0621) Pulse Rate:  [86-105] 95 (12/28 0628) Resp:  [10-21] 16 (12/28 0628) BP: (94-124)/(60-87) 101/60 (12/28 0621) SpO2:  [90 %-100 %] 94 % (12/28 0931) Last BM Date: 11/22/16  Intake/Output from previous day: 12/27 0701 - 12/28 0700 In: 2907.5 [P.O.:840; I.V.:2067.5] Out: -  Intake/Output this shift: No intake/output data recorded.  PE: Gen:  Alert, NAD, pleasant Pulm: effort normal Abd: Soft, NT/ND Rectum: 2 small incisions perianal area with minimal induration >> packing removed and there is trace bloody drainage  Lab Results:   Recent Labs  11/21/16 1658 11/22/16 0616  WBC 14.1* 15.7*  HGB 12.5 11.8*  HCT 35.2* 34.6*  PLT  --  290   BMET  Recent Labs  11/22/16 0030 11/22/16 0616  NA 132* 136  K 2.6* 3.1*  CL 99* 102  CO2 26 26  GLUCOSE 106* 101*  BUN 10 8  CREATININE 0.61 0.58  CALCIUM 8.1* 7.6*   PT/INR No results for input(s): LABPROT, INR in the last 72 hours. CMP     Component Value Date/Time   NA 136 11/22/2016 0616   K 3.1 (L) 11/22/2016 0616   CL 102 11/22/2016 0616   CO2 26 11/22/2016 0616   GLUCOSE 101 (H) 11/22/2016 0616   BUN 8 11/22/2016 0616   CREATININE 0.58 11/22/2016 0616   CALCIUM 7.6 (L) 11/22/2016 0616   PROT 8.0 07/13/2015 2218   ALBUMIN 4.9 07/13/2015 2218   AST 28 07/13/2015 2218   ALT 23 07/13/2015 2218   ALKPHOS 72 07/13/2015 2218   BILITOT 0.9 07/13/2015 2218   GFRNONAA >60 11/22/2016 0616   GFRAA >60 11/22/2016 0616   Lipase     Component Value Date/Time   LIPASE 21 01/17/2015 0733       Studies/Results: Ct Pelvis W  Contrast  Result Date: 11/22/2016 CLINICAL DATA:  Perianal cellulitis and pain x5 days. Started antibiotics today without relief. EXAM: CT PELVIS WITH CONTRAST TECHNIQUE: Multidetector CT imaging of the pelvis was performed using the standard protocol following the bolus administration of intravenous contrast. CONTRAST:  100mL ISOVUE-300 IOPAMIDOL (ISOVUE-300) INJECTION 61% COMPARISON:  CT from 06/30/2016 FINDINGS: Urinary Tract:  Unremarkable appearance of the bladder. Bowel:  Unremarkable. Vascular/Lymphatic: No pathologically enlarged lymph nodes. No significant vascular abnormality seen. Reproductive: Intrauterine device noted within the uterus. No adnexal mass. Corpus luteum noted of the left ovary. Other: Bilobed perianal fluid collection with enhancement consistent with perianal abscess, the largest component is on the left and overall measuring 3.3 cm AP x 3.1 cm transverse by 5 cm craniocaudad. Musculoskeletal: No acute abnormality. IMPRESSION: Bilobed perianal abscess measuring 3.3 x 3.1 x 5 cm. Involuting follicle/cyst or corpus luteum on the left. Intrauterine device noted along the expected course of the endometrial cavity. Electronically Signed   By: Tollie Ethavid  Kwon M.D.   On: 11/22/2016 02:22    Anti-infectives: Anti-infectives    Start     Dose/Rate Route Frequency Ordered Stop   11/22/16 1400  metroNIDAZOLE (FLAGYL) tablet 500 mg     500 mg Oral Every 8 hours 11/22/16 0540  11/22/16 0600  ciprofloxacin (CIPRO) IVPB 400 mg     400 mg 200 mL/hr over 60 Minutes Intravenous Every 12 hours 11/22/16 0540     11/22/16 0300  metroNIDAZOLE (FLAGYL) tablet 500 mg     500 mg Oral  Once 11/22/16 0252 11/22/16 0316   11/22/16 0245  metroNIDAZOLE (FLAGYL) IVPB 500 mg  Status:  Discontinued     500 mg 100 mL/hr over 60 Minutes Intravenous  Once 11/22/16 0244 11/22/16 0252       Assessment/Plan IRRIGATION AND DEBRIDEMENT PERIRECTAL ABSCESS  (incisions in the right anterior and left anterior  perianal area) 12/27 Dr. Ezzard StandingNewman - POD 1 - afebrile  ID - cipro/flagyl 12/27>> FEN - regular VTE - lovenox  Plan - VSS, pain controlled, patient ready for discharge. I removed the packing from her wounds. Please start sitz baths 2-3x today. She will go home on 1 week of cipro/flagyl, and will follow-up with Dr. Ezzard StandingNewman in 2 weeks.   LOS: 1 day    Edson SnowballBROOKE A MILLER , [facility] 11/23/2016, 10:18 AM Pager: [phone] Consults: [phone] Mon-Fri 7:00 am-4:30 pm Sat-Sun 7:00 am-11:30 am  Agree with above.  Ovidio Kinavid Emil Klassen, MD, [facility] Pager: [phone] Office phone:  [phone]

## 2016-12-26 ENCOUNTER — Other Ambulatory Visit: Payer: Self-pay | Admitting: Pulmonary Disease

## 2017-01-05 DIAGNOSIS — K610 Anal abscess: Secondary | ICD-10-CM

## 2017-01-19 ENCOUNTER — Ambulatory Visit (INDEPENDENT_AMBULATORY_CARE_PROVIDER_SITE_OTHER): Payer: BLUE CROSS/BLUE SHIELD

## 2017-01-19 ENCOUNTER — Ambulatory Visit (INDEPENDENT_AMBULATORY_CARE_PROVIDER_SITE_OTHER): Payer: BLUE CROSS/BLUE SHIELD | Admitting: Physician Assistant

## 2017-01-19 VITALS — BP 112/80 | HR 99 | Resp 18 | Ht 64.0 in | Wt 164.8 lb

## 2017-01-19 DIAGNOSIS — M25532 Pain in left wrist: Secondary | ICD-10-CM

## 2017-01-19 DIAGNOSIS — K602 Anal fissure, unspecified: Secondary | ICD-10-CM

## 2017-01-19 DIAGNOSIS — K649 Unspecified hemorrhoids: Secondary | ICD-10-CM

## 2017-01-19 MED ORDER — DILTIAZEM GEL 2 %: CUTANEOUS | 0 refills | Status: DC

## 2017-01-19 MED ORDER — MELOXICAM 15 MG PO TABS: 15.0000 mg | ORAL_TABLET | Freq: Every day | ORAL | 1 refills | Status: DC

## 2017-01-19 MED ORDER — HYDROCORTISONE 2.5 % RE CREA: 1.0000 "application " | TOPICAL_CREAM | Freq: Two times a day (BID) | RECTAL | 0 refills | Status: DC

## 2017-01-19 NOTE — Patient Instructions (Addendum)
For hemorrhoid, use anusol hc cream twice a day. For anal fissure, use diltiazem 3 times day. Avoid excessive wiping with toilet. You may want to use wet wipes in the meantime until these resolve. If you develop severe pain come back immediately.  For wrist pain, we will treat as wrist sprain. Take meloxicam daily and apply ice 4-5 times a day. Start wrist exercises once your pain is resolving. If you are still having pain in 2 weeks, return as you may need additional images.   About Hemorrhoids  Hemorrhoids are swollen veins in the lower rectum and anus.  Also called piles, hemorrhoids are a common problem.  Hemorrhoids may be internal (inside the rectum) or external (around the anus).  Internal Hemorrhoids  Internal hemorrhoids are often painless, but they rarely cause bleeding.  The internal veins may stretch and fall down (prolapse) through the anus to the outside of the body.  The veins may then become irritated and painful.  External Hemorrhoids  External hemorrhoids can be easily seen or felt around the anal opening.  They are under the skin around the anus.  When the swollen veins are scratched or broken by straining, rubbing or wiping they sometimes bleed.  How Hemorrhoids Occur  Veins in the rectum and around the anus tend to swell under pressure.  Hemorrhoids can result from increased pressure in the veins of your anus or rectum.  Some sources of pressure are:   Straining to have a bowel movement because of constipation  Waiting too long to have a bowel movement  Coughing and sneezing often  Sitting for extended periods of time, including on the toilet  Diarrhea  Obesity  Trauma or injury to the anus  Some liver diseases  Stress  Family history of hemorrhoids  Pregnancy  Pregnant women should try to avoid becoming constipated, because they are more likely to have hemorrhoids during pregnancy.  In the last trimester of pregnancy, the enlarged uterus may press on  blood vessels and causes hemorrhoids.  In addition, the strain of childbirth sometimes causes hemorrhoids after the birth.  Symptoms of Hemorrhoids  Some symptoms of hemorrhoids include:  Swelling and/or a tender lump around the anus  Itching, mild burning and bleeding around the anus  Painful bowel movements with or without constipation  Bright red blood covering the stool, on toilet paper or in the toilet bowel.   Symptoms usually go away within a few days.  Always talk to your doctor about any bleeding to make sure it is not from some other causes.  Diagnosing and Treating Hemorrhoids  Diagnosis is made by an examination by your healthcare provider.  Special test can be performed by your doctor.    Most cases of hemorrhoids can be treated with:  High-fiber diet: Eat more high-fiber foods, which help prevent constipation.  Ask for more detailed fiber information on types and sources of fiber from your healthcare provider.  Fluids: Drink plenty of water.  This helps soften bowel movements so they are easier to pass.  Sitz baths and cold packs: Sitting in lukewarm water two or three times a day for 15 minutes cleases the anal area and may relieve discomfort.  If the water is too hot, swelling around the anus will get worse.  Placing a cloth-covered ice pack on the anus for ten minutes four times a day can also help reduce selling.  Gently pushing a prolapsed hemorrhoid back inside after the bath or ice pack can be helpful.  Medications: For mild discomfort, your healthcare provider may suggest over-the-counter pain medication or prescribe a cream or ointment for topical use.  The cream may contain witch hazel, zinc oxide or petroleum jelly.  Medicated suppositories are also a treatment option.  Always consult your doctor before applying medications or creams.  Procedures and surgeries: There are also a number of procedures and surgeries to shrink or remove hemorrhoids in more serious  cases.  Talk to your physician about these options.  You can often prevent hemorrhoids or keep them from becoming worse by maintaining a healthy lifestyle.  Eat a fiber-rich diet of fruits, vegetables and whole grains.  Also, drink plenty of water and exercise regularly.   2007, [employer] Doc.30   Anal Fissure, Adult Introduction An anal fissure is a small tear or crack in the skin around the opening of the butt (anus).Bleeding from the tear or crack usually stops on its own within a few minutes. The bleeding may happen every time you poop (have a bowel movement) until the tear or crack heals. Follow these instructions at home: Eating and drinking  Avoid bananas and dairy products. These foods can make it hard to poop.  Drink enough fluid to keep your pee (urine) clear or pale yellow.  Eat a lot of fruit, whole grains, and vegetables. General instructions  Keep the butt area as clean and dry as you can.  Take a warm water bath (sitz bath) as told by your doctor. Do not use soap.  Take over-the-counter and prescription medicines only as told by your doctor.  Use creams or ointments only as told by your doctor.  Keep all follow-up visits as told by your doctor. This is important. Contact a doctor if:  You have more bleeding.  You have a fever.  You have watery poop (diarrhea) that is mixed with blood.  You have pain.  You problem gets worse, not better. This information is not intended to replace advice given to you by your health care provider. Make sure you discuss any questions you have with your health care provider. Document Released: 07/12/2011 Document Revised: 04/20/2016 Document Reviewed: 02/08/2015  2017 Elsevier   Wrist Sprain Rehab Ask your health care provider which exercises are safe for you. Do exercises exactly as told by your health care provider and adjust them as directed. It is normal to feel mild stretching, pulling, tightness, or  discomfort as you do these exercises, but you should stop right away if you feel sudden pain or your pain gets worse.Do not begin these exercises until told by your health care provider. Stretching and range of motion exercises These exercises warm up your muscles and joints and improve the movement and flexibility of your wrist. These exercises also help to relieve pain, numbness, and tingling. Exercise A: Wrist flexion, active 12. Extend your left / right arm in front of you, and point your fingers downward. 13. If told by your health care provider, bend your left / right arm. 14. Try to bring your palm toward your forearm as far as you can without pain. You should feel a gentle stretch on the top of your forearm and wrist. 15. Hold this position for __________ seconds. 16. Slowly return to the starting position. Repeat __________ times. Complete this exercise __________ times a day. Exercise B: Wrist extension, active 5. Extend your left / right arm in front of you and turn your palm upward. 6. If told by your health care provider, bend your left /  right arm. 7. Bring your palm and fingertips back so your fingers point downward. You should feel a gentle stretch on the inside of your forearm and wrist. 8. Hold this position for __________ seconds. 9. Slowly return to the starting position. Repeat __________ times. Complete this exercise __________ times a day. Exercise C: Supination, active 6. Stand or sit with your arms at your sides. 7. Bend your left / right elbow to an "L" shape (90 degrees). 8. Turn your palm upward until you feel a gentle stretch in the inside of your forearm. 9. Hold this position for __________ seconds. 10. Slowly return your palm to the starting position. Repeat __________ times. Complete this stretch __________ times a day. Exercise D: Pronation, active 1. Stand or sit with your arms at your sides. 2. Bend your left / right elbow to an "L" shape (90  degrees). 3. Turn your palm downward until you feel a gentle stretch in the top of your forearm. 4. Hold this position for __________ seconds. 5. Slowly return your palm to the starting position. Repeat __________ times. Complete this stretch __________ times a day. Strengthening exercises These exercises build strength and endurance in your wrist. Endurance is the ability to use your muscles for a long time, even after they get tired. Exercise E: Wrist flexors 1. Sit with your left / right forearm supported on a table and your hand resting palm-up over the edge of the table. Your elbow should be below the level of your shoulder. 2. Hold a __________ weightin your left / right hand. Or, hold a rubber exercise band or tube in both hands, keeping your hands at the same level and hip distance apart. There should be a slight tension in the exercise band or tube. 3. Slowly curl your hand up toward your forearm. 4. Hold this position for __________ seconds. 5. Slowly lower your hand back to the starting position. Repeat __________ times. Complete this exercise __________ times a day. Exercise F: Wrist extensors 1. Sit with your left / right forearm supported on a table and your hand resting palm-down over the edge of the table. Your elbow should be below the level of your shoulder. 2. Hold a __________ weight in your left / right hand. Or, hold a rubber exercise band or tube in both hands, keeping your hands at the same level and hip distance apart. There should be a slight tension in the exercise band or tube. 3. Slowly curl your hand up toward your forearm. 4. Hold this position for __________ seconds. 5. Slowly lower your hand to the starting position. Repeat __________ times. Complete this exercise __________ times a day. This information is not intended to replace advice given to you by your health care provider. Make sure you discuss any questions you have with your health care  provider. Document Released: 11/13/2005 Document Revised: 07/19/2016 Document Reviewed: 07/31/2015 Elsevier Interactive Patient Education  2017 [employer]   IF you received an x-ray today, you will receive an invoice from [facility]. Please contact [facility] at [phone] with questions or concerns regarding your invoice.   IF you received labwork today, you will receive an invoice from [facility]. Please contact [facility] at [phone] with questions or concerns regarding your invoice.   Our billing staff will not be able to assist you with questions regarding bills from these companies.  You will be contacted with the lab results as soon as they are available. The fastest way to get your results is to  activate your My Chart account. Instructions are located on the last page of this paperwork. If you have not heard from [place] regarding the results in 2 weeks, please contact this office.
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
## 2017-04-23 ENCOUNTER — Emergency Department (HOSPITAL_BASED_OUTPATIENT_CLINIC_OR_DEPARTMENT_OTHER)
Admission: EM | Admit: 2017-04-23 | Discharge: 2017-04-24 | Disposition: A | Discharge status: Home / Self Care | Payer: BLUE CROSS/BLUE SHIELD | Attending: Emergency Medicine | Admitting: Emergency Medicine

## 2017-04-23 ENCOUNTER — Encounter (HOSPITAL_BASED_OUTPATIENT_CLINIC_OR_DEPARTMENT_OTHER): Payer: Self-pay

## 2017-04-23 DIAGNOSIS — K644 Residual hemorrhoidal skin tags: Secondary | ICD-10-CM | POA: Insufficient documentation

## 2017-04-23 DIAGNOSIS — F17210 Nicotine dependence, cigarettes, uncomplicated: Secondary | ICD-10-CM | POA: Diagnosis not present

## 2017-04-23 DIAGNOSIS — L0231 Cutaneous abscess of buttock: Secondary | ICD-10-CM | POA: Diagnosis not present

## 2017-04-23 DIAGNOSIS — J45909 Unspecified asthma, uncomplicated: Secondary | ICD-10-CM | POA: Insufficient documentation

## 2017-04-23 DIAGNOSIS — Z79899 Other long term (current) drug therapy: Secondary | ICD-10-CM | POA: Diagnosis not present

## 2017-04-23 DIAGNOSIS — R222 Localized swelling, mass and lump, trunk: Secondary | ICD-10-CM | POA: Diagnosis present

## 2017-04-23 LAB — BASIC METABOLIC PANEL
Anion gap: 6 (ref 5–15)
BUN: 14 mg/dL (ref 6–20)
CALCIUM: 8.7 mg/dL — AB (ref 8.9–10.3)
CO2: 26 mmol/L (ref 22–32)
Chloride: 104 mmol/L (ref 101–111)
Creatinine, Ser: 0.62 mg/dL (ref 0.44–1.00)
Glucose, Bld: 95 mg/dL (ref 65–99)
POTASSIUM: 4 mmol/L (ref 3.5–5.1)
Sodium: 136 mmol/L (ref 135–145)

## 2017-04-23 LAB — CBC
HEMATOCRIT: 39.7 % (ref 36.0–46.0)
Hemoglobin: 13 g/dL (ref 12.0–15.0)
MCH: 31.5 pg (ref 26.0–34.0)
MCHC: 32.7 g/dL (ref 30.0–36.0)
MCV: 96.1 fL (ref 78.0–100.0)
PLATELETS: 278 10*3/uL (ref 150–400)
RBC: 4.13 MIL/uL (ref 3.87–5.11)
RDW: 12.4 % (ref 11.5–15.5)
WBC: 6.3 10*3/uL (ref 4.0–10.5)

## 2017-04-23 MED ORDER — LIDOCAINE HCL (PF) 1 % IJ SOLN
5.0000 mL | Freq: Once | INTRAMUSCULAR | Status: AC
  Administered 2017-04-23: 5 mL
  Filled 2017-04-23: qty 5

## 2017-04-23 MED ORDER — SULFAMETHOXAZOLE-TRIMETHOPRIM 800-160 MG PO TABS
1.0000 | ORAL_TABLET | Freq: Once | ORAL | Status: AC
  Administered 2017-04-24: 1 via ORAL
  Filled 2017-04-23: qty 1

## 2017-04-23 MED ORDER — SULFAMETHOXAZOLE-TRIMETHOPRIM 800-160 MG PO TABS: 1.0000 | ORAL_TABLET | Freq: Two times a day (BID) | ORAL | 0 refills | Status: AC

## 2017-04-23 NOTE — ED Triage Notes (Addendum)
C/o abscess to left buttock x this am-also c/o hemorrhoid-NAD-steady gait
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
## 2017-04-23 NOTE — ED Notes (Signed)
Chaperoned PA for I&D of small perirectal abscess.

## 2017-04-23 NOTE — ED Provider Notes (Signed)
[facility] Provider Note   CSN: [id number] Arrival date & time: 04/23/17  2106  By signing my name below, I, Ny'Kea Lewis, attest that this documentation has been prepared under the direction and in the presence of Audry Pili, PA-C. Electronically Signed: Karren Cobble, ED Scribe. 04/23/17. 11:32 PM.  History   Chief Complaint Chief Complaint  Patient presents with  . Abscess   HPI HPI Comments: Kelsey [name] is a 36 y.o. female who presents to the Emergency Department complaining of a moderate, gradually worsening area of pain and swelling to the inner buttock onset this morning. She notes  the presence of this area this morning. Pt states pain is exacerbated with palpation and direct pressure. She previously had two abscesses to the buttock that required her to have emergency surgery in December of 2017. Rates her pain a 3/10 at this time. Notes normal bowel movement. Denies fever, chills, drainage from the area, nausea, vomiting, or abdominal pain.  Pt also notes presence of hemorrhoids, which has been a reoccurring issure. She has been complaint with prescribed medication but notes no improvement in her pain. Denies hematochezia.   Past Medical History:  Diagnosis Date  . Anxiety   . Asthma   . Chronic bronchitis (HCC)    "get it pretty much q yr" (12/13/2015)  . Depression   . Depression   . GERD (gastroesophageal reflux disease)   . Insomnia   . Palpitations   . Pneumonia    "multiple times when I was a child; once as an adult so far" (12/13/2015)  . Pre-syncope   . PVC (premature ventricular contraction)   . Seasonal allergies   . Tachycardia     Patient Active Problem List   Diagnosis Date Noted  . Perianal abscess 11/22/2016  . Asthma 10/15/2015  . Allergic rhinitis 10/15/2015  . Alcohol abuse 07/14/2015  . GAD (generalized anxiety disorder) 07/14/2015  . Depression, major, recurrent, moderate (HCC) 07/14/2015  . Periscapular pain 01/19/2015  .  Left-sided thoracic back pain-greater than 20 years duration 01/12/2015  . GERD (gastroesophageal reflux disease) 08/22/2013  . 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 Abnormal ECG- T  ST changes in V3 through V5 05/02/2011    Past Surgical History:  Procedure Laterality Date  . CESAREAN SECTION  10/15/2009, 02/08/2006  . INCISION AND DRAINAGE PERIRECTAL ABSCESS Bilateral 11/22/2016   Procedure: IRRIGATION AND DEBRIDEMENT PERIRECTAL ABSCESS;  Surgeon: Ovidio Kin, MD;  Location: [facility];  Service: General;  Laterality: Bilateral;    OB History    No data available     Home Medications    Prior to Admission medications   Medication Sig Start Date End Date Taking? Authorizing Provider  busPIRone (BUSPAR) 10 MG tablet Take 10 mg by mouth 3 (three) times daily.   Yes [provider]  traZODone (DESYREL) 100 MG tablet Take 100 mg by mouth at bedtime.   Yes [provider]  albuterol (PROVENTIL) (2.5 MG/3ML) 0.083% nebulizer solution Take 3 mLs (2.5 mg total) by nebulization every 4 (four) hours as needed for wheezing or shortness of breath. 12/16/15   Whiteheart, Mellody Life, NP  beclomethasone (QVAR) 80 MCG/ACT inhaler Inhale 1 puff into the lungs 2 (two) times daily. 12/16/15   Whiteheart, Mellody Life, NP  budesonide-formoterol (SYMBICORT) 160-4.5 MCG/ACT inhaler Inhale 2 puffs into the lungs 2 (two) times daily. 07/21/16   Oretha Milch, MD  ciprofloxacin (CIPRO) 500 MG tablet Take 1 tablet (500 mg total)  by mouth 2 (two) times daily. 11/23/16   Evangeline Gula A, PA-C  FLUoxetine (PROZAC) 20 MG capsule Take 1 capsule by mouth daily. 10/26/16   [provider]  gabapentin (NEURONTIN) 400 MG capsule Take 400 mg by mouth 3 (three) times daily.  09/13/15   [provider]  levonorgestrel (MIRENA) 20 MCG/24HR IUD 1 Intra Uterine Device (1 each total) by Intrauterine route once. 07/16/15   Adonis Brook, NP    Family History Family History    Problem Relation Age of Onset  . Heart disease Father   . Rheumatologic disease Mother   . Lung cancer Paternal Grandmother     Social History Social History  Substance Use Topics  . Smoking status: Current Every Day Smoker    Types: Cigarettes  . Smokeless tobacco: Never Used  . Alcohol use No     Allergies   Penicillins   Review of Systems Review of Systems  Constitutional: Negative for chills and fever.  Gastrointestinal: Negative for abdominal pain, nausea and vomiting.  Skin:       Abscess to the inner buttock area.   A complete 10 system review of systems was obtained and all systems are negative except as noted in the HPI and PMH.    Physical Exam Updated Vital Signs BP 129/89 (BP Location: Left Arm)   Pulse 88   Temp 98.2 F (36.8 C) (Oral)   Resp 18   SpO2 100%   Physical Exam  Constitutional: She is oriented to person, place, and time. Vital signs are normal. She appears well-developed and well-nourished.  HENT:  Head: Normocephalic.  Right Ear: Hearing normal.  Left Ear: Hearing normal.  Eyes: Conjunctivae and EOM are normal. Pupils are equal, round, and reactive to light.  Neck: Normal range of motion. Neck supple.  Cardiovascular: Normal rate, regular rhythm and normal heart sounds.   Pulmonary/Chest: Effort normal and breath sounds normal.  Abdominal: Soft. She exhibits no distension.  Genitourinary:  Genitourinary Comments: Chaperone present.   1 cm fluctuance abscess noted at 7 o'clock from rectum and 2 cm from anus.  Mild tenderness to palpation.  No erythema. No purulence.  Non thrombosed hemorrhoid at 11 o'clock from rectum. No active bleeding.  Musculoskeletal: Normal range of motion.  Neurological: She is alert and oriented to person, place, and time.  Skin: Skin is warm and dry.  Psychiatric: She has a normal mood and affect. Her speech is normal and behavior is normal. Thought content normal.  Nursing note and vitals reviewed.  ED  Treatments / Results  DIAGNOSTIC STUDIES: Oxygen Saturation is 100% on RA, normal by my interpretation.   COORDINATION OF CARE: 10:46 PM-Discussed next steps with pt. Pt verbalized understanding and is agreeable with the plan.   Labs (all labs ordered are listed, but only abnormal results are displayed) Labs Reviewed  BASIC METABOLIC PANEL - Abnormal; Notable for the following:       Result Value   Calcium 8.7 (*)    All other components within normal limits  CBC    EKG  EKG Interpretation None      Radiology No results found.  Procedures .Marland KitchenIncision and Drainage Date/Time: 04/23/2017 11:52 PM Performed by: Audry Pili Authorized by: Audry Pili   Consent:    Consent obtained:  Verbal   Consent given by:  Patient   Risks discussed:  Bleeding and incomplete drainage   Alternatives discussed:  No treatment Location:    Type:  Abscess   Size:  1   Location:  Anogenital   Anogenital location:  Perianal Pre-procedure details:    Skin preparation:  Betadine Anesthesia (see MAR for exact dosages):    Anesthesia method:  Local infiltration   Local anesthetic:  Lidocaine 1% w/o epi Procedure type:    Complexity:  Simple Procedure details:    Incision types:  Stab incision and single straight   Incision depth:  Dermal   Scalpel blade:  11   Wound management:  Probed and deloculated and irrigated with saline   Drainage:  Bloody and purulent   Drainage amount:  Moderate   Wound treatment:  Wound left open   Packing materials:  1/2 in gauze Post-procedure details:    Patient tolerance of procedure:  Tolerated well, no immediate complications   (including critical care time)  Medications Ordered in ED Medications - No data to display   Initial Impression / Assessment and Plan / ED Course  I have reviewed the triage vital signs and the nursing notes.  Pertinent labs & imaging results that were available during my care of the patient were reviewed by me and  considered in my medical decision making (see chart for details).    Final Clinical Impressions(s) / ED Diagnoses  {I have reviewed and evaluated the relevant laboratory values.   {I have reviewed the relevant previous healthcare records.  {I obtained HPI from historian. {Patient discussed with supervising physician.  ED Course:  Assessment: Pt is a 36 y.o. female who presents to the Emergency Department with perianal skin abscess amenable to incision and drainage. Doubt rectum involvement. 2cm from anus. Non TTP on DRE exam no induration palpable. No signs of cellulitis surrounding skin. Purulence expressed on I+D. Irrigated with saline. CBC without leukocytosis. Given Rx ABX. Pt also noted to have non thrombosed external hemorrhoids. Will d/c to home. Close follow up to General Surgery for hemorrhoid as well as abscess. Strict return precautions given. At time of discharge, Patient is in no acute distress. Vital Signs are stable. Patient is able to ambulate. Patient able to tolerate PO.   Disposition/Plan:  DC Home Additional Verbal discharge instructions given and discussed with patient.  Pt Instructed to f/u with PCP in the next week for evaluation and treatment of symptoms. Return precautions given Pt acknowledges and agrees with plan  Supervising Physician Rolan BuccoBelfi, Melanie, MD  Final diagnoses:  External hemorrhoid  Cutaneous abscess of buttock    New Prescriptions New Prescriptions   No medications on file   I personally performed the services described in this documentation, which was scribed in my presence. The recorded information has been reviewed and is accurate.    Audry PiliMohr, Sederick Jacobsen, PA-C 04/23/17 2358    Rolan BuccoBelfi, Melanie, MD 04/23/17 [phone]

## 2017-06-13 DIAGNOSIS — F1021 Alcohol dependence, in remission: Secondary | ICD-10-CM | POA: Insufficient documentation

## 2017-06-13 DIAGNOSIS — F5231 Female orgasmic disorder: Secondary | ICD-10-CM | POA: Insufficient documentation

## 2017-09-26 DIAGNOSIS — K648 Other hemorrhoids: Secondary | ICD-10-CM | POA: Insufficient documentation

## 2018-01-05 ENCOUNTER — Other Ambulatory Visit: Payer: Self-pay

## 2018-01-05 ENCOUNTER — Inpatient Hospital Stay (HOSPITAL_COMMUNITY)
Admission: EM | Admit: 2018-01-05 | Discharge: 2018-01-08 | DRG: 194 | Disposition: A | Discharge status: Home / Self Care | Payer: BLUE CROSS/BLUE SHIELD | Attending: Internal Medicine | Admitting: Internal Medicine

## 2018-01-05 ENCOUNTER — Emergency Department (HOSPITAL_COMMUNITY): Payer: BLUE CROSS/BLUE SHIELD

## 2018-01-05 ENCOUNTER — Encounter (HOSPITAL_COMMUNITY): Payer: Self-pay

## 2018-01-05 DIAGNOSIS — J4541 Moderate persistent asthma with (acute) exacerbation: Secondary | ICD-10-CM

## 2018-01-05 DIAGNOSIS — F17210 Nicotine dependence, cigarettes, uncomplicated: Secondary | ICD-10-CM | POA: Diagnosis present

## 2018-01-05 DIAGNOSIS — J45901 Unspecified asthma with (acute) exacerbation: Secondary | ICD-10-CM | POA: Diagnosis present

## 2018-01-05 DIAGNOSIS — J4551 Severe persistent asthma with (acute) exacerbation: Secondary | ICD-10-CM | POA: Diagnosis present

## 2018-01-05 DIAGNOSIS — J101 Influenza due to other identified influenza virus with other respiratory manifestations: Principal | ICD-10-CM | POA: Diagnosis present

## 2018-01-05 DIAGNOSIS — R0602 Shortness of breath: Secondary | ICD-10-CM | POA: Diagnosis not present

## 2018-01-05 DIAGNOSIS — Z880 Allergy status to penicillin: Secondary | ICD-10-CM

## 2018-01-05 DIAGNOSIS — Z793 Long term (current) use of hormonal contraceptives: Secondary | ICD-10-CM

## 2018-01-05 DIAGNOSIS — F419 Anxiety disorder, unspecified: Secondary | ICD-10-CM | POA: Diagnosis present

## 2018-01-05 LAB — BASIC METABOLIC PANEL
ANION GAP: 13 (ref 5–15)
BUN: 17 mg/dL (ref 6–20)
CALCIUM: 8.9 mg/dL (ref 8.9–10.3)
CO2: 19 mmol/L — AB (ref 22–32)
Chloride: 101 mmol/L (ref 101–111)
Creatinine, Ser: 1 mg/dL (ref 0.44–1.00)
GFR calc Af Amer: >60 mL/min (ref >60)
GFR calc non Af Amer: >60 mL/min (ref >60)
GLUCOSE: 121 mg/dL — AB (ref 65–99)
POTASSIUM: 4.5 mmol/L (ref 3.5–5.1)
Sodium: 133 mmol/L — ABNORMAL LOW (ref 135–145)

## 2018-01-05 LAB — CBC
HEMATOCRIT: 46.5 % — AB (ref 36.0–46.0)
HEMOGLOBIN: 15.6 g/dL — AB (ref 12.0–15.0)
MCH: 30.9 pg (ref 26.0–34.0)
MCHC: 33.5 g/dL (ref 30.0–36.0)
MCV: 92.1 fL (ref 78.0–100.0)
Platelets: 283 10*3/uL (ref 150–400)
RBC: 5.05 MIL/uL (ref 3.87–5.11)
RDW: 12.6 % (ref 11.5–15.5)
WBC: 10 10*3/uL (ref 4.0–10.5)

## 2018-01-05 LAB — I-STAT TROPONIN, ED: Troponin i, poc: 0 ng/mL (ref 0.00–0.08)

## 2018-01-05 LAB — I-STAT BETA HCG BLOOD, ED (MC, WL, AP ONLY)

## 2018-01-05 MED ORDER — FLUOXETINE HCL 20 MG PO CAPS
60.0000 mg | ORAL_CAPSULE | Freq: Every day | ORAL | Status: DC
  Administered 2018-01-06 – 2018-01-08 (×3): 60 mg via ORAL
  Filled 2018-01-05 (×3): qty 3

## 2018-01-05 MED ORDER — IPRATROPIUM-ALBUTEROL 0.5-2.5 (3) MG/3ML IN SOLN
3.0000 mL | Freq: Once | RESPIRATORY_TRACT | Status: AC
  Administered 2018-01-05: 3 mL via RESPIRATORY_TRACT
  Filled 2018-01-05: qty 3

## 2018-01-05 MED ORDER — LORAZEPAM 1 MG PO TABS
0.5000 mg | ORAL_TABLET | Freq: Once | ORAL | Status: AC
  Administered 2018-01-05: 0.5 mg via ORAL
  Filled 2018-01-05: qty 1

## 2018-01-05 MED ORDER — GABAPENTIN 600 MG PO TABS
600.0000 mg | ORAL_TABLET | Freq: Three times a day (TID) | ORAL | Status: DC
  Administered 2018-01-05 – 2018-01-08 (×8): 600 mg via ORAL
  Filled 2018-01-05 (×8): qty 1

## 2018-01-05 MED ORDER — MAGNESIUM SULFATE 2 GM/50ML IV SOLN
2.0000 g | Freq: Once | INTRAVENOUS | Status: AC
  Administered 2018-01-05: 2 g via INTRAVENOUS
  Filled 2018-01-05: qty 50

## 2018-01-05 MED ORDER — IPRATROPIUM-ALBUTEROL 0.5-2.5 (3) MG/3ML IN SOLN
3.0000 mL | RESPIRATORY_TRACT | Status: DC | PRN
  Administered 2018-01-06 – 2018-01-08 (×5): 3 mL via RESPIRATORY_TRACT
  Filled 2018-01-05 (×5): qty 3

## 2018-01-05 MED ORDER — HYDROCODONE-HOMATROPINE 5-1.5 MG/5ML PO SYRP
5.0000 mL | ORAL_SOLUTION | Freq: Four times a day (QID) | ORAL | Status: DC | PRN
  Administered 2018-01-05 – 2018-01-08 (×7): 5 mL via ORAL
  Filled 2018-01-05 (×7): qty 5

## 2018-01-05 MED ORDER — PREDNISONE 20 MG PO TABS: 40.0000 mg | ORAL_TABLET | Freq: Every day | ORAL | Status: DC

## 2018-01-05 MED ORDER — ALBUTEROL SULFATE (2.5 MG/3ML) 0.083% IN NEBU
5.0000 mg | INHALATION_SOLUTION | Freq: Once | RESPIRATORY_TRACT | Status: AC
  Administered 2018-01-05: 5 mg via RESPIRATORY_TRACT
  Filled 2018-01-05: qty 6

## 2018-01-05 MED ORDER — IPRATROPIUM-ALBUTEROL 0.5-2.5 (3) MG/3ML IN SOLN
3.0000 mL | Freq: Four times a day (QID) | RESPIRATORY_TRACT | Status: DC
  Administered 2018-01-05 – 2018-01-07 (×6): 3 mL via RESPIRATORY_TRACT
  Filled 2018-01-05 (×6): qty 3

## 2018-01-05 MED ORDER — ENOXAPARIN SODIUM 40 MG/0.4ML ~~LOC~~ SOLN
40.0000 mg | SUBCUTANEOUS | Status: DC
  Administered 2018-01-06 – 2018-01-08 (×3): 40 mg via SUBCUTANEOUS
  Filled 2018-01-05 (×3): qty 0.4

## 2018-01-05 MED ORDER — TRAZODONE HCL 100 MG PO TABS
100.0000 mg | ORAL_TABLET | Freq: Every evening | ORAL | Status: DC | PRN
  Administered 2018-01-06: 100 mg via ORAL
  Filled 2018-01-05: qty 1

## 2018-01-05 MED ORDER — ALBUTEROL (5 MG/ML) CONTINUOUS INHALATION SOLN
10.0000 mg/h | INHALATION_SOLUTION | RESPIRATORY_TRACT | Status: DC
  Administered 2018-01-05: 10 mg/h via RESPIRATORY_TRACT
  Filled 2018-01-05: qty 20

## 2018-01-05 MED ORDER — SODIUM CHLORIDE 0.9% FLUSH
3.0000 mL | Freq: Two times a day (BID) | INTRAVENOUS | Status: DC
  Administered 2018-01-05 – 2018-01-08 (×5): 3 mL via INTRAVENOUS

## 2018-01-05 MED ORDER — PREDNISONE 20 MG PO TABS
40.0000 mg | ORAL_TABLET | Freq: Once | ORAL | Status: AC
  Administered 2018-01-05: 40 mg via ORAL
  Filled 2018-01-05: qty 2

## 2018-01-05 MED ORDER — PROPRANOLOL HCL 10 MG PO TABS: 10.0000 mg | ORAL_TABLET | Freq: Three times a day (TID) | ORAL | Status: DC | PRN

## 2018-01-05 MED ORDER — SODIUM CHLORIDE 0.9 % IV BOLUS (SEPSIS)
500.0000 mL | Freq: Once | INTRAVENOUS | Status: AC
  Administered 2018-01-05: 500 mL via INTRAVENOUS

## 2018-01-05 NOTE — ED Notes (Signed)
Patient at xray

## 2018-01-05 NOTE — ED Notes (Signed)
Patient back from x-ray 

## 2018-01-05 NOTE — ED Triage Notes (Signed)
Pt presents to the ed for complaints of an asthma attack x 20 min with sudden chest pain.  Pt is wheezing in all long fields, labored, unable to speak full sentences in triage.

## 2018-01-05 NOTE — ED Notes (Signed)
Respiratory at bedside.

## 2018-01-05 NOTE — Progress Notes (Signed)
Pt admitted to the unit from ED; pt A&O x4; pt oriented to the unit and room; fall/safety precaution and prevention education completed with pt and pt voices understanding. Bed alarm on; pt spouse at bedside; call light within reach; Droplet precaution initiated; VSS; reported off to [employer] [place]. Dionne BucyP. Amo Fitzgerald Dunne RN

## 2018-01-05 NOTE — ED Notes (Signed)
Admitting at bedside 

## 2018-01-05 NOTE — ED Provider Notes (Signed)
[facility] Provider Note   CSN: [id number] Arrival date & time: 01/05/18  1331     History   Chief Complaint Chief Complaint  Patient presents with  . Chest Pain  . Shortness of Breath    HPI JOSEE SPEECE is a 37 y.o. female.  The history is provided by the patient. No language interpreter was used.  Chest Pain   Associated symptoms include shortness of breath.  Shortness of Breath  Associated symptoms include chest pain.   CAMIYAH FRIBERG is a 37 y.o. female who presents to the Emergency Department complaining of CP/SOB.  She has a history of asthma and reports having upper respiratory tract infection symptoms for the last 5 days with nasal congestion, sore throat, cough, body aches.  Over the last 24 hours she has experienced increased shortness of breath with central chest tightness.  She now feels like she is having an asthma exacerbation.  No reports of fevers.  She denies any hemoptysis, leg swelling or pain, long trips, recent surgeries.  She does smoke tobacco and has a Mirena IUD in place.  She received an albuterol treatment in triage and feels partial improvement in her symptoms. Past Medical History:  Diagnosis Date  . Anxiety   . Asthma   . Chronic bronchitis (HCC)    "get it pretty much q yr" (12/13/2015)  . Depression   . Depression   . GERD (gastroesophageal reflux disease)   . Insomnia   . Palpitations   . Pneumonia    "multiple times when I was a child; once as an adult so far" (12/13/2015)  . Pre-syncope   . PVC (premature ventricular contraction)   . Seasonal allergies   . Tachycardia     Patient Active Problem List   Diagnosis Date Noted  . Asthma exacerbation 01/05/2018  . Perianal abscess 11/22/2016  . Asthma 10/15/2015  . Allergic rhinitis 10/15/2015  . Alcohol abuse 07/14/2015  . GAD (generalized anxiety disorder) 07/14/2015  . Depression, major, recurrent, moderate (HCC) 07/14/2015  . Periscapular pain 01/19/2015    . Left-sided thoracic back pain-greater than 20 years duration 01/12/2015  . GERD (gastroesophageal reflux disease) 08/22/2013  . 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 Abnormal ECG- T  ST changes in V3 through V5 05/02/2011    Past Surgical History:  Procedure Laterality Date  . CESAREAN SECTION  10/15/2009, 02/08/2006  . INCISION AND DRAINAGE PERIRECTAL ABSCESS Bilateral 11/22/2016   Procedure: IRRIGATION AND DEBRIDEMENT PERIRECTAL ABSCESS;  Surgeon: Ovidio Kin, MD;  Location: [facility];  Service: General;  Laterality: Bilateral;    OB History    No data available       Home Medications    Prior to Admission medications   Medication Sig Start Date End Date Taking? Authorizing Provider  albuterol (PROVENTIL) (2.5 MG/3ML) 0.083% nebulizer solution Take 3 mLs (2.5 mg total) by nebulization every 4 (four) hours as needed for wheezing or shortness of breath. 12/16/15  Yes Whiteheart, Mellody Life, NP  FLUoxetine HCl 60 MG TABS Take 60 mg by mouth daily. 12/21/17  Yes [provider]  gabapentin (NEURONTIN) 600 MG tablet Take 600 mg by mouth 3 (three) times daily. 12/29/17  Yes [provider]  levonorgestrel (MIRENA) 20 MCG/24HR IUD 1 Intra Uterine Device (1 each total) by Intrauterine route once. 07/16/15  Yes Adonis Brook, NP  PROAIR HFA 108 [phone] Base) MCG/ACT inhaler Inhale 1-2 puffs into the lungs every 6 (six) hours  as needed for shortness of breath or wheezing. 12/21/17  Yes [provider]  propranolol (INDERAL) 10 MG tablet Take 10 mg by mouth 3 (three) times daily as needed (anxiety).  12/21/17  Yes [provider]  traZODone (DESYREL) 100 MG tablet Take 100 mg by mouth at bedtime as needed for sleep.    Yes [provider]  beclomethasone (QVAR) 80 MCG/ACT inhaler Inhale 1 puff into the lungs 2 (two) times daily. Patient not taking: Reported on 01/05/2018 12/16/15   Bernadene PersonWhiteheart, Kathryn A, NP  atenolol (TENORMIN) 25 MG tablet  Take 25 mg by mouth daily.  02/10/12  Duke SalviaKlein, Steven C, MD  diphenhydrAMINE (BENADRYL) 25 MG tablet Take by mouth daily as needed.    02/10/12  [provider]  metoprolol succinate (TOPROL-XL) 25 MG 24 hr tablet Take 25 mg by mouth daily.  02/10/12  Duke SalviaKlein, Steven C, MD  metoprolol tartrate (LOPRESSOR) 25 MG tablet Take 1 tablet (25 mg total) by mouth 2 (two) times daily. 05/02/11 02/10/12  Duke SalviaKlein, Steven C, MD    Family History Family History  Problem Relation Age of Onset  . Heart disease Father   . Rheumatologic disease Mother   . Lung cancer Paternal Grandmother     Social History Social History   Tobacco Use  . Smoking status: Current Every Day Smoker    Types: Cigarettes  . Smokeless tobacco: Never Used  Substance Use Topics  . Alcohol use: No  . Drug use: No     Allergies   Penicillins   Review of Systems Review of Systems  Respiratory: Positive for shortness of breath.   Cardiovascular: Positive for chest pain.  All other systems reviewed and are negative.    Physical Exam Updated Vital Signs BP 129/74 (BP Location: Right Arm)   Pulse 98   Temp 97.9 F (36.6 C) (Oral)   Resp 19   Wt 74.4 kg (164 lb)   SpO2 98%   BMI 28.15 kg/m   Physical Exam  Constitutional: She is oriented to person, place, and time. She appears well-developed and well-nourished.  HENT:  Head: Normocephalic and atraumatic.  Cardiovascular: Regular rhythm.  No murmur heard. tachycardic  Pulmonary/Chest: Effort normal. No respiratory distress.  Diffuse end expiratory wheezes.  Normal work of breathing.  No accessory muscle use.  Abdominal: Soft. There is no tenderness. There is no rebound and no guarding.  Musculoskeletal: She exhibits no edema or tenderness.  Neurological: She is alert and oriented to person, place, and time.  Skin: Skin is warm and dry.  Psychiatric: She has a normal mood and affect. Her behavior is normal.  Nursing note and vitals reviewed.    ED  Treatments / Results  Labs (all labs ordered are listed, but only abnormal results are displayed) Labs Reviewed  BASIC METABOLIC PANEL - Abnormal; Notable for the following components:      Result Value   Sodium 133 (*)    CO2 19 (*)    Glucose, Bld 121 (*)    All other components within normal limits  CBC - Abnormal; Notable for the following components:   Hemoglobin 15.6 (*)    HCT 46.5 (*)    All other components within normal limits  RESPIRATORY PANEL BY PCR  HIV ANTIBODY (ROUTINE TESTING)  COMPREHENSIVE METABOLIC PANEL  I-STAT TROPONIN, ED  I-STAT BETA HCG BLOOD, ED ([facility], [facility], [facility] ONLY)    EKG  EKG Interpretation  Date/Time:  Saturday January 05 2018 13:42:18 EST Ventricular Rate:  112 PR Interval:  130 QRS Duration: 62 QT Interval:  332 QTC Calculation: 453 R Axis:   95 Text Interpretation:  Sinus tachycardia Rightward axis Anteroseptal infarct , age undetermined Abnormal ECG Interpretation limited secondary to artifact Confirmed by Tilden Fossa [phone]) on 01/05/2018 3:03:16 PM       Radiology Dg Chest 2 View  Result Date: 01/05/2018 CLINICAL DATA:  Chest pain and shortness of breath. History of asthma. EXAM: CHEST  2 VIEW COMPARISON:  Chest radiograph July 07, 2016 FINDINGS: Lungs are borderline hyperexpanded. No edema or consolidation. Heart size and pulmonary vascularity are normal. No adenopathy. No bone lesions. No pneumothorax. IMPRESSION: Lungs borderline hyperexpanded without edema or consolidation. Borderline hyperexpansion may be secondary to chronic asthma. Electronically Signed   By: Bretta Bang III M.D.   On: 01/05/2018 14:51    Procedures Procedures (including critical care time)  Medications Ordered in ED Medications  FLUoxetine (PROZAC) capsule 60 mg (not administered)  gabapentin (NEURONTIN) tablet 600 mg (600 mg Oral Given 01/05/18 2348)  propranolol (INDERAL) tablet 10 mg (not administered)  traZODone (DESYREL) tablet 100 mg (not  administered)  ipratropium-albuterol (DUONEB) 0.5-2.5 (3) MG/3ML nebulizer solution 3 mL (3 mLs Nebulization Given 01/05/18 2334)  predniSONE (DELTASONE) tablet 40 mg (not administered)  HYDROcodone-homatropine (HYCODAN) 5-1.5 MG/5ML syrup 5 mL (5 mLs Oral Given 01/05/18 2348)  enoxaparin (LOVENOX) injection 40 mg (not administered)  sodium chloride flush (NS) 0.9 % injection 3 mL (3 mLs Intravenous Given 01/05/18 2348)  ipratropium-albuterol (DUONEB) 0.5-2.5 (3) MG/3ML nebulizer solution 3 mL (not administered)  albuterol (PROVENTIL) (2.5 MG/3ML) 0.083% nebulizer solution 5 mg (5 mg Nebulization Given 01/05/18 1348)  predniSONE (DELTASONE) tablet 40 mg (40 mg Oral Given 01/05/18 1556)  ipratropium-albuterol (DUONEB) 0.5-2.5 (3) MG/3ML nebulizer solution 3 mL (3 mLs Nebulization Given 01/05/18 1556)  sodium chloride 0.9 % bolus 500 mL (0 mLs Intravenous Stopped 01/05/18 1745)  magnesium sulfate IVPB 2 g 50 mL (0 g Intravenous Stopped 01/05/18 1830)  LORazepam (ATIVAN) tablet 0.5 mg (0.5 mg Oral Given 01/05/18 1731)  ipratropium-albuterol (DUONEB) 0.5-2.5 (3) MG/3ML nebulizer solution 3 mL (3 mLs Nebulization Given 01/05/18 2014)  LORazepam (ATIVAN) tablet 0.5 mg (0.5 mg Oral Given 01/06/18 0037)     Initial Impression / Assessment and Plan / ED Course  I have reviewed the triage vital signs and the nursing notes.  Pertinent labs & imaging results that were available during my care of the patient were reviewed by me and considered in my medical decision making (see chart for details).     Patient with history of asthma here with increased shortness of breath and chest tightness.  She does have tachypnea with diffuse wheezing on examination.  She has persistent wheezing and tachypnea despite multiple treatments in the emergency department.  She did develop anxiety and tearfulness that she relates to family issues.  She was given half a milligram of Ativan for anxiety.  There was no significant improvement in her  wheezing following magnesium sulfate as well as additional nebs.  Medicine consulted for admission for asthma exacerbation.  No evidence of pneumonia, CHF.  Presentation is not consistent with PE.  Final Clinical Impressions(s) / ED Diagnoses   Final diagnoses:  None    ED Discharge Orders    None       Tilden Fossa, MD 01/06/18 0116

## 2018-01-05 NOTE — H&P (Signed)
History and Physical   Chrystie NoseJennifer L Keen ZOX:[id number] DOB: [birth year] DOA: 01/05/2018  PCP: System, Provider Not In  Chief Complaint: shortness of breath  HPI:  This 37 year old woman with history of severe persistent asthma, current every day smoker, anxiety who presents with shortness of breath.  Onset was over the past 1 week, has used rescue nebulizer daily. The past 2 days she's had upper respiratory symptoms including rhinorrhea and cough.  She reports no longer having a peak flow meter. She reports that her children have been sick with flu. She denies any fevers or chills. She does have pleuritic chest pain.  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. She is not interested in smoking cessation at this time, sites significant social stressors precluding this. She's never been intubated. She reports having a maintenance inhaler that she takes twice a day, inhaled steroid.  This evening she noticed difficulty talking, becoming breathless, therefore sought medical evaluation.  ED Course:  In the emergency department is tachycardic to 104, had increase respiratory rate between 20-25.  BMP and CBC remarkable for glucose of 121, creatinine of 1, normal white count, hemoglobin 15.6. Chest x-ray revealed hyperexpansion no infiltrate. Pregnancy test negative.  She was given continuous as well as intermittent nebulizer treatment, prednisone 40 mg, lorazepam, 500 mL bolus of normal saline, magnesium 2 mg IV; hospital medicine consulted for further management..   Review of Systems: A complete ROS was obtained; pertinent positives negatives are denoted in the HPI. Otherwise, all systems are negative.   Past Medical History:  Diagnosis Date  . Anxiety   . Asthma   . Chronic bronchitis (HCC)    "get it pretty much q yr" (12/13/2015)  . Depression   . Depression   . GERD (gastroesophageal reflux disease)   . Insomnia   . Palpitations   . Pneumonia    "multiple  times when I was a child; once as an adult so far" (12/13/2015)  . Pre-syncope   . PVC (premature ventricular contraction)   . Seasonal allergies   . Tachycardia    Social History   Socioeconomic History  . Marital status: Married    Spouse name: Not on file  . Number of children: 2  . Years of education: Not on file  . Highest education level: Not on file  Social Needs  . Financial resource strain: Not on file  . Food insecurity - worry: Not on file  . Food insecurity - inability: Not on file  . Transportation needs - medical: Not on file  . Transportation needs - non-medical: Not on file  Occupational History  . Occupation: respiratory therapists    Employer: [employer]  Tobacco Use  . Smoking status: Current Every Day Smoker    Types: Cigarettes  . Smokeless tobacco: Never Used  Substance and Sexual Activity  . Alcohol use: No  . Drug use: No  . Sexual activity: Not on file  Other Topics Concern  . Not on file  Social History Narrative  . Not on file   Family History  Problem Relation Age of Onset  . Heart disease Father   . Rheumatologic disease Mother   . Lung cancer Paternal Grandmother     Physical Exam: Vitals:   01/05/18 1733 01/05/18 1800 01/05/18 1900 01/05/18 1930  BP:  112/75 (!) 121/98 114/80  Pulse:  84 88 89  Resp:  14 16   SpO2: 99% 99% 94% 94%  Weight:  General: Appears mildly anxious, white woman, NAD ENT: Grossly normal hearing, MMM. No exudates. Cardiovascular: Rate ~110. No M/R/G. No LE edema.  Respiratory:  Breathing room air, speaking in complete sentences without difficulty, RR ~24, diffuse expiratory wheezing throughout all lung fields, adequate air entry Abdomen: Soft, non-tender.  Skin: No rash or induration seen on limited exam. Musculoskeletal: Grossly normal tone BUE/BLE. Appropriate ROM.  Psychiatric: Grossly normal mood and affect. Neurologic: Moves all extremities in coordinated fashion.  I have personally reviewed  the following labs, culture data, and imaging studies.  Assessment/Plan:  #Acute asthma exacerbation Possibly as a result of URI vs other etiology. Has improved with acute treatment provided in ED, but still with significant wheezing. Will procure RVP to rule out atypical / viral etiology. Continue with scheduled bronchodilators (duonebs) for now q 6 hrs with q 2 h if breakthrough.  Will continue prednisone 40 po x 5 day for burst.   Hycodan prn for cough.  #Other problems -Smoking: not interested in quitting at this time, she declined nicotine patches -Anxiety: continue home gabapentin, SSRI, propranolol  DVT prophylaxis: Subq Lovenox Code Status: Full code Disposition Plan: Anticipate D/C home in 2-5 days Consults called: none Admission status: admit to hospital medicine   Laurell Roof, MD [facility] Page:[phone]  If 7PM-7AM, please contact night-coverage [email or web address] Password [email or web address]

## 2018-01-06 DIAGNOSIS — R0602 Shortness of breath: Secondary | ICD-10-CM | POA: Diagnosis present

## 2018-01-06 DIAGNOSIS — F17210 Nicotine dependence, cigarettes, uncomplicated: Secondary | ICD-10-CM | POA: Diagnosis present

## 2018-01-06 DIAGNOSIS — F419 Anxiety disorder, unspecified: Secondary | ICD-10-CM | POA: Diagnosis present

## 2018-01-06 DIAGNOSIS — J4551 Severe persistent asthma with (acute) exacerbation: Secondary | ICD-10-CM | POA: Diagnosis present

## 2018-01-06 DIAGNOSIS — Z880 Allergy status to penicillin: Secondary | ICD-10-CM | POA: Diagnosis not present

## 2018-01-06 DIAGNOSIS — J101 Influenza due to other identified influenza virus with other respiratory manifestations: Secondary | ICD-10-CM | POA: Diagnosis present

## 2018-01-06 DIAGNOSIS — J45901 Unspecified asthma with (acute) exacerbation: Secondary | ICD-10-CM

## 2018-01-06 DIAGNOSIS — Z793 Long term (current) use of hormonal contraceptives: Secondary | ICD-10-CM | POA: Diagnosis not present

## 2018-01-06 LAB — RESPIRATORY PANEL BY PCR
Adenovirus: NOT DETECTED
Bordetella pertussis: NOT DETECTED
CORONAVIRUS HKU1-RVPPCR: NOT DETECTED
CORONAVIRUS NL63-RVPPCR: NOT DETECTED
CORONAVIRUS OC43-RVPPCR: NOT DETECTED
Chlamydophila pneumoniae: NOT DETECTED
Coronavirus 229E: NOT DETECTED
INFLUENZA A H3-RVPPCR: NOT DETECTED
INFLUENZA A-RVPPCR: DETECTED — AB
INFLUENZA B-RVPPCR: NOT DETECTED
Influenza A H1 2009: NOT DETECTED
Influenza A H1: NOT DETECTED
Metapneumovirus: NOT DETECTED
Mycoplasma pneumoniae: NOT DETECTED
PARAINFLUENZA VIRUS 1-RVPPCR: NOT DETECTED
PARAINFLUENZA VIRUS 2-RVPPCR: NOT DETECTED
PARAINFLUENZA VIRUS 3-RVPPCR: NOT DETECTED
PARAINFLUENZA VIRUS 4-RVPPCR: NOT DETECTED
RHINOVIRUS / ENTEROVIRUS - RVPPCR: NOT DETECTED
Respiratory Syncytial Virus: NOT DETECTED

## 2018-01-06 LAB — COMPREHENSIVE METABOLIC PANEL
ALBUMIN: 3.3 g/dL — AB (ref 3.5–5.0)
ALK PHOS: 70 U/L (ref 38–126)
ALT: 10 U/L — AB (ref 14–54)
AST: 19 U/L (ref 15–41)
Anion gap: 12 (ref 5–15)
BILIRUBIN TOTAL: 0.3 mg/dL (ref 0.3–1.2)
BUN: 10 mg/dL (ref 6–20)
CO2: 20 mmol/L — AB (ref 22–32)
Calcium: 8.3 mg/dL — ABNORMAL LOW (ref 8.9–10.3)
Chloride: 102 mmol/L (ref 101–111)
Creatinine, Ser: 0.55 mg/dL (ref 0.44–1.00)
GFR calc Af Amer: >60 mL/min (ref >60)
GFR calc non Af Amer: >60 mL/min (ref >60)
GLUCOSE: 137 mg/dL — AB (ref 65–99)
Potassium: 3.4 mmol/L — ABNORMAL LOW (ref 3.5–5.1)
SODIUM: 134 mmol/L — AB (ref 135–145)
TOTAL PROTEIN: 6.4 g/dL — AB (ref 6.5–8.1)

## 2018-01-06 MED ORDER — ALPRAZOLAM 0.5 MG PO TABS
0.5000 mg | ORAL_TABLET | Freq: Three times a day (TID) | ORAL | Status: DC | PRN
  Administered 2018-01-06 – 2018-01-08 (×4): 0.5 mg via ORAL
  Filled 2018-01-06 (×4): qty 1

## 2018-01-06 MED ORDER — OSELTAMIVIR PHOSPHATE 75 MG PO CAPS
75.0000 mg | ORAL_CAPSULE | Freq: Two times a day (BID) | ORAL | Status: DC
  Administered 2018-01-06 – 2018-01-08 (×5): 75 mg via ORAL
  Filled 2018-01-06 (×5): qty 1

## 2018-01-06 MED ORDER — LORAZEPAM 0.5 MG PO TABS
0.5000 mg | ORAL_TABLET | Freq: Once | ORAL | Status: AC
  Administered 2018-01-06: 0.5 mg via ORAL
  Filled 2018-01-06: qty 1

## 2018-01-06 MED ORDER — ALBUTEROL (5 MG/ML) CONTINUOUS INHALATION SOLN
10.0000 mg/h | INHALATION_SOLUTION | RESPIRATORY_TRACT | Status: AC
  Administered 2018-01-06: 10 mg/h via RESPIRATORY_TRACT
  Filled 2018-01-06: qty 20

## 2018-01-06 MED ORDER — ALBUTEROL SULFATE (2.5 MG/3ML) 0.083% IN NEBU
INHALATION_SOLUTION | RESPIRATORY_TRACT | Status: AC
  Filled 2018-01-06: qty 12

## 2018-01-06 MED ORDER — METHYLPREDNISOLONE SODIUM SUCC 125 MG IJ SOLR
60.0000 mg | Freq: Two times a day (BID) | INTRAMUSCULAR | Status: DC
  Administered 2018-01-06: 60 mg via INTRAVENOUS
  Filled 2018-01-06: qty 2

## 2018-01-06 MED ORDER — BENZONATATE 100 MG PO CAPS
200.0000 mg | ORAL_CAPSULE | Freq: Three times a day (TID) | ORAL | Status: DC
  Administered 2018-01-06 – 2018-01-08 (×3): 200 mg via ORAL
  Filled 2018-01-06 (×3): qty 2

## 2018-01-06 MED ORDER — METHYLPREDNISOLONE SODIUM SUCC 125 MG IJ SOLR
60.0000 mg | Freq: Four times a day (QID) | INTRAMUSCULAR | Status: DC
  Administered 2018-01-06 – 2018-01-07 (×4): 60 mg via INTRAVENOUS
  Filled 2018-01-06 (×4): qty 2

## 2018-01-06 MED ORDER — ALPRAZOLAM 0.5 MG PO TABS
1.0000 mg | ORAL_TABLET | Freq: Once | ORAL | Status: AC
  Administered 2018-01-06: 1 mg via ORAL
  Filled 2018-01-06: qty 2

## 2018-01-06 NOTE — Progress Notes (Signed)
Pt called RN and requested for her breathing treatment. RT notified and per RT pt tight when he listened to her; pt also noted have a cough, tremor and anxious. Dr. Jomarie LongsJoseph notified and new orders received. Telemetry applied and verified with CCMD: NT called to second verify. Will continue to closely monitor pt. Dionne BucyP Amo Luverne Zerkle RN

## 2018-01-06 NOTE — Progress Notes (Signed)
PROGRESS NOTE    Kelsey NoseJennifer L Keen  ZOX:[id number] DOB: October 24, 1981 DOA: 01/05/2018 PCP: System, Provider Not In  Brief Narrative: This 37 year old woman with history of severe persistent asthma, current every day smoker, anxiety who presented with shortness of breath. Onset was over the past 1 week, has used rescue nebulizer daily. The past 2 days she's had upper respiratory symptoms including rhinorrhea and cough.  Assessment & Plan:     Asthma exacerbation -Still wheezing and very symptomatic, and stop prednisone, start IV Solu-Medrol -Continue nebs scheduled and PRN -Follow-up respiratory virus panel -Chest x-ray is unremarkable  Tobacco abuse -Counseled on admission, declined nicotine patch  Anxiety -Stop propranolol, continue SSRI -Low-dose and neck sparing for now  DVT prophylaxis: Lovenox Code Status: Full code Family Communication: No family at bedside Disposition Plan: Home in 1-2 days      Procedures:   Antimicrobials:    Subjective: Still coughing, wheezing and short of breath  Objective: Vitals:   01/05/18 2338 01/06/18 0253 01/06/18 0300 01/06/18 0725  BP:   (P) 114/76   Pulse:   (P) 96   Resp:   (P) 18   Temp:   (P) 97.8 F (36.6 C)   TempSrc:   (P) Oral   SpO2: 98% 98% 99% 92%  Weight:       No intake or output data in the 24 hours ending 01/06/18 1057 Filed Weights   01/05/18 1346  Weight: 74.4 kg (164 lb)    Examination:  General exam: Appears calm and comfortable, anxious appearing Respiratory system: Scattered expiratory wheezes Cardiovascular system: S1 & S2 heard, RRR.  Gastrointestinal system: Abdomen is nondistended, soft and nontender.Normal bowel sounds heard. Central nervous system: Alert and oriented. No focal neurological deficits. Extremities: Symmetric 5 x 5 power. Skin: No rashes, lesions or ulcers    Data Reviewed:   CBC: Recent Labs  Lab 01/05/18 1346  WBC 10.0  HGB 15.6*  HCT 46.5*  MCV 92.1  PLT 283    Basic Metabolic Panel: Recent Labs  Lab 01/05/18 1346 01/06/18 0439  NA 133* 134*  K 4.5 3.4*  CL 101 102  CO2 19* 20*  GLUCOSE 121* 137*  BUN 17 10  CREATININE 1.00 0.55  CALCIUM 8.9 8.3*   GFR: CrCl cannot be calculated (Unknown ideal weight.). Liver Function Tests: Recent Labs  Lab 01/06/18 0439  AST 19  ALT 10*  ALKPHOS 70  BILITOT 0.3  PROT 6.4*  ALBUMIN 3.3*   No results for input(s): LIPASE, AMYLASE in the last 168 hours. No results for input(s): AMMONIA in the last 168 hours. Coagulation Profile: No results for input(s): INR, PROTIME in the last 168 hours. Cardiac Enzymes: No results for input(s): CKTOTAL, CKMB, CKMBINDEX, TROPONINI in the last 168 hours. BNP (last 3 results) No results for input(s): PROBNP in the last 8760 hours. HbA1C: No results for input(s): HGBA1C in the last 72 hours. CBG: No results for input(s): GLUCAP in the last 168 hours. Lipid Profile: No results for input(s): CHOL, HDL, LDLCALC, TRIG, CHOLHDL, LDLDIRECT in the last 72 hours. Thyroid Function Tests: No results for input(s): TSH, T4TOTAL, FREET4, T3FREE, THYROIDAB in the last 72 hours. Anemia Panel: No results for input(s): VITAMINB12, FOLATE, FERRITIN, TIBC, IRON, RETICCTPCT in the last 72 hours. Urine analysis:    Component Value Date/Time   COLORURINE YELLOW 06/30/2016 2039   APPEARANCEUR CLOUDY (A) 06/30/2016 2039   LABSPEC 1.035 (H) 06/30/2016 2039   PHURINE 7.5 06/30/2016 2039   GLUCOSEU NEGATIVE 06/30/2016 2039  HGBUR NEGATIVE 06/30/2016 2039   BILIRUBINUR NEGATIVE 06/30/2016 2039   KETONESUR 15 (A) 06/30/2016 2039   PROTEINUR 30 (A) 06/30/2016 2039   UROBILINOGEN 0.2 01/17/2015 0851   NITRITE NEGATIVE 06/30/2016 2039   LEUKOCYTESUR SMALL (A) 06/30/2016 2039   Sepsis Labs: @LABRCNTIP (procalcitonin:4,lacticidven:4)  )No results found for this or any previous visit (from the past 240 hour(s)).       Radiology Studies: Dg Chest 2 View  Result Date:  01/05/2018 CLINICAL DATA:  Chest pain and shortness of breath. History of asthma. EXAM: CHEST  2 VIEW COMPARISON:  Chest radiograph July 07, 2016 FINDINGS: Lungs are borderline hyperexpanded. No edema or consolidation. Heart size and pulmonary vascularity are normal. No adenopathy. No bone lesions. No pneumothorax. IMPRESSION: Lungs borderline hyperexpanded without edema or consolidation. Borderline hyperexpansion may be secondary to chronic asthma. Electronically Signed   By: Bretta Bang III M.D.   On: 01/05/2018 14:51        Scheduled Meds: . enoxaparin (LOVENOX) injection  40 mg Subcutaneous Q24H  . FLUoxetine  60 mg Oral Daily  . gabapentin  600 mg Oral TID  . ipratropium-albuterol  3 mL Nebulization Q6H  . methylPREDNISolone (SOLU-MEDROL) injection  60 mg Intravenous Q12H  . sodium chloride flush  3 mL Intravenous Q12H   Continuous Infusions:   LOS: 0 days    Time spent:    Zannie Cove, MD [facility] Page via [email or web address], password [email or web address] After 7PM please contact night-coverage  01/06/2018, 10:57 AM

## 2018-01-07 LAB — HIV ANTIBODY (ROUTINE TESTING W REFLEX): HIV SCREEN 4TH GENERATION: NONREACTIVE

## 2018-01-07 MED ORDER — IPRATROPIUM-ALBUTEROL 0.5-2.5 (3) MG/3ML IN SOLN
3.0000 mL | RESPIRATORY_TRACT | Status: DC
  Administered 2018-01-07 (×4): 3 mL via RESPIRATORY_TRACT
  Filled 2018-01-07 (×4): qty 3

## 2018-01-07 MED ORDER — METHYLPREDNISOLONE SODIUM SUCC 125 MG IJ SOLR
60.0000 mg | Freq: Two times a day (BID) | INTRAMUSCULAR | Status: DC
  Administered 2018-01-07 – 2018-01-08 (×2): 60 mg via INTRAVENOUS
  Filled 2018-01-07 (×2): qty 2

## 2018-01-07 MED ORDER — MOMETASONE FURO-FORMOTEROL FUM 200-5 MCG/ACT IN AERO
2.0000 | INHALATION_SPRAY | Freq: Two times a day (BID) | RESPIRATORY_TRACT | Status: DC
  Administered 2018-01-07 – 2018-01-08 (×2): 2 via RESPIRATORY_TRACT
  Filled 2018-01-07: qty 8.8

## 2018-01-07 NOTE — Care Management Note (Signed)
Case Management Note  Patient Details  Name: Kelsey [name] MRN: [id number] Date of Birth: 09-24-1981  Subjective/Objective:      Pt admitted with asthma exacerbation. She is from home.            Action/Plan: Plan is for patient to return home when medically stable. CM following.  Expected Discharge Date:                  Expected Discharge Plan:  Home/Self Care  In-House Referral:     Discharge planning Services     Post Acute Care Choice:    Choice offered to:     DME Arranged:    DME Agency:     HH Arranged:    HH Agency:     Status of Service:  In process, will continue to follow  If discussed at Long Length of Stay Meetings, dates discussed:    Additional Comments:  Kermit BaloKelli F Adalei Novell, RN 01/07/2018, 3:59 PM

## 2018-01-07 NOTE — Progress Notes (Signed)
Pt just returned from bathroom and will resume with [place]

## 2018-01-07 NOTE — Progress Notes (Signed)
PROGRESS NOTE    Kelsey [name]  WUJ:[id number] DOB: [birth year] DOA: 01/05/2018 PCP: System, Provider Not In  Brief Narrative: This 37 year old woman with history of severe persistent asthma, current every day smoker, anxiety who presented with shortness of breath. Onset was over the past 1 week, has used rescue nebulizer daily. The past 2 days she's had upper respiratory symptoms including rhinorrhea and cough.  Assessment & Plan:     Asthma exacerbation/FLu A positive -finally improving but still improving -Resp panel positive for Flu A -continue IV solumedrol, started Tamiflu -Continue nebs scheduled and PRN -Chest x-ray is unremarkable  Tobacco abuse -Counseled on admission, declined nicotine patch  Anxiety -Stopped propranolol, continue SSRI -Low-dose xanax for now  DVT prophylaxis: Lovenox Code Status: Full code Family Communication: No family at bedside Disposition Plan: Home tomorrow      Procedures:   Antimicrobials:    Subjective: -breathing getting better, some cough  Objective: Vitals:   01/07/18 0605 01/07/18 0900 01/07/18 0922 01/07/18 1129  BP: 119/78  113/76   Pulse: (!) 110  (!) 122   Resp: 20  19   Temp: 98.2 F (36.8 C)  97.7 F (36.5 C)   TempSrc: Oral  Oral   SpO2: 96% 93% 92% 94%  Weight:        Intake/Output Summary (Last 24 hours) at 01/07/2018 1259 Last data filed at 01/07/2018 1100 Gross per 24 hour  Intake 240 ml  Output -  Net 240 ml   Filed Weights   01/05/18 1346  Weight: 74.4 kg (164 lb)    Examination:  Gen: Awake, Alert, Oriented X 3, anxious appearing, no distress HEENT: PERRLA, Neck supple, no JVD Lungs: improving air movement, rare exp wheezing CVS: RRR,No Gallops,Rubs or new Murmurs Abd: soft, Non tender, non distended, BS present Extremities: No Cyanosis, Clubbing or edema Skin: no new rashes  Data Reviewed:   CBC: Recent Labs  Lab 01/05/18 1346  WBC 10.0  HGB 15.6*  HCT 46.5*  MCV 92.1  PLT  283   Basic Metabolic Panel: Recent Labs  Lab 01/05/18 1346 01/06/18 0439  NA 133* 134*  K 4.5 3.4*  CL 101 102  CO2 19* 20*  GLUCOSE 121* 137*  BUN 17 10  CREATININE 1.00 0.55  CALCIUM 8.9 8.3*   GFR: CrCl cannot be calculated (Unknown ideal weight.). Liver Function Tests: Recent Labs  Lab 01/06/18 0439  AST 19  ALT 10*  ALKPHOS 70  BILITOT 0.3  PROT 6.4*  ALBUMIN 3.3*   No results for input(s): LIPASE, AMYLASE in the last 168 hours. No results for input(s): AMMONIA in the last 168 hours. Coagulation Profile: No results for input(s): INR, PROTIME in the last 168 hours. Cardiac Enzymes: No results for input(s): CKTOTAL, CKMB, CKMBINDEX, TROPONINI in the last 168 hours. BNP (last 3 results) No results for input(s): PROBNP in the last 8760 hours. HbA1C: No results for input(s): HGBA1C in the last 72 hours. CBG: No results for input(s): GLUCAP in the last 168 hours. Lipid Profile: No results for input(s): CHOL, HDL, LDLCALC, TRIG, CHOLHDL, LDLDIRECT in the last 72 hours. Thyroid Function Tests: No results for input(s): TSH, T4TOTAL, FREET4, T3FREE, THYROIDAB in the last 72 hours. Anemia Panel: No results for input(s): VITAMINB12, FOLATE, FERRITIN, TIBC, IRON, RETICCTPCT in the last 72 hours. Urine analysis:    Component Value Date/Time   COLORURINE YELLOW 06/30/2016 2039   APPEARANCEUR CLOUDY (A) 06/30/2016 2039   LABSPEC 1.035 (H) 06/30/2016 2039   PHURINE 7.5  06/30/2016 2039   GLUCOSEU NEGATIVE 06/30/2016 2039   HGBUR NEGATIVE 06/30/2016 2039   BILIRUBINUR NEGATIVE 06/30/2016 2039   KETONESUR 15 (A) 06/30/2016 2039   PROTEINUR 30 (A) 06/30/2016 2039   UROBILINOGEN 0.2 01/17/2015 0851   NITRITE NEGATIVE 06/30/2016 2039   LEUKOCYTESUR SMALL (A) 06/30/2016 2039   Sepsis Labs: @LABRCNTIP (procalcitonin:4,lacticidven:4)  ) Recent Results (from the past 240 hour(s))  Respiratory Panel by PCR     Status: Abnormal   Collection Time: 01/06/18 12:02 AM    Result Value Ref Range Status   Adenovirus NOT DETECTED NOT DETECTED Final   Coronavirus 229E NOT DETECTED NOT DETECTED Final   Coronavirus HKU1 NOT DETECTED NOT DETECTED Final   Coronavirus NL63 NOT DETECTED NOT DETECTED Final   Coronavirus OC43 NOT DETECTED NOT DETECTED Final   Metapneumovirus NOT DETECTED NOT DETECTED Final   Rhinovirus / Enterovirus NOT DETECTED NOT DETECTED Final   Influenza A DETECTED (A) NOT DETECTED Final    Comment: NO SUBTYPE DETECTED REFERRED TO [facility] IN [place], [place] FOR IDENTIFICATION/CONFIRMATION    Influenza A H1 NOT DETECTED NOT DETECTED Final   Influenza A H1 2009 NOT DETECTED NOT DETECTED Final   Influenza A H3 NOT DETECTED NOT DETECTED Final   Influenza B NOT DETECTED NOT DETECTED Final   Parainfluenza Virus 1 NOT DETECTED NOT DETECTED Final   Parainfluenza Virus 2 NOT DETECTED NOT DETECTED Final   Parainfluenza Virus 3 NOT DETECTED NOT DETECTED Final   Parainfluenza Virus 4 NOT DETECTED NOT DETECTED Final   Respiratory Syncytial Virus NOT DETECTED NOT DETECTED Final   Bordetella pertussis NOT DETECTED NOT DETECTED Final   Chlamydophila pneumoniae NOT DETECTED NOT DETECTED Final   Mycoplasma pneumoniae NOT DETECTED NOT DETECTED Final    Comment: Performed at [facility], [address] [place], [place] [address]         Radiology Studies: Dg Chest 2 View  Result Date: 01/05/2018 CLINICAL DATA:  Chest pain and shortness of breath. History of asthma. EXAM: CHEST  2 VIEW COMPARISON:  Chest radiograph July 07, 2016 FINDINGS: Lungs are borderline hyperexpanded. No edema or consolidation. Heart size and pulmonary vascularity are normal. No adenopathy. No bone lesions. No pneumothorax. IMPRESSION: Lungs borderline hyperexpanded without edema or consolidation. Borderline hyperexpansion may be secondary to chronic asthma. Electronically Signed   By: Bretta BangWilliam  Woodruff III M.D.   On: 01/05/2018 14:51         Scheduled Meds: . benzonatate  200 mg Oral TID  . enoxaparin (LOVENOX) injection  40 mg Subcutaneous Q24H  . FLUoxetine  60 mg Oral Daily  . gabapentin  600 mg Oral TID  . ipratropium-albuterol  3 mL Nebulization Q4H  . methylPREDNISolone (SOLU-MEDROL) injection  60 mg Intravenous Q6H  . oseltamivir  75 mg Oral BID  . sodium chloride flush  3 mL Intravenous Q12H   Continuous Infusions:   LOS: 1 day    Time spent: 35min    Zannie CovePreetha Jari Dipasquale, MD [facility] Page via [email or web address], password [email or web address] After 7PM please contact night-coverage  01/07/2018, 12:59 PM

## 2018-01-08 MED ORDER — PREDNISONE 20 MG PO TABS: 20.0000 mg | ORAL_TABLET | Freq: Every day | ORAL | 0 refills | Status: DC

## 2018-01-08 MED ORDER — IPRATROPIUM-ALBUTEROL 0.5-2.5 (3) MG/3ML IN SOLN
3.0000 mL | Freq: Four times a day (QID) | RESPIRATORY_TRACT | Status: DC
  Administered 2018-01-08: 3 mL via RESPIRATORY_TRACT
  Filled 2018-01-08: qty 3

## 2018-01-08 MED ORDER — IPRATROPIUM-ALBUTEROL 0.5-2.5 (3) MG/3ML IN SOLN
3.0000 mL | RESPIRATORY_TRACT | Status: DC
  Filled 2018-01-08: qty 3

## 2018-01-08 MED ORDER — OSELTAMIVIR PHOSPHATE 75 MG PO CAPS: 75.0000 mg | ORAL_CAPSULE | Freq: Two times a day (BID) | ORAL | 0 refills | Status: DC

## 2018-01-08 MED ORDER — ALPRAZOLAM 0.5 MG PO TABS: 0.5000 mg | ORAL_TABLET | Freq: Two times a day (BID) | ORAL | 0 refills | Status: DC | PRN

## 2018-01-08 MED ORDER — HYDROCODONE-HOMATROPINE 5-1.5 MG/5ML PO SYRP: 5.0000 mL | ORAL_SOLUTION | Freq: Four times a day (QID) | ORAL | 0 refills | Status: DC | PRN

## 2018-01-08 MED ORDER — ALBUTEROL SULFATE (2.5 MG/3ML) 0.083% IN NEBU: 2.5000 mg | INHALATION_SOLUTION | RESPIRATORY_TRACT | Status: DC | PRN

## 2018-01-08 MED ORDER — PROAIR HFA 108 (90 BASE) MCG/ACT IN AERS: 1.0000 | INHALATION_SPRAY | Freq: Four times a day (QID) | RESPIRATORY_TRACT | 1 refills | Status: DC | PRN

## 2018-01-08 MED ORDER — ALBUTEROL SULFATE HFA 108 (90 BASE) MCG/ACT IN AERS
1.0000 | INHALATION_SPRAY | RESPIRATORY_TRACT | Status: DC | PRN
Start: 2018-01-08 — End: 2018-01-08

## 2018-01-08 MED ORDER — ALBUTEROL SULFATE (2.5 MG/3ML) 0.083% IN NEBU: 2.5000 mg | INHALATION_SOLUTION | RESPIRATORY_TRACT | 1 refills | Status: DC | PRN

## 2018-01-08 NOTE — Discharge Summary (Signed)
Physician Discharge Summary  Kelsey NoseJennifer L Keen ZOX:[id number] DOB: Apr 27, 1981 DOA: 01/05/2018  PCP: System, Provider Not In  Admit date: 01/05/2018 Discharge date: 01/08/2018  Time spent: 35 minutes  Recommendations for Outpatient Follow-up:  1. PCP in one week   Discharge Diagnoses:  Active Problems:   Asthma exacerbation   Flu A   Anxiety   Tobacco abuse  Discharge Condition: stable  Diet recommendation: regular  Filed Weights   01/05/18 1346  Weight: 74.4 kg (164 lb)    History of present illness:  37 year old woman with history of severe persistent asthma, current every day smoker, anxiety who presented with shortness of breath  Hospital Course:     Asthma exacerbation/FLu A positive -improving,treated with IV Solu-Medrol, Tamiflu, nebulizations scheduled and PRN -Resp panel positive for Flu A -Chest x-ray is unremarkable -Transition to prednisone taper and will complete course of Tamiflu post discharge  Tobacco abuse -Counseled on admission, declined nicotine patch  Anxiety - stopped propranolol-was taking this for anxiety which is a poor choice given history of asthma  -continue SSRI -Low-dose xanax used inpatient and discharged home on 10 pills due to increased anxiety while on prednisone, advised patient that this should not become a long-term medication    Discharge Exam: Vitals:   01/08/18 0745 01/08/18 1017  BP:  128/71  Pulse:  79  Resp:  20  Temp:  98.1 F (36.7 C)  SpO2: 98%     General: AAOx3 Cardiovascular: S1S2/RRR Respiratory: improved air movement, no expiratory wheezes noted  Discharge Instructions   Discharge Instructions    Diet general   Complete by:  As directed    Increase activity slowly   Complete by:  As directed      Allergies as of 01/08/2018      Reactions   Penicillins Rash   Has patient had a PCN reaction causing immediate rash, facial/tongue/throat swelling, SOB or lightheadedness with hypotension: No Has  patient had a PCN reaction causing severe rash involving mucus membranes or skin necrosis: No Has patient had a PCN reaction that required hospitalization No Has patient had a PCN reaction occurring within the last 10 years: No If all of the above answers are "NO", then may proceed with Cephalosporin use.      Medication List    STOP taking these medications   propranolol 10 MG tablet Commonly known as:  INDERAL     TAKE these medications   albuterol (2.5 MG/3ML) 0.083% nebulizer solution Commonly known as:  PROVENTIL Take 3 mLs (2.5 mg total) by nebulization every 4 (four) hours as needed for wheezing or shortness of breath.   PROAIR HFA 108 (90 Base) MCG/ACT inhaler Generic drug:  albuterol Inhale 1-2 puffs into the lungs every 6 (six) hours as needed for shortness of breath or wheezing.   ALPRAZolam 0.5 MG tablet Commonly known as:  XANAX Take 1 tablet (0.5 mg total) by mouth 2 (two) times daily as needed for anxiety. For 3-4days   beclomethasone 80 MCG/ACT inhaler Commonly known as:  QVAR Inhale 1 puff into the lungs 2 (two) times daily.   FLUoxetine HCl 60 MG Tabs Take 60 mg by mouth daily.   gabapentin 600 MG tablet Commonly known as:  NEURONTIN Take 600 mg by mouth 3 (three) times daily.   HYDROcodone-homatropine 5-1.5 MG/5ML syrup Commonly known as:  HYCODAN Take 5 mLs by mouth every 6 (six) hours as needed for cough.   levonorgestrel 20 MCG/24HR IUD Commonly known as:  MIRENA 1 Intra  Uterine Device (1 each total) by Intrauterine route once.   oseltamivir 75 MG capsule Commonly known as:  TAMIFLU Take 1 capsule (75 mg total) by mouth 2 (two) times daily. For 3days   predniSONE 20 MG tablet Commonly known as:  DELTASONE Take 1-2 tablets (20-40 mg total) by mouth daily. Take 40mg  daily for 2days then 20mg  daily for 2days then STOP   traZODone 100 MG tablet Commonly known as:  DESYREL Take 100 mg by mouth at bedtime as needed for sleep.      Allergies   Allergen Reactions  . Penicillins Rash    Has patient had a PCN reaction causing immediate rash, facial/tongue/throat swelling, SOB or lightheadedness with hypotension: No Has patient had a PCN reaction causing severe rash involving mucus membranes or skin necrosis: No Has patient had a PCN reaction that required hospitalization No Has patient had a PCN reaction occurring within the last 10 years: No If all of the above answers are "NO", then may proceed with Cephalosporin use.   Follow-up Information    PCP. Schedule an appointment as soon as possible for a visit in 1 week(s).            The results of significant diagnostics from this hospitalization (including imaging, microbiology, ancillary and laboratory) are listed below for reference.    Significant Diagnostic Studies: Dg Chest 2 View  Result Date: 01/05/2018 CLINICAL DATA:  Chest pain and shortness of breath. History of asthma. EXAM: CHEST  2 VIEW COMPARISON:  Chest radiograph July 07, 2016 FINDINGS: Lungs are borderline hyperexpanded. No edema or consolidation. Heart size and pulmonary vascularity are normal. No adenopathy. No bone lesions. No pneumothorax. IMPRESSION: Lungs borderline hyperexpanded without edema or consolidation. Borderline hyperexpansion may be secondary to chronic asthma. Electronically Signed   By: Bretta Bang III M.D.   On: 01/05/2018 14:51    Microbiology: Recent Results (from the past 240 hour(s))  Respiratory Panel by PCR     Status: Abnormal   Collection Time: 01/06/18 12:02 AM  Result Value Ref Range Status   Adenovirus NOT DETECTED NOT DETECTED Final   Coronavirus 229E NOT DETECTED NOT DETECTED Final   Coronavirus HKU1 NOT DETECTED NOT DETECTED Final   Coronavirus NL63 NOT DETECTED NOT DETECTED Final   Coronavirus OC43 NOT DETECTED NOT DETECTED Final   Metapneumovirus NOT DETECTED NOT DETECTED Final   Rhinovirus / Enterovirus NOT DETECTED NOT DETECTED Final   Influenza A DETECTED (A)  NOT DETECTED Final    Comment: NO SUBTYPE DETECTED REFERRED TO [facility] IN [place], [place] FOR IDENTIFICATION/CONFIRMATION    Influenza A H1 NOT DETECTED NOT DETECTED Final   Influenza A H1 2009 NOT DETECTED NOT DETECTED Final   Influenza A H3 NOT DETECTED NOT DETECTED Final   Influenza B NOT DETECTED NOT DETECTED Final   Parainfluenza Virus 1 NOT DETECTED NOT DETECTED Final   Parainfluenza Virus 2 NOT DETECTED NOT DETECTED Final   Parainfluenza Virus 3 NOT DETECTED NOT DETECTED Final   Parainfluenza Virus 4 NOT DETECTED NOT DETECTED Final   Respiratory Syncytial Virus NOT DETECTED NOT DETECTED Final   Bordetella pertussis NOT DETECTED NOT DETECTED Final   Chlamydophila pneumoniae NOT DETECTED NOT DETECTED Final   Mycoplasma pneumoniae NOT DETECTED NOT DETECTED Final    Comment: Performed at [facility], [address], [place], [place] [address]     Labs: Basic Metabolic Panel: Recent Labs  Lab 01/05/18 1346 01/06/18 0439  NA 133* 134*  K 4.5 3.4*  CL 101 102  CO2 19* 20*  GLUCOSE 121* 137*  BUN 17 10  CREATININE 1.00 0.55  CALCIUM 8.9 8.3*   Liver Function Tests: Recent Labs  Lab 01/06/18 0439  AST 19  ALT 10*  ALKPHOS 70  BILITOT 0.3  PROT 6.4*  ALBUMIN 3.3*   No results for input(s): LIPASE, AMYLASE in the last 168 hours. No results for input(s): AMMONIA in the last 168 hours. CBC: Recent Labs  Lab 01/05/18 1346  WBC 10.0  HGB 15.6*  HCT 46.5*  MCV 92.1  PLT 283   Cardiac Enzymes: No results for input(s): CKTOTAL, CKMB, CKMBINDEX, TROPONINI in the last 168 hours. BNP: BNP (last 3 results) No results for input(s): BNP in the last 8760 hours.  ProBNP (last 3 results) No results for input(s): PROBNP in the last 8760 hours.  CBG: No results for input(s): GLUCAP in the last 168 hours.     Signed:  Zannie Cove MD.  [facility] 01/08/2018, 11:33 AM

## 2018-01-08 NOTE — Progress Notes (Signed)
Patient discharged home with spouse. Discharge information given. Patient questions asked and answered. Prescriptions given. Telemetry and IV access discontinued. Patient transported from unit with volunteer services. Lawson RadarHeather M Ethelbert Thain

## 2018-01-08 NOTE — Care Management Note (Signed)
Case Management Note  Patient Details  Name: Kelsey [name] MRN: [id number] Date of Birth: September 12, 1981  Subjective/Objective:                    Action/Plan: Pt discharging home with self care. Pt has arranged a PCP and has hospital f/u appointment. Pt has insurance and transportation home. No further needs per CM.  Expected Discharge Date:  01/08/18               Expected Discharge Plan:  Home/Self Care  In-House Referral:     Discharge planning Services     Post Acute Care Choice:    Choice offered to:     DME Arranged:    DME Agency:     HH Arranged:    HH Agency:     Status of Service:  Completed, signed off  If discussed at [employer] Length of Stay Meetings, dates discussed:    Additional Comments:  Kermit BaloKelli F Antion Andres, RN 01/08/2018, 11:09 AM

## 2018-02-20 ENCOUNTER — Encounter: Payer: Self-pay | Admitting: Internal Medicine

## 2018-02-20 ENCOUNTER — Ambulatory Visit: Payer: BLUE CROSS/BLUE SHIELD | Admitting: Internal Medicine

## 2018-02-20 VITALS — BP 94/60 | HR 89 | Temp 97.7°F | Ht 64.0 in | Wt 128.4 lb

## 2018-02-20 DIAGNOSIS — Z13220 Encounter for screening for lipoid disorders: Secondary | ICD-10-CM

## 2018-02-20 DIAGNOSIS — Z1389 Encounter for screening for other disorder: Secondary | ICD-10-CM

## 2018-02-20 DIAGNOSIS — F1010 Alcohol abuse, uncomplicated: Secondary | ICD-10-CM

## 2018-02-20 DIAGNOSIS — G8929 Other chronic pain: Secondary | ICD-10-CM

## 2018-02-20 DIAGNOSIS — Z0184 Encounter for antibody response examination: Secondary | ICD-10-CM | POA: Diagnosis not present

## 2018-02-20 DIAGNOSIS — M62838 Other muscle spasm: Secondary | ICD-10-CM | POA: Diagnosis not present

## 2018-02-20 DIAGNOSIS — J4520 Mild intermittent asthma, uncomplicated: Secondary | ICD-10-CM | POA: Diagnosis not present

## 2018-02-20 DIAGNOSIS — Z720 Tobacco use: Secondary | ICD-10-CM

## 2018-02-20 DIAGNOSIS — K219 Gastro-esophageal reflux disease without esophagitis: Secondary | ICD-10-CM

## 2018-02-20 DIAGNOSIS — F32A Depression, unspecified: Secondary | ICD-10-CM

## 2018-02-20 DIAGNOSIS — Z1329 Encounter for screening for other suspected endocrine disorder: Secondary | ICD-10-CM | POA: Diagnosis not present

## 2018-02-20 DIAGNOSIS — Z23 Encounter for immunization: Secondary | ICD-10-CM

## 2018-02-20 DIAGNOSIS — N909 Noninflammatory disorder of vulva and perineum, unspecified: Secondary | ICD-10-CM | POA: Insufficient documentation

## 2018-02-20 DIAGNOSIS — F419 Anxiety disorder, unspecified: Secondary | ICD-10-CM | POA: Diagnosis not present

## 2018-02-20 DIAGNOSIS — F329 Major depressive disorder, single episode, unspecified: Secondary | ICD-10-CM

## 2018-02-20 DIAGNOSIS — J309 Allergic rhinitis, unspecified: Secondary | ICD-10-CM | POA: Diagnosis not present

## 2018-02-20 DIAGNOSIS — Z1159 Encounter for screening for other viral diseases: Secondary | ICD-10-CM

## 2018-02-20 MED ORDER — FLUOXETINE HCL 60 MG PO TABS: 60.0000 mg | ORAL_TABLET | Freq: Every day | ORAL | 11 refills | Status: DC

## 2018-02-20 MED ORDER — LORAZEPAM 0.5 MG PO TABS: 0.5000 mg | ORAL_TABLET | Freq: Every day | ORAL | 2 refills | Status: DC | PRN

## 2018-02-20 MED ORDER — GABAPENTIN 400 MG PO CAPS
400.0000 mg | ORAL_CAPSULE | Freq: Three times a day (TID) | ORAL | 1 refills | Status: DC
Start: 2018-02-20 — End: 2018-04-05

## 2018-02-20 MED ORDER — PROAIR HFA 108 (90 BASE) MCG/ACT IN AERS: 1.0000 | INHALATION_SPRAY | Freq: Four times a day (QID) | RESPIRATORY_TRACT | 11 refills | Status: DC | PRN

## 2018-02-20 MED ORDER — CYCLOBENZAPRINE HCL 5 MG PO TABS: 5.0000 mg | ORAL_TABLET | Freq: Every evening | ORAL | 2 refills | Status: DC | PRN

## 2018-02-20 MED ORDER — MONTELUKAST SODIUM 10 MG PO TABS: 10.0000 mg | ORAL_TABLET | Freq: Every day | ORAL | 3 refills | Status: DC

## 2018-02-20 NOTE — Progress Notes (Signed)
Pre visit review using our clinic review tool, if applicable. No additional management support is needed unless otherwise documented below in the visit note. 

## 2018-02-20 NOTE — Patient Instructions (Signed)
F/u in 2 months   Generalized Anxiety Disorder, Adult Generalized anxiety disorder (GAD) is a mental health disorder. People with this condition constantly worry about everyday events. Unlike normal anxiety, worry related to GAD is not triggered by a specific event. These worries also do not fade or get better with time. GAD interferes with life functions, including relationships, work, and school. GAD can vary from mild to severe. People with severe GAD can have intense waves of anxiety with physical symptoms (panic attacks). What are the causes? The exact cause of GAD is not known. What increases the risk? This condition is more likely to develop in:  Women.  People who have a family history of anxiety disorders.  People who are very shy.  People who experience very stressful life events, such as the death of a loved one.  People who have a very stressful family environment.  What are the signs or symptoms? People with GAD often worry excessively about many things in their lives, such as their health and family. They may also be overly concerned about:  Doing well at work.  Being on time.  Natural disasters.  Friendships.  Physical symptoms of GAD include:  Fatigue.  Muscle tension or having muscle twitches.  Trembling or feeling shaky.  Being easily startled.  Feeling like your heart is pounding or racing.  Feeling out of breath or like you cannot take a deep breath.  Having trouble falling asleep or staying asleep.  Sweating.  Nausea, diarrhea, or irritable bowel syndrome (IBS).  Headaches.  Trouble concentrating or remembering facts.  Restlessness.  Irritability.  How is this diagnosed? Your health care provider can diagnose GAD based on your symptoms and medical history. You will also have a physical exam. The health care provider will ask specific questions about your symptoms, including how severe they are, when they started, and if they come and  go. Your health care provider may ask you about your use of alcohol or drugs, including prescription medicines. Your health care provider may refer you to a mental health specialist for further evaluation. Your health care provider will do a thorough examination and may perform additional tests to rule out other possible causes of your symptoms. To be diagnosed with GAD, a person must have anxiety that:  Is out of his or her control.  Affects several different aspects of his or her life, such as work and relationships.  Causes distress that makes him or her unable to take part in normal activities.  Includes at least three physical symptoms of GAD, such as restlessness, fatigue, trouble concentrating, irritability, muscle tension, or sleep problems.  Before your health care provider can confirm a diagnosis of GAD, these symptoms must be present more days than they are not, and they must last for six months or longer. How is this treated? The following therapies are usually used to treat GAD:  Medicine. Antidepressant medicine is usually prescribed for long-term daily control. Antianxiety medicines may be added in severe cases, especially when panic attacks occur.  Talk therapy (psychotherapy). Certain types of talk therapy can be helpful in treating GAD by providing support, education, and guidance. Options include: ? Cognitive behavioral therapy (CBT). People learn coping skills and techniques to ease their anxiety. They learn to identify unrealistic or negative thoughts and behaviors and to replace them with positive ones. ? Acceptance and commitment therapy (ACT). This treatment teaches people how to be mindful as a way to cope with unwanted thoughts and feelings. ?  Biofeedback. This process trains you to manage your body's response (physiological response) through breathing techniques and relaxation methods. You will work with a therapist while machines are used to monitor your physical  symptoms.  Stress management techniques. These include yoga, meditation, and exercise.  A mental health specialist can help determine which treatment is best for you. Some people see improvement with one type of therapy. However, other people require a combination of therapies. Follow these instructions at home:  Take over-the-counter and prescription medicines only as told by your health care provider.  Try to maintain a normal routine.  Try to anticipate stressful situations and allow extra time to manage them.  Practice any stress management or self-calming techniques as taught by your health care provider.  Do not punish yourself for setbacks or for not making progress.  Try to recognize your accomplishments, even if they are small.  Keep all follow-up visits as told by your health care provider. This is important. Contact a health care provider if:  Your symptoms do not get better.  Your symptoms get worse.  You have signs of depression, such as: ? A persistently sad, cranky, or irritable mood. ? Loss of enjoyment in activities that used to bring you joy. ? Change in weight or eating. ? Changes in sleeping habits. ? Avoiding friends or family members. ? Loss of energy for normal tasks. ? Feelings of guilt or worthlessness. Get help right away if:  You have serious thoughts about hurting yourself or others. If you ever feel like you may hurt yourself or others, or have thoughts about taking your own life, get help right away. You can go to your nearest emergency department or call:  Your local emergency services (911 in the [place]  A suicide crisis helpline, such as the National Suicide Prevention Lifeline at 4328790282. This is open 24 hours a day.  Summary  Generalized anxiety disorder (GAD) is a mental health disorder that involves worry that is not triggered by a specific event.  People with GAD often worry excessively about many things in their lives, such  as their health and family.  GAD may cause physical symptoms such as restlessness, trouble concentrating, sleep problems, frequent sweating, nausea, diarrhea, headaches, and trembling or muscle twitching.  A mental health specialist can help determine which treatment is best for you. Some people see improvement with one type of therapy. However, other people require a combination of therapies. This information is not intended to replace advice given to you by your health care provider. Make sure you discuss any questions you have with your health care provider. Document Released: 03/10/2013 Document Revised: 10/03/2016 Document Reviewed: 10/03/2016 Elsevier Interactive Patient Education  2018 [employer].  Asthma, Adult Asthma is a recurring condition in which the airways tighten and narrow. Asthma can make it difficult to breathe. It can cause coughing, wheezing, and shortness of breath. Asthma episodes, also called asthma attacks, range from minor to life-threatening. Asthma cannot be cured, but medicines and lifestyle changes can help control it. What are the causes? Asthma is believed to be caused by inherited (genetic) and environmental factors, but its exact cause is unknown. Asthma may be triggered by allergens, lung infections, or irritants in the air. Asthma triggers are different for each person. Common triggers include:  Animal dander.  Dust mites.  Cockroaches.  Pollen from trees or grass.  Mold.  Smoke.  Air pollutants such as dust, household cleaners, hair sprays, aerosol sprays, paint fumes, strong chemicals, or strong  odors.  Cold air, weather changes, and winds (which increase molds and pollens in the air).  Strong emotional expressions such as crying or laughing hard.  Stress.  Certain medicines (such as aspirin) or types of drugs (such as beta-blockers).  Sulfites in foods and drinks. Foods and drinks that may contain sulfites include dried fruit, potato  chips, and sparkling grape juice.  Infections or inflammatory conditions such as the flu, a cold, or an inflammation of the nasal membranes (rhinitis).  Gastroesophageal reflux disease (GERD).  Exercise or strenuous activity.  What are the signs or symptoms? Symptoms may occur immediately after asthma is triggered or many hours later. Symptoms include:  Wheezing.  Excessive nighttime or early morning coughing.  Frequent or severe coughing with a common cold.  Chest tightness.  Shortness of breath.  How is this diagnosed? The diagnosis of asthma is made by a review of your medical history and a physical exam. Tests may also be performed. These may include:  Lung function studies. These tests show how much air you breathe in and out.  Allergy tests.  Imaging tests such as X-rays.  How is this treated? Asthma cannot be cured, but it can usually be controlled. Treatment involves identifying and avoiding your asthma triggers. It also involves medicines. There are 2 classes of medicine used for asthma treatment:  Controller medicines. These prevent asthma symptoms from occurring. They are usually taken every day.  Reliever or rescue medicines. These quickly relieve asthma symptoms. They are used as needed and provide short-term relief.  Your health care provider will help you create an asthma action plan. An asthma action plan is a written plan for managing and treating your asthma attacks. It includes a list of your asthma triggers and how they may be avoided. It also includes information on when medicines should be taken and when their dosage should be changed. An action plan may also involve the use of a device called a peak flow meter. A peak flow meter measures how well the lungs are working. It helps you monitor your condition. Follow these instructions at home:  Take medicines only as directed by your health care provider. Speak with your health care provider if you have  questions about how or when to take the medicines.  Use a peak flow meter as directed by your health care provider. Record and keep track of readings.  Understand and use the action plan to help minimize or stop an asthma attack without needing to seek medical care.  Control your home environment in the following ways to help prevent asthma attacks: ? Do not smoke. Avoid being exposed to secondhand smoke. ? Change your heating and air conditioning filter regularly. ? Limit your use of fireplaces and wood stoves. ? Get rid of pests (such as roaches and mice) and their droppings. ? Throw away plants if you see mold on them. ? Clean your floors and dust regularly. Use unscented cleaning products. ? Try to have someone else vacuum for you regularly. Stay out of rooms while they are being vacuumed and for a short while afterward. If you vacuum, use a dust mask from a hardware store, a double-layered or microfilter vacuum cleaner bag, or a vacuum cleaner with a HEPA filter. ? Replace carpet with wood, tile, or vinyl flooring. Carpet can trap dander and dust. ? Use allergy-proof pillows, mattress covers, and box spring covers. ? Wash bed sheets and blankets every week in hot water and dry them in a dryer. ?  Use blankets that are made of polyester or cotton. ? Clean bathrooms and kitchens with bleach. If possible, have someone repaint the walls in these rooms with mold-resistant paint. Keep out of the rooms that are being cleaned and painted. ? Wash hands frequently. Contact a health care provider if:  You have wheezing, shortness of breath, or a cough even if taking medicine to prevent attacks.  The colored mucus you cough up (sputum) is thicker than usual.  Your sputum changes from clear or white to yellow, green, gray, or bloody.  You have any problems that may be related to the medicines you are taking (such as a rash, itching, swelling, or trouble breathing).  You are using a reliever  medicine more than 2-3 times per week.  Your peak flow is still at 50-79% of your personal best after following your action plan for 1 hour.  You have a fever. Get help right away if:  You seem to be getting worse and are unresponsive to treatment during an asthma attack.  You are short of breath even at rest.  You get short of breath when doing very little physical activity.  You have difficulty eating, drinking, or talking due to asthma symptoms.  You develop chest pain.  You develop a fast heartbeat.  You have a bluish color to your lips or fingernails.  You are light-headed, dizzy, or faint.  Your peak flow is less than 50% of your personal best. This information is not intended to replace advice given to you by your health care provider. Make sure you discuss any questions you have with your health care provider. Document Released: 11/13/2005 Document Revised: 04/26/2016 Document Reviewed: 06/12/2013 Elsevier Interactive Patient Education  2017 [employer].

## 2018-02-22 ENCOUNTER — Encounter: Payer: Self-pay | Admitting: Internal Medicine

## 2018-02-22 MED ORDER — FLUTICASONE FUROATE-VILANTEROL 100-25 MCG/INH IN AEPB: 1.0000 | INHALATION_SPRAY | Freq: Every day | RESPIRATORY_TRACT | 0 refills | Status: DC

## 2018-02-22 NOTE — Progress Notes (Addendum)
Chief Complaint  Patient presents with  . New Patient (Initial Visit)   New patient prior PCP [facility] in [place]  1. C/o rhomboid shoulder pain shoulder feels tight on the right. This is chronic issues. Requests medication to help I.e muscle relaxer  2. H/o asthma triggers weather changes and animals and stress. She was on Qvar in the past w/o help. Controlled for now. Unable to afford inhaler  3. Anxiety 2/2 being separated from husband and 2 kids since 06/2017 she lives in [place] and they live in [place]. Anxiety is so bad she has panic attacks at times where she is unable to breath. She has tried Propranolol in the past which makes her asthma worse. Buspar did not provide help, hydroxyzine pt is unsure if if helped either. She was seeing Dr. Toy Care in the past on Xanax 0.5-1 mg tid prn . She is on Prozac 60 mg x 2-3 months  4. Ho alcohol abuse and has [facility] and still sees a counselor there   Review of Systems  Constitutional: Negative for weight loss.  HENT: Negative for hearing loss.   Eyes: Negative for blurred vision.  Respiratory: Negative for shortness of breath.   Cardiovascular: Negative for chest pain.  Gastrointestinal: Negative for abdominal pain.  Musculoskeletal: Positive for joint pain.  Skin: Negative for rash.  Neurological: Negative for headaches.  Psychiatric/Behavioral: Positive for depression. The patient is nervous/anxious.    Past Medical History:  Diagnosis Date  . Anxiety   . Asthma   . Chronic bronchitis ([place])    "get it pretty much q yr" (12/13/2015)  . Depression   . Depression   . GERD (gastroesophageal reflux disease)   . Insomnia   . Palpitations   . Pneumonia    "multiple times when I was a child; once as an adult so far" (12/13/2015)  . Pre-syncope   . PVC (premature ventricular contraction)   . Seasonal allergies   . Tachycardia    Past Surgical History:  Procedure Laterality Date  . CESAREAN SECTION   10/15/2009, 02/08/2006  . INCISION AND DRAINAGE PERIRECTAL ABSCESS Bilateral 11/22/2016   Procedure: IRRIGATION AND DEBRIDEMENT PERIRECTAL ABSCESS;  Surgeon: [name], MD;  Location: [facility];  Service: General;  Laterality: Bilateral;   Family History  Problem Relation Age of Onset  . Heart disease Father   . Rheumatologic disease Mother   . Arthritis Mother   . Asthma Mother   . Depression Mother   . Lung cancer Paternal Grandmother   . Asthma Maternal Grandmother   . Hypertension Maternal Grandmother   . Heart disease Maternal Grandfather    Social History   Socioeconomic History  . Marital status: Married    Spouse name: Not on file  . Number of children: 2  . Years of education: Not on file  . Highest education level: Not on file  Occupational History  . Occupation: respiratory therapists    Employer: [employer]  Social Needs  . Financial resource strain: Not on file  . Food insecurity:    Worry: Not on file    Inability: Not on file  . Transportation needs:    Medical: Not on file    Non-medical: Not on file  Tobacco Use  . Smoking status: Current Every Day Smoker    Types: Cigarettes  . Smokeless tobacco: Never Used  Substance and Sexual Activity  . Alcohol use: No  . Drug use: No  . Sexual activity:  Not on file  Lifestyle  . Physical activity:    Days per week: Not on file    Minutes per session: Not on file  . Stress: Not on file  Relationships  . Social connections:    Talks on phone: Not on file    Gets together: Not on file    Attends religious service: Not on file    Active member of club or organization: Not on file    Attends meetings of clubs or organizations: Not on file    Relationship status: Not on file  . Intimate partner violence:    Fear of current or ex partner: Not on file    Emotionally abused: Not on file    Physically abused: Not on file    Forced sexual activity: Not on file  Other Topics Concern  . Not on file  Social  History Narrative   Higher education careers adviser ed    2 kids    Married though separated from husband    Wears seat belt, feels safe in relationship    Current Meds  Medication Sig  . FLUoxetine HCl 60 MG TABS Take 60 mg by mouth daily.  Marland Kitchen gabapentin (NEURONTIN) 400 MG capsule Take 1 capsule (400 mg total) by mouth 3 (three) times daily.  Marland Kitchen levonorgestrel (MIRENA) 20 MCG/24HR IUD 1 Intra Uterine Device (1 each total) by Intrauterine route once.  Marland Kitchen PROAIR HFA 108 (90 Base) MCG/ACT inhaler Inhale 1-2 puffs into the lungs every 6 (six) hours as needed for shortness of breath or wheezing.  . traZODone (DESYREL) 50 MG tablet Take 50 mg by mouth at bedtime as needed for sleep.   . [DISCONTINUED] FLUoxetine HCl 60 MG TABS Take 60 mg by mouth daily.  . [DISCONTINUED] gabapentin (NEURONTIN) 400 MG capsule Take 600 mg by mouth 3 (three) times daily.  . [DISCONTINUED] PROAIR HFA 108 (90 Base) MCG/ACT inhaler Inhale 1-2 puffs into the lungs every 6 (six) hours as needed for shortness of breath or wheezing.   Allergies  Allergen Reactions  . Penicillins Rash    Has patient had a PCN reaction causing immediate rash, facial/tongue/throat swelling, SOB or lightheadedness with hypotension: No Has patient had a PCN reaction causing severe rash involving mucus membranes or skin necrosis: No Has patient had a PCN reaction that required hospitalization No Has patient had a PCN reaction occurring within the last 10 years: No If all of the above answers are "NO", then may proceed with Cephalosporin use.   Recent Results (from the past 2160 hour(s))  Basic metabolic panel     Status: Abnormal   Collection Time: 01/05/18  1:46 PM  Result Value Ref Range   Sodium 133 (L) 135 - 145 mmol/L   Potassium 4.5 3.5 - 5.1 mmol/L   Chloride 101 101 - 111 mmol/L   CO2 19 (L) 22 - 32 mmol/L   Glucose, Bld 121 (H) 65 - 99 mg/dL   BUN 17 6 - 20 mg/dL   Creatinine, Ser 1.00 0.44 - 1.00 mg/dL   Calcium 8.9 8.9 -  10.3 mg/dL   GFR calc non Af Amer >60 >60 mL/min   GFR calc Af Amer >60 >60 mL/min    Comment: (NOTE) The eGFR has been calculated using the CKD EPI equation. This calculation has not been validated in all clinical situations. eGFR's persistently <60 mL/min signify possible Chronic Kidney Disease.    Anion gap 13 5 - 15    Comment: Performed  at [facility], [place] [address] [place], [place] [address]  CBC     Status: Abnormal   Collection Time: 01/05/18  1:46 PM  Result Value Ref Range   WBC 10.0 4.0 - 10.5 K/uL   RBC 5.05 3.87 - 5.11 MIL/uL   Hemoglobin 15.6 (H) 12.0 - 15.0 g/dL   HCT 46.5 (H) 36.0 - 46.0 %   MCV 92.1 78.0 - 100.0 fL   MCH 30.9 26.0 - 34.0 pg   MCHC 33.5 30.0 - 36.0 g/dL   RDW 12.6 11.5 - 15.5 %   Platelets 283 150 - 400 K/uL    Comment: Performed at [facility] [address] [place], [place] [address]  I-stat troponin, ED     Status: None   Collection Time: 01/05/18  2:10 PM  Result Value Ref Range   Troponin i, poc 0.00 0.00 - 0.08 ng/mL   Comment 3            Comment: Due to the release kinetics of cTnI, a negative result within the first hours of the onset of symptoms does not rule out myocardial infarction with certainty. If myocardial infarction is still suspected, repeat the test at appropriate intervals.   I-Stat beta hCG blood, ED     Status: None   Collection Time: 01/05/18  2:10 PM  Result Value Ref Range   I-stat hCG, quantitative <5.0 <5 mIU/mL   Comment 3            Comment:   GEST. AGE      CONC.  (mIU/mL)   <=1 WEEK        5 - 50     2 WEEKS       50 - 500     3 WEEKS       100 - 10,000     4 WEEKS     1,000 - 30,000        FEMALE AND NON-PREGNANT FEMALE:     LESS THAN 5 mIU/mL   Respiratory Panel by PCR     Status: Abnormal   Collection Time: 01/06/18 12:02 AM  Result Value Ref Range   Adenovirus NOT DETECTED NOT DETECTED   Coronavirus 229E NOT DETECTED NOT DETECTED   Coronavirus HKU1 NOT DETECTED NOT DETECTED    Coronavirus NL63 NOT DETECTED NOT DETECTED   Coronavirus OC43 NOT DETECTED NOT DETECTED   Metapneumovirus NOT DETECTED NOT DETECTED   Rhinovirus / Enterovirus NOT DETECTED NOT DETECTED   Influenza A DETECTED (A) NOT DETECTED    Comment: NO SUBTYPE DETECTED REFERRED TO [facility] IN [place] IDENTIFICATION/CONFIRMATION    Influenza A H1 NOT DETECTED NOT DETECTED   Influenza A H1 2009 NOT DETECTED NOT DETECTED   Influenza A H3 NOT DETECTED NOT DETECTED   Influenza B NOT DETECTED NOT DETECTED   Parainfluenza Virus 1 NOT DETECTED NOT DETECTED   Parainfluenza Virus 2 NOT DETECTED NOT DETECTED   Parainfluenza Virus 3 NOT DETECTED NOT DETECTED   Parainfluenza Virus 4 NOT DETECTED NOT DETECTED   Respiratory Syncytial Virus NOT DETECTED NOT DETECTED   Bordetella pertussis NOT DETECTED NOT DETECTED   Chlamydophila pneumoniae NOT DETECTED NOT DETECTED   Mycoplasma pneumoniae NOT DETECTED NOT DETECTED    Comment: Performed at [facility], [place] [address] [place], [place] [address]  HIV antibody (Routine Testing)     Status: None   Collection Time: 01/06/18  4:39 AM  Result Value Ref Range   HIV Screen 4th Generation wRfx Non Reactive Non Reactive    Comment: (NOTE) Performed At: [facility] [place], [place] [id number] Rush Farmer MD WY:[id number] Performed at [facility], [place] [address] [place], [place] [address]   Comprehensive metabolic panel     Status: Abnormal   Collection Time: 01/06/18  4:39 AM  Result Value Ref Range   Sodium 134 (L) 135 - 145 mmol/L   Potassium 3.4 (L) 3.5 - 5.1 mmol/L   Chloride 102 101 - 111 mmol/L   CO2 20 (L) 22 - 32 mmol/L   Glucose, Bld 137 (H) 65 - 99 mg/dL   BUN 10 6 - 20 mg/dL   Creatinine, Ser 0.55 0.44 - 1.00 mg/dL   Calcium 8.3 (L) 8.9 - 10.3 mg/dL   Total Protein 6.4 (L) 6.5 - 8.1 g/dL   Albumin 3.3 (L) 3.5 - 5.0 g/dL   AST 19 15 - 41 U/L   ALT 10 (L) 14 - 54 U/L    Alkaline Phosphatase 70 38 - 126 U/L   Total Bilirubin 0.3 0.3 - 1.2 mg/dL   GFR calc non Af Amer >60 >60 mL/min   GFR calc Af Amer >60 >60 mL/min    Comment: (NOTE) The eGFR has been calculated using the CKD EPI equation. This calculation has not been validated in all clinical situations. eGFR's persistently <60 mL/min signify possible Chronic Kidney Disease.    Anion gap 12 5 - 15    Comment: Performed at [facility] [address], [place], [place] [address]   Objective  Body mass index is 22.04 kg/m. Wt Readings from Last 3 Encounters:  02/20/18 128 lb 6.4 oz (58.2 kg)  01/05/18 164 lb (74.4 kg)  01/19/17 164 lb 12.8 oz (74.8 kg)   Temp Readings from Last 3 Encounters:  02/20/18 97.7 F (36.5 C) (Oral)  01/08/18 98.1 F (36.7 C) (Oral)  04/23/17 98.2 F (36.8 C) (Oral)   BP Readings from Last 3 Encounters:  02/20/18 94/60  01/08/18 128/71  04/24/17 121/80   Pulse Readings from Last 3 Encounters:  02/20/18 89  01/08/18 79  04/24/17 64    Physical Exam  Constitutional: She is oriented to person, place, and time and well-developed, well-nourished, and in no distress. Vital signs are normal.  HENT:  Head: Normocephalic.  Mouth/Throat: Oropharynx is clear and moist and mucous membranes are normal.  Eyes: Pupils are equal, round, and reactive to light. Conjunctivae are normal.  Cardiovascular: Normal rate, regular rhythm and normal heart sounds.  Pulmonary/Chest: Effort normal and breath sounds normal.  Musculoskeletal:       Right shoulder: She exhibits tenderness.       Left shoulder: She exhibits tenderness.  Muscle spasms b/l shoulders  Neurological: She is alert and oriented to person, place, and time. Gait normal. Gait normal.  Skin: Skin is warm, dry and intact.  Psychiatric: Mood, memory, affect and judgment normal.  Nursing note and vitals reviewed.   Assessment   1. B/l shoulder pain and spasms  2. Asthma controlled  3.  Anxiety/panic/depression uncontrolled  4. H/o alcohol and tobacco abuse. Still smoking 1/2 ppd (down from 1 ppd) since early 20s on and off sober since 13 months  5. HM Plan  1. Prn Flexeril  2. Prn proair  Given sample of Breo 100 to try qd ex 8/19 lot [id number]  Trial of singulair  rec smoking cessation  Given pna  23 vx today  3. prozac 60 mg qd  Prn Ativan 0.5 qd prn disc temp supply not long term  4. Congratulated on sobriety  rec smoking cessation  5.  UTD flu, pna 23 given today, had prevnar  Tdap per pt had in 2017   Pap [facility] no h/o abnormal needed to get records requested today  -pap 10/13/13 neg HPV neg   Provider: Dr. Olivia Mackie McLean-Scocuzza-Internal Medicine

## 2018-04-04 ENCOUNTER — Telehealth: Payer: Self-pay | Admitting: Internal Medicine

## 2018-04-04 NOTE — Telephone Encounter (Signed)
Copied from CRM [id number]. Topic: Quick Communication - Rx Refill/Question >> Apr 04, 2018  9:25 AM Herby Abraham C wrote: Medication: gabapentin (NEURONTIN) 600 MG capsule-- pt is requesting  instead of 400. Pt says that she and PCP discussed in ov  Has the patient contacted their pharmacy?   (Agent: If no, request that the patient contact the pharmacy for the refill.)  Preferred Pharmacy (with phone number or street name): [id number] [place], [place] - [address] [phone] (Phone) [phone] (Fax)     Agent: Please be advised that RX refills may take up to 3 business days. We ask that you follow-up with your pharmacy.

## 2018-04-05 ENCOUNTER — Other Ambulatory Visit: Payer: Self-pay | Admitting: Internal Medicine

## 2018-04-05 DIAGNOSIS — G8929 Other chronic pain: Secondary | ICD-10-CM

## 2018-04-05 MED ORDER — GABAPENTIN 300 MG PO CAPS: 600.0000 mg | ORAL_CAPSULE | Freq: Three times a day (TID) | ORAL | 2 refills | Status: DC

## 2018-04-05 NOTE — Telephone Encounter (Signed)
Please advise 

## 2018-04-05 NOTE — Telephone Encounter (Signed)
Patient is requesting an increase in her dosage of Neurontin- for provider review.

## 2018-04-15 ENCOUNTER — Telehealth: Payer: Self-pay

## 2018-04-15 ENCOUNTER — Other Ambulatory Visit (INDEPENDENT_AMBULATORY_CARE_PROVIDER_SITE_OTHER): Payer: BLUE CROSS/BLUE SHIELD

## 2018-04-15 DIAGNOSIS — F419 Anxiety disorder, unspecified: Secondary | ICD-10-CM | POA: Diagnosis not present

## 2018-04-15 DIAGNOSIS — F329 Major depressive disorder, single episode, unspecified: Secondary | ICD-10-CM

## 2018-04-15 DIAGNOSIS — Z0184 Encounter for antibody response examination: Secondary | ICD-10-CM

## 2018-04-15 DIAGNOSIS — Z13220 Encounter for screening for lipoid disorders: Secondary | ICD-10-CM

## 2018-04-15 DIAGNOSIS — F32A Depression, unspecified: Secondary | ICD-10-CM

## 2018-04-15 DIAGNOSIS — F1010 Alcohol abuse, uncomplicated: Secondary | ICD-10-CM

## 2018-04-15 DIAGNOSIS — Z1159 Encounter for screening for other viral diseases: Secondary | ICD-10-CM

## 2018-04-15 DIAGNOSIS — Z1329 Encounter for screening for other suspected endocrine disorder: Secondary | ICD-10-CM

## 2018-04-15 NOTE — Telephone Encounter (Signed)
Fax has been sent to center.

## 2018-04-15 NOTE — Addendum Note (Signed)
Addended by: Penne Lash on: 04/15/2018 01:00 PM   Modules accepted: Orders

## 2018-04-15 NOTE — Telephone Encounter (Signed)
Copied from CRM [id number]. Topic: General - Other >> Apr 15, 2018  1:53 PM Arlyss Gandy, NT wrote: Reason for CRM: Pt is at the [facility] in [place] and is needing a note that states she was in the office today for lab work faxed to them. Fax#: [phone]. She would like to see if this can be faxed as soon as possible. Please call pt once done.

## 2018-04-16 LAB — HEPATITIS B SURFACE ANTIBODY, QUANTITATIVE: Hepatitis B-Post: >1000 m[IU]/mL (ref >=10)

## 2018-04-16 LAB — CBC WITH DIFFERENTIAL/PLATELET
BASOS PCT: 0.7 %
Basophils Absolute: 39 cells/uL (ref 0–200)
EOS ABS: 437 {cells}/uL (ref 15–500)
Eosinophils Relative: 7.8 %
HCT: 37.8 % (ref 35.0–45.0)
Hemoglobin: 12.7 g/dL (ref 11.7–15.5)
LYMPHS ABS: 1540 {cells}/uL (ref 850–3900)
MCH: 30.4 pg (ref 27.0–33.0)
MCHC: 33.6 g/dL (ref 32.0–36.0)
MCV: 90.4 fL (ref 80.0–100.0)
MPV: 11.2 fL (ref 7.5–12.5)
Monocytes Relative: 7.6 %
NEUTROS ABS: 3158 {cells}/uL (ref 1500–7800)
Neutrophils Relative %: 56.4 %
PLATELETS: 296 10*3/uL (ref 140–400)
RBC: 4.18 10*6/uL (ref 3.80–5.10)
RDW: 12.1 % (ref 11.0–15.0)
Total Lymphocyte: 27.5 %
WBC: 5.6 10*3/uL (ref 3.8–10.8)
WBCMIX: 426 {cells}/uL (ref 200–950)

## 2018-04-16 LAB — BASIC METABOLIC PANEL
BUN: 12 mg/dL (ref 7–25)
CALCIUM: 8.6 mg/dL (ref 8.6–10.2)
CO2: 22 mmol/L (ref 20–32)
CREATININE: 0.7 mg/dL (ref 0.50–1.10)
Chloride: 107 mmol/L (ref 98–110)
GLUCOSE: 94 mg/dL (ref 65–99)
POTASSIUM: 4.5 mmol/L (ref 3.5–5.3)
Sodium: 139 mmol/L (ref 135–146)

## 2018-04-16 LAB — LIPID PANEL
CHOL/HDL RATIO: 2.8 (calc) (ref <5.0)
CHOLESTEROL: 124 mg/dL (ref <200)
HDL: 44 mg/dL — ABNORMAL LOW (ref >50)
LDL CHOLESTEROL (CALC): 66 mg/dL
Non-HDL Cholesterol (Calc): 80 mg/dL (calc) (ref <130)
Triglycerides: 59 mg/dL (ref <150)

## 2018-04-16 LAB — TSH: TSH: 1.92 mIU/L

## 2018-04-16 LAB — T4, FREE: Free T4: 1 ng/dL (ref 0.8–1.8)

## 2018-04-26 ENCOUNTER — Encounter: Payer: Self-pay | Admitting: Internal Medicine

## 2018-04-26 ENCOUNTER — Ambulatory Visit (INDEPENDENT_AMBULATORY_CARE_PROVIDER_SITE_OTHER): Payer: BLUE CROSS/BLUE SHIELD | Admitting: Internal Medicine

## 2018-04-26 VITALS — BP 126/84 | HR 91 | Temp 97.9°F | Ht 64.0 in | Wt 132.0 lb

## 2018-04-26 DIAGNOSIS — F411 Generalized anxiety disorder: Secondary | ICD-10-CM

## 2018-04-26 DIAGNOSIS — F331 Major depressive disorder, recurrent, moderate: Secondary | ICD-10-CM | POA: Diagnosis not present

## 2018-04-26 DIAGNOSIS — F419 Anxiety disorder, unspecified: Secondary | ICD-10-CM

## 2018-04-26 DIAGNOSIS — F430 Acute stress reaction: Secondary | ICD-10-CM

## 2018-04-26 DIAGNOSIS — F329 Major depressive disorder, single episode, unspecified: Secondary | ICD-10-CM

## 2018-04-26 DIAGNOSIS — F410 Panic disorder [episodic paroxysmal anxiety] without agoraphobia: Secondary | ICD-10-CM | POA: Diagnosis not present

## 2018-04-26 DIAGNOSIS — J4520 Mild intermittent asthma, uncomplicated: Secondary | ICD-10-CM | POA: Diagnosis not present

## 2018-04-26 DIAGNOSIS — F32A Depression, unspecified: Secondary | ICD-10-CM

## 2018-04-26 MED ORDER — ALPRAZOLAM 0.5 MG PO TABS: 0.5000 mg | ORAL_TABLET | Freq: Every evening | ORAL | 1 refills | Status: DC | PRN

## 2018-04-26 MED ORDER — ALPRAZOLAM 0.5 MG PO TABS: 0.5000 mg | ORAL_TABLET | Freq: Two times a day (BID) | ORAL | 1 refills | Status: DC | PRN

## 2018-04-26 NOTE — Progress Notes (Signed)
Chief Complaint  Patient presents with  . Follow-up   F/u  Anxiety/panic depression ativan 0.5 qd not working phq 9 score 9 today stressor in life boyfriend just got served papers for his son She may move to [place] 05/2018 if she gets a new job prev psych Dr. Helane Rima in [place]. We tried increasing gabapentin to 600 mg tid and this is also not helping. She took Xanax in the past which seem to help with anxiety. She is tearful today on exam   Asthma breo helped but cant afford Rx  Review of Systems  Constitutional: Negative for weight loss.  HENT: Negative for hearing loss.   Respiratory: Negative for cough.   Cardiovascular: Positive for palpitations. Negative for chest pain.  Skin: Negative for rash.  Psychiatric/Behavioral: The patient is nervous/anxious.    Past Medical History:  Diagnosis Date  . Anxiety   . Asthma    hosp. 2018 and 2019 no h/o intubation   . Chronic bronchitis ([facility])    "get it pretty much q yr" (12/13/2015)  . Depression   . Depression   . GERD (gastroesophageal reflux disease)   . Insomnia   . Palpitations   . Pneumonia    "multiple times when I was a child; once as an adult so far" (12/13/2015)  . Pre-syncope   . PVC (premature ventricular contraction)   . Seasonal allergies   . Tachycardia    Past Surgical History:  Procedure Laterality Date  . CESAREAN SECTION  10/15/2009, 02/08/2006  . INCISION AND DRAINAGE PERIRECTAL ABSCESS Bilateral 11/22/2016   Procedure: IRRIGATION AND DEBRIDEMENT PERIRECTAL ABSCESS;  Surgeon: Ovidio Kin, MD;  Location: [facility];  Service: General;  Laterality: Bilateral;   Family History  Problem Relation Age of Onset  . Heart disease Father   . Rheumatologic disease Mother   . Arthritis Mother   . Asthma Mother   . Depression Mother   . Lung cancer Paternal Grandmother   . Asthma Maternal Grandmother   . Hypertension Maternal Grandmother   . Heart disease Maternal Grandfather    Social History   Socioeconomic History   . Marital status: Married    Spouse name: Not on file  . Number of children: 2  . Years of education: Not on file  . Highest education level: Not on file  Occupational History  . Occupation: respiratory therapists    Employer: [employer]  Social Needs  . Financial resource strain: Not on file  . Food insecurity:    Worry: Not on file    Inability: Not on file  . Transportation needs:    Medical: Not on file    Non-medical: Not on file  Tobacco Use  . Smoking status: Current Every Day Smoker    Types: Cigarettes  . Smokeless tobacco: Never Used  Substance and Sexual Activity  . Alcohol use: No  . Drug use: No  . Sexual activity: Not on file  Lifestyle  . Physical activity:    Days per week: Not on file    Minutes per session: Not on file  . Stress: Not on file  Relationships  . Social connections:    Talks on phone: Not on file    Gets together: Not on file    Attends religious service: Not on file    Active member of club or organization: Not on file    Attends meetings of clubs or organizations: Not on file    Relationship status: Not on file  . Intimate  partner violence:    Fear of current or ex partner: Not on file    Emotionally abused: Not on file    Physically abused: Not on file    Forced sexual activity: Not on file  Other Topics Concern  . 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 cyclobenzaprine (FLEXERIL) 5 MG tablet Take 1 tablet (5 mg total) by mouth at bedtime as needed for muscle spasms.  Marland Kitchen FLUoxetine HCl 60 MG TABS Take 60 mg by mouth daily.  . fluticasone furoate-vilanterol (BREO ELLIPTA) 100-25 MCG/INH AEPB Inhale 1 puff into the lungs daily. Dispensed as samle exp 8/19 lot [id number]  . gabapentin (NEURONTIN) 300 MG capsule Take 2 capsules (600 mg total) by mouth 3 (three) times  daily.  Marland Kitchen levonorgestrel (MIRENA) 20 MCG/24HR IUD 1 Intra Uterine Device (1 each total) by Intrauterine route once.  . montelukast (SINGULAIR) 10 MG tablet Take 1 tablet (10 mg total) by mouth at bedtime.  Marland Kitchen PROAIR HFA 108 (90 Base) MCG/ACT inhaler Inhale 1-2 puffs into the lungs every 6 (six) hours as needed for shortness of breath or wheezing.  . traZODone (DESYREL) 50 MG tablet Take 50 mg by mouth at bedtime as needed for sleep.   . [DISCONTINUED] LORazepam (ATIVAN) 0.5 MG tablet Take 1 tablet (0.5 mg total) by mouth daily as needed for anxiety.   Allergies  Allergen Reactions  . Penicillins Rash    Has patient had a PCN reaction causing immediate rash, facial/tongue/throat swelling, SOB or lightheadedness with hypotension: No Has patient had a PCN reaction causing severe rash involving mucus membranes or skin necrosis: No Has patient had a PCN reaction that required hospitalization No Has patient had a PCN reaction occurring within the last 10 years: No If all of the above answers are "NO", then may proceed with Cephalosporin use.   Recent Results (from the past 2160 hour(s))  Basic Metabolic Panel (BMET)     Status: None   Collection Time: 04/15/18  1:00 PM  Result Value Ref Range   Glucose, Bld 94 65 - 99 mg/dL    Comment: .            Fasting reference interval .    BUN 12 7 - 25 mg/dL   Creat 4.54 0.98 - 1.19 mg/dL   BUN/Creatinine Ratio NOT APPLICABLE 6 - 22 (calc)   Sodium 139 135 - 146 mmol/L   Potassium 4.5 3.5 - 5.3 mmol/L   Chloride 107 98 - 110 mmol/L   CO2 22 20 - 32 mmol/L   Calcium 8.6 8.6 - 10.2 mg/dL  Lipid panel     Status: Abnormal   Collection Time: 04/15/18  1:00 PM  Result Value Ref Range   Cholesterol 124 <200 mg/dL   HDL 44 (L) >14 mg/dL   Triglycerides 59 <782 mg/dL   LDL Cholesterol (Calc) 66 mg/dL (calc)    Comment: Reference range: <100 . Desirable range <100 mg/dL for primary prevention;   <70 mg/dL for patients with CHD or diabetic patients   with > or = 2 CHD risk factors. Marland Kitchen LDL-C is now calculated using the Martin-Hopkins  calculation, which is a validated novel method providing  better accuracy than the Friedewald equation in the  estimation of LDL-C.  Horald PollenMartin SS et al. Lenox AhrJAMA. 1610;960(452013;310(19): 2061-2068  (http://education.[facility].com/faq/FAQ164)    Total CHOL/HDL Ratio 2.8 <5.0 (calc)   Non-HDL Cholesterol (Calc) 80 <409<130 mg/dL (calc)    Comment: For patients with diabetes plus 1 major ASCVD risk  factor, treating to a non-HDL-C goal of <100 mg/dL  (LDL-C of <81<70 mg/dL) is considered a therapeutic  option.   CBC w/Diff     Status: None   Collection Time: 04/15/18  1:00 PM  Result Value Ref Range   WBC 5.6 3.8 - 10.8 Thousand/uL   RBC 4.18 3.80 - 5.10 Million/uL   Hemoglobin 12.7 11.7 - 15.5 g/dL   HCT 19.137.8 47.835.0 - 29.545.0 %   MCV 90.4 80.0 - 100.0 fL   MCH 30.4 27.0 - 33.0 pg   MCHC 33.6 32.0 - 36.0 g/dL   RDW 62.112.1 30.811.0 - 65.715.0 %   Platelets 296 140 - 400 Thousand/uL   MPV 11.2 7.5 - 12.5 fL   Neutro Abs 3,158 1,500 - 7,800 cells/uL   Lymphs Abs 1,540 850 - 3,900 cells/uL   WBC mixed population 426 200 - 950 cells/uL   Eosinophils Absolute 437 15 - 500 cells/uL   Basophils Absolute 39 0 - 200 cells/uL   Neutrophils Relative % 56.4 %   Total Lymphocyte 27.5 %   Monocytes Relative 7.6 %   Eosinophils Relative 7.8 %   Basophils Relative 0.7 %  TSH     Status: None   Collection Time: 04/15/18  1:00 PM  Result Value Ref Range   TSH 1.92 mIU/L    Comment:           Reference Range .           > or = 20 Years  0.40-4.50 .                Pregnancy Ranges           First trimester    0.26-2.66           Second trimester   0.55-2.73           Third trimester    0.43-2.91   T4, free     Status: None   Collection Time: 04/15/18  1:00 PM  Result Value Ref Range   Free T4 1.0 0.8 - 1.8 ng/dL  Hepatitis B surface antibody     Status: None   Collection Time: 04/15/18  1:00 PM  Result Value Ref Range    Hepatitis B-Post >1,000 > OR = 10 mIU/mL    Comment: . Patient has immunity to hepatitis B virus. . For additional information, please refer to http://education.[facility].com/faq/FAQ105 (This link is being provided for informational/ educational purposes only).    Objective  Body mass index is 22.66 kg/m. Wt Readings from Last 3 Encounters:  04/26/18 132 lb (59.9 kg)  02/20/18 128 lb 6.4 oz (58.2 kg)  01/05/18 164 lb (74.4 kg)   Temp Readings from Last 3 Encounters:  04/26/18 97.9 F (36.6 C) (Oral)  02/20/18 97.7 F (36.5 C) (Oral)  01/08/18 98.1 F (36.7 C) (Oral)   BP Readings from Last 3 Encounters:  04/26/18 126/84  02/20/18 94/60  01/08/18 128/71   Pulse Readings from Last 3 Encounters:  04/26/18 91  02/20/18 89  01/08/18 79    Physical Exam  Constitutional: She is oriented to person, place, and time. Vital signs are normal. She appears well-developed and well-nourished. She is cooperative.  HENT:  Head: Normocephalic and atraumatic.  Mouth/Throat: Oropharynx  is clear and moist and mucous membranes are normal.  Eyes: Pupils are equal, round, and reactive to light. Conjunctivae are normal.  Cardiovascular: Regular rhythm and normal heart sounds. Tachycardia present.  Pulmonary/Chest: Effort normal and breath sounds normal.  Neurological: She is alert and oriented to person, place, and time. Gait normal.  Skin: Skin is warm, dry and intact.  Psychiatric: She has a normal mood and affect. Her speech is normal and behavior is normal. Judgment and thought content normal. Cognition and memory are normal.  Nursing note and vitals reviewed.   Assessment   1. Anxiety/panic/depression 2. Asthma controlled  Plan  1. rec pt sch appt with psychiatry Dr. Helane Rima in [place] Cont gabapentin 600 tid, d/c ativan 0.5 qd change to xanax 0.5 bid prn  F/u with psych Pt denies doing etoh  Pt to call and sch appt if worse go to [facility] or [facility] 2. Rx breo when can afford it  helped  Provider: Dr. French Ana McLean-Scocuzza-Internal Medicine

## 2018-04-26 NOTE — Patient Instructions (Signed)
Please sch appt with Dr. Helane Rima in [place]  Take care  F/u in 4 months sooner if needed   Panic Attack A panic attack is a sudden episode of severe anxiety, fear, or discomfort that causes physical and emotional symptoms. The attack may be in response to something frightening, or it may occur for no known reason. Symptoms of a panic attack can be similar to symptoms of a heart attack or stroke. It is important to see your health care provider when you have a panic attack so that these conditions can be ruled out. A panic attack is a symptom of another condition. Most panic attacks go away with treatment of the underlying problem. If you have panic attacks often, you may have a condition called panic disorder. What are the causes? A panic attack may be caused by:  An extreme, life-threatening situation, such as a war or natural disaster.  An anxiety disorder, such as post-traumatic stress disorder.  Depression.  Certain medical conditions, including heart problems, neurological conditions, and infections.  Certain over-the-counter and prescription medicines.  Illegal drugs that increase heart rate and blood pressure, such as methamphetamine.  Alcohol.  Supplements that increase anxiety.  Panic disorder.  What increases the risk? You are more likely to develop this condition if:  You have an anxiety disorder.  You have another mental health condition.  You take certain medicines.  You use alcohol, illegal drugs, or other substances.  You are under extreme stress.  A life event is causing increased feelings of anxiety and depression.  What are the signs or symptoms? A panic attack starts suddenly, usually lasts about 20 minutes, and occurs with one or more of the following:  A pounding heart.  A feeling that your heart is beating irregularly or faster than normal (palpitations).  Sweating.  Trembling or shaking.  Shortness of breath or feeling  smothered.  Feeling choked.  Chest pain or discomfort.  Nausea or a strange feeling in your stomach.  Dizziness, feeling lightheaded, or feeling like you might faint.  Chills or hot flashes.  Numbness or tingling in your lips, hands, or feet.  Feeling confused, or feeling that you are not yourself.  Fear of losing control or being emotionally unstable.  Fear of dying.  How is this diagnosed? A panic attack is diagnosed with an assessment by your health care provider. During the assessment your health care provider will ask questions about:  Your history of anxiety, depression, and panic attacks.  Your medical history.  Whether you drink alcohol, use illegal drugs, take supplements, or take medicines. Be honest about your substance use.  Your health care provider may also:  Order blood tests or other kinds of tests to rule out serious medical conditions.  Refer you to a mental health professional for further evaluation.  How is this treated? Treatment depends on the cause of the panic attack:  If the cause is a medical problem, your health care provider will either treat that problem or refer you to a specialist.  If the cause is emotional, you may be given anti-anxiety medicines or referred to a counselor. These medicines may reduce how often attacks happen, reduce how severe the attacks are, and lower anxiety.  If the cause is a medicine, your health care provider may tell you to stop the medicine, change your dose, or take a different medicine.  If the cause is a drug, treatment may involve letting the drug wear off and taking medicine to help the  drug leave your body or to counteract its effects. Attacks caused by drug abuse may continue even if you stop using the drug.  Follow these instructions at home:  Take over-the-counter and prescription medicines only as told by your health care provider.  If you feel anxious, limit your caffeine intake.  Take good care  of your physical and mental health by: ? Eating a balanced diet that includes plenty of fresh fruits and vegetables, whole grains, lean meats, and low-fat dairy. ? Getting plenty of rest. Try to get 7-8 hours of uninterrupted sleep each night. ? Exercising regularly. Try to get 30 minutes of physical activity at least 5 days a week. ? Not smoking. Talk to your health care provider if you need help quitting. ? Limiting alcohol intake to no more than 1 drink a day for nonpregnant women and 2 drinks a day for men. One drink equals 12 oz of beer, 5 oz of wine, or 1 oz of hard liquor.  Keep all follow-up visits as told by your health care provider. This is important. Panic attacks may have underlying physical or emotional problems that take time to accurately diagnose. Contact a health care provider if:  Your symptoms do not improve, or they get worse.  You are not able to take your medicine as prescribed because of side effects. Get help right away if:  You have serious thoughts about hurting yourself or others.  You have symptoms of a panic attack. Do not drive yourself to the hospital. Have someone else drive you or call an ambulance. If you ever feel like you may hurt yourself or others, or you have thoughts about taking your own life, get help right away. You can go to your nearest emergency department or call:  Your local emergency services (911 in the [place]  A suicide crisis helpline, such as the National Suicide Prevention Lifeline at (215)157-8537. This is open 24 hours a day.  Summary  A panic attack is a sign of a serious health or mental health condition. Get help right away. Do not drive yourself to the hospital. Have someone else drive you or call an ambulance.  Always see a health care provider to have the reasons for the panic attack correctly diagnosed.  If your panic attack was caused by a physical problem, follow your health care provider's suggestions for medicine,  referral to a specialist, and lifestyle changes.  If your panic attack was caused by an emotional problem, follow through with counseling from a qualified mental health specialist.  If you feel like you may hurt yourself or others, call 911 and get help right away. This information is not intended to replace advice given to you by your health care provider. Make sure you discuss any questions you have with your health care provider. Document Released: 11/13/2005 Document Revised: 12/22/2016 Document Reviewed: 12/22/2016 Elsevier Interactive Patient Education  [employer].

## 2018-04-26 NOTE — Progress Notes (Signed)
Pre visit review using our clinic review tool, if applicable. No additional management support is needed unless otherwise documented below in the visit note. 

## 2018-04-30 ENCOUNTER — Other Ambulatory Visit: Payer: Self-pay | Admitting: Internal Medicine

## 2018-04-30 DIAGNOSIS — G8929 Other chronic pain: Secondary | ICD-10-CM

## 2018-06-24 ENCOUNTER — Other Ambulatory Visit: Payer: Self-pay | Admitting: Internal Medicine

## 2018-06-24 MED ORDER — CYCLOBENZAPRINE HCL 5 MG PO TABS: 5.0000 mg | ORAL_TABLET | Freq: Every evening | ORAL | 2 refills | Status: DC | PRN

## 2018-08-26 ENCOUNTER — Ambulatory Visit: Payer: Self-pay | Admitting: Internal Medicine

## 2018-11-29 ENCOUNTER — Telehealth: Payer: Self-pay | Admitting: Internal Medicine

## 2018-11-29 NOTE — Telephone Encounter (Signed)
[facility] clinic visit 11/28/2018  For Escreen urine done and normal POC Pre-employment labs   TMS

## 2019-02-22 ENCOUNTER — Other Ambulatory Visit: Payer: Self-pay | Admitting: Internal Medicine

## 2019-02-22 DIAGNOSIS — J4520 Mild intermittent asthma, uncomplicated: Secondary | ICD-10-CM

## 2019-02-22 MED ORDER — PROAIR HFA 108 (90 BASE) MCG/ACT IN AERS: 1.0000 | INHALATION_SPRAY | Freq: Four times a day (QID) | RESPIRATORY_TRACT | 2 refills | Status: DC | PRN

## 2019-08-25 ENCOUNTER — Telehealth: Payer: Self-pay | Admitting: Internal Medicine

## 2019-08-25 ENCOUNTER — Other Ambulatory Visit: Payer: Self-pay

## 2019-08-25 NOTE — Telephone Encounter (Signed)
Medication refill: PROAIR HFA 108 (90 Base) MCG/ACT inhaler [[id number]  Would like Ventolin HFA because it is cheaper.   Pharmacy:  [facility] [place], [address] [place] [address] [phone] (Phone) [phone] (Fax)   Pt aware of turn around time

## 2019-08-25 NOTE — Telephone Encounter (Signed)
mrication hss Albumin  Date Value Ref Range Status  01/06/2018 3.3 (L) 3.5 - 5.0 g/dL Final   ad been refilled/

## 2019-09-01 NOTE — Telephone Encounter (Signed)
Patient is calling to check the status of her inhaler refill.  Patient stated that if she needs a virtual appt. She could schedule that because she is all out.  Please advise and call to discuss.  CB# [phone]

## 2019-09-01 NOTE — Telephone Encounter (Signed)
Patient need ventolin inhaler the Pro Air is to expensive ok to substitute?

## 2019-09-01 NOTE — Telephone Encounter (Signed)
Pt calling to check on this.  States that her pharmacy still doesn't have any refills for her.  Pt is wanting the Ventolin because it is cheaper.

## 2019-09-02 ENCOUNTER — Other Ambulatory Visit: Payer: Self-pay | Admitting: Internal Medicine

## 2019-09-02 DIAGNOSIS — J4520 Mild intermittent asthma, uncomplicated: Secondary | ICD-10-CM

## 2019-09-02 MED ORDER — ALBUTEROL SULFATE HFA 108 (90 BASE) MCG/ACT IN AERS: 1.0000 | INHALATION_SPRAY | Freq: Four times a day (QID) | RESPIRATORY_TRACT | 0 refills | Status: DC | PRN

## 2019-09-02 NOTE — Telephone Encounter (Signed)
Patient returned call, notified her that refill on her inhaler was sent in.  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  She has virtual visit set up with Dr. Terese Door 09/04/2019, and will talk with her about setting up an in person office visit in the future.

## 2019-09-02 NOTE — Telephone Encounter (Signed)
Refilled 1 x inhaler but if moved to [facility] needs to establish PCP there  Did she move to [facility]?   [place]

## 2019-09-02 NOTE — Telephone Encounter (Signed)
Left message for patient OK for [facility] nurse to advise.

## 2019-09-04 ENCOUNTER — Ambulatory Visit: Payer: BLUE CROSS/BLUE SHIELD | Admitting: Internal Medicine

## 2019-09-04 DIAGNOSIS — Z0289 Encounter for other administrative examinations: Secondary | ICD-10-CM

## 2019-09-24 ENCOUNTER — Other Ambulatory Visit: Payer: Self-pay | Admitting: Internal Medicine

## 2019-09-24 DIAGNOSIS — J4520 Mild intermittent asthma, uncomplicated: Secondary | ICD-10-CM

## 2019-09-24 MED ORDER — ALBUTEROL SULFATE HFA 108 (90 BASE) MCG/ACT IN AERS: 1.0000 | INHALATION_SPRAY | Freq: Four times a day (QID) | RESPIRATORY_TRACT | 0 refills | Status: DC | PRN

## 2019-09-25 ENCOUNTER — Ambulatory Visit (INDEPENDENT_AMBULATORY_CARE_PROVIDER_SITE_OTHER): Payer: Self-pay | Admitting: Internal Medicine

## 2019-09-25 ENCOUNTER — Other Ambulatory Visit: Payer: Self-pay

## 2019-09-25 ENCOUNTER — Encounter: Payer: Self-pay | Admitting: Internal Medicine

## 2019-09-25 VITALS — Ht 64.0 in | Wt 132.0 lb

## 2019-09-25 DIAGNOSIS — J309 Allergic rhinitis, unspecified: Secondary | ICD-10-CM

## 2019-09-25 DIAGNOSIS — F411 Generalized anxiety disorder: Secondary | ICD-10-CM

## 2019-09-25 DIAGNOSIS — J4520 Mild intermittent asthma, uncomplicated: Secondary | ICD-10-CM

## 2019-09-25 DIAGNOSIS — Z13220 Encounter for screening for lipoid disorders: Secondary | ICD-10-CM

## 2019-09-25 DIAGNOSIS — F410 Panic disorder [episodic paroxysmal anxiety] without agoraphobia: Secondary | ICD-10-CM

## 2019-09-25 DIAGNOSIS — E559 Vitamin D deficiency, unspecified: Secondary | ICD-10-CM

## 2019-09-25 DIAGNOSIS — Z1389 Encounter for screening for other disorder: Secondary | ICD-10-CM

## 2019-09-25 DIAGNOSIS — F331 Major depressive disorder, recurrent, moderate: Secondary | ICD-10-CM

## 2019-09-25 DIAGNOSIS — Z0000 Encounter for general adult medical examination without abnormal findings: Secondary | ICD-10-CM

## 2019-09-25 DIAGNOSIS — Z1329 Encounter for screening for other suspected endocrine disorder: Secondary | ICD-10-CM

## 2019-09-25 DIAGNOSIS — F430 Acute stress reaction: Secondary | ICD-10-CM

## 2019-09-25 DIAGNOSIS — J4540 Moderate persistent asthma, uncomplicated: Secondary | ICD-10-CM

## 2019-09-25 MED ORDER — ALBUTEROL SULFATE HFA 108 (90 BASE) MCG/ACT IN AERS: 1.0000 | INHALATION_SPRAY | Freq: Four times a day (QID) | RESPIRATORY_TRACT | 2 refills | Status: DC | PRN

## 2019-09-25 MED ORDER — MONTELUKAST SODIUM 10 MG PO TABS: 10.0000 mg | ORAL_TABLET | Freq: Every day | ORAL | 0 refills | Status: DC

## 2019-09-25 MED ORDER — ALPRAZOLAM 0.5 MG PO TABS: 1.0000 mg | ORAL_TABLET | Freq: Two times a day (BID) | ORAL | Status: DC | PRN

## 2019-09-25 MED ORDER — LEVOCETIRIZINE DIHYDROCHLORIDE 5 MG PO TABS: 5.0000 mg | ORAL_TABLET | Freq: Every day | ORAL | 3 refills | Status: DC | PRN

## 2019-09-25 NOTE — Progress Notes (Addendum)
Telephone Note  I connected with Kelsey [name]  on 09/25/19 at  4:00 PM EDT by a telephone and verified that I am speaking with the correct person using two identifiers.  Location patient: home Location provider:work or home office Persons participating in the virtual visit: patient, provider  I discussed the limitations of evaluation and management by telemedicine and the availability of in person appointments. The patient expressed understanding and agreed to proceed.   HPI: 1. Asthma and allergies needs refills of antihistamine, ventolin inhaler and singulair   She is traveling [place] and for now in [facility] but will come back in 09/2019 x 3 months   2. Anxiety/depression seeing Dr. Harlene Ramus in [place] Q6 months and on prozac 60 mg qd, gabapentin 600 tid and xanax 1 mg bid prn but not having to use this as much doing better    ROS: See pertinent positives and negatives per HPI.  Past Medical History:  Diagnosis Date  . Anxiety   . Asthma    hosp. 2018 and 2019 no h/o intubation   . Chronic bronchitis ([place])    "get it pretty much q yr" (12/13/2015)  . Depression   . Depression   . GERD (gastroesophageal reflux disease)   . Insomnia   . Palpitations   . Pneumonia    "multiple times when I was a child; once as an adult so far" (12/13/2015)  . Pre-syncope   . PVC (premature ventricular contraction)   . Seasonal allergies   . Tachycardia     Past Surgical History:  Procedure Laterality Date  . CESAREAN SECTION  10/15/2009, 02/08/2006  . INCISION AND DRAINAGE PERIRECTAL ABSCESS Bilateral 11/22/2016   Procedure: IRRIGATION AND DEBRIDEMENT PERIRECTAL ABSCESS;  Surgeon: [name], MD;  Location: [facility];  Service: General;  Laterality: Bilateral;    Family History  Problem Relation Age of Onset  . Heart disease Father   . Rheumatologic disease Mother   . Arthritis Mother   . Asthma Mother   . Depression Mother   . Lung cancer Paternal Grandmother   . Asthma Maternal Grandmother   .  Hypertension Maternal Grandmother   . Heart disease Maternal Grandfather     SOCIAL HX:   Respiratory Therapist, 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[employer]  2 kids  Divorced but remarried since 05/2019 new last name will be Carlis Abbott Wears seat belt, feels safe in relationship    Current Outpatient Medications:  .  ALPRAZolam (XANAX) 0.5 MG tablet, Take 2 tablets (1 mg total) by mouth 2 (two) times daily as needed for anxiety., Disp: , Rfl:  .  albuterol (VENTOLIN HFA) 108 (90 Base) MCG/ACT inhaler, Inhale 1-2 puffs into the lungs every 6 (six) hours as needed for wheezing or shortness of breath. No further refills until appt, Disp: 18 g, Rfl: 2 .  cyclobenzaprine (FLEXERIL) 5 MG tablet, Take 1 tablet (5 mg total) by mouth at bedtime as needed for muscle spasms., Disp: 30 tablet, Rfl: 2 .  FLUoxetine HCl 60 MG TABS, Take 60 mg by mouth daily., Disp: 30 tablet, Rfl: 11 .  fluticasone furoate-vilanterol (BREO ELLIPTA) 100-25 MCG/INH AEPB, Inhale 1 puff into the lungs daily. Dispensed as samle exp 8/19 lot [id number], Disp: 1 each, Rfl: 0 .  gabapentin (NEURONTIN) 300 MG capsule, TAKE 2 CAPSULES (600 MG TOTAL) BY MOUTH 3 (THREE) TIMES DAILY., Disp: 540 capsule, Rfl: 1 .  levocetirizine (XYZAL) 5 MG tablet, Take 1 tablet (5 mg total) by mouth daily as needed for  allergies., Disp: 90 tablet, Rfl: 3 .  levonorgestrel (MIRENA) 20 MCG/24HR IUD, 1 Intra Uterine Device (1 each total) by Intrauterine route once., Disp: 1 each, Rfl: 0 .  montelukast (SINGULAIR) 10 MG tablet, Take 1 tablet (10 mg total) by mouth at bedtime., Disp: 90 tablet, Rfl: 0 .  traZODone (DESYREL) 50 MG tablet, Take 50 mg by mouth at bedtime as needed for sleep. , Disp: , Rfl:   EXAM:  VITALS per patient if applicable:  GENERAL: alert, oriented, appears well and in no acute distress  PSYCH/NEURO: pleasant and cooperative, no obvious depression or anxiety, speech and thought processing grossly intact  ASSESSMENT AND PLAN:  Discussed  the following assessment and plan:   Panic attack/anxiety/depression - Plan: ALPRAZolam (XANAX) 1 MG tablet bid prn prozac 60 mg qd and gabapentin 600 tid  F/u Dr. Helane Rima in [place] psych q 6 months  Mild intermittent asthma without complication with allergies - Plan: montelukast (SINGULAIR) 10 MG tablet, albuterol (VENTOLIN HFA) 108 (90 Base) MCG/ACT inhaler, levocetirizine (XYZAL) 5 MG tablet  HM UTD flu w/in next 2 weeks in 2020, pna 23 utd, had prevnar  Tdap per pt had in 2017   Pap [facility].GYN no h/o abnormal needed to get records requested today  -pap 10/13/13 neg HPV neg; pap had 10/14/19 [facility] end sch 09/2019 Skin -no issues as of 09/25/19   -we discussed possible serious and likely etiologies, options for evaluation and workup, limitations of telemedicine visit vs in person visit, treatment, treatment risks and precautions. Pt prefers to treat via telemedicine empirically rather then risking or undertaking an in person visit at this moment. Patient agrees to seek prompt in person care if worsening, new symptoms arise, or if is not improving with treatment.   I discussed the assessment and treatment plan with the patient. The patient was provided an opportunity to ask questions and all were answered. The patient agreed with the plan and demonstrated an understanding of the instructions.   The patient was advised to call back or seek an in-person evaluation if the symptoms worsen or if the condition fails to improve as anticipated.  Time spent 15 minutes Bevelyn Buckles, MD

## 2019-09-26 ENCOUNTER — Telehealth: Payer: Self-pay | Admitting: Internal Medicine

## 2019-09-26 NOTE — Telephone Encounter (Signed)
I called pt regarding scheduling appt/ Return for sch fasting labs when has insurance or when can w/in the next 3 months and f/u after labs .

## 2019-10-21 ENCOUNTER — Encounter: Payer: Self-pay | Admitting: Internal Medicine

## 2019-10-21 DIAGNOSIS — K644 Residual hemorrhoidal skin tags: Secondary | ICD-10-CM | POA: Insufficient documentation

## 2019-12-19 ENCOUNTER — Encounter (HOSPITAL_BASED_OUTPATIENT_CLINIC_OR_DEPARTMENT_OTHER): Payer: Self-pay | Admitting: *Deleted

## 2019-12-19 ENCOUNTER — Other Ambulatory Visit: Payer: Self-pay

## 2019-12-19 ENCOUNTER — Emergency Department (HOSPITAL_BASED_OUTPATIENT_CLINIC_OR_DEPARTMENT_OTHER)
Admission: EM | Admit: 2019-12-19 | Discharge: 2019-12-20 | Disposition: A | Discharge status: Home / Self Care | Payer: Commercial Managed Care - PPO | Attending: Emergency Medicine | Admitting: Emergency Medicine

## 2019-12-19 ENCOUNTER — Emergency Department (HOSPITAL_BASED_OUTPATIENT_CLINIC_OR_DEPARTMENT_OTHER): Payer: Commercial Managed Care - PPO

## 2019-12-19 DIAGNOSIS — F17210 Nicotine dependence, cigarettes, uncomplicated: Secondary | ICD-10-CM | POA: Insufficient documentation

## 2019-12-19 DIAGNOSIS — Z20822 Contact with and (suspected) exposure to covid-19: Secondary | ICD-10-CM | POA: Insufficient documentation

## 2019-12-19 DIAGNOSIS — J4541 Moderate persistent asthma with (acute) exacerbation: Secondary | ICD-10-CM | POA: Diagnosis not present

## 2019-12-19 DIAGNOSIS — Z79899 Other long term (current) drug therapy: Secondary | ICD-10-CM | POA: Insufficient documentation

## 2019-12-19 DIAGNOSIS — R0602 Shortness of breath: Secondary | ICD-10-CM | POA: Diagnosis present

## 2019-12-19 MED ORDER — ALBUTEROL SULFATE HFA 108 (90 BASE) MCG/ACT IN AERS
INHALATION_SPRAY | RESPIRATORY_TRACT | Status: AC
  Administered 2019-12-19: 6
  Filled 2019-12-19: qty 6.7

## 2019-12-19 MED ORDER — METHYLPREDNISOLONE SODIUM SUCC 125 MG IJ SOLR
125.0000 mg | Freq: Once | INTRAMUSCULAR | Status: AC
  Administered 2019-12-19: 125 mg via INTRAMUSCULAR
  Filled 2019-12-19: qty 2

## 2019-12-19 NOTE — ED Provider Notes (Signed)
[facility] EMERGENCY DEPARTMENT Provider Note   CSN: [id number] Arrival date & time: 12/19/19  2154     History Chief Complaint  Patient presents with   Covid Symptoms    Kelsey [name] is a 39 y.o. female.  Patient is a 39 year old female with past medical history of asthma, anxiety, GERD.  She presents today with complaints of shortness of breath, wheezing, and not feeling well.  This has worsened over the past 5 days.  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  She was tested 3 days ago and was told it was negative.  She has been using her inhaler at home with some relief.  She denies fevers, chills, chest pain, or productive cough.  She does report being around a cat recently which has caused her asthma to flareup in the past.  The history is provided by the patient.       Past Medical History:  Diagnosis Date   Anxiety    Asthma    hosp. 2018 and 2019 no h/o intubation    Chronic bronchitis ([facility])    "get it pretty much q yr" (12/13/2015)   Depression    Depression    GERD (gastroesophageal reflux disease)    Insomnia    Palpitations    Pneumonia    "multiple times when I was a child; once as an adult so far" (12/13/2015)   Pre-syncope    PVC (premature ventricular contraction)    Seasonal allergies    Tachycardia     Patient Active Problem List   Diagnosis Date Noted   Anal skin tag 10/21/2019   Anxiety and depression 02/20/2018   Disorder of vulva 02/20/2018   Asthma exacerbation 01/05/2018   Internal hemorrhoids 09/26/2017   History of alcoholism (HCC) 06/13/2017   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   Perianal abscess 11/22/2016   Asthma 10/15/2015   Allergic rhinitis 10/15/2015   Alcohol abuse 07/14/2015   GAD (generalized anxiety disorder) 07/14/2015   Depression, major, recurrent, moderate (HCC) 07/14/2015   Periscapular pain 01/19/2015   Left-sided thoracic back pain-greater than 20  years duration 01/12/2015   GERD (gastroesophageal reflux disease) 08/22/2013   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     Family History  Problem Relation Age of Onset   Heart disease Father    Rheumatologic disease Mother    Arthritis Mother    Asthma Mother    Depression Mother    Lung cancer Paternal Grandmother    Asthma Maternal Grandmother    Hypertension Maternal Grandmother    Heart disease Maternal Grandfather     Social History   Tobacco Use   Smoking status: Current Every Day Smoker    Types: Cigarettes   Smokeless tobacco: Never Used  Substance Use Topics   Alcohol use: No   Drug use: No    Home Medications Prior to Admission medications   Medication Sig Start Date End Date Taking? Authorizing Provider  albuterol (VENTOLIN HFA) 108 (90 Base) MCG/ACT inhaler Inhale 1-2 puffs into the lungs  every 6 (six) hours as needed for wheezing or shortness of breath. No further refills until appt 09/25/19  Yes McLean-Scocuzza, Pasty Spillers, MD  ALPRAZolam Prudy Feeler) 0.5 MG tablet Take 2 tablets (1 mg total) by mouth 2 (two) times daily as needed for anxiety. 09/25/19  Yes McLean-Scocuzza, Pasty Spillers, MD  FLUoxetine HCl 60 MG TABS Take 60 mg by mouth daily. 02/20/18  Yes McLean-Scocuzza, Pasty Spillers, MD  gabapentin (NEURONTIN) 300 MG capsule TAKE 2 CAPSULES (600 MG TOTAL) BY MOUTH 3 (THREE) TIMES DAILY. 05/03/18  Yes McLean-Scocuzza, Pasty Spillers, MD  levonorgestrel (MIRENA) 20 MCG/24HR IUD 1 Intra Uterine Device (1 each total) by Intrauterine route  once. 07/16/15  Yes Adonis Brook, NP  montelukast (SINGULAIR) 10 MG tablet Take 1 tablet (10 mg total) by mouth at bedtime. 09/25/19  Yes McLean-Scocuzza, Pasty Spillers, MD  traZODone (DESYREL) 50 MG tablet Take 50 mg by mouth at bedtime as needed for sleep.    Yes [provider]  cyclobenzaprine (FLEXERIL) 5 MG tablet Take 1 tablet (5 mg total) by mouth at bedtime as needed for muscle spasms. 06/24/18   McLean-Scocuzza, Pasty Spillers, MD  fluticasone furoate-vilanterol (BREO ELLIPTA) 100-25 MCG/INH AEPB Inhale 1 puff into the lungs daily. Dispensed as samle exp 8/19 lot [id number] 02/22/18   McLean-Scocuzza, Pasty Spillers, MD  levocetirizine (XYZAL) 5 MG tablet Take 1 tablet (5 mg total) by mouth daily as needed for allergies. 09/25/19   McLean-Scocuzza, Pasty Spillers, MD  atenolol (TENORMIN) 25 MG tablet Take 25 mg by mouth daily.  02/10/12  Duke Salvia, MD  diphenhydrAMINE (BENADRYL) 25 MG tablet Take by mouth daily as needed.    02/10/12  [provider]    Allergies    Penicillins  Review of Systems   Review of Systems  All other systems reviewed and are negative.   Physical Exam Updated Vital Signs BP (!) 139/101    Pulse (!) 108    Temp 97.8 F (36.6 C) (Oral)    Resp (!) 24    Ht 5\' 4"  (1.626 m)    Wt 77.1 kg    SpO2 100%    BMI 29.18 kg/m   Physical Exam Vitals and nursing note reviewed.  Constitutional:      General: She is not in acute distress.    Appearance: She is well-developed. She is not diaphoretic.  HENT:     Head: Normocephalic and atraumatic.  Cardiovascular:     Rate and Rhythm: Regular rhythm. Tachycardia present.     Heart sounds: No murmur. No friction rub. No gallop.   Pulmonary:     Effort: Pulmonary effort is normal. No respiratory distress.     Breath sounds: Normal breath sounds. No wheezing.  Abdominal:     General: Bowel sounds are normal. There is no distension.     Palpations: Abdomen is soft.     Tenderness: There is no abdominal tenderness.    Musculoskeletal:        General: No swelling or tenderness. Normal range of motion.     Cervical back: Normal range of motion and neck supple.     Right lower leg: No edema.     Left lower leg: No edema.  Skin:    General: Skin is warm and dry.  Neurological:     Mental Status: She is alert and oriented to person, place, and time.     ED Results / Procedures / Treatments   Labs (all labs ordered are listed, but only  abnormal results are displayed) Labs Reviewed  SARS CORONAVIRUS 2 (TAT 6-24 HRS)    EKG None  Radiology No results found.  Procedures Procedures (including critical care time)  Medications Ordered in ED Medications  methylPREDNISolone sodium succinate (SOLU-MEDROL) 125 mg/2 mL injection 125 mg (has no administration in time range)  albuterol (VENTOLIN HFA) 108 (90 Base) MCG/ACT inhaler (6 puffs  Given 12/19/19 2309)    ED Course  I have reviewed the triage vital signs and the nursing notes.  Pertinent labs & imaging results that were available during my care of the patient were reviewed by me and considered in my medical decision making (see chart for details).    MDM Rules/Calculators/A&P  Chest x-ray is clear.  I suspect that estimation of the patient's asthma related to exposure to a cat as this is happened to her in the past.  She was given IM Solu-Medrol along with albuterol and is now feeling significantly better.  Her oxygen saturations are adequate and lungs to my exam sound clear.  She is somewhat tachycardic, however I suspect this is related to the albuterol she was given.  At this point, I feel as though discharged with a course of prednisone and continued use of her albuterol is appropriate.  An additional Covid test has been obtained and is pending.  Final Clinical Impression(s) / ED Diagnoses Final diagnoses:  None    Rx / DC Orders ED Discharge Orders    None       Veryl Speak, MD 12/20/19 [phone]

## 2019-12-19 NOTE — ED Notes (Signed)
Patient on cardiac monitor with vitals set to Q 30 minutes.

## 2019-12-19 NOTE — ED Triage Notes (Addendum)
Abdominal pain, diarrhea, SOB, runny nose, fatigue, chills, body aches, headache, weakness, cough and sore throat for a week. She had a negative Covid test 3 days ago. 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

## 2019-12-20 LAB — SARS CORONAVIRUS 2 (TAT 6-24 HRS): SARS Coronavirus 2: NEGATIVE

## 2019-12-20 MED ORDER — PREDNISONE 10 MG PO TABS: 20.0000 mg | ORAL_TABLET | Freq: Two times a day (BID) | ORAL | 0 refills | Status: DC

## 2019-12-20 NOTE — Discharge Instructions (Signed)
Begin taking prednisone as prescribed.  Continue use of your albuterol inhaler, 2 puffs every 4 hours as needed.  Isolate at home until the results of your Covid test are known.  This should be within the next 24 hours.  Return to the emergency department in the meantime if your symptoms significantly worsen or change.

## 2020-01-05 ENCOUNTER — Encounter: Payer: Self-pay | Admitting: Internal Medicine

## 2020-01-18 ENCOUNTER — Encounter: Payer: Self-pay | Admitting: Internal Medicine

## 2020-01-23 ENCOUNTER — Telehealth: Payer: Self-pay | Admitting: Internal Medicine

## 2020-01-23 DIAGNOSIS — J4520 Mild intermittent asthma, uncomplicated: Secondary | ICD-10-CM

## 2020-01-23 MED ORDER — ALBUTEROL SULFATE HFA 108 (90 BASE) MCG/ACT IN AERS: 1.0000 | INHALATION_SPRAY | Freq: Four times a day (QID) | RESPIRATORY_TRACT | 2 refills | Status: DC | PRN

## 2020-01-23 NOTE — Telephone Encounter (Signed)
Patient needs a refill on her albuterol (VENTOLIN HFA) 108 (90 Base) MCG/ACT inhaler.  Pt is almost out of inhaler.FYI patient is scheduled for labs on 01/30/2020.

## 2020-01-30 ENCOUNTER — Other Ambulatory Visit: Payer: Commercial Managed Care - PPO

## 2020-01-30 ENCOUNTER — Other Ambulatory Visit (INDEPENDENT_AMBULATORY_CARE_PROVIDER_SITE_OTHER): Payer: Commercial Managed Care - PPO

## 2020-01-30 ENCOUNTER — Other Ambulatory Visit: Payer: Self-pay

## 2020-01-30 DIAGNOSIS — Z0000 Encounter for general adult medical examination without abnormal findings: Secondary | ICD-10-CM | POA: Diagnosis not present

## 2020-01-30 DIAGNOSIS — E559 Vitamin D deficiency, unspecified: Secondary | ICD-10-CM

## 2020-01-30 DIAGNOSIS — Z13220 Encounter for screening for lipoid disorders: Secondary | ICD-10-CM

## 2020-01-30 DIAGNOSIS — Z1389 Encounter for screening for other disorder: Secondary | ICD-10-CM

## 2020-01-30 DIAGNOSIS — Z1329 Encounter for screening for other suspected endocrine disorder: Secondary | ICD-10-CM

## 2020-01-30 LAB — COMPREHENSIVE METABOLIC PANEL
ALT: 13 U/L (ref 0–35)
AST: 16 U/L (ref 0–37)
Albumin: 4.1 g/dL (ref 3.5–5.2)
Alkaline Phosphatase: 92 U/L (ref 39–117)
BUN: 14 mg/dL (ref 6–23)
CO2: 24 mEq/L (ref 19–32)
Calcium: 9.2 mg/dL (ref 8.4–10.5)
Chloride: 103 mEq/L (ref 96–112)
Creatinine, Ser: 0.67 mg/dL (ref 0.40–1.20)
GFR: 97.9 mL/min (ref >60.00)
Glucose, Bld: 105 mg/dL — ABNORMAL HIGH (ref 70–99)
Potassium: 4.5 mEq/L (ref 3.5–5.1)
Sodium: 136 mEq/L (ref 135–145)
Total Bilirubin: 0.9 mg/dL (ref 0.2–1.2)
Total Protein: 7.1 g/dL (ref 6.0–8.3)

## 2020-01-30 LAB — CBC WITH DIFFERENTIAL/PLATELET
Basophils Absolute: 0 10*3/uL (ref 0.0–0.1)
Basophils Relative: 0.6 % (ref 0.0–3.0)
Eosinophils Absolute: 0 10*3/uL (ref 0.0–0.7)
Eosinophils Relative: 0.6 % (ref 0.0–5.0)
HCT: 42.1 % (ref 36.0–46.0)
Hemoglobin: 14.2 g/dL (ref 12.0–15.0)
Lymphocytes Relative: 17.8 % (ref 12.0–46.0)
Lymphs Abs: 1.1 10*3/uL (ref 0.7–4.0)
MCHC: 33.7 g/dL (ref 30.0–36.0)
MCV: 97.5 fl (ref 78.0–100.0)
Monocytes Absolute: 0.6 10*3/uL (ref 0.1–1.0)
Monocytes Relative: 9.5 % (ref 3.0–12.0)
Neutro Abs: 4.5 10*3/uL (ref 1.4–7.7)
Neutrophils Relative %: 71.5 % (ref 43.0–77.0)
Platelets: 300 10*3/uL (ref 150.0–400.0)
RBC: 4.32 Mil/uL (ref 3.87–5.11)
RDW: 13.5 % (ref 11.5–15.5)
WBC: 6.3 10*3/uL (ref 4.0–10.5)

## 2020-01-30 LAB — LIPID PANEL
Cholesterol: 176 mg/dL (ref 0–200)
HDL: 58.4 mg/dL (ref >39.00)
LDL Cholesterol: 87 mg/dL (ref 0–99)
NonHDL: 117.91
Total CHOL/HDL Ratio: 3
Triglycerides: 157 mg/dL — ABNORMAL HIGH (ref 0.0–149.0)
VLDL: 31.4 mg/dL (ref 0.0–40.0)

## 2020-01-30 LAB — TSH: TSH: 3.03 u[IU]/mL (ref 0.35–4.50)

## 2020-01-30 LAB — VITAMIN D 25 HYDROXY (VIT D DEFICIENCY, FRACTURES): VITD: 25.58 ng/mL — ABNORMAL LOW (ref 30.00–100.00)

## 2020-01-31 LAB — URINALYSIS, ROUTINE W REFLEX MICROSCOPIC
Bilirubin Urine: NEGATIVE
Glucose, UA: NEGATIVE
Hgb urine dipstick: NEGATIVE
Ketones, ur: NEGATIVE
Leukocytes,Ua: NEGATIVE
Nitrite: NEGATIVE
Protein, ur: NEGATIVE
Specific Gravity, Urine: 1.023 (ref 1.001–1.03)
pH: 7 (ref 5.0–8.0)

## 2020-02-04 ENCOUNTER — Telehealth: Payer: Commercial Managed Care - PPO | Admitting: Internal Medicine

## 2020-03-23 ENCOUNTER — Other Ambulatory Visit: Payer: Self-pay | Admitting: Internal Medicine

## 2020-03-23 ENCOUNTER — Telehealth: Payer: Self-pay | Admitting: Internal Medicine

## 2020-03-23 DIAGNOSIS — J4520 Mild intermittent asthma, uncomplicated: Secondary | ICD-10-CM

## 2020-03-23 MED ORDER — ALBUTEROL SULFATE HFA 108 (90 BASE) MCG/ACT IN AERS: 1.0000 | INHALATION_SPRAY | Freq: Four times a day (QID) | RESPIRATORY_TRACT | 5 refills | Status: DC | PRN

## 2020-03-23 NOTE — Telephone Encounter (Signed)
She was in ED 11/2019 for asthma flare  Does she want referral to allergy or pulmonary?  Which city if so?   TMS

## 2020-03-24 ENCOUNTER — Telehealth: Payer: Self-pay | Admitting: Internal Medicine

## 2020-03-24 NOTE — Telephone Encounter (Signed)
Pt needs refill on albuterol (VENTOLIN HFA) 108 (90 Base) MCG/ACT inhaler but would like something more affordable. Insurance terms at the end of this month. [facility] in [place].

## 2020-03-25 NOTE — Telephone Encounter (Signed)
Left message to return call 

## 2020-03-25 NOTE — Telephone Encounter (Signed)
See 03/24/20 phone encounter

## 2020-03-25 NOTE — Telephone Encounter (Signed)
Patient informed and verbalized understanding.  States she is agreeable to whichever referral you think would be best.

## 2020-03-25 NOTE — Telephone Encounter (Signed)
She was in ED 11/2019 for asthma flare  Does she want referral to allergy or pulmonary?  Which city if so?     I have refilled ventolin  She can look on [facility] for coupon  She needs to call her insurance and let us know which they prefer for her plan and get back to [place] I rec above allergy or pulm Is she agreeable to referral?

## 2020-03-25 NOTE — Telephone Encounter (Signed)
Pt called to check on refill request.

## 2020-03-29 NOTE — Addendum Note (Signed)
Addended by: Quentin Ore on: 03/29/2020 12:57 PM   Modules accepted: Orders

## 2020-04-23 ENCOUNTER — Telehealth: Payer: Self-pay | Admitting: Internal Medicine

## 2020-04-23 NOTE — Telephone Encounter (Signed)
lvm for pt to schedule follow up

## 2020-06-02 ENCOUNTER — Telehealth: Payer: Self-pay | Admitting: Internal Medicine

## 2020-06-02 NOTE — Telephone Encounter (Signed)
Left vm for pt to call ofc regarding referral to pulmonology.

## 2020-06-03 ENCOUNTER — Telehealth: Payer: Self-pay | Admitting: Internal Medicine

## 2020-06-03 NOTE — Telephone Encounter (Signed)
Patient will not have health insurance until the first of 2022. She wanted to know if Dr. Shirlee Latch could refill in inhaler, generic.

## 2020-06-07 ENCOUNTER — Other Ambulatory Visit: Payer: Self-pay | Admitting: Internal Medicine

## 2020-06-07 DIAGNOSIS — J4520 Mild intermittent asthma, uncomplicated: Secondary | ICD-10-CM

## 2020-06-07 MED ORDER — ALBUTEROL SULFATE HFA 108 (90 BASE) MCG/ACT IN AERS: 1.0000 | INHALATION_SPRAY | Freq: Four times a day (QID) | RESPIRATORY_TRACT | 5 refills | Status: DC | PRN

## 2020-06-07 NOTE — Telephone Encounter (Signed)
Sent albuterol inhaler generic or she can use goodrx  to [facility] --> if needs a different pharmacy please send in to new pharmacy [facility]   Also for asthma she needs to see pulmonary in the future when she has insurance due to frequent Ed/urgent care visits  and its not controlled   TMS

## 2020-06-07 NOTE — Telephone Encounter (Signed)
Patient informed and verbalized understanding.  States she has a pulmonologist but has been unable to see them. States if once she can see them she needs a referral  again she will call in.

## 2020-10-02 ENCOUNTER — Encounter (HOSPITAL_COMMUNITY): Payer: Self-pay

## 2020-10-02 ENCOUNTER — Emergency Department (HOSPITAL_COMMUNITY)
Admission: EM | Admit: 2020-10-02 | Discharge: 2020-10-02 | Disposition: A | Discharge status: Home / Self Care | Payer: Self-pay | Attending: Emergency Medicine | Admitting: Emergency Medicine

## 2020-10-02 ENCOUNTER — Other Ambulatory Visit: Payer: Self-pay

## 2020-10-02 DIAGNOSIS — F19230 Other psychoactive substance dependence with withdrawal, uncomplicated: Secondary | ICD-10-CM

## 2020-10-02 DIAGNOSIS — F17210 Nicotine dependence, cigarettes, uncomplicated: Secondary | ICD-10-CM | POA: Insufficient documentation

## 2020-10-02 DIAGNOSIS — F10239 Alcohol dependence with withdrawal, unspecified: Secondary | ICD-10-CM | POA: Insufficient documentation

## 2020-10-02 DIAGNOSIS — G40509 Epileptic seizures related to external causes, not intractable, without status epilepticus: Secondary | ICD-10-CM | POA: Insufficient documentation

## 2020-10-02 DIAGNOSIS — R569 Unspecified convulsions: Secondary | ICD-10-CM

## 2020-10-02 DIAGNOSIS — F13939 Sedative, hypnotic or anxiolytic use, unspecified with withdrawal, unspecified: Secondary | ICD-10-CM

## 2020-10-02 DIAGNOSIS — Z79899 Other long term (current) drug therapy: Secondary | ICD-10-CM | POA: Insufficient documentation

## 2020-10-02 DIAGNOSIS — J45901 Unspecified asthma with (acute) exacerbation: Secondary | ICD-10-CM | POA: Insufficient documentation

## 2020-10-02 DIAGNOSIS — Z7951 Long term (current) use of inhaled steroids: Secondary | ICD-10-CM | POA: Insufficient documentation

## 2020-10-02 DIAGNOSIS — F19930 Other psychoactive substance use, unspecified with withdrawal, uncomplicated: Secondary | ICD-10-CM

## 2020-10-02 DIAGNOSIS — F13239 Sedative, hypnotic or anxiolytic dependence with withdrawal, unspecified: Secondary | ICD-10-CM

## 2020-10-02 MED ORDER — ALPRAZOLAM 1 MG PO TABS: 1.0000 mg | ORAL_TABLET | Freq: Every evening | ORAL | 0 refills | Status: DC | PRN

## 2020-10-02 MED ORDER — LORAZEPAM 2 MG/ML IJ SOLN
1.0000 mg | Freq: Once | INTRAMUSCULAR | Status: AC
  Administered 2020-10-02: 1 mg via INTRAMUSCULAR
  Filled 2020-10-02: qty 1

## 2020-10-02 NOTE — ED Triage Notes (Signed)
Pt brought here by spouse for possible seizures. Pt says she doesn't remember the event, but her spouse says about 1-1/2 hours ago she "locked up and starting shaking". Duration reported to be 3 minutes. Pt also says that she takes xanax 3 times a day and has not had any in 4 days because she is out.

## 2020-10-02 NOTE — Consult Note (Signed)
[facility] Tele[facility]urology Consult Services  Stat Consult  Date of Service:   10/02/2020 05:47:20  Impression:     .  R56.9 - Seizures  Comments/Sign-Out: 39 yo woman presenting for likely xanax withdrawal seizures. The leftward head and eye deviation gives me pause, and I had recommended admission for further workup including brain MRI and EEG however the patient did not want to stay given lack of insurance. I did discuss driving restrictions with her. She will need to see her PCP and psychiatrist regarding xanax use. Low threshold for outpatient neurology consultation.  CT HEAD: Not Reviewed not done  Our recommendations are outlined below.  Diagnostic Studies: MRI brain w/wo contrast Routine EEG  Seizure precautions: Seizure precautions including no driving for state mandated time frame were discussed with patient with clear understanding  Disposition: Neurology will sign off. Reconsult if Needed.  Free Text:    Additional Recommendations::     Metrics: [facility] Notification Time: 10/02/2020 05:45:15 Stamp Time: 10/02/2020 05:47:20 Callback Response Time: 10/02/2020 05:50:29   ----------------------------------------------------------------------------------------------------  Chief Complaint: seizures  History of Present Illness: Patient is a 39 year old Female.  This is a 39 yo woman with history of anxiety who presents for evaluation of three seizures last night. She became nonresponsive, her head deviated to the left, as did her eyes. She did not have any incontinence or tongue biting. Each episode lasted about three minutes and she returned to baseline. She feels back to normal now. She takes up to 3-4 xanax pills a day and ran out recently. Denies history of seizures.           Examination: BP(160/120), Pulse(87), Blood Glucose(pending)  Neuro Exam:  General: Alert,Awake, Oriented to Time, Place, Person  Speech:  Fluent:  Language: Intact:  Face: Symmetric:  Facial Sensation: Intact:  Visual Fields: Intact:  Extraocular Movements: Intact:  Motor Exam: No Drift:  Sensation: Intact:  Coordination: Intact:      Patient/Family was informed the Neurology Consult would occur via TeleHealth consult by way of interactive audio and video telecommunications and consented to receiving care in this manner.  Patient is being evaluated for possible acute neurologic impairment and high probability of imminent or life-threatening deterioration. I spent total of 20 minutes providing care to this patient, including time for face to face visit via telemedicine, review of medical records, imaging studies and discussion of findings with providers, the patient and/or family.   Dr Parthenia Ames   [facility] [phone]  Case [id number]

## 2020-10-02 NOTE — Discharge Instructions (Addendum)
You need to discuss your Xanax medication with Dr.Kaur.  I gave you enough to get through the weekend.  No driving until you are cleared to drive either by your psychiatrist or Dr. Gerilyn Pilgrim, a neurologist.  If you have another seizure you should return to the emergency department and be prepared to stay and be admitted at that point.

## 2020-10-02 NOTE — ED Notes (Signed)
Patient medically stable at this time.

## 2020-10-02 NOTE — ED Provider Notes (Signed)
[facility] EMERGENCY DEPARTMENT Provider Note   CSN: [id number] Arrival date & time: 10/02/20  0018   Time seen 3:33 AM  History Chief Complaint  Patient presents with  . possible seizure    Kelsey [name] is a 39 y.o. female.  HPI   Patient states she has been on Xanax for depression and anxiety for about 7 to 8 years.  She is followed by Dr. Evelene CroonKaur, psychiatrist.  She states over the past month she has been under some increased stress and she has been taking her pills 3 times a day.  She states she ran out early and she cannot get them refilled until November 8.  She states she took 2 pills on November 1, none on November 2 or 3.  She took a half a pill on Thursday.  She states she has been feeling jerky and shaky.  Yesterday, November 5 she had 3 episodes of what her husband describes that sounds like seizures.  She states the first episode she was in her bedroom alone and when she woke up she was on her bed.  Nobody else was in the room with her.  The second episode was in the evening she was sitting on the porch with her husband and a friend and she got stiff and had jerking all over for about 3 to 5 minutes.  She did not suffer a fall.  The third time was this evening at 9:30 PM she was at her parents house in a recliner when she had a third episode.  She states she has never had seizures before.  She states she feels fine now.  She denies having a sore tongue.  She has some mild left-sided headache but denies any trauma today.  She had nausea and vomiting and diarrhea on November 2, third, and fourth but not on the fifth.  PCP McLean-Scocuzza, Pasty Spillersracy N, MD Psychiatry Dr Evelene CroonKaur  Past Medical History:  Diagnosis Date  . Anxiety   . Asthma    hosp. 2018 and 2019 no h/o intubation   . Chronic bronchitis (HCC)    "get it pretty much q yr" (12/13/2015)  . Depression   . Depression   . GERD (gastroesophageal reflux disease)   . Insomnia   . Palpitations   . Pneumonia    "multiple  times when I was a child; once as an adult so far" (12/13/2015)  . Pre-syncope   . PVC (premature ventricular contraction)   . Seasonal allergies   . Tachycardia     Patient Active Problem List   Diagnosis Date Noted  . Anal skin tag 10/21/2019  . Anxiety and depression 02/20/2018  . Disorder of vulva 02/20/2018  . Asthma exacerbation 01/05/2018  . Internal hemorrhoids 09/26/2017  . History of alcoholism (HCC) 06/13/2017  . 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 Perianal abscess 11/22/2016  . Asthma 10/15/2015  . Allergic rhinitis 10/15/2015  . Alcohol abuse 07/14/2015  . GAD (generalized anxiety disorder) 07/14/2015  . Depression, major, recurrent, moderate (HCC) 07/14/2015  . Periscapular pain 01/19/2015  . Left-sided thoracic back pain-greater than 20 years duration 01/12/2015  . GERD (gastroesophageal reflux disease) 08/22/2013  . 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 Abnormal ECG- T  ST changes in V3 through V5 05/02/2011    Past Surgical History:  Procedure Laterality Date  . CESAREAN SECTION  10/15/2009, 02/08/2006  . INCISION AND DRAINAGE PERIRECTAL ABSCESS Bilateral 11/22/2016   Procedure: IRRIGATION AND DEBRIDEMENT PERIRECTAL ABSCESS;  Surgeon: Ovidio Kin, MD;  Location: [facility] ORS;  Service: General;  Laterality: Bilateral;  . INTRAUTERINE DEVICE INSERTION     Mirena 12/24/19 exp 12/28/21 Dr Luther Parody [facility]     OB History   No obstetric history on file.     Family History  Problem Relation Age of Onset  . Heart disease Father   . Rheumatologic disease Mother   . Arthritis Mother   . Asthma Mother   . Depression Mother   . Lung cancer Paternal Grandmother   . Asthma Maternal Grandmother   . Hypertension Maternal Grandmother   . Heart disease Maternal Grandfather     Social History   Tobacco Use  . Smoking status: Current Every Day Smoker    Types: Cigarettes  . Smokeless tobacco: Never Used  Substance Use Topics  .  Alcohol use: No  . Drug use: No    Home Medications Prior to Admission medications   Medication Sig Start Date End Date Taking? Authorizing Provider  albuterol (VENTOLIN HFA) 108 (90 Base) MCG/ACT inhaler Inhale 1-2 puffs into the lungs every 6 (six) hours as needed for wheezing or shortness of breath. No further refills until appt 06/07/20   McLean-Scocuzza, Pasty Spillers, MD  ALPRAZolam Prudy Feeler) 1 MG tablet Take 1 tablet (1 mg total) by mouth at bedtime as needed for anxiety. 10/02/20   Devoria Albe, MD  cyclobenzaprine (FLEXERIL) 5 MG tablet Take 1 tablet (5 mg total) by mouth at bedtime as needed for muscle spasms. 06/24/18   McLean-Scocuzza, Pasty Spillers, MD  FLUoxetine HCl 60 MG TABS Take 60 mg by mouth daily. 02/20/18   McLean-Scocuzza, Pasty Spillers, MD  fluticasone furoate-vilanterol (BREO ELLIPTA) 100-25 MCG/INH AEPB Inhale 1 puff into the lungs daily. Dispensed as samle exp 8/19 lot [id number] 02/22/18   McLean-Scocuzza, Pasty Spillers, MD  gabapentin (NEURONTIN) 300 MG capsule TAKE 2 CAPSULES (600 MG TOTAL) BY MOUTH 3 (THREE) TIMES DAILY. 05/03/18   McLean-Scocuzza, Pasty Spillers, MD  levocetirizine (XYZAL) 5 MG tablet Take 1 tablet (5 mg total) by mouth daily as needed for allergies. 09/25/19   McLean-Scocuzza, Pasty Spillers, MD  levonorgestrel (MIRENA) 20 MCG/24HR IUD 1 Intra Uterine Device (1 each total) by Intrauterine route once. 07/16/15   Adonis Brook, NP  montelukast (SINGULAIR) 10 MG tablet Take 1 tablet (10 mg total) by mouth at bedtime. 09/25/19   McLean-Scocuzza, Pasty Spillers, MD  predniSONE (DELTASONE) 10 MG tablet Take 2 tablets (20 mg total) by mouth 2 (two) times daily. 12/20/19   Geoffery Lyons, MD  traZODone (DESYREL) 50 MG tablet Take 50 mg by mouth at bedtime as needed for sleep.     [provider]  atenolol (TENORMIN) 25 MG tablet Take 25 mg by mouth daily.  02/10/12  Duke Salvia, MD  diphenhydrAMINE (BENADRYL) 25 MG tablet Take by mouth daily as needed.    02/10/12  [provider]     Allergies    Penicillins  Review of Systems   Review of Systems  All other systems reviewed and are negative.   Physical Exam Updated Vital Signs BP (!) 160/120   Pulse 87   Temp 98.1 F (36.7 C) (Oral)   Resp (!) 23   Ht 5\' 4"  (1.626 m)   Wt 70.3 kg   SpO2 95%   BMI 26.61 kg/m   Physical Exam Vitals and nursing note reviewed.  Constitutional:      General: She is not in acute distress.    Appearance:  Normal appearance. She is obese.  HENT:     Head: Normocephalic and atraumatic.     Right Ear: External ear normal.     Left Ear: External ear normal.     Mouth/Throat:     Mouth: Mucous membranes are moist.     Pharynx: Oropharynx is clear.     Comments: No trauma noted to her tongue Eyes:     Extraocular Movements: Extraocular movements intact.     Conjunctiva/sclera: Conjunctivae normal.     Pupils: Pupils are equal, round, and reactive to light.  Cardiovascular:     Rate and Rhythm: Normal rate and regular rhythm.     Pulses: Normal pulses.     Heart sounds: Normal heart sounds.  Pulmonary:     Effort: Pulmonary effort is normal. No respiratory distress.     Breath sounds: Normal breath sounds.  Musculoskeletal:     Cervical back: Normal range of motion.  Neurological:     General: No focal deficit present.     Mental Status: She is alert and oriented to person, place, and time.     Cranial Nerves: No cranial nerve deficit.     Deep Tendon Reflexes: Abnormal reflex:      Comments: Patient has mild faint tremor.  Psychiatric:        Mood and Affect: Mood normal.        Behavior: Behavior normal.        Thought Content: Thought content normal.     ED Results / Procedures / Treatments   Labs (all labs ordered are listed, but only abnormal results are displayed) Labs Reviewed - No data to display  EKG None  Radiology No results found.  Procedures Procedures (including critical care time)      Medications Ordered in ED Medications   LORazepam (ATIVAN) injection 1 mg (1 mg Intramuscular Given 10/02/20 0438)    ED Course  I have reviewed the triage vital signs and the nursing notes.  Pertinent labs & imaging results that were available during my care of the patient were reviewed by me and considered in my medical decision making (see chart for details).    MDM Rules/Calculators/A&P                          Patient has been on benzodiazepines for several years.  She increased her dosage per day and has run out of her alprazolam about a week early.  She cannot get her medication refilled until the eighth.  She was given Ativan 1 mg IM.   Recheck at 5:20 AM husband is here in the room now.  He states patient got stiff and she looked to the side and had jerking all over.  He states she was clenching her teeth and her lips and around her eyes got bluish.  We discussed that she was having benzodiazepine withdrawal seizures.  I also informed her she should not drive for at least 6 months or until released to do so by a neurologist.  I gave her enough pills just to get through the weekend.  She appears to be getting escalating doses of the Xanax for her life stresses.  She probably needs a different outlet to relieve that stress.  Because she had 3 seizures in 24 hours I decided to have her evaluated by the teleneurologist.  5:56 AM Maralyn Sago, teleneurology nurse given information and reason for consult.  6:21 AM Dr.Patryoo, neurologist has evaluated patient.  His concern is that the husband said she looked to the left and her eyes deviated to the left.  He thought with 3 seizures she should be admitted however he has discussed this with the patient and she does not want to be admitted because she does not have any insurance.  He agrees with restricting her driving until she sees a neurologist.  Review of the [place] database shows patient was getting #60 alprazolam 1 mg tablets monthly however in September it was increased to  75 tablets and she even got a extra prescription on November 1 for 18 tablets.   Final Clinical Impression(s) / ED Diagnoses Final diagnoses:  Benzodiazepine withdrawal with complication (HCC)  Withdrawal seizures, uncomplicated (HCC)    Rx / DC Orders ED Discharge Orders         Ordered    ALPRAZolam Prudy Feeler) 1 MG tablet  At bedtime PRN        10/02/20 0641          Plan discharge  Devoria Albe, MD, Concha Pyo, MD 10/02/20 [phone]

## 2020-10-02 NOTE — ED Notes (Signed)
Teleneurology cart to bedside for consult.

## 2020-10-02 NOTE — ED Notes (Signed)
Patient discharged to caregiver.  Ambulatory out of ED.  All discharge instructions reviewed.  Patient demonstrated knowledge via teachback method.

## 2020-10-02 NOTE — ED Notes (Signed)
Received care of patient.  She states she thinks she had 3 possible seizures today. States she "locks up and starts shaking and cannot remember what happened." States she recently ran out of her xanax and feels she may be in withdraw.  Last dose 09/30/2020 0.5 mg.

## 2021-03-29 ENCOUNTER — Emergency Department (HOSPITAL_COMMUNITY): Admission: EM | Admit: 2021-03-29 | Discharge: 2021-03-29 | Discharge status: Against Medical Advice | Payer: Self-pay

## 2021-03-29 ENCOUNTER — Encounter: Payer: Self-pay | Admitting: Internal Medicine

## 2021-03-29 ENCOUNTER — Other Ambulatory Visit: Payer: Self-pay

## 2021-03-29 NOTE — ED Triage Notes (Signed)
Not in waiting area when called for triage 

## 2021-06-30 ENCOUNTER — Emergency Department (HOSPITAL_COMMUNITY): Payer: Commercial Managed Care - PPO

## 2021-06-30 ENCOUNTER — Emergency Department (HOSPITAL_COMMUNITY)
Admission: EM | Admit: 2021-06-30 | Discharge: 2021-06-30 | Disposition: A | Discharge status: Home / Self Care | Payer: Commercial Managed Care - PPO | Attending: Emergency Medicine | Admitting: Emergency Medicine

## 2021-06-30 ENCOUNTER — Encounter (HOSPITAL_COMMUNITY): Payer: Self-pay | Admitting: Emergency Medicine

## 2021-06-30 ENCOUNTER — Other Ambulatory Visit: Payer: Self-pay

## 2021-06-30 DIAGNOSIS — M5459 Other low back pain: Secondary | ICD-10-CM | POA: Diagnosis not present

## 2021-06-30 DIAGNOSIS — F17210 Nicotine dependence, cigarettes, uncomplicated: Secondary | ICD-10-CM | POA: Insufficient documentation

## 2021-06-30 DIAGNOSIS — M5431 Sciatica, right side: Secondary | ICD-10-CM | POA: Diagnosis not present

## 2021-06-30 DIAGNOSIS — M25552 Pain in left hip: Secondary | ICD-10-CM | POA: Insufficient documentation

## 2021-06-30 DIAGNOSIS — M5442 Lumbago with sciatica, left side: Secondary | ICD-10-CM

## 2021-06-30 DIAGNOSIS — G8929 Other chronic pain: Secondary | ICD-10-CM

## 2021-06-30 DIAGNOSIS — M5432 Sciatica, left side: Secondary | ICD-10-CM | POA: Insufficient documentation

## 2021-06-30 DIAGNOSIS — R52 Pain, unspecified: Secondary | ICD-10-CM

## 2021-06-30 DIAGNOSIS — Z760 Encounter for issue of repeat prescription: Secondary | ICD-10-CM

## 2021-06-30 DIAGNOSIS — Z79899 Other long term (current) drug therapy: Secondary | ICD-10-CM | POA: Insufficient documentation

## 2021-06-30 LAB — POC URINE PREG, ED: Preg Test, Ur: NEGATIVE

## 2021-06-30 MED ORDER — HYDROCODONE-ACETAMINOPHEN 5-325 MG PO TABS: 1.0000 | ORAL_TABLET | Freq: Four times a day (QID) | ORAL | 0 refills | Status: DC | PRN

## 2021-06-30 MED ORDER — CYCLOBENZAPRINE HCL 5 MG PO TABS: 5.0000 mg | ORAL_TABLET | Freq: Three times a day (TID) | ORAL | 0 refills | Status: DC | PRN

## 2021-06-30 MED ORDER — PREDNISONE 10 MG PO TABS: ORAL_TABLET | ORAL | 0 refills | Status: DC

## 2021-06-30 MED ORDER — ALBUTEROL SULFATE HFA 108 (90 BASE) MCG/ACT IN AERS: 1.0000 | INHALATION_SPRAY | Freq: Four times a day (QID) | RESPIRATORY_TRACT | 0 refills | Status: DC | PRN

## 2021-06-30 NOTE — ED Notes (Signed)
Pt ambulates unassisted with a limp.

## 2021-06-30 NOTE — ED Triage Notes (Signed)
Pt to the ED with c/o Bilateral hip pain x 2 months. Pt just established medical insurance.

## 2021-06-30 NOTE — ED Provider Notes (Signed)
[facility] EMERGENCY DEPARTMENT Provider Note   CSN: [id number] Arrival date & time: 06/30/21  1000     History Chief Complaint  Patient presents with   Hip Pain    Kelsey [name] is a 40 y.o. female presenting for evaluation of persistent low back pain and bilateral hip and groin pain after falling approximately 2 months ago.  She tripped over her dog and landed on her left hip initially.  She has been able to ambulate but continues to have persistent pain both in her lower back but also with radiation of pain into her lateral hips and medial groin when severe.  She has employed rest, ibuprofen and Tylenol without any improvement in her pain symptoms.  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  She denies numbness or weakness in her legs, but states she has to pick up her left leg to get into her car secondary to pain.  She has had no urinary or fecal incontinence or retention.  She denies any perineal numbness.  She is searching for a new primary provider as she is recently become eligible for insurance, unfortunately has not been will find a provider able to see her earlier than November.  The history is provided by the patient.      Past Medical History:  Diagnosis Date   Anxiety    Asthma    hosp. 2018 and 2019 no h/o intubation    Chronic bronchitis (HCC)    "get it pretty much q yr" (12/13/2015)   Depression    Depression    GERD (gastroesophageal reflux disease)    Insomnia    Palpitations    Pneumonia    "multiple times when I was a child; once as an adult so far" (12/13/2015)   Pre-syncope    PVC (premature ventricular contraction)    Seasonal allergies    Seizures (HCC)    thought 2/2 benzo w/d after overuse 03/29/21   Tachycardia     Patient Active Problem List   Diagnosis Date Noted   Anal skin tag 10/21/2019   Anxiety and depression 02/20/2018   Disorder of vulva 02/20/2018   Asthma  exacerbation 01/05/2018   Internal hemorrhoids 09/26/2017   History of alcoholism (HCC) 06/13/2017   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   Perianal abscess 11/22/2016   Asthma 10/15/2015   Allergic rhinitis 10/15/2015   Alcohol abuse 07/14/2015   GAD (generalized anxiety disorder) 07/14/2015   Depression, major, recurrent, moderate (HCC) 07/14/2015   Periscapular pain 01/19/2015   Left-sided thoracic back pain-greater than 20 years duration 01/12/2015   GERD (gastroesophageal reflux disease) 08/22/2013   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     Family History  Problem Relation Age of Onset   Heart disease Father    Rheumatologic disease Mother    Arthritis Mother    Asthma Mother    Depression  Mother    Lung cancer Paternal Grandmother    Asthma Maternal Grandmother    Hypertension Maternal Grandmother    Heart disease Maternal Grandfather     Social History   Tobacco Use   Smoking status: Every Day    Packs/day: 0.50    Types: Cigarettes   Smokeless tobacco: Never  Vaping Use   Vaping Use: Never used  Substance Use Topics   Alcohol use: No   Drug use: No    Home Medications Prior to Admission medications   Medication Sig Start Date End Date Taking? Authorizing Provider  albuterol (VENTOLIN HFA) 108 (90 Base) MCG/ACT inhaler Inhale 1-2 puffs into the lungs every 6 (six) hours as needed for wheezing or shortness of breath. 06/30/21  Yes Colt Martelle,  Raynelle Fanning, PA-C  cyclobenzaprine (FLEXERIL) 5 MG tablet Take 1 tablet (5 mg total) by mouth 3 (three) times daily as needed for muscle spasms. 06/30/21  Yes Marcelle Hepner, Raynelle Fanning, PA-C  HYDROcodone-acetaminophen (NORCO/VICODIN) 5-325 MG tablet Take 1 tablet by mouth every 6 (six) hours as needed. 06/30/21  Yes Nairi Oswald, Raynelle Fanning, PA-C  predniSONE (DELTASONE) 10 MG tablet 6, 5, 4, 3, 2 then 1 tablet by mouth daily for 6 days total. 06/30/21  Yes Charna Neeb, Raynelle Fanning, PA-C  ALPRAZolam Prudy Feeler) 1 MG tablet Take 1 tablet (1 mg total) by mouth at bedtime as needed for anxiety. 10/02/20   Devoria Albe, MD  FLUoxetine HCl 60 MG TABS Take 60 mg by mouth daily. 02/20/18   McLean-Scocuzza, Pasty Spillers, MD  fluticasone furoate-vilanterol (BREO ELLIPTA) 100-25 MCG/INH AEPB Inhale 1 puff into the lungs daily. Dispensed as samle exp 8/19 lot [id number] 02/22/18   McLean-Scocuzza, Pasty Spillers, MD  gabapentin (NEURONTIN) 300 MG capsule TAKE 2 CAPSULES (600 MG TOTAL) BY MOUTH 3 (THREE) TIMES DAILY. 05/03/18   McLean-Scocuzza, Pasty Spillers, MD  levocetirizine (XYZAL) 5 MG tablet Take 1 tablet (5 mg total) by mouth daily as needed for allergies. 09/25/19   McLean-Scocuzza, Pasty Spillers, MD  levonorgestrel (MIRENA) 20 MCG/24HR IUD 1 Intra Uterine Device (1 each total) by Intrauterine route once. 07/16/15   Adonis Brook, NP  montelukast (SINGULAIR) 10 MG tablet Take 1 tablet (10 mg total) by mouth at bedtime. 09/25/19   McLean-Scocuzza, Pasty Spillers, MD  traZODone (DESYREL) 50 MG tablet Take 50 mg by mouth at bedtime as needed for sleep.     [provider]  atenolol (TENORMIN) 25 MG tablet Take 25 mg by mouth daily.  02/10/12  Duke Salvia, MD  diphenhydrAMINE (BENADRYL) 25 MG tablet Take by mouth daily as needed.    02/10/12  [provider]    Allergies    Penicillins  Review of Systems   Review of Systems  Constitutional:  Negative for chills and fever.  Respiratory: Negative.    Cardiovascular: Negative.   Gastrointestinal: Negative.   Genitourinary:  Negative.   Musculoskeletal:  Positive for arthralgias and back pain. Negative for joint swelling and myalgias.  Skin: Negative.   Neurological:  Negative for weakness and numbness.  All other systems reviewed and are negative.  Physical Exam Updated Vital Signs BP (!) 168/98 (BP Location: Right Arm)   Pulse 80   Temp 98.2 F (36.8 C) (Oral)   Resp 16   Ht 5\' 4"  (1.626 m)   Wt 74.8 kg   SpO2 100%   BMI 28.32 kg/m   Physical Exam Vitals and nursing note reviewed.  Constitutional:      Appearance: She is well-developed.  HENT:  Head: Normocephalic.  Eyes:     Conjunctiva/sclera: Conjunctivae normal.  Cardiovascular:     Rate and Rhythm: Normal rate.     Pulses: Normal pulses.     Comments: Pedal pulses normal. Pulmonary:     Effort: Pulmonary effort is normal.  Abdominal:     General: Bowel sounds are normal. There is no distension.     Palpations: Abdomen is soft. There is no mass.  Musculoskeletal:        General: Normal range of motion.     Cervical back: Normal range of motion and neck supple.     Lumbar back: Tenderness present. No swelling, edema or spasms.  Skin:    General: Skin is warm and dry.  Neurological:     General: No focal deficit present.     Mental Status: She is alert and oriented to person, place, and time.     Sensory: Sensation is intact. No sensory deficit.     Motor: No weakness, tremor or atrophy.     Deep Tendon Reflexes:     Reflex Scores:      Patellar reflexes are 2+ on the right side and 2+ on the left side.    Comments: No strength deficit noted in hip and knee flexor and extensor muscle groups.  Ankle flexion and extension intact.      ED Results / Procedures / Treatments   Labs (all labs ordered are listed, but only abnormal results are displayed) Labs Reviewed  POC URINE PREG, ED    EKG None  Radiology DG Lumbar Spine Complete  Result Date: 06/30/2021 CLINICAL DATA:  Pain after fall. EXAM: LUMBAR SPINE - COMPLETE 4+  VIEW COMPARISON:  10/27/2010. FINDINGS: Lumbar spine scoliosis concave left. Mild diffuse multilevel degenerative change. No acute bony abnormality identified. No evidence of fracture. IUD noted the pelvis. IMPRESSION: Mild lumbar spine scoliosis. Diffuse mild multilevel degenerative change. No acute abnormality. Electronically Signed   By: Maisie Fushomas  Register   On: 06/30/2021 12:28   DG HIPS BILAT WITH PELVIS 3-4 VIEWS  Result Date: 06/30/2021 CLINICAL DATA:  Status post fall 5 months ago. Low back pain since fall. EXAM: DG HIP (WITH OR WITHOUT PELVIS) 3-4V BILAT COMPARISON:  None. FINDINGS: There is no evidence of hip fracture or dislocation. There is no evidence of arthropathy or other focal bone abnormality. There is an IUD within the central pelvis. IMPRESSION: Negative. Electronically Signed   By: Signa Kellaylor  Stroud M.D.   On: 06/30/2021 12:29    Procedures Procedures   Medications Ordered in ED Medications - No data to display  ED Course  I have reviewed the triage vital signs and the nursing notes.  Pertinent labs & imaging results that were available during my care of the patient were reviewed by me and considered in my medical decision making (see chart for details).    MDM Rules/Calculators/A&P                           Imaging reviewed and discussed with patient.  Exam and history suggesting low back strain with sciatica.  She was placed on prednisone taper and Flexeril, hydrocodone was also prescribed for pain relief, caution regarding sedation.  She has no exam findings to suggest cauda equina or other surgical emergency.  She was given strict return precautions regarding her back pain issues.  Prior to discharge she expressed desire for a refill on her albuterol.  She denies any shortness of breath  or wheezing at this time but is out of this inhaler and until she can establish care with a new PCP would like a refill, this was provided her. Final Clinical Impression(s) / ED  Diagnoses Final diagnoses:  Pain  Chronic bilateral low back pain with bilateral sciatica  Encounter for medication refill    Rx / DC Orders ED Discharge Orders          Ordered    predniSONE (DELTASONE) 10 MG tablet        06/30/21 1327    cyclobenzaprine (FLEXERIL) 5 MG tablet  3 times daily PRN        06/30/21 1327    HYDROcodone-acetaminophen (NORCO/VICODIN) 5-325 MG tablet  Every 6 hours PRN        06/30/21 1327    albuterol (VENTOLIN HFA) 108 (90 Base) MCG/ACT inhaler  Every 6 hours PRN        06/30/21 1327             Burgess Amor, Cordelia Poche 06/30/21 1913    Cathren Laine, MD 07/11/21 1715

## 2021-06-30 NOTE — Discharge Instructions (Addendum)
Take the medications as directed.  Do not drive within 4 hours of taking hydrocodone as this will make you drowsy.  Avoid lifting,  Bending,  Twisting or any other activity that worsens your pain over the next week.  Apply a heating pad to your lower back for 20 minutes several times daily.  You should get rechecked if your symptoms are not better over the next week, or you develop increased pain,  Weakness in your leg(s) or loss of bladder or bowel function - these can be symptoms of a worsening condition.

## 2021-07-14 ENCOUNTER — Other Ambulatory Visit: Payer: Self-pay

## 2021-07-14 ENCOUNTER — Emergency Department (HOSPITAL_COMMUNITY): Payer: Commercial Managed Care - PPO

## 2021-07-14 ENCOUNTER — Emergency Department (HOSPITAL_COMMUNITY)
Admission: EM | Admit: 2021-07-14 | Discharge: 2021-07-14 | Disposition: A | Discharge status: Home / Self Care | Payer: Commercial Managed Care - PPO | Attending: Emergency Medicine | Admitting: Emergency Medicine

## 2021-07-14 DIAGNOSIS — F17210 Nicotine dependence, cigarettes, uncomplicated: Secondary | ICD-10-CM | POA: Diagnosis not present

## 2021-07-14 DIAGNOSIS — M25561 Pain in right knee: Secondary | ICD-10-CM | POA: Diagnosis not present

## 2021-07-14 DIAGNOSIS — J45909 Unspecified asthma, uncomplicated: Secondary | ICD-10-CM | POA: Diagnosis not present

## 2021-07-14 DIAGNOSIS — R569 Unspecified convulsions: Secondary | ICD-10-CM | POA: Diagnosis not present

## 2021-07-14 DIAGNOSIS — M25562 Pain in left knee: Secondary | ICD-10-CM | POA: Insufficient documentation

## 2021-07-14 DIAGNOSIS — Z79899 Other long term (current) drug therapy: Secondary | ICD-10-CM | POA: Insufficient documentation

## 2021-07-14 DIAGNOSIS — Z7951 Long term (current) use of inhaled steroids: Secondary | ICD-10-CM | POA: Insufficient documentation

## 2021-07-14 LAB — URINALYSIS, ROUTINE W REFLEX MICROSCOPIC
Bilirubin Urine: NEGATIVE
Glucose, UA: NEGATIVE mg/dL
Hgb urine dipstick: NEGATIVE
Ketones, ur: NEGATIVE mg/dL
Nitrite: NEGATIVE
Protein, ur: NEGATIVE mg/dL
Specific Gravity, Urine: 1.016 (ref 1.005–1.030)
pH: 5 (ref 5.0–8.0)

## 2021-07-14 LAB — COMPREHENSIVE METABOLIC PANEL
ALT: 15 U/L (ref 0–44)
AST: 18 U/L (ref 15–41)
Albumin: 3.4 g/dL — ABNORMAL LOW (ref 3.5–5.0)
Alkaline Phosphatase: 86 U/L (ref 38–126)
Anion gap: 9 (ref 5–15)
BUN: 14 mg/dL (ref 6–20)
CO2: 18 mmol/L — ABNORMAL LOW (ref 22–32)
Calcium: 8.9 mg/dL (ref 8.9–10.3)
Chloride: 107 mmol/L (ref 98–111)
Creatinine, Ser: 0.5 mg/dL (ref 0.44–1.00)
GFR, Estimated: >60 mL/min (ref >60)
Glucose, Bld: 91 mg/dL (ref 70–99)
Potassium: 4.2 mmol/L (ref 3.5–5.1)
Sodium: 134 mmol/L — ABNORMAL LOW (ref 135–145)
Total Bilirubin: 0.8 mg/dL (ref 0.3–1.2)
Total Protein: 6.8 g/dL (ref 6.5–8.1)

## 2021-07-14 LAB — CBC WITH DIFFERENTIAL/PLATELET
Abs Immature Granulocytes: 0.03 10*3/uL (ref 0.00–0.07)
Basophils Absolute: 0 10*3/uL (ref 0.0–0.1)
Basophils Relative: 0 %
Eosinophils Absolute: 0.2 10*3/uL (ref 0.0–0.5)
Eosinophils Relative: 2 %
HCT: 39.7 % (ref 36.0–46.0)
Hemoglobin: 12.7 g/dL (ref 12.0–15.0)
Immature Granulocytes: 0 %
Lymphocytes Relative: 18 %
Lymphs Abs: 1.6 10*3/uL (ref 0.7–4.0)
MCH: 33.2 pg (ref 26.0–34.0)
MCHC: 32 g/dL (ref 30.0–36.0)
MCV: 103.9 fL — ABNORMAL HIGH (ref 80.0–100.0)
Monocytes Absolute: 0.6 10*3/uL (ref 0.1–1.0)
Monocytes Relative: 7 %
Neutro Abs: 6.2 10*3/uL (ref 1.7–7.7)
Neutrophils Relative %: 73 %
Platelets: 307 10*3/uL (ref 150–400)
RBC: 3.82 MIL/uL — ABNORMAL LOW (ref 3.87–5.11)
RDW: 13.1 % (ref 11.5–15.5)
WBC: 8.6 10*3/uL (ref 4.0–10.5)
nRBC: 0 % (ref 0.0–0.2)

## 2021-07-14 LAB — PREGNANCY, URINE: Preg Test, Ur: NEGATIVE

## 2021-07-14 MED ORDER — PREDNISONE 20 MG PO TABS: ORAL_TABLET | ORAL | 0 refills | Status: DC

## 2021-07-14 MED ORDER — DIAZEPAM 5 MG PO TABS
5.0000 mg | ORAL_TABLET | Freq: Once | ORAL | Status: AC
  Administered 2021-07-14: 5 mg via ORAL
  Filled 2021-07-14: qty 1

## 2021-07-14 MED ORDER — LEVETIRACETAM 500 MG PO TABS: 500.0000 mg | ORAL_TABLET | Freq: Two times a day (BID) | ORAL | 0 refills | Status: DC

## 2021-07-14 MED ORDER — ACETAMINOPHEN 325 MG PO TABS
650.0000 mg | ORAL_TABLET | Freq: Once | ORAL | Status: AC
  Administered 2021-07-14: 650 mg via ORAL
  Filled 2021-07-14: qty 2

## 2021-07-14 MED ORDER — LEVETIRACETAM IN NACL 1000 MG/100ML IV SOLN
1000.0000 mg | Freq: Once | INTRAVENOUS | Status: AC
  Administered 2021-07-14: 1000 mg via INTRAVENOUS
  Filled 2021-07-14: qty 100

## 2021-07-14 MED ORDER — KETOROLAC TROMETHAMINE 30 MG/ML IJ SOLN
30.0000 mg | Freq: Once | INTRAMUSCULAR | Status: AC
  Administered 2021-07-14: 30 mg via INTRAVENOUS
  Filled 2021-07-14: qty 1

## 2021-07-14 NOTE — ED Provider Notes (Signed)
[facility] EMERGENCY DEPARTMENT Provider Note  CSN: [id number] Arrival date & time: 07/14/21 1041    History Chief Complaint  Patient presents with   Seizures    HAEVEN NICKLE is a 40 y.o. female with history of anxiety has been on Xanax for several years, currently prescribed 1mg  TID. She was at work today when she had an episode of possible seizure-like activity. She does not recall the event, but coworkers told her that as she was sitting at a computer, she suddenly stiffened and would not respond to them. There was possible some shaking of her arm, but no generalized tonic activity as far as she knows. She was see in the [facility] for seizure-like activity about 9 months ago at that time felt to be related to running out of her Xanax early. She reports she has been trying to come off the Xanax recently, and has not been taking it for several days. She reports she has had similar seizure-like episodes occasionally over the last few months, whether she was taking the Xanax or not but has not sought medical care because until recently she did not have insurance. She reports she has had several months of bilateral hip pain, had xrays which were  neg and told it was sciatica. No other acute complaints today.    Past Medical History:  Diagnosis Date   Anxiety    Asthma    hosp. 2018 and 2019 no h/o intubation    Chronic bronchitis (HCC)    "get it pretty much q yr" (12/13/2015)   Depression    Depression    GERD (gastroesophageal reflux disease)    Insomnia    Palpitations    Pneumonia    "multiple times when I was a child; once as an adult so far" (12/13/2015)   Pre-syncope    PVC (premature ventricular contraction)    Seasonal allergies    Seizures (HCC)    thought 2/2 benzo w/d after overuse 03/29/21   Tachycardia     Past Surgical History:  Procedure Laterality Date   CESAREAN SECTION  10/15/2009, 02/08/2006   INCISION AND DRAINAGE PERIRECTAL ABSCESS Bilateral 11/22/2016    Procedure: IRRIGATION AND DEBRIDEMENT PERIRECTAL ABSCESS;  Surgeon: 11/24/2016, MD;  Location: [facility];  Service: General;  Laterality: Bilateral;   INTRAUTERINE DEVICE INSERTION     Mirena 12/24/19 exp 12/28/21 Dr 02/25/22 Luther Parody ob/gyn    Family History  Problem Relation Age of Onset   Heart disease Father    Rheumatologic disease Mother    Arthritis Mother    Asthma Mother    Depression Mother    Lung cancer Paternal Grandmother    Asthma Maternal Grandmother    Hypertension Maternal Grandmother    Heart disease Maternal Grandfather     Social History   Tobacco Use   Smoking status: Every Day    Packs/day: 0.50    Types: Cigarettes   Smokeless tobacco: Never  Vaping Use   Vaping Use: Never used  Substance Use Topics   Alcohol use: No   Drug use: No     Home Medications Prior to Admission medications   Medication Sig Start Date End Date Taking? Authorizing Provider  levETIRAcetam (KEPPRA) 500 MG tablet Take 1 tablet (500 mg total) by mouth 2 (two) times daily. 07/14/21  Yes 07/16/21, MD  albuterol (VENTOLIN HFA) 108 (90 Base) MCG/ACT inhaler Inhale 1-2 puffs into the lungs every 6 (six) hours as needed for wheezing or  shortness of breath. 06/30/21   Burgess Amor, PA-C  ALPRAZolam Prudy Feeler) 1 MG tablet Take 1 tablet (1 mg total) by mouth at bedtime as needed for anxiety. 10/02/20   Devoria Albe, MD  cyclobenzaprine (FLEXERIL) 5 MG tablet Take 1 tablet (5 mg total) by mouth 3 (three) times daily as needed for muscle spasms. 06/30/21   Burgess Amor, PA-C  FLUoxetine HCl 60 MG TABS Take 60 mg by mouth daily. 02/20/18   McLean-Scocuzza, Pasty Spillers, MD  fluticasone furoate-vilanterol (BREO ELLIPTA) 100-25 MCG/INH AEPB Inhale 1 puff into the lungs daily. Dispensed as samle exp 8/19 lot [id number] 02/22/18   McLean-Scocuzza, Pasty Spillers, MD  gabapentin (NEURONTIN) 300 MG capsule TAKE 2 CAPSULES (600 MG TOTAL) BY MOUTH 3 (THREE) TIMES DAILY. 05/03/18   McLean-Scocuzza, Pasty Spillers, MD   HYDROcodone-acetaminophen (NORCO/VICODIN) 5-325 MG tablet Take 1 tablet by mouth every 6 (six) hours as needed. 06/30/21   Burgess Amor, PA-C  levocetirizine (XYZAL) 5 MG tablet Take 1 tablet (5 mg total) by mouth daily as needed for allergies. 09/25/19   McLean-Scocuzza, Pasty Spillers, MD  levonorgestrel (MIRENA) 20 MCG/24HR IUD 1 Intra Uterine Device (1 each total) by Intrauterine route once. 07/16/15   Adonis Brook, NP  montelukast (SINGULAIR) 10 MG tablet Take 1 tablet (10 mg total) by mouth at bedtime. 09/25/19   McLean-Scocuzza, Pasty Spillers, MD  predniSONE (DELTASONE) 10 MG tablet 6, 5, 4, 3, 2 then 1 tablet by mouth daily for 6 days total. 06/30/21   Idol, Raynelle Fanning, PA-C  traZODone (DESYREL) 50 MG tablet Take 50 mg by mouth at bedtime as needed for sleep.     [provider]  atenolol (TENORMIN) 25 MG tablet Take 25 mg by mouth daily.  02/10/12  Duke Salvia, MD  diphenhydrAMINE (BENADRYL) 25 MG tablet Take by mouth daily as needed.    02/10/12  [provider]     Allergies    Penicillins   Review of Systems   Review of Systems A comprehensive review of systems was completed and negative except as noted in HPI.    Physical Exam BP (!) 156/96   Pulse 84   Temp 97.9 F (36.6 C) (Oral)   Resp 18   Ht 5\' 4"  (1.626 m)   Wt 74.8 kg   SpO2 99%   BMI 28.31 kg/m   Physical Exam Vitals and nursing note reviewed.  Constitutional:      Appearance: Normal appearance.  HENT:     Head: Normocephalic and atraumatic.     Nose: Nose normal.     Mouth/Throat:     Mouth: Mucous membranes are moist.  Eyes:     Extraocular Movements: Extraocular movements intact.     Conjunctiva/sclera: Conjunctivae normal.  Cardiovascular:     Rate and Rhythm: Normal rate.  Pulmonary:     Effort: Pulmonary effort is normal.     Breath sounds: Normal breath sounds.  Abdominal:     General: Abdomen is flat.     Palpations: Abdomen is soft.     Tenderness: There is no abdominal tenderness.   Musculoskeletal:        General: No swelling. Normal range of motion.     Cervical back: Neck supple.  Skin:    General: Skin is warm and dry.  Neurological:     General: No focal deficit present.     Mental Status: She is alert and oriented to person, place, and time.     Cranial Nerves: No cranial nerve  deficit.     Sensory: No sensory deficit.     Motor: No weakness.  Psychiatric:        Mood and Affect: Mood normal.     ED Results / Procedures / Treatments   Labs (all labs ordered are listed, but only abnormal results are displayed) Labs Reviewed  COMPREHENSIVE METABOLIC PANEL - Abnormal; Notable for the following components:      Result Value   Sodium 134 (*)    CO2 18 (*)    Albumin 3.4 (*)    All other components within normal limits  CBC WITH DIFFERENTIAL/PLATELET - Abnormal; Notable for the following components:   RBC 3.82 (*)    MCV 103.9 (*)    All other components within normal limits  URINALYSIS, ROUTINE W REFLEX MICROSCOPIC - Abnormal; Notable for the following components:   APPearance HAZY (*)    Leukocytes,Ua SMALL (*)    Bacteria, UA RARE (*)    All other components within normal limits  PREGNANCY, URINE    EKG EKG Interpretation  Date/Time:  Thursday July 14 2021 10:57:13 EDT Ventricular Rate:  80 PR Interval:  139 QRS Duration: 78 QT Interval:  378 QTC Calculation: 436 R Axis:   33 Text Interpretation: Sinus rhythm Anteroseptal infarct, age indeterminate No significant change since last tracing Confirmed by Susy FrizzleSheldon, Gaylon Bentz [phone]) on 07/14/2021 11:23:54 AM  Radiology CT Head Wo Contrast  Result Date: 07/14/2021 CLINICAL DATA:  Seizure. EXAM: CT HEAD WITHOUT CONTRAST TECHNIQUE: Contiguous axial images were obtained from the base of the skull through the vertex without intravenous contrast. COMPARISON:  April 27, 2016. FINDINGS: Brain: No evidence of acute infarction, hemorrhage, hydrocephalus, extra-axial collection or mass lesion/mass effect.  Vascular: No hyperdense vessel or unexpected calcification. Skull: Normal. Negative for fracture or focal lesion. Sinuses/Orbits: No acute finding. Other: None. IMPRESSION: No acute intracranial abnormality seen. Electronically Signed   By: Lupita RaiderJames  Green Jr M.D.   On: 07/14/2021 13:27    Procedures Procedures  Medications Ordered in the ED Medications  ketorolac (TORADOL) 30 MG/ML injection 30 mg (30 mg Intravenous Given 07/14/21 1140)  levETIRAcetam (KEPPRA) IVPB 1000 mg/100 mL premix (0 mg Intravenous Stopped 07/14/21 1350)  diazepam (VALIUM) tablet 5 mg (5 mg Oral Given 07/14/21 1336)  acetaminophen (TYLENOL) tablet 650 mg (650 mg Oral Given 07/14/21 1401)     MDM Rules/Calculators/A&P MDM Patient with possible seizure-like activity today at work, may be related to stopping her Xanax cold-turkey although she reports similar episodes even when taking the medicine. Will check labs, has not had any neuro-imaging thusfar so will check CT head. Currently awake and alert. Complaining of some aching pain in hips, toradol for comfort.   ED Course  I have reviewed the triage vital signs and the nursing notes.  Pertinent labs & imaging results that were available during my care of the patient were reviewed by me and considered in my medical decision making (see chart for details).  Clinical Course as of 07/14/21 1445  Thu Jul 14, 2021  1207 Called to bedside by RN for patient having another episode. RN noted she was unresponsive and staring over to the left side. No tonic-clonic activity. Lasted for <101min and then resolved. She was able to blink, look around and follow commands within 2-343minutes of the episode stopping.  [CS]  1242 CBC is normal, HCG is neg.  [CS]  1303 UA is neg.  [CS]  1319 Spoke with Dr. Amada JupiterKirkpatrick, Neurology, who recommends loading with Keppra and giving  a dose of long acting benzo. If remainder of workup is negative, he feels she can follow up as an outpatient. [CS]  1401 CMP  is normal. Patient amenable to plan of initiating Keppra, she will discuss tapering her Xanax with Dr. Evelene Croon instead of stopping it cold-turkey. Follow up with Neurology. Advised not drive for at least 6 months or until cleared by Neurology.  [CS]    Clinical Course User Index [CS] Pollyann Savoy, MD    Final Clinical Impression(s) / ED Diagnoses Final diagnoses:  Seizure-like activity [facility])    Rx / DC Orders ED Discharge Orders          Ordered    levETIRAcetam (KEPPRA) 500 MG tablet  2 times daily        07/14/21 1445             Pollyann Savoy, MD 07/14/21 1445

## 2021-07-14 NOTE — Discharge Instructions (Addendum)
Please do not drive for 6 months after your last seizure or until cleared by Neurology. Begin taking Keppra daily to prevent Seizures. Talk to Dr. Evelene Croon about tapering your Xanax instead of stopping cold-turkey.

## 2021-07-14 NOTE — ED Triage Notes (Signed)
Bt BIB [facility] from work today. Pt works as a Buyer, retail at [employer]. And while sitting down and charting, pt's coworkers witnessed 2-3 focal seizures that lasted about 10-20 seconds each. Pt does have a hx of this occurring-stated her last one was "maybe a week ago". 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 No trauma noted to patient. Pt did not fall nor hit head. Pt is AOX4, just a bit sleepy. All VSS. NAD at this time.

## 2021-07-28 ENCOUNTER — Other Ambulatory Visit: Payer: Self-pay

## 2021-07-28 ENCOUNTER — Encounter: Payer: Self-pay | Admitting: Internal Medicine

## 2021-07-28 ENCOUNTER — Ambulatory Visit (INDEPENDENT_AMBULATORY_CARE_PROVIDER_SITE_OTHER): Payer: Commercial Managed Care - PPO | Admitting: Internal Medicine

## 2021-07-28 VITALS — BP 130/99 | HR 109 | Temp 98.4°F | Resp 18 | Ht 64.0 in | Wt 165.1 lb

## 2021-07-28 DIAGNOSIS — Z7689 Persons encountering health services in other specified circumstances: Secondary | ICD-10-CM

## 2021-07-28 DIAGNOSIS — Z1159 Encounter for screening for other viral diseases: Secondary | ICD-10-CM

## 2021-07-28 DIAGNOSIS — F331 Major depressive disorder, recurrent, moderate: Secondary | ICD-10-CM

## 2021-07-28 DIAGNOSIS — I10 Essential (primary) hypertension: Secondary | ICD-10-CM | POA: Insufficient documentation

## 2021-07-28 DIAGNOSIS — J4520 Mild intermittent asthma, uncomplicated: Secondary | ICD-10-CM | POA: Diagnosis not present

## 2021-07-28 DIAGNOSIS — M130 Polyarthritis, unspecified: Secondary | ICD-10-CM | POA: Insufficient documentation

## 2021-07-28 DIAGNOSIS — M25551 Pain in right hip: Secondary | ICD-10-CM

## 2021-07-28 DIAGNOSIS — F411 Generalized anxiety disorder: Secondary | ICD-10-CM | POA: Diagnosis not present

## 2021-07-28 DIAGNOSIS — Z720 Tobacco use: Secondary | ICD-10-CM

## 2021-07-28 DIAGNOSIS — G40909 Epilepsy, unspecified, not intractable, without status epilepticus: Secondary | ICD-10-CM | POA: Insufficient documentation

## 2021-07-28 DIAGNOSIS — G8929 Other chronic pain: Secondary | ICD-10-CM | POA: Insufficient documentation

## 2021-07-28 DIAGNOSIS — M25552 Pain in left hip: Secondary | ICD-10-CM

## 2021-07-28 MED ORDER — TELMISARTAN 20 MG PO TABS: 20.0000 mg | ORAL_TABLET | Freq: Every day | ORAL | 0 refills | Status: DC

## 2021-07-28 MED ORDER — ALBUTEROL SULFATE HFA 108 (90 BASE) MCG/ACT IN AERS: 1.0000 | INHALATION_SPRAY | Freq: Four times a day (QID) | RESPIRATORY_TRACT | 0 refills | Status: DC | PRN

## 2021-07-28 NOTE — Assessment & Plan Note (Signed)
Followed by Psychiatry - Dr Evelene Croon On Xanax PRN

## 2021-07-28 NOTE — Progress Notes (Signed)
New Patient Office Visit  Subjective:  Patient ID: Kelsey [name], female    DOB: 02-Apr-1981  Age: 40 y.o. MRN: [id number]  CC:  Chief Complaint  Patient presents with   New Patient (Initial Visit)    New patient was being seen in [place] but she has not seen pcp in awhile she has been having hip pain was told this was sciatic and arthritis also having bp issues and its been running high for about 2 weeks also has been having seizures since nov and she is out of inhaler for asthma     HPI IEISHA [name] is a 40 year old female with PMH of HTN, asthma, GERD, seizure disorder, depression with anxiety, chronic back and hip pain and tobacco abuse who presents for establishing care.  Her blood pressure was elevated in the office today.  She states that her BP runs even higher at home and at her work.  She has episodes of headache, which intermittent, dull and generalized headache.  Denies any chest pain or palpitations.  She has a history of asthma, for which she uses albuterol inhaler as needed.  She needs it about once in 2 weeks.  Denies any dyspnea or wheezing currently.  She was recently treated with oral steroids for asthma exacerbation.  She has had multiple episodes of seizures in last 1 year, for which she has had ER visits.  Of note, she takes Xanax for anxiety and one of the episode was due to BZD withdrawal.  She has a history of alcohol abuse, but states that she quit alcohol in 03/2021.  She is currently taking Keppra 500 mg twice daily.  She has not had any Neurology evaluation.  She c/o chronic b/l hip and back pain.  Her x-ray of the lumbar spine showed DDD of lumbar spine.  She also reports chronic shoulder pain.  Her mother has h/o RA.  She has a history of anxiety with depression, and follows up with Dr. Liston Alba.  She takes Prozac, gabapentin and as needed Xanax.  Denies any SI or HI currently.  She has been dealing with stress at home.  She smokes about 0.5 pack/day  and is trying to cut down.  She has had 2 doses of COVID-vaccine.  She is going to get flu vaccine at her workplace ([employer]).  Past Medical History:  Diagnosis Date   Anxiety    Asthma    hosp. 2018 and 2019 no h/o intubation    Chronic bronchitis ([place])    "get it pretty much q yr" (12/13/2015)   Depression    Depression    GERD (gastroesophageal reflux disease)    Insomnia    Palpitations    Perianal abscess 11/22/2016   12/07/2016.  [facility], [place].  [name], MD.  Perianal abscess is much better but still needs local wound care.  Continue sitz baths 2-3 times perday until all drainage and pain are gone.  Follow up as needed.   Pneumonia    "multiple times when I was a child; once as an adult so far" (12/13/2015)   Pre-syncope    PVC (premature ventricular contraction)    Seasonal allergies    Seizures ([place])    thought 2/2 benzo w/d after overuse 03/29/21   Tachycardia     Past Surgical History:  Procedure Laterality Date   CESAREAN SECTION  10/15/2009, 02/08/2006   INCISION AND DRAINAGE PERIRECTAL ABSCESS Bilateral 11/22/2016   Procedure: IRRIGATION AND DEBRIDEMENT PERIRECTAL ABSCESS;  Surgeon: [name], MD;  Location: [facility];  Service: General;  Laterality: Bilateral;   INTRAUTERINE DEVICE INSERTION     Mirena 12/24/19 exp 12/28/21 Dr Casimer Bilis [name] ob/gyn    Family History  Problem Relation Age of Onset   Heart disease Father    Rheumatologic disease Mother    Arthritis Mother    Asthma Mother    Depression Mother    Lung cancer Paternal Grandmother    Asthma Maternal Grandmother    Hypertension Maternal Grandmother    Heart disease Maternal Grandfather     Social History   Socioeconomic History   Marital status: Married    Spouse name: Not on file   Number of children: 2   Years of education: Not on file   Highest education level: Not on file  Occupational History   Occupation: respiratory therapists    Employer:  [employer]  Tobacco Use   Smoking status: Every Day    Packs/day: 0.50    Types: Cigarettes   Smokeless tobacco: Never  Vaping Use   Vaping Use: Never used  Substance and Sexual Activity   Alcohol use: No   Drug use: No   Sexual activity: Not on file  Other Topics Concern   Not on file  Social History Narrative   Respiratory Therapist, 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 Negative for chills and fever.  HENT:  Negative for congestion, sinus pressure, sinus pain and sore throat.   Eyes:  Negative for pain and discharge.  Respiratory:  Negative for cough, shortness of breath and wheezing.   Cardiovascular:  Negative for chest pain and palpitations.  Gastrointestinal:  Negative for abdominal pain, constipation, diarrhea, nausea and vomiting.  Endocrine: Negative for polydipsia and polyuria.  Genitourinary:  Negative for dysuria and hematuria.  Musculoskeletal:  Positive for arthralgias and back pain. Negative for neck pain and neck stiffness.  Skin:  Negative for rash.  Neurological:  Positive for seizures. Negative for dizziness and weakness.  Psychiatric/Behavioral:  Positive for sleep disturbance. Negative for agitation and behavioral problems. The patient is nervous/anxious.    Objective:   Today's Vitals: BP (!) 130/99 (BP Location: Left Arm, Patient Position: Sitting, Cuff Size: Normal)   Pulse (!) 109   Temp 98.4 F (36.9 C) (Oral)   Resp 18   Ht _0  (1.626 m)   Wt 165 lb 1.9 oz (74.9 kg)   SpO2 96%   BMI  28.34 kg/m   Physical Exam Vitals reviewed.  Constitutional:      General: She is not in acute distress.    Appearance: She is not diaphoretic.  HENT:     Head: Normocephalic and atraumatic.     Nose: Nose normal.     Mouth/Throat:     Mouth: Mucous membranes are moist.  Eyes:     General: No scleral icterus.    Extraocular Movements: Extraocular movements intact.  Cardiovascular:  Rate and Rhythm: Normal rate and regular rhythm.     Pulses: Normal pulses.     Heart sounds: Normal heart sounds. No murmur heard. Pulmonary:     Breath sounds: Normal breath sounds. No wheezing or rales.  Abdominal:     Palpations: Abdomen is soft.     Tenderness: There is no abdominal tenderness.  Musculoskeletal:     Cervical back: Neck supple. No tenderness.     Right lower leg: No edema.     Left lower leg: No edema.  Skin:    General: Skin is warm.     Findings: No rash.  Neurological:     General: No focal deficit present.     Mental Status: She is alert and oriented to person, place, and time.     Sensory: No sensory deficit.     Motor: No weakness.  Psychiatric:        Mood and Affect: Mood normal.        Behavior: Behavior normal.    Assessment & Plan:   Problem List Items Addressed This Visit       Encounter to establish care - Primary   Care established History and medications reviewed with the patient     Relevant Orders  CBC with Differential/Platelet  CMP14+EGFR  Lipid panel  TSH    Cardiovascular and Mediastinum   Essential hypertension    BP Readings from Last 1 Encounters:  07/28/21 (!) 130/99  New-onset She states that her blood pressure runs higher at home and at work Started Telmisartan 20 mg QD Counseled for compliance with the medications Advised DASH diet and moderate exercise/walking, at least 150 mins/week       Relevant Medications   telmisartan (MICARDIS) 20 MG tablet   Other Relevant Orders   TSH     Respiratory   Asthma    Uses  Albuterol inhaler PRN Had asthma exacerbation recently, had oral steroids      Relevant Medications   albuterol (VENTOLIN HFA) 108 (90 Base) MCG/ACT inhaler     Nervous and Auditory   Seizure disorder (HCC)    On Keppra 500 mg BID Has had multiple episodes of seizures recently One episode was due to Benzodiazepine withdrawal Has quit alcohol since 03/2021      Relevant Orders   Ambulatory referral to Neurology     Musculoskeletal and Integument   Polyarthritis    B/l shoulder, hip and lumbar spine pain Mother has RA Check ANA, CRP, RF and AntiCCP antibody      Relevant Orders   ANA Direct w/Reflex if Positive   C-reactive protein   Rheumatoid Factor   CYCLIC CITRUL PEPTIDE ANTIBODY, IGG/IGA     Other   GAD (generalized anxiety disorder)    Followed by Psychiatry - Dr Toy Care On Xanax PRN      Relevant Orders   TSH   Depression, major, recurrent, moderate ([place])    Followed by Psychiatry - Dr Toy Care On Prozac and Gabapentin      Chronic hip pain    Hip pain leading to difficulty standing and gait problem Referred to Orthopedic surgery      Relevant Orders   Ambulatory referral to Orthopedic Surgery      Tobacco abuse    Smokes about 0.5 pack/day  Asked about quitting: confirms that she currently smokes cigarettes Advise to quit smoking: Educated about QUITTING to reduce the risk of cancer, cardio and cerebrovascular disease. Assess willingness: Unwilling to quit at this time,  but is working on cutting back. Assist with counseling and pharmacotherapy: Counseled for 5 minutes and literature provided. Arrange for follow up: Follow up in 3 months and continue to offer help.      Other Visit Diagnoses     Need for hepatitis C screening test       Relevant Orders   Hepatitis C Antibody       Outpatient Encounter Medications as of 07/28/2021  Medication Sig   ALPRAZolam (XANAX) 1 MG tablet Take 1 tablet (1 mg total) by mouth at bedtime as needed for anxiety.    FLUoxetine HCl 60 MG TABS Take 60 mg by mouth daily.   gabapentin (NEURONTIN) 300 MG capsule TAKE 2 CAPSULES (600 MG TOTAL) BY MOUTH 3 (THREE) TIMES DAILY.   levETIRAcetam (KEPPRA) 500 MG tablet Take 1 tablet (500 mg total) by mouth 2 (two) times daily.   levonorgestrel (MIRENA) 20 MCG/24HR IUD 1 Intra Uterine Device (1 each total) by Intrauterine route once.   predniSONE (DELTASONE) 20 MG tablet 3 Tabs PO Days 1-3, then 2 tabs PO Days 4-6, then 1 tab PO Day 7-9, then Half Tab PO Day 10-12   telmisartan (MICARDIS) 20 MG tablet Take 1 tablet (20 mg total) by mouth daily.   [DISCONTINUED] albuterol (VENTOLIN HFA) 108 (90 Base) MCG/ACT inhaler Inhale 1-2 puffs into the lungs every 6 (six) hours as needed for wheezing or shortness of breath.   albuterol (VENTOLIN HFA) 108 (90 Base) MCG/ACT inhaler Inhale 1-2 puffs into the lungs every 6 (six) hours as needed for wheezing or shortness of breath.   cyclobenzaprine (FLEXERIL) 5 MG tablet Take 1 tablet (5 mg total) by mouth 3 (three) times daily as needed for muscle spasms.   [DISCONTINUED] atenolol (TENORMIN) 25 MG tablet Take 25 mg by mouth daily.   [DISCONTINUED] diphenhydrAMINE (BENADRYL) 25 MG tablet Take by mouth daily as needed.     [DISCONTINUED] fluticasone furoate-vilanterol (BREO ELLIPTA) 100-25 MCG/INH AEPB Inhale 1 puff into the lungs daily. Dispensed as samle exp 8/19 lot [id number] (Patient not taking: Reported on 07/28/2021)   [DISCONTINUED] HYDROcodone-acetaminophen (NORCO/VICODIN) 5-325 MG tablet Take 1 tablet by mouth every 6 (six) hours as needed. (Patient not taking: Reported on 07/28/2021)   [DISCONTINUED] levocetirizine (XYZAL) 5 MG tablet Take 1 tablet (5 mg total) by mouth daily as needed for allergies. (Patient not taking: Reported on 07/28/2021)   [DISCONTINUED] montelukast (SINGULAIR) 10 MG tablet Take 1 tablet (10 mg total) by mouth at bedtime. (Patient not taking: Reported on 07/28/2021)   [DISCONTINUED] traZODone (DESYREL) 50 MG tablet  Take 50 mg by mouth at bedtime as needed for sleep.  (Patient not taking: Reported on 07/28/2021)   No facility-administered encounter medications on file as of 07/28/2021.    Follow-up: Return in about 4 weeks (around 08/25/2021) for HTN.   Lindell Spar, MD

## 2021-07-28 NOTE — Assessment & Plan Note (Signed)
BP Readings from Last 1 Encounters:  07/28/21 (!) 130/99   New-onset She states that her blood pressure runs higher at home and at work Started Telmisartan 20 mg QD Counseled for compliance with the medications Advised DASH diet and moderate exercise/walking, at least 150 mins/week

## 2021-07-28 NOTE — Assessment & Plan Note (Signed)
Smokes about 0.5 pack/day  Asked about quitting: confirms that she currently smokes cigarettes Advise to quit smoking: Educated about QUITTING to reduce the risk of cancer, cardio and cerebrovascular disease. Assess willingness: Unwilling to quit at this time, but is working on cutting back. Assist with counseling and pharmacotherapy: Counseled for 5 minutes and literature provided. Arrange for follow up: Follow up in 3 months and continue to offer help. 

## 2021-07-28 NOTE — Assessment & Plan Note (Signed)
Hip pain leading to difficulty standing and gait problem Referred to Orthopedic surgery

## 2021-07-28 NOTE — Assessment & Plan Note (Signed)
Followed by Psychiatry - Dr Evelene Croon On Prozac and Gabapentin

## 2021-07-28 NOTE — Assessment & Plan Note (Addendum)
On Keppra 500 mg BID Has had multiple episodes of seizures recently One episode was due to Benzodiazepine withdrawal Has quit alcohol since 03/2021

## 2021-07-28 NOTE — Patient Instructions (Addendum)
Please start taking Telmisartan for high blood pressure.  Please follow DASH diet and perform moderate exercise/walking at least 150 mins/week.  Please continue to take medications as prescribed.  You are being referred to Neurology and Orthopedic surgery.

## 2021-07-28 NOTE — Assessment & Plan Note (Signed)
Care established History and medications reviewed with the patient 

## 2021-07-28 NOTE — Assessment & Plan Note (Signed)
Uses Albuterol inhaler PRN Had asthma exacerbation recently, had oral steroids

## 2021-07-28 NOTE — Assessment & Plan Note (Signed)
B/l shoulder, hip and lumbar spine pain Mother has RA Check ANA, CRP, RF and AntiCCP antibody

## 2021-07-29 LAB — CBC WITH DIFFERENTIAL/PLATELET
Basophils Absolute: 0 10*3/uL (ref 0.0–0.2)
Basos: 1 %
EOS (ABSOLUTE): 0.1 10*3/uL (ref 0.0–0.4)
Eos: 2 %
Hematocrit: 46.5 % (ref 34.0–46.6)
Hemoglobin: 15.5 g/dL (ref 11.1–15.9)
Immature Grans (Abs): 0 10*3/uL (ref 0.0–0.1)
Immature Granulocytes: 1 %
Lymphocytes Absolute: 1.6 10*3/uL (ref 0.7–3.1)
Lymphs: 26 %
MCH: 33 pg (ref 26.6–33.0)
MCHC: 33.3 g/dL (ref 31.5–35.7)
MCV: 99 fL — ABNORMAL HIGH (ref 79–97)
Monocytes Absolute: 0.5 10*3/uL (ref 0.1–0.9)
Monocytes: 8 %
Neutrophils Absolute: 3.9 10*3/uL (ref 1.4–7.0)
Neutrophils: 62 %
Platelets: 362 10*3/uL (ref 150–450)
RBC: 4.7 x10E6/uL (ref 3.77–5.28)
RDW: 12.9 % (ref 11.7–15.4)
WBC: 6.1 10*3/uL (ref 3.4–10.8)

## 2021-07-29 LAB — LIPID PANEL
Chol/HDL Ratio: 4.6 ratio — ABNORMAL HIGH (ref 0.0–4.4)
Cholesterol, Total: 231 mg/dL — ABNORMAL HIGH (ref 100–199)
HDL: 50 mg/dL (ref >39)
LDL Chol Calc (NIH): 85 mg/dL (ref 0–99)
Triglycerides: 595 mg/dL (ref 0–149)
VLDL Cholesterol Cal: 96 mg/dL — ABNORMAL HIGH (ref 5–40)

## 2021-07-29 LAB — CMP14+EGFR
ALT: 25 IU/L (ref 0–32)
AST: 26 IU/L (ref 0–40)
Albumin/Globulin Ratio: 1.4 (ref 1.2–2.2)
Albumin: 4.6 g/dL (ref 3.8–4.8)
Alkaline Phosphatase: 130 IU/L — ABNORMAL HIGH (ref 44–121)
BUN/Creatinine Ratio: 21 (ref 9–23)
BUN: 14 mg/dL (ref 6–24)
Bilirubin Total: 0.3 mg/dL (ref 0.0–1.2)
CO2: 23 mmol/L (ref 20–29)
Calcium: 10 mg/dL (ref 8.7–10.2)
Chloride: 97 mmol/L (ref 96–106)
Creatinine, Ser: 0.68 mg/dL (ref 0.57–1.00)
Globulin, Total: 3.2 g/dL (ref 1.5–4.5)
Glucose: 81 mg/dL (ref 65–99)
Potassium: 4.6 mmol/L (ref 3.5–5.2)
Sodium: 137 mmol/L (ref 134–144)
Total Protein: 7.8 g/dL (ref 6.0–8.5)
eGFR: 113 mL/min/{1.73_m2} (ref >59)

## 2021-07-29 LAB — TSH: TSH: 3.66 u[IU]/mL (ref 0.450–4.500)

## 2021-07-29 LAB — HEPATITIS C ANTIBODY: Hep C Virus Ab: <0.1 s/co ratio (ref 0.0–0.9)

## 2021-07-30 LAB — ANA W/REFLEX IF POSITIVE
Anti JO-1: <0.2 AI (ref 0.0–0.9)
Anti Nuclear Antibody (ANA): POSITIVE — AB
Centromere Ab Screen: <0.2 AI (ref 0.0–0.9)
Chromatin Ab SerPl-aCnc: <0.2 AI (ref 0.0–0.9)
ENA RNP Ab: 0.2 AI (ref 0.0–0.9)
ENA SM Ab Ser-aCnc: <0.2 AI (ref 0.0–0.9)
ENA SSA (RO) Ab: 0.2 AI (ref 0.0–0.9)
ENA SSB (LA) Ab: <0.2 AI (ref 0.0–0.9)
Scleroderma (Scl-70) (ENA) Antibody, IgG: <0.2 AI (ref 0.0–0.9)
dsDNA Ab: 11 IU/mL — ABNORMAL HIGH (ref 0–9)

## 2021-07-30 LAB — RHEUMATOID FACTOR: Rheumatoid fact SerPl-aCnc: <10 IU/mL (ref <14.0)

## 2021-07-30 LAB — C-REACTIVE PROTEIN: CRP: 5 mg/L (ref 0–10)

## 2021-07-30 LAB — CYCLIC CITRUL PEPTIDE ANTIBODY, IGG/IGA: Cyclic Citrullin Peptide Ab: 8 units (ref 0–19)

## 2021-07-31 ENCOUNTER — Other Ambulatory Visit: Payer: Self-pay | Admitting: Internal Medicine

## 2021-07-31 DIAGNOSIS — R768 Other specified abnormal immunological findings in serum: Secondary | ICD-10-CM

## 2021-07-31 DIAGNOSIS — E782 Mixed hyperlipidemia: Secondary | ICD-10-CM

## 2021-07-31 MED ORDER — ROSUVASTATIN CALCIUM 10 MG PO TABS: 10.0000 mg | ORAL_TABLET | Freq: Every day | ORAL | 1 refills | Status: DC

## 2021-08-09 ENCOUNTER — Ambulatory Visit: Payer: Commercial Managed Care - PPO | Admitting: Orthopaedic Surgery

## 2021-08-15 ENCOUNTER — Ambulatory Visit: Payer: Commercial Managed Care - PPO | Admitting: Nurse Practitioner

## 2021-08-16 ENCOUNTER — Other Ambulatory Visit: Payer: Self-pay

## 2021-08-16 ENCOUNTER — Encounter: Payer: Self-pay | Admitting: Orthopaedic Surgery

## 2021-08-16 ENCOUNTER — Ambulatory Visit (INDEPENDENT_AMBULATORY_CARE_PROVIDER_SITE_OTHER): Payer: Commercial Managed Care - PPO | Admitting: Orthopaedic Surgery

## 2021-08-16 ENCOUNTER — Ambulatory Visit (INDEPENDENT_AMBULATORY_CARE_PROVIDER_SITE_OTHER): Payer: Commercial Managed Care - PPO | Admitting: Internal Medicine

## 2021-08-16 ENCOUNTER — Encounter: Payer: Self-pay | Admitting: Internal Medicine

## 2021-08-16 VITALS — BP 156/100 | HR 93 | Temp 97.5°F | Resp 18 | Ht 64.0 in | Wt 168.1 lb

## 2021-08-16 VITALS — BP 165/123 | HR 95 | Ht 64.0 in | Wt 168.0 lb

## 2021-08-16 DIAGNOSIS — M7062 Trochanteric bursitis, left hip: Secondary | ICD-10-CM | POA: Diagnosis not present

## 2021-08-16 DIAGNOSIS — M545 Low back pain, unspecified: Secondary | ICD-10-CM | POA: Diagnosis not present

## 2021-08-16 DIAGNOSIS — M25551 Pain in right hip: Secondary | ICD-10-CM

## 2021-08-16 DIAGNOSIS — M79605 Pain in left leg: Secondary | ICD-10-CM | POA: Diagnosis not present

## 2021-08-16 DIAGNOSIS — M25552 Pain in left hip: Secondary | ICD-10-CM

## 2021-08-16 DIAGNOSIS — J4520 Mild intermittent asthma, uncomplicated: Secondary | ICD-10-CM

## 2021-08-16 DIAGNOSIS — I10 Essential (primary) hypertension: Secondary | ICD-10-CM | POA: Diagnosis not present

## 2021-08-16 DIAGNOSIS — E782 Mixed hyperlipidemia: Secondary | ICD-10-CM | POA: Insufficient documentation

## 2021-08-16 DIAGNOSIS — G8929 Other chronic pain: Secondary | ICD-10-CM

## 2021-08-16 MED ORDER — NAPROXEN 500 MG PO TABS
500.0000 mg | ORAL_TABLET | Freq: Two times a day (BID) | ORAL | 5 refills | Status: DC
Start: 2021-08-16 — End: 2022-01-04

## 2021-08-16 MED ORDER — ALBUTEROL SULFATE HFA 108 (90 BASE) MCG/ACT IN AERS: 1.0000 | INHALATION_SPRAY | Freq: Four times a day (QID) | RESPIRATORY_TRACT | 11 refills | Status: DC | PRN

## 2021-08-16 MED ORDER — TELMISARTAN-HCTZ 40-12.5 MG PO TABS: 1.0000 | ORAL_TABLET | Freq: Every day | ORAL | 2 refills | Status: DC

## 2021-08-16 MED ORDER — ROSUVASTATIN CALCIUM 10 MG PO TABS: 10.0000 mg | ORAL_TABLET | Freq: Every day | ORAL | 1 refills | Status: DC

## 2021-08-16 NOTE — Patient Instructions (Signed)
Central Scheduling [phone] call and schedule your MRI.

## 2021-08-16 NOTE — Assessment & Plan Note (Signed)
BP Readings from Last 1 Encounters:  08/16/21 (!) 156/100   New-onset, uncontrolled with Telmisartan 20 mg QD She states that her blood pressure runs higher at home and at work Switched to [place]ly-Giebel 40-12.5 mg QD Counseled for compliance with the medications Advised DASH diet and moderate exercise/walking, at least 150 mins/week

## 2021-08-16 NOTE — Assessment & Plan Note (Signed)
Lipid profile reviewed Started Crestor 

## 2021-08-16 NOTE — Progress Notes (Signed)
Acute Office Visit  Subjective:    Patient ID: Kelsey [name], female    DOB: [birth year], 40 y.o.   MRN: [id number]  Chief Complaint  Patient presents with   Follow-up    4 week follow up bp pt never received crestor but bp has been running about the same as its running today     HPI Patient is in today for f/u of HTN and HLD.  HTN: Her BP was elevated in the office today.  She complains of generalized headache at times.  She denies any chest pain or palpitations.  She has been taking Telmisartan regularly. Her BP remains around 140s/90s at her workplace.  She has not started taking Crestor as her Pharmacy said that they did not have the prescription. It was resent today.  Past Medical History:  Diagnosis Date   Anxiety    Asthma    hosp. 2018 and 2019 no h/o intubation    Chronic bronchitis (Kelsey [name])    "get it pretty much q yr" (12/13/2015)   Depression    Depression    GERD (gastroesophageal reflux disease)    Insomnia    Palpitations    Perianal abscess 11/22/2016   12/07/2016.  [facility], [place].  Kelsey Overall, MD.  Perianal abscess is much better but still needs local wound care.  Continue sitz baths 2-3 times perday until all drainage and pain are gone.  Follow up as needed.   Pneumonia    "multiple times when I was a child; once as an adult so far" (12/13/2015)   Pre-syncope    PVC (premature ventricular contraction)    Seasonal allergies    Seizures (HCC)    thought 2/2 benzo w/d after overuse 03/29/21   Tachycardia     Past Surgical History:  Procedure Laterality Date   CESAREAN SECTION  10/15/2009, 02/08/2006   INCISION AND DRAINAGE PERIRECTAL ABSCESS Bilateral 11/22/2016   Procedure: IRRIGATION AND DEBRIDEMENT PERIRECTAL ABSCESS;  Surgeon: Kelsey Overall, MD;  Location: [facility];  Service: General;  Laterality: Bilateral;   INTRAUTERINE DEVICE INSERTION     Mirena 12/24/19 exp 12/28/21 Dr Kelsey [name] Kelsey [name] ob/gyn    Family History  Problem  Relation Age of Onset   Heart disease Father    Rheumatologic disease Mother    Arthritis Mother    Asthma Mother    Depression Mother    Lung cancer Paternal Grandmother    Asthma Maternal Grandmother    Hypertension Maternal Grandmother    Heart disease Maternal Grandfather     Social History   Socioeconomic History   Marital status: Married    Spouse name: Not on file   Number of children: 2   Years of education: Not on file   Highest education level: Not on file  Occupational History   Occupation: respiratory therapists    Employer: [employer]  Tobacco Use   Smoking status: Every Day    Packs/day: 0.50    Types: Cigarettes   Smokeless tobacco: Never  Vaping Use   Vaping Use: Never used  Substance and Sexual Activity   Alcohol use: No   Drug use: No   Sexual activity: Not on file  Other Topics Concern   Not on file  Social History Narrative   Respiratory Therapist, 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 7 tablet 0   cyclobenzaprine (FLEXERIL) 5 MG tablet Take 1 tablet (5 mg total) by mouth 3 (three) times daily as needed for muscle spasms. 15 tablet 0   FLUoxetine (PROZAC) 20 MG capsule Take 60 mg by mouth every morning.     FLUoxetine HCl 60 MG TABS Take 60 mg by mouth daily. 30 tablet 11   gabapentin (NEURONTIN) 300 MG capsule TAKE 2 CAPSULES (600 MG TOTAL) BY MOUTH 3 (THREE) TIMES DAILY. 540  capsule 1   levETIRAcetam (KEPPRA) 500 MG tablet Take 1 tablet (500 mg total) by mouth 2 (two) times daily. 30 tablet 0   levonorgestrel (MIRENA) 20 MCG/24HR IUD 1 Intra Uterine Device (1 each total) by Intrauterine route once. 1 each 0   albuterol (VENTOLIN HFA) 108 (90 Base) MCG/ACT inhaler Inhale 1-2 puffs into the lungs every 6 (six) hours as needed for wheezing or shortness of breath. 1 each 0   telmisartan (MICARDIS) 20 MG tablet Take 1 tablet (20 mg total) by mouth daily. 30 tablet 0   naproxen (NAPROSYN) 500 MG tablet Take 1 tablet (500 mg total) by mouth 2 (two) times daily with a meal. (Patient not taking: Reported on 08/16/2021) 60 tablet 5   rosuvastatin (CRESTOR) 10 MG tablet Take 1 tablet (10 mg total) by mouth daily. (Patient not taking: No sig reported) 90 tablet 1   No facility-administered medications prior to visit.    Allergies  Allergen Reactions   Penicillins Rash    Has patient had a PCN reaction causing immediate rash, facial/tongue/throat swelling, SOB or lightheadedness with hypotension: No Has patient had a PCN reaction causing severe rash involving mucus membranes or skin necrosis: No Has patient had a PCN reaction that required hospitalization No Has patient had a PCN reaction occurring within the last 10 years: No If all of the above answers are "NO", then may proceed with Cephalosporin use.    Review of Systems  Constitutional:  Positive for fatigue. Negative for chills and fever.  HENT:  Negative for congestion, sinus pressure, sinus pain and sore throat.   Eyes:  Negative for pain and discharge.  Respiratory:  Negative for cough, shortness of breath and wheezing.   Cardiovascular:  Negative for chest pain and palpitations.  Gastrointestinal:  Negative for abdominal pain, constipation, diarrhea, nausea and vomiting.  Endocrine: Negative for polydipsia and polyuria.  Genitourinary:  Negative for dysuria and hematuria.  Musculoskeletal:  Positive for  arthralgias and back pain. Negative for neck pain and neck stiffness.  Skin:  Negative for rash.  Neurological:  Positive for seizures. Negative for dizziness and weakness.  Psychiatric/Behavioral:  Positive for sleep disturbance. Negative for agitation and behavioral problems. The patient is nervous/anxious.       Objective:    Physical Exam Vitals reviewed.  Constitutional:      General: She is not in acute distress.    Appearance: She is not diaphoretic.  HENT:     Head: Normocephalic and atraumatic.  Nose: Nose normal.     Mouth/Throat:     Mouth: Mucous membranes are moist.  Eyes:     General: No scleral icterus.    Extraocular Movements: Extraocular movements intact.  Cardiovascular:     Rate and Rhythm: Normal rate and regular rhythm.     Pulses: Normal pulses.     Heart sounds: Normal heart sounds. No murmur heard. Pulmonary:     Breath sounds: Normal breath sounds. No wheezing or rales.  Musculoskeletal:     Cervical back: Neck supple. No tenderness.     Right lower leg: No edema.     Left lower leg: No edema.  Skin:    General: Skin is warm.     Findings: No rash.  Neurological:     General: No focal deficit present.     Mental Status: She is alert and oriented to person, place, and time.     Sensory: No sensory deficit.     Motor: No weakness.  Psychiatric:        Mood and Affect: Mood normal.        Behavior: Behavior normal.    BP (!) 156/100 (BP Location: Left Arm, Patient Position: Sitting, Cuff Size: Normal)   Pulse 93   Temp (!) 97.5 F (36.4 C) (Oral)   Resp 18   Ht 5' 4"  (1.626 m)   Wt 168 lb 1.9 oz (76.3 kg)   SpO2 94%   BMI 28.86 kg/m  Wt Readings from Last 3 Encounters:  08/16/21 168 lb 1.9 oz (76.3 kg)  08/16/21 168 lb (76.2 kg)  07/28/21 165 lb 1.9 oz (74.9 kg)    Health Maintenance Due  Topic Date Due   TETANUS/TDAP  Never done   PAP SMEAR-Modifier  10/13/2016   COVID-19 Vaccine (3 - Mixed Product risk series) 01/24/2021    INFLUENZA VACCINE  06/27/2021    There are no preventive care reminders to display for this patient.   Lab Results  Component Value Date   TSH 3.660 07/28/2021   Lab Results  Component Value Date   WBC 6.1 07/28/2021   HGB 15.5 07/28/2021   HCT 46.5 07/28/2021   MCV 99 (H) 07/28/2021   PLT 362 07/28/2021   Lab Results  Component Value Date   NA 137 07/28/2021   K 4.6 07/28/2021   CO2 23 07/28/2021   GLUCOSE 81 07/28/2021   BUN 14 07/28/2021   CREATININE 0.68 07/28/2021   BILITOT 0.3 07/28/2021   ALKPHOS 130 (H) 07/28/2021   AST 26 07/28/2021   ALT 25 07/28/2021   PROT 7.8 07/28/2021   ALBUMIN 4.6 07/28/2021   CALCIUM 10.0 07/28/2021   ANIONGAP 9 07/14/2021   EGFR 113 07/28/2021   GFR 97.90 01/30/2020   Lab Results  Component Value Date   CHOL 231 (H) 07/28/2021   Lab Results  Component Value Date   HDL 50 07/28/2021   Lab Results  Component Value Date   LDLCALC 85 07/28/2021   Lab Results  Component Value Date   TRIG 595 (HH) 07/28/2021   Lab Results  Component Value Date   CHOLHDL 4.6 (H) 07/28/2021   No results found for: HGBA1C     Assessment & Plan:   Problem List Items Addressed This Visit       Cardiovascular and Mediastinum   Essential hypertension - Primary    BP Readings from Last 1 Encounters:  08/16/21 (!) 156/100  New-onset, uncontrolled with Telmisartan 20 mg QD She states that  her blood pressure runs higher at home and at work Switched to [employer] 40-12.5 mg QD Counseled for compliance with the medications Advised DASH diet and moderate exercise/walking, at least 150 mins/week      Relevant Medications   rosuvastatin (CRESTOR) 10 MG tablet   telmisartan-hydrochlorothiazide (MICARDIS HCT) 40-12.5 MG tablet     Respiratory   Asthma   Relevant Medications   albuterol (VENTOLIN HFA) 108 (90 Base) MCG/ACT inhaler     Other   Mixed hyperlipidemia    Lipid profile reviewed Started Crestor      Relevant  Medications   rosuvastatin (CRESTOR) 10 MG tablet   telmisartan-hydrochlorothiazide (MICARDIS HCT) 40-12.5 MG tablet     Meds ordered this encounter  Medications   rosuvastatin (CRESTOR) 10 MG tablet    Sig: Take 1 tablet (10 mg total) by mouth daily.    Dispense:  90 tablet    Refill:  1   albuterol (VENTOLIN HFA) 108 (90 Base) MCG/ACT inhaler    Sig: Inhale 1-2 puffs into the lungs every 6 (six) hours as needed for wheezing or shortness of breath.    Dispense:  1 each    Refill:  11   telmisartan-hydrochlorothiazide (MICARDIS HCT) 40-12.5 MG tablet    Sig: Take 1 tablet by mouth daily.    Dispense:  30 tablet    Refill:  2     Dontravious Camille Keith Rake, MD

## 2021-08-16 NOTE — Patient Instructions (Signed)
Please start taking Telmisartan-HCTZ instead of Telmisartan.  Please continue to follow low salt diet and perform moderate exercise/walking at least 150 mins/week.

## 2021-08-16 NOTE — Progress Notes (Signed)
Subjective:    Patient ID: Kelsey [name], female    DOB: 10/08/1981, 40 y.o.   MRN: [id number]  HPI She has pain in both hips and the lower back that has been present for more than three months.  She has tried ibuprofen 800 bid, Biofreeze, rest, heat, ice with little help. She has had X-rays of the hips and back done in early August.  I have reviewed the films and the notes.  She is not improving.  She has pain to the left hip and buttocks that goes to the knee but not beyond.  She has more pain lateral left hip that keeps her up at night. 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  The pain is limiting her activity.  She has no trauma, no redness.    I have independently reviewed and interpreted x-rays of this patient done at another site by another physician or qualified health professional.    Review of Systems  Constitutional:  Positive for activity change.  Musculoskeletal:  Positive for arthralgias, back pain and gait problem.  All other systems reviewed and are negative. For Review of Systems, all other systems reviewed and are negative.  The following is a summary of the past history medically, past history surgically, known current medicines, social history and family history.  This information is gathered electronically by the computer from prior information and documentation.  I review this each visit and have found including this information at this point in the chart is beneficial and informative.   Past Medical History:  Diagnosis Date   Anxiety    Asthma    hosp. 2018 and 2019 no h/o intubation    Chronic bronchitis (HCC)    "get it pretty much q yr" (12/13/2015)   Depression    Depression    GERD (gastroesophageal reflux disease)    Insomnia    Palpitations    Perianal abscess 11/22/2016   12/07/2016.  [facility], [place].  Ovidio Kin, MD.  Perianal abscess is much better but still needs local wound care.  Continue sitz baths 2-3  times perday until all drainage and pain are gone.  Follow up as needed.   Pneumonia    "multiple times when I was a child; once as an adult so far" (12/13/2015)   Pre-syncope    PVC (premature ventricular contraction)    Seasonal allergies    Seizures (HCC)    thought 2/2 benzo w/d after overuse 03/29/21   Tachycardia     Past Surgical History:  Procedure Laterality Date   CESAREAN SECTION  10/15/2009, 02/08/2006   INCISION AND DRAINAGE PERIRECTAL ABSCESS Bilateral 11/22/2016   Procedure: IRRIGATION AND DEBRIDEMENT PERIRECTAL ABSCESS;  Surgeon: Ovidio Kin, MD;  Location: [facility];  Service: General;  Laterality: Bilateral;   INTRAUTERINE DEVICE INSERTION     Mirena 12/24/19 exp 12/28/21 Dr Luther Parody Tami Ribas ob/gyn    Current Outpatient Medications on File Prior to Visit  Medication Sig Dispense Refill   albuterol (VENTOLIN HFA) 108 (90 Base) MCG/ACT inhaler Inhale 1-2 puffs into the lungs every 6 (six) hours as needed for wheezing or shortness of breath. 1 each 0   ALPRAZolam (XANAX) 1 MG tablet Take 1 tablet (1 mg total) by mouth at bedtime as needed for anxiety. 7 tablet 0   cyclobenzaprine (FLEXERIL) 5 MG tablet Take 1 tablet (5 mg total) by mouth 3 (three) times daily as needed for muscle spasms. 15 tablet  0   FLUoxetine (PROZAC) 20 MG capsule Take 60 mg by mouth every morning.     FLUoxetine HCl 60 MG TABS Take 60 mg by mouth daily. 30 tablet 11   gabapentin (NEURONTIN) 300 MG capsule TAKE 2 CAPSULES (600 MG TOTAL) BY MOUTH 3 (THREE) TIMES DAILY. 540 capsule 1   levETIRAcetam (KEPPRA) 500 MG tablet Take 1 tablet (500 mg total) by mouth 2 (two) times daily. 30 tablet 0   levonorgestrel (MIRENA) 20 MCG/24HR IUD 1 Intra Uterine Device (1 each total) by Intrauterine route once. 1 each 0   telmisartan (MICARDIS) 20 MG tablet Take 1 tablet (20 mg total) by mouth daily. 30 tablet 0   rosuvastatin (CRESTOR) 10 MG tablet Take 1 tablet (10 mg total) by mouth daily. (Patient not  taking: Reported on 08/16/2021) 90 tablet 1   [DISCONTINUED] atenolol (TENORMIN) 25 MG tablet Take 25 mg by mouth daily.     [DISCONTINUED] diphenhydrAMINE (BENADRYL) 25 MG tablet Take by mouth daily as needed.       No current facility-administered medications on file prior to visit.    Social History   Socioeconomic History   Marital status: Married    Spouse name: Not on file   Number of children: 2   Years of education: Not on file   Highest education level: Not on file  Occupational History   Occupation: respiratory therapists    Employer: [employer]  Tobacco Use   Smoking status: Every Day    Packs/day: 0.50    Types: Cigarettes   Smokeless tobacco: Never  Vaping Use   Vaping Use: Never used  Substance and Sexual Activity   Alcohol use: No   Drug use: No   Sexual activity: Not on file  Other Topics Concern   Not on file  Social History Narrative   Respiratory Therapist, 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    College ed    2 kids    Divorced but remarried since 05/2019 new last name will be Chestine Spore   Wears seat belt, feels safe in relationship    Social Determinants of Corporate investment banker Strain: Not on file  Food Insecurity: Not on file  Transportation Needs: Not on file  Physical Activity: Not on file  Stress: Not on file  Social Connections: Not on file  Intimate Partner Violence: Not on file    Family History  Problem Relation Age of Onset   Heart disease Father    Rheumatologic disease Mother    Arthritis Mother    Asthma Mother    Depression Mother    Lung cancer Paternal Grandmother    Asthma Maternal Grandmother    Hypertension Maternal Grandmother    Heart disease Maternal Grandfather     BP (!) 165/123   Pulse 95   Ht 5\' 4"  (1.626 m)   Wt 168 lb (76.2 kg)   BMI 28.84 kg/m   Body mass index is 28.84 kg/m.     Objective:   Physical Exam Vitals and nursing note reviewed. Exam conducted with a chaperone present.  Constitutional:       Appearance: She is well-developed.  HENT:     Head: Normocephalic and atraumatic.  Eyes:     Conjunctiva/sclera: Conjunctivae normal.     Pupils: Pupils are equal, round, and reactive to light.  Cardiovascular:     Rate and Rhythm: Normal rate and regular rhythm.  Pulmonary:     Effort: Pulmonary effort is normal.  Abdominal:  Palpations: Abdomen is soft.  Musculoskeletal:       Arms:     Cervical back: Normal range of motion and neck supple.       Legs:  Skin:    General: Skin is warm and dry.  Neurological:     Mental Status: She is alert and oriented to person, place, and time.     Cranial Nerves: No cranial nerve deficit.     Motor: No abnormal muscle tone.     Coordination: Coordination normal.     Deep Tendon Reflexes: Reflexes are normal and symmetric. Reflexes normal.  Psychiatric:        Behavior: Behavior normal.        Thought Content: Thought content normal.        Judgment: Judgment normal.          Assessment & Plan:   Encounter Diagnoses  Name Primary?   Lumbar pain with radiation down left leg Yes   Chronic pain of both hips    Trochanteric bursitis, left hip    PROCEDURE NOTE:  The patient request injection, verbal consent was obtained.  The left trochanteric area of the hip was prepped appropriately after time out was performed.   Sterile technique was observed and injection of 1 cc of Celestone 6 mg with several cc's of plain xylocaine. Anesthesia was provided by ethyl chloride and a 20-gauge needle was used to inject the hip area. The injection was tolerated well.  A band aid dressing was applied.  The patient was advised to apply ice later today and tomorrow to the injection sight as needed.   Stop the ibuprofen.  Get MRI of the lumbar spine.  I am concerned about HNP.  Begin Naprosyn 500 po bid pc.  Return in three weeks.  Call if any problem.  Precautions discussed.  Electronically Signed Darreld Mclean, MD 9/20/20229:00  AM

## 2021-08-21 ENCOUNTER — Other Ambulatory Visit: Payer: Self-pay | Admitting: Internal Medicine

## 2021-08-21 DIAGNOSIS — I10 Essential (primary) hypertension: Secondary | ICD-10-CM

## 2021-08-25 ENCOUNTER — Ambulatory Visit: Payer: Commercial Managed Care - PPO | Admitting: Nurse Practitioner

## 2021-08-30 ENCOUNTER — Ambulatory Visit (HOSPITAL_COMMUNITY): Payer: Commercial Managed Care - PPO

## 2021-08-30 ENCOUNTER — Encounter (HOSPITAL_COMMUNITY): Payer: Self-pay

## 2021-08-30 ENCOUNTER — Ambulatory Visit: Payer: Commercial Managed Care - PPO | Admitting: Internal Medicine

## 2021-08-31 ENCOUNTER — Telehealth: Payer: Self-pay | Admitting: Orthopaedic Surgery

## 2021-08-31 NOTE — Telephone Encounter (Signed)
Patient asked for message to be sent to [facility] for Dr Hilda Lias for her to have a prescription for something to relax her prior to MRI, scheduled for 09/20/21. Pharmacy is [address], [place]

## 2021-09-01 ENCOUNTER — Telehealth: Payer: Self-pay

## 2021-09-01 MED ORDER — DIAZEPAM 10 MG PO TABS: ORAL_TABLET | ORAL | 0 refills | Status: DC

## 2021-09-01 NOTE — Telephone Encounter (Signed)
FMLA   Noted Copied  Sleeved  

## 2021-09-04 ENCOUNTER — Other Ambulatory Visit: Payer: Self-pay | Admitting: Internal Medicine

## 2021-09-04 DIAGNOSIS — I10 Essential (primary) hypertension: Secondary | ICD-10-CM

## 2021-09-06 ENCOUNTER — Ambulatory Visit: Payer: Commercial Managed Care - PPO | Admitting: Orthopaedic Surgery

## 2021-09-06 NOTE — Progress Notes (Signed)
Office Visit Note  Patient: Kelsey [name]             Date of Birth: 28-Jan-1981           MRN: [id number]             PCP: Anabel Halon, MD Referring: Anabel Halon, MD Visit Date: 09/07/2021 Occupation: RT  Subjective:   History of Present Illness: Kelsey [name] is a 40 y.o. female here for joint pain of multiple sites with positive dsDNA and family history of arthritis.  She has chronic ongoing pain in the bilateral shoulders and hips by far most severe in the low back and hips ongoing since around January of this year.  She does not recall any associated medical changes events or injuries around the time of symptom onset.  This was initially not too bad but has become dramatically worse since she resumed working around August of this year.  Symptoms increase and has frequently been unable to tolerate complete 12-hour shifts due to pain limiting her mobility and function.  Joint pain sometimes radiates down the leg but most commonly extends from the back down to the knees bilaterally.  Left side is somewhat worse than right.  No numbness or paresthesias or weakness.  Pain is constant throughout the day.  She has noticed some splotchy erythematous skin rashes on her extremities but no residual changes and did not appear consistently or specific provoking factor. She wakes at night sometimes attributes to discomfort but wakes feeling unrested regardless of sleep duration.  She feels some difficulty with concentration and short-term memory and attention.  She denies any specific gastrointestinal symptoms changes.  She denies alopecia, oral ulcers, lymphadenopathy, Raynaud's symptoms, or history of blood clots. She has been prescribed Naprosyn without a large benefit in symptoms on this medicine.  She takes Xanax chronically interruption of this earlier this year was thought to contributed to absence seizure for which she started Keppra.  She has taken Flexeril in the past but not currently  prescribed this medication.  She has been scheduled for a lumbar spine MRI on October 25.   Labs reviewed 06/2021 ANA pos dsDNA 11 RNP, SM, Scl-70, SSA, SSB, chromatin, Jo-1, centromere Abs neg RF neg CCP neg CRP 5 HCV neg TSH 3.66  Activities of Daily Living:  Patient reports morning stiffness for all day. Patient Reports nocturnal pain.  Difficulty dressing/grooming: Reports Difficulty climbing stairs: Reports Difficulty getting out of chair: Reports Difficulty using hands for taps, buttons, cutlery, and/or writing: Denies  Review of Systems  Constitutional:  Positive for fatigue.  HENT:  Negative for mouth sores, mouth dryness and nose dryness.        Nose sore  Eyes:  Positive for itching. Negative for pain and dryness.  Respiratory:  Positive for shortness of breath. Negative for difficulty breathing.   Cardiovascular:  Negative for chest pain and palpitations.  Gastrointestinal:  Negative for blood in stool, constipation and diarrhea.  Endocrine: Negative for increased urination.  Genitourinary:  Negative for difficulty urinating.  Musculoskeletal:  Positive for joint pain, joint pain, joint swelling, myalgias, morning stiffness, muscle tenderness and myalgias.  Skin:  Negative for color change, rash and redness.  Allergic/Immunologic: Positive for susceptible to infections.  Neurological:  Positive for headaches, memory loss and weakness. Negative for dizziness and numbness.  Hematological:  Negative for bruising/bleeding tendency.  Psychiatric/Behavioral:  Negative for confusion.    PMFS History:  Patient Active Problem List   Diagnosis  Date Noted   Positive double stranded DNA antibody test 09/07/2021   Mixed hyperlipidemia 08/16/2021   Seizure disorder (HCC) 07/28/2021   Chronic hip pain 07/28/2021   Essential hypertension 07/28/2021   Tobacco abuse 07/28/2021   Polyarthritis 07/28/2021   Anal skin tag 10/21/2019   Internal hemorrhoids 09/26/2017   Asthma  10/15/2015   Allergic rhinitis 10/15/2015   Alcohol abuse 07/14/2015   GAD (generalized anxiety disorder) 07/14/2015   Depression, major, recurrent, moderate (HCC) 07/14/2015   GERD (gastroesophageal reflux disease) 08/22/2013    Past Medical History:  Diagnosis Date   Anxiety    Asthma    hosp. 2018 and 2019 no h/o intubation    Chronic bronchitis (HCC)    "get it pretty much q yr" (12/13/2015)   Depression    Depression    GERD (gastroesophageal reflux disease)    Insomnia    Palpitations    Perianal abscess 11/22/2016   12/07/2016.  [facility], [place].  Ovidio Kin, MD.  Perianal abscess is much better but still needs local wound care.  Continue sitz baths 2-3 times perday until all drainage and pain are gone.  Follow up as needed.   Pneumonia    "multiple times when I was a child; once as an adult so far" (12/13/2015)   Pre-syncope    PVC (premature ventricular contraction)    Seasonal allergies    Seizures (HCC)    thought 2/2 benzo w/d after overuse 03/29/21   Tachycardia     Family History  Problem Relation Age of Onset   Rheumatologic disease Mother    Arthritis Mother    Asthma Mother    Depression Mother    Heart disease Father    Asthma Maternal Grandmother    Hypertension Maternal Grandmother    Heart disease Maternal Grandfather    Lung cancer Paternal Grandmother    Healthy Daughter    Healthy Son    Past Surgical History:  Procedure Laterality Date   CESAREAN SECTION  10/15/2009, 02/08/2006   INCISION AND DRAINAGE PERIRECTAL ABSCESS Bilateral 11/22/2016   Procedure: IRRIGATION AND DEBRIDEMENT PERIRECTAL ABSCESS;  Surgeon: Ovidio Kin, MD;  Location: [facility];  Service: General;  Laterality: Bilateral;   INTRAUTERINE DEVICE INSERTION     Mirena 12/24/19 exp 12/28/21 Dr Luther [facility]   Social History   Social History Narrative   Respiratory Therapist, 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    College ed    2 kids    Divorced but  remarried since 05/2019 new last name will be Chestine Spore   Wears seat belt, feels safe in relationship    Immunization History  Administered Date(s) Administered   Influenza Whole 09/27/2012   Influenza,inj,Quad PF,6+ Mos 08/21/2013, 08/30/2015   Influenza-Unspecified 07/28/2013, 08/18/2014, 07/28/2016, 08/27/2016, 08/30/2017   Moderna Sars-Covid-2 Vaccination 11/29/2020   PFIZER(Purple Top)SARS-COV-2 Vaccination 12/27/2020   Pneumococcal Conjugate-13 04/27/2013   Pneumococcal Polysaccharide-23 02/20/2018     Objective: Vital Signs: BP 121/89 (BP Location: Right Arm, Patient Position: Sitting, Cuff Size: Normal)   Pulse (!) 111   Ht 5' 4.5" (1.638 m)   Wt 166 lb 12.8 oz (75.7 kg)   BMI 28.19 kg/m    Physical Exam HENT:     Mouth/Throat:     Mouth: Mucous membranes are moist.     Pharynx: Oropharynx is clear.  Eyes:     Conjunctiva/sclera: Conjunctivae normal.  Cardiovascular:     Rate and Rhythm: Normal rate and regular rhythm.  Pulmonary:  Effort: Pulmonary effort is normal.     Breath sounds: Normal breath sounds.  Musculoskeletal:     Right lower leg: No edema.     Left lower leg: No edema.  Skin:    General: Skin is warm and dry.     Findings: No rash.  Neurological:     Mental Status: She is alert.     Deep Tendon Reflexes: Reflexes normal.  Psychiatric:        Mood and Affect: Mood normal.     Comments: Briefly tearful discussing impact of symptoms on work and family life     Musculoskeletal Exam:  Neck full ROM no tenderness Shoulders full ROM no tenderness or swelling Elbows full ROM no tenderness or swelling Wrists full ROM no tenderness or swelling Fingers full ROM no tenderness or swelling Paraspinal tenderness to palpation at lower thoracic spine and throughout lumbar spine down to SI joints. Anterior and lateral pain provoked with internal and external hip rotation, severely reduced FADIR and FABIR movement with tight guarding, mild lateral hip  tenderness to palpation Knees ROM reduced with guarding at flexion and extension end point, no palpable effusions Ankles full ROM no tenderness or swelling    Investigation: No additional findings.  Imaging: No results found.  Recent Labs: Lab Results  Component Value Date   WBC 6.1 07/28/2021   HGB 15.5 07/28/2021   PLT 362 07/28/2021   NA 137 07/28/2021   K 4.6 07/28/2021   CL 97 07/28/2021   CO2 23 07/28/2021   GLUCOSE 81 07/28/2021   BUN 14 07/28/2021   CREATININE 0.68 07/28/2021   BILITOT 0.3 07/28/2021   ALKPHOS 130 (H) 07/28/2021   AST 26 07/28/2021   ALT 25 07/28/2021   PROT 7.8 07/28/2021   ALBUMIN 4.6 07/28/2021   CALCIUM 10.0 07/28/2021   GFRAA >60 01/06/2018    Speciality Comments: No specialty comments available.  Procedures:  No procedures performed Allergies: Penicillins   Assessment / Plan:     Visit Diagnoses: Positive double stranded DNA antibody test  Polyarthritis - Plan: Anti-DNA antibody, double-stranded, C3 and C4, Sedimentation rate  No specific clinical criteria on exam or history at this time.  Double-stranded DNA is positive but at fairly low titer will be checked this also serum complements and sedimentation rate today.  I do not see any particular inflammation to explain her symptom severity.  The pain is also unusual for lupus arthritis activity, no evidence on her previous imaging for sacroiliitis or other spondyloarthropathy changes.  I have a lower pretest suspicion for disease, however if labs are increasingly positive would follow-up as a possible unusual early presentation. She has a very severe amount of spasticity and guarding against any passive range of motion on exam this is likely worsening her symptoms.  Already has had some chronic benzodiazepine use might benefit from use of a non benzodiazepine muscle relaxant medication at night for the hip pain spasticity.   Orders: Orders Placed This Encounter  Procedures   Anti-DNA  antibody, double-stranded   C3 and C4   Sedimentation rate    No orders of the defined types were placed in this encounter.   Follow-Up Instructions: No follow-ups on file.   Fuller Plan, MD  Note - This record has been created using [employer].  Chart creation errors have been sought, but may not always  have been located. Such creation errors do not reflect on  the standard of medical care.

## 2021-09-07 ENCOUNTER — Encounter: Payer: Self-pay | Admitting: Internal Medicine

## 2021-09-07 ENCOUNTER — Ambulatory Visit (INDEPENDENT_AMBULATORY_CARE_PROVIDER_SITE_OTHER): Payer: Commercial Managed Care - PPO | Admitting: Internal Medicine

## 2021-09-07 ENCOUNTER — Telehealth: Payer: Self-pay

## 2021-09-07 ENCOUNTER — Other Ambulatory Visit: Payer: Self-pay

## 2021-09-07 VITALS — BP 121/89 | HR 111 | Ht 64.5 in | Wt 166.8 lb

## 2021-09-07 DIAGNOSIS — M130 Polyarthritis, unspecified: Secondary | ICD-10-CM | POA: Diagnosis not present

## 2021-09-07 DIAGNOSIS — I10 Essential (primary) hypertension: Secondary | ICD-10-CM

## 2021-09-07 DIAGNOSIS — R768 Other specified abnormal immunological findings in serum: Secondary | ICD-10-CM | POA: Insufficient documentation

## 2021-09-07 DIAGNOSIS — R7689 Other specified abnormal immunological findings in serum: Secondary | ICD-10-CM | POA: Insufficient documentation

## 2021-09-07 MED ORDER — CYCLOBENZAPRINE HCL 5 MG PO TABS: 5.0000 mg | ORAL_TABLET | Freq: Two times a day (BID) | ORAL | 1 refills | Status: DC | PRN

## 2021-09-07 NOTE — Assessment & Plan Note (Signed)
BP Readings from Last 1 Encounters:  09/07/21 121/89   Well-controlled with Telmisartan-HCTZ 40-12.5 mg QD now Counseled for compliance with the medications Advised DASH diet and moderate exercise/walking, at least 150 mins/week

## 2021-09-07 NOTE — Assessment & Plan Note (Signed)
B/l shoulder, hip and lumbar spine pain Mother has RA Positive anti-dsDNA antibody Referred to Rheumatology for further assessment Flexeril PRN for muscle spasms

## 2021-09-07 NOTE — Progress Notes (Signed)
Virtual Visit via Telephone Note   This visit type was conducted due to national recommendations for restrictions regarding the COVID-19 Pandemic (e.g. social distancing) in an effort to limit this patient's exposure and mitigate transmission in our community.  Due to her co-morbid illnesses, this patient is at least at moderate risk for complications without adequate follow up.  This format is felt to be most appropriate for this patient at this time.  The patient did not have access to video technology/had technical difficulties with video requiring transitioning to audio format only (telephone).  All issues noted in this document were discussed and addressed.  No physical exam could be performed with this format.  Evaluation Performed:  Follow-up visit  Date:  09/07/2021   ID:  Kelsey [name], DOB 1981/06/04, MRN [id number]  Patient Location: Home Provider Location: Office/Clinic  Participants: Patient Location of Patient: Home Location of Provider: Telehealth Consent was obtain for visit to be over via telehealth. I verified that I am speaking with the correct person using two identifiers.  PCP:  Anabel Halon, MD   Chief Complaint: Joint pain and FMLA paperwork  History of Present Illness:    Kelsey [name] is a 40 y.o. female who has a televisit for c/o multiple joint pain. Of note, she had anti-dsDNA antibody positive, for which she was referred to Rheumatology. She had visit with Rheumatologist today. She has tried Flexeril for muscle spasms in the past and asks whether she can take it.  BP is well-controlled now. Takes medications regularly. Patient denies headache, dizziness, chest pain, dyspnea or palpitations.  She has had to take some leaves due to her joint pain and seizures in last few months. She has requested FMLA paperwork, which has been filled today.   The patient does not have symptoms concerning for COVID-19 infection (fever, chills, cough, or new  shortness of breath).   Past Medical, Surgical, Social History, Allergies, and Medications have been Reviewed.  Past Medical History:  Diagnosis Date   Anxiety    Asthma    hosp. 2018 and 2019 no h/o intubation    Chronic bronchitis (HCC)    "get it pretty much q yr" (12/13/2015)   Depression    Depression    GERD (gastroesophageal reflux disease)    Insomnia    Palpitations    Perianal abscess 11/22/2016   12/07/2016.  [facility], [place].  Ovidio Kin, MD.  Perianal abscess is much better but still needs local wound care.  Continue sitz baths 2-3 times perday until all drainage and pain are gone.  Follow up as needed.   Pneumonia    "multiple times when I was a child; once as an adult so far" (12/13/2015)   Pre-syncope    PVC (premature ventricular contraction)    Seasonal allergies    Seizures (HCC)    thought 2/2 benzo w/d after overuse 03/29/21   Tachycardia    Past Surgical History:  Procedure Laterality Date   CESAREAN SECTION  10/15/2009, 02/08/2006   INCISION AND DRAINAGE PERIRECTAL ABSCESS Bilateral 11/22/2016   Procedure: IRRIGATION AND DEBRIDEMENT PERIRECTAL ABSCESS;  Surgeon: Ovidio Kin, MD;  Location: [facility];  Service: General;  Laterality: Bilateral;   INTRAUTERINE DEVICE INSERTION     Mirena 12/24/19 exp 12/28/21 Dr Philip Asp[facility]     Current Meds  Medication Sig   albuterol (VENTOLIN HFA) 108 (90 Base) MCG/ACT inhaler Inhale 1-2 puffs into the lungs every 6 (six) hours  as needed for wheezing or shortness of breath.   ALPRAZolam (XANAX) 1 MG tablet Take 1 tablet (1 mg total) by mouth at bedtime as needed for anxiety.   diazepam (VALIUM) 10 MG tablet Take one tablet about an hour before MRI. Do not drive care.   FLUoxetine (PROZAC) 20 MG capsule Take 20 mg by mouth every morning.   FLUoxetine HCl 60 MG TABS Take 60 mg by mouth daily.   gabapentin (NEURONTIN) 300 MG capsule TAKE 2 CAPSULES (600 MG TOTAL) BY MOUTH 3 (THREE) TIMES  DAILY.   levETIRAcetam (KEPPRA) 500 MG tablet Take 1 tablet (500 mg total) by mouth 2 (two) times daily.   levonorgestrel (MIRENA) 20 MCG/24HR IUD 1 Intra Uterine Device (1 each total) by Intrauterine route once.   naproxen (NAPROSYN) 500 MG tablet Take 1 tablet (500 mg total) by mouth 2 (two) times daily with a meal.   rosuvastatin (CRESTOR) 10 MG tablet Take 1 tablet (10 mg total) by mouth daily.   telmisartan-hydrochlorothiazide (MICARDIS HCT) 40-12.5 MG tablet TAKE 1 TABLET BY MOUTH EVERY DAY   [DISCONTINUED] cyclobenzaprine (FLEXERIL) 5 MG tablet Take 1 tablet (5 mg total) by mouth 3 (three) times daily as needed for muscle spasms.     Allergies:   Penicillins   ROS:   Please see the history of present illness.     All other systems reviewed and are negative.   Labs/Other Tests and Data Reviewed:    Recent Labs: 07/28/2021: ALT 25; BUN 14; Creatinine, Ser 0.68; Hemoglobin 15.5; Platelets 362; Potassium 4.6; Sodium 137; TSH 3.660   Recent Lipid Panel Lab Results  Component Value Date/Time   CHOL 231 (H) 07/28/2021 03:10 PM   TRIG 595 (HH) 07/28/2021 03:10 PM   HDL 50 07/28/2021 03:10 PM   CHOLHDL 4.6 (H) 07/28/2021 03:10 PM   CHOLHDL 3 01/30/2020 10:51 AM   LDLCALC 85 07/28/2021 03:10 PM   LDLCALC 66 04/15/2018 01:00 PM    Wt Readings from Last 3 Encounters:  09/07/21 166 lb 12.8 oz (75.7 kg)  08/16/21 168 lb 1.9 oz (76.3 kg)  08/16/21 168 lb (76.2 kg)     ASSESSMENT & PLAN:    Polyarthritis B/l shoulder, hip and lumbar spine pain Mother has RA Positive anti-dsDNA antibody Referred to Rheumatology for further assessment Flexeril PRN for muscle spasms  Essential hypertension BP Readings from Last 1 Encounters:  09/07/21 121/89   Well-controlled with Telmisartan-HCTZ 40-12.5 mg QD now Counseled for compliance with the medications Advised DASH diet and moderate exercise/walking, at least 150 mins/week   Time:   Today, I have spent 16 minutes reviewing the  chart, including problem list, medications, and with the patient with telehealth technology discussing the above problems.   Medication Adjustments/Labs and Tests Ordered: Current medicines are reviewed at length with the patient today.  Concerns regarding medicines are outlined above.   Tests Ordered: No orders of the defined types were placed in this encounter.   Medication Changes: Meds ordered this encounter  Medications   cyclobenzaprine (FLEXERIL) 5 MG tablet    Sig: Take 1 tablet (5 mg total) by mouth every 12 (twelve) hours as needed for muscle spasms.    Dispense:  45 tablet    Refill:  1     Note: This dictation was prepared with Dragon dictation along with smaller phrase technology. Similar sounding words can be transcribed inadequately or may not be corrected upon review. Any transcriptional errors that result from this process are unintentional.  Disposition:  Follow up  Signed, Anabel Halon, MD  09/07/2021 3:52 PM     [facility]

## 2021-09-07 NOTE — Telephone Encounter (Signed)
Patient will need an appt can be virtual with patel we can do today as work in if she is able

## 2021-09-07 NOTE — Telephone Encounter (Signed)
Patient called said her rheumatoid Dr Dimple Casey told her to contact per primary care doctor to prescribe a muscle relaxer Flexeril   Pharmacy: [facility] 

## 2021-09-07 NOTE — Telephone Encounter (Signed)
Scheduled virtual appt.   

## 2021-09-07 NOTE — Patient Instructions (Signed)
Anti-DNA Antibody Test Why am I having this test? The anti-DNA antibody test helps with the diagnosis and follow-up of systemic lupus erythematosus (SLE). It is also used to monitor treatment of this condition as the antibody decreases with successful therapy. What is being tested? This test measures the amount of anti-DNA antibody in the blood. This antibody is found in 65-80% of patients with active SLE. This antibody is not as common in patients who have other diseases. What kind of sample is taken? A blood sample is required for this test. It is usually collected by inserting a needle into a blood vessel. How are the results reported? Your test results will be reported as a value. Your test results may also be reported as positive, intermediate, or negative. Your health care provider will compare your results to normal ranges that were established after testing a large group of people (reference values). Reference values may vary among labs and hospitals. For this test, common reference values are: Positive: 10 or more international units/mL. Intermediate: 5-9 international units/mL. Negative: Less than 5 international units/mL. What do the results mean? Positive results, which are associated with results that are higher than the reference values, may indicate: Autoimmune disorders such as SLE. Infectious mononucleosis. Chronic liver conditions. Intermediate results mean that the anti-DNA antibody levels are higher than normal, but not high enough to be considered positive. Negative results mean that you do not have the anti-DNA antibody that is associated with these conditions. Talk with your health care provider about what your results mean. Questions to ask your health care provider Ask your health care provider, or the department that is doing the test: When will my results be ready? How will I get my results? What are my treatment options? What other tests do I need? What are my  next steps? Summary The anti-DNA antibody test helps with the diagnosis and follow-up of systemic lupus erythematosus (SLE). It is also used to monitor treatment of this condition as the antibody decreases with successful therapy. This test measures the amount of anti-DNA antibody in the blood. Elevated levels of anti-DNA antibody can be seen in patients with SLE and certain other conditions.  Complement Assay Test Why am I having this test? Complement refers to a group of proteins that are part of the body's disease-fighting system (immune system). A complement assay test provides information about some or all of these proteins. You may have this test: To diagnose a lack, or deficiency, of certain complement proteins. Deficiencies can be passed from parent to child (inherited). To monitor an infection or autoimmune disease. If you have unexplained inflammation or swelling (edema). If you have bacterial infections again and again. What is being tested? This test can be used to measure: Total complement. This is the total number of protein complements in your blood. The number of each kind of complement in your blood. The nine main kinds of complement are labeled C1 through C9. Some of these complements, such as C3 and C4, are especially important and have many functions in the body. Depending on why you are having the test, your health care provider may test your total complement or only some individual complements, such as C3 and C4. The total complement assay test may be done before individual complements are tested. What kind of sample is taken? A blood sample is required for this test. It is usually collected by inserting a needle into a blood vessel. Tell a health care provider about: Any allergies you have.  All medicines you are taking, including vitamins, herbs, eye drops, creams, and over-the-counter medicines. Any blood disorders you have. Any surgeries you have had. Any medical  conditions you have. Whether you are pregnant or may be pregnant. How are the results reported? Your results will be reported as a value that tells you how much complement is in your blood. This will be given as units per milliliter of blood (units/mL) or as milligrams per deciliter of blood (mg/dL). Your results may be reported as total complement, or as individual complements, or both. Your health care provider will compare your results to normal ranges that were established after testing a large group of people (reference ranges). Reference ranges may vary among labs and hospitals. For this test, reference ranges for some of the most commonly measured complement assays may be: Total complement: 30-75 units/mL. C2: 1-4 mg/dL. C3: 75-175 mg/dL. C4: 22-45 units/mL. What do the results mean? Results within reference ranges are considered normal, which means you have a normal amount of complement in your blood. Results that are higher than the reference ranges may be caused by: Inflammatory disease. Heart attack. Cancer. Complement deficiencies, or results lower than the reference ranges, may be caused by: Certain inherited conditions. Autoimmune disease. Certain liver diseases. Malnutrition. Certain types of anemia that result in breakdown of red blood cells (hemolytic anemia). Talk with your health care provider about what your results mean. Questions to ask your health care provider Ask your health care provider, or the department that is doing the test: When will my results be ready? How will I get my results? What are my treatment options? What other tests do I need? What are my next steps? Summary Complement refers to a group of proteins that are part of the body's disease-fighting system (immune system). A complement assay test can provide information about some or all of these proteins. You may have a complement assay test to help diagnose a complement deficiency, and to monitor  some infections or autoimmune disease. Talk with your health care provider about what your results mean.  Erythrocyte Sedimentation Rate Test Why am I having this test? The erythrocyte sedimentation rate (ESR) test is used to help find illnesses related to: Sudden (acute) or long-term (chronic) infections. Inflammation. The body's disease-fighting system attacking healthy cells (autoimmune diseases). Cancer. Tissue death. If you have symptoms that may be related to any of these illnesses, your health care provider may do an ESR test before doing more specific tests. If you have an inflammatory immune disease, such as rheumatoid arthritis, you may have this test to help monitor your therapy. What is being tested? This test measures how long it takes for your red blood cells (erythrocytes) to settle in a solution over a certain amount of time (sedimentation rate). When you have an infection or inflammation, your red blood cells clump together and settle faster. The sedimentation rate provides information about how much inflammation is present in the body. What kind of sample is taken? A blood sample is required for this test. It is usually collected by inserting a needle into a blood vessel. How do I prepare for this test? Follow any instructions from your health care provider about changing or stopping your regular medicines. Tell a health care provider about: Any allergies you have. All medicines you are taking, including vitamins, herbs, eye drops, creams, and over-the-counter medicines. Any blood disorders you have. Any surgeries you have had. Any medical conditions you have, such as thyroid or kidney disease.  Whether you are pregnant or may be pregnant. How are the results reported? Your results will be reported as a value that measures sedimentation rate in millimeters per hour (mm/hr). Your health care provider will compare your results to normal ranges that were established after  testing a large group of people (reference values). Reference values may vary among labs and hospitals. For this test, common reference values, which vary by age and gender, are: Newborn: 0-2 mm/hr. Child, up to puberty: 0-10 mm/hr. Female: Under 50 years: 0-20 mm/hr. 50-85 years: 0-30 mm/hr. Over 85 years: 0-42 mm/hr. Female: Under 50 years: 0-15 mm/hr. 50-85 years: 0-20 mm/hr. Over 85 years: 0-30 mm/hr. Certain conditions or medicines may cause ESR levels to be falsely lower or higher, such as: Pregnancy. Obesity. Steroids, birth control pills, and blood thinners. Thyroid or kidney disease. What do the results mean? Results that are within reference values are considered normal, meaning that the level of inflammation in your body is healthy. High ESR levels mean that there is inflammation in your body. You will have more tests to help make a diagnosis. Inflammation may result from many different conditions or injuries. Talk with your health care provider about what your results mean. Questions to ask your health care provider Ask your health care provider, or the department that is doing the test: When will my results be ready? How will I get my results? What are my treatment options? What other tests do I need? What are my next steps? Summary The erythrocyte sedimentation rate (ESR) test is used to help find illnesses associated with sudden (acute) or long-term (chronic) infections, inflammation, autoimmune diseases, cancer, or tissue death. If you have symptoms that may be related to any of these illnesses, your health care provider may do an ESR test before doing more specific tests. If you have an inflammatory immune disease, such as rheumatoid arthritis, you may have this test to help monitor your therapy. This test measures how long it takes for your red blood cells (erythrocytes) to settle in a solution over a certain amount of time (sedimentation rate). This provides information  about how much inflammation is present in the body. This information is not intended to replace advice given to you by your health care provider. Make sure you discuss any questions you have with your health care provider. Document Revised: 07/16/2020 Document Reviewed: 07/16/2020 Elsevier Patient Education  [place].

## 2021-09-08 LAB — SEDIMENTATION RATE: Sed Rate: 17 mm/h (ref 0–20)

## 2021-09-08 LAB — ANTI-DNA ANTIBODY, DOUBLE-STRANDED: ds DNA Ab: 10 IU/mL — ABNORMAL HIGH

## 2021-09-08 LAB — C3 AND C4
C3 Complement: 157 mg/dL (ref 83–193)
C4 Complement: 37 mg/dL (ref 15–57)

## 2021-09-08 NOTE — Telephone Encounter (Signed)
Called patient, she will come by to pick up forms.

## 2021-09-20 ENCOUNTER — Ambulatory Visit (HOSPITAL_COMMUNITY)
Admission: RE | Admit: 2021-09-20 | Discharge: 2021-09-20 | Disposition: A | Discharge status: Home / Self Care | Payer: Commercial Managed Care - PPO | Source: Ambulatory Visit | Attending: Orthopaedic Surgery | Admitting: Orthopaedic Surgery

## 2021-09-20 ENCOUNTER — Other Ambulatory Visit (HOSPITAL_COMMUNITY): Payer: Self-pay | Admitting: Family Medicine

## 2021-09-20 ENCOUNTER — Other Ambulatory Visit: Payer: Self-pay

## 2021-09-20 DIAGNOSIS — Z1231 Encounter for screening mammogram for malignant neoplasm of breast: Secondary | ICD-10-CM

## 2021-09-20 DIAGNOSIS — M79605 Pain in left leg: Secondary | ICD-10-CM | POA: Insufficient documentation

## 2021-09-20 DIAGNOSIS — M545 Low back pain, unspecified: Secondary | ICD-10-CM | POA: Insufficient documentation

## 2021-09-27 ENCOUNTER — Other Ambulatory Visit: Payer: Self-pay

## 2021-09-27 ENCOUNTER — Encounter: Payer: Self-pay | Admitting: Orthopaedic Surgery

## 2021-09-27 ENCOUNTER — Ambulatory Visit (INDEPENDENT_AMBULATORY_CARE_PROVIDER_SITE_OTHER): Payer: Commercial Managed Care - PPO | Admitting: Orthopaedic Surgery

## 2021-09-27 DIAGNOSIS — M5116 Intervertebral disc disorders with radiculopathy, lumbar region: Secondary | ICD-10-CM | POA: Diagnosis not present

## 2021-09-27 DIAGNOSIS — M4716 Other spondylosis with myelopathy, lumbar region: Secondary | ICD-10-CM | POA: Diagnosis not present

## 2021-09-27 DIAGNOSIS — M79605 Pain in left leg: Secondary | ICD-10-CM

## 2021-09-27 DIAGNOSIS — M545 Low back pain, unspecified: Secondary | ICD-10-CM

## 2021-09-27 NOTE — Progress Notes (Signed)
My back still hurts.  She had MRI of the lumbar spine showing: IMPRESSION: 1. Mild multilevel lumbar spondylosis including a left paracentral disc protrusion at L4-5 resulting in moderate left and mild right subarticular recess stenosis. 2. No canal or foraminal stenosis at any level.  I have explained the findings to her.  I will have neurosurgeon see her and see if epidural is indicated or not.  She agrees.  Spine/Pelvis examination:  Inspection:  Overall, sacoiliac joint benign and hips nontender; without crepitus or defects.   Thoracic spine inspection: Alignment normal without kyphosis present   Lumbar spine inspection:  Alignment  with normal lumbar lordosis, without scoliosis apparent.   Thoracic spine palpation:  without tenderness of spinal processes   Lumbar spine palpation: without tenderness of lumbar area; without tightness of lumbar muscles    Range of Motion:   Lumbar flexion, forward flexion is normal without pain or tenderness    Lumbar extension is full without pain or tenderness   Left lateral bend is normal without pain or tenderness   Right lateral bend is normal without pain or tenderness   Straight leg raising is normal  Strength & tone: normal   Stability overall normal stability  I have independently reviewed the MRI.     Encounter Diagnosis  Name Primary?   Lumbar pain with radiation down left leg Yes   I will see her in six weeks.  See neurosurgery.  Call if any problem.  Precautions discussed.  Electronically Signed Darreld Mclean, MD 11/1/20229:14 AM

## 2021-09-29 ENCOUNTER — Encounter: Payer: Self-pay | Admitting: Internal Medicine

## 2021-10-05 ENCOUNTER — Other Ambulatory Visit: Payer: Self-pay

## 2021-10-05 ENCOUNTER — Encounter: Payer: Self-pay | Admitting: Orthopaedic Surgery

## 2021-10-05 ENCOUNTER — Ambulatory Visit (INDEPENDENT_AMBULATORY_CARE_PROVIDER_SITE_OTHER): Payer: Commercial Managed Care - PPO | Admitting: Internal Medicine

## 2021-10-05 ENCOUNTER — Encounter: Payer: Self-pay | Admitting: Internal Medicine

## 2021-10-05 DIAGNOSIS — M47816 Spondylosis without myelopathy or radiculopathy, lumbar region: Secondary | ICD-10-CM

## 2021-10-05 MED ORDER — TRAMADOL HCL 50 MG PO TABS
50.0000 mg | ORAL_TABLET | Freq: Three times a day (TID) | ORAL | 0 refills | Status: AC | PRN
Start: 2021-10-05 — End: 2021-10-10

## 2021-10-05 NOTE — Progress Notes (Signed)
Virtual Visit via Telephone Note   This visit type was conducted due to national recommendations for restrictions regarding the COVID-19 Pandemic (e.g. social distancing) in an effort to limit this patient's exposure and mitigate transmission in our community.  Due to her co-morbid illnesses, this patient is at least at moderate risk for complications without adequate follow up.  This format is felt to be most appropriate for this patient at this time.  The patient did not have access to video technology/had technical difficulties with video requiring transitioning to audio format only (telephone).  All issues noted in this document were discussed and addressed.  No physical exam could be performed with this format.  Evaluation Performed:  Follow-up visit  Date:  10/05/2021   ID:  Kelsey [name], DOB 1981-08-08, MRN [id number]  Patient Location: Home Provider Location: Office/Clinic  Participants: Patient Location of Patient: Home Location of Provider: Telehealth Consent was obtain for visit to be over via telehealth. I verified that I am speaking with the correct person using two identifiers.  PCP:  Anabel Halon, MD   Chief Complaint: Back pain  History of Present Illness:    Kelsey [name] is a 40 y.o. female who has a televisit for complaint of chronic low back pain and hip pain, and has been having difficulty standing due to it.  She has seen orthopedic surgeon and had MRI of lumbar spine recently, which showed lumbar spondylosis and disc protrusion at L4/5 levels.  She has intermittent numbness of the LE.  She has missed work due to her low back pain.  Denies any recent injury or heavy lifting.  The patient does not have symptoms concerning for COVID-19 infection (fever, chills, cough, or new shortness of breath).   Past Medical, Surgical, Social History, Allergies, and Medications have been Reviewed.  Past Medical History:  Diagnosis Date   Anxiety    Asthma     hosp. 2018 and 2019 no h/o intubation    Chronic bronchitis (HCC)    "get it pretty much q yr" (12/13/2015)   Depression    Depression    GERD (gastroesophageal reflux disease)    Insomnia    Palpitations    Perianal abscess 11/22/2016   12/07/2016.  [facility], [place].  Ovidio Kin, MD.  Perianal abscess is much better but still needs local wound care.  Continue sitz baths 2-3 times perday until all drainage and pain are gone.  Follow up as needed.   Pneumonia    "multiple times when I was a child; once as an adult so far" (12/13/2015)   Pre-syncope    PVC (premature ventricular contraction)    Seasonal allergies    Seizures (HCC)    thought 2/2 benzo w/d after overuse 03/29/21   Tachycardia    Past Surgical History:  Procedure Laterality Date   CESAREAN SECTION  10/15/2009, 02/08/2006   INCISION AND DRAINAGE PERIRECTAL ABSCESS Bilateral 11/22/2016   Procedure: IRRIGATION AND DEBRIDEMENT PERIRECTAL ABSCESS;  Surgeon: Ovidio Kin, MD;  Location: [facility];  Service: General;  Laterality: Bilateral;   INTRAUTERINE DEVICE INSERTION     Mirena 12/24/19 exp 12/28/21 Dr Philip Aspen [facility]     Current Meds  Medication Sig   albuterol (VENTOLIN HFA) 108 (90 Base) MCG/ACT inhaler Inhale 1-2 puffs into the lungs every 6 (six) hours as needed for wheezing or shortness of breath.   ALPRAZolam (XANAX) 1 MG tablet Take 1 tablet (1 mg total) by mouth  at bedtime as needed for anxiety.   atenolol (TENORMIN) 25 MG tablet Take 25 mg by mouth daily.   cyclobenzaprine (FLEXERIL) 5 MG tablet Take 1 tablet (5 mg total) by mouth every 12 (twelve) hours as needed for muscle spasms.   FLUoxetine HCl 60 MG TABS Take 60 mg by mouth daily.   gabapentin (NEURONTIN) 300 MG capsule TAKE 2 CAPSULES (600 MG TOTAL) BY MOUTH 3 (THREE) TIMES DAILY.   levETIRAcetam (KEPPRA) 500 MG tablet Take 1 tablet (500 mg total) by mouth 2 (two) times daily.   levonorgestrel (MIRENA) 20 MCG/24HR IUD 1 Intra  Uterine Device (1 each total) by Intrauterine route once.   naproxen (NAPROSYN) 500 MG tablet Take 1 tablet (500 mg total) by mouth 2 (two) times daily with a meal.   rosuvastatin (CRESTOR) 10 MG tablet Take 1 tablet (10 mg total) by mouth daily.   telmisartan-hydrochlorothiazide (MICARDIS HCT) 40-12.5 MG tablet TAKE 1 TABLET BY MOUTH EVERY DAY     Allergies:   Penicillins   ROS:   Please see the history of present illness.     All other systems reviewed and are negative.   Labs/Other Tests and Data Reviewed:    Recent Labs: 07/28/2021: ALT 25; BUN 14; Creatinine, Ser 0.68; Hemoglobin 15.5; Platelets 362; Potassium 4.6; Sodium 137; TSH 3.660   Recent Lipid Panel Lab Results  Component Value Date/Time   CHOL 231 (H) 07/28/2021 03:10 PM   TRIG 595 (HH) 07/28/2021 03:10 PM   HDL 50 07/28/2021 03:10 PM   CHOLHDL 4.6 (H) 07/28/2021 03:10 PM   CHOLHDL 3 01/30/2020 10:51 AM   LDLCALC 85 07/28/2021 03:10 PM   LDLCALC 66 04/15/2018 01:00 PM    Wt Readings from Last 3 Encounters:  09/07/21 166 lb 12.8 oz (75.7 kg)  08/16/21 168 lb 1.9 oz (76.3 kg)  08/16/21 168 lb (76.2 kg)     ASSESSMENT & PLAN:    Lumbar spondylosis Recent MRI lumbar spine showed lumbar spondylosis Going to see neurosurgeon in the next week Flexeril as needed Naproxen as needed for mild-to-moderate pain Prescribed tramadol as needed for severe pain Avoid heavy lifting Heating pad as needed   Time:   Today, I have spent 13 minutes reviewing the chart, including problem list, medications, and with the patient with telehealth technology discussing the above problems.   Medication Adjustments/Labs and Tests Ordered: Current medicines are reviewed at length with the patient today.  Concerns regarding medicines are outlined above.   Tests Ordered: No orders of the defined types were placed in this encounter.   Medication Changes: No orders of the defined types were placed in this encounter.    Note:  This dictation was prepared with Dragon dictation along with smaller phrase technology. Similar sounding words can be transcribed inadequately or may not be corrected upon review. Any transcriptional errors that result from this process are unintentional.      Disposition:  Follow up  Signed, Anabel Halon, MD  10/05/2021 10:41 AM     [facility]

## 2021-10-05 NOTE — Telephone Encounter (Signed)
Pt made a telephone visit 10-05-21

## 2021-10-05 NOTE — Assessment & Plan Note (Addendum)
Recent MRI lumbar spine showed lumbar spondylosis Going to see neurosurgeon in the next week Flexeril as needed Naproxen as needed for mild-to-moderate pain Prescribed tramadol as needed for severe pain Avoid heavy lifting Heating pad as needed

## 2021-10-06 ENCOUNTER — Other Ambulatory Visit: Payer: Self-pay

## 2021-10-06 ENCOUNTER — Encounter: Payer: Self-pay | Admitting: Orthopaedic Surgery

## 2021-10-06 ENCOUNTER — Ambulatory Visit (INDEPENDENT_AMBULATORY_CARE_PROVIDER_SITE_OTHER): Payer: Commercial Managed Care - PPO | Admitting: Orthopaedic Surgery

## 2021-10-06 DIAGNOSIS — M7062 Trochanteric bursitis, left hip: Secondary | ICD-10-CM | POA: Diagnosis not present

## 2021-10-06 DIAGNOSIS — M7061 Trochanteric bursitis, right hip: Secondary | ICD-10-CM | POA: Diagnosis not present

## 2021-10-06 NOTE — Progress Notes (Signed)
PROCEDURE NOTE:  The patient request injection, verbal consent was obtained.  The right trochanteric area of the hip was prepped appropriately after time out was performed.   Sterile technique was observed and injection of 1 cc of DepoMedrol 40mg  with several cc's of plain xylocaine. Anesthesia was provided by ethyl chloride and a 20-gauge needle was used to inject the hip area. The injection was tolerated well.  A band aid dressing was applied.  The patient was advised to apply ice later today and tomorrow to the injection sight as needed.  PROCEDURE NOTE:  The patient request injection, verbal consent was obtained.  The left trochanteric area of the hip was prepped appropriately after time out was performed.   Sterile technique was observed and injection of 1 cc of DepoMedrol 40mg  with several cc's of plain xylocaine. Anesthesia was provided by ethyl chloride and a 20-gauge needle was used to inject the hip area. The injection was tolerated well.  A band aid dressing was applied.  The patient was advised to apply ice later today and tomorrow to the injection sight as needed.  Encounter Diagnoses  Name Primary?   Trochanteric bursitis, left hip Yes   Trochanteric bursitis, right hip    See prn.  Call if any problem.  Precautions discussed.  Electronically Signed , MD 11/10/20229:24 AM

## 2021-10-21 ENCOUNTER — Other Ambulatory Visit: Payer: Self-pay | Admitting: Internal Medicine

## 2021-10-21 DIAGNOSIS — M130 Polyarthritis, unspecified: Secondary | ICD-10-CM

## 2021-11-02 ENCOUNTER — Telehealth: Payer: Self-pay | Admitting: Internal Medicine

## 2021-11-02 ENCOUNTER — Telehealth: Payer: Self-pay | Admitting: Orthopaedic Surgery

## 2021-11-02 NOTE — Telephone Encounter (Signed)
Patient wants appt for injection in her hips.  Tried to offer her appt for tomorrow and she said no she can't tomorrow she wanted it today.  I advised her Dr. Hilda Lias is in the office on Tuesday all day and Thursday morning only.  She said she guess she will wait till next Tuesday and talk with him then about getting an injection

## 2021-11-02 NOTE — Telephone Encounter (Signed)
Would need an appointment to discuss with provider

## 2021-11-02 NOTE — Telephone Encounter (Signed)
PT called in about leg pain    Pt wanted to see if she could get a muscle relaxer sent in to help with pain until she sees Dr. Jenetta Downer office

## 2021-11-02 NOTE — Telephone Encounter (Signed)
Let patient know she would need an appt, pt decided to wait for [facility] appt

## 2021-11-08 ENCOUNTER — Other Ambulatory Visit (HOSPITAL_COMMUNITY): Payer: Self-pay | Admitting: Neurosurgery

## 2021-11-08 ENCOUNTER — Encounter: Payer: Self-pay | Admitting: Orthopaedic Surgery

## 2021-11-08 ENCOUNTER — Other Ambulatory Visit: Payer: Self-pay | Admitting: Neurosurgery

## 2021-11-08 ENCOUNTER — Ambulatory Visit (INDEPENDENT_AMBULATORY_CARE_PROVIDER_SITE_OTHER): Payer: Commercial Managed Care - PPO | Admitting: Orthopaedic Surgery

## 2021-11-08 ENCOUNTER — Other Ambulatory Visit: Payer: Self-pay

## 2021-11-08 VITALS — BP 175/118 | HR 102 | Ht 64.5 in | Wt 162.4 lb

## 2021-11-08 DIAGNOSIS — M7062 Trochanteric bursitis, left hip: Secondary | ICD-10-CM

## 2021-11-08 DIAGNOSIS — M7061 Trochanteric bursitis, right hip: Secondary | ICD-10-CM | POA: Diagnosis not present

## 2021-11-08 NOTE — Progress Notes (Signed)
She saw the neurosurgeon.  He wants to get MRIs of the hips and she is awaiting insurance approval.  PROCEDURE NOTE:  The patient request injection, verbal consent was obtained.  The right trochanteric area of the hip was prepped appropriately after time out was performed.   Sterile technique was observed and injection of 1 cc of DepoMedrol 40 mg with several cc's of plain xylocaine. Anesthesia was provided by ethyl chloride and a 20-gauge needle was used to inject the hip area. The injection was tolerated well.  A band aid dressing was applied.  The patient was advised to apply ice later today and tomorrow to the injection sight as needed.  PROCEDURE NOTE:  The patient request injection, verbal consent was obtained.  The left trochanteric area of the hip was prepped appropriately after time out was performed.   Sterile technique was observed and injection of 1 cc of DepoMedrol 40mg  with several cc's of plain xylocaine. Anesthesia was provided by ethyl chloride and a 20-gauge needle was used to inject the hip area. The injection was tolerated well.  A band aid dressing was applied.  The patient was advised to apply ice later today and tomorrow to the injection sight as needed.  Encounter Diagnoses  Name Primary?   Trochanteric bursitis, left hip Yes   Trochanteric bursitis, right hip    Return in one month.  Get the MRI.  Call if any problem.  Precautions discussed.  Electronically Signed , MD 12/13/20228:16 AM

## 2021-11-11 ENCOUNTER — Other Ambulatory Visit: Payer: Self-pay

## 2021-11-11 ENCOUNTER — Encounter: Payer: Self-pay | Admitting: Internal Medicine

## 2021-11-11 ENCOUNTER — Ambulatory Visit (INDEPENDENT_AMBULATORY_CARE_PROVIDER_SITE_OTHER): Payer: Commercial Managed Care - PPO | Admitting: Internal Medicine

## 2021-11-11 VITALS — BP 144/84 | HR 88 | Resp 18 | Ht 64.0 in | Wt 162.1 lb

## 2021-11-11 DIAGNOSIS — M47816 Spondylosis without myelopathy or radiculopathy, lumbar region: Secondary | ICD-10-CM

## 2021-11-11 DIAGNOSIS — M130 Polyarthritis, unspecified: Secondary | ICD-10-CM | POA: Diagnosis not present

## 2021-11-11 DIAGNOSIS — E782 Mixed hyperlipidemia: Secondary | ICD-10-CM

## 2021-11-11 DIAGNOSIS — F411 Generalized anxiety disorder: Secondary | ICD-10-CM

## 2021-11-11 DIAGNOSIS — I10 Essential (primary) hypertension: Secondary | ICD-10-CM

## 2021-11-11 MED ORDER — PREDNISONE 10 MG (21) PO TBPK: ORAL_TABLET | ORAL | 0 refills | Status: DC

## 2021-11-11 MED ORDER — CYCLOBENZAPRINE HCL 10 MG PO TABS: 10.0000 mg | ORAL_TABLET | Freq: Two times a day (BID) | ORAL | 2 refills | Status: DC | PRN

## 2021-11-11 NOTE — Progress Notes (Signed)
Established Patient Office Visit  Subjective:  Patient ID: Kelsey [name], female    DOB: 1980/12/05  Age: 40 y.o. MRN: [id number]  CC:  Chief Complaint  Patient presents with   Follow-up    Follow up pt hip pain is worse has to leave work at least once a week also bp has been running high     HPI Kelsey [name] is a 40 y.o. female with past medical history of  HTN, asthma, GERD, seizure disorder, depression with anxiety, chronic back and hip pain and tobacco abuse who presents for f/u of her chronic medical conditions.  Her blood pressure was elevated in the office today.  It had improved in the last visit with telmisartan HCTZ.  Of note, she is having severe hip pain for the last few days.  She recently had steroid injection in her hip for trochanteric bursitis with Dr. Luna Glasgow.  She has episodes of headache, which intermittent, dull and generalized headache.  Denies any chest pain or palpitations.  She continues to have severe hip pain b/l, for which she had a steroid injection 3 days ago, but has not helped her yet.  She had to leave from work early yesterday and has not been able to go to work today.  She was seen by neurosurgeon for lumbar spondylosis, but they have recommended MRI of the hip.  She denies any recent injury or fall.  She has been taking Flexeril for muscle cramps.  She had Rheumatology eval for possible autoimmune arthritis, but it was thought to be mainly OA.  She has been taking Crestor for HLD.  Past Medical History:  Diagnosis Date   Anxiety    Asthma    hosp. 2018 and 2019 no h/o intubation    Chronic bronchitis ([place])    "get it pretty much q yr" (12/13/2015)   Depression    Depression    GERD (gastroesophageal reflux disease)    Insomnia    Palpitations    Perianal abscess 11/22/2016   12/07/2016.  [facility], [place].  [name], MD.  Perianal abscess is much better but still needs local wound care.  Continue sitz baths 2-3 times perday  until all drainage and pain are gone.  Follow up as needed.   Pneumonia    "multiple times when I was a child; once as an adult so far" (12/13/2015)   Pre-syncope    PVC (premature ventricular contraction)    Seasonal allergies    Seizures ([place])    thought 2/2 benzo w/d after overuse 03/29/21   Tachycardia     Past Surgical History:  Procedure Laterality Date   CESAREAN SECTION  10/15/2009, 02/08/2006   INCISION AND DRAINAGE PERIRECTAL ABSCESS Bilateral 11/22/2016   Procedure: IRRIGATION AND DEBRIDEMENT PERIRECTAL ABSCESS;  Surgeon: [name], MD;  Location: [facility];  Service: General;  Laterality: Bilateral;   INTRAUTERINE DEVICE INSERTION     Mirena 12/24/19 exp 12/28/21 Dr Casimer Bilis [name] ob/gyn    Family History  Problem Relation Age of Onset   Rheumatologic disease Mother    Arthritis Mother    Asthma Mother    Depression Mother    Heart disease Father    Asthma Maternal Grandmother    Hypertension Maternal Grandmother    Heart disease Maternal Grandfather    Lung cancer Paternal Grandmother    Healthy Daughter    Healthy Son     Social History   Socioeconomic History   Marital status: Married  Spouse name: Not on file   Number of children: 2   Years of education: Not on file   Highest education level: Not on file  Occupational History   Occupation: respiratory therapists    Employer: [employer]  Tobacco Use   Smoking status: Every Day    Packs/day: 0.50    Types: Cigarettes   Smokeless tobacco: Never  Vaping Use   Vaping Use: Never used  Substance and Sexual Activity   Alcohol use: No   Drug use: No   Sexual activity: Not on file  Other Topics Concern   Not on file  Social History Narrative   Respiratory Therapist, 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[employer]    2 kids    Divorced but remarried since 05/2019 new last name will be Carlis Abbott   Wears seat belt, feels safe in relationship    Social Determinants of Health   Financial Resource  Strain: Not on file  Food Insecurity: Not on file  Transportation Needs: Not on file  Physical Activity: Not on file  Stress: Not on file  Social Connections: Not on file  Intimate Partner Violence: Not on file    Outpatient Medications Prior to Visit  Medication Sig Dispense Refill   albuterol (VENTOLIN HFA) 108 (90 Base) MCG/ACT inhaler Inhale 1-2 puffs into the lungs every 6 (six) hours as needed for wheezing or shortness of breath. 1 each 11   ALPRAZolam (XANAX) 1 MG tablet Take 1 tablet (1 mg total) by mouth at bedtime as needed for anxiety. 7 tablet 0   FLUoxetine HCl 60 MG TABS Take 60 mg by mouth daily. 30 tablet 11   gabapentin (NEURONTIN) 300 MG capsule TAKE 2 CAPSULES (600 MG TOTAL) BY MOUTH 3 (THREE) TIMES DAILY. 540 capsule 1   levETIRAcetam (KEPPRA) 500 MG tablet Take 1 tablet (500 mg total) by mouth 2 (two) times daily. 30 tablet 0   levonorgestrel (MIRENA) 20 MCG/24HR IUD 1 Intra Uterine Device (1 each total) by Intrauterine route once. 1 each 0   naproxen (NAPROSYN) 500 MG tablet Take 1 tablet (500 mg total) by mouth 2 (two) times daily with a meal. 60 tablet 5   rosuvastatin (CRESTOR) 10 MG tablet Take 1 tablet (10 mg total) by mouth daily. 90 tablet 1   telmisartan-hydrochlorothiazide (MICARDIS HCT) 40-12.5 MG tablet TAKE 1 TABLET BY MOUTH EVERY DAY 90 tablet 1   atenolol (TENORMIN) 25 MG tablet Take 25 mg by mouth daily.     cyclobenzaprine (FLEXERIL) 5 MG tablet TAKE 1 TABLET (5 MG TOTAL) BY MOUTH EVERY 12 (TWELVE) HOURS AS NEEDED FOR MUSCLE SPASMS. 45 tablet 1   No facility-administered medications prior to visit.    Allergies  Allergen Reactions   Penicillins Rash    Has patient had a PCN reaction causing immediate rash, facial/tongue/throat swelling, SOB or lightheadedness with hypotension: No Has patient had a PCN reaction causing severe rash involving mucus membranes or skin necrosis: No Has patient had a PCN reaction that required hospitalization No Has  patient had a PCN reaction occurring within the last 10 years: No If all of the above answers are "NO", then may proceed with Cephalosporin use.    ROS Review of Systems  Constitutional:  Positive for fatigue. Negative for chills and fever.  HENT:  Negative for congestion, sinus pressure, sinus pain and sore throat.   Eyes:  Negative for pain and discharge.  Respiratory:  Negative for cough, shortness of breath and wheezing.  Cardiovascular:  Negative for chest pain and palpitations.  Gastrointestinal:  Negative for abdominal pain, constipation, diarrhea, nausea and vomiting.  Endocrine: Negative for polydipsia and polyuria.  Genitourinary:  Negative for dysuria and hematuria.  Musculoskeletal:  Positive for arthralgias and back pain. Negative for neck pain and neck stiffness.  Skin:  Negative for rash.  Neurological:  Positive for seizures. Negative for dizziness and weakness.  Psychiatric/Behavioral:  Positive for sleep disturbance. Negative for agitation and behavioral problems. The patient is nervous/anxious.      Objective:    Physical Exam Vitals reviewed.  Constitutional:      General: She is not in acute distress.    Appearance: She is not diaphoretic.  HENT:     Head: Normocephalic and atraumatic.     Nose: Nose normal.     Mouth/Throat:     Mouth: Mucous membranes are moist.  Eyes:     General: No scleral icterus.    Extraocular Movements: Extraocular movements intact.  Cardiovascular:     Rate and Rhythm: Normal rate and regular rhythm.     Pulses: Normal pulses.     Heart sounds: Normal heart sounds. No murmur heard. Pulmonary:     Breath sounds: Normal breath sounds. No wheezing or rales.  Musculoskeletal:        General: Tenderness (B/l hip) present.     Cervical back: Neck supple. No tenderness.     Right lower leg: No edema.     Left lower leg: No edema.  Skin:    General: Skin is warm.     Findings: No rash.  Neurological:     General: No focal  deficit present.     Mental Status: She is alert and oriented to person, place, and time.     Sensory: No sensory deficit.     Motor: No weakness.  Psychiatric:        Mood and Affect: Mood normal.        Behavior: Behavior normal.    BP (!) 144/84 (BP Location: Right Arm)    Pulse 88    Resp 18    Ht 5' 4"  (1.626 m)    Wt 162 lb 1.9 oz (73.5 kg)    SpO2 96%    BMI 27.83 kg/m  Wt Readings from Last 3 Encounters:  11/11/21 162 lb 1.9 oz (73.5 kg)  11/08/21 162 lb 6.4 oz (73.7 kg)  09/07/21 166 lb 12.8 oz (75.7 kg)    Lab Results  Component Value Date   TSH 3.660 07/28/2021   Lab Results  Component Value Date   WBC 6.1 07/28/2021   HGB 15.5 07/28/2021   HCT 46.5 07/28/2021   MCV 99 (H) 07/28/2021   PLT 362 07/28/2021   Lab Results  Component Value Date   NA 137 07/28/2021   K 4.6 07/28/2021   CO2 23 07/28/2021   GLUCOSE 81 07/28/2021   BUN 14 07/28/2021   CREATININE 0.68 07/28/2021   BILITOT 0.3 07/28/2021   ALKPHOS 130 (H) 07/28/2021   AST 26 07/28/2021   ALT 25 07/28/2021   PROT 7.8 07/28/2021   ALBUMIN 4.6 07/28/2021   CALCIUM 10.0 07/28/2021   ANIONGAP 9 07/14/2021   EGFR 113 07/28/2021   GFR 97.90 01/30/2020   Lab Results  Component Value Date   CHOL 231 (H) 07/28/2021   Lab Results  Component Value Date   HDL 50 07/28/2021   Lab Results  Component Value Date   LDLCALC 85 07/28/2021  Lab Results  Component Value Date   TRIG 595 (HH) 07/28/2021   Lab Results  Component Value Date   CHOLHDL 4.6 (H) 07/28/2021   No results found for: HGBA1C    Assessment & Plan:   Problem List Items Addressed This Visit       Cardiovascular and Mediastinum   Essential hypertension - Primary    BP Readings from Last 1 Encounters:  11/11/21 (!) 144/84  Elevated today due to severe hip pain Was well-controlled with Telmisartan-HCTZ 40-12.5 mg QD Counseled for compliance with the medications Advised DASH diet and moderate exercise/walking, at least 150  mins/week      Relevant Orders   CMP14+EGFR     Musculoskeletal and Integument   Polyarthritis    B/l shoulder, hip and lumbar spine pain Mother has RA Positive anti-dsDNA antibody Has had Rheumatology eval - do not think she has autoimmune arthritis Followed by Orthopedic surgery for hip pain - has had steroid injection Has seen Neurosurgery - recommended MRI of hip Flexeril PRN for muscle spasms, increased dose to 10 mg BID PRN      Relevant Medications   cyclobenzaprine (FLEXERIL) 10 MG tablet   predniSONE (STERAPRED UNI-PAK 21 TAB) 10 MG (21) TBPK tablet   Lumbar spondylosis    Had spine surgery eval Recommended that her pain is mostly related to hip, planned to get MRI of the hip      Relevant Medications   cyclobenzaprine (FLEXERIL) 10 MG tablet   predniSONE (STERAPRED UNI-PAK 21 TAB) 10 MG (21) TBPK tablet     Other   GAD (generalized anxiety disorder)    Followed by Psychiatry - Dr Toy Care On Xanax PRN and Prozac      Mixed hyperlipidemia   Relevant Orders   Lipid Profile    Meds ordered this encounter  Medications   cyclobenzaprine (FLEXERIL) 10 MG tablet    Sig: Take 1 tablet (10 mg total) by mouth every 12 (twelve) hours as needed for muscle spasms.    Dispense:  60 tablet    Refill:  2   predniSONE (STERAPRED UNI-PAK 21 TAB) 10 MG (21) TBPK tablet    Sig: Take as package instructions.    Dispense:  1 each    Refill:  0    Follow-up: Return in about 4 months (around 03/12/2022).    Lindell Spar, MD

## 2021-11-11 NOTE — Assessment & Plan Note (Signed)
BP Readings from Last 1 Encounters:  11/11/21 (!) 144/84   Elevated today due to severe hip pain Was well-controlled with Telmisartan-HCTZ 40-12.5 mg QD Counseled for compliance with the medications Advised DASH diet and moderate exercise/walking, at least 150 mins/week

## 2021-11-11 NOTE — Assessment & Plan Note (Signed)
Had spine surgery eval Recommended that her pain is mostly related to hip, planned to get MRI of the hip

## 2021-11-11 NOTE — Patient Instructions (Signed)
Please start taking Flexeril 10 mg instead of 5 mg.  Please take Prednisone as prescribed.  Please check your BP at home and contact us if it remains more than 150/90.

## 2021-11-11 NOTE — Assessment & Plan Note (Signed)
B/l shoulder, hip and lumbar spine pain Mother has RA Positive anti-dsDNA antibody Has had Rheumatology eval - do not think she has autoimmune arthritis Followed by Orthopedic surgery for hip pain - has had steroid injection Has seen Neurosurgery - recommended MRI of hip Flexeril PRN for muscle spasms, increased dose to 10 mg BID PRN

## 2021-11-11 NOTE — Assessment & Plan Note (Signed)
Followed by Psychiatry - Dr Evelene Croon On Xanax PRN and Prozac

## 2021-11-14 ENCOUNTER — Encounter: Payer: Self-pay | Admitting: Internal Medicine

## 2021-11-14 ENCOUNTER — Other Ambulatory Visit (HOSPITAL_COMMUNITY): Payer: Self-pay | Admitting: Neurosurgery

## 2021-11-14 ENCOUNTER — Other Ambulatory Visit: Payer: Self-pay | Admitting: Neurosurgery

## 2021-11-14 DIAGNOSIS — M25551 Pain in right hip: Secondary | ICD-10-CM

## 2021-11-14 DIAGNOSIS — M25552 Pain in left hip: Secondary | ICD-10-CM

## 2021-11-15 ENCOUNTER — Other Ambulatory Visit: Payer: Self-pay | Admitting: Internal Medicine

## 2021-11-15 ENCOUNTER — Ambulatory Visit: Payer: Commercial Managed Care - PPO | Admitting: Internal Medicine

## 2021-11-15 DIAGNOSIS — F40240 Claustrophobia: Secondary | ICD-10-CM

## 2021-11-15 MED ORDER — DIAZEPAM 10 MG PO TABS: ORAL_TABLET | ORAL | 0 refills | Status: DC

## 2021-11-16 ENCOUNTER — Ambulatory Visit: Payer: Commercial Managed Care - PPO | Admitting: Internal Medicine

## 2021-11-18 ENCOUNTER — Other Ambulatory Visit: Payer: Self-pay

## 2021-11-18 ENCOUNTER — Ambulatory Visit (HOSPITAL_COMMUNITY)
Admission: RE | Admit: 2021-11-18 | Discharge: 2021-11-18 | Disposition: A | Discharge status: Home / Self Care | Payer: Commercial Managed Care - PPO | Source: Ambulatory Visit | Attending: Neurosurgery | Admitting: Neurosurgery

## 2021-11-18 ENCOUNTER — Other Ambulatory Visit (HOSPITAL_COMMUNITY): Payer: Commercial Managed Care - PPO

## 2021-11-18 DIAGNOSIS — M25552 Pain in left hip: Secondary | ICD-10-CM | POA: Insufficient documentation

## 2021-11-29 ENCOUNTER — Telehealth: Payer: Self-pay

## 2021-11-29 NOTE — Telephone Encounter (Signed)
Patient left message that she wanted to schedule an appointment. I called her and reminded her that she has already got an appointment scheduled. She stated that her hip was really hurting her and I offered to move her appointment up to 12/01/21, but she stated that she would just wait until her appointment next week. I explained that if she was hurting that bad I would be glad to move her appointment up as soon as possible.

## 2021-12-02 ENCOUNTER — Other Ambulatory Visit: Payer: Self-pay | Admitting: Internal Medicine

## 2021-12-02 DIAGNOSIS — I10 Essential (primary) hypertension: Secondary | ICD-10-CM

## 2021-12-06 ENCOUNTER — Encounter: Payer: Self-pay | Admitting: Orthopaedic Surgery

## 2021-12-06 ENCOUNTER — Other Ambulatory Visit: Payer: Self-pay

## 2021-12-06 ENCOUNTER — Ambulatory Visit (INDEPENDENT_AMBULATORY_CARE_PROVIDER_SITE_OTHER): Payer: Commercial Managed Care - PPO | Admitting: Orthopaedic Surgery

## 2021-12-06 VITALS — BP 159/116 | HR 100 | Ht 64.0 in | Wt 162.0 lb

## 2021-12-06 DIAGNOSIS — M87051 Idiopathic aseptic necrosis of right femur: Secondary | ICD-10-CM | POA: Diagnosis not present

## 2021-12-06 DIAGNOSIS — M87052 Idiopathic aseptic necrosis of left femur: Secondary | ICD-10-CM | POA: Diagnosis not present

## 2021-12-06 NOTE — Patient Instructions (Signed)
Driving Directions to [employer] from [employer] address is [address] [place] The phone number is [phone]  Dr Doneen Poisson is the doctor you will see.   1. Start out going [place] on [address] toward [address].  Then 0.02 miles0.02 total miles 2. Take the 1st right onto [address]. Continue to follow [address].  If you reach [facility] you've gone a little too far  Then 0.58 miles0.60 total miles 3. Turn right onto [address].  [place] is just past [address]  Then 2.25 miles2.85 total miles 4. Take the [address] ramp toward [address].  Then 0.25 miles3.10 total miles 5. Merge onto [address]  Then 18.17 miles21.28 total miles 6. Merge onto [address]  Then 1.47 miles22.74 total miles 7. Turn right onto [place].  [address] is just past [address]  Then 0.11 miles22.85 total miles  8. [address], [place], [place] [address], [phone] is on the left.

## 2021-12-06 NOTE — Progress Notes (Signed)
I hurt all the time.  She was seen by Dr. Franky Macho and he got MRI of the left hip showing avascular necrosis of both hips.  This was done in late December.  I have reviewed the MRI.  I have independently reviewed the MRI.    She has pain of both hips and limited motion.  I will have her see Dr. Magnus Ivan at [facility].  I have explained the findings to her and her husband and need for total hips.  Encounter Diagnoses  Name Primary?   Avascular necrosis of bone of left hip (HCC) Yes   Avascular necrosis of bone of right hip (HCC)    To [employer].  Call if any problem.  Precautions discussed.  Electronically Signed Darreld Mclean, MD 1/10/20238:38 AM

## 2021-12-13 ENCOUNTER — Encounter (HOSPITAL_COMMUNITY): Payer: Self-pay

## 2021-12-13 ENCOUNTER — Emergency Department (HOSPITAL_COMMUNITY)
Admission: EM | Admit: 2021-12-13 | Discharge: 2021-12-13 | Disposition: A | Discharge status: Home / Self Care | Payer: Commercial Managed Care - PPO | Attending: Emergency Medicine | Admitting: Emergency Medicine

## 2021-12-13 ENCOUNTER — Other Ambulatory Visit: Payer: Self-pay

## 2021-12-13 DIAGNOSIS — M25552 Pain in left hip: Secondary | ICD-10-CM | POA: Diagnosis present

## 2021-12-13 DIAGNOSIS — T380X5A Adverse effect of glucocorticoids and synthetic analogues, initial encounter: Secondary | ICD-10-CM

## 2021-12-13 DIAGNOSIS — Z79899 Other long term (current) drug therapy: Secondary | ICD-10-CM | POA: Diagnosis not present

## 2021-12-13 DIAGNOSIS — M8710 Osteonecrosis due to drugs, unspecified bone: Secondary | ICD-10-CM

## 2021-12-13 DIAGNOSIS — M25551 Pain in right hip: Secondary | ICD-10-CM | POA: Insufficient documentation

## 2021-12-13 DIAGNOSIS — M87051 Idiopathic aseptic necrosis of right femur: Secondary | ICD-10-CM | POA: Insufficient documentation

## 2021-12-13 DIAGNOSIS — M25559 Pain in unspecified hip: Secondary | ICD-10-CM

## 2021-12-13 MED ORDER — HYDROMORPHONE HCL 1 MG/ML IJ SOLN
1.0000 mg | Freq: Once | INTRAMUSCULAR | Status: AC
  Administered 2021-12-13: 1 mg via INTRAMUSCULAR
  Filled 2021-12-13: qty 1

## 2021-12-13 MED ORDER — ONDANSETRON 4 MG PO TBDP
4.0000 mg | ORAL_TABLET | Freq: Once | ORAL | Status: AC
  Administered 2021-12-13: 4 mg via ORAL
  Filled 2021-12-13: qty 1

## 2021-12-13 MED ORDER — OXYCODONE-ACETAMINOPHEN 5-325 MG PO TABS: 1.0000 | ORAL_TABLET | Freq: Four times a day (QID) | ORAL | 0 refills | Status: DC | PRN

## 2021-12-13 NOTE — ED Provider Notes (Signed)
[facility] EMERGENCY DEPARTMENT Provider Note   CSN: [id number] Arrival date & time: 12/13/21  2863     History  Chief Complaint  Patient presents with   Hip Pain    Kelsey [name] is a 41 y.o. female with a longstanding history of asthma with prior frequent exposures to steroids who was recently diagnosed with bilateral avascular necrosis, currently awaiting orthopedic consult with Dr. Magnus Ivan next week.  In the interim she is having escalating pain in her bilateral hips and difficulty performing her home and work chores.  Patient works as a Buyer, retail.  She is currently taking anti-inflammatories, specifically naproxen, also has gabapentin but she states she is receiving little pain relief with these medications.  She does have numbness in her bilateral lower legs which has been present for a while.  She denies any new falls or injury which would explain escalating pain.  She is ambulating but with significant discomfort.  She denies urinary or fecal incontinence or retention.  She does have chronic low back pain under the care of of both Dr. Hilda Lias and Dr. Franky Macho.  In MRI performed October 2022 showed mild spondylosis and disc protrusion at L4-L5 without impingement.  She has found no alleviators for her pain symptoms.  The history is provided by the patient.      Home Medications Prior to Admission medications   Medication Sig Start Date End Date Taking? Authorizing Provider  oxyCODONE-acetaminophen (PERCOCET/ROXICET) 5-325 MG tablet Take 1 tablet by mouth every 6 (six) hours as needed for severe pain. 12/13/21  Yes Daney Moor, Raynelle Fanning, PA-C  albuterol (VENTOLIN HFA) 108 (90 Base) MCG/ACT inhaler Inhale 1-2 puffs into the lungs every 6 (six) hours as needed for wheezing or shortness of breath. 08/16/21   Anabel Halon, MD  ALPRAZolam Prudy Feeler) 1 MG tablet Take 1 tablet (1 mg total) by mouth at bedtime as needed for anxiety. 10/02/20   Devoria Albe, MD  cyclobenzaprine (FLEXERIL) 10  MG tablet Take 1 tablet (10 mg total) by mouth every 12 (twelve) hours as needed for muscle spasms. 11/11/21   Anabel Halon, MD  diazepam (VALIUM) 10 MG tablet Take 1 tablet 30 minutes before the procedure. 11/15/21   Anabel Halon, MD  FLUoxetine HCl 60 MG TABS Take 60 mg by mouth daily. 02/20/18   McLean-Scocuzza, Pasty Spillers, MD  gabapentin (NEURONTIN) 300 MG capsule TAKE 2 CAPSULES (600 MG TOTAL) BY MOUTH 3 (THREE) TIMES DAILY. 05/03/18   McLean-Scocuzza, Pasty Spillers, MD  levETIRAcetam (KEPPRA) 500 MG tablet Take 1 tablet (500 mg total) by mouth 2 (two) times daily. 07/14/21   Pollyann Savoy, MD  levonorgestrel (MIRENA) 20 MCG/24HR IUD 1 Intra Uterine Device (1 each total) by Intrauterine route once. 07/16/15   Adonis Brook, NP  naproxen (NAPROSYN) 500 MG tablet Take 1 tablet (500 mg total) by mouth 2 (two) times daily with a meal. 08/16/21   Darreld Mclean, MD  rosuvastatin (CRESTOR) 10 MG tablet Take 1 tablet (10 mg total) by mouth daily. 08/16/21   Anabel Halon, MD  telmisartan-hydrochlorothiazide (MICARDIS HCT) 40-12.5 MG tablet TAKE 1 TABLET BY MOUTH EVERY DAY 09/05/21   Anabel Halon, MD  diphenhydrAMINE (BENADRYL) 25 MG tablet Take by mouth daily as needed.    02/10/12  [provider]      Allergies    Penicillins    Review of Systems   Review of Systems  Constitutional:  Negative for fever.  Musculoskeletal:  Positive for arthralgias and  back pain. Negative for joint swelling and myalgias.  Neurological:  Negative for weakness and numbness.  All other systems reviewed and are negative.  Physical Exam Updated Vital Signs BP (!) 154/100    Pulse (!) 118    Temp 98 F (36.7 C) (Oral)    Resp 20    Ht 5\' 4"  (1.626 m)    Wt 73.5 kg    SpO2 95%    BMI 27.81 kg/m  Physical Exam Vitals and nursing note reviewed.  Constitutional:      Appearance: She is well-developed.  HENT:     Head: Normocephalic and atraumatic.  Eyes:     Conjunctiva/sclera: Conjunctivae normal.   Cardiovascular:     Rate and Rhythm: Normal rate.     Pulses: Normal pulses.     Comments: Pulses equal bilaterally Pulmonary:     Effort: Pulmonary effort is normal.  Abdominal:     General: Bowel sounds are normal. There is no distension.     Palpations: Abdomen is soft. There is no mass.  Musculoskeletal:        General: Tenderness present. No deformity. Normal range of motion.     Cervical back: Normal range of motion and neck supple.     Lumbar back: Tenderness present. No swelling, edema or spasms.     Comments: Pain with both active and passive movement or range of motion of either hip joint.  Skin:    General: Skin is warm and dry.  Neurological:     Mental Status: She is alert.     Sensory: No sensory deficit.     Motor: No weakness, tremor or atrophy.     Gait: Gait normal.     Deep Tendon Reflexes: Reflexes normal.     Comments: No strength deficit noted in hip and knee flexor and extensor muscle groups.  Ankle flexion and extension intact.  Note decreased strength with ankle or knee flexion extension.  Psychiatric:     Comments: Patient is tearful during this visit.  She expresses frustration over her limited mobility currently.  She denies homicidal or suicidal ideation.    ED Results / Procedures / Treatments   Labs (all labs ordered are listed, but only abnormal results are displayed) Labs Reviewed - No data to display  EKG None  Radiology No results found.  Procedures Procedures    Medications Ordered in ED Medications  HYDROmorphone (DILAUDID) injection 1 mg (1 mg Intramuscular Given 12/13/21 1139)  ondansetron (ZOFRAN-ODT) disintegrating tablet 4 mg (4 mg Oral Given 12/13/21 1139)    ED Course/ Medical Decision Making/ A&P                           Medical Decision Making Risk Prescription drug management.   This patient presents to the ED for chief complaint of chronic bilateral hip pain, this involves an extensive number of treatment  options. The differential diagnosis includes her known bilateral avascular necrosis, other possibilities include bilateral septic joint, hip fracture.  However she is afebrile, has no risk factors for septic joint.  No recent falls or injury that would suggest hip fracture.  Co morbidities that complicate the patient evaluation  Asthma with frequent steroid exposure  Additional history obtained:  Additional history obtained from patient's chart, recent imaging was reviewed including MRI and CT imaging. Lab Tests:  No labs indicated during this visit Imaging Studies ordered:  No new imaging indicated. Medicines ordered and prescription drug  management:  I ordered medication including Dilaudid IM and Zofran p.o. for pain control Reevaluation of the patient after these medicines showed that the patient improved I have reviewed the patients home medicines and have made adjustments as needed  Additional tests Considered:  Considered repeat imaging of hips, however patient deferred further imaging after discussion.  Critical Interventions:  N/a   Reevaluation:  After the interventions noted above, I reevaluated the patient and found that they have :improved  Social Determinants of Health:  N/a  Disposition:  After consideration of the diagnostic results and the patients response to treatment, I feel that the most appropriate treatment course includes discharge to home with improved pain medication and close follow-up with Dr. Magnus Ivan which is scheduled for 1 week from today.          Final Clinical Impression(s) / ED Diagnoses Final diagnoses:  Hip pain  Avascular necrosis due to adverse effect of steroid therapy [facility])    Rx / DC Orders ED Discharge Orders          Ordered    oxyCODONE-acetaminophen (PERCOCET/ROXICET) 5-325 MG tablet  Every 6 hours PRN        12/13/21 1108              Burgess Amor, Cordelia Poche 12/13/21 1219    Mancel Bale, MD 12/13/21  [phone]

## 2021-12-13 NOTE — Discharge Instructions (Addendum)
Take the pain medication prescribed to assist you with your discomfort until you are able to see Dr. Magnus Ivan.  Use caution with oxycodone as this medication will make you drowsy, do not drive within 4 hours of taking this medication.  I also recommend taking this medication at least 2 hours before or after your Xanax as these medications can have an additive drowsy effect.

## 2021-12-13 NOTE — ED Triage Notes (Signed)
Pt reports diagnosed with bilateral avascular necrosis of hips approx 1 month ago.  Has appt with surgeon Tuesday but says pain is getting worse.

## 2021-12-20 ENCOUNTER — Other Ambulatory Visit: Payer: Self-pay

## 2021-12-20 ENCOUNTER — Telehealth: Payer: Self-pay | Admitting: Orthopaedic Surgery

## 2021-12-20 ENCOUNTER — Ambulatory Visit (INDEPENDENT_AMBULATORY_CARE_PROVIDER_SITE_OTHER): Payer: Commercial Managed Care - PPO | Admitting: Orthopaedic Surgery

## 2021-12-20 ENCOUNTER — Encounter: Payer: Self-pay | Admitting: Orthopaedic Surgery

## 2021-12-20 VITALS — Ht 64.0 in | Wt 157.4 lb

## 2021-12-20 DIAGNOSIS — M87051 Idiopathic aseptic necrosis of right femur: Secondary | ICD-10-CM

## 2021-12-20 DIAGNOSIS — M87052 Idiopathic aseptic necrosis of left femur: Secondary | ICD-10-CM | POA: Diagnosis not present

## 2021-12-20 NOTE — Telephone Encounter (Signed)
Please advise of patients current work status. Forms have been received.

## 2021-12-20 NOTE — Progress Notes (Signed)
Office Visit Note   Patient: Kelsey [name]           Date of Birth: [birth year]           MRN: [id number] Visit Date: 12/20/2021              Requested by: Darreld Mclean, MD [address] [place],  [place] [address] PCP: Anabel Halon, MD   Assessment & Plan: Visit Diagnoses:  1. Avascular necrosis of bone of left hip (HCC)   2. Avascular necrosis of bone of right hip (HCC)     Plan: The patient does have severe avascular porosis of both hips with the left worse than right.  I am recommending a hip replacement.  I talked her in length in detail about a left hip replacement and described the interoperative and postoperative course.  I went over the MRI report and showed her hip replacement model.  We talked about the risks and benefits of surgery and what to expect from intraoperative and postoperative course.  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  I would want to then recommend a right hip replacement soon in the recovery period after her left hip given the severity of her AVN of both hips.  All questions and concerns were answered addressed.  We will work on getting scheduled.  Follow-Up Instructions: Return for 2 weeks post-op.   Orders:  No orders of the defined types were placed in this encounter.  No orders of the defined types were placed in this encounter.     Procedures: No procedures performed   Clinical Data: No additional findings.   Subjective: Chief Complaint  Patient presents with   Right Hip - Pain   Left Hip - Pain  The patient is sometimes seen for the first time but she has been referred to me due to severe avascular necrosis of both her hips.  Her left hip hurts much more significantly than the right hip but both have been getting rapidly worse over less than a year.  She does have plain films in the canopy system and MRIs of her hips that accompany her.  She is only 27 BMI.  She  is not a diabetic.  She does work as a Buyer, retail.  Her pain is daily and severe.  She is been taking naproxen and Flexeril and has had some oxycodone but none of these things are helping her do the severity of her left and right hip pain and groin pain.  Her avascular porosis is idiopathic but she has had a history of trauma in the past and has had steroids prescribed in the past.  She is nonalcoholic.  HPI  Review of Systems There is currently no headache, chest pain, shortness of breath, fever, chills, nausea, vomiting  Objective: Vital Signs: Ht 5\' 4"  (1.626 m)    Wt 157 lb 6.4 oz (71.4 kg)    BMI 27.02 kg/m   Physical Exam She is alert and orient x3 and in no acute distress.  She walks with a significant limp. Ortho Exam Both hips have severe pain on attempts of rotation of both hips. Specialty Comments:  No specialty comments available.  Imaging: No results found. The MRI of both hips shows significant end-stage avascular necrosis on both hips.  The left hip has some slight subchondral collapse.  It is also impinging on the right hip.  PMFS History: Patient Active Problem List   Diagnosis Date Noted   Avascular necrosis of bone of left hip (HCC) 12/20/2021   Avascular necrosis of bone of right hip (HCC) 12/20/2021   Lumbar spondylosis 10/05/2021   Positive double stranded DNA antibody test 09/07/2021   Mixed hyperlipidemia 08/16/2021   Seizure disorder (HCC) 07/28/2021   Chronic hip pain 07/28/2021   Essential hypertension 07/28/2021   Tobacco abuse 07/28/2021   Polyarthritis 07/28/2021   Anal skin tag 10/21/2019   Internal hemorrhoids 09/26/2017   Asthma 10/15/2015   Allergic rhinitis 10/15/2015   Alcohol abuse 07/14/2015   GAD (generalized anxiety disorder) 07/14/2015   Depression, major, recurrent, moderate (HCC) 07/14/2015   GERD (gastroesophageal reflux disease) 08/22/2013   Past Medical History:  Diagnosis Date   Anxiety    Asthma    hosp. 2018  and 2019 no h/o intubation    Chronic bronchitis (HCC)    "get it pretty much q yr" (12/13/2015)   Depression    Depression    GERD (gastroesophageal reflux disease)    Insomnia    Palpitations    Perianal abscess 11/22/2016   12/07/2016.  [facility], [place].  Ovidio Kin, MD.  Perianal abscess is much better but still needs local wound care.  Continue sitz baths 2-3 times perday until all drainage and pain are gone.  Follow up as needed.   Pneumonia    "multiple times when I was a child; once as an adult so far" (12/13/2015)   Pre-syncope    PVC (premature ventricular contraction)    Seasonal allergies    Seizures (HCC)    thought 2/2 benzo w/d after overuse 03/29/21   Tachycardia     Family History  Problem Relation Age of Onset   Rheumatologic disease Mother    Arthritis Mother    Asthma Mother    Depression Mother    Heart disease Father    Asthma Maternal Grandmother    Hypertension Maternal Grandmother    Heart disease Maternal Grandfather    Lung cancer Paternal Grandmother    Healthy Daughter    Healthy Son     Past Surgical History:  Procedure Laterality Date   CESAREAN SECTION  10/15/2009, 02/08/2006   INCISION AND DRAINAGE PERIRECTAL ABSCESS Bilateral 11/22/2016   Procedure: IRRIGATION AND DEBRIDEMENT PERIRECTAL ABSCESS;  Surgeon: Ovidio Kin, MD;  Location: [facility];  Service: General;  Laterality: Bilateral;   INTRAUTERINE DEVICE INSERTION     Mirena 12/24/19 exp 12/28/21 Dr Luther Parody [facility]   Social History   Occupational History   Occupation: respiratory therapists    Employer: [employer]  Tobacco Use   Smoking status: Every Day    Packs/day: 0.50    Types: Cigarettes   Smokeless tobacco: Never  Vaping Use   Vaping Use: Never used  Substance and Sexual Activity   Alcohol use: No   Drug use: No   Sexual activity: Not on file

## 2021-12-20 NOTE — Telephone Encounter (Signed)
Thank you! I wanted to be sure if she was currently still working so forms are filled out correctly.So they will be filled out for postop.

## 2021-12-21 ENCOUNTER — Telehealth: Payer: Self-pay | Admitting: Orthopaedic Surgery

## 2021-12-21 NOTE — Telephone Encounter (Signed)
Forms to [employer]. 

## 2021-12-22 ENCOUNTER — Encounter: Payer: Self-pay | Admitting: Orthopaedic Surgery

## 2021-12-27 ENCOUNTER — Telehealth: Payer: Self-pay | Admitting: Orthopaedic Surgery

## 2021-12-27 NOTE — Telephone Encounter (Signed)
[employer] forms received. To [employer].

## 2021-12-28 ENCOUNTER — Telehealth: Payer: Self-pay | Admitting: Orthopaedic Surgery

## 2021-12-28 ENCOUNTER — Encounter: Payer: Self-pay | Admitting: Orthopaedic Surgery

## 2021-12-28 NOTE — Telephone Encounter (Signed)
[employer] forms received. To [employer]. 

## 2021-12-29 ENCOUNTER — Other Ambulatory Visit: Payer: Self-pay | Admitting: Physician Assistant

## 2021-12-29 DIAGNOSIS — M87052 Idiopathic aseptic necrosis of left femur: Secondary | ICD-10-CM

## 2022-01-02 ENCOUNTER — Encounter: Payer: Self-pay | Admitting: Orthopaedic Surgery

## 2022-01-09 NOTE — Progress Notes (Signed)
Anesthesia Review:  PCP: Cardiologist : Chest x-ray : EKG :07/14/21  Echo : Stress test: Cardiac Cath :  Activity level:  Sleep Study/ CPAP : Fasting Blood Sugar :      / Checks Blood Sugar -- times a day:   Blood Thinner/ Instructions /Last Dose: ASA / Instructions/ Last Dose :

## 2022-01-09 NOTE — Progress Notes (Signed)
Covid test on 01/17/22.  Come thru main entrance at [facility] have a seat in the lobby on the right as you come thru the door. Call [phone] and let them know you are here for covid tresting.                 Your procedure is scheduled on:    01/20/22.    Report to [facility] Main  Entrance   Report to admitting at   0700am      Call this number if you have problems the morning of surgery [phone]    REMEMBER: NO  SOLID FOOD CANDY OR GUM AFTER MIDNIGHT. CLEAR LIQUIDS UNTIL   0645am        . NOTHING BY MOUTH EXCEPT CLEAR LIQUIDS UNTIL   0645am  . PLEASE FINISH ENSURE DRINK PER SURGEON ORDER  WHICH NEEDS TO BE COMPLETED AT     0645am  .      CLEAR LIQUID DIET   Foods Allowed                                                                    Coffee and tea, regular and decaf                            Fruit ices (not with fruit pulp)                                      Iced Popsicles                                    Carbonated beverages, regular and diet                                    Cranberry, grape and apple juices Sports drinks like Gatorade Lightly seasoned clear broth or consume(fat free) Sugar, honey syrup ___________________________________________________________________      BRUSH YOUR TEETH MORNING OF SURGERY AND RINSE YOUR MOUTH OUT, NO CHEWING GUM CANDY OR MINTS.     Take these medicines the morning of surgery with A SIP OF WATER:  inhalers as  usual and bring, prozac, gabapenitn   DO NOT TAKE ANY DIABETIC MEDICATIONS DAY OF YOUR SURGERY                               You may not have any metal on your body including hair pins and              piercings  Do not wear jewelry, make-up, lotions, powders or perfumes, deodorant             Do not wear nail polish on your fingernails.  Do not shave  48 hours prior to surgery.              Men may shave face and neck.   Do not bring valuables to the hospital. [facility]  IS NOT              RESPONSIBLE   FOR VALUABLES.  Contacts, dentures or bridgework may not be worn into surgery.  Leave suitcase in the car. After surgery it may be brought to your room.     Patients discharged the day of surgery will not be allowed to drive home. IF YOU ARE HAVING SURGERY AND GOING HOME THE SAME DAY, YOU MUST HAVE AN ADULT TO DRIVE YOU HOME AND BE WITH YOU FOR 24 HOURS. YOU MAY GO HOME BY TAXI OR UBER OR ORTHERWISE, BUT AN ADULT MUST ACCOMPANY YOU HOME AND STAY WITH YOU FOR 24 HOURS.  Name and phone number of your driver:  Special Instructions: N/A              Please read over the following fact sheets you were given: _____________________________________________________________________  [facility] - Preparing for Surgery Before surgery, you can play an important role.  Because skin is not sterile, your skin needs to be as free of germs as possible.  You can reduce the number of germs on your skin by washing with CHG (chlorahexidine gluconate) soap before surgery.  CHG is an antiseptic cleaner which kills germs and bonds with the skin to continue killing germs even after washing. Please DO NOT use if you have an allergy to CHG or antibacterial soaps.  If your skin becomes reddened/irritated stop using the CHG and inform your nurse when you arrive at Short Stay. Do not shave (including legs and underarms) for at least 48 hours prior to the first CHG shower.  You may shave your face/neck. Please follow these instructions carefully:  1.  Shower with CHG Soap the night before surgery and the  morning of Surgery.  2.  If you choose to wash your hair, wash your hair first as usual with your  normal  shampoo.  3.  After you shampoo, rinse your hair and body thoroughly to remove the  shampoo.                           4.  Use CHG as you would any other liquid soap.  You can apply chg directly  to the skin and wash                       Gently with a scrungie or clean washcloth.  5.  Apply the CHG Soap  to your body ONLY FROM THE NECK DOWN.   Do not use on face/ open                           Wound or open sores. Avoid contact with eyes, ears mouth and genitals (private parts).                       Wash face,  Genitals (private parts) with your normal soap.             6.  Wash thoroughly, paying special attention to the area where your surgery  will be performed.  7.  Thoroughly rinse your body with warm water from the neck down.  8.  DO NOT shower/wash with your normal soap after using and rinsing off  the CHG Soap.                9.  Pat yourself dry with a  clean towel.            10.  Wear clean pajamas.            11.  Place clean sheets on your bed the night of your first shower and do not  sleep with pets. Day of Surgery : Do not apply any lotions/deodorants the morning of surgery.  Please wear clean clothes to the hospital/surgery center.  FAILURE TO FOLLOW THESE INSTRUCTIONS MAY RESULT IN THE CANCELLATION OF YOUR SURGERY PATIENT SIGNATURE_________________________________  NURSE SIGNATURE__________________________________  ________________________________________________________________________

## 2022-01-11 ENCOUNTER — Encounter (HOSPITAL_COMMUNITY)
Admission: RE | Admit: 2022-01-11 | Discharge: 2022-01-11 | Disposition: A | Discharge status: Home / Self Care | Payer: Commercial Managed Care - PPO | Source: Ambulatory Visit | Attending: Anesthesiology | Admitting: Anesthesiology

## 2022-01-11 DIAGNOSIS — Z01818 Encounter for other preprocedural examination: Secondary | ICD-10-CM

## 2022-01-11 DIAGNOSIS — I10 Essential (primary) hypertension: Secondary | ICD-10-CM

## 2022-01-12 ENCOUNTER — Other Ambulatory Visit: Payer: Self-pay

## 2022-01-17 ENCOUNTER — Other Ambulatory Visit: Payer: Self-pay

## 2022-01-17 ENCOUNTER — Encounter (HOSPITAL_COMMUNITY)
Admission: RE | Admit: 2022-01-17 | Discharge: 2022-01-17 | Disposition: A | Discharge status: Home / Self Care | Payer: Commercial Managed Care - PPO | Source: Ambulatory Visit | Attending: Orthopaedic Surgery | Admitting: Orthopaedic Surgery

## 2022-01-17 ENCOUNTER — Encounter (HOSPITAL_COMMUNITY): Payer: Self-pay

## 2022-01-17 VITALS — BP 126/96 | HR 106 | Temp 97.8°F | Resp 16 | Ht 64.0 in

## 2022-01-17 DIAGNOSIS — I10 Essential (primary) hypertension: Secondary | ICD-10-CM | POA: Diagnosis not present

## 2022-01-17 DIAGNOSIS — Z20822 Contact with and (suspected) exposure to covid-19: Secondary | ICD-10-CM | POA: Diagnosis not present

## 2022-01-17 DIAGNOSIS — M87052 Idiopathic aseptic necrosis of left femur: Secondary | ICD-10-CM

## 2022-01-17 DIAGNOSIS — Z01818 Encounter for other preprocedural examination: Secondary | ICD-10-CM

## 2022-01-17 DIAGNOSIS — F17210 Nicotine dependence, cigarettes, uncomplicated: Secondary | ICD-10-CM | POA: Diagnosis not present

## 2022-01-17 DIAGNOSIS — K219 Gastro-esophageal reflux disease without esophagitis: Secondary | ICD-10-CM | POA: Insufficient documentation

## 2022-01-17 HISTORY — DX: Essential (primary) hypertension: I10

## 2022-01-17 HISTORY — DX: Nausea with vomiting, unspecified: R11.2

## 2022-01-17 LAB — CBC
HCT: 47.2 % — ABNORMAL HIGH (ref 36.0–46.0)
Hemoglobin: 16.3 g/dL — ABNORMAL HIGH (ref 12.0–15.0)
MCH: 34.5 pg — ABNORMAL HIGH (ref 26.0–34.0)
MCHC: 34.5 g/dL (ref 30.0–36.0)
MCV: 99.8 fL (ref 80.0–100.0)
Platelets: 85 10*3/uL — ABNORMAL LOW (ref 150–400)
RBC: 4.73 MIL/uL (ref 3.87–5.11)
RDW: 12.4 % (ref 11.5–15.5)
WBC: 3.5 10*3/uL — ABNORMAL LOW (ref 4.0–10.5)
nRBC: 0 % (ref 0.0–0.2)

## 2022-01-17 LAB — BASIC METABOLIC PANEL
Anion gap: 16 — ABNORMAL HIGH (ref 5–15)
BUN: 19 mg/dL (ref 6–20)
CO2: 25 mmol/L (ref 22–32)
Calcium: 9.5 mg/dL (ref 8.9–10.3)
Chloride: 91 mmol/L — ABNORMAL LOW (ref 98–111)
Creatinine, Ser: 0.61 mg/dL (ref 0.44–1.00)
GFR, Estimated: >60 mL/min (ref >60)
Glucose, Bld: 147 mg/dL — ABNORMAL HIGH (ref 70–99)
Potassium: 2.9 mmol/L — ABNORMAL LOW (ref 3.5–5.1)
Sodium: 132 mmol/L — ABNORMAL LOW (ref 135–145)

## 2022-01-17 LAB — SARS CORONAVIRUS 2 (TAT 6-24 HRS): SARS Coronavirus 2: NEGATIVE

## 2022-01-17 LAB — SURGICAL PCR SCREEN
MRSA, PCR: NEGATIVE
Staphylococcus aureus: POSITIVE — AB

## 2022-01-17 NOTE — Progress Notes (Addendum)
Anesthesia Review:  PCP: DR Ihor Dow [facility].   Cardiologist : none  Chest x-ray : EKG :01/17/22  and 07/14/21.   Echo : Stress test: Cardiac Cath :  Activity level:  can do a flight of stairs without difficulty  Sleep Study/ CPAP : no  Fasting Blood Sugar :      / Checks Blood Sugar -- times a day:   Blood Thinner/ Instructions /Last Dose: ASA / Instructions/ Last Dose :   AT preop appt heart rate was 106 by cynamappa nd blood pressure was 135/107 in right arm and in left arm was 126/96.  PT states" slightly dizzy"a.,  denies any chest pain or shortness of breath.  Has not eaten today .  PT given oranje juice and 6 pack of peanut butter crackers,  Shawn Stall made aware.  EKG done. Shown to [facility], PAC.  PT reports feeling better after crackers and orange juice.  [facility], [facility] madse aware of ekg tracing.  No new orders given.  PT discharged to home.  Covid test completed on 01/17/22.  CBC done 01/17/22 routed to Dr Kathrynn Speed. PLTC- 85. Made Shawn Stall aware.   BMP with potassium of 2.9 routed to Dr Zollie Beckers. jESSICA wARD,pa MADE AWARE.

## 2022-01-18 ENCOUNTER — Emergency Department (HOSPITAL_COMMUNITY): Payer: Commercial Managed Care - PPO

## 2022-01-18 ENCOUNTER — Other Ambulatory Visit: Payer: Self-pay

## 2022-01-18 ENCOUNTER — Observation Stay (HOSPITAL_COMMUNITY)
Admission: EM | Admit: 2022-01-18 | Discharge: 2022-01-20 | Disposition: A | Discharge status: Home / Self Care | Payer: Commercial Managed Care - PPO | Attending: Internal Medicine | Admitting: Internal Medicine

## 2022-01-18 ENCOUNTER — Encounter (HOSPITAL_COMMUNITY): Payer: Self-pay | Admitting: Physician Assistant

## 2022-01-18 ENCOUNTER — Encounter (HOSPITAL_COMMUNITY): Payer: Self-pay

## 2022-01-18 DIAGNOSIS — E872 Acidosis, unspecified: Secondary | ICD-10-CM | POA: Diagnosis not present

## 2022-01-18 DIAGNOSIS — G40909 Epilepsy, unspecified, not intractable, without status epilepticus: Secondary | ICD-10-CM

## 2022-01-18 DIAGNOSIS — R17 Unspecified jaundice: Secondary | ICD-10-CM | POA: Insufficient documentation

## 2022-01-18 DIAGNOSIS — J4520 Mild intermittent asthma, uncomplicated: Secondary | ICD-10-CM

## 2022-01-18 DIAGNOSIS — D696 Thrombocytopenia, unspecified: Secondary | ICD-10-CM

## 2022-01-18 DIAGNOSIS — R7989 Other specified abnormal findings of blood chemistry: Secondary | ICD-10-CM

## 2022-01-18 DIAGNOSIS — E876 Hypokalemia: Secondary | ICD-10-CM | POA: Diagnosis not present

## 2022-01-18 DIAGNOSIS — G40901 Epilepsy, unspecified, not intractable, with status epilepticus: Principal | ICD-10-CM | POA: Insufficient documentation

## 2022-01-18 DIAGNOSIS — E871 Hypo-osmolality and hyponatremia: Secondary | ICD-10-CM | POA: Diagnosis not present

## 2022-01-18 DIAGNOSIS — I10 Essential (primary) hypertension: Secondary | ICD-10-CM | POA: Diagnosis not present

## 2022-01-18 DIAGNOSIS — R739 Hyperglycemia, unspecified: Secondary | ICD-10-CM | POA: Diagnosis not present

## 2022-01-18 DIAGNOSIS — Z79899 Other long term (current) drug therapy: Secondary | ICD-10-CM | POA: Insufficient documentation

## 2022-01-18 DIAGNOSIS — J45909 Unspecified asthma, uncomplicated: Secondary | ICD-10-CM | POA: Diagnosis not present

## 2022-01-18 DIAGNOSIS — M25552 Pain in left hip: Secondary | ICD-10-CM

## 2022-01-18 DIAGNOSIS — M25551 Pain in right hip: Secondary | ICD-10-CM | POA: Diagnosis not present

## 2022-01-18 DIAGNOSIS — R7401 Elevation of levels of liver transaminase levels: Secondary | ICD-10-CM | POA: Insufficient documentation

## 2022-01-18 DIAGNOSIS — F1010 Alcohol abuse, uncomplicated: Secondary | ICD-10-CM | POA: Diagnosis not present

## 2022-01-18 DIAGNOSIS — F17210 Nicotine dependence, cigarettes, uncomplicated: Secondary | ICD-10-CM | POA: Diagnosis not present

## 2022-01-18 DIAGNOSIS — F411 Generalized anxiety disorder: Secondary | ICD-10-CM

## 2022-01-18 DIAGNOSIS — G8929 Other chronic pain: Secondary | ICD-10-CM

## 2022-01-18 DIAGNOSIS — E878 Other disorders of electrolyte and fluid balance, not elsewhere classified: Secondary | ICD-10-CM

## 2022-01-18 LAB — RAPID URINE DRUG SCREEN, HOSP PERFORMED
Amphetamines: NOT DETECTED
Barbiturates: NOT DETECTED
Benzodiazepines: POSITIVE — AB
Cocaine: NOT DETECTED
Opiates: NOT DETECTED
Tetrahydrocannabinol: POSITIVE — AB

## 2022-01-18 LAB — URINALYSIS, ROUTINE W REFLEX MICROSCOPIC
Bilirubin Urine: NEGATIVE
Glucose, UA: NEGATIVE mg/dL
Ketones, ur: 20 mg/dL — AB
Leukocytes,Ua: NEGATIVE
Nitrite: NEGATIVE
Protein, ur: 30 mg/dL — AB
Specific Gravity, Urine: 1.013 (ref 1.005–1.030)
pH: 6 (ref 5.0–8.0)

## 2022-01-18 LAB — COMPREHENSIVE METABOLIC PANEL
ALT: 216 U/L — ABNORMAL HIGH (ref 0–44)
AST: <5 U/L — ABNORMAL LOW (ref 15–41)
Albumin: 3.9 g/dL (ref 3.5–5.0)
Alkaline Phosphatase: 176 U/L — ABNORMAL HIGH (ref 38–126)
Anion gap: 20 — ABNORMAL HIGH (ref 5–15)
BUN: 15 mg/dL (ref 6–20)
CO2: 19 mmol/L — ABNORMAL LOW (ref 22–32)
Calcium: 9.4 mg/dL (ref 8.9–10.3)
Chloride: 90 mmol/L — ABNORMAL LOW (ref 98–111)
Creatinine, Ser: 0.68 mg/dL (ref 0.44–1.00)
GFR, Estimated: >60 mL/min (ref >60)
Glucose, Bld: 227 mg/dL — ABNORMAL HIGH (ref 70–99)
Potassium: 3.2 mmol/L — ABNORMAL LOW (ref 3.5–5.1)
Sodium: 129 mmol/L — ABNORMAL LOW (ref 135–145)
Total Bilirubin: 1.9 mg/dL — ABNORMAL HIGH (ref 0.3–1.2)
Total Protein: 8.3 g/dL — ABNORMAL HIGH (ref 6.5–8.1)

## 2022-01-18 LAB — CBC WITH DIFFERENTIAL/PLATELET
Abs Immature Granulocytes: 0.05 10*3/uL (ref 0.00–0.07)
Basophils Absolute: 0 10*3/uL (ref 0.0–0.1)
Basophils Relative: 1 %
Eosinophils Absolute: 0 10*3/uL (ref 0.0–0.5)
Eosinophils Relative: 0 %
HCT: 47.3 % — ABNORMAL HIGH (ref 36.0–46.0)
Hemoglobin: 16.7 g/dL — ABNORMAL HIGH (ref 12.0–15.0)
Immature Granulocytes: 1 %
Lymphocytes Relative: 15 %
Lymphs Abs: 0.7 10*3/uL (ref 0.7–4.0)
MCH: 35.8 pg — ABNORMAL HIGH (ref 26.0–34.0)
MCHC: 35.3 g/dL (ref 30.0–36.0)
MCV: 101.3 fL — ABNORMAL HIGH (ref 80.0–100.0)
Monocytes Absolute: 0.4 10*3/uL (ref 0.1–1.0)
Monocytes Relative: 8 %
Neutro Abs: 3.3 10*3/uL (ref 1.7–7.7)
Neutrophils Relative %: 75 %
Platelets: 99 10*3/uL — ABNORMAL LOW (ref 150–400)
RBC: 4.67 MIL/uL (ref 3.87–5.11)
RDW: 12.5 % (ref 11.5–15.5)
WBC: 4.4 10*3/uL (ref 4.0–10.5)
nRBC: 0 % (ref 0.0–0.2)

## 2022-01-18 LAB — CBG MONITORING, ED: Glucose-Capillary: 241 mg/dL — ABNORMAL HIGH (ref 70–99)

## 2022-01-18 LAB — MAGNESIUM: Magnesium: 2 mg/dL (ref 1.7–2.4)

## 2022-01-18 LAB — ACETAMINOPHEN LEVEL: Acetaminophen (Tylenol), Serum: <10 ug/mL — ABNORMAL LOW (ref 10–30)

## 2022-01-18 LAB — SALICYLATE LEVEL: Salicylate Lvl: <7 mg/dL — ABNORMAL LOW (ref 7.0–30.0)

## 2022-01-18 LAB — ETHANOL: Alcohol, Ethyl (B): <10 mg/dL (ref <10)

## 2022-01-18 LAB — POC URINE PREG, ED: Preg Test, Ur: NEGATIVE

## 2022-01-18 LAB — CK: Total CK: 40 U/L (ref 38–234)

## 2022-01-18 MED ORDER — LEVETIRACETAM 500 MG PO TABS
500.0000 mg | ORAL_TABLET | Freq: Two times a day (BID) | ORAL | Status: DC
Start: 2022-01-18 — End: 2022-01-20
  Administered 2022-01-18 – 2022-01-20 (×4): 500 mg via ORAL
  Filled 2022-01-18 (×4): qty 1

## 2022-01-18 MED ORDER — OXYCODONE-ACETAMINOPHEN 5-325 MG PO TABS
1.0000 | ORAL_TABLET | Freq: Four times a day (QID) | ORAL | Status: DC | PRN
  Administered 2022-01-18 – 2022-01-20 (×5): 1 via ORAL
  Filled 2022-01-18 (×5): qty 1

## 2022-01-18 MED ORDER — FOLIC ACID 1 MG PO TABS
1.0000 mg | ORAL_TABLET | Freq: Every day | ORAL | Status: DC
  Administered 2022-01-18 – 2022-01-20 (×3): 1 mg via ORAL
  Filled 2022-01-18 (×3): qty 1

## 2022-01-18 MED ORDER — ALBUTEROL SULFATE HFA 108 (90 BASE) MCG/ACT IN AERS: 1.0000 | INHALATION_SPRAY | Freq: Four times a day (QID) | RESPIRATORY_TRACT | Status: DC | PRN

## 2022-01-18 MED ORDER — POTASSIUM CHLORIDE 10 MEQ/100ML IV SOLN
10.0000 meq | Freq: Once | INTRAVENOUS | Status: AC
  Administered 2022-01-18: 10 meq via INTRAVENOUS
  Filled 2022-01-18: qty 100

## 2022-01-18 MED ORDER — POTASSIUM CHLORIDE CRYS ER 20 MEQ PO TBCR
40.0000 meq | EXTENDED_RELEASE_TABLET | Freq: Once | ORAL | Status: AC
  Administered 2022-01-18: 40 meq via ORAL
  Filled 2022-01-18: qty 2

## 2022-01-18 MED ORDER — SODIUM CHLORIDE 0.9 % IV BOLUS
1000.0000 mL | Freq: Once | INTRAVENOUS | Status: AC
  Administered 2022-01-18: 1000 mL via INTRAVENOUS

## 2022-01-18 MED ORDER — ALPRAZOLAM 1 MG PO TABS
1.0000 mg | ORAL_TABLET | Freq: Three times a day (TID) | ORAL | Status: DC
Start: 2022-01-18 — End: 2022-01-20
  Administered 2022-01-18 – 2022-01-20 (×5): 1 mg via ORAL
  Filled 2022-01-18 (×6): qty 1

## 2022-01-18 MED ORDER — LEVETIRACETAM IN NACL 1000 MG/100ML IV SOLN
1000.0000 mg | INTRAVENOUS | Status: AC
  Administered 2022-01-18 (×3): 1000 mg via INTRAVENOUS
  Filled 2022-01-18 (×3): qty 100

## 2022-01-18 MED ORDER — ONDANSETRON HCL 4 MG/2ML IJ SOLN
4.0000 mg | Freq: Once | INTRAMUSCULAR | Status: AC
  Administered 2022-01-18: 4 mg via INTRAVENOUS
  Filled 2022-01-18: qty 2

## 2022-01-18 MED ORDER — ONDANSETRON HCL 4 MG PO TABS: 4.0000 mg | ORAL_TABLET | Freq: Four times a day (QID) | ORAL | Status: DC | PRN

## 2022-01-18 MED ORDER — LORAZEPAM 2 MG/ML IJ SOLN: 2.0000 mg | Freq: Once | INTRAMUSCULAR | Status: AC

## 2022-01-18 MED ORDER — PROCHLORPERAZINE EDISYLATE 10 MG/2ML IJ SOLN
10.0000 mg | Freq: Four times a day (QID) | INTRAMUSCULAR | Status: DC | PRN
  Filled 2022-01-18: qty 2

## 2022-01-18 MED ORDER — POLYETHYLENE GLYCOL 3350 17 G PO PACK: 17.0000 g | PACK | Freq: Every day | ORAL | Status: DC | PRN

## 2022-01-18 MED ORDER — IRBESARTAN 150 MG PO TABS
150.0000 mg | ORAL_TABLET | Freq: Every day | ORAL | Status: DC
Start: 2022-01-18 — End: 2022-01-20
  Administered 2022-01-18 – 2022-01-20 (×3): 150 mg via ORAL
  Filled 2022-01-18 (×3): qty 1

## 2022-01-18 MED ORDER — POTASSIUM CHLORIDE IN NACL 40-0.9 MEQ/L-% IV SOLN: INTRAVENOUS | Status: AC

## 2022-01-18 MED ORDER — THIAMINE HCL 100 MG PO TABS
100.0000 mg | ORAL_TABLET | Freq: Every day | ORAL | Status: DC
  Administered 2022-01-19 – 2022-01-20 (×2): 100 mg via ORAL
  Filled 2022-01-18 (×2): qty 1

## 2022-01-18 MED ORDER — FLUOXETINE HCL 20 MG PO CAPS
80.0000 mg | ORAL_CAPSULE | Freq: Every day | ORAL | Status: DC
  Administered 2022-01-18 – 2022-01-20 (×3): 80 mg via ORAL
  Filled 2022-01-18 (×3): qty 4

## 2022-01-18 MED ORDER — ACETAMINOPHEN 650 MG RE SUPP
650.0000 mg | Freq: Four times a day (QID) | RECTAL | Status: DC | PRN
Start: 2022-01-18 — End: 2022-01-20

## 2022-01-18 MED ORDER — ONDANSETRON HCL 4 MG/2ML IJ SOLN: 4.0000 mg | Freq: Four times a day (QID) | INTRAMUSCULAR | Status: DC | PRN

## 2022-01-18 MED ORDER — THIAMINE HCL 100 MG/ML IJ SOLN
100.0000 mg | Freq: Once | INTRAMUSCULAR | Status: AC
  Administered 2022-01-18: 100 mg via INTRAVENOUS
  Filled 2022-01-18: qty 2

## 2022-01-18 MED ORDER — SODIUM CHLORIDE 0.9 % IV SOLN: 3000.0000 mg | Freq: Once | INTRAVENOUS | Status: DC

## 2022-01-18 MED ORDER — GABAPENTIN 400 MG PO CAPS
1200.0000 mg | ORAL_CAPSULE | Freq: Four times a day (QID) | ORAL | Status: DC
Start: 2022-01-18 — End: 2022-01-20
  Administered 2022-01-18 – 2022-01-20 (×8): 1200 mg via ORAL
  Filled 2022-01-18 (×8): qty 3

## 2022-01-18 MED ORDER — ACETAMINOPHEN 325 MG PO TABS: 650.0000 mg | ORAL_TABLET | Freq: Four times a day (QID) | ORAL | Status: DC | PRN

## 2022-01-18 MED ORDER — LORAZEPAM 2 MG/ML IJ SOLN
INTRAMUSCULAR | Status: AC
  Administered 2022-01-18: 2 mg via INTRAVENOUS
  Filled 2022-01-18: qty 1

## 2022-01-18 MED ORDER — ROSUVASTATIN CALCIUM 10 MG PO TABS
10.0000 mg | ORAL_TABLET | Freq: Every day | ORAL | Status: DC
  Administered 2022-01-18: 10 mg via ORAL
  Filled 2022-01-18: qty 1

## 2022-01-18 NOTE — Assessment & Plan Note (Signed)
Resume Prozac, Xanax 1 mg 3 times daily.

## 2022-01-18 NOTE — H&P (Addendum)
History and Physical    Kelsey [name] ZOX:[id number] DOB: 1981/10/25 DOA: 01/18/2022  PCP: Kelsey HalonPatel, Rutwik K, [name]   Patient coming from: Home.  I have personally briefly reviewed patient's old medical records [facility]  Chief Complaint: Seizure  HPI: Kelsey [name] is a 41 y.o. 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 Patient was brought to the ED EMS after she was found lying in her yard.  Patient reports the last thing she remembers is walking outside, then everything turned black, she called for help and next she remembers was waking up in the ED. She lives with her husband, but he was at work. Patient has a history of seizure, and is supposed to be on seizure medications- keppra.  She has not taken her seizure medications in a week.  She tells me she is on a lot of medications, and is hoping to reduce the number. She was hoping she does not need to be on seizure medications anymore so she stopped taking it.  She was diagnosed with seizures about a year ago, she reports her last seizure prior to today was ~2 months ago. She is also on Xanax and has been taking that.  She tells me she has not drunk any alcohol in a month.  She used to drink about 3 shots of vodka a week to help with her chronic hip pain. Patient tells me also over the past several days she has not had an appetite, she has barely been eating.  No vomiting no loose stools.  No abdominal pain.  No fevers.  She went to anesthesiologist office yesterday (preop for hip surgery) and complained of dizziness.  ED Course: Tachycardic 108-121.  Respirate rate 18-23.  Blood pressure systolic 120s to [id number].  Potassium 3.2.  Sodium 129.  Head and cervical CT without acute abnormality.  Telemetry neurologist Dr. Amada JupiterKirkpatrick was consulted, recommended loading with Keppra, can discharge home if has home support, or  admit.  Review of Systems: As per HPI all other systems reviewed and negative.  Past Medical History:  Diagnosis Date   Anxiety    Asthma    hosp. 2018 and 2019 no h/o intubation    Chronic bronchitis (HCC)    "get it pretty much q yr" (12/13/2015)   Depression    Depression    GERD (gastroesophageal reflux disease)    Hypertension    Insomnia    Palpitations    Perianal abscess 11/22/2016   12/07/2016.  [facility] [place]  Ovidio Kinavid Newman, [name].  Perianal abscess is much better but still needs local wound care.  Continue sitz baths 2-3 times perday until all drainage and pain are gone.  Follow up as needed.   PONV (postoperative nausea and vomiting)    Pre-syncope    PVC (premature ventricular contraction)    Seasonal allergies    Seizures (HCC)    thought 2/2 benzo w/d after overuse 03/29/21   Tachycardia     Past Surgical History:  Procedure Laterality Date   CESAREAN SECTION  10/15/2009, 02/08/2006   INCISION AND DRAINAGE PERIRECTAL ABSCESS Bilateral 11/22/2016   Procedure: IRRIGATION AND DEBRIDEMENT PERIRECTAL ABSCESS;  Surgeon: Ovidio Kinavid Newman, [name];  Location: [facility];  Service: General;  Laterality: Bilateral;   INTRAUTERINE DEVICE INSERTION     Mirena 12/24/19 exp 12/28/21 Dr Luther ParodySidney Tami Ribasallahan [facility]     reports that she has been smoking cigarettes.  She has a 2.50 pack-year smoking history. She has never used smokeless tobacco. She reports that she does not drink alcohol and does not use drugs.  Allergies  Allergen Reactions   Penicillins Rash    Has patient had a PCN reaction causing immediate rash, facial/tongue/throat swelling, SOB or lightheadedness with hypotension: No Has patient had a PCN reaction causing severe rash involving mucus membranes or skin necrosis: No Has patient had a PCN reaction that required hospitalization No Has patient had a PCN reaction occurring within the last 10 years: No If all of the above answers are "NO", then may proceed  with Cephalosporin use.    Family History  Problem Relation Age of Onset   Rheumatologic disease Mother    Arthritis Mother    Asthma Mother    Depression Mother    Heart disease Father    Asthma Maternal Grandmother    Hypertension Maternal Grandmother    Heart disease Maternal Grandfather    Lung cancer Paternal Grandmother    Healthy Daughter    Healthy Son    Prior to Admission medications   Medication Sig Start Date End Date Taking? Authorizing Provider  albuterol (VENTOLIN HFA) 108 (90 Base) MCG/ACT inhaler Inhale 1-2 puffs into the lungs every 6 (six) hours as needed for wheezing or shortness of breath. 08/16/21  Yes Kelsey Halon, [name]  ALPRAZolam Prudy Feeler) 1 MG tablet Take 1 tablet (1 mg total) by mouth at bedtime as needed for anxiety. Patient taking differently: Take 1 mg by mouth 3 (three) times daily. 10/02/20  Yes Devoria Albe, [name]  cyclobenzaprine (FLEXERIL) 10 MG tablet Take 1 tablet (10 mg total) by mouth every 12 (twelve) hours as needed for muscle spasms. 11/11/21  Yes Kelsey Halon, [name]  diphenhydrAMINE (BENADRYL) 25 MG tablet Take 25 mg by mouth every 6 (six) hours as needed for allergies.   Yes Provider, Historical, [name]  FLUoxetine (PROZAC) 40 MG capsule Take 80 mg by mouth daily.   Yes Provider, Historical, [name]  gabapentin (NEURONTIN) 300 MG capsule TAKE 2 CAPSULES (600 MG TOTAL) BY MOUTH 3 (THREE) TIMES DAILY. Patient taking differently: Take 1,200 mg by mouth 4 (four) times daily. 05/03/18  Yes McLean-Scocuzza, Pasty Spillers, [name]  ibuprofen (ADVIL) 200 MG tablet Take 400 mg by mouth every 6 (six) hours as needed for moderate pain or headache.   Yes Provider, Historical, [name]  levETIRAcetam (KEPPRA) 500 MG tablet Take 1 tablet (500 mg total) by mouth 2 (two) times daily. Patient taking differently: Take 500 mg by mouth at bedtime. 07/14/21  Yes Pollyann Savoy, [name]  levonorgestrel (MIRENA) 20 MCG/24HR IUD 1 Intra Uterine Device (1 each total) by Intrauterine route once. 07/16/15   Yes Adonis Brook, NP  naproxen (NAPROSYN) 500 MG tablet Take 500 mg by mouth 2 (two) times daily as needed for moderate pain.   Yes Provider, Historical, [name]  oxyCODONE-acetaminophen (PERCOCET/ROXICET) 5-325 MG tablet Take 1 tablet by mouth every 6 (six) hours as needed for severe pain. 12/13/21  Yes Idol, Raynelle Fanning, PA-C  rosuvastatin (CRESTOR) 10 MG tablet Take 1 tablet (10 mg total) by mouth daily. 08/16/21  Yes Kelsey Halon, [name]  telmisartan-hydrochlorothiazide (MICARDIS HCT) 40-12.5 MG tablet TAKE 1 TABLET BY MOUTH EVERY DAY 09/05/21  Yes Kelsey Halon, [name]  diazepam (VALIUM) 10 MG tablet Take 1 tablet 30 minutes before the procedure. Patient not taking: Reported on 01/04/2022 11/15/21   Kelsey Halon, [name]    Physical Exam: Vitals:   01/18/22  1225 01/18/22 1227 01/18/22 1300 01/18/22 1330  BP: (!) 138/111  (!) 150/114 (!) 128/99  Pulse: (!) 108  (!) 121 (!) 117  Resp: (!) 22  (!) 23 18  Temp: 97.8 F (36.6 C)     TempSrc: Oral     SpO2: 99%  99% 100%  Weight:  71.2 kg    Height:  5\' 4"  (1.626 m)      Constitutional: NAD, calm, comfortable Vitals:   01/18/22 1225 01/18/22 1227 01/18/22 1300 01/18/22 1330  BP: (!) 138/111  (!) 150/114 (!) 128/99  Pulse: (!) 108  (!) 121 (!) 117  Resp: (!) 22  (!) 23 18  Temp: 97.8 F (36.6 C)     TempSrc: Oral     SpO2: 99%  99% 100%  Weight:  71.2 kg    Height:  5\' 4"  (1.626 m)     Eyes: PERRL, lids and conjunctivae normal ENMT: Mucous membranes are moist.   Neck: normal, supple, no masses, no thyromegaly Respiratory: clear to auscultation bilaterally, no wheezing, no crackles. Normal respiratory effort. No accessory muscle use.  Cardiovascular: Regular rate and rhythm, no murmurs / rubs / gallops. No extremity edema. 2+ pedal pulses.  Abdomen: no tenderness, no masses palpated. No hepatosplenomegaly. Bowel sounds positive.  Musculoskeletal: no clubbing / cyanosis.  Limited movements to left hip due to chronic pain. Skin: no rashes,  lesions, ulcers. No induration Neurologic: Back to baseline, no facial asymmetry, no evidence of aphasia, moving all extremities spontaneously. Psychiatric: Normal judgment and insight. Alert and oriented x 3. Normal mood.   Labs on Admission: I have personally reviewed following labs and imaging studies  CBC: Recent Labs  Lab 01/17/22 1143 01/18/22 1251  WBC 3.5* 4.4  NEUTROABS  --  3.3  HGB 16.3* 16.7*  HCT 47.2* 47.3*  MCV 99.8 101.3*  PLT 85* 99*   Basic Metabolic Panel: Recent Labs  Lab 01/17/22 1143 01/18/22 1251  NA 132* 129*  K 2.9* 3.2*  CL 91* 90*  CO2 25 19*  GLUCOSE 147* 227*  BUN 19 15  CREATININE 0.61 0.68  CALCIUM 9.5 9.4  MG  --  2.0   GFR: Estimated Creatinine Clearance: 89.6 mL/min (by C-G formula based on SCr of 0.68 mg/dL). Liver Function Tests: Recent Labs  Lab 01/18/22 1251  AST <5*  ALT 216*  ALKPHOS 176*  BILITOT 1.9*  PROT 8.3*  ALBUMIN 3.9   Cardiac Enzymes: Recent Labs  Lab 01/18/22 1251  CKTOTAL 40    CBG: Recent Labs  Lab 01/18/22 1236  GLUCAP 241*   Radiological Exams on Admission: CT Head Wo Contrast  Result Date: 01/18/2022 CLINICAL DATA:  Seizure, found down. EXAM: CT HEAD WITHOUT CONTRAST CT CERVICAL SPINE WITHOUT CONTRAST TECHNIQUE: Multidetector CT imaging of the head and cervical spine was performed following the standard protocol without intravenous contrast. Multiplanar CT image reconstructions of the cervical spine were also generated. RADIATION DOSE REDUCTION: This exam was performed according to the departmental dose-optimization program which includes automated exposure control, adjustment of the mA and/or kV according to patient size and/or use of iterative reconstruction technique. COMPARISON:  CT head dated 07/14/2021 and cervical spine radiographs dated 07/01/2016. FINDINGS: CT HEAD FINDINGS Brain: No evidence of acute infarction, hemorrhage, hydrocephalus, extra-axial collection or mass lesion/mass effect.  Vascular: No hyperdense vessel or unexpected calcification. Skull: Normal. Negative for fracture or focal lesion. Sinuses/Orbits: No acute finding. Other: None. CT CERVICAL SPINE FINDINGS Alignment: Normal. Skull base and vertebrae: No acute fracture.  No primary bone lesion or focal pathologic process. Soft tissues and spinal canal: No prevertebral fluid or swelling. No visible canal hematoma. Disc levels:  Preserved. Upper chest: Negative. Other: None. IMPRESSION: 1. No acute intracranial process. 2. No acute osseous injury in the cervical spine. Electronically Signed   By: Romona Curls M.D.   On: 01/18/2022 14:13   CT Cervical Spine Wo Contrast  Result Date: 01/18/2022 CLINICAL DATA:  Seizure, found down. EXAM: CT HEAD WITHOUT CONTRAST CT CERVICAL SPINE WITHOUT CONTRAST TECHNIQUE: Multidetector CT imaging of the head and cervical spine was performed following the standard protocol without intravenous contrast. Multiplanar CT image reconstructions of the cervical spine were also generated. RADIATION DOSE REDUCTION: This exam was performed according to the departmental dose-optimization program which includes automated exposure control, adjustment of the mA and/or kV according to patient size and/or use of iterative reconstruction technique. COMPARISON:  CT head dated 07/14/2021 and cervical spine radiographs dated 07/01/2016. FINDINGS: CT HEAD FINDINGS Brain: No evidence of acute infarction, hemorrhage, hydrocephalus, extra-axial collection or mass lesion/mass effect. Vascular: No hyperdense vessel or unexpected calcification. Skull: Normal. Negative for fracture or focal lesion. Sinuses/Orbits: No acute finding. Other: None. CT CERVICAL SPINE FINDINGS Alignment: Normal. Skull base and vertebrae: No acute fracture. No primary bone lesion or focal pathologic process. Soft tissues and spinal canal: No prevertebral fluid or swelling. No visible canal hematoma. Disc levels:  Preserved. Upper chest: Negative.  Other: None. IMPRESSION: 1. No acute intracranial process. 2. No acute osseous injury in the cervical spine. Electronically Signed   By: Romona Curls M.D.   On: 01/18/2022 14:13    EKG: Independently reviewed.  Sinus tachycardia rate 107, QTc 534.  Assessment/Plan Principal Problem:   Seizure disorder (HCC) Active Problems:   Alcohol abuse   GAD (generalized anxiety disorder)   Depression, major, recurrent, moderate (HCC)   Asthma   Chronic hip pain   Essential hypertension   Assessment and Plan: * Seizure disorder (HCC) Seizures today secondary to noncompliance with antiseizure medications Keppra over the past week.  Patient trying to cut down the number of pills she takes, was hoping she did not need it anymore.  On Xanax, has not missed any doses.  Reports last alcoholic beverage was a month ago. -Teleneurology consulted, recommends resuming home antiseizure medications -Resume gabapentin 1200 4 times daily -Resume Xanax 1 mg 3 times daily  Alcohol abuse- (present on admission) Reports last alcoholic beverage was a month ago.  Reports drinking 3 shots of vodka 3 times a week for chronic hip pain.  Liver enzymes mildly elevated- ALT 216, ALP 176, total bilirubin 1.9.  But AST is unremarkable - < 5.  Not consistent with alcoholic liver disease.  Abdominal exam benign. -Thiamine folate -Trend liver enzymes - Pending liver enzyme in a.m, may need RUQ [place]. -Acute hepatitis panel  Thrombocytopenia (HCC)- (present on admission) New- Platelets 99.  Baseline 200s to 300s. ??Related to alcohol hx..  Per Janann August note 2/21, patient may not be able to have spinal anesthesia. -Trend.  Electrolyte abnormality- (present on admission) Hyponatremia sodium 129.  Hypokalemia potassium 3.2.  Normal magnesium at 2.  Likely for poor oral intake over the past few days.  She is also on HCTZ. -Hydrate 2 L bolus given, continue N/s + 40 kcl 100cc/hr x 10hrs -Replete Electrolytes  Essential  hypertension- (present on admission) Stable. Hold HCTZ for now to allow for hydration.  Resume telmisartan  Chronic hip pain- (present on admission) Resume home pain meds percocet  and gabapentin. She is scheduled for hip replacement 2/24.  Asthma- (present on admission) Stable, resume home albuterol inhaler  GAD (generalized anxiety disorder)- (present on admission) Resume Prozac, Xanax 1 mg 3 times daily.   DVT prophylaxis: SCDS Code Status: Full Family Communication: None at bedside Disposition Plan: ~ 1 day Consults called: teleneurology Admission status: Obs tele    Onnie Boer [name] [facility]  01/18/2022, 4:49 PM

## 2022-01-18 NOTE — Assessment & Plan Note (Addendum)
Last Etoh 01/14/22 AST>ALT on liver enzymes -CIWA>>no signs of withdrawal during hospitalization -Thiamine folate -Trend liver enzymes -Acute hepatitis panel--neg

## 2022-01-18 NOTE — ED Triage Notes (Signed)
Patient via EMS that was found lying in yard. Appeared to be postictal had loss of bladder control.

## 2022-01-18 NOTE — Plan of Care (Signed)
Discussed with Dr. Jodi Mourning via phone.   41 yo F with a known history of seizures who presents with two seizures. She was able interictally to relate that she had not taken her medications for a week or so. After her second seizure here, she was given ativan and loaded with keppra 1g and is now significantly improved.   At home she takes gabapentin 1200mg  QID  and keppra 500mg  BID according to her chart. I would administer at least one dose of each prior to discharge from the ER.   As long as the patient returns to baseline, and is seizure free with keppra/ativan load for several hours, and has some degree of support at home,  she likely could be discharged home with resumption of her home AED regimen. If admitted, would resum her home AEDs unless there is some compelling reason or adverse effect. Neurology will be available for questions/formal consult on this patient as needed.   , MD [facility] [phone]  If 7pm- 7am, please page neurology on call as listed in AMION.

## 2022-01-18 NOTE — ED Provider Notes (Signed)
[facility]Y [facility] Provider Note   CSN: [id number] Arrival date & time: 01/18/22  1206     History  Chief Complaint  Patient presents with   Seizures    Kelsey RailJennifer L Jeanty is a 41 y.o. female.  Patient presents with concern for seizure.  Patient was found lying in the yard and appeared to be postictal with loss of bladder control.  Patient has known seizure history and reported EMS has not taken medications in over a week.  Unknown if head injury or other injuries.  Patient had an additional seizure shortly after arrival.  Unknown last alcohol intake.  Difficulty getting details due to postictal state.      Home Medications Prior to Admission medications   Medication Sig Start Date End Date Taking? Authorizing Provider  albuterol (VENTOLIN HFA) 108 (90 Base) MCG/ACT inhaler Inhale 1-2 puffs into the lungs every 6 (six) hours as needed for wheezing or shortness of breath. 08/16/21  Yes Anabel HalonPatel, Rutwik K, MD  ALPRAZolam Prudy Feeler(XANAX) 1 MG tablet Take 1 tablet (1 mg total) by mouth at bedtime as needed for anxiety. Patient taking differently: Take 1 mg by mouth 3 (three) times daily. 10/02/20  Yes Devoria AlbeKnapp, Iva, MD  cyclobenzaprine (FLEXERIL) 10 MG tablet Take 1 tablet (10 mg total) by mouth every 12 (twelve) hours as needed for muscle spasms. 11/11/21  Yes Anabel HalonPatel, Rutwik K, MD  diphenhydrAMINE (BENADRYL) 25 MG tablet Take 25 mg by mouth every 6 (six) hours as needed for allergies.   Yes [provider]  FLUoxetine (PROZAC) 40 MG capsule Take 80 mg by mouth daily.   Yes [provider]  gabapentin (NEURONTIN) 300 MG capsule TAKE 2 CAPSULES (600 MG TOTAL) BY MOUTH 3 (THREE) TIMES DAILY. Patient taking differently: Take 1,200 mg by mouth 4 (four) times daily. 05/03/18  Yes McLean-Scocuzza, Pasty Spillersracy N, MD  ibuprofen (ADVIL) 200 MG tablet Take 400 mg by mouth every 6 (six) hours as needed for moderate pain or headache.   Yes [provider]  levETIRAcetam (KEPPRA) 500  MG tablet Take 1 tablet (500 mg total) by mouth 2 (two) times daily. Patient taking differently: Take 500 mg by mouth at bedtime. 07/14/21  Yes Pollyann SavoySheldon, Charles B, MD  levonorgestrel (MIRENA) 20 MCG/24HR IUD 1 Intra Uterine Device (1 each total) by Intrauterine route once. 07/16/15  Yes Adonis BrookAgustin, Sheila, NP  naproxen (NAPROSYN) 500 MG tablet Take 500 mg by mouth 2 (two) times daily as needed for moderate pain.   Yes [provider]  oxyCODONE-acetaminophen (PERCOCET/ROXICET) 5-325 MG tablet Take 1 tablet by mouth every 6 (six) hours as needed for severe pain. 12/13/21  Yes Idol, Raynelle FanningJulie, PA-C  rosuvastatin (CRESTOR) 10 MG tablet Take 1 tablet (10 mg total) by mouth daily. 08/16/21  Yes Anabel HalonPatel, Rutwik K, MD  telmisartan-hydrochlorothiazide (MICARDIS HCT) 40-12.5 MG tablet TAKE 1 TABLET BY MOUTH EVERY DAY 09/05/21  Yes Anabel HalonPatel, Rutwik K, MD  diazepam (VALIUM) 10 MG tablet Take 1 tablet 30 minutes before the procedure. Patient not taking: Reported on 01/04/2022 11/15/21   Anabel HalonPatel, Rutwik K, MD      Allergies    Penicillins    Review of Systems   Review of Systems  Unable to perform ROS: Mental status change   Physical Exam Updated Vital Signs BP (!) 128/99    Pulse (!) 117    Temp 97.8 F (36.6 C) (Oral)    Resp 18    Ht 5\' 4"  (1.626 m)    Wt 71.2  kg    SpO2 100%    BMI 26.95 kg/m  Physical Exam Vitals and nursing note reviewed.  Constitutional:      General: She is not in acute distress.    Appearance: She is well-developed.  HENT:     Head: Normocephalic and atraumatic.     Mouth/Throat:     Mouth: Mucous membranes are dry.  Eyes:     General:        Right eye: No discharge.        Left eye: No discharge.     Conjunctiva/sclera: Conjunctivae normal.  Neck:     Trachea: No tracheal deviation.  Cardiovascular:     Rate and Rhythm: Regular rhythm. Tachycardia present.     Heart sounds: No murmur heard. Pulmonary:     Effort: Pulmonary effort is normal.     Breath sounds: Normal  breath sounds.  Abdominal:     General: There is no distension.     Palpations: Abdomen is soft.     Tenderness: There is no abdominal tenderness. There is no guarding.  Musculoskeletal:        General: No swelling.     Cervical back: Normal range of motion and neck supple. No rigidity.  Skin:    General: Skin is warm.     Capillary Refill: Capillary refill takes less than 2 seconds.     Findings: No rash.  Neurological:     Mental Status: She is alert.     GCS: GCS eye subscore is 3. GCS verbal subscore is 4. GCS motor subscore is 5.     Comments: Patient has pupils 4 mm bilateral equally responsive to light, horizontal eye movements grossly intact however difficult exam as patient not cooperating at this time.  Psychiatric:     Comments: Mild agitation after postictal state    ED Results / Procedures / Treatments   Labs (all labs ordered are listed, but only abnormal results are displayed) Labs Reviewed  COMPREHENSIVE METABOLIC PANEL - Abnormal; Notable for the following components:      Result Value   Sodium 129 (*)    Potassium 3.2 (*)    Chloride 90 (*)    CO2 19 (*)    Glucose, Bld 227 (*)    Total Protein 8.3 (*)    AST <5 (*)    ALT 216 (*)    Alkaline Phosphatase 176 (*)    Total Bilirubin 1.9 (*)    Anion gap 20 (*)    All other components within normal limits  CBC WITH DIFFERENTIAL/PLATELET - Abnormal; Notable for the following components:   Hemoglobin 16.7 (*)    HCT 47.3 (*)    MCV 101.3 (*)    MCH 35.8 (*)    Platelets 99 (*)    All other components within normal limits  ACETAMINOPHEN LEVEL - Abnormal; Notable for the following components:   Acetaminophen (Tylenol), Serum <10 (*)    All other components within normal limits  SALICYLATE LEVEL - Abnormal; Notable for the following components:   Salicylate Lvl <7.0 (*)    All other components within normal limits  CBG MONITORING, ED - Abnormal; Notable for the following components:   Glucose-Capillary  241 (*)    All other components within normal limits  MAGNESIUM  CK  ETHANOL  RAPID URINE DRUG SCREEN, HOSP PERFORMED  URINALYSIS, ROUTINE W REFLEX MICROSCOPIC  POC URINE PREG, ED  I-STAT BETA HCG BLOOD, ED ([facility], [facility], [facility] ONLY)  EKG None  Radiology CT Head Wo Contrast  Result Date: 01/18/2022 CLINICAL DATA:  Seizure, found down. EXAM: CT HEAD WITHOUT CONTRAST CT CERVICAL SPINE WITHOUT CONTRAST TECHNIQUE: Multidetector CT imaging of the head and cervical spine was performed following the standard protocol without intravenous contrast. Multiplanar CT image reconstructions of the cervical spine were also generated. RADIATION DOSE REDUCTION: This exam was performed according to the departmental dose-optimization program which includes automated exposure control, adjustment of the mA and/or kV according to patient size and/or use of iterative reconstruction technique. COMPARISON:  CT head dated 07/14/2021 and cervical spine radiographs dated 07/01/2016. FINDINGS: CT HEAD FINDINGS Brain: No evidence of acute infarction, hemorrhage, hydrocephalus, extra-axial collection or mass lesion/mass effect. Vascular: No hyperdense vessel or unexpected calcification. Skull: Normal. Negative for fracture or focal lesion. Sinuses/Orbits: No acute finding. Other: None. CT CERVICAL SPINE FINDINGS Alignment: Normal. Skull base and vertebrae: No acute fracture. No primary bone lesion or focal pathologic process. Soft tissues and spinal canal: No prevertebral fluid or swelling. No visible canal hematoma. Disc levels:  Preserved. Upper chest: Negative. Other: None. IMPRESSION: 1. No acute intracranial process. 2. No acute osseous injury in the cervical spine. Electronically Signed   By: Romona Curls M.D.   On: 01/18/2022 14:13   CT Cervical Spine Wo Contrast  Result Date: 01/18/2022 CLINICAL DATA:  Seizure, found down. EXAM: CT HEAD WITHOUT CONTRAST CT CERVICAL SPINE WITHOUT CONTRAST TECHNIQUE: Multidetector CT imaging  of the head and cervical spine was performed following the standard protocol without intravenous contrast. Multiplanar CT image reconstructions of the cervical spine were also generated. RADIATION DOSE REDUCTION: This exam was performed according to the departmental dose-optimization program which includes automated exposure control, adjustment of the mA and/or kV according to patient size and/or use of iterative reconstruction technique. COMPARISON:  CT head dated 07/14/2021 and cervical spine radiographs dated 07/01/2016. FINDINGS: CT HEAD FINDINGS Brain: No evidence of acute infarction, hemorrhage, hydrocephalus, extra-axial collection or mass lesion/mass effect. Vascular: No hyperdense vessel or unexpected calcification. Skull: Normal. Negative for fracture or focal lesion. Sinuses/Orbits: No acute finding. Other: None. CT CERVICAL SPINE FINDINGS Alignment: Normal. Skull base and vertebrae: No acute fracture. No primary bone lesion or focal pathologic process. Soft tissues and spinal canal: No prevertebral fluid or swelling. No visible canal hematoma. Disc levels:  Preserved. Upper chest: Negative. Other: None. IMPRESSION: 1. No acute intracranial process. 2. No acute osseous injury in the cervical spine. Electronically Signed   By: Romona Curls M.D.   On: 01/18/2022 14:13    Procedures .Critical Care Performed by: Blane Ohara, MD Authorized by: Blane Ohara, MD   Critical care provider statement:    Critical care time (minutes):  75   Critical care start time:  01/18/2022 12:40 PM   Critical care end time:  01/18/2022 2:05 PM   Critical care time was exclusive of:  Separately billable procedures and treating other patients and teaching time   Critical care was necessary to treat or prevent imminent or life-threatening deterioration of the following conditions:  CNS failure or compromise   Critical care was time spent personally by me on the following activities:  Pulse oximetry, re-evaluation  of patient's condition, review of old charts, examination of patient, evaluation of patient's response to treatment, ordering and review of radiographic studies and ordering and review of laboratory studies    Medications Ordered in ED Medications  potassium chloride 10 mEq in 100 mL IVPB (10 mEq Intravenous New Bag/Given 01/18/22 1516)  sodium chloride  0.9 % bolus 1,000 mL (1,000 mLs Intravenous New Bag/Given 01/18/22 1305)  LORazepam (ATIVAN) injection 2 mg (2 mg Intravenous Given 01/18/22 1254)  levETIRAcetam (KEPPRA) IVPB 1000 mg/100 mL premix (1,000 mg Intravenous New Bag/Given 01/18/22 1413)  ondansetron (ZOFRAN) injection 4 mg (4 mg Intravenous Given 01/18/22 1359)  potassium chloride 10 mEq in 100 mL IVPB (10 mEq Intravenous New Bag/Given 01/18/22 1428)  thiamine (B-1) injection 100 mg (100 mg Intravenous Given 01/18/22 1429)  sodium chloride 0.9 % bolus 1,000 mL (1,000 mLs Intravenous New Bag/Given 01/18/22 1420)    ED Course/ Medical Decision Making/ A&P                           Medical Decision Making Amount and/or Complexity of Data Reviewed Labs: ordered. Radiology: ordered.  Risk Prescription drug management. Decision regarding hospitalization.   Patient presents after being found down on the ground with history of seizures.  Clinical concern for seizures/status epilepticus, other differentials include arrhythmia, metabolic, tox related, intracranial, infectious, other.  Afebrile here on arrival, plan for general blood work.  Patient had recurrent generalized tonic-clonic seizure lasting 1 to 2 minutes shortly after arrival.  2 mg Ativan given.  Keppra bolus ordered.  No further seizure activity. CT scan of the head pending.  With multiple seizures concern for status as patient has not returned to baseline in between.  Plan for admission for observation and further management.  Discussed with neurology who agreed with observation in the hospital or if patient continues to be  monitored for hours in the ER feels comfortable she can go home.  Patient comfortable with observation, significant other only there at night and she had 3 seizures today and electrolyte abnormalities.  Discussed with hospitalist who agrees with the plan.        Final Clinical Impression(s) / ED Diagnoses Final diagnoses:  Status epilepticus (HCC)  LFT elevation  Hypokalemia  Hyponatremia  Hyperglycemia  Metabolic acidosis    Rx / DC Orders ED Discharge Orders     None         Blane Ohara, MD 01/18/22 1535

## 2022-01-18 NOTE — Progress Notes (Signed)
Anesthesia Chart Review   Case: [id number] Date/Time: 01/20/22 0930   Procedure: Left TOTAL HIP ARTHROPLASTY ANTERIOR APPROACH (Left: Hip)   Anesthesia type: Spinal   Pre-op diagnosis: Avascular necrosis left hip   Location: [facility] 09 / [facility]   Surgeons: Kathryne Hitch, MD       DISCUSSION:40 y.o. every day smoker with h/o PONV, GERD, HTN, AVN scheduled for above procedure 01/20/2022 with Dr. Doneen Poisson.   At PAT visit pt reported she was not feeling well, slightly dizzy. In light of this and abnormal labs pt would benefit from evaluation by PCP for better optimization prior to surgery.  Thrombocytopenia with no clear reason, needs further eval, unable to have spinal. Discussed with Dr. Eliberto Ivory office. VS: BP (!) 126/96    Pulse (!) 106    Temp 36.6 C (Oral)    Resp 16    Ht 5\' 4"  (1.626 m)    SpO2 97%    BMI 27.02 kg/m   PROVIDERS: , MD is PCP    LABS:  forwarded to surgeon and PCP  (all labs ordered are listed, but only abnormal results are displayed)  Labs Reviewed  SURGICAL PCR SCREEN - Abnormal; Notable for the following components:      Result Value   Staphylococcus aureus POSITIVE (*)    All other components within normal limits  BASIC METABOLIC PANEL - Abnormal; Notable for the following components:   Sodium 132 (*)    Potassium 2.9 (*)    Chloride 91 (*)    Glucose, Bld 147 (*)    Anion gap 16 (*)    All other components within normal limits  CBC - Abnormal; Notable for the following components:   WBC 3.5 (*)    Hemoglobin 16.3 (*)    HCT 47.2 (*)    MCH 34.5 (*)    Platelets 85 (*)    All other components within normal limits  SARS CORONAVIRUS 2 (TAT 6-24 HRS)  TYPE AND SCREEN     IMAGES:   EKG: 01/17/2022 Rate 107 bpm  Sinus tachycardia  Anteroseptal infarct, age undetermined  CV: Echo 04/17/2011 Study Conclusions   - Left ventricle: The cavity size was normal. Wall thickness was    normal. Systolic function  was normal. The estimated ejection    fraction was in the range of 55% to 65%. Wall motion was normal;    there were no regional wall motion abnormalities. Left ventricular    diastolic function parameters were normal.  - Atrial septum: No defect or patent foramen ovale was identified.  Past Medical History:  Diagnosis Date   Anxiety    Asthma    hosp. 2018 and 2019 no h/o intubation    Chronic bronchitis (HCC)    "get it pretty much q yr" (12/13/2015)   Depression    Depression    GERD (gastroesophageal reflux disease)    Hypertension    Insomnia    Palpitations    Perianal abscess 11/22/2016   12/07/2016.  [facility], [facility].  [place], [place].  Perianal abscess is much better but still needs local wound care.  Continue sitz baths 2-3 times perday until all drainage and pain are gone.  Follow up as needed.   PONV (postoperative nausea and vomiting)    Pre-syncope    PVC (premature ventricular contraction)    Seasonal allergies    Seizures (HCC)    thought 2/2 benzo w/d after overuse 03/29/21   Tachycardia  Past Surgical History:  Procedure Laterality Date   CESAREAN SECTION  10/15/2009, 02/08/2006   INCISION AND DRAINAGE PERIRECTAL ABSCESS Bilateral 11/22/2016   Procedure: IRRIGATION AND DEBRIDEMENT PERIRECTAL ABSCESS;  Surgeon: Ovidio Kin, MD;  Location: [facility];  Service: General;  Laterality: Bilateral;   INTRAUTERINE DEVICE INSERTION     Mirena 12/24/19 exp 12/28/21 Dr Philip Aspen [facility]    MEDICATIONS:  albuterol (VENTOLIN HFA) 108 (90 Base) MCG/ACT inhaler   ALPRAZolam (XANAX) 1 MG tablet   cyclobenzaprine (FLEXERIL) 10 MG tablet   diazepam (VALIUM) 10 MG tablet   diphenhydrAMINE (BENADRYL) 25 MG tablet   FLUoxetine (PROZAC) 40 MG capsule   gabapentin (NEURONTIN) 300 MG capsule   ibuprofen (ADVIL) 200 MG tablet   levETIRAcetam (KEPPRA) 500 MG tablet   levonorgestrel (MIRENA) 20 MCG/24HR IUD   naproxen (NAPROSYN) 500 MG tablet    oxyCODONE-acetaminophen (PERCOCET/ROXICET) 5-325 MG tablet   rosuvastatin (CRESTOR) 10 MG tablet   telmisartan-hydrochlorothiazide (MICARDIS HCT) 40-12.5 MG tablet   No current facility-administered medications for this encounter.     Jodell Cipro Ward, PA-C [facility] [phone]

## 2022-01-18 NOTE — ED Notes (Signed)
Patient taken to CT.

## 2022-01-18 NOTE — Assessment & Plan Note (Addendum)
Stable on RA  resume home albuterol inhaler

## 2022-01-18 NOTE — Assessment & Plan Note (Addendum)
Resume ARB Hold HCTZ due to hyponatremia

## 2022-01-18 NOTE — Assessment & Plan Note (Deleted)
Hyponatremia sodium 129.  Hypokalemia potassium 3.2.  Normal magnesium at 2.  Likely for poor oral intake over the past few days.  She is also on HCTZ. -Hydrate 2 L bolus given, continue N/s + 40 kcl 100cc/hr x 10hrs -Replete Electrolytes

## 2022-01-18 NOTE — ED Notes (Signed)
Patient back from CT.

## 2022-01-18 NOTE — Assessment & Plan Note (Addendum)
Seizures today secondary to noncompliance with antiseizure medications  Has not taken Keppra for a week Loaded with Keppra in ED -Teleneurology consulted, recommends resuming home antiseizure medications -Resume gabapentin 1200 4 times daily -Resume Xanax 1 mg 3 times daily Patient also last drank alcohol on 01/14/2022--she is lucid presently, but certainly may have had an alcohol withdrawal seizure No seizures during the hospitalization D/c home keppra 500 mg bid

## 2022-01-18 NOTE — Assessment & Plan Note (Addendum)
Resume home pain meds percocet and gabapentin.  She is scheduled for hip replacement 2/24 but this has been delayed due to her thrombocytopenia PDMP reviewed-received xanax 1mg  #90 monthly

## 2022-01-18 NOTE — Assessment & Plan Note (Addendum)
Due to Etoh Pt had normal platelets on 07/28/21 Check B12--1063 RBC Folate--1116 TSH--1.597 Trending back up at time of dc Repeat CBC one week after dc

## 2022-01-19 ENCOUNTER — Observation Stay (HOSPITAL_COMMUNITY): Payer: Commercial Managed Care - PPO

## 2022-01-19 ENCOUNTER — Encounter: Payer: Self-pay | Admitting: Orthopaedic Surgery

## 2022-01-19 DIAGNOSIS — R7401 Elevation of levels of liver transaminase levels: Secondary | ICD-10-CM

## 2022-01-19 DIAGNOSIS — E876 Hypokalemia: Secondary | ICD-10-CM

## 2022-01-19 DIAGNOSIS — E871 Hypo-osmolality and hyponatremia: Secondary | ICD-10-CM

## 2022-01-19 DIAGNOSIS — I10 Essential (primary) hypertension: Secondary | ICD-10-CM

## 2022-01-19 DIAGNOSIS — G40909 Epilepsy, unspecified, not intractable, without status epilepticus: Secondary | ICD-10-CM | POA: Diagnosis not present

## 2022-01-19 DIAGNOSIS — F1010 Alcohol abuse, uncomplicated: Secondary | ICD-10-CM | POA: Diagnosis not present

## 2022-01-19 LAB — BASIC METABOLIC PANEL
Anion gap: 9 (ref 5–15)
BUN: 9 mg/dL (ref 6–20)
CO2: 24 mmol/L (ref 22–32)
Calcium: 8.4 mg/dL — ABNORMAL LOW (ref 8.9–10.3)
Chloride: 102 mmol/L (ref 98–111)
Creatinine, Ser: 0.5 mg/dL (ref 0.44–1.00)
GFR, Estimated: >60 mL/min (ref >60)
Glucose, Bld: 79 mg/dL (ref 70–99)
Potassium: 3.4 mmol/L — ABNORMAL LOW (ref 3.5–5.1)
Sodium: 135 mmol/L (ref 135–145)

## 2022-01-19 LAB — HEPATIC FUNCTION PANEL
ALT: 148 U/L — ABNORMAL HIGH (ref 0–44)
AST: 204 U/L — ABNORMAL HIGH (ref 15–41)
Albumin: 2.9 g/dL — ABNORMAL LOW (ref 3.5–5.0)
Alkaline Phosphatase: 133 U/L — ABNORMAL HIGH (ref 38–126)
Bilirubin, Direct: 0.5 mg/dL — ABNORMAL HIGH (ref 0.0–0.2)
Indirect Bilirubin: 0.9 mg/dL (ref 0.3–0.9)
Total Bilirubin: 1.4 mg/dL — ABNORMAL HIGH (ref 0.3–1.2)
Total Protein: 6.4 g/dL — ABNORMAL LOW (ref 6.5–8.1)

## 2022-01-19 LAB — FOLATE: Folate: 10.3 ng/mL (ref >5.9)

## 2022-01-19 LAB — CBC
HCT: 39.8 % (ref 36.0–46.0)
Hemoglobin: 13.1 g/dL (ref 12.0–15.0)
MCH: 35.3 pg — ABNORMAL HIGH (ref 26.0–34.0)
MCHC: 32.9 g/dL (ref 30.0–36.0)
MCV: 107.3 fL — ABNORMAL HIGH (ref 80.0–100.0)
Platelets: 92 10*3/uL — ABNORMAL LOW (ref 150–400)
RBC: 3.71 MIL/uL — ABNORMAL LOW (ref 3.87–5.11)
RDW: 13.1 % (ref 11.5–15.5)
WBC: 3.4 10*3/uL — ABNORMAL LOW (ref 4.0–10.5)
nRBC: 0 % (ref 0.0–0.2)

## 2022-01-19 LAB — HEPATITIS PANEL, ACUTE
HCV Ab: NONREACTIVE
Hep A IgM: NONREACTIVE
Hep B C IgM: NONREACTIVE
Hepatitis B Surface Ag: NONREACTIVE

## 2022-01-19 LAB — MAGNESIUM: Magnesium: 2 mg/dL (ref 1.7–2.4)

## 2022-01-19 LAB — HIV ANTIBODY (ROUTINE TESTING W REFLEX): HIV Screen 4th Generation wRfx: NONREACTIVE

## 2022-01-19 LAB — OSMOLALITY: Osmolality: 282 mOsm/kg (ref 275–295)

## 2022-01-19 LAB — VITAMIN B12: Vitamin B-12: 1063 pg/mL — ABNORMAL HIGH (ref 180–914)

## 2022-01-19 LAB — TSH: TSH: 1.597 u[IU]/mL (ref 0.350–4.500)

## 2022-01-19 LAB — T4, FREE: Free T4: 1.11 ng/dL (ref 0.61–1.12)

## 2022-01-19 MED ORDER — ADULT MULTIVITAMIN W/MINERALS CH
1.0000 | ORAL_TABLET | Freq: Every day | ORAL | Status: DC
  Administered 2022-01-19 – 2022-01-20 (×2): 1 via ORAL
  Filled 2022-01-19 (×2): qty 1

## 2022-01-19 MED ORDER — PANTOPRAZOLE SODIUM 40 MG PO TBEC
40.0000 mg | DELAYED_RELEASE_TABLET | Freq: Every day | ORAL | Status: DC
  Administered 2022-01-19 – 2022-01-20 (×2): 40 mg via ORAL
  Filled 2022-01-19 (×2): qty 1

## 2022-01-19 MED ORDER — ENSURE ENLIVE PO LIQD
237.0000 mL | Freq: Two times a day (BID) | ORAL | Status: DC
  Administered 2022-01-19: 237 mL via ORAL

## 2022-01-19 MED ORDER — POTASSIUM CHLORIDE CRYS ER 20 MEQ PO TBCR
20.0000 meq | EXTENDED_RELEASE_TABLET | Freq: Once | ORAL | Status: AC
  Administered 2022-01-19: 20 meq via ORAL
  Filled 2022-01-19: qty 1

## 2022-01-19 MED ORDER — POTASSIUM CHLORIDE IN NACL 20-0.9 MEQ/L-% IV SOLN: INTRAVENOUS | Status: DC

## 2022-01-19 NOTE — Assessment & Plan Note (Addendum)
Due to Etoh and poor solute intake -improving with NS Continue NS -Na 134 at time of d/c

## 2022-01-19 NOTE — Assessment & Plan Note (Addendum)
RUQ ultrasound--steatosis Viral hepatitis panel--neg Hold crestor until normalized Likely due to Etoh Trend down at time of dc Please repeat LFTs in one week after d/c

## 2022-01-19 NOTE — Assessment & Plan Note (Signed)
Replete Mag 2.0

## 2022-01-19 NOTE — Progress Notes (Addendum)
PROGRESS NOTE  Kelsey [name] [id number] DOB: 09-12-1981 DOA: 01/18/2022 PCP: Lindell Spar, MD  Brief History:  41 year old female with history of seizure disorder, hypertension, depression, anxiety, AVN of the right hip, alcohol abuse, asthma presenting after syncopal episode.  Apparently, the patient was taking her dog for a walk, and the next thing she remembered was waking up in the ambulance.  Apparently, the patient was found by a neighbor lying in the yard and EMS was activated.  The patient had been incontinent of urine.  She states that she quit taking her Keppra about 1 week prior to this admission.  She stated that she was trying to reduce the number of medications she was taking.  Prior to that, she stated that her last seizure was about 2 to 3 months prior to this admission.  The patient does not recall who initially prescribed her Keppra.  She states that there have been no recent dosing changes.  She states that she has been taking her Xanax and has not been missing any doses.  Initially, she stated that she last drank alcohol 1 month prior to this admission.  However she revealed to me that she last drank alcohol on 01/14/2022.  The patient had been drinking 4 shots of vodka on a daily basis.  She has been using THC.  She denies any other illicit drugs.  She denies any supplements over-the-counter, but she states that she has been taking approximately 4 ibuprofen and acetaminophen on a daily basis.  She smokes 3 cigarettes/day. In the ED, the patient was afebrile hemodynamically stable with oxygen saturation 97% room air.  She was noted to have sodium 129, potassium 2.9, serum creatinine 0.61, bicarbonate 19.  AST <5, ALT 216, alkaline phosphatase 176, trouble over 1.9 WBC 4.4, hemoglobin 16.7, and platelets 99,000.  The patient had a tonic-clonic seizure episode in the ED lasting 1 to 2 minutes.  She was loaded with Keppra.     Assessment and Plan: * Seizure disorder  ([place]) Seizures today secondary to noncompliance with antiseizure medications  Has not taken Keppra for a week Loaded with Keppra in ED -Teleneurology consulted, recommends resuming home antiseizure medications -Resume gabapentin 1200 4 times daily -Resume Xanax 1 mg 3 times daily Patient also last drank alcohol on 01/14/2022--she is lucid presently, but certainly may have had an alcohol withdrawal seizure No seizures overnight  Transaminasemia RUQ ultrasound Viral hepatitis panel pending Hold crestor Likely due to Etoh  Hypokalemia Replete Mag 2.0  Hyponatremia Due to Etoh and poor solute intake -improving with NS Continue NS  Thrombocytopenia ([place])- (present on admission) Due to Etoh Check B12 Folate TSH  Essential hypertension- (present on admission) Resume ARB Hold HCTZ due to hyponatremia  Chronic hip pain- (present on admission) Resume home pain meds percocet and gabapentin.  She is scheduled for hip replacement 2/24 but this has been delayed due to her thrombocytopenia PDMP reviewed-received xanax 1mg  #90 monthly  Asthma- (present on admission) Stable on RA  resume home albuterol inhaler  GAD (generalized anxiety disorder)- (present on admission) Resume Prozac, Xanax 1 mg 3 times daily.  Alcohol abuse- (present on admission) Last Etoh 01/14/22 AST>ALT on liver enzymes -CIWA -Thiamine folate -Trend liver enzymes -Acute hepatitis panel         Status is: Observation The patient remains OBS appropriate and will d/c before 2 midnights.          Family Communication:  no  Family at bedside  Consultants:  none  Code Status:  FULL   DVT Prophylaxis:  SCDs   Procedures: As Listed in Progress Note Above  Antibiotics: None       Subjective: Pt complains of nausea.  Patient denies fevers, chills, headache, chest pain, dyspnea, nausea, vomiting, diarrhea, abdominal pain, dysuria, hematuria, hematochezia, and  melena.    Objective: Vitals:   01/18/22 1716 01/18/22 1736 01/19/22 0219 01/19/22 0503  BP: (!) 123/95 124/86 (!) 136/94 (!) 138/102  Pulse: 96 94 96 99  Resp: (!) 26 16 20 18   Temp: 98 F (36.7 C) 98.3 F (36.8 C) 98 F (36.7 C) 98 F (36.7 C)  TempSrc: Oral Oral    SpO2: 100% 98% 98% 97%  Weight:  71.1 kg    Height:  5\' 4"  (1.626 m)      Intake/Output Summary (Last 24 hours) at 01/19/2022 0729 Last data filed at 01/19/2022 0640 Gross per 24 hour  Intake 35.21 ml  Output 500 ml  Net -464.79 ml   Weight change:  Exam:  General:  Pt is alert, follows commands appropriately, not in acute distress HEENT: No icterus, No thrush, No neck mass, [place]/AT Cardiovascular: RRR, S1/S2, no rubs, no gallops Respiratory: CTA bilaterally, no wheezing, no crackles, no rhonchi Abdomen: Soft/+BS, non tender, non distended, no guarding Extremities: No edema, No lymphangitis, No petechiae, No rashes, no synovitis   Data Reviewed: I have personally reviewed following labs and imaging studies Basic Metabolic Panel: Recent Labs  Lab 01/17/22 1143 01/18/22 1251 01/19/22 0529 01/19/22 0633  NA 132* 129* 135  --   K 2.9* 3.2* 3.4*  --   CL 91* 90* 102  --   CO2 25 19* 24  --   GLUCOSE 147* 227* 79  --   BUN 19 15 9   --   CREATININE 0.61 0.68 0.50  --   CALCIUM 9.5 9.4 8.4*  --   MG  --  2.0  --  2.0   Liver Function Tests: Recent Labs  Lab 01/18/22 1251 01/19/22 0633  AST <5* 204*  ALT 216* 148*  ALKPHOS 176* 133*  BILITOT 1.9* 1.4*  PROT 8.3* 6.4*  ALBUMIN 3.9 2.9*   No results for input(s): LIPASE, AMYLASE in the last 168 hours. No results for input(s): AMMONIA in the last 168 hours. Coagulation Profile: No results for input(s): INR, PROTIME in the last 168 hours. CBC: Recent Labs  Lab 01/17/22 1143 01/18/22 1251 01/19/22 0529  WBC 3.5* 4.4 3.4*  NEUTROABS  --  3.3  --   HGB 16.3* 16.7* 13.1  HCT 47.2* 47.3* 39.8  MCV 99.8 101.3* 107.3*  PLT 85* 99* 92*    Cardiac Enzymes: Recent Labs  Lab 01/18/22 1251  CKTOTAL 40   BNP: Invalid input(s): POCBNP CBG: Recent Labs  Lab 01/18/22 1236  GLUCAP 241*   HbA1C: No results for input(s): HGBA1C in the last 72 hours. Urine analysis:    Component Value Date/Time   COLORURINE YELLOW 01/18/2022 [place] 01/18/2022 1253   LABSPEC 1.013 01/18/2022 1253   PHURINE 6.0 01/18/2022 1253   GLUCOSEU NEGATIVE 01/18/2022 1253   HGBUR MODERATE (A) 01/18/2022 1253   BILIRUBINUR NEGATIVE 01/18/2022 1253   KETONESUR 20 (A) 01/18/2022 1253   PROTEINUR 30 (A) 01/18/2022 1253   UROBILINOGEN 0.2 01/17/2015 0851   NITRITE NEGATIVE 01/18/2022 1253   LEUKOCYTESUR NEGATIVE 01/18/2022 1253   Sepsis Labs: @LABRCNTIP (procalcitonin:4,lacticidven:4) ) Recent Results (from the past 240 hour(s))  Surgical PCR Screen     Status: Abnormal   Collection Time: 01/17/22 11:43 AM   Specimen: Nasal Mucosa; Nasal Swab  Result Value Ref Range Status   MRSA, PCR NEGATIVE NEGATIVE Final   Staphylococcus aureus POSITIVE (A) NEGATIVE Final    Comment: (NOTE) The Xpert SA Assay (FDA approved for NASAL specimens in patients 48 years of age and older), is one component of a comprehensive surveillance program. It is not intended to diagnose infection nor to guide or monitor treatment. Performed at [facility], [place] [address], [place], [place] [address]   SARS CORONAVIRUS 2 ([place] 6-24 HRS) Nasopharyngeal Nasopharyngeal Swab     Status: None   Collection Time: 01/17/22 11:43 AM   Specimen: Nasopharyngeal Swab  Result Value Ref Range Status   SARS Coronavirus 2 NEGATIVE NEGATIVE Final    Comment: (NOTE) SARS-CoV-2 target nucleic acids are NOT DETECTED.  The SARS-CoV-2 RNA is generally detectable in upper and lower respiratory specimens during the acute phase of infection. Negative results do not preclude SARS-CoV-2 infection, do not rule out co-infections with other pathogens, and  should not be used as the sole basis for treatment or other patient management decisions. Negative results must be combined with clinical observations, patient history, and epidemiological information. The expected result is Negative.  Fact Sheet for Patients: SugarRoll.be  Fact Sheet for Healthcare Providers: https://www.woods-mathews.com/  This test is not yet approved or cleared by the [place] FDA and  has been authorized for detection and/or diagnosis of SARS-CoV-2 by FDA under an Emergency Use Authorization (EUA). This EUA will remain  in effect (meaning this test can be used) for the duration of the COVID-19 declaration under Se ction 564(b)(1) of the Act, 21 U.S.C. section 360bbb-3(b)(1), unless the authorization is terminated or revoked sooner.  Performed at [facility], [place] [address], [place],  [address]      Scheduled Meds:  ALPRAZolam  1 mg Oral TID   FLUoxetine  80 mg Oral Daily   folic acid  1 mg Oral Daily   gabapentin  1,200 mg Oral QID   irbesartan  150 mg Oral Daily   levETIRAcetam  500 mg Oral BID   thiamine  100 mg Oral Daily   Continuous Infusions:  Procedures/Studies: CT Head Wo Contrast  Result Date: 01/18/2022 CLINICAL DATA:  Seizure, found down. EXAM: CT HEAD WITHOUT CONTRAST CT CERVICAL SPINE WITHOUT CONTRAST TECHNIQUE: Multidetector CT imaging of the head and cervical spine was performed following the standard protocol without intravenous contrast. Multiplanar CT image reconstructions of the cervical spine were also generated. RADIATION DOSE REDUCTION: This exam was performed according to the departmental dose-optimization program which includes automated exposure control, adjustment of the mA and/or kV according to patient size and/or use of iterative reconstruction technique. COMPARISON:  CT head dated 07/14/2021 and cervical spine radiographs dated 07/01/2016. FINDINGS: CT HEAD FINDINGS  Brain: No evidence of acute infarction, hemorrhage, hydrocephalus, extra-axial collection or mass lesion/mass effect. Vascular: No hyperdense vessel or unexpected calcification. Skull: Normal. Negative for fracture or focal lesion. Sinuses/Orbits: No acute finding. Other: None. CT CERVICAL SPINE FINDINGS Alignment: Normal. Skull base and vertebrae: No acute fracture. No primary bone lesion or focal pathologic process. Soft tissues and spinal canal: No prevertebral fluid or swelling. No visible canal hematoma. Disc levels:  Preserved. Upper chest: Negative. Other: None. IMPRESSION: 1. No acute intracranial process. 2. No acute osseous injury in the cervical spine. Electronically Signed   By: Harley Hallmark.D.  On: 01/18/2022 14:13   CT Cervical Spine Wo Contrast  Result Date: 01/18/2022 CLINICAL DATA:  Seizure, found down. EXAM: CT HEAD WITHOUT CONTRAST CT CERVICAL SPINE WITHOUT CONTRAST TECHNIQUE: Multidetector CT imaging of the head and cervical spine was performed following the standard protocol without intravenous contrast. Multiplanar CT image reconstructions of the cervical spine were also generated. RADIATION DOSE REDUCTION: This exam was performed according to the departmental dose-optimization program which includes automated exposure control, adjustment of the mA and/or kV according to patient size and/or use of iterative reconstruction technique. COMPARISON:  CT head dated 07/14/2021 and cervical spine radiographs dated 07/01/2016. FINDINGS: CT HEAD FINDINGS Brain: No evidence of acute infarction, hemorrhage, hydrocephalus, extra-axial collection or mass lesion/mass effect. Vascular: No hyperdense vessel or unexpected calcification. Skull: Normal. Negative for fracture or focal lesion. Sinuses/Orbits: No acute finding. Other: None. CT CERVICAL SPINE FINDINGS Alignment: Normal. Skull base and vertebrae: No acute fracture. No primary bone lesion or focal pathologic process. Soft tissues and spinal  canal: No prevertebral fluid or swelling. No visible canal hematoma. Disc levels:  Preserved. Upper chest: Negative. Other: None. IMPRESSION: 1. No acute intracranial process. 2. No acute osseous injury in the cervical spine. Electronically Signed   By: Zerita Boers M.D.   On: 01/18/2022 14:13    Orson Eva, DO  [facility]  If 7PM-7AM, please contact night-coverage [email or web address] Password [email or web address] 01/19/2022, 7:29 AM   LOS: 0 days

## 2022-01-19 NOTE — Progress Notes (Signed)
Initial Nutrition Assessment  DOCUMENTATION CODES:   Not applicable  INTERVENTION:  - Encourage adequate PO intake - Ensure Enlive po BID, each supplement provides 350 kcal and 20 grams of protein. (Pt prefers chocolate) - MVI with minerals daily  NUTRITION DIAGNOSIS:   Inadequate oral intake related to poor appetite as evidenced by per patient/family report.  GOAL:   Patient will meet greater than or equal to 90% of their needs  MONITOR:   PO intake, Supplement acceptance, Labs, Weight trends  REASON FOR ASSESSMENT:   Malnutrition Screening Tool    ASSESSMENT:   Pt admitted from home d/t seizure secondary to medication noncompliance. PMH includes seizure disorder, HTN, depression, chronic hip pain from avascular necrosis, asthma, alcohol abuse.  Pt reports that she typically has only been eating 1 meal a day at dinner time which her husband usually prepares but she sometimes will only eat a few bites as she has not had an appetite. Within the last week she reports eating very minimally d/t pain with her hip of which she was preparing for surgery tomorrow which is now postponed d/t hospitalization. She also reports that her dog was hit by a car which has also attributed to her loss in appetite. Pt endorses nausea, reached out to RN about this at pt's request.  Per review of chart, noted pt with history of THC and alcohol use, 4 shots of vodka daily with last intake 2/18. Pt only reports intake of water with lemon daily.  Pt reports a usual weight 160-170 lbs and endorses a 10 lbs weight loss. She would like to lose weight but "wants to do it the healthy way, not like this." Per review of chart, pt with slow but steady weight loss since September 2022. 7% weight loss within the last 5 months.   Suspected a degree of malnutrition present given poor PO intake, however unable to confirm at this time. Will continue to reassess as able.   Medications: folic acid, protonix,  thiamine  Labs: potassium 3.4, alkaline phosphatase 133, AST 204, ALT 148, folate 10.3 (WNL), Vitamin B12 1063 (H)   NUTRITION - FOCUSED PHYSICAL EXAM: RD working remotely- deferred to follow up  Diet Order:   Diet Order             Diet regular Room service appropriate? Yes; Fluid consistency: Thin  Diet effective now                   EDUCATION NEEDS:   Education needs have been addressed  Skin:  Skin Assessment: Reviewed RN Assessment  Last BM:  2/21  Height:   Ht Readings from Last 1 Encounters:  01/18/22 5\' 4"  (1.626 m)    Weight:   Wt Readings from Last 1 Encounters:  01/18/22 71.1 kg    BMI:  Body mass index is 26.91 kg/m.  Estimated Nutritional Needs:   Kcal:  1900-2100  Protein:  95-110g  Fluid:  >/=1.9L  01/20/22, RDN, LDN Clinical Nutrition

## 2022-01-19 NOTE — Hospital Course (Signed)
41 year old female with history of seizure disorder, hypertension, depression, anxiety, AVN of the right hip, alcohol abuse, asthma presenting after syncopal episode.  Apparently, the patient was taking her dog for a walk, and the next thing she remembered was waking up in the ambulance.  Apparently, the patient was found by a neighbor lying in the yard and EMS was activated.  The patient had been incontinent of urine.  She states that she quit taking her Keppra about 1 week prior to this admission.  She stated that she was trying to reduce the number of medications she was taking.  Prior to that, she stated that her last seizure was about 2 to 3 months prior to this admission.  The patient does not recall who initially prescribed her Keppra.  She states that there have been no recent dosing changes.  She states that she has been taking her Xanax and has not been missing any doses.  Initially, she stated that she last drank alcohol 1 month prior to this admission.  However she revealed to me that she last drank alcohol on 01/14/2022.  The patient had been drinking 4 shots of vodka on a daily basis.  She has been using THC.  She denies any other illicit drugs.  She denies any supplements over-the-counter, but she states that she has been taking approximately 4 ibuprofen and acetaminophen on a daily basis.  She smokes 3 cigarettes/day. In the ED, the patient was afebrile hemodynamically stable with oxygen saturation 97% room air.  She was noted to have sodium 129, potassium 2.9, serum creatinine 0.61, bicarbonate 19.  AST <5, ALT 216, alkaline phosphatase 176, trouble over 1.9 WBC 4.4, hemoglobin 16.7, and platelets 99,000.  The patient had a tonic-clonic seizure episode in the ED lasting 1 to 2 minutes.  She was loaded with Keppra.

## 2022-01-19 NOTE — Discharge Instructions (Signed)
Nutrition Post Hospital Stay Proper nutrition can help your body recover from illness and injury.   Foods and beverages high in protein, vitamins, and minerals help rebuild muscle loss, promote healing, & reduce fall risk.   In addition to eating healthy foods, a nutrition shake is an easy, delicious way to get the nutrition you need during and after your hospital stay  It is recommended that you continue to drink 2 bottles per day of:  Ensure Plus/ Boost Plus/ Carnation Instant Breakfast for at least 1 month (30 days) after your hospital stay   Tips for adding a nutrition shake into your routine: As allowed, drink one with vitamins or medications instead of water or juice Enjoy one as a tasty mid-morning or afternoon snack Drink cold or make a milkshake out of it Drink one instead of milk with cereal or snacks Use as a coffee creamer   Available at the following grocery stores and pharmacies:           * Karin Golden * [employer] * [employer]  * R[employer] Aid          * Walmart * [employer]  * Wal[facility]reens      * Target  * BJ's   * CVS  * [employer]   * [facility] [phone]            For COUPONS visit: www.[email or web address]/join or [email or web address]   Suggested Substitutions Ensure Plus = Boost Plus = Carnation Breakfast Essentials = Boost Compact Ensure Active Clear = Boost Breeze Glucerna Shake = Boost Glucose Control = Carnation Breakfast Essentials SUGAR FREE

## 2022-01-20 ENCOUNTER — Encounter (HOSPITAL_COMMUNITY): Admission: RE | Payer: Self-pay | Source: Home / Self Care

## 2022-01-20 ENCOUNTER — Ambulatory Visit: Payer: Commercial Managed Care - PPO | Admitting: Internal Medicine

## 2022-01-20 ENCOUNTER — Ambulatory Visit (HOSPITAL_COMMUNITY)
Admission: RE | Admit: 2022-01-20 | Payer: Commercial Managed Care - PPO | Source: Home / Self Care | Admitting: Orthopaedic Surgery

## 2022-01-20 DIAGNOSIS — G40909 Epilepsy, unspecified, not intractable, without status epilepticus: Secondary | ICD-10-CM | POA: Diagnosis not present

## 2022-01-20 DIAGNOSIS — I10 Essential (primary) hypertension: Secondary | ICD-10-CM | POA: Diagnosis not present

## 2022-01-20 DIAGNOSIS — E876 Hypokalemia: Secondary | ICD-10-CM | POA: Diagnosis not present

## 2022-01-20 DIAGNOSIS — F1010 Alcohol abuse, uncomplicated: Secondary | ICD-10-CM | POA: Diagnosis not present

## 2022-01-20 LAB — FOLATE RBC
Folate, Hemolysate: 422 ng/mL
Folate, RBC: 1116 ng/mL (ref >498)
Hematocrit: 37.8 % (ref 34.0–46.6)

## 2022-01-20 LAB — COMPREHENSIVE METABOLIC PANEL
ALT: 115 U/L — ABNORMAL HIGH (ref 0–44)
AST: 135 U/L — ABNORMAL HIGH (ref 15–41)
Albumin: 2.7 g/dL — ABNORMAL LOW (ref 3.5–5.0)
Alkaline Phosphatase: 126 U/L (ref 38–126)
Anion gap: 7 (ref 5–15)
BUN: <5 mg/dL — ABNORMAL LOW (ref 6–20)
CO2: 24 mmol/L (ref 22–32)
Calcium: 8.1 mg/dL — ABNORMAL LOW (ref 8.9–10.3)
Chloride: 103 mmol/L (ref 98–111)
Creatinine, Ser: 0.52 mg/dL (ref 0.44–1.00)
GFR, Estimated: >60 mL/min (ref >60)
Glucose, Bld: 85 mg/dL (ref 70–99)
Potassium: 3.6 mmol/L (ref 3.5–5.1)
Sodium: 134 mmol/L — ABNORMAL LOW (ref 135–145)
Total Bilirubin: 1.1 mg/dL (ref 0.3–1.2)
Total Protein: 5.9 g/dL — ABNORMAL LOW (ref 6.5–8.1)

## 2022-01-20 LAB — CBC
HCT: 38.8 % (ref 36.0–46.0)
Hemoglobin: 11.9 g/dL — ABNORMAL LOW (ref 12.0–15.0)
MCH: 33.7 pg (ref 26.0–34.0)
MCHC: 30.7 g/dL (ref 30.0–36.0)
MCV: 109.9 fL — ABNORMAL HIGH (ref 80.0–100.0)
Platelets: 113 10*3/uL — ABNORMAL LOW (ref 150–400)
RBC: 3.53 MIL/uL — ABNORMAL LOW (ref 3.87–5.11)
RDW: 13.2 % (ref 11.5–15.5)
WBC: 3.2 10*3/uL — ABNORMAL LOW (ref 4.0–10.5)
nRBC: 0 % (ref 0.0–0.2)

## 2022-01-20 LAB — HEMOGLOBIN A1C
Hgb A1c MFr Bld: 5 % (ref 4.8–5.6)
Mean Plasma Glucose: 97 mg/dL

## 2022-01-20 LAB — TYPE AND SCREEN
ABO/RH(D): O POS
Antibody Screen: NEGATIVE

## 2022-01-20 LAB — MAGNESIUM: Magnesium: 1.7 mg/dL (ref 1.7–2.4)

## 2022-01-20 SURGERY — ARTHROPLASTY, HIP, TOTAL, ANTERIOR APPROACH
Anesthesia: Spinal | Site: Hip | Laterality: Left

## 2022-01-20 MED ORDER — LEVETIRACETAM 500 MG PO TABS: 500.0000 mg | ORAL_TABLET | Freq: Two times a day (BID) | ORAL | 1 refills | Status: DC

## 2022-01-20 NOTE — Progress Notes (Signed)
°  Transition of Care [facility]) Screening Note   Patient Details  Name: Kelsey [name] Date of Birth: 11/26/1981   [facility] CM/SW Contact:    Iona Beard, Albert Lea Phone Number: 01/20/2022, 10:23 AM    Transition of Care Department [facility]) has reviewed patient and no TOC needs have been identified at this time. We will continue to monitor patient advancement through interdisciplinary progression rounds. If new patient transition needs arise, please place a TOC consult.

## 2022-01-20 NOTE — Discharge Summary (Signed)
Physician Discharge Summary   Patient: Kelsey [name] MRN: [id number] DOB: 11/23/1981  Admit date:     01/18/2022  Discharge date: 01/20/22  Discharge Physician: Onalee Huaavid Laranda Burkemper   PCP: Anabel HalonPatel, Rutwik K, MD   Recommendations at discharge:   Please follow up with primary care provider within 1-2 weeks  Please repeat LFTs and CBC in one week       Hospital Course: 41 year old female with history of seizure disorder, hypertension, depression, anxiety, AVN of the right hip, alcohol abuse, asthma presenting after syncopal episode.  Apparently, the patient was taking her dog for a walk, and the next thing she remembered was waking up in the ambulance.  Apparently, the patient was found by a neighbor lying in the yard and EMS was activated.  The patient had been incontinent of urine.  She states that she quit taking her Keppra about 1 week prior to this admission.  She stated that she was trying to reduce the number of medications she was taking.  Prior to that, she stated that her last seizure was about 2 to 3 months prior to this admission.  The patient does not recall who initially prescribed her Keppra.  She states that there have been no recent dosing changes.  She states that she has been taking her Xanax and has not been missing any doses.  Initially, she stated that she last drank alcohol 1 month prior to this admission.  However she revealed to me that she last drank alcohol on 01/14/2022.  The patient had been drinking 4 shots of vodka on a daily basis.  She has been using THC.  She denies any other illicit drugs.  She denies any supplements over-the-counter, but she states that she has been taking approximately 4 ibuprofen and acetaminophen on a daily basis.  She smokes 3 cigarettes/day. In the ED, the patient was afebrile hemodynamically stable with oxygen saturation 97% room air.  She was noted to have sodium 129, potassium 2.9, serum creatinine 0.61, bicarbonate 19.  AST <5, ALT 216, alkaline  phosphatase 176, trouble over 1.9 WBC 4.4, hemoglobin 16.7, and platelets 99,000.  The patient had a tonic-clonic seizure episode in the ED lasting 1 to 2 minutes.  She was loaded with Keppra.  Assessment and Plan: * Seizure disorder (HCC) Seizures today secondary to noncompliance with antiseizure medications  Has not taken Keppra for a week Loaded with Keppra in ED -Teleneurology consulted, recommends resuming home antiseizure medications -Resume gabapentin 1200 4 times daily -Resume Xanax 1 mg 3 times daily Patient also last drank alcohol on 01/14/2022--she is lucid presently, but certainly may have had an alcohol withdrawal seizure No seizures during the hospitalization D/c home keppra 500 mg bid  Hyperbilirubinemia Cholestatic picture -improved at time of d/c  Transaminasemia RUQ ultrasound--steatosis Viral hepatitis panel--neg Hold crestor until normalized Likely due to Etoh Trend down at time of dc Please repeat LFTs in one week after d/c  Hypokalemia Replete Mag 2.0  Hyponatremia Due to Etoh and poor solute intake -improving with NS Continue NS -Na 134 at time of d/c  Thrombocytopenia (HCC)- (present on admission) Due to Etoh Pt had normal platelets on 07/28/21 Check B12--1063 RBC Folate--1116 TSH--1.597 Trending back up at time of dc Repeat CBC one week after dc  Essential hypertension- (present on admission) Resume ARB Hold HCTZ due to hyponatremia  Chronic hip pain- (present on admission) Resume home pain meds percocet and gabapentin.  She is scheduled for hip replacement 2/24 but this has been  delayed due to her thrombocytopenia PDMP reviewed-received xanax 1mg  #90 monthly  Asthma- (present on admission) Stable on RA  resume home albuterol inhaler  GAD (generalized anxiety disorder)- (present on admission) Resume Prozac, Xanax 1 mg 3 times daily.  Alcohol abuse- (present on admission) Last Etoh 01/14/22 AST>ALT on liver enzymes -CIWA>>no signs of  withdrawal during hospitalization -Thiamine folate -Trend liver enzymes -Acute hepatitis panel--neg           Consultants: none Procedures performed: none  Disposition: Home Diet recommendation:  Cardiac diet  DISCHARGE MEDICATION: Allergies as of 01/20/2022       Reactions   Penicillins Rash   Has patient had a PCN reaction causing immediate rash, facial/tongue/throat swelling, SOB or lightheadedness with hypotension: No Has patient had a PCN reaction causing severe rash involving mucus membranes or skin necrosis: No Has patient had a PCN reaction that required hospitalization No Has patient had a PCN reaction occurring within the last 10 years: No If all of the above answers are "NO", then may proceed with Cephalosporin use.        Medication List     STOP taking these medications    diazepam 10 MG tablet Commonly known as: VALIUM   rosuvastatin 10 MG tablet Commonly known as: Crestor       TAKE these medications    albuterol 108 (90 Base) MCG/ACT inhaler Commonly known as: VENTOLIN HFA Inhale 1-2 puffs into the lungs every 6 (six) hours as needed for wheezing or shortness of breath.   ALPRAZolam 1 MG tablet Commonly known as: XANAX Take 1 tablet (1 mg total) by mouth at bedtime as needed for anxiety. What changed: when to take this   cyclobenzaprine 10 MG tablet Commonly known as: FLEXERIL Take 1 tablet (10 mg total) by mouth every 12 (twelve) hours as needed for muscle spasms.   diphenhydrAMINE 25 MG tablet Commonly known as: BENADRYL Take 25 mg by mouth every 6 (six) hours as needed for allergies.   FLUoxetine 40 MG capsule Commonly known as: PROZAC Take 80 mg by mouth daily.   gabapentin 300 MG capsule Commonly known as: NEURONTIN TAKE 2 CAPSULES (600 MG TOTAL) BY MOUTH 3 (THREE) TIMES DAILY. What changed:  how much to take when to take this   ibuprofen 200 MG tablet Commonly known as: ADVIL Take 400 mg by mouth every 6 (six) hours  as needed for moderate pain or headache.   levETIRAcetam 500 MG tablet Commonly known as: Keppra Take 1 tablet (500 mg total) by mouth 2 (two) times daily. What changed: when to take this   levonorgestrel 20 MCG/24HR IUD Commonly known as: MIRENA 1 Intra Uterine Device (1 each total) by Intrauterine route once.   naproxen 500 MG tablet Commonly known as: NAPROSYN Take 500 mg by mouth 2 (two) times daily as needed for moderate pain.   oxyCODONE-acetaminophen 5-325 MG tablet Commonly known as: PERCOCET/ROXICET Take 1 tablet by mouth every 6 (six) hours as needed for severe pain.   telmisartan-hydrochlorothiazide 40-12.5 MG tablet Commonly known as: MICARDIS HCT TAKE 1 TABLET BY MOUTH EVERY DAY         Discharge Exam: Filed Weights   01/18/22 1227 01/18/22 1736  Weight: 71.2 kg 71.1 kg   HEENT:  [place]/AT, No thrush, no icterus CV:  RRR, no rub, no S3, no S4 Lung:  CTA, no wheeze, no rhonchi Abd:  soft/+BS, NT Ext:  No edema, no lymphangitis, no synovitis, no rash   Condition at discharge: stable  The results of significant diagnostics from this hospitalization (including imaging, microbiology, ancillary and laboratory) are listed below for reference.   Imaging Studies: CT Head Wo Contrast  Result Date: 01/18/2022 CLINICAL DATA:  Seizure, found down. EXAM: CT HEAD WITHOUT CONTRAST CT CERVICAL SPINE WITHOUT CONTRAST TECHNIQUE: Multidetector CT imaging of the head and cervical spine was performed following the standard protocol without intravenous contrast. Multiplanar CT image reconstructions of the cervical spine were also generated. RADIATION DOSE REDUCTION: This exam was performed according to the departmental dose-optimization program which includes automated exposure control, adjustment of the mA and/or kV according to patient size and/or use of iterative reconstruction technique. COMPARISON:  CT head dated 07/14/2021 and cervical spine radiographs dated 07/01/2016.  FINDINGS: CT HEAD FINDINGS Brain: No evidence of acute infarction, hemorrhage, hydrocephalus, extra-axial collection or mass lesion/mass effect. Vascular: No hyperdense vessel or unexpected calcification. Skull: Normal. Negative for fracture or focal lesion. Sinuses/Orbits: No acute finding. Other: None. CT CERVICAL SPINE FINDINGS Alignment: Normal. Skull base and vertebrae: No acute fracture. No primary bone lesion or focal pathologic process. Soft tissues and spinal canal: No prevertebral fluid or swelling. No visible canal hematoma. Disc levels:  Preserved. Upper chest: Negative. Other: None. IMPRESSION: 1. No acute intracranial process. 2. No acute osseous injury in the cervical spine. Electronically Signed   By: Romona Curls M.D.   On: 01/18/2022 14:13   CT Cervical Spine Wo Contrast  Result Date: 01/18/2022 CLINICAL DATA:  Seizure, found down. EXAM: CT HEAD WITHOUT CONTRAST CT CERVICAL SPINE WITHOUT CONTRAST TECHNIQUE: Multidetector CT imaging of the head and cervical spine was performed following the standard protocol without intravenous contrast. Multiplanar CT image reconstructions of the cervical spine were also generated. RADIATION DOSE REDUCTION: This exam was performed according to the departmental dose-optimization program which includes automated exposure control, adjustment of the mA and/or kV according to patient size and/or use of iterative reconstruction technique. COMPARISON:  CT head dated 07/14/2021 and cervical spine radiographs dated 07/01/2016. FINDINGS: CT HEAD FINDINGS Brain: No evidence of acute infarction, hemorrhage, hydrocephalus, extra-axial collection or mass lesion/mass effect. Vascular: No hyperdense vessel or unexpected calcification. Skull: Normal. Negative for fracture or focal lesion. Sinuses/Orbits: No acute finding. Other: None. CT CERVICAL SPINE FINDINGS Alignment: Normal. Skull base and vertebrae: No acute fracture. No primary bone lesion or focal pathologic process.  Soft tissues and spinal canal: No prevertebral fluid or swelling. No visible canal hematoma. Disc levels:  Preserved. Upper chest: Negative. Other: None. IMPRESSION: 1. No acute intracranial process. 2. No acute osseous injury in the cervical spine. Electronically Signed   By: Romona Curls M.D.   On: 01/18/2022 14:13   US Abdomen Limited RUQ (LIVER/GB)  Result Date: 01/19/2022 CLINICAL DATA:  Transaminitis, hyperbilirubinemia EXAM: ULTRASOUND ABDOMEN LIMITED RIGHT UPPER QUADRANT COMPARISON:  None. FINDINGS: Gallbladder: The gallbladder is mildly distended. There are no visible stones. No wall thickening or pericholecystic fluid. Negative sonographic Murphy sign. Common bile duct: Diameter: 3.1 mm.  No intrahepatic biliary ductal dilation. Liver: Diffusely increased liver echogenicity. Portal vein is patent on color Doppler imaging with normal direction of blood flow towards the liver. Other: None. IMPRESSION: Hepatic steatosis. No evidence of biliary obstruction. Electronically Signed   By: Caprice Renshaw M.D.   On: 01/19/2022 09:04    Microbiology: Results for orders placed or performed during the hospital encounter of 01/17/22  Surgical PCR Screen     Status: Abnormal   Collection Time: 01/17/22 11:43 AM   Specimen: Nasal Mucosa; Nasal Swab  Result  Value Ref Range Status   MRSA, PCR NEGATIVE NEGATIVE Final   Staphylococcus aureus POSITIVE (A) NEGATIVE Final    Comment: (NOTE) The Xpert SA Assay (FDA approved for NASAL specimens in patients 51 years of age and older), is one component of a comprehensive surveillance program. It is not intended to diagnose infection nor to guide or monitor treatment. Performed at [facility], [address] [place], [place] [address]   SARS CORONAVIRUS 2 ([place] 6-24 HRS) Nasopharyngeal Nasopharyngeal Swab     Status: None   Collection Time: 01/17/22 11:43 AM   Specimen: Nasopharyngeal Swab  Result Value Ref Range Status   SARS Coronavirus 2  NEGATIVE NEGATIVE Final    Comment: (NOTE) SARS-CoV-2 target nucleic acids are NOT DETECTED.  The SARS-CoV-2 RNA is generally detectable in upper and lower respiratory specimens during the acute phase of infection. Negative results do not preclude SARS-CoV-2 infection, do not rule out co-infections with other pathogens, and should not be used as the sole basis for treatment or other patient management decisions. Negative results must be combined with clinical observations, patient history, and epidemiological information. The expected result is Negative.  Fact Sheet for Patients: HairSlick.no  Fact Sheet for Healthcare Providers: quierodirigir.com  This test is not yet approved or cleared by the [place] FDA and  has been authorized for detection and/or diagnosis of SARS-CoV-2 by FDA under an Emergency Use Authorization (EUA). This EUA will remain  in effect (meaning this test can be used) for the duration of the COVID-19 declaration under Se ction 564(b)(1) of the Act, 21 U.S.C. section 360bbb-3(b)(1), unless the authorization is terminated or revoked sooner.  Performed at [facility], [address], [place], [place] [address]     Labs: CBC: Recent Labs  Lab 01/17/22 1143 01/18/22 1251 01/19/22 0529 01/19/22 0633 01/20/22 0528  WBC 3.5* 4.4 3.4*  --  3.2*  NEUTROABS  --  3.3  --   --   --   HGB 16.3* 16.7* 13.1  --  11.9*  HCT 47.2* 47.3* 39.8 37.8 38.8  MCV 99.8 101.3* 107.3*  --  109.9*  PLT 85* 99* 92*  --  113*   Basic Metabolic Panel: Recent Labs  Lab 01/17/22 1143 01/18/22 1251 01/19/22 0529 01/19/22 0633 01/20/22 0528  NA 132* 129* 135  --  134*  K 2.9* 3.2* 3.4*  --  3.6  CL 91* 90* 102  --  103  CO2 25 19* 24  --  24  GLUCOSE 147* 227* 79  --  85  BUN 19 15 9   --  <5*  CREATININE 0.61 0.68 0.50  --  0.52  CALCIUM 9.5 9.4 8.4*  --  8.1*  MG  --  2.0  --  2.0 1.7   Liver  Function Tests: Recent Labs  Lab 01/18/22 1251 01/19/22 0633 01/20/22 0528  AST <5* 204* 135*  ALT 216* 148* 115*  ALKPHOS 176* 133* 126  BILITOT 1.9* 1.4* 1.1  PROT 8.3* 6.4* 5.9*  ALBUMIN 3.9 2.9* 2.7*   CBG: Recent Labs  Lab 01/18/22 1236  GLUCAP 241*    Discharge time spent: greater than 30 minutes.  Signed: Catarina Hartshorn, MD [facility] 01/20/2022

## 2022-01-20 NOTE — Progress Notes (Signed)
Patient has been stable this shift with no new issues.

## 2022-01-20 NOTE — Assessment & Plan Note (Signed)
Cholestatic picture -improved at time of d/c

## 2022-01-22 ENCOUNTER — Other Ambulatory Visit: Payer: Self-pay | Admitting: Internal Medicine

## 2022-01-22 DIAGNOSIS — E782 Mixed hyperlipidemia: Secondary | ICD-10-CM

## 2022-01-26 ENCOUNTER — Encounter: Payer: Self-pay | Admitting: Internal Medicine

## 2022-01-26 ENCOUNTER — Ambulatory Visit (INDEPENDENT_AMBULATORY_CARE_PROVIDER_SITE_OTHER): Payer: Commercial Managed Care - PPO | Admitting: Internal Medicine

## 2022-01-26 ENCOUNTER — Other Ambulatory Visit: Payer: Self-pay

## 2022-01-26 ENCOUNTER — Encounter: Payer: Self-pay | Admitting: Orthopaedic Surgery

## 2022-01-26 ENCOUNTER — Ambulatory Visit: Payer: Commercial Managed Care - PPO | Admitting: Internal Medicine

## 2022-01-26 VITALS — BP 138/108 | HR 96 | Resp 18 | Ht 64.0 in | Wt 155.0 lb

## 2022-01-26 DIAGNOSIS — G40909 Epilepsy, unspecified, not intractable, without status epilepticus: Secondary | ICD-10-CM

## 2022-01-26 DIAGNOSIS — Z01818 Encounter for other preprocedural examination: Secondary | ICD-10-CM | POA: Diagnosis not present

## 2022-01-26 DIAGNOSIS — I10 Essential (primary) hypertension: Secondary | ICD-10-CM | POA: Diagnosis not present

## 2022-01-26 DIAGNOSIS — R7401 Elevation of levels of liver transaminase levels: Secondary | ICD-10-CM

## 2022-01-26 DIAGNOSIS — D539 Nutritional anemia, unspecified: Secondary | ICD-10-CM

## 2022-01-26 DIAGNOSIS — M87052 Idiopathic aseptic necrosis of left femur: Secondary | ICD-10-CM

## 2022-01-26 HISTORY — DX: Encounter for other preprocedural examination: Z01.818

## 2022-01-26 MED ORDER — OXYCODONE-ACETAMINOPHEN 5-325 MG PO TABS: 1.0000 | ORAL_TABLET | Freq: Four times a day (QID) | ORAL | 0 refills | Status: DC | PRN

## 2022-01-26 MED ORDER — HYDRALAZINE HCL 25 MG PO TABS: 25.0000 mg | ORAL_TABLET | Freq: Three times a day (TID) | ORAL | 2 refills | Status: DC

## 2022-01-26 NOTE — Assessment & Plan Note (Addendum)
Needs to take Keppra regularly ?Advised to avoid alcohol use ?Advised to contact her neurologist regarding recent seizure episode ?

## 2022-01-26 NOTE — Assessment & Plan Note (Signed)
Has uncontrolled HTN currently, likely due to underlying severe hip pain ?Added hydralazine for additional HTN control ?Advised to contact with home BP readings after 1 week ?Advised to contact her neurologist regarding recent seizure episode ?EKG reviewed from recent hospital visit -sinus tachycardia.  No signs of active ischemia. ? ?If her DBP improves with Hydralazine, she would be medically optimized for THA. ?

## 2022-01-26 NOTE — Assessment & Plan Note (Addendum)
Likely due to recent seizure episode and alcohol intake ?Downtrending, check CMP today ?

## 2022-01-26 NOTE — Assessment & Plan Note (Signed)
BP Readings from Last 1 Encounters:  ?01/26/22 (!) 138/108  ? ?Elevated today due to severe hip pain ?Was well-controlled with Telmisartan-HCTZ 40-12.5 mg QD ?Added hydralazine 25 mg TID for now ?Counseled for compliance with the medications ?Advised DASH diet and moderate exercise/walking, at least 150 mins/week ?

## 2022-01-26 NOTE — Progress Notes (Signed)
Established Patient Office Visit  Subjective:  Patient ID: Kelsey [name], female    DOB: September 04, 1981  Age: 41 y.o. MRN: [id number]  CC:  Chief Complaint  Patient presents with   Follow-up    Follow up pt was supposed to have left hip replacement 01-20-22 but had seizures and went to [place] 01-18-22 before she can have surgery they wanted her to be checked by pcp     HPI Kelsey [name] is a 41 y.o. female with past medical history of HTN, asthma, GERD, seizure disorder, depression with anxiety, chronic back and hip pain and tobacco abuse who presents for f/u of her chronic medical conditions and preop eval.  HTN: Her blood pressure was elevated in the office today. Of note, she is having severe hip pain. She has episodes of headache, which intermittent, dull and generalized headache.  Denies any chest pain or palpitations.  She was admitted in the hospital for seizure episode as she had stopped taking her Keppra. She has started taking it again and has not had any episode of seizure recently. She also admits that she was having alcohol on a daily basis as she was having severe hip pain. She denies any recent alcohol intake since being discharged.  Her MRI of hip showed avascular necrosis of left hip and is going to have THA for it.  She is here for preop eval.  Past Medical History:  Diagnosis Date   Anxiety    Asthma    hosp. 2018 and 2019 no h/o intubation    Chronic bronchitis ([place])    "get it pretty much q yr" (12/13/2015)   Depression    Depression    GERD (gastroesophageal reflux disease)    Hypertension    Insomnia    Palpitations    Perianal abscess 11/22/2016   12/07/2016.  [facility], [place].  [name], MD.  Perianal abscess is much better but still needs local wound care.  Continue sitz baths 2-3 times perday until all drainage and pain are gone.  Follow up as needed.   PONV (postoperative nausea and vomiting)    Pre-syncope    PVC (premature  ventricular contraction)    Seasonal allergies    Seizures (HCC)    thought 2/2 benzo w/d after overuse 03/29/21   Tachycardia     Past Surgical History:  Procedure Laterality Date   CESAREAN SECTION  10/15/2009, 02/08/2006   INCISION AND DRAINAGE PERIRECTAL ABSCESS Bilateral 11/22/2016   Procedure: IRRIGATION AND DEBRIDEMENT PERIRECTAL ABSCESS;  Surgeon: [name], MD;  Location: [facility];  Service: General;  Laterality: Bilateral;   INTRAUTERINE DEVICE INSERTION     Mirena 12/24/19 exp 12/28/21 Dr Casimer Bilis [name] ob/gyn    Family History  Problem Relation Age of Onset   Rheumatologic disease Mother    Arthritis Mother    Asthma Mother    Depression Mother    Heart disease Father    Asthma Maternal Grandmother    Hypertension Maternal Grandmother    Heart disease Maternal Grandfather    Lung cancer Paternal Grandmother    Healthy Daughter    Healthy Son     Social History   Socioeconomic History   Marital status: Married    Spouse name: Not on file   Number of children: 2   Years of education: Not on file   Highest education level: Not on file  Occupational History   Occupation: respiratory therapists    Employer: [employer]  Tobacco Use   Smoking status: Every Day    Packs/day: 0.25    Years: 10.00    Pack years: 2.50    Types: Cigarettes   Smokeless tobacco: Never  Vaping Use   Vaping Use: Never used  Substance and Sexual Activity   Alcohol use: No   Drug use: No   Sexual activity: Not on file  Other Topics Concern   Not on file  Social History Narrative   Respiratory Therapist, 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[place] [employer]    2 kids    Divorced but remarried since 05/2019 new last name will be Carlis Abbott   Wears seat belt, feels safe in relationship    Social Determinants of Health   Financial Resource Strain: Not on file  Food Insecurity: Not on file  Transportation Needs: Not on file  Physical Activity: Not on file  Stress: Not on file   Social Connections: Not on file  Intimate Partner Violence: Not on file    Outpatient Medications Prior to Visit  Medication Sig Dispense Refill   albuterol (VENTOLIN HFA) 108 (90 Base) MCG/ACT inhaler Inhale 1-2 puffs into the lungs every 6 (six) hours as needed for wheezing or shortness of breath. 1 each 11   ALPRAZolam (XANAX) 1 MG tablet Take 1 tablet (1 mg total) by mouth at bedtime as needed for anxiety. (Patient taking differently: Take 1 mg by mouth 3 (three) times daily.) 7 tablet 0   cyclobenzaprine (FLEXERIL) 10 MG tablet Take 1 tablet (10 mg total) by mouth every 12 (twelve) hours as needed for muscle spasms. 60 tablet 2   diphenhydrAMINE (BENADRYL) 25 MG tablet Take 25 mg by mouth every 6 (six) hours as needed for allergies.     FLUoxetine (PROZAC) 40 MG capsule Take 80 mg by mouth daily.     gabapentin (NEURONTIN) 300 MG capsule TAKE 2 CAPSULES (600 MG TOTAL) BY MOUTH 3 (THREE) TIMES DAILY. (Patient taking differently: Take 1,200 mg by mouth 4 (four) times daily.) 540 capsule 1   ibuprofen (ADVIL) 200 MG tablet Take 400 mg by mouth every 6 (six) hours as needed for moderate pain or headache.     levETIRAcetam (KEPPRA) 500 MG tablet Take 1 tablet (500 mg total) by mouth 2 (two) times daily. 60 tablet 1   levonorgestrel (MIRENA) 20 MCG/24HR IUD 1 Intra Uterine Device (1 each total) by Intrauterine route once. 1 each 0   naproxen (NAPROSYN) 500 MG tablet Take 500 mg by mouth 2 (two) times daily as needed for moderate pain.     telmisartan-hydrochlorothiazide (MICARDIS HCT) 40-12.5 MG tablet TAKE 1 TABLET BY MOUTH EVERY DAY 90 tablet 1   oxyCODONE-acetaminophen (PERCOCET/ROXICET) 5-325 MG tablet Take 1 tablet by mouth every 6 (six) hours as needed for severe pain. 20 tablet 0   No facility-administered medications prior to visit.    Allergies  Allergen Reactions   Penicillins Rash    Has patient had a PCN reaction causing immediate rash, facial/tongue/throat swelling, SOB or  lightheadedness with hypotension: No Has patient had a PCN reaction causing severe rash involving mucus membranes or skin necrosis: No Has patient had a PCN reaction that required hospitalization No Has patient had a PCN reaction occurring within the last 10 years: No If all of the above answers are "NO", then may proceed with Cephalosporin use.    ROS Review of Systems  Constitutional:  Positive for fatigue. Negative for chills and fever.  HENT:  Negative for congestion, sinus  pressure, sinus pain and sore throat.   Eyes:  Negative for pain and discharge.  Respiratory:  Negative for cough, shortness of breath and wheezing.   Cardiovascular:  Negative for chest pain and palpitations.  Gastrointestinal:  Negative for abdominal pain, constipation, diarrhea, nausea and vomiting.  Endocrine: Negative for polydipsia and polyuria.  Genitourinary:  Negative for dysuria and hematuria.  Musculoskeletal:  Positive for arthralgias and back pain. Negative for neck pain and neck stiffness.  Skin:  Negative for rash.  Neurological:  Positive for seizures. Negative for dizziness and weakness.  Psychiatric/Behavioral:  Positive for sleep disturbance. Negative for agitation and behavioral problems. The patient is nervous/anxious.      Objective:    Physical Exam Vitals reviewed.  Constitutional:      General: She is not in acute distress.    Appearance: She is not diaphoretic.  HENT:     Head: Normocephalic and atraumatic.     Nose: Nose normal.     Mouth/Throat:     Mouth: Mucous membranes are moist.  Eyes:     General: No scleral icterus.    Extraocular Movements: Extraocular movements intact.  Cardiovascular:     Rate and Rhythm: Normal rate and regular rhythm.     Pulses: Normal pulses.     Heart sounds: Normal heart sounds. No murmur heard. Pulmonary:     Breath sounds: Normal breath sounds. No wheezing or rales.  Musculoskeletal:        General: Tenderness (B/l hip) present.      Cervical back: Neck supple. No tenderness.     Right lower leg: No edema.     Left lower leg: No edema.  Skin:    General: Skin is warm.     Findings: No rash.  Neurological:     General: No focal deficit present.     Mental Status: She is alert and oriented to person, place, and time.     Sensory: No sensory deficit.     Motor: No weakness.  Psychiatric:        Mood and Affect: Mood normal.        Behavior: Behavior normal.    BP (!) 138/108 (BP Location: Left Arm, Cuff Size: Normal)    Pulse 96    Resp 18    Ht 5' 4"  (1.626 m)    Wt 155 lb (70.3 kg)    SpO2 97%    BMI 26.61 kg/m  Wt Readings from Last 3 Encounters:  01/26/22 155 lb (70.3 kg)  01/18/22 156 lb 12 oz (71.1 kg)  12/20/21 157 lb 6.4 oz (71.4 kg)    Lab Results  Component Value Date   TSH 1.597 01/19/2022   Lab Results  Component Value Date   WBC 3.2 (L) 01/20/2022   HGB 11.9 (L) 01/20/2022   HCT 38.8 01/20/2022   MCV 109.9 (H) 01/20/2022   PLT 113 (L) 01/20/2022   Lab Results  Component Value Date   NA 134 (L) 01/20/2022   K 3.6 01/20/2022   CO2 24 01/20/2022   GLUCOSE 85 01/20/2022   BUN <5 (L) 01/20/2022   CREATININE 0.52 01/20/2022   BILITOT 1.1 01/20/2022   ALKPHOS 126 01/20/2022   AST 135 (H) 01/20/2022   ALT 115 (H) 01/20/2022   PROT 5.9 (L) 01/20/2022   ALBUMIN 2.7 (L) 01/20/2022   CALCIUM 8.1 (L) 01/20/2022   ANIONGAP 7 01/20/2022   EGFR 113 07/28/2021   GFR 97.90 01/30/2020   Lab Results  Component  Value Date   CHOL 231 (H) 07/28/2021   Lab Results  Component Value Date   HDL 50 07/28/2021   Lab Results  Component Value Date   LDLCALC 85 07/28/2021   Lab Results  Component Value Date   TRIG 595 (HH) 07/28/2021   Lab Results  Component Value Date   CHOLHDL 4.6 (H) 07/28/2021   Lab Results  Component Value Date   HGBA1C 5.0 01/19/2022      Assessment & Plan:   Preop general physical exam Has uncontrolled HTN currently, likely due to underlying severe hip  pain Added hydralazine for additional HTN control Advised to contact with home BP readings after 1 week Advised to contact her neurologist regarding recent seizure episode EKG reviewed from recent hospital visit -sinus tachycardia.  No signs of active ischemia.  If her DBP improves with Hydralazine, she would be medically optimized for THA.  Essential hypertension BP Readings from Last 1 Encounters:  01/26/22 (!) 138/108   Elevated today due to severe hip pain Was well-controlled with Telmisartan-HCTZ 40-12.5 mg QD Added hydralazine 25 mg TID for now Counseled for compliance with the medications Advised DASH diet and moderate exercise/walking, at least 150 mins/week  Seizure disorder ([place]) Needs to take Keppra regularly Advised to avoid alcohol use Advised to contact her neurologist regarding recent seizure episode  Avascular necrosis of bone of left hip ([place]) Noted on MRI of hip Was planned to get THA of hip, but it was postponed due to her recent seizure episode Refilled Percocet for now  Transaminitis Likely due to recent seizure episode and alcohol intake Downtrending, check CMP today    Meds ordered this encounter  Medications   hydrALAZINE (APRESOLINE) 25 MG tablet    Sig: Take 1 tablet (25 mg total) by mouth 3 (three) times daily.    Dispense:  90 tablet    Refill:  2   oxyCODONE-acetaminophen (PERCOCET/ROXICET) 5-325 MG tablet    Sig: Take 1 tablet by mouth every 6 (six) hours as needed for severe pain.    Dispense:  30 tablet    Refill:  0    Follow-up: Return in about 3 months (around 04/28/2022) for HTN.    Lindell Spar, MD

## 2022-01-26 NOTE — Assessment & Plan Note (Signed)
Noted on MRI of hip ?Was planned to get THA of hip, but it was postponed due to her recent seizure episode ?Refilled Percocet for now ?

## 2022-01-26 NOTE — Patient Instructions (Signed)
Please start taking Hydralazine as prescribed. ? ?Please continue to take other medications as prescribed. ? ?Please refrain from smoking. ?

## 2022-01-27 LAB — CBC WITH DIFFERENTIAL/PLATELET
Basophils Absolute: 0.1 10*3/uL (ref 0.0–0.2)
Basos: 1 %
EOS (ABSOLUTE): 0.1 10*3/uL (ref 0.0–0.4)
Eos: 3 %
Hematocrit: 40.3 % (ref 34.0–46.6)
Hemoglobin: 13.6 g/dL (ref 11.1–15.9)
Immature Grans (Abs): 0 10*3/uL (ref 0.0–0.1)
Immature Granulocytes: 0 %
Lymphocytes Absolute: 1.1 10*3/uL (ref 0.7–3.1)
Lymphs: 21 %
MCH: 33.9 pg — ABNORMAL HIGH (ref 26.6–33.0)
MCHC: 33.7 g/dL (ref 31.5–35.7)
MCV: 101 fL — ABNORMAL HIGH (ref 79–97)
Monocytes Absolute: 0.6 10*3/uL (ref 0.1–0.9)
Monocytes: 12 %
Neutrophils Absolute: 3.3 10*3/uL (ref 1.4–7.0)
Neutrophils: 63 %
Platelets: 423 10*3/uL (ref 150–450)
RBC: 4.01 x10E6/uL (ref 3.77–5.28)
RDW: 12 % (ref 11.7–15.4)
WBC: 5.2 10*3/uL (ref 3.4–10.8)

## 2022-01-27 LAB — CMP14+EGFR
ALT: 67 IU/L — ABNORMAL HIGH (ref 0–32)
AST: 67 IU/L — ABNORMAL HIGH (ref 0–40)
Albumin/Globulin Ratio: 1.4 (ref 1.2–2.2)
Albumin: 4 g/dL (ref 3.8–4.8)
Alkaline Phosphatase: 161 IU/L — ABNORMAL HIGH (ref 44–121)
BUN/Creatinine Ratio: 20 (ref 9–23)
BUN: 13 mg/dL (ref 6–24)
Bilirubin Total: 0.4 mg/dL (ref 0.0–1.2)
CO2: 23 mmol/L (ref 20–29)
Calcium: 9.2 mg/dL (ref 8.7–10.2)
Chloride: 102 mmol/L (ref 96–106)
Creatinine, Ser: 0.64 mg/dL (ref 0.57–1.00)
Globulin, Total: 2.9 g/dL (ref 1.5–4.5)
Glucose: 93 mg/dL (ref 70–99)
Potassium: 4.2 mmol/L (ref 3.5–5.2)
Sodium: 138 mmol/L (ref 134–144)
Total Protein: 6.9 g/dL (ref 6.0–8.5)
eGFR: 114 mL/min/{1.73_m2} (ref >59)

## 2022-01-30 ENCOUNTER — Telehealth: Payer: Self-pay | Admitting: Internal Medicine

## 2022-01-30 ENCOUNTER — Telehealth: Payer: Self-pay

## 2022-01-30 NOTE — Telephone Encounter (Signed)
Pt returning call for lab results  

## 2022-01-31 NOTE — Telephone Encounter (Signed)
Pt advised of lab results with verbal understanding  

## 2022-02-02 ENCOUNTER — Encounter: Payer: Commercial Managed Care - PPO | Admitting: Orthopaedic Surgery

## 2022-02-02 ENCOUNTER — Ambulatory Visit: Payer: Commercial Managed Care - PPO | Admitting: Internal Medicine

## 2022-02-03 ENCOUNTER — Encounter: Payer: Self-pay | Admitting: Orthopaedic Surgery

## 2022-02-05 ENCOUNTER — Telehealth: Payer: Self-pay | Admitting: Orthopaedic Surgery

## 2022-02-06 NOTE — Telephone Encounter (Signed)
Having forms refaxed  ?

## 2022-02-14 ENCOUNTER — Other Ambulatory Visit: Payer: Self-pay | Admitting: Physician Assistant

## 2022-02-14 DIAGNOSIS — M87052 Idiopathic aseptic necrosis of left femur: Secondary | ICD-10-CM

## 2022-02-15 ENCOUNTER — Other Ambulatory Visit: Payer: Self-pay | Admitting: Internal Medicine

## 2022-02-15 ENCOUNTER — Other Ambulatory Visit: Payer: Self-pay | Admitting: Family Medicine

## 2022-02-15 DIAGNOSIS — M130 Polyarthritis, unspecified: Secondary | ICD-10-CM

## 2022-02-15 DIAGNOSIS — M47816 Spondylosis without myelopathy or radiculopathy, lumbar region: Secondary | ICD-10-CM

## 2022-02-15 DIAGNOSIS — F40240 Claustrophobia: Secondary | ICD-10-CM

## 2022-02-15 DIAGNOSIS — E782 Mixed hyperlipidemia: Secondary | ICD-10-CM

## 2022-02-15 MED ORDER — CYCLOBENZAPRINE HCL 10 MG PO TABS: 10.0000 mg | ORAL_TABLET | Freq: Two times a day (BID) | ORAL | 2 refills | Status: DC | PRN

## 2022-02-15 NOTE — Patient Instructions (Signed)
DUE TO COVID-19 ONLY ONE VISITOR  (aged 42 and older)  IS ALLOWED TO COME WITH YOU AND STAY IN THE WAITING ROOM ONLY DURING PRE OP AND PROCEDURE.   ?**NO VISITORS ARE ALLOWED IN THE SHORT STAY AREA OR RECOVERY ROOM!!** ? ?IF YOU WILL BE ADMITTED INTO THE HOSPITAL YOU ARE ALLOWED ONLY TWO SUPPORT PEOPLE DURING VISITATION HOURS ONLY (7 AM -8PM)   ?The support person(s) must pass our screening, gel in and out, and wear a mask at all times, including in the patient?s room. ?Patients must also wear a mask when staff or their support person are in the room. ?Visitors GUEST BADGE MUST BE WORN VISIBLY  ?One adult visitor may remain with you overnight and MUST be in the room by 8 P.M. ?  ? ? Your procedure is scheduled on: 02/17/22 ? ? [facility] ? ?  Report to admitting at  7:15 AM ? ? Call this number if you have problems the morning of surgery [phone] ? ? Do not eat food :After Midnight. ? ? After Midnight you may have the following liquids until __6:30____ AM DAY OF SURGERY ? ?Water ?Black Coffee (sugar ok, NO MILK/CREAM OR CREAMERS)  ?Tea (sugar ok, NO MILK/CREAM OR CREAMERS) regular and decaf                             ?Plain Jell-O (NO RED)                                           ?Fruit ices (not with fruit pulp, NO RED)                                     ?Popsicles (NO RED)                                                                  ?Juice: apple, WHITE grape, WHITE cranberry ?Sports drinks like Gatorade (NO RED) ?Clear broth(vegetable,chicken,beef) ? ?              ? ?  ?  ?The day of surgery:  ?Drink ONE (1) Pre-Surgery Clear Ensure at  6:15 AM the morning of surgery. Drink in one sitting. Do not sip.  ?This drink was given to you during your hospital  ?pre-op appointment visit. ?Nothing else to drink after completing the  ?Pre-Surgery Clear Ensure at 6:30 am ?  ?       If you have questions, please contact your surgeon?s office. ? ? ?FOLLOW BOWEL PREP AND ANY  ADDITIONAL PRE OP INSTRUCTIONS YOU RECEIVED FROM YOUR SURGEON'S OFFICE!!! ?  ?  ?Oral Hygiene is also important to reduce your risk of infection.                                    ?Remember - BRUSH YOUR TEETH THE MORNING OF SURGERY WITH YOUR REGULAR TOOTHPASTE ? ? Do NOT smoke after  Midnight ? ? Take these medicines the morning of surgery with A SIP OF WATER: Gabapentin, Keppra, Fluoxitine, Hydralazine,  ?         Use your inhalers and bring them with you ? ? ?                  ?           You may not have any metal on your body including hair pins, jewelry, and body piercing ? ?           Do not wear make-up, lotions, powders, perfumes/cologne, or deodorant ? ?Do not wear nail polish including gel and S&S, artificial/acrylic nails, or any other type of covering on natural nails including finger and toenails. If you have artificial nails, gel coating, etc. that needs to be removed by a nail salon please have this removed prior to surgery or surgery may need to be canceled/ delayed if the surgeon/ anesthesia feels like they are unable to be safely monitored.  ? ?Do not shave  48 hours prior to surgery.  ? ? ? Do not bring valuables to the hospital. [facility] IS NOT ?            RESPONSIBLE   FOR VALUABLES. ? ? Contacts, dentures or bridgework may not be worn into surgery. ? ? Bring small overnight bag day of surgery. ?  ? Patients discharged on the day of surgery will not be allowed to drive home.  Someone NEEDS to stay with you for the first 24 hours after anesthesia. ? ? Special Instructions: Bring a copy of your healthcare power of attorney and living will document the day of surgery if you haven't scanned them before. ? ?            Please read over the following fact sheets you were given: IF YOU HAVE QUESTIONS ABOUT YOUR PRE-OP INSTRUCTIONS PLEASE CALL [phone] ? ?   [facility] - Preparing for Surgery ?Before surgery, you can play an important role.  Because skin is not sterile, your skin needs to be as  free of germs as possible.  You can reduce the number of germs on your skin by washing with CHG (chlorahexidine gluconate) soap before surgery.  CHG is an antiseptic cleaner which kills germs and bonds with the skin to continue killing germs even after washing. ?Please DO NOT use if you have an allergy to CHG or antibacterial soaps.  If your skin becomes reddened/irritated stop using the CHG and inform your nurse when you arrive at Short Stay. ?Do not shave (including legs and underarms) for at least 48 hours prior to the first CHG shower.  ?Please follow these instructions carefully: ? 1.  Shower with CHG Soap the night before surgery and the  morning of Surgery. ? 2.  If you choose to wash your hair, wash your hair first as usual with your  normal  shampoo. ? 3.  After you shampoo, rinse your hair and body thoroughly to remove the  shampoo.                           ? 4.  Use CHG as you would any other liquid soap.  You can apply chg directly  to the skin and wash  ?                     Gently with a scrungie or clean  washcloth. ? 5.  Apply the CHG Soap to your body ONLY FROM THE NECK DOWN.   Do not use on face/ open      ?                     Wound or open sores. Avoid contact with eyes, ears mouth and genitals (private parts).  ?                     Engineering geologist,  Genitals (private parts) with your normal soap. ?            6.  Wash thoroughly, paying special attention to the area where your surgery  will be performed. ? 7.  Thoroughly rinse your body with warm water from the neck down. ? 8.  DO NOT shower/wash with your normal soap after using and rinsing off  the CHG Soap. ?               9.  Pat yourself dry with a clean towel. ?           10.  Wear clean pajamas. ?           11.  Place clean sheets on your bed the night of your first shower and do not  sleep with pets. ?Day of Surgery : ?Do not apply any lotions/deodorants the morning of surgery.  Please wear clean clothes to the hospital/surgery  center. ? ?FAILURE TO FOLLOW THESE INSTRUCTIONS MAY RESULT IN THE CANCELLATION OF YOUR SURGERY ? ? ?________________________________________________________________________  ? ?Incentive Spirometer ? ?An incentive spirometer is a tool that can help keep your lungs clear and active. This tool measures how well you are filling your lungs with each breath. Taking long deep breaths may help reverse or decrease the chance of developing breathing (pulmonary) problems (especially infection) following: ?A long period of time when you are unable to move or be active. ?BEFORE THE PROCEDURE  ?If the spirometer includes an indicator to show your best effort, your nurse or respiratory therapist will set it to a desired goal. ?If possible, sit up straight or lean slightly forward. Try not to slouch. ?Hold the incentive spirometer in an upright position. ?INSTRUCTIONS FOR USE  ?Sit on the edge of your bed if possible, or sit up as far as you can in bed or on a chair. ?Hold the incentive spirometer in an upright position. ?Breathe out normally. ?Place the mouthpiece in your mouth and seal your lips tightly around it. ?Breathe in slowly and as deeply as possible, raising the piston or the ball toward the top of the column. ?Hold your breath for 3-5 seconds or for as long as possible. Allow the piston or ball to fall to the bottom of the column. ?Remove the mouthpiece from your mouth and breathe out normally. ?Rest for a few seconds and repeat Steps 1 through 7 at least 10 times every 1-2 hours when you are awake. Take your time and take a few normal breaths between deep breaths. ?The spirometer may include an indicator to show your best effort. Use the indicator as a goal to work toward during each repetition. ?After each set of 10 deep breaths, practice coughing to be sure your lungs are clear. If you have an incision (the cut made at the time of surgery), support your incision when coughing by placing a pillow or rolled up towels  firmly against it. ?Once you are able to get out  of bed, walk around indoors and cough well. You may stop using the incentive spirometer when instructed by your caregiver.  ?RISKS AND COMPLICATIONS ?Take your time so you

## 2022-02-16 ENCOUNTER — Other Ambulatory Visit: Payer: Self-pay

## 2022-02-16 ENCOUNTER — Encounter (HOSPITAL_COMMUNITY)
Admission: RE | Admit: 2022-02-16 | Discharge: 2022-02-16 | Disposition: A | Discharge status: Home / Self Care | Payer: Commercial Managed Care - PPO | Source: Ambulatory Visit | Attending: Orthopaedic Surgery | Admitting: Orthopaedic Surgery

## 2022-02-16 ENCOUNTER — Encounter (HOSPITAL_COMMUNITY): Payer: Self-pay

## 2022-02-16 VITALS — BP 122/95 | HR 116 | Temp 98.1°F | Resp 16 | Ht 64.0 in | Wt 145.0 lb

## 2022-02-16 DIAGNOSIS — M87052 Idiopathic aseptic necrosis of left femur: Secondary | ICD-10-CM

## 2022-02-16 DIAGNOSIS — Z01812 Encounter for preprocedural laboratory examination: Secondary | ICD-10-CM | POA: Insufficient documentation

## 2022-02-16 DIAGNOSIS — Z01818 Encounter for other preprocedural examination: Secondary | ICD-10-CM

## 2022-02-16 LAB — SURGICAL PCR SCREEN
MRSA, PCR: NEGATIVE
Staphylococcus aureus: POSITIVE — AB

## 2022-02-16 NOTE — H&P (Signed)
TOTAL HIP ADMISSION H&P ? ?Patient is admitted for left total hip arthroplasty. ? ?Subjective: ? ?Chief Complaint: left hip pain ? ?HPI: Kelsey RailJennifer L [name], 41 y.o. female, has a history of pain and functional disability in the left hip(s) due to  avascular necrosis  and patient has failed non-surgical conservative treatments for greater than 12 weeks to include NSAID's and/or analgesics and activity modification.  Onset of symptoms was abrupt starting 1 years ago with rapidlly worsening course since that time.The patient noted no past surgery on the left hip(s).  Patient currently rates pain in the left hip at 10 out of 10 with activity. Patient has night pain, worsening of pain with activity and weight bearing, pain that interfers with activities of daily living, and pain with passive range of motion. Patient has evidence of subchondral cysts and avascular necrosis  by imaging studies. This condition presents safety issues increasing the risk of falls. This patient has had avascular necrosis of the hip, acetabular fracture, hip dysplasia.  There is no current active infection. ? ?Patient Active Problem List  ? Diagnosis Date Noted  ? Transaminitis 01/26/2022  ? Macrocytic anemia 01/26/2022  ? Preop general physical exam 01/26/2022  ? Thrombocytopenia (HCC) 01/18/2022  ? Avascular necrosis of bone of left hip (HCC) 12/20/2021  ? Avascular necrosis of bone of right hip (HCC) 12/20/2021  ? Lumbar spondylosis 10/05/2021  ? Positive double stranded DNA antibody test 09/07/2021  ? Mixed hyperlipidemia 08/16/2021  ? Seizure disorder (HCC) 07/28/2021  ? Chronic hip pain 07/28/2021  ? Essential hypertension 07/28/2021  ? Tobacco abuse 07/28/2021  ? Polyarthritis 07/28/2021  ? Anal skin tag 10/21/2019  ? Internal hemorrhoids 09/26/2017  ? Asthma 10/15/2015  ? Allergic rhinitis 10/15/2015  ? Alcohol abuse 07/14/2015  ? GAD (generalized anxiety disorder) 07/14/2015  ? Depression, major, recurrent, moderate (HCC) 07/14/2015  ?  GERD (gastroesophageal reflux disease) 08/22/2013  ? ?Past Medical History:  ?Diagnosis Date  ? Anxiety   ? Asthma   ? hosp. 2018 and 2019 no h/o intubation   ? Depression   ? GERD (gastroesophageal reflux disease)   ? Hypertension   ? Palpitations   ? Perianal abscess 11/22/2016  ? 12/07/2016.  [facility] [place]  Ovidio Kinavid Newman, MD.  Perianal abscess is much better but still needs local wound care.  Continue sitz baths 2-3 times perday until all drainage and pain are gone.  Follow up as needed.  ? PVC (premature ventricular contraction)   ? Seasonal allergies   ? Seizures (HCC)   ? 01/15/22 was last seizure  ? Tachycardia   ?  ?Past Surgical History:  ?Procedure Laterality Date  ? CESAREAN SECTION  10/15/2009, 02/08/2006  ? INCISION AND DRAINAGE PERIRECTAL ABSCESS Bilateral 11/22/2016  ? Procedure: IRRIGATION AND DEBRIDEMENT PERIRECTAL ABSCESS;  Surgeon: Ovidio Kinavid Newman, MD;  Location: [facility];  Service: General;  Laterality: Bilateral;  ? INTRAUTERINE DEVICE INSERTION    ? Mirena 12/24/19 exp 12/28/21 Dr Luther ParodySidney Tami Ribasallahan [facility]  ?  ?No current facility-administered medications for this encounter.  ? ?Current Outpatient Medications  ?Medication Sig Dispense Refill Last Dose  ? albuterol (VENTOLIN HFA) 108 (90 Base) MCG/ACT inhaler Inhale 1-2 puffs into the lungs every 6 (six) hours as needed for wheezing or shortness of breath. 1 each 11   ? ALPRAZolam (XANAX) 1 MG tablet Take 1 tablet (1 mg total) by mouth at bedtime as needed for anxiety. (Patient taking differently: Take 1 mg by mouth 3 (three) times daily.)  7 tablet 0   ? diphenhydrAMINE (BENADRYL) 25 MG tablet Take 25 mg by mouth every 6 (six) hours as needed for allergies.     ? FLUoxetine (PROZAC) 40 MG capsule Take 80 mg by mouth daily.     ? gabapentin (NEURONTIN) 300 MG capsule TAKE 2 CAPSULES (600 MG TOTAL) BY MOUTH 3 (THREE) TIMES DAILY. (Patient taking differently: Take 1,200 mg by mouth 4 (four) times daily.) 540 capsule 1   ?  hydrALAZINE (APRESOLINE) 25 MG tablet Take 1 tablet (25 mg total) by mouth 3 (three) times daily. 90 tablet 2   ? ibuprofen (ADVIL) 200 MG tablet Take 400 mg by mouth every 6 (six) hours as needed for moderate pain or headache.     ? levETIRAcetam (KEPPRA) 500 MG tablet Take 1 tablet (500 mg total) by mouth 2 (two) times daily. 60 tablet 1   ? levonorgestrel (MIRENA) 20 MCG/24HR IUD 1 Intra Uterine Device (1 each total) by Intrauterine route once. 1 each 0   ? naproxen (NAPROSYN) 500 MG tablet TAKE 1 TABLET BY MOUTH 2 TIMES DAILY WITH A MEAL. (Patient taking differently: Take 500 mg by mouth 2 (two) times daily as needed for moderate pain.) 60 tablet 5   ? oxyCODONE-acetaminophen (PERCOCET/ROXICET) 5-325 MG tablet Take 1 tablet by mouth every 6 (six) hours as needed for severe pain. 30 tablet 0   ? telmisartan-hydrochlorothiazide (MICARDIS HCT) 40-12.5 MG tablet TAKE 1 TABLET BY MOUTH EVERY DAY 90 tablet 1   ? cyclobenzaprine (FLEXERIL) 10 MG tablet Take 1 tablet (10 mg total) by mouth every 12 (twelve) hours as needed for muscle spasms. 60 tablet 2   ? ?Allergies  ?Allergen Reactions  ? Penicillins Rash  ?  Has patient had a PCN reaction causing immediate rash, facial/tongue/throat swelling, SOB or lightheadedness with hypotension: No ?Has patient had a PCN reaction causing severe rash involving mucus membranes or skin necrosis: No ?Has patient had a PCN reaction that required hospitalization No ?Has patient had a PCN reaction occurring within the last 10 years: No ?If all of the above answers are "NO", then may proceed with Cephalosporin use.  ?  ?Social History  ? ?Tobacco Use  ? Smoking status: Every Day  ?  Packs/day: 0.25  ?  Years: 10.00  ?  Pack years: 2.50  ?  Types: Cigarettes  ? Smokeless tobacco: Never  ?Substance Use Topics  ? Alcohol use: No  ?  ?Family History  ?Problem Relation Age of Onset  ? Rheumatologic disease Mother   ? Arthritis Mother   ? Asthma Mother   ? Depression Mother   ? Heart disease  Father   ? Asthma Maternal Grandmother   ? Hypertension Maternal Grandmother   ? Heart disease Maternal Grandfather   ? Lung cancer Paternal Grandmother   ? Healthy Daughter   ? Healthy Son   ?  ? ?Review of Systems  ?Musculoskeletal:  Positive for gait problem.  ?All other systems reviewed and are negative. ? ?Objective: ? ?Physical Exam ?Vitals reviewed.  ?Constitutional:   ?   Appearance: Normal appearance.  ?HENT:  ?   Head: Normocephalic and atraumatic.  ?Eyes:  ?   Extraocular Movements: Extraocular movements intact.  ?   Pupils: Pupils are equal, round, and reactive to light.  ?Cardiovascular:  ?   Rate and Rhythm: Normal rate and regular rhythm.  ?Pulmonary:  ?   Effort: Pulmonary effort is normal.  ?   Breath sounds: Normal breath sounds.  ?  Abdominal:  ?   Palpations: Abdomen is soft.  ?Musculoskeletal:  ?   Cervical back: Normal range of motion and neck supple.  ?   Left hip: Tenderness and bony tenderness present. Decreased range of motion. Decreased strength.  ?Neurological:  ?   Mental Status: She is alert and oriented to person, place, and time.  ?Psychiatric:     ?   Behavior: Behavior normal.  ? ? ?Vital signs in last 24 hours: ?Temp:  [98.1 ?F (36.7 ?C)] 98.1 ?F (36.7 ?C) (03/23 0827) ?Pulse Rate:  [116] 116 (03/23 0827) ?Resp:  [16] 16 (03/23 0827) ?BP: (122)/(95) 122/95 (03/23 0827) ?SpO2:  [96 %] 96 % (03/23 0827) ?Weight:  [65.8 kg] 65.8 kg (03/23 0827) ? ?Labs: ? ? ?Estimated body mass index is 24.89 kg/m? as calculated from the following: ?  Height as of 02/16/22: 5\' 4"  (1.626 m). ?  Weight as of 02/16/22: 65.8 kg. ? ? ?Imaging Review ?Plain radiographs demonstrate severe AVN of the left hip(s). The bone quality appears to be good for age and reported activity level. ? ? ? ? ? ?Assessment/Plan: ? ?Avascular necrosis, left hip(s) ? ?The patient history, physical examination, clinical judgement of the provider and imaging studies are consistent with end stage AVN of the left hip(s) and total hip  arthroplasty is deemed medically necessary. The treatment options including medical management, injection therapy, arthroscopy and arthroplasty were discussed at length. The risks and benefits of total hip arthro

## 2022-02-16 NOTE — Progress Notes (Signed)
?  Bowel prep reminder:NA ? ?PCP - Dr. Delia Chimes ?Cardiologist - no ? ?Chest x-ray - no ?EKG - 01/19/22-epic ?Stress Test - no ?ECHO - 2012-epic ?Cardiac Cath - no ?Pacemaker/ICD device last checked:NA ? ?Sleep Study - no ?CPAP -  ? ?Fasting Blood Sugar - NA ?Checks Blood Sugar _____ times a day ? ?Blood Thinner Instructions:NA ?Aspirin Instructions: ?Last Dose: ? ?Anesthesia review: yes ? ?Patient denies shortness of breath, fever, cough and chest pain at PAT appointment ?Pt has no SOB with any activities. She is in pain and her HR has been fast. She has PVCs with stress. She had a seizure on 01/15/22 because she wasn't taking her meds because she was sick. ? ?Patient verbalized understanding of instructions that were given to them at the PAT appointment. Patient was also instructed that they will need to review over the PAT instructions again at home before surgery. yes ?

## 2022-02-17 ENCOUNTER — Encounter (HOSPITAL_COMMUNITY): Payer: Self-pay | Admitting: Orthopaedic Surgery

## 2022-02-17 ENCOUNTER — Encounter (HOSPITAL_COMMUNITY)
Admission: RE | Disposition: A | Discharge status: Home / Self Care | Payer: Self-pay | Source: Home / Self Care | Attending: Orthopaedic Surgery

## 2022-02-17 ENCOUNTER — Ambulatory Visit (HOSPITAL_COMMUNITY): Payer: Commercial Managed Care - PPO | Admitting: Physician Assistant

## 2022-02-17 ENCOUNTER — Ambulatory Visit (HOSPITAL_COMMUNITY): Payer: Commercial Managed Care - PPO

## 2022-02-17 ENCOUNTER — Observation Stay (HOSPITAL_COMMUNITY): Payer: Commercial Managed Care - PPO

## 2022-02-17 ENCOUNTER — Ambulatory Visit (HOSPITAL_BASED_OUTPATIENT_CLINIC_OR_DEPARTMENT_OTHER): Payer: Commercial Managed Care - PPO | Admitting: Anesthesiology

## 2022-02-17 ENCOUNTER — Observation Stay (HOSPITAL_COMMUNITY)
Admission: RE | Admit: 2022-02-17 | Discharge: 2022-02-18 | Disposition: A | Discharge status: Home / Self Care | Payer: Commercial Managed Care - PPO | Attending: Orthopaedic Surgery | Admitting: Orthopaedic Surgery

## 2022-02-17 ENCOUNTER — Other Ambulatory Visit: Payer: Self-pay

## 2022-02-17 DIAGNOSIS — D696 Thrombocytopenia, unspecified: Secondary | ICD-10-CM | POA: Diagnosis not present

## 2022-02-17 DIAGNOSIS — J45909 Unspecified asthma, uncomplicated: Secondary | ICD-10-CM | POA: Diagnosis not present

## 2022-02-17 DIAGNOSIS — I10 Essential (primary) hypertension: Secondary | ICD-10-CM | POA: Diagnosis not present

## 2022-02-17 DIAGNOSIS — E782 Mixed hyperlipidemia: Secondary | ICD-10-CM | POA: Diagnosis not present

## 2022-02-17 DIAGNOSIS — M87052 Idiopathic aseptic necrosis of left femur: Principal | ICD-10-CM | POA: Insufficient documentation

## 2022-02-17 DIAGNOSIS — Z79899 Other long term (current) drug therapy: Secondary | ICD-10-CM | POA: Insufficient documentation

## 2022-02-17 DIAGNOSIS — Z96642 Presence of left artificial hip joint: Secondary | ICD-10-CM

## 2022-02-17 DIAGNOSIS — Z01818 Encounter for other preprocedural examination: Secondary | ICD-10-CM

## 2022-02-17 DIAGNOSIS — F17210 Nicotine dependence, cigarettes, uncomplicated: Secondary | ICD-10-CM | POA: Insufficient documentation

## 2022-02-17 DIAGNOSIS — M130 Polyarthritis, unspecified: Secondary | ICD-10-CM

## 2022-02-17 HISTORY — PX: TOTAL HIP ARTHROPLASTY: SHX124

## 2022-02-17 LAB — TYPE AND SCREEN
ABO/RH(D): O POS
Antibody Screen: NEGATIVE

## 2022-02-17 LAB — PREGNANCY, URINE: Preg Test, Ur: NEGATIVE

## 2022-02-17 SURGERY — ARTHROPLASTY, HIP, TOTAL, ANTERIOR APPROACH
Anesthesia: Monitor Anesthesia Care | Site: Hip | Laterality: Left

## 2022-02-17 MED ORDER — CLINDAMYCIN PHOSPHATE 900 MG/50ML IV SOLN
900.0000 mg | INTRAVENOUS | Status: DC
  Filled 2022-02-17: qty 50

## 2022-02-17 MED ORDER — ALUM & MAG HYDROXIDE-SIMETH 200-200-20 MG/5ML PO SUSP: 30.0000 mL | ORAL | Status: DC | PRN

## 2022-02-17 MED ORDER — DOCUSATE SODIUM 100 MG PO CAPS
100.0000 mg | ORAL_CAPSULE | Freq: Two times a day (BID) | ORAL | Status: DC
  Administered 2022-02-17 – 2022-02-18 (×2): 100 mg via ORAL
  Filled 2022-02-17 (×2): qty 1

## 2022-02-17 MED ORDER — TRANEXAMIC ACID-NACL 1000-0.7 MG/100ML-% IV SOLN
INTRAVENOUS | Status: AC
  Filled 2022-02-17: qty 100

## 2022-02-17 MED ORDER — ORAL CARE MOUTH RINSE
15.0000 mL | Freq: Once | OROMUCOSAL | Status: AC
  Administered 2022-02-17: 15 mL via OROMUCOSAL

## 2022-02-17 MED ORDER — ALPRAZOLAM 1 MG PO TABS
1.0000 mg | ORAL_TABLET | Freq: Three times a day (TID) | ORAL | Status: DC
  Administered 2022-02-17 – 2022-02-18 (×3): 1 mg via ORAL
  Filled 2022-02-17 (×3): qty 1

## 2022-02-17 MED ORDER — SODIUM CHLORIDE 0.9 % IV SOLN: INTRAVENOUS | Status: DC

## 2022-02-17 MED ORDER — ONDANSETRON HCL 4 MG/2ML IJ SOLN
INTRAMUSCULAR | Status: AC
  Filled 2022-02-17: qty 2

## 2022-02-17 MED ORDER — ACETAMINOPHEN 160 MG/5ML PO SOLN: 325.0000 mg | ORAL | Status: DC | PRN

## 2022-02-17 MED ORDER — SODIUM CHLORIDE 0.9 % IR SOLN
Status: DC | PRN
Start: 2022-02-17 — End: 2022-02-17
  Administered 2022-02-17: 1000 mL

## 2022-02-17 MED ORDER — PROPOFOL 1000 MG/100ML IV EMUL
INTRAVENOUS | Status: AC
  Filled 2022-02-17: qty 100

## 2022-02-17 MED ORDER — OXYCODONE HCL 5 MG PO TABS: 5.0000 mg | ORAL_TABLET | Freq: Once | ORAL | Status: DC | PRN

## 2022-02-17 MED ORDER — PROPOFOL 500 MG/50ML IV EMUL
INTRAVENOUS | Status: DC | PRN
Start: 2022-02-17 — End: 2022-02-17
  Administered 2022-02-17: 100 ug/kg/min via INTRAVENOUS

## 2022-02-17 MED ORDER — TRANEXAMIC ACID-NACL 1000-0.7 MG/100ML-% IV SOLN
1000.0000 mg | INTRAVENOUS | Status: AC
  Administered 2022-02-17: 1000 mg via INTRAVENOUS
  Filled 2022-02-17: qty 100

## 2022-02-17 MED ORDER — PANTOPRAZOLE SODIUM 40 MG PO TBEC
40.0000 mg | DELAYED_RELEASE_TABLET | Freq: Every day | ORAL | Status: DC
  Filled 2022-02-17: qty 1

## 2022-02-17 MED ORDER — BUPIVACAINE IN DEXTROSE 0.75-8.25 % IT SOLN
INTRATHECAL | Status: DC | PRN
  Administered 2022-02-17: 1.8 mL via INTRATHECAL

## 2022-02-17 MED ORDER — MIDAZOLAM HCL 2 MG/2ML IJ SOLN
INTRAMUSCULAR | Status: AC
  Filled 2022-02-17: qty 2

## 2022-02-17 MED ORDER — PHENOL 1.4 % MT LIQD: 1.0000 | OROMUCOSAL | Status: DC | PRN

## 2022-02-17 MED ORDER — ALBUTEROL SULFATE (2.5 MG/3ML) 0.083% IN NEBU: 2.5000 mg | INHALATION_SOLUTION | Freq: Four times a day (QID) | RESPIRATORY_TRACT | Status: DC | PRN

## 2022-02-17 MED ORDER — OXYCODONE HCL 5 MG/5ML PO SOLN: 5.0000 mg | Freq: Once | ORAL | Status: DC | PRN

## 2022-02-17 MED ORDER — MIDAZOLAM HCL 2 MG/2ML IJ SOLN
INTRAMUSCULAR | Status: DC | PRN
  Administered 2022-02-17: 2 mg via INTRAVENOUS

## 2022-02-17 MED ORDER — ALBUTEROL SULFATE (2.5 MG/3ML) 0.083% IN NEBU
INHALATION_SOLUTION | RESPIRATORY_TRACT | Status: AC
  Filled 2022-02-17: qty 3

## 2022-02-17 MED ORDER — CEFAZOLIN SODIUM-DEXTROSE 2-4 GM/100ML-% IV SOLN
INTRAVENOUS | Status: AC
  Filled 2022-02-17: qty 100

## 2022-02-17 MED ORDER — ONDANSETRON HCL 4 MG/2ML IJ SOLN: 4.0000 mg | Freq: Once | INTRAMUSCULAR | Status: DC | PRN

## 2022-02-17 MED ORDER — 0.9 % SODIUM CHLORIDE (POUR BTL) OPTIME
TOPICAL | Status: DC | PRN
  Administered 2022-02-17: 1000 mL

## 2022-02-17 MED ORDER — POVIDONE-IODINE 10 % EX SWAB
2.0000 "application " | Freq: Once | CUTANEOUS | Status: AC
  Administered 2022-02-17: 2 via TOPICAL

## 2022-02-17 MED ORDER — OXYCODONE HCL 5 MG PO TABS
5.0000 mg | ORAL_TABLET | ORAL | Status: DC | PRN
  Administered 2022-02-17 (×2): 10 mg via ORAL
  Filled 2022-02-17 (×3): qty 2

## 2022-02-17 MED ORDER — ACETAMINOPHEN 325 MG PO TABS: 325.0000 mg | ORAL_TABLET | ORAL | Status: DC | PRN

## 2022-02-17 MED ORDER — IRBESARTAN 150 MG PO TABS
150.0000 mg | ORAL_TABLET | Freq: Every day | ORAL | Status: DC
Start: 2022-02-17 — End: 2022-02-18
  Administered 2022-02-18: 150 mg via ORAL
  Filled 2022-02-17: qty 1

## 2022-02-17 MED ORDER — METHOCARBAMOL 1000 MG/10ML IJ SOLN
500.0000 mg | Freq: Four times a day (QID) | INTRAVENOUS | Status: DC | PRN
  Filled 2022-02-17: qty 5

## 2022-02-17 MED ORDER — STERILE WATER FOR IRRIGATION IR SOLN
Status: DC | PRN
  Administered 2022-02-17: 2000 mL

## 2022-02-17 MED ORDER — MEPERIDINE HCL 50 MG/ML IJ SOLN: 6.2500 mg | INTRAMUSCULAR | Status: DC | PRN

## 2022-02-17 MED ORDER — PROPOFOL 10 MG/ML IV BOLUS
INTRAVENOUS | Status: DC | PRN
  Administered 2022-02-17: 50 mg via INTRAVENOUS

## 2022-02-17 MED ORDER — MENTHOL 3 MG MT LOZG: 1.0000 | LOZENGE | OROMUCOSAL | Status: DC | PRN

## 2022-02-17 MED ORDER — PHENYLEPHRINE 40 MCG/ML (10ML) SYRINGE FOR IV PUSH (FOR BLOOD PRESSURE SUPPORT)
PREFILLED_SYRINGE | INTRAVENOUS | Status: DC | PRN
  Administered 2022-02-17 (×2): 120 ug via INTRAVENOUS
  Administered 2022-02-17 (×3): 160 ug via INTRAVENOUS

## 2022-02-17 MED ORDER — FENTANYL CITRATE (PF) 100 MCG/2ML IJ SOLN
INTRAMUSCULAR | Status: DC | PRN
  Administered 2022-02-17: 100 ug via INTRAVENOUS

## 2022-02-17 MED ORDER — DEXAMETHASONE SODIUM PHOSPHATE 10 MG/ML IJ SOLN
INTRAMUSCULAR | Status: AC
  Filled 2022-02-17: qty 1

## 2022-02-17 MED ORDER — FENTANYL CITRATE PF 50 MCG/ML IJ SOSY: 25.0000 ug | PREFILLED_SYRINGE | INTRAMUSCULAR | Status: DC | PRN

## 2022-02-17 MED ORDER — FENTANYL CITRATE (PF) 100 MCG/2ML IJ SOLN
INTRAMUSCULAR | Status: AC
  Filled 2022-02-17: qty 2

## 2022-02-17 MED ORDER — ONDANSETRON HCL 4 MG/2ML IJ SOLN: 4.0000 mg | Freq: Four times a day (QID) | INTRAMUSCULAR | Status: DC | PRN

## 2022-02-17 MED ORDER — DEXAMETHASONE SODIUM PHOSPHATE 10 MG/ML IJ SOLN
INTRAMUSCULAR | Status: DC | PRN
  Administered 2022-02-17: 10 mg via INTRAVENOUS

## 2022-02-17 MED ORDER — ASPIRIN 81 MG PO CHEW
81.0000 mg | CHEWABLE_TABLET | Freq: Two times a day (BID) | ORAL | Status: DC
  Administered 2022-02-17 – 2022-02-18 (×2): 81 mg via ORAL
  Filled 2022-02-17 (×2): qty 1

## 2022-02-17 MED ORDER — PHENYLEPHRINE 40 MCG/ML (10ML) SYRINGE FOR IV PUSH (FOR BLOOD PRESSURE SUPPORT)
PREFILLED_SYRINGE | INTRAVENOUS | Status: AC
  Filled 2022-02-17: qty 10

## 2022-02-17 MED ORDER — GABAPENTIN 400 MG PO CAPS
1200.0000 mg | ORAL_CAPSULE | Freq: Four times a day (QID) | ORAL | Status: DC
  Administered 2022-02-17 – 2022-02-18 (×3): 1200 mg via ORAL
  Filled 2022-02-17 (×4): qty 3

## 2022-02-17 MED ORDER — ONDANSETRON HCL 4 MG/2ML IJ SOLN
INTRAMUSCULAR | Status: DC | PRN
  Administered 2022-02-17: 4 mg via INTRAVENOUS

## 2022-02-17 MED ORDER — HYDROCHLOROTHIAZIDE 12.5 MG PO TABS
12.5000 mg | ORAL_TABLET | Freq: Every day | ORAL | Status: DC
  Administered 2022-02-18: 12.5 mg via ORAL
  Filled 2022-02-17: qty 1

## 2022-02-17 MED ORDER — CEFAZOLIN SODIUM-DEXTROSE 2-3 GM-%(50ML) IV SOLR
INTRAVENOUS | Status: DC | PRN
  Administered 2022-02-17: 2 g via INTRAVENOUS

## 2022-02-17 MED ORDER — TELMISARTAN-HCTZ 40-12.5 MG PO TABS: 1.0000 | ORAL_TABLET | Freq: Every day | ORAL | Status: DC

## 2022-02-17 MED ORDER — ONDANSETRON HCL 4 MG PO TABS: 4.0000 mg | ORAL_TABLET | Freq: Four times a day (QID) | ORAL | Status: DC | PRN

## 2022-02-17 MED ORDER — FLUOXETINE HCL 20 MG PO CAPS
80.0000 mg | ORAL_CAPSULE | Freq: Every day | ORAL | Status: DC
  Administered 2022-02-18: 80 mg via ORAL
  Filled 2022-02-17: qty 4

## 2022-02-17 MED ORDER — CHLORHEXIDINE GLUCONATE 0.12 % MT SOLN: 15.0000 mL | Freq: Once | OROMUCOSAL | Status: AC

## 2022-02-17 MED ORDER — LACTATED RINGERS IV SOLN: INTRAVENOUS | Status: DC

## 2022-02-17 MED ORDER — OXYCODONE HCL 5 MG PO TABS
10.0000 mg | ORAL_TABLET | ORAL | Status: DC | PRN
  Administered 2022-02-18: 10 mg via ORAL
  Administered 2022-02-18: 15 mg via ORAL
  Administered 2022-02-18: 10 mg via ORAL
  Filled 2022-02-17: qty 2
  Filled 2022-02-17: qty 3

## 2022-02-17 MED ORDER — DIPHENHYDRAMINE HCL 12.5 MG/5ML PO ELIX
12.5000 mg | ORAL_SOLUTION | ORAL | Status: DC | PRN
  Administered 2022-02-17: 25 mg via ORAL
  Filled 2022-02-17: qty 10

## 2022-02-17 MED ORDER — ALBUTEROL SULFATE (2.5 MG/3ML) 0.083% IN NEBU
2.5000 mg | INHALATION_SOLUTION | Freq: Once | RESPIRATORY_TRACT | Status: AC
  Administered 2022-02-17: 2.5 mg via RESPIRATORY_TRACT

## 2022-02-17 MED ORDER — HYDROMORPHONE HCL 1 MG/ML IJ SOLN: 0.5000 mg | INTRAMUSCULAR | Status: DC | PRN

## 2022-02-17 MED ORDER — METOCLOPRAMIDE HCL 5 MG PO TABS: 5.0000 mg | ORAL_TABLET | Freq: Three times a day (TID) | ORAL | Status: DC | PRN

## 2022-02-17 MED ORDER — ALBUTEROL SULFATE HFA 108 (90 BASE) MCG/ACT IN AERS: 1.0000 | INHALATION_SPRAY | Freq: Four times a day (QID) | RESPIRATORY_TRACT | Status: DC | PRN

## 2022-02-17 MED ORDER — METHOCARBAMOL 500 MG PO TABS
500.0000 mg | ORAL_TABLET | Freq: Four times a day (QID) | ORAL | Status: DC | PRN
  Administered 2022-02-17 – 2022-02-18 (×3): 500 mg via ORAL
  Filled 2022-02-17 (×3): qty 1

## 2022-02-17 MED ORDER — ACETAMINOPHEN 325 MG PO TABS
325.0000 mg | ORAL_TABLET | Freq: Four times a day (QID) | ORAL | Status: DC | PRN
  Administered 2022-02-17: 650 mg via ORAL
  Filled 2022-02-17 (×2): qty 2

## 2022-02-17 MED ORDER — LEVETIRACETAM 500 MG PO TABS
500.0000 mg | ORAL_TABLET | Freq: Two times a day (BID) | ORAL | Status: DC
  Administered 2022-02-17 – 2022-02-18 (×2): 500 mg via ORAL
  Filled 2022-02-17 (×2): qty 1

## 2022-02-17 MED ORDER — HYDRALAZINE HCL 25 MG PO TABS
25.0000 mg | ORAL_TABLET | Freq: Three times a day (TID) | ORAL | Status: DC
  Administered 2022-02-17 – 2022-02-18 (×2): 25 mg via ORAL
  Filled 2022-02-17 (×2): qty 1

## 2022-02-17 MED ORDER — CEFAZOLIN SODIUM-DEXTROSE 1-4 GM/50ML-% IV SOLN
1.0000 g | Freq: Four times a day (QID) | INTRAVENOUS | Status: AC
  Administered 2022-02-17 (×2): 1 g via INTRAVENOUS
  Filled 2022-02-17 (×2): qty 50

## 2022-02-17 MED ORDER — METOCLOPRAMIDE HCL 5 MG/ML IJ SOLN: 5.0000 mg | Freq: Three times a day (TID) | INTRAMUSCULAR | Status: DC | PRN

## 2022-02-17 SURGICAL SUPPLY — 45 items
APL SKNCLS STERI-STRIP NONHPOA (GAUZE/BANDAGES/DRESSINGS)
BAG COUNTER SPONGE SURGICOUNT (BAG) ×2 IMPLANT
BAG SPEC THK2 15X12 ZIP CLS (MISCELLANEOUS)
BAG SPNG CNTER NS LX DISP (BAG) ×1
BAG ZIPLOCK 12X15 (MISCELLANEOUS) IMPLANT
BENZOIN TINCTURE PRP APPL 2/3 (GAUZE/BANDAGES/DRESSINGS) IMPLANT
BLADE SAW SGTL 18X1.27X75 (BLADE) ×2 IMPLANT
COVER PERINEAL POST (MISCELLANEOUS) ×2 IMPLANT
COVER SURGICAL LIGHT HANDLE (MISCELLANEOUS) ×2 IMPLANT
CUP SECTOR GRIPTON 50MM (Cup) ×1 IMPLANT
DRAPE FOOT SWITCH (DRAPES) ×2 IMPLANT
DRAPE STERI IOBAN 125X83 (DRAPES) ×2 IMPLANT
DRAPE U-SHAPE 47X51 STRL (DRAPES) ×4 IMPLANT
DRSG AQUACEL AG ADV 3.5X10 (GAUZE/BANDAGES/DRESSINGS) ×2 IMPLANT
DURAPREP 26ML APPLICATOR (WOUND CARE) ×2 IMPLANT
ELECT REM PT RETURN 15FT ADLT (MISCELLANEOUS) ×2 IMPLANT
GAUZE XEROFORM 1X8 LF (GAUZE/BANDAGES/DRESSINGS) ×2 IMPLANT
GLOVE SRG 8 PF TXTR STRL LF DI (GLOVE) ×2 IMPLANT
GLOVE SURG ENC MOIS LTX SZ7.5 (GLOVE) ×2 IMPLANT
GLOVE SURG NEOPR MICRO LF SZ8 (GLOVE) ×2 IMPLANT
GLOVE SURG UNDER POLY LF SZ8 (GLOVE) ×4
GOWN STRL REUS W/ TWL XL LVL3 (GOWN DISPOSABLE) ×2 IMPLANT
GOWN STRL REUS W/TWL XL LVL3 (GOWN DISPOSABLE) ×4
HANDPIECE INTERPULSE COAX TIP (DISPOSABLE) ×2
HEAD FEMORAL 32 CERAMIC (Hips) ×1 IMPLANT
HOLDER FOLEY CATH W/STRAP (MISCELLANEOUS) ×2 IMPLANT
KIT TURNOVER KIT A (KITS) IMPLANT
LINER ACETABULAR 32X50 (Liner) ×1 IMPLANT
PACK ANTERIOR HIP CUSTOM (KITS) ×2 IMPLANT
SCREW 6.5MMX25MM (Screw) ×1 IMPLANT
SET HNDPC FAN SPRY TIP SCT (DISPOSABLE) ×1 IMPLANT
SPONGE T-LAP 18X18 ~~LOC~~+RFID (SPONGE) ×6 IMPLANT
STAPLER VISISTAT 35W (STAPLE) IMPLANT
STEM FEM ACTIS STD SZ1 (Stem) ×1 IMPLANT
STRIP CLOSURE SKIN 1/2X4 (GAUZE/BANDAGES/DRESSINGS) IMPLANT
SUT ETHIBOND NAB CT1 #1 30IN (SUTURE) ×2 IMPLANT
SUT ETHILON 2 0 PS N (SUTURE) IMPLANT
SUT MNCRL AB 4-0 PS2 18 (SUTURE) IMPLANT
SUT VIC AB 0 CT1 36 (SUTURE) ×2 IMPLANT
SUT VIC AB 1 CT1 36 (SUTURE) ×2 IMPLANT
SUT VIC AB 2-0 CT1 27 (SUTURE) ×4
SUT VIC AB 2-0 CT1 TAPERPNT 27 (SUTURE) ×2 IMPLANT
TRAY FOLEY MTR SLVR 16FR STAT (SET/KITS/TRAYS/PACK) IMPLANT
TRAY FOLEY W/BAG SLVR 14FR LF (SET/KITS/TRAYS/PACK) ×1 IMPLANT
YANKAUER SUCT BULB TIP NO VENT (SUCTIONS) ×2 IMPLANT

## 2022-02-17 NOTE — Brief Op Note (Signed)
02/17/2022 ? ?11:25 AM ? ?PATIENT:  Kelsey [name]  41 y.o. female ? ?PRE-OPERATIVE DIAGNOSIS:  Avascular necrosis left hip ? ?POST-OPERATIVE DIAGNOSIS:  Avascular necrosis left hip ? ?PROCEDURE:  Procedure(s): ?Left TOTAL HIP ARTHROPLASTY ANTERIOR APPROACH (Left) ? ?SURGEON:  Surgeon(s) and Role: ?   Kathryne Hitch, MD - Primary ? ?PHYSICIAN ASSISTANT:  Rexene Edison, PA-C ? ?ANESTHESIA:   spinal ? ?EBL:  200 mL  ? ?COUNTS:  YES ? ?DICTATION: .Other Dictation: Dictation Number [id number] ? ?PLAN OF CARE: Admit for overnight observation ? ?PATIENT DISPOSITION:  PACU - hemodynamically stable. ?  ?Delay start of Pharmacological VTE agent (>24hrs) due to surgical blood loss or risk of bleeding: no ? ?

## 2022-02-17 NOTE — Interval H&P Note (Signed)
History and Physical Interval Note: The patient understands that she is here for a left hip replacement to treat her left hip avascular necrosis.  There has been no acute or interval change in her medical status.  Please see recent H&P.  The risks and benefits of surgery been discussed in detail and informed consent is obtained.  The left operative hip has been marked. ? ?02/17/2022 ?8:30 AM ? ?[name]  has presented today for surgery, with the diagnosis of Avascular necrosis left hip.  The various methods of treatment have been discussed with the patient and family. After consideration of risks, benefits and other options for treatment, the patient has consented to  Procedure(s): ?Left TOTAL HIP ARTHROPLASTY ANTERIOR APPROACH (Left) as a surgical intervention.  The patient's history has been reviewed, patient examined, no change in status, stable for surgery.  I have reviewed the patient's chart and labs.  Questions were answered to the patient's satisfaction.   ? ? ?Kathryne Hitch ? ? ?

## 2022-02-17 NOTE — Anesthesia Procedure Notes (Signed)
Spinal ? ?Patient location during procedure: OR ?Start time: 02/17/2022 10:16 AM ?Reason for block: surgical anesthesia ?Staffing ?Performed: resident/CRNA  ?Resident/CRNA: [place] Haadi Santellan C, CRNA ?Preanesthetic Checklist ?Completed: patient identified, IV checked, site marked, risks and benefits discussed, surgical consent, monitors and equipment checked, pre-op evaluation and timeout performed ?Spinal Block ?Patient position: sitting ?Prep: DuraPrep ?Patient monitoring: heart rate, cardiac monitor, continuous pulse ox and blood pressure ?Approach: midline ?Location: L3-4 ?Injection technique: single-shot ?Needle ?Needle type: Pencan  ?Needle gauge: 24 G ?Needle length: 9 cm ?Assessment ?Sensory level: T4 ?Events: CSF return ?Additional Notes ?IV functioning, monitors applied to pt. Expiration date of kit checked and confirmed to be in date. Sterile prep and drape, hand hygiene and sterile gloved used. Pt was positioned and spine was prepped in sterile fashion. Skin was anesthetized with lidocaine. Free flow of clear CSF obtained prior to injecting local anesthetic into CSF x 1 attempt. Spinal needle aspirated freely following injection. Needle was carefully withdrawn, and pt tolerated procedure well. Loss of motor and sensory on exam post injection.  ? ? ? ?

## 2022-02-17 NOTE — Transfer of Care (Signed)
Immediate Anesthesia Transfer of Care Note ? ?Patient: Kelsey [name] ? ?Procedure(s) Performed: Left TOTAL HIP ARTHROPLASTY ANTERIOR APPROACH (Left: Hip) ? ?Patient Location: PACU ? ?Anesthesia Type:Spinal ? ?Level of Consciousness: awake ? ?Airway & Oxygen Therapy: Patient Spontanous Breathing and Patient connected to face mask oxygen ? ?Post-op Assessment: Report given to RN and Post -op Vital signs reviewed and stable ? ?Post vital signs: Reviewed and stable ? ?Last Vitals:  ?Vitals Value Taken Time  ?BP    ?Temp    ?Pulse 83 02/17/22 1145  ?Resp    ?SpO2 100 % 02/17/22 1145  ?Vitals shown include unvalidated device data. ? ?Last Pain:  ?Vitals:  ? 02/17/22 0755  ?TempSrc: Oral  ?   ? ?  ? ?Complications: No notable events documented. ?

## 2022-02-17 NOTE — Evaluation (Signed)
Physical Therapy Evaluation ?Patient Details ?Name: Kelsey RailJennifer L Grinder ?MRN: [id number] ?DOB: [birth year] ?Today's Date: 02/17/2022 ? ?History of Present Illness ? Pt is a 41yo female presenting s/p L THA anterior approach. PMH: anxiety/depression, GERD, HTN, PVC, seizures, tachycardia. ?  ?Clinical Impression ? Pt is s/p L-THA resulting in the deficits listed below (see PT Problem List). Pt min guard for all mobility tasks today, though did demonstrate minor LOB while sitting into recliner but PT and pt were able to assist into recliner safely via gait belt. Provided HEP for circulation and ROM, pt demonstrated safe form and technique. Pt will benefit from skilled PT to increase their independence and safety with mobility to allow discharge to the venue listed below.  ?   ?   ? ?Recommendations for follow up therapy are one component of a multi-disciplinary discharge planning process, led by the attending physician.  Recommendations may be updated based on patient status, additional functional criteria and insurance authorization. ? ?Follow Up Recommendations Follow physician's recommendations for discharge plan and follow up therapies ? ?  ?Assistance Recommended at Discharge Intermittent Supervision/Assistance  ?Patient can return home with the following ? A little help with walking and/or transfers;A little help with bathing/dressing/bathroom;Assistance with cooking/housework;Assist for transportation;Help with stairs or ramp for entrance ? ?  ?Equipment Recommendations Rolling walker (2 wheels)  ?Recommendations for Other Services ?    ?  ?Functional Status Assessment Patient has had a recent decline in their functional status and demonstrates the ability to make significant improvements in function in a reasonable and predictable amount of time.  ? ?  ?Precautions / Restrictions Precautions ?Precautions: Fall ?Restrictions ?Weight Bearing Restrictions: No ?LLE Weight Bearing: Weight bearing as tolerated  ? ?   ? ?Mobility ? Bed Mobility ?Overal bed mobility: Needs Assistance ?Bed Mobility: Supine to Sit ?  ?  ?Supine to sit: Min guard ?  ?  ?General bed mobility comments: Pt min guard for safety only, no physical assist required. VC for sequencing and hand placement. ?  ? ?Transfers ?Overall transfer level: Needs assistance ?Equipment used: Rolling walker (2 wheels) ?Transfers: Sit to/from Stand ?Sit to Stand: Min guard ?  ?  ?  ?  ?  ?General transfer comment: VC for sequencing and hand placement. Pt had one unsuccessful attempt to rise but on second attempt was able to power up to standing. No overt LOB. Upon stand to sit transfer into recliner pt did slide down quicker than she intended and had to be safely caught and returned to sitting by PT via gait belt. ?  ? ?Ambulation/Gait ?Ambulation/Gait assistance: Min guard, +2 safety/equipment ?Gait Distance (Feet): 20 Feet ?Assistive device: Rolling walker (2 wheels) ?Gait Pattern/deviations: Step-to pattern, Decreased step length - right, Decreased step length - left, Decreased weight shift to left, Knees buckling, Drifts right/left, Narrow base of support ?Gait velocity: decreased ?  ?  ?General Gait Details: Pt ambulated with RW, min guard assist with +2 for IV pole and recliner for safety only. Demonstrated mildly antalgic gait pattern consisten with s/p L-THA: decreased step length, narrow BOS, and some staggering left and right. Pt had mild LOB to right side but was able to catch herself on RW, PT did not assist. ? ?Stairs ?  ?  ?  ?  ?  ? ?Wheelchair Mobility ?  ? ?Modified Rankin (Stroke Patients Only) ?  ? ?  ? ?Balance Overall balance assessment: Needs assistance ?Sitting-balance support: Feet supported, No upper extremity supported ?Sitting balance-Leahy Scale: Good ?  ?  ?  Standing balance support: Reliant on assistive device for balance, During functional activity, Bilateral upper extremity supported ?Standing balance-Leahy Scale: Poor ?Standing balance  comment: Pt reliant on BUE support on RW during functional mobility tasks and did have minor LOB but was able to self-correct. ?  ?  ?  ?  ?  ?  ?  ?  ?  ?  ?  ?   ? ? ? ?Pertinent Vitals/Pain Pain Assessment ?Pain Assessment: No/denies pain  ? ? ?Home Living Family/patient expects to be discharged to:: Private residence ?Living Arrangements: Spouse/significant other;Children ?Available Help at Discharge: Family;Available 24 hours/day ?Type of Home: House ?Home Access: Stairs to enter ?Entrance Stairs-Rails: None ?Entrance Stairs-Number of Steps: 1 ?  ?Home Layout: One level ?Home Equipment: Grab bars - tub/shower;Shower seat;Toilet riser;Cane - single point ?   ?  ?Prior Function Prior Level of Function : Independent/Modified Independent ?  ?  ?  ?  ?  ?  ?Mobility Comments: SPC especially for longer distance ?ADLs Comments: IND ?  ? ? ?Hand Dominance  ? Dominant Hand: Right ? ?  ?Extremity/Trunk Assessment  ? Upper Extremity Assessment ?Upper Extremity Assessment: Overall WFL for tasks assessed ?  ? ?Lower Extremity Assessment ?Lower Extremity Assessment: RLE deficits/detail;LLE deficits/detail ?RLE Deficits / Details: MMT: ank df/pf 4+/5 ?RLE Sensation: WNL ?LLE Deficits / Details: MMT: ank df/pf 4+/5, good quad activation. Pt demonstrates excessive plantarflexion ROM. ?LLE Sensation: WNL ?  ? ?Cervical / Trunk Assessment ?Cervical / Trunk Assessment: Normal  ?Communication  ? Communication: No difficulties  ?Cognition Arousal/Alertness: Awake/alert ?Behavior During Therapy: [facility] for tasks assessed/performed ?Overall Cognitive Status: Within Functional Limits for tasks assessed ?  ?  ?  ?  ?  ?  ?  ?  ?  ?  ?  ?  ?  ?  ?  ?  ?  ?  ?  ? ?  ?General Comments   ? ?  ?Exercises Total Joint Exercises ?Ankle Circles/Pumps: AROM, 20 reps, Both ?Quad Sets: AROM, Both, 5 reps ?Short Arc Quad: AROM, Left, 5 reps ?Heel Slides: AAROM, 10 reps, Left ?Hip ABduction/ADduction: AAROM, Left, 10 reps  ? ?Assessment/Plan  ?  ?PT  Assessment Patient needs continued PT services  ?PT Problem List Decreased strength;Decreased range of motion;Decreased activity tolerance;Decreased balance;Decreased mobility;Decreased coordination;Decreased knowledge of use of DME;Decreased safety awareness;Pain ? ?   ?  ?PT Treatment Interventions DME instruction;Gait training;Stair training;Functional mobility training;Therapeutic activities;Therapeutic exercise;Balance training;Neuromuscular re-education;Patient/family education   ? 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 ?  ?Frequency 7X/week ?  ? ? ?Co-evaluation   ?  ?  ?  ?  ? ? ?  ?AM-PAC PT "6 Clicks" Mobility  ?Outcome Measure Help needed turning from your back to your side while in a flat bed without using bedrails?: A Little ?Help needed moving from lying on your back to sitting on the side of a flat bed without using bedrails?: A Little ?Help needed moving to and from a bed to a chair (including a wheelchair)?: A Little ?Help needed standing up from a chair using your arms (e.g., wheelchair or bedside chair)?: A Little ?Help needed to walk in hospital room?: A Little ?Help needed climbing 3-5 steps with a railing? : A Little ?  6 Click Score: 18 ? ?  ?End of Session Equipment Utilized During Treatment: Gait belt ?Activity Tolerance: Patient tolerated treatment well ?Patient left: in chair;with call bell/phone within reach;with family/visitor present (Husband Dannielle Huh) ?Nurse Communication: Mobility status ?PT Visit Diagnosis: Unsteadiness on feet (R26.81);Difficulty in walking, not elsewhere classified (R26.2) ?  ? ?Time: 5809-9833 ?PT Time Calculation (min) (ACUTE ONLY): 37 min ? ? ?Charges:   PT Evaluation ?$PT Eval Low Complexity: 1 Low ?PT  Treatments ?$Therapeutic Activity: 8-22 mins ?  ?   ? ? ?Jamesetta Geralds, PT, DPT ?[facility] ?Office: [phone] ?Pager: [phone] ? ? ?Jamesetta Geralds ?02/17/2022, 3:10 PM ? ?

## 2022-02-17 NOTE — Anesthesia Postprocedure Evaluation (Signed)
Anesthesia Post Note ? ?Patient: Kelsey [name] ? ?Procedure(s) Performed: Left TOTAL HIP ARTHROPLASTY ANTERIOR APPROACH (Left: Hip) ? ?  ? ?Patient location during evaluation: PACU ?Anesthesia Type: MAC and Spinal ?Level of consciousness: awake and alert ?Pain management: pain level controlled ?Vital Signs Assessment: post-procedure vital signs reviewed and stable ?Respiratory status: spontaneous breathing and respiratory function stable ?Cardiovascular status: blood pressure returned to baseline and stable ?Postop Assessment: spinal receding ?Anesthetic complications: no ? ? ?No notable events documented. ? ?Last Vitals:  ?Vitals:  ? 02/17/22 1200 02/17/22 1215  ?BP: 101/69 113/80  ?Pulse: 84 85  ?Resp: 16 20  ?Temp:    ?SpO2: 97% 93%  ?  ?Last Pain:  ?Vitals:  ? 02/17/22 1215  ?TempSrc:   ?PainSc: 0-No pain  ? ? ?LLE Motor Response: No movement due to regional block (02/17/22 1215) ?LLE Sensation: No sensation (absent) (02/17/22 1215) ?RLE Motor Response: No movement due to regional block (02/17/22 1215) ?RLE Sensation: No sensation (absent) (02/17/22 1215) ?L Sensory Level: T12-Inguinal (groin) region (02/17/22 1215) ?R Sensory Level: T12-Inguinal (groin) region (02/17/22 1215) ? ?[name] ? ? ? ? ?

## 2022-02-17 NOTE — Anesthesia Preprocedure Evaluation (Addendum)
Anesthesia Evaluation  ?Patient identified by MRN, date of birth, ID band ?Patient awake ? ? ? ?Reviewed: ?Allergy & Precautions, H&P , NPO status , Patient's Chart, lab work & pertinent test results ? ?Airway ?Mallampati: III ? ?TM Distance: <3 FB ?Neck ROM: Full ? ? ? Dental ?no notable dental hx. ?(+) Teeth Intact, Dental Advisory Given ?  ?Pulmonary ?asthma , Current Smoker, former smoker,  ?  ?Pulmonary exam normal ?breath sounds clear to auscultation ? ? ? ? ? ? Cardiovascular ?hypertension, + dysrhythmias  ?Rhythm:Regular Rate:Normal ? ? ?  ?Neuro/Psych ?Seizures -, Well Controlled,  Anxiety Depression   ? GI/Hepatic ?Neg liver ROS, GERD  Medicated and Controlled,  ?Endo/Other  ?negative endocrine ROS ? Renal/GU ?negative Renal ROS  ?negative genitourinary ?  ?Musculoskeletal ? ?(+) Arthritis , Osteoarthritis,   ? Abdominal ?  ?Peds ? Hematology ?negative hematology ROS ?(+)   ?Anesthesia Other Findings ? ? Reproductive/Obstetrics ?negative OB ROS ? ?  ? ? ? ? ? ? ? ? ? ? ? ? ? ?  ?  ? ? ? ? ? ? ? ?Anesthesia Physical ? ?Anesthesia Plan ? ?ASA: 2 ? ?Anesthesia Plan: MAC and Spinal  ? ?Post-op Pain Management: Minimal or no pain anticipated  ? ?Induction: Intravenous ? ?PONV Risk Score and Plan: 1 and Propofol infusion ? ?Airway Management Planned: Natural Airway, Nasal Cannula and Simple Face Mask ? ?Additional Equipment: None ? ?Intra-op Plan:  ? ?Post-operative Plan: Extubation in OR ? ?Informed Consent: I have reviewed the patients History and Physical, chart, labs and discussed the procedure including the risks, benefits and alternatives for the proposed anesthesia with the patient or authorized representative who has indicated his/her understanding and acceptance.  ? ? ? ?Dental advisory given ? ?Plan Discussed with: CRNA and Anesthesiologist ? ?Anesthesia Plan Comments:   ? ? ? ? ? ?Anesthesia Quick Evaluation ? ?

## 2022-02-17 NOTE — Op Note (Signed)
NAME: Mesick, Dorothee L. ?MEDICAL RECORD NO: [id number] ?ACCOUNT NO: [id number] ?DATE OF BIRTH: 09-22-1981 ?FACILITY: [facility] ?LOCATION: [facility] ?PHYSICIAN: Lind Guest. Ninfa Linden, MD ? ?Operative Report  ? ?DATE OF PROCEDURE: 02/17/2022 ? 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 ? 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 ? ?PROCEDURE:  Left total hip arthroplasty through direct anterior approach. ? ?IMPLANTS:  DePuy sector Gription acetabular component size 50 and a single screw, size 32+0 neutral polyethylene liner, size 1 ACTIS femoral component with standard offset, size 32+1 ceramic hip ball. ? ?SURGEON:  Lind Guest. Ninfa Linden, MD ? ?ASSISTANT:  Erskine Emery, PA-C. ? ?ANESTHESIA:  Spinal. ? ?ANTIBIOTICS:  2 g IV Ancef. ? ?ESTIMATED BLOOD LOSS:  200 mL ? ?COMPLICATIONS:  None. ? ?INDICATIONS:  The patient is a 41 year old female who unfortunately has bilateral hip avascular necrosis, which was quite severe on the left side.  She has experienced femoral head collapse.  This is idiopathic AVN.  At this point, her pain is severe and ? is detrimentally affecting her mobility, her quality of life and activities of daily living and we do feel the need to proceed with the hip replacement on the left hip and she agrees with this as well, given her x-ray findings, MRI findings and her  ?decreased mobility and severe pain with her hip.  We did talk about the risk of acute blood loss anemia, nerve or vessel injury, fracture, infection, dislocation, DVT and implant failure, leg length differences and skin and soft tissues issues.  She  ?understands our goals are to decrease pain, improve mobility and overall improve quality of life. ? ?DESCRIPTION OF PROCEDURE:  After informed consent was obtained, appropriate left hip was marked.  She was brought to the operating room and sat up on the stretcher where spinal anesthesia was  obtained.  She was laid in supine position on the stretcher.   ?A Foley catheter was placed and traction boots were placed on both her feet.  Next, she was placed supine on the Hana fracture table, the perineal post in place and both legs in line with skeletal traction device and no traction applied.  Her left  ?operative hip was prepped and draped with DuraPrep and sterile drapes.  A timeout was called.  She was identified correct patient, correct left hip.  I then made an incision just inferior and posterior to the anterior superior iliac spine and carried  ?this obliquely down the leg.  We dissected down tensor fascia lata muscle.  Tensor fascia was then divided longitudinally to proceed with direct anterior approach to the hip.  We identified and cauterized circumflex vessels and then identified the hip  ?capsule, opened the hip capsule in L-type format finding a moderate joint effusion and significant cartilage that was breaking off from the femoral head.  We placed Cobra retractors in the medial and lateral femoral neck and made a femoral neck cut with  ?an oscillating saw just proximal to the lesser trochanter and completed this with an osteotome. We placed a corkscrew guide in the femoral head and removed the femoral head in its entirety and the AVM was severe.  The femoral head was collapsed and  ?flattened.  We then placed a bent Hohmann over the medial acetabular rim and removed remnants of acetabular labrum and other debris.  I then began reaming under direct visualization from a size 43 reamer in a stepwise increments going up to a size  49  ?reamer with all reamers placed under direct visualization, the last reamer was placed under direct fluoroscopy, so I could obtain my depth of reaming, my inclination and anteversion.  I then placed real DePuy sector Gription acetabular component size 50  ?in a single screw.  We went with a 36+0 neutral polyethylene liner for that size acetabular component.   Attention was then turned to the femur.  With the leg externally rotated to 120 degrees and extended adducted we were able to place a Mueller  ?retractor medially and a Hohmann retractor above the greater trochanter. We released the lateral joint capsule and used a box cutting osteotome to enter femoral canal and a rongeur to lateralize, we then began broaching using the ACTIS broaching system  ?from a size 0, going all the size 1 because of her small canal. With a size 1 in place, we trialed standard offset femoral neck and a 32+1 hip ball, reduced this in the pelvis, we were pleased with leg length, range of motion, offset and stability  ?assessed mechanically and radiographically.  We then dislocated the hip and removed the trial components.  We placed the real ACTIS femoral component with standard offset size 1 and the real 32+1 ceramic hip ball and again reduced this in acetabulum and  ?it was felt to be stable.  We assessed her mechanically and radiographically.  We then irrigated the soft tissue with normal saline solution.  We closed the joint capsule with interrupted #1 Ethibond suture followed by #1 Vicryl to close the tensor  ?fascia.  0 Vicryl was used to close deep tissue and 2-0 Vicryl was used to close the subcutaneous tissue.  The skin was closed with staples.  An Aquacel dressing was applied.  She was taken off the Hana table and taken to recovery room in stable  ?condition with all final counts being correct.  No complications noted.  Of note, Erskine Emery, PA-C, assisted during the entire case from beginning to end.  His assistance was crucial for soft tissue management and retracting of soft tissues as well as ? helping guide implant placement.  He performed a layered closure of wounds.  His assistance was medically necessary. ? ? ?PUS ?D: 02/17/2022 11:23:46 am T: 02/17/2022 2:43:00 pm  ?JOB: [id number]  ?

## 2022-02-18 DIAGNOSIS — M87052 Idiopathic aseptic necrosis of left femur: Secondary | ICD-10-CM | POA: Diagnosis not present

## 2022-02-18 LAB — CBC
HCT: 33.5 % — ABNORMAL LOW (ref 36.0–46.0)
Hemoglobin: 11 g/dL — ABNORMAL LOW (ref 12.0–15.0)
MCH: 34.2 pg — ABNORMAL HIGH (ref 26.0–34.0)
MCHC: 32.8 g/dL (ref 30.0–36.0)
MCV: 104 fL — ABNORMAL HIGH (ref 80.0–100.0)
Platelets: 360 10*3/uL (ref 150–400)
RBC: 3.22 MIL/uL — ABNORMAL LOW (ref 3.87–5.11)
RDW: 11.9 % (ref 11.5–15.5)
WBC: 13 10*3/uL — ABNORMAL HIGH (ref 4.0–10.5)
nRBC: 0 % (ref 0.0–0.2)

## 2022-02-18 LAB — BASIC METABOLIC PANEL
Anion gap: 4 — ABNORMAL LOW (ref 5–15)
BUN: 13 mg/dL (ref 6–20)
CO2: 27 mmol/L (ref 22–32)
Calcium: 8.3 mg/dL — ABNORMAL LOW (ref 8.9–10.3)
Chloride: 100 mmol/L (ref 98–111)
Creatinine, Ser: 0.48 mg/dL (ref 0.44–1.00)
GFR, Estimated: >60 mL/min (ref >60)
Glucose, Bld: 109 mg/dL — ABNORMAL HIGH (ref 70–99)
Potassium: 3.9 mmol/L (ref 3.5–5.1)
Sodium: 131 mmol/L — ABNORMAL LOW (ref 135–145)

## 2022-02-18 MED ORDER — CYCLOBENZAPRINE HCL 10 MG PO TABS: 10.0000 mg | ORAL_TABLET | Freq: Three times a day (TID) | ORAL | 2 refills | Status: DC | PRN

## 2022-02-18 MED ORDER — OXYCODONE HCL 5 MG PO TABS: 5.0000 mg | ORAL_TABLET | ORAL | 0 refills | Status: DC | PRN

## 2022-02-18 MED ORDER — ASPIRIN 81 MG PO CHEW: 81.0000 mg | CHEWABLE_TABLET | Freq: Two times a day (BID) | ORAL | 0 refills | Status: DC

## 2022-02-18 NOTE — Plan of Care (Signed)
  Problem: Education: Goal: Knowledge of General Education information will improve Description Including pain rating scale, medication(s)/side effects and non-pharmacologic comfort measures Outcome: Progressing   

## 2022-02-18 NOTE — Progress Notes (Signed)
Physical Therapy Treatment ?Patient Details ?Name: Kelsey [name] ?MRN: [id number] ?DOB: 1981-04-07 ?Today's Date: 02/18/2022 ? ? ?History of Present Illness Pt is a 41yo female presenting s/p L THA anterior approach. PMH: anxiety/depression, GERD, HTN, PVC, seizures, tachycardia. ? ?  ?PT Comments  ? ? Progressing with mobility. Pain rated 9/10 during session. Will plan to have a 2nd session to practice stair negotiation later today prior to d/c home.    ?Recommendations for follow up therapy are one component of a multi-disciplinary discharge planning process, led by the attending physician.  Recommendations may be updated based on patient status, additional functional criteria and insurance authorization. ? ?Follow Up Recommendations ? Follow physician's recommendations for discharge plan and follow up therapies ?  ?  ?Assistance Recommended at Discharge Intermittent Supervision/Assistance  ?Patient can return home with the following A little help with walking and/or transfers;A little help with bathing/dressing/bathroom;Assistance with cooking/housework;Assist for transportation;Help with stairs or ramp for entrance ?  ?Equipment Recommendations ? Rolling walker (2 wheels)  ?  ?Recommendations for Other Services   ? ? ?  ?Precautions / Restrictions Precautions ?Precautions: Fall ?Restrictions ?Weight Bearing Restrictions: No ?LLE Weight Bearing: Weight bearing as tolerated  ?  ? ?Mobility ? Bed Mobility ?Overal bed mobility: Needs Assistance ?Bed Mobility: Supine to Sit, Sit to Supine ?  ?  ?Supine to sit: HOB elevated, Supervision ?Sit to supine: HOB elevated, Supervision ?  ?General bed mobility comments: Supv for safety. Increased time. ?  ? ?Transfers ?Overall transfer level: Needs assistance ?Equipment used: Rolling walker (2 wheels) ?Transfers: Sit to/from Stand ?Sit to Stand: Min guard ?  ?  ?  ?  ?  ?General transfer comment: Min guard for safety. Cues for safety, technique, hand placement. ?   ? ?Ambulation/Gait ?Ambulation/Gait assistance: Min guard ?Gait Distance (Feet): 75 Feet ?Assistive device: Rolling walker (2 wheels) ?Gait Pattern/deviations: Step-through pattern, Decreased stride length ?  ?  ?  ?General Gait Details: Min guard for safety. Slow but steady gait. Pt denied dizziness. ? ? ?Stairs ?  ?  ?  ?  ?  ? ? ?Wheelchair Mobility ?  ? ?Modified Rankin (Stroke Patients Only) ?  ? ? ?  ?Balance Overall balance assessment: Needs assistance ?  ?  ?  ?  ?Standing balance support: Reliant on assistive device for balance, During functional activity, Bilateral upper extremity supported ?Standing balance-Leahy Scale: Fair ?  ?  ?  ?  ?  ?  ?  ?  ?  ?  ?  ?  ?  ? ?  ?Cognition Arousal/Alertness: Awake/alert ?Behavior During Therapy: [facility] for tasks assessed/performed ?Overall Cognitive Status: Within Functional Limits for tasks assessed ?  ?  ?  ?  ?  ?  ?  ?  ?  ?  ?  ?  ?  ?  ?  ?  ?  ?  ?  ? ?  ?Exercises Total Joint Exercises ?Ankle Circles/Pumps: AROM, Both, 10 reps ?Quad Sets: AROM, 10 reps, Both ?Heel Slides: AAROM, Left, 10 reps ?Hip ABduction/ADduction: AAROM, Left, 10 reps ? ?  ?General Comments   ?  ?  ? ?Pertinent Vitals/Pain Pain Assessment ?Pain Assessment: 0-10 ?Pain Score: 9  ?Pain Location: L thigh ?Pain Descriptors / Indicators: Discomfort, Sore, Burning ?Pain Intervention(s): Limited activity within patient's tolerance, Monitored during session, Repositioned, Ice applied  ? ? ?Home Living   ?  ?  ?  ?  ?  ?  ?  ?  ?  ?   ?  ?  Prior Function    ?  ?  ?   ? ?PT Goals (current goals can now be found in the care plan section) Progress towards PT goals: Progressing toward goals ? ?  ?Frequency ? ? ? 7X/week ? ? ? ?  ?PT Plan Current plan remains appropriate  ? ? ?Co-evaluation   ?  ?  ?  ?  ? ?  ?AM-PAC PT "6 Clicks" Mobility   ?Outcome Measure ? Help needed turning from your back to your side while in a flat bed without using bedrails?: A Little ?Help needed moving from lying on your back  to sitting on the side of a flat bed without using bedrails?: A Little ?Help needed moving to and from a bed to a chair (including a wheelchair)?: A Little ?Help needed standing up from a chair using your arms (e.g., wheelchair or bedside chair)?: A Little ?Help needed to walk in hospital room?: A Little ?Help needed climbing 3-5 steps with a railing? : A Little ?6 Click Score: 18 ? ?  ?End of Session Equipment Utilized During Treatment: Gait belt ?Activity Tolerance: Patient limited by pain ?Patient left: in bed;with call bell/phone within reach ?Nurse Communication: Patient requests pain meds ?PT Visit Diagnosis: Unsteadiness on feet (R26.81);Difficulty in walking, not elsewhere classified (R26.2) ?  ? ? ?Time: 4854-6270 ?PT Time Calculation (min) (ACUTE ONLY): 20 min ? ?Charges:  $Gait Training: 8-22 mins          ?          ? ? ? ? ? ?Faye Ramsay, PT ?Acute Rehabilitation  ?Office: [phone] ?Pager: [phone] ? ?  ? ?

## 2022-02-18 NOTE — Progress Notes (Addendum)
Physical Therapy Treatment ?Patient Details ?Name: Kelsey [name] ?MRN: [id number] ?DOB: January 29, 1981 ?Today's Date: 02/18/2022 ? ? ?History of Present Illness Pt is a 41yo female presenting s/p L THA anterior approach. PMH: anxiety/depression, GERD, HTN, PVC, seizures, tachycardia. ? ?  ?PT Comments  ? ? 2nd session to continue gait training and stair step training. Pt reports she was already issued a HEP handout. All education completed.    ?Recommendations for follow up therapy are one component of a multi-disciplinary discharge planning process, led by the attending physician.  Recommendations may be updated based on patient status, additional functional criteria and insurance authorization. ? ?Follow Up Recommendations ? Follow physician's recommendations for discharge plan and follow up therapies (possible plan for HHPT vs HEP) ?  ?  ?Assistance Recommended at Discharge Intermittent Supervision/Assistance  ?Patient can return home with the following A little help with walking and/or transfers;A little help with bathing/dressing/bathroom;Assistance with cooking/housework;Assist for transportation;Help with stairs or ramp for entrance ?  ?Equipment Recommendations ? Rolling walker (2 wheels)  ?  ?Recommendations for Other Services   ? ? ?  ?Precautions / Restrictions Precautions ?Precautions: Fall ?Restrictions ?Weight Bearing Restrictions: No ?LLE Weight Bearing: Weight bearing as tolerated  ?  ? ?Mobility ? Bed Mobility ?Overal bed mobility: Needs Assistance ?Bed Mobility: Supine to Sit, Sit to Supine ?  ?  ?Supine to sit: Supervision, HOB elevated ?Sit to supine: Supervision, HOB elevated ?  ?General bed mobility comments: Supv for safety. Increased time. ?  ? ?Transfers ?Overall transfer level: Needs assistance ?Equipment used: Rolling walker (2 wheels) ?Transfers: Sit to/from Stand ?Sit to Stand: Min guard ?  ?  ?  ?  ?  ?General transfer comment: Min guard for safety. Cues for safety, technique, hand  placement. ?  ? ?Ambulation/Gait ?Ambulation/Gait assistance: Min guard ?Gait Distance (Feet): 75 Feet ?Assistive device: Rolling walker (2 wheels) ?Gait Pattern/deviations: Step-through pattern, Decreased stride length, Decreased step length - right, Decreased step length - left ?  ?  ?  ?General Gait Details: Min guard for safety. Slow but steady gait. Pt denied dizziness. HR was elevated once back in room- HR 131 bpm-made RN aware.  ? ? ?Stairs ?Stairs: Yes ?Stairs assistance: Min guard ?Stair Management: Step to pattern, Forwards, With walker ?Number of Stairs: 1 ?General stair comments: Cues for safety, technique, sequence. Min guard for safety. ? ? ?Wheelchair Mobility ?  ? ?Modified Rankin (Stroke Patients Only) ?  ? ? ?  ?Balance Overall balance assessment: Needs assistance ?  ?  ?  ?  ?Standing balance support: Reliant on assistive device for balance, During functional activity, Bilateral upper extremity supported ?Standing balance-Leahy Scale: Fair ?  ?  ?  ?  ?  ?  ?  ?  ?  ?  ?  ?  ?  ? ?  ?Cognition Arousal/Alertness: Awake/alert ?Behavior During Therapy: [facility] for tasks assessed/performed ?Overall Cognitive Status: Within Functional Limits for tasks assessed ?  ?  ?  ?  ?  ?  ?  ?  ?  ?  ?  ?  ?  ?  ?  ?  ?  ?  ?  ? ?  ?Exercises Total Joint Exercises ?Ankle Circles/Pumps: AROM, Both, 10 reps ?Quad Sets: AROM, 10 reps, Both ?Heel Slides: AAROM, Left, 10 reps ?Hip ABduction/ADduction: AAROM, Left, 10 reps ? ?  ?General Comments   ?  ?  ? ?Pertinent Vitals/Pain Pain Assessment ?Pain Assessment: 0-10 ?Pain Score: 9  ?Pain Location: L  thigh ?Pain Descriptors / Indicators: Discomfort, Sore, Burning ?Pain Intervention(s): Limited activity within patient's tolerance, Monitored during session, Repositioned  ? ? ?Home Living   ?  ?  ?  ?  ?  ?  ?  ?  ?  ?   ?  ?Prior Function    ?  ?  ?   ? ?PT Goals (current goals can now be found in the care plan section) Progress towards PT goals: Progressing toward goals ? ?   ?Frequency ? ? ? 7X/week ? ? ? ?  ?PT Plan Current plan remains appropriate  ? ? ?Co-evaluation   ?  ?  ?  ?  ? ?  ?AM-PAC PT "6 Clicks" Mobility   ?Outcome Measure ? Help needed turning from your back to your side while in a flat bed without using bedrails?: A Little ?Help needed moving from lying on your back to sitting on the side of a flat bed without using bedrails?: A Little ?Help needed moving to and from a bed to a chair (including a wheelchair)?: A Little ?Help needed standing up from a chair using your arms (e.g., wheelchair or bedside chair)?: A Little ?Help needed to walk in hospital room?: A Little ?Help needed climbing 3-5 steps with a railing? : A Little ?6 Click Score: 18 ? ?  ?End of Session Equipment Utilized During Treatment: Gait belt ?Activity Tolerance: Patient limited by pain;Patient tolerated treatment well ?Patient left: in bed;with call bell/phone within reach ?  ?PT Visit Diagnosis: Unsteadiness on feet (R26.81);Difficulty in walking, not elsewhere classified (R26.2) ?  ? ? ?Time: 3329-5188 ?PT Time Calculation (min) (ACUTE ONLY): 24 min ? ?Charges:             ?          ? ? ? ? ?Faye Ramsay, PT ?Acute Rehabilitation  ?Office: [phone] ?Pager: [phone] ? ?  ? ?

## 2022-02-18 NOTE — Progress Notes (Addendum)
Attempted IV x 1 with no success. IV team called and attempted with no success. IV team nurse Izora Gala) reported she advised the patient she was going to get [place] machine to assist with finding the veins and the patient refusing to be stuck again. Patient reports she is going home today. MD made aware. ?

## 2022-02-18 NOTE — Discharge Summary (Signed)
?Patient ID: ?Kelsey [name] ?MRN: [id number] ?DOB/AGE: 42-27-1982 42 y.o. ? ?Admit date: 02/17/2022 ?Discharge date: 02/18/2022 ? ?Admission Diagnoses:  ?Principal Problem: ?  Avascular necrosis of bone of left hip (HCC) ?Active Problems: ?  Status post left hip replacement ? ? ?Discharge Diagnoses:  ?Same ? ?Past Medical History:  ?Diagnosis Date  ? Anxiety   ? Asthma   ? hosp. 2018 and 2019 no h/o intubation   ? Depression   ? GERD (gastroesophageal reflux disease)   ? Hypertension   ? Palpitations   ? Perianal abscess 11/22/2016  ? 12/07/2016.  [facility], [place].  Ovidio Kin, MD.  Perianal abscess is much better but still needs local wound care.  Continue sitz baths 2-3 times perday until all drainage and pain are gone.  Follow up as needed.  ? PVC (premature ventricular contraction)   ? Seasonal allergies   ? Seizures (HCC)   ? 01/15/22 was last seizure  ? Tachycardia   ? ? ?Surgeries: Procedure(s): ?Left TOTAL HIP ARTHROPLASTY ANTERIOR APPROACH on 02/17/2022 ?  ?Consultants:  ? ?Discharged Condition: Improved ? ?Hospital Course: Kelsey [name] is an 41 y.o. female who was admitted 02/17/2022 for operative treatment ofAvascular necrosis of bone of left hip (HCC). Patient has severe unremitting pain that affects sleep, daily activities, and work/hobbies. After pre-op clearance the patient was taken to the operating room on 02/17/2022 and underwent  Procedure(s): ?Left TOTAL HIP ARTHROPLASTY ANTERIOR APPROACH.   ? ?Patient was given perioperative antibiotics:  ?Anti-infectives (From admission, onward)  ? ? Start     Dose/Rate Route Frequency Ordered Stop  ? 02/17/22 1630  ceFAZolin (ANCEF) IVPB 1 g/50 mL premix       ? 1 g ?100 mL/hr over 30 Minutes Intravenous Every 6 hours 02/17/22 1318 02/17/22 2318  ? 02/17/22 0807  ceFAZolin (ANCEF) 2-4 GM/100ML-% IVPB       ?Note to Pharmacy: Viviano Simas E: cabinet override  ?    02/17/22 0807 02/17/22 2014  ? 02/17/22 0800  clindamycin (CLEOCIN) IVPB 900 mg   Status:  Discontinued       ? 900 mg ?100 mL/hr over 30 Minutes Intravenous On call to O.R. 02/17/22 0746 02/17/22 1214  ? ?  ?  ? ?Patient was given sequential compression devices, early ambulation, and chemoprophylaxis to prevent DVT. ? ?Patient benefited maximally from hospital stay and there were no complications.   ? ?Recent vital signs: Patient Vitals for the past 24 hrs: ? BP Temp Temp src Pulse Resp SpO2 Height Weight  ?02/18/22 0857 (!) 153/104 98.2 ?F (36.8 ?C) Oral (!) 104 -- 94 % -- --  ?02/18/22 0521 (!) 149/110 98.6 ?F (37 ?C) Oral (!) 102 18 96 % -- --  ?02/18/22 0116 (!) 142/97 97.8 ?F (36.6 ?C) Oral 94 18 96 % -- --  ?02/17/22 2142 117/80 98.3 ?F (36.8 ?C) Oral (!) 101 18 95 % -- --  ?02/17/22 1539 113/75 97.7 ?F (36.5 ?C) Oral (!) 102 18 95 % -- --  ?02/17/22 1327 109/87 98 ?F (36.7 ?C) Oral 81 16 94 % 5' 4.02" (1.626 m) 65 kg  ?02/17/22 1300 96/64 -- -- 74 19 92 % -- --  ?02/17/22 1245 105/71 97.6 ?F (36.4 ?C) -- 81 15 93 % -- --  ?02/17/22 1230 102/65 -- -- 89 20 93 % -- --  ?02/17/22 1215 113/80 -- -- 85 20 93 % -- --  ?02/17/22 1200 101/69 -- -- 84 16 97 % -- --  ?  02/17/22 1145 107/74 (!) 97.3 ?F (36.3 ?C) -- 83 19 100 % -- --  ?  ? ?Recent laboratory studies:  ?Recent Labs  ?  02/18/22 ?0328  ?WBC 13.0*  ?HGB 11.0*  ?HCT 33.5*  ?PLT 360  ?NA 131*  ?K 3.9  ?CL 100  ?CO2 27  ?BUN 13  ?CREATININE 0.48  ?GLUCOSE 109*  ?CALCIUM 8.3*  ? ? ? ?Discharge Medications:   ?Allergies as of 02/18/2022   ? ?   Reactions  ? Penicillins Rash  ? Has patient had a PCN reaction causing immediate rash, facial/tongue/throat swelling, SOB or lightheadedness with hypotension: No ?Has patient had a PCN reaction causing severe rash involving mucus membranes or skin necrosis: No ?Has patient had a PCN reaction that required hospitalization No ?Has patient had a PCN reaction occurring within the last 10 years: No ?If all of the above answers are "NO", then may proceed with Cephalosporin use.  ? ?  ? ?  ?Medication List   ?  ? ?STOP taking these medications   ? ?ibuprofen 200 MG tablet ?Commonly known as: ADVIL ?  ?oxyCODONE-acetaminophen 5-325 MG tablet ?Commonly known as: PERCOCET/ROXICET ?  ? ?  ? ?TAKE these medications   ? ?albuterol 108 (90 Base) MCG/ACT inhaler ?Commonly known as: VENTOLIN HFA ?Inhale 1-2 puffs into the lungs every 6 (six) hours as needed for wheezing or shortness of breath. ?  ?ALPRAZolam 1 MG tablet ?Commonly known as: Prudy FeelerXANAX ?Take 1 tablet (1 mg total) by mouth at bedtime as needed for anxiety. ?What changed: when to take this ?  ?aspirin 81 MG chewable tablet ?Chew 1 tablet (81 mg total) by mouth 2 (two) times daily. ?  ?cyclobenzaprine 10 MG tablet ?Commonly known as: FLEXERIL ?Take 1 tablet (10 mg total) by mouth 3 (three) times daily as needed for muscle spasms. ?What changed: when to take this ?  ?diphenhydrAMINE 25 MG tablet ?Commonly known as: BENADRYL ?Take 25 mg by mouth every 6 (six) hours as needed for allergies. ?  ?FLUoxetine 40 MG capsule ?Commonly known as: PROZAC ?Take 80 mg by mouth daily. ?  ?gabapentin 300 MG capsule ?Commonly known as: NEURONTIN ?TAKE 2 CAPSULES (600 MG TOTAL) BY MOUTH 3 (THREE) TIMES DAILY. ?What changed:  ?how much to take ?when to take this ?  ?hydrALAZINE 25 MG tablet ?Commonly known as: APRESOLINE ?Take 1 tablet (25 mg total) by mouth 3 (three) times daily. ?  ?levETIRAcetam 500 MG tablet ?Commonly known as: Keppra ?Take 1 tablet (500 mg total) by mouth 2 (two) times daily. ?  ?levonorgestrel 20 MCG/24HR IUD ?Commonly known as: MIRENA ?1 Intra Uterine Device (1 each total) by Intrauterine route once. ?  ?naproxen 500 MG tablet ?Commonly known as: NAPROSYN ?TAKE 1 TABLET BY MOUTH 2 TIMES DAILY WITH A MEAL. ?What changed:  ?when to take this ?reasons to take this ?  ?oxyCODONE 5 MG immediate release tablet ?Commonly known as: Oxy IR/ROXICODONE ?Take 1-2 tablets (5-10 mg total) by mouth every 4 (four) hours as needed for moderate pain (pain score 4-6). ?   ?telmisartan-hydrochlorothiazide 40-12.5 MG tablet ?Commonly known as: MICARDIS HCT ?TAKE 1 TABLET BY MOUTH EVERY DAY ?  ? ?  ? ?  ?  ? ? ?  ?Durable Medical Equipment  ?(From admission, onward)  ?  ? ? ?  ? ?  Start     Ordered  ? 02/17/22 1319  DME 3 n 1  Once       ? 02/17/22  1318  ? 02/17/22 1319  DME Walker rolling  Once       ?Question Answer Comment  ?Walker: With 5 Inch Wheels   ?Patient needs a walker to treat with the following condition Status post left hip replacement   ?  ? 02/17/22 1318  ? ?  ?  ? ?  ? ? ?Diagnostic Studies: DG Pelvis Portable ? ?Result Date: 02/17/2022 ?CLINICAL DATA:  Status post left hip arthroplasty EXAM: PORTABLE PELVIS 1-2 VIEWS COMPARISON:  06/30/2021 FINDINGS: There is recent left hip arthroplasty. IUD is seen in the pelvis. Sclerotic changes are noted in the right femoral head suggesting possible avascular necrosis. IMPRESSION: Status post left hip arthroplasty. Possible avascular necrosis in the head of the right femur. Electronically Signed   By: Ernie Avena M.D.   On: 02/17/2022 12:09  ? ?DG C-Arm 1-60 Min-No Report ? ?Result Date: 02/17/2022 ?Fluoroscopy was utilized by the requesting physician.  No radiographic interpretation.  ? ?DG HIP UNILAT WITH PELVIS 1V LEFT ? ?Result Date: 02/17/2022 ?CLINICAL DATA:  Fluoroscopic assistance for left hip arthroplasty EXAM: DG HIP (WITH OR WITHOUT PELVIS) 1V*L* COMPARISON:  06/30/2021 FINDINGS: Fluoroscopic images show left hip arthroplasty. No displaced fracture is seen. IUD is seen in the pelvis. Fluoroscopic time was 10 seconds. Radiation dose 0.9 mGy. IMPRESSION: Fluoroscopic assistance was provided for left hip arthroplasty. Electronically Signed   By: Ernie Avena M.D.   On: 02/17/2022 12:07   ? ?Disposition: Discharge disposition: 01-Home or Self Care ? ? ? ? ? ? ?Discharge Instructions   ? ? Discharge patient   Complete by: As directed ?  ? Can discharge to home this afternoon.  ? Discharge disposition: 01-Home  or Self Care  ? Discharge patient date: 02/18/2022  ? ?  ? ? ? Follow-up Information   ? ? Kathryne Hitch, MD Follow up in 2 week(s).   ?Specialty: Orthopedic Surgery ?Contact information: ?[address]

## 2022-02-18 NOTE — Progress Notes (Signed)
The patient is alert and oriented and has been seen by her physician. The orders for discharge were written. IV has been removed. Went over discharge instructions with patient. She is being discharged via wheelchair with all of her belongings.   

## 2022-02-18 NOTE — Progress Notes (Signed)
Subjective: ?1 Day Post-Op Procedure(s) (LRB): ?Left TOTAL HIP ARTHROPLASTY ANTERIOR APPROACH (Left) ?Patient reports pain as moderate.  She is hypertensive, but has recently been working with her PCP on her BP control. ? ?Objective: ?Vital signs in last 24 hours: ?Temp:  [97.3 ?F (36.3 ?C)-98.6 ?F (37 ?C)] 98.2 ?F (36.8 ?C) (03/25 0857) ?Pulse Rate:  [74-104] 104 (03/25 0857) ?Resp:  [15-20] 18 (03/25 0521) ?BP: (96-153)/(64-110) 153/104 (03/25 0857) ?SpO2:  [92 %-100 %] 94 % (03/25 0857) ?Weight:  [65 kg] 65 kg (03/24 1327) ? ?Intake/Output from previous day: ?03/24 0701 - 03/25 0700 ?In: 3279.1 [P.O.:1180; I.V.:2002.3; IV Piggyback:96.8] ?Out: 1150 [Urine:950; Blood:200] ?Intake/Output this shift: ?No intake/output data recorded. ? ?Recent Labs  ?  02/18/22 ?0328  ?HGB 11.0*  ? ?Recent Labs  ?  02/18/22 ?0328  ?WBC 13.0*  ?RBC 3.22*  ?HCT 33.5*  ?PLT 360  ? ?Recent Labs  ?  02/18/22 ?0328  ?NA 131*  ?K 3.9  ?CL 100  ?CO2 27  ?BUN 13  ?CREATININE 0.48  ?GLUCOSE 109*  ?CALCIUM 8.3*  ? ?No results for input(s): LABPT, INR in the last 72 hours. ? ?Sensation intact distally ?Intact pulses distally ?Dorsiflexion/Plantar flexion intact ?Incision: dressing C/D/I ? ? ?Assessment/Plan: ?1 Day Post-Op Procedure(s) (LRB): ?Left TOTAL HIP ARTHROPLASTY ANTERIOR APPROACH (Left) ?Up with therapy ?Discharge home with home health ? ? ? ? ? ?Mcarthur Rossetti ?02/18/2022, 9:07 AM ? ?

## 2022-02-18 NOTE — Progress Notes (Signed)
Attempted IV start  x1 but unsuccessful, refused  IV start now, RN made aware. ?

## 2022-02-18 NOTE — TOC Transition Note (Signed)
Transition of Care (TOC) - CM/SW Discharge Note ? ? ?Patient Details  ?Name: Kelsey [name] ?MRN: [id number] ?Date of Birth: [birth year] ? ?Transition of Care (TOC) CM/SW Contact:  ?Darleene Cleaver, LCSW ?Phone Number: ?02/18/2022, 3:05 PM ? ? ?Clinical Narrative:    ? ?Patient will be discharging back home with home exercises.  CSW was informed that she needs a rolling walker and 3 in 1 commode.  CSW contacted [employer] and they were able to deliver to patient before she discharges. ? ?Final next level of care: Home/Self Care ?Barriers to Discharge: Barriers Resolved ?l ? ?Patient Goals and CMS Choice ?Patient states their goals for this hospitalization and ongoing recovery are:: To return back home with home exercises ?  ?  ? ?Discharge Placement ?  ?           ?  ?  ?  ?  ? ?Discharge Plan and Services ?  ?  ?           ?DME Arranged: 3-N-1, Walker rolling ?DME Agency: [employer] ?Date DME Agency Contacted: 02/18/22 ?Time DME Agency Contacted: 1115 ?Representative spoke with at DME Agency: Leavy Cella ?  ?  ?  ?  ?  ? ?Social Determinants of Health (SDOH) Interventions ?  ? ? ?Readmission Risk Interventions ?   ? View : No data to display.  ?  ?  ?  ? ? ? ? ? ?

## 2022-02-18 NOTE — Discharge Instructions (Signed)

## 2022-02-20 ENCOUNTER — Telehealth: Payer: Self-pay

## 2022-02-20 NOTE — Telephone Encounter (Signed)
Transition Care Management Follow-up Telephone Call ?Date of discharge and from where: 02/18/22 [name]  ?How have you been since you were released from the hospital? Ok little pain ?Any questions or concerns? No ? ?Items Reviewed: ?Did the pt receive and understand the discharge instructions provided? Yes  ?Medications obtained and verified? Yes  ?Other?    ?Any new allergies since your discharge? No  ?Dietary orders reviewed? Yes ?Do you have support at home? Yes  ? ?Home Care and Equipment/Supplies: ?Were home health services ordered? no ?If so, what is the name of the agency?   ?Has the agency set up a time to come to the patient's home? not applicable ?Were any new equipment or medical supplies ordered?  Yes: beside commode and rolling walker  ?What is the name of the medical supply agency? Hospital provided ?Were you able to get the supplies/equipment? yes ?Do you have any questions related to the use of the equipment or supplies? No ? ?Functional Questionnaire: (I = Independent and D = Dependent) ?ADLs: I ? ?Bathing/Dressing- I ? ?Meal Prep- I ? ?Eating- I ? ?Maintaining continence- I ? ?Transferring/Ambulation- I ? ?Managing Meds- I ? ?Follow up appointments reviewed: ? ?PCP Hospital f/u appt confirmed? Yes  Front desk to schedule  ?Specialist Hospital f/u appt confirmed? Yes  Scheduled to see Dr.Blackmon on 03/02/22 @ 2:15. ?Are transportation arrangements needed? No  ?If their condition worsens, is the pt aware to call PCP or go to the Emergency Dept.? Yes ?Was the patient provided with contact information for the PCP's office or ED? Yes ?Was to pt encouraged to call back with questions or concerns? Yes  ?

## 2022-02-21 ENCOUNTER — Encounter: Payer: Self-pay | Admitting: Orthopaedic Surgery

## 2022-02-21 ENCOUNTER — Encounter (HOSPITAL_COMMUNITY): Payer: Self-pay | Admitting: Orthopaedic Surgery

## 2022-02-21 ENCOUNTER — Other Ambulatory Visit: Payer: Self-pay | Admitting: Orthopaedic Surgery

## 2022-02-21 MED ORDER — OXYCODONE HCL 5 MG PO TABS: 5.0000 mg | ORAL_TABLET | Freq: Four times a day (QID) | ORAL | 0 refills | Status: DC | PRN

## 2022-02-23 ENCOUNTER — Telehealth (INDEPENDENT_AMBULATORY_CARE_PROVIDER_SITE_OTHER): Payer: Commercial Managed Care - PPO | Admitting: Internal Medicine

## 2022-02-23 ENCOUNTER — Encounter: Payer: Self-pay | Admitting: Internal Medicine

## 2022-02-23 DIAGNOSIS — Z09 Encounter for follow-up examination after completed treatment for conditions other than malignant neoplasm: Secondary | ICD-10-CM

## 2022-02-23 DIAGNOSIS — Z96642 Presence of left artificial hip joint: Secondary | ICD-10-CM | POA: Diagnosis not present

## 2022-02-23 DIAGNOSIS — I10 Essential (primary) hypertension: Secondary | ICD-10-CM | POA: Diagnosis not present

## 2022-02-23 NOTE — Assessment & Plan Note (Signed)
For avascular necrosis of left hip ?Pain currently controlled with Norco as needed ?Follow-up with orthopedic surgeon as scheduled ?Continue aspirin for DVT PPx ?

## 2022-02-23 NOTE — Progress Notes (Signed)
?  ? ?Virtual Visit via Video Note  ? ?This visit type was conducted due to national recommendations for restrictions regarding the COVID-19 Pandemic (e.g. social distancing) in an effort to limit this patient's exposure and mitigate transmission in our community.  Due to her co-morbid illnesses, this patient is at least at moderate risk for complications without adequate follow up.  This format is felt to be most appropriate for this patient at this time.  All issues noted in this document were discussed and addressed.  A limited physical exam was performed with this format. ?   ? ?Evaluation Performed:  Follow-up visit ? ?Date:  02/23/2022  ? ?ID:  Kelsey [name], DOB 1981/06/13, MRN [id number] ? ?Patient Location: Home ?Provider Location: Office/Clinic ? ?Participants: Patient ?Location of Patient: Home ?Location of Provider: Telehealth ?Consent was obtain for visit to be over via telehealth. ?I verified that I am speaking with the correct person using two identifiers. ? ?PCP:  Lindell Spar, MD  ? ?Chief Complaint: Hospital discharge follow-up ? ?History of Present Illness:   ? ?Kelsey [name] is a 41 y.o. female with past medical history of HTN, asthma, GERD, seizure disorder, depression with anxiety, chronic back and hip pain and tobacco abuse who presents for f/u after her recent hospitalization for left THA. ? ?She has had left hip replacement for history of avascular necrosis of the left hip.  She has been feeling better since the surgery on 03/24.  She is currently taking Norco as needed for severe pain.  She has started ambulating and simple exercises as advised by her orthopedic surgeon.  She also currently takes aspirin 2 tablets of 81 mg.  Denies any leg swelling or dyspnea currently. ? ?The patient does not have symptoms concerning for COVID-19 infection (fever, chills, cough, or new shortness of breath).  ? ?Past Medical, Surgical, Social History, Allergies, and Medications have been  Reviewed. ? ?Past Medical History:  ?Diagnosis Date  ? Anxiety   ? Asthma   ? hosp. 2018 and 2019 no h/o intubation   ? Depression   ? GERD (gastroesophageal reflux disease)   ? Hypertension   ? Palpitations   ? Perianal abscess 11/22/2016  ? 12/07/2016.  [facility], [place].  [name], MD.  Perianal abscess is much better but still needs local wound care.  Continue sitz baths 2-3 times perday until all drainage and pain are gone.  Follow up as needed.  ? Preop general physical exam 01/26/2022  ? PVC (premature ventricular contraction)   ? Seasonal allergies   ? Seizures ([place])   ? 01/15/22 was last seizure  ? Tachycardia   ? ?Past Surgical History:  ?Procedure Laterality Date  ? CESAREAN SECTION  10/15/2009, 02/08/2006  ? INCISION AND DRAINAGE PERIRECTAL ABSCESS Bilateral 11/22/2016  ? Procedure: IRRIGATION AND DEBRIDEMENT PERIRECTAL ABSCESS;  Surgeon: [name], MD;  Location: [facility];  Service: General;  Laterality: Bilateral;  ? INTRAUTERINE DEVICE INSERTION    ? Mirena 12/24/19 exp 12/28/21 Dr Casimer Bilis [name] ob/gyn  ? TOTAL HIP ARTHROPLASTY Left 02/17/2022  ? Procedure: Left TOTAL HIP ARTHROPLASTY ANTERIOR APPROACH;  Surgeon: Mcarthur Rossetti, MD;  Location: [facility];  Service: Orthopedics;  Laterality: Left;  ?  ? ?Current Meds  ?Medication Sig  ? albuterol (VENTOLIN HFA) 108 (90 Base) MCG/ACT inhaler Inhale 1-2 puffs into the lungs every 6 (six) hours as needed for wheezing or shortness of breath.  ? ALPRAZolam (XANAX) 1 MG tablet Take 1  tablet (1 mg total) by mouth at bedtime as needed for anxiety. (Patient taking differently: Take 1 mg by mouth 3 (three) times daily.)  ? aspirin 81 MG chewable tablet Chew 1 tablet (81 mg total) by mouth 2 (two) times daily.  ? cyclobenzaprine (FLEXERIL) 10 MG tablet Take 1 tablet (10 mg total) by mouth 3 (three) times daily as needed for muscle spasms.  ? diphenhydrAMINE (BENADRYL) 25 MG tablet Take 25 mg by mouth every 6 (six) hours as needed for  allergies.  ? FLUoxetine (PROZAC) 40 MG capsule Take 80 mg by mouth daily.  ? gabapentin (NEURONTIN) 300 MG capsule TAKE 2 CAPSULES (600 MG TOTAL) BY MOUTH 3 (THREE) TIMES DAILY. (Patient taking differently: Take 1,200 mg by mouth 4 (four) times daily.)  ? hydrALAZINE (APRESOLINE) 25 MG tablet Take 1 tablet (25 mg total) by mouth 3 (three) times daily.  ? levETIRAcetam (KEPPRA) 500 MG tablet Take 1 tablet (500 mg total) by mouth 2 (two) times daily.  ? levonorgestrel (MIRENA) 20 MCG/24HR IUD 1 Intra Uterine Device (1 each total) by Intrauterine route once.  ? naproxen (NAPROSYN) 500 MG tablet TAKE 1 TABLET BY MOUTH 2 TIMES DAILY WITH A MEAL. (Patient taking differently: Take 500 mg by mouth 2 (two) times daily as needed for moderate pain.)  ? oxyCODONE (OXY IR/ROXICODONE) 5 MG immediate release tablet Take 1-2 tablets (5-10 mg total) by mouth every 6 (six) hours as needed for moderate pain (pain score 4-6).  ? telmisartan-hydrochlorothiazide (MICARDIS HCT) 40-12.5 MG tablet TAKE 1 TABLET BY MOUTH EVERY DAY  ?  ? ?Allergies:   Penicillins  ? ?ROS:   ?Please see the history of present illness.    ? ?All other systems reviewed and are negative. ? ? ?Labs/Other Tests and Data Reviewed:   ? ?Recent Labs: ?01/19/2022: TSH 1.597 ?01/20/2022: Magnesium 1.7 ?01/26/2022: ALT 67 ?02/18/2022: BUN 13; Creatinine, Ser 0.48; Hemoglobin 11.0; Platelets 360; Potassium 3.9; Sodium 131  ? ?Recent Lipid Panel ?Lab Results  ?Component Value Date/Time  ? CHOL 231 (H) 07/28/2021 03:10 PM  ? TRIG 595 (HH) 07/28/2021 03:10 PM  ? HDL 50 07/28/2021 03:10 PM  ? CHOLHDL 4.6 (H) 07/28/2021 03:10 PM  ? CHOLHDL 3 01/30/2020 10:51 AM  ? LDLCALC 85 07/28/2021 03:10 PM  ? LDLCALC 66 04/15/2018 01:00 PM  ? ? ?Wt Readings from Last 3 Encounters:  ?02/17/22 143 lb 4.8 oz (65 kg)  ?02/16/22 145 lb (65.8 kg)  ?01/26/22 155 lb (70.3 kg)  ?  ? ?Objective:   ? ?Vital Signs:  There were no vitals taken for this visit.  ? ?VITAL SIGNS:  reviewed ?GEN:  no acute  distress ?EYES:  sclerae anicteric, EOMI - Extraocular Movements Intact ?NEURO:  alert and oriented x 3, no obvious focal deficit ? ?ASSESSMENT & PLAN:   ? ?Hospital discharge follow-up ?Hospital chart reviewed ?Medications reconciled and reviewed with the patient ? ?Status post left hip replacement ?For avascular necrosis of left hip ?Pain currently controlled with Norco as needed ?Follow-up with orthopedic surgeon as scheduled ?Continue aspirin for DVT PPx ? ?Essential hypertension ?BP Readings from Last 1 Encounters:  ?02/18/22 (!) 132/96  ? ?Uncontrolled, could be due to pain from recent surgery ?Continue Telmisartan-HCTZ 40-12.5 mg QD and Hydralazine 25 mg TID for now ?Counseled for compliance with the medications ?Advised DASH diet and moderate exercise/walking, at least 150 mins/week ? ? ? ?Time:   ?Today, I have spent 18 minutes reviewing the chart, including problem list, medications, and with  the patient with telehealth technology discussing the above problems. ? ? ?Medication Adjustments/Labs and Tests Ordered: ?Current medicines are reviewed at length with the patient today.  Concerns regarding medicines are outlined above.  ? ?Tests Ordered: ?No orders of the defined types were placed in this encounter. ? ? ?Medication Changes: ?No orders of the defined types were placed in this encounter. ? ? ? ?Note: This dictation was prepared with Dragon dictation along with smaller phrase technology. Similar sounding words can be transcribed inadequately or may not be corrected upon review. Any transcriptional errors that result from this process are unintentional.  ?  ? ? ?Disposition:  Follow up  ?Signed, ?Lindell Spar, MD  ?02/23/2022 12:06 PM    ? ?[facility] ?[facility] ?

## 2022-02-23 NOTE — Assessment & Plan Note (Signed)
Hospital chart reviewed ?Medications reconciled and reviewed with the patient ?

## 2022-02-23 NOTE — Assessment & Plan Note (Addendum)
BP Readings from Last 1 Encounters:  ?02/18/22 (!) 132/96  ? ?Uncontrolled, could be due to pain from recent surgery ?Continue Telmisartan-HCTZ 40-12.5 mg QD and Hydralazine 25 mg TID for now ?Counseled for compliance with the medications ?Advised DASH diet and moderate exercise/walking, at least 150 mins/week ? ?

## 2022-02-23 NOTE — Patient Instructions (Signed)
Please continue taking medications as prescribed. ? ?Please continue to perform exercises at home as advised by Orthopedic surgeon. ?

## 2022-03-02 ENCOUNTER — Ambulatory Visit (INDEPENDENT_AMBULATORY_CARE_PROVIDER_SITE_OTHER): Payer: Commercial Managed Care - PPO | Admitting: Orthopaedic Surgery

## 2022-03-02 ENCOUNTER — Encounter: Payer: Self-pay | Admitting: Orthopaedic Surgery

## 2022-03-02 DIAGNOSIS — M87051 Idiopathic aseptic necrosis of right femur: Secondary | ICD-10-CM

## 2022-03-02 DIAGNOSIS — Z96642 Presence of left artificial hip joint: Secondary | ICD-10-CM

## 2022-03-02 NOTE — Progress Notes (Signed)
The patient is here for first postoperative visit status post a left total hip arthroplasty.  She is only 41 years old and has advanced avascular necrosis in both her hips.  She is doing so well with her left hip replacement that she came in today with walking without an assistive device and not take any pain medicine.  She has significant right hip pain and is already ready for [place] to schedule her for a right total hip arthroplasty to treat her right hip avascular necrosis.  I agree with this as well. ? ?Her left hip incision looks good.  The staples are removed and Steri-Strips applied.  There is a mild to moderate seroma but she does not want it aspirated and I agree.  There is no evidence of infection.  Her right hip has significant pain on rotation.  Again the right hip has well-documented severe avascular necrosis with impending femoral head collapse. ? ?At this point I agree with getting her set up for a right hip replacement.  We will work on getting her on the schedule and be in touch with the schedule for her.  She will be out of work in the interim.  She will continue increase her activities as comfort allows as it relates to her left hip.  She understands our surgery schedule be in touch. ?

## 2022-03-11 ENCOUNTER — Encounter: Payer: Self-pay | Admitting: Orthopaedic Surgery

## 2022-03-12 ENCOUNTER — Other Ambulatory Visit: Payer: Self-pay | Admitting: Orthopaedic Surgery

## 2022-03-13 NOTE — Telephone Encounter (Signed)
Ok, I should be calling you this week to schedule. ?

## 2022-03-14 ENCOUNTER — Ambulatory Visit: Payer: Commercial Managed Care - PPO | Admitting: Internal Medicine

## 2022-03-17 ENCOUNTER — Encounter: Payer: Self-pay | Admitting: Orthopaedic Surgery

## 2022-03-17 ENCOUNTER — Other Ambulatory Visit: Payer: Self-pay | Admitting: Orthopaedic Surgery

## 2022-03-17 MED ORDER — HYDROCODONE-ACETAMINOPHEN 5-325 MG PO TABS: 1.0000 | ORAL_TABLET | Freq: Four times a day (QID) | ORAL | 0 refills | Status: DC | PRN

## 2022-03-18 ENCOUNTER — Encounter: Payer: Self-pay | Admitting: Orthopaedic Surgery

## 2022-03-24 NOTE — Telephone Encounter (Signed)
See message f rom patient

## 2022-03-27 ENCOUNTER — Other Ambulatory Visit: Payer: Self-pay | Admitting: Orthopaedic Surgery

## 2022-03-27 ENCOUNTER — Encounter: Payer: Self-pay | Admitting: Orthopaedic Surgery

## 2022-03-27 MED ORDER — HYDROCODONE-ACETAMINOPHEN 5-325 MG PO TABS: 1.0000 | ORAL_TABLET | Freq: Four times a day (QID) | ORAL | 0 refills | Status: DC | PRN

## 2022-03-29 ENCOUNTER — Encounter: Payer: Self-pay | Admitting: Orthopaedic Surgery

## 2022-03-29 ENCOUNTER — Other Ambulatory Visit: Payer: Self-pay

## 2022-04-22 ENCOUNTER — Other Ambulatory Visit: Payer: Self-pay | Admitting: Internal Medicine

## 2022-04-22 DIAGNOSIS — I10 Essential (primary) hypertension: Secondary | ICD-10-CM

## 2022-04-25 NOTE — Progress Notes (Signed)
Pt. Needs orders for upcoming surgery. ?

## 2022-04-25 NOTE — Patient Instructions (Signed)
DUE TO COVID-19 ONLY TWO VISITORS  (aged 42 and older)  ARE ALLOWED TO COME WITH YOU AND STAY IN THE WAITING ROOM ONLY DURING PRE OP AND PROCEDURE.   **NO VISITORS ARE ALLOWED IN THE SHORT STAY AREA OR RECOVERY ROOM!!**  IF YOU WILL BE ADMITTED INTO THE HOSPITAL YOU ARE ALLOWED ONLY FOUR SUPPORT PEOPLE DURING VISITATION HOURS ONLY (7 AM -8PM)   The support person(s) must pass our screening, gel in and out, and wear a mask at all times, including in the patient's room. Patients must also wear a mask when staff or their support person are in the room. Visitors GUEST BADGE MUST BE WORN VISIBLY  One adult visitor may remain with you overnight and MUST be in the room by 8 P.M.     Your procedure is scheduled on: 05/05/22   Report to [facility] Main Entrance    Report to Short stay at: 5:15 AM   Call this number if you have problems the morning of surgery [phone]   Do not eat food :After Midnight.   After Midnight you may have the following liquids until : 4:30 AM DAY OF SURGERY  Water Black Coffee (sugar ok, NO MILK/CREAM OR CREAMERS)  Tea (sugar ok, NO MILK/CREAM OR CREAMERS) regular and decaf                             Plain Jell-O (NO RED)                                           Fruit ices (not with fruit pulp, NO RED)                                     Popsicles (NO RED)                                                                  Juice: apple, WHITE grape, WHITE cranberry Sports drinks like Gatorade (NO RED) Clear broth(vegetable,chicken,beef)    Drink Ensure drink AT : 4:30  AM the day of surgery.     The day of surgery:  Drink ONE (1) Pre-Surgery Clear Ensure or G2 at AM the morning of surgery. Drink in one sitting. Do not sip.  This drink was given to you during your hospital  pre-op appointment visit. Nothing else to drink after completing the  Pre-Surgery Clear Ensure or G2.          If you have questions, please contact your surgeon's office.      Oral Hygiene is also important to reduce your risk of infection.                                    Remember - BRUSH YOUR TEETH THE MORNING OF SURGERY WITH YOUR REGULAR TOOTHPASTE   Do NOT smoke after Midnight   Take these medicines the morning of surgery with A SIP OF WATER: gabapentin,kepra,fluoxetine,apresoline.Alprazolam as needed.Inhalers  as usual.  DO NOT TAKE ANY ORAL DIABETIC MEDICATIONS DAY OF YOUR SURGERY  Bring CPAP mask and tubing day of surgery.                              You may not have any metal on your body including hair pins, jewelry, and body piercing             Do not wear make-up, lotions, powders, perfumes/cologne, or deodorant  Do not wear nail polish including gel and S&S, artificial/acrylic nails, or any other type of covering on natural nails including finger and toenails. If you have artificial nails, gel coating, etc. that needs to be removed by a nail salon please have this removed prior to surgery or surgery may need to be canceled/ delayed if the surgeon/ anesthesia feels like they are unable to be safely monitored.   Do not shave  48 hours prior to surgery.    Do not bring valuables to the hospital. [facility] IS NOT             RESPONSIBLE   FOR VALUABLES.   Contacts, dentures or bridgework may not be worn into surgery.   Bring small overnight bag day of surgery.    Patients discharged on the day of surgery will not be allowed to drive home.  Someone NEEDS to stay with you for the first 24 hours after anesthesia.   Special Instructions: Bring a copy of your healthcare power of attorney and living will documents         the day of surgery if you haven't scanned them before.              Please read over the following fact sheets you were given: IF YOU HAVE QUESTIONS ABOUT YOUR PRE-OP INSTRUCTIONS PLEASE CALL [phone]     [facility] Before surgery, you can play an important role.  Because skin is not sterile, your  skin needs to be as free of germs as possible.  You can reduce the number of germs on your skin by washing with CHG (chlorahexidine gluconate) soap before surgery.  CHG is an antiseptic cleaner which kills germs and bonds with the skin to continue killing germs even after washing. Please DO NOT use if you have an allergy to CHG or antibacterial soaps.  If your skin becomes reddened/irritated stop using the CHG and inform your nurse when you arrive at Short Stay. Do not shave (including legs and underarms) for at least 48 hours prior to the first CHG shower.  You may shave your face/neck. Please follow these instructions carefully:  1.  Shower with CHG Soap the night before surgery and the  morning of Surgery.  2.  If you choose to wash your hair, wash your hair first as usual with your  normal  shampoo.  3.  After you shampoo, rinse your hair and body thoroughly to remove the  shampoo.                           4.  Use CHG as you would any other liquid soap.  You can apply chg directly  to the skin and wash                       Gently with a scrungie or clean washcloth.  5.  Apply  the CHG Soap to your body ONLY FROM THE NECK DOWN.   Do not use on face/ open                           Wound or open sores. Avoid contact with eyes, ears mouth and genitals (private parts).                       Wash face,  Genitals (private parts) with your normal soap.             6.  Wash thoroughly, paying special attention to the area where your surgery  will be performed.  7.  Thoroughly rinse your body with warm water from the neck down.  8.  DO NOT shower/wash with your normal soap after using and rinsing off  the CHG Soap.                9.  Pat yourself dry with a clean towel.            10.  Wear clean pajamas.            11.  Place clean sheets on your bed the night of your first shower and do not  sleep with pets. Day of Surgery : Do not apply any lotions/deodorants the morning of surgery.  Please wear  clean clothes to the hospital/surgery center.  FAILURE TO FOLLOW THESE INSTRUCTIONS MAY RESULT IN THE CANCELLATION OF YOUR SURGERY PATIENT SIGNATURE_________________________________  NURSE SIGNATURE__________________________________  ________________________________________________________________________

## 2022-04-26 ENCOUNTER — Encounter (HOSPITAL_COMMUNITY): Payer: Self-pay

## 2022-04-26 ENCOUNTER — Encounter (HOSPITAL_COMMUNITY)
Admission: RE | Admit: 2022-04-26 | Discharge: 2022-04-26 | Disposition: A | Discharge status: Home / Self Care | Payer: Commercial Managed Care - PPO | Source: Ambulatory Visit | Attending: Orthopaedic Surgery | Admitting: Orthopaedic Surgery

## 2022-04-26 ENCOUNTER — Other Ambulatory Visit: Payer: Self-pay

## 2022-04-26 ENCOUNTER — Other Ambulatory Visit: Payer: Self-pay | Admitting: Physician Assistant

## 2022-04-26 VITALS — BP 120/87 | HR 81 | Temp 98.1°F | Ht 64.0 in

## 2022-04-26 DIAGNOSIS — Z01812 Encounter for preprocedural laboratory examination: Secondary | ICD-10-CM | POA: Diagnosis present

## 2022-04-26 DIAGNOSIS — M87051 Idiopathic aseptic necrosis of right femur: Secondary | ICD-10-CM

## 2022-04-26 DIAGNOSIS — Z01818 Encounter for other preprocedural examination: Secondary | ICD-10-CM

## 2022-04-26 DIAGNOSIS — I10 Essential (primary) hypertension: Secondary | ICD-10-CM

## 2022-04-26 LAB — CBC
HCT: 41 % (ref 36.0–46.0)
Hemoglobin: 13 g/dL (ref 12.0–15.0)
MCH: 30.7 pg (ref 26.0–34.0)
MCHC: 31.7 g/dL (ref 30.0–36.0)
MCV: 96.7 fL (ref 80.0–100.0)
Platelets: 322 10*3/uL (ref 150–400)
RBC: 4.24 MIL/uL (ref 3.87–5.11)
RDW: 12.5 % (ref 11.5–15.5)
WBC: 6.2 10*3/uL (ref 4.0–10.5)
nRBC: 0 % (ref 0.0–0.2)

## 2022-04-26 LAB — BASIC METABOLIC PANEL
Anion gap: 6 (ref 5–15)
BUN: 7 mg/dL (ref 6–20)
CO2: 29 mmol/L (ref 22–32)
Calcium: 9 mg/dL (ref 8.9–10.3)
Chloride: 103 mmol/L (ref 98–111)
Creatinine, Ser: 0.65 mg/dL (ref 0.44–1.00)
GFR, Estimated: >60 mL/min (ref >60)
Glucose, Bld: 98 mg/dL (ref 70–99)
Potassium: 3.5 mmol/L (ref 3.5–5.1)
Sodium: 138 mmol/L (ref 135–145)

## 2022-04-26 LAB — SURGICAL PCR SCREEN
MRSA, PCR: NEGATIVE
Staphylococcus aureus: POSITIVE — AB

## 2022-04-26 NOTE — Progress Notes (Addendum)
For Short Stay: COVID SWAB appointment date: Date of COVID positive in last 90 days:  Bowel Prep reminder:   For Anesthesia: PCP - Dr. Trena Platt Cardiologist - N/A  Chest x-ray -  EKG - 01/19/22 Stress Test -  ECHO - 04/17/11 Cardiac Cath -  Pacemaker/ICD device last checked: Pacemaker orders received: Device Rep notified:  Spinal Cord Stimulator:  Sleep Study -  CPAP -   Fasting Blood Sugar -  Checks Blood Sugar _____ times a day Date and result of last Hgb A1c-  Blood Thinner Instructions: Aspirin Instructions: Last Dose:  Activity level: Can go up a flight of stairs and activities of daily living without stopping and without chest pain and/or shortness of breath   Able to exercise without chest pain and/or shortness of breath   Unable to go up a flight of stairs without chest pain and/or shortness of breath     Anesthesia review: Hx: Smoker,HTN,,palpitations,PVC,seizures (last on 01/15/22)  Patient denies shortness of breath, fever, cough and chest pain at PAT appointment   Patient verbalized understanding of instructions that were given to them at the PAT appointment. Patient was also instructed that they will need to review over the PAT instructions again at home before surgery.

## 2022-04-26 NOTE — Progress Notes (Signed)
PCR: + STAPH °

## 2022-04-28 ENCOUNTER — Ambulatory Visit: Payer: Commercial Managed Care - PPO | Admitting: Internal Medicine

## 2022-05-04 NOTE — H&P (Signed)
TOTAL HIP ADMISSION H&P  Patient is admitted for right total hip arthroplasty.  Subjective:  Chief Complaint: right hip pain  HPI: Kelsey [name], 41 y.o. female, has a history of pain and functional disability in the right hip(s) due to  avascular necrosis  and patient has failed non-surgical conservative treatments for greater than 12 weeks to include NSAID's and/or analgesics and activity modification.  Onset of symptoms was abrupt starting 1 years ago with rapidlly worsening course since that time.The patient noted no past surgery on the right hip(s).  Patient currently rates pain in the right hip at 10 out of 10 with activity. Patient has night pain, worsening of pain with activity and weight bearing, pain that interfers with activities of daily living, and pain with passive range of motion. Patient has evidence of subchondral cysts and avascular necrosis  by imaging studies. This condition presents safety issues increasing the risk of falls. This patient has had avascular necrosis of the hip, acetabular fracture, hip dysplasia.  There is no current active infection.  Patient Active Problem List   Diagnosis Date Noted   Hospital discharge follow-up 02/23/2022   Status post left hip replacement 02/17/2022   Transaminitis 01/26/2022   Macrocytic anemia 01/26/2022   Thrombocytopenia (HCC) 01/18/2022   Avascular necrosis of bone of left hip (HCC) 12/20/2021   Avascular necrosis of bone of right hip (HCC) 12/20/2021   Lumbar spondylosis 10/05/2021   Positive double stranded DNA antibody test 09/07/2021   Mixed hyperlipidemia 08/16/2021   Seizure disorder (HCC) 07/28/2021   Chronic hip pain 07/28/2021   Essential hypertension 07/28/2021   Tobacco abuse 07/28/2021   Polyarthritis 07/28/2021   Anal skin tag 10/21/2019   Internal hemorrhoids 09/26/2017   Asthma 10/15/2015   Allergic rhinitis 10/15/2015   Alcohol abuse 07/14/2015   GAD (generalized anxiety disorder) 07/14/2015    Depression, major, recurrent, moderate (HCC) 07/14/2015   GERD (gastroesophageal reflux disease) 08/22/2013   Past Medical History:  Diagnosis Date   Anxiety    Asthma    hosp. 2018 and 2019 no h/o intubation    Depression    GERD (gastroesophageal reflux disease)    Hypertension    Palpitations    Perianal abscess 11/22/2016   12/07/2016.  [facility], [place].  Ovidio Kin, MD.  Perianal abscess is much better but still needs local wound care.  Continue sitz baths 2-3 times perday until all drainage and pain are gone.  Follow up as needed.   Preop general physical exam 01/26/2022   PVC (premature ventricular contraction)    Seasonal allergies    Seizures (HCC)    01/15/22 was last seizure   Tachycardia     Past Surgical History:  Procedure Laterality Date   CESAREAN SECTION  10/15/2009, 02/08/2006   INCISION AND DRAINAGE PERIRECTAL ABSCESS Bilateral 11/22/2016   Procedure: IRRIGATION AND DEBRIDEMENT PERIRECTAL ABSCESS;  Surgeon: Ovidio Kin, MD;  Location: [facility] ORS;  Service: General;  Laterality: Bilateral;   INTRAUTERINE DEVICE INSERTION     Mirena 12/24/19 exp 12/28/21 Dr Philip Aspen [facility]   TOTAL HIP ARTHROPLASTY Left 02/17/2022   Procedure: Left TOTAL HIP ARTHROPLASTY ANTERIOR APPROACH;  Surgeon: Kathryne Hitch, MD;  Location: [facility] OR[facility];  Service: Orthopedics;  Laterality: Left;    No current facility-administered medications for this encounter.   Current Outpatient Medications  Medication Sig Dispense Refill Last Dose   albuterol (VENTOLIN HFA) 108 (90 Base) MCG/ACT inhaler Inhale 1-2 puffs into the lungs every 6 (six)  hours as needed for wheezing or shortness of breath. 1 each 11    ALPRAZolam (XANAX) 1 MG tablet Take 1 tablet (1 mg total) by mouth at bedtime as needed for anxiety. (Patient taking differently: Take 1 mg by mouth 3 (three) times daily.) 7 tablet 0    cyclobenzaprine (FLEXERIL) 10 MG tablet Take 1 tablet (10 mg total) by mouth 3  (three) times daily as needed for muscle spasms. 60 tablet 2    diphenhydrAMINE (BENADRYL) 25 MG tablet Take 25 mg by mouth every 6 (six) hours as needed for allergies.      FLUoxetine (PROZAC) 20 MG capsule Take 80 mg by mouth daily.      gabapentin (NEURONTIN) 300 MG capsule TAKE 2 CAPSULES (600 MG TOTAL) BY MOUTH 3 (THREE) TIMES DAILY. (Patient taking differently: Take 1,200 mg by mouth 4 (four) times daily.) 540 capsule 1    HYDROcodone-acetaminophen (NORCO/VICODIN) 5-325 MG tablet Take 1 tablet by mouth every 6 (six) hours as needed for moderate pain. 30 tablet 0    levETIRAcetam (KEPPRA) 500 MG tablet Take 1 tablet (500 mg total) by mouth 2 (two) times daily. 60 tablet 1    levonorgestrel (MIRENA) 20 MCG/24HR IUD 1 Intra Uterine Device (1 each total) by Intrauterine route once. 1 each 0    telmisartan-hydrochlorothiazide (MICARDIS HCT) 40-12.5 MG tablet TAKE 1 TABLET BY MOUTH EVERY DAY 90 tablet 1    aspirin 81 MG chewable tablet Chew 1 tablet (81 mg total) by mouth 2 (two) times daily. (Patient not taking: Reported on 04/20/2022) 30 tablet 0 Not Taking   hydrALAZINE (APRESOLINE) 25 MG tablet TAKE 1 TABLET BY MOUTH THREE TIMES A DAY 270 tablet 2    naproxen (NAPROSYN) 500 MG tablet TAKE 1 TABLET BY MOUTH 2 TIMES DAILY WITH A MEAL. (Patient not taking: Reported on 04/20/2022) 60 tablet 5 Not Taking   oxyCODONE (OXY IR/ROXICODONE) 5 MG immediate release tablet Take 1-2 tablets (5-10 mg total) by mouth every 6 (six) hours as needed for moderate pain (pain score 4-6). (Patient not taking: Reported on 04/20/2022) 40 tablet 0 Not Taking   Allergies  Allergen Reactions   Penicillins Rash    Has patient had a PCN reaction causing immediate rash, facial/tongue/throat swelling, SOB or lightheadedness with hypotension: No Has patient had a PCN reaction causing severe rash involving mucus membranes or skin necrosis: No Has patient had a PCN reaction that required hospitalization No Has patient had a PCN  reaction occurring within the last 10 years: No If all of the above answers are "NO", then may proceed with Cephalosporin use.    Social History   Tobacco Use   Smoking status: Every Day    Packs/day: 0.25    Years: 10.00    Total pack years: 2.50    Types: Cigarettes   Smokeless tobacco: Never  Substance Use Topics   Alcohol use: No    Family History  Problem Relation Age of Onset   Rheumatologic disease Mother    Arthritis Mother    Asthma Mother    Depression Mother    Heart disease Father    Asthma Maternal Grandmother    Hypertension Maternal Grandmother    Heart disease Maternal Grandfather    Lung cancer Paternal Grandmother    Healthy Daughter    Healthy Son      Review of Systems  All other systems reviewed and are negative.   Objective:  Physical Exam Vitals reviewed.  Constitutional:  Appearance: Normal appearance.  HENT:     Head: Normocephalic and atraumatic.  Eyes:     Extraocular Movements: Extraocular movements intact.     Pupils: Pupils are equal, round, and reactive to light.  Cardiovascular:     Rate and Rhythm: Normal rate and regular rhythm.  Pulmonary:     Effort: Pulmonary effort is normal.     Breath sounds: Normal breath sounds.  Abdominal:     Palpations: Abdomen is soft.  Musculoskeletal:     Cervical back: Normal range of motion and neck supple.     Right hip: Tenderness and bony tenderness present. Decreased range of motion. Decreased strength.  Neurological:     Mental Status: She is alert and oriented to person, place, and time.  Psychiatric:        Behavior: Behavior normal.     Vital signs in last 24 hours:    Labs:   Estimated body mass index is 24.6 kg/m as calculated from the following:   Height as of 04/26/22: 5\' 4"  (1.626 m).   Weight as of 02/17/22: 65 kg.   Imaging Review Plain radiographs demonstrate severe AVN of the right hip(s). The bone quality appears to be excellent for age and reported  activity level.      Assessment/Plan:  Avascular necrosis, right hip(s)  The patient history, physical examination, clinical judgement of the provider and imaging studies are consistent with AVN of the right hip(s) and total hip arthroplasty is deemed medically necessary. The treatment options including medical management, injection therapy, arthroscopy and arthroplasty were discussed at length. The risks and benefits of total hip arthroplasty were presented and reviewed. The risks due to aseptic loosening, infection, stiffness, dislocation/subluxation,  thromboembolic complications and other imponderables were discussed.  The patient acknowledged the explanation, agreed to proceed with the plan and consent was signed. Patient is being admitted for inpatient treatment for surgery, pain control, PT, OT, prophylactic antibiotics, VTE prophylaxis, progressive ambulation and ADL's and discharge planning.The patient is planning to be discharged home with home health services

## 2022-05-04 NOTE — Anesthesia Preprocedure Evaluation (Addendum)
Anesthesia Evaluation  Patient identified by MRN, date of birth, ID band Patient awake    Reviewed: Allergy & Precautions, NPO status , Patient's Chart, lab work & pertinent test results  Airway Mallampati: II  TM Distance: >3 FB Neck ROM: Full    Dental no notable dental hx. (+) Teeth Intact, Dental Advisory Given   Pulmonary asthma , Current Smoker and Patient abstained from smoking.,    Pulmonary exam normal breath sounds clear to auscultation       Cardiovascular hypertension, Normal cardiovascular exam Rhythm:Regular Rate:Normal     Neuro/Psych Seizures -,  PSYCHIATRIC DISORDERS Anxiety Depression    GI/Hepatic Neg liver ROS, GERD  Medicated,  Endo/Other    Renal/GU Lab Results      Component                Value               Date                      CREATININE               0.65                04/26/2022               K                        3.5                 04/26/2022                    Musculoskeletal  (+) Arthritis , Osteoarthritis,    Abdominal   Peds  Hematology Lab Results      Component                Value               Date                         HGB                      13.0                04/26/2022                HCT                      41.0                04/26/2022                 PLT                      322                 04/26/2022              Anesthesia Other Findings All:PCN  Reproductive/Obstetrics                            Anesthesia Physical Anesthesia Plan  ASA: 2  Anesthesia Plan: Spinal   Post-op Pain Management: Regional block*   Induction: Intravenous  PONV Risk Score and Plan: 2 and Treatment may vary due to age or medical  condition, Midazolam and Ondansetron  Airway Management Planned: Nasal Cannula and Natural Airway  Additional Equipment: None  Intra-op Plan:   Post-operative Plan:   Informed Consent: I have reviewed the  patients History and Physical, chart, labs and discussed the procedure including the risks, benefits and alternatives for the proposed anesthesia with the patient or authorized representative who has indicated his/her understanding and acceptance.     Dental advisory given  Plan Discussed with: CRNA and Anesthesiologist  Anesthesia Plan Comments: (R THR for AVN)       Anesthesia Quick Evaluation

## 2022-05-05 ENCOUNTER — Other Ambulatory Visit: Payer: Self-pay | Admitting: Orthopaedic Surgery

## 2022-05-05 ENCOUNTER — Ambulatory Visit (HOSPITAL_COMMUNITY): Payer: Commercial Managed Care - PPO

## 2022-05-05 ENCOUNTER — Encounter (HOSPITAL_COMMUNITY): Payer: Self-pay | Admitting: Orthopaedic Surgery

## 2022-05-05 ENCOUNTER — Other Ambulatory Visit: Payer: Self-pay

## 2022-05-05 ENCOUNTER — Encounter (HOSPITAL_COMMUNITY)
Admission: RE | Disposition: A | Discharge status: Home / Self Care | Payer: Self-pay | Source: Ambulatory Visit | Attending: Orthopaedic Surgery

## 2022-05-05 ENCOUNTER — Encounter: Payer: Self-pay | Admitting: Orthopaedic Surgery

## 2022-05-05 ENCOUNTER — Ambulatory Visit (HOSPITAL_BASED_OUTPATIENT_CLINIC_OR_DEPARTMENT_OTHER): Payer: Commercial Managed Care - PPO | Admitting: Certified Registered Nurse Anesthetist

## 2022-05-05 ENCOUNTER — Observation Stay (HOSPITAL_COMMUNITY)
Admission: RE | Admit: 2022-05-05 | Discharge: 2022-05-06 | Disposition: A | Discharge status: Home / Self Care | Payer: Commercial Managed Care - PPO | Source: Ambulatory Visit | Attending: Orthopaedic Surgery | Admitting: Orthopaedic Surgery

## 2022-05-05 ENCOUNTER — Ambulatory Visit (HOSPITAL_COMMUNITY): Payer: Commercial Managed Care - PPO | Admitting: Physician Assistant

## 2022-05-05 ENCOUNTER — Observation Stay (HOSPITAL_COMMUNITY): Payer: Commercial Managed Care - PPO

## 2022-05-05 DIAGNOSIS — J4520 Mild intermittent asthma, uncomplicated: Secondary | ICD-10-CM

## 2022-05-05 DIAGNOSIS — M879 Osteonecrosis, unspecified: Secondary | ICD-10-CM

## 2022-05-05 DIAGNOSIS — M87051 Idiopathic aseptic necrosis of right femur: Secondary | ICD-10-CM | POA: Diagnosis present

## 2022-05-05 DIAGNOSIS — Z96641 Presence of right artificial hip joint: Secondary | ICD-10-CM

## 2022-05-05 DIAGNOSIS — J45909 Unspecified asthma, uncomplicated: Secondary | ICD-10-CM | POA: Diagnosis not present

## 2022-05-05 DIAGNOSIS — M130 Polyarthritis, unspecified: Secondary | ICD-10-CM

## 2022-05-05 DIAGNOSIS — Z7982 Long term (current) use of aspirin: Secondary | ICD-10-CM | POA: Diagnosis not present

## 2022-05-05 DIAGNOSIS — I10 Essential (primary) hypertension: Secondary | ICD-10-CM | POA: Insufficient documentation

## 2022-05-05 DIAGNOSIS — Z01818 Encounter for other preprocedural examination: Secondary | ICD-10-CM

## 2022-05-05 DIAGNOSIS — Z96652 Presence of left artificial knee joint: Secondary | ICD-10-CM | POA: Diagnosis not present

## 2022-05-05 DIAGNOSIS — Z79899 Other long term (current) drug therapy: Secondary | ICD-10-CM | POA: Insufficient documentation

## 2022-05-05 DIAGNOSIS — F17210 Nicotine dependence, cigarettes, uncomplicated: Secondary | ICD-10-CM | POA: Insufficient documentation

## 2022-05-05 HISTORY — PX: TOTAL HIP ARTHROPLASTY: SHX124

## 2022-05-05 LAB — TYPE AND SCREEN
ABO/RH(D): O POS
Antibody Screen: NEGATIVE

## 2022-05-05 LAB — PREGNANCY, URINE: Preg Test, Ur: NEGATIVE

## 2022-05-05 SURGERY — ARTHROPLASTY, HIP, TOTAL, ANTERIOR APPROACH
Anesthesia: Spinal | Site: Hip | Laterality: Right

## 2022-05-05 MED ORDER — 0.9 % SODIUM CHLORIDE (POUR BTL) OPTIME
TOPICAL | Status: DC | PRN
  Administered 2022-05-05: 1000 mL

## 2022-05-05 MED ORDER — ALBUTEROL SULFATE (2.5 MG/3ML) 0.083% IN NEBU
2.5000 mg | INHALATION_SOLUTION | Freq: Four times a day (QID) | RESPIRATORY_TRACT | Status: DC | PRN
Start: 2022-05-05 — End: 2022-05-06

## 2022-05-05 MED ORDER — IRBESARTAN 150 MG PO TABS
150.0000 mg | ORAL_TABLET | Freq: Every day | ORAL | Status: DC
  Administered 2022-05-05: 150 mg via ORAL
  Filled 2022-05-05 (×2): qty 1

## 2022-05-05 MED ORDER — AMISULPRIDE (ANTIEMETIC) 5 MG/2ML IV SOLN: 10.0000 mg | Freq: Once | INTRAVENOUS | Status: DC | PRN

## 2022-05-05 MED ORDER — LACTATED RINGERS IV SOLN: INTRAVENOUS | Status: DC

## 2022-05-05 MED ORDER — PANTOPRAZOLE SODIUM 40 MG PO TBEC
40.0000 mg | DELAYED_RELEASE_TABLET | Freq: Every day | ORAL | Status: DC
  Filled 2022-05-05 (×2): qty 1

## 2022-05-05 MED ORDER — PROPOFOL 10 MG/ML IV BOLUS
INTRAVENOUS | Status: DC | PRN
  Administered 2022-05-05 (×3): 20 mg via INTRAVENOUS

## 2022-05-05 MED ORDER — VANCOMYCIN HCL IN DEXTROSE 1-5 GM/200ML-% IV SOLN: 1000.0000 mg | Freq: Once | INTRAVENOUS | Status: DC

## 2022-05-05 MED ORDER — CYCLOBENZAPRINE HCL 10 MG PO TABS: 10.0000 mg | ORAL_TABLET | Freq: Three times a day (TID) | ORAL | Status: DC | PRN

## 2022-05-05 MED ORDER — OXYCODONE HCL 5 MG PO TABS
5.0000 mg | ORAL_TABLET | Freq: Once | ORAL | Status: AC | PRN
  Administered 2022-05-05: 5 mg via ORAL

## 2022-05-05 MED ORDER — STERILE WATER FOR IRRIGATION IR SOLN
Status: DC | PRN
  Administered 2022-05-05: 2000 mL

## 2022-05-05 MED ORDER — MENTHOL 3 MG MT LOZG: 1.0000 | LOZENGE | OROMUCOSAL | Status: DC | PRN

## 2022-05-05 MED ORDER — IPRATROPIUM-ALBUTEROL 0.5-2.5 (3) MG/3ML IN SOLN
3.0000 mL | Freq: Once | RESPIRATORY_TRACT | Status: AC
Start: 2022-05-05 — End: 2022-05-05
  Administered 2022-05-05: 3 mL via RESPIRATORY_TRACT
  Filled 2022-05-05: qty 3

## 2022-05-05 MED ORDER — ONDANSETRON HCL 4 MG/2ML IJ SOLN
INTRAMUSCULAR | Status: DC | PRN
  Administered 2022-05-05: 4 mg via INTRAVENOUS

## 2022-05-05 MED ORDER — HYDROCODONE-ACETAMINOPHEN 7.5-325 MG PO TABS
1.0000 | ORAL_TABLET | Freq: Four times a day (QID) | ORAL | 0 refills | Status: DC | PRN
Start: 2022-05-05 — End: 2022-05-06

## 2022-05-05 MED ORDER — HYDROMORPHONE HCL 1 MG/ML IJ SOLN
0.5000 mg | INTRAMUSCULAR | Status: DC | PRN
  Administered 2022-05-05 (×3): 1 mg via INTRAVENOUS
  Filled 2022-05-05 (×3): qty 1

## 2022-05-05 MED ORDER — ASPIRIN 81 MG PO CHEW: 81.0000 mg | CHEWABLE_TABLET | Freq: Two times a day (BID) | ORAL | 0 refills | Status: DC

## 2022-05-05 MED ORDER — ALUM & MAG HYDROXIDE-SIMETH 200-200-20 MG/5ML PO SUSP: 30.0000 mL | ORAL | Status: DC | PRN

## 2022-05-05 MED ORDER — ONDANSETRON HCL 4 MG/2ML IJ SOLN
4.0000 mg | Freq: Once | INTRAMUSCULAR | Status: AC | PRN
  Administered 2022-05-05: 4 mg via INTRAVENOUS

## 2022-05-05 MED ORDER — MIDAZOLAM HCL 5 MG/5ML IJ SOLN
INTRAMUSCULAR | Status: DC | PRN
  Administered 2022-05-05: 2 mg via INTRAVENOUS

## 2022-05-05 MED ORDER — FENTANYL CITRATE (PF) 100 MCG/2ML IJ SOLN
INTRAMUSCULAR | Status: DC | PRN
Start: 2022-05-05 — End: 2022-05-05
  Administered 2022-05-05 (×2): 50 ug via INTRAVENOUS

## 2022-05-05 MED ORDER — VANCOMYCIN HCL IN DEXTROSE 1-5 GM/200ML-% IV SOLN
1000.0000 mg | Freq: Two times a day (BID) | INTRAVENOUS | Status: AC
  Administered 2022-05-05: 1000 mg via INTRAVENOUS
  Filled 2022-05-05: qty 200

## 2022-05-05 MED ORDER — METHOCARBAMOL 500 MG PO TABS: 500.0000 mg | ORAL_TABLET | Freq: Four times a day (QID) | ORAL | Status: DC | PRN

## 2022-05-05 MED ORDER — POVIDONE-IODINE 10 % EX SWAB
2.0000 "application " | Freq: Once | CUTANEOUS | Status: AC
  Administered 2022-05-05: 2 via TOPICAL

## 2022-05-05 MED ORDER — BUPIVACAINE IN DEXTROSE 0.75-8.25 % IT SOLN
INTRATHECAL | Status: DC | PRN
  Administered 2022-05-05: 12 mg via INTRATHECAL

## 2022-05-05 MED ORDER — PROPOFOL 500 MG/50ML IV EMUL
INTRAVENOUS | Status: DC | PRN
  Administered 2022-05-05: 100 ug/kg/min via INTRAVENOUS

## 2022-05-05 MED ORDER — HYDROCHLOROTHIAZIDE 12.5 MG PO TABS
12.5000 mg | ORAL_TABLET | Freq: Every day | ORAL | Status: DC
  Administered 2022-05-05: 12.5 mg via ORAL
  Filled 2022-05-05 (×2): qty 1

## 2022-05-05 MED ORDER — CYCLOBENZAPRINE HCL 10 MG PO TABS: 10.0000 mg | ORAL_TABLET | Freq: Three times a day (TID) | ORAL | 2 refills | Status: DC | PRN

## 2022-05-05 MED ORDER — OXYCODONE HCL 5 MG/5ML PO SOLN: 5.0000 mg | Freq: Once | ORAL | Status: AC | PRN

## 2022-05-05 MED ORDER — TELMISARTAN-HCTZ 40-12.5 MG PO TABS: 1.0000 | ORAL_TABLET | Freq: Every day | ORAL | Status: DC

## 2022-05-05 MED ORDER — MIDAZOLAM HCL 2 MG/2ML IJ SOLN
INTRAMUSCULAR | Status: AC
Start: 2022-05-05 — End: ?
  Filled 2022-05-05: qty 2

## 2022-05-05 MED ORDER — OXYCODONE HCL 5 MG PO TABS
5.0000 mg | ORAL_TABLET | ORAL | Status: DC | PRN
  Administered 2022-05-05 – 2022-05-06 (×2): 10 mg via ORAL
  Filled 2022-05-05 (×3): qty 2

## 2022-05-05 MED ORDER — ONDANSETRON HCL 4 MG/2ML IJ SOLN
INTRAMUSCULAR | Status: AC
  Filled 2022-05-05: qty 2

## 2022-05-05 MED ORDER — SODIUM CHLORIDE 0.9 % IV SOLN: INTRAVENOUS | Status: DC

## 2022-05-05 MED ORDER — DEXAMETHASONE SODIUM PHOSPHATE 10 MG/ML IJ SOLN
INTRAMUSCULAR | Status: DC | PRN
  Administered 2022-05-05: 6 mg via INTRAVENOUS

## 2022-05-05 MED ORDER — DIPHENHYDRAMINE HCL 12.5 MG/5ML PO ELIX: 12.5000 mg | ORAL_SOLUTION | ORAL | Status: DC | PRN

## 2022-05-05 MED ORDER — CHLORHEXIDINE GLUCONATE 0.12 % MT SOLN
15.0000 mL | Freq: Once | OROMUCOSAL | Status: AC
  Administered 2022-05-05: 15 mL via OROMUCOSAL

## 2022-05-05 MED ORDER — VANCOMYCIN HCL IN DEXTROSE 1-5 GM/200ML-% IV SOLN
1000.0000 mg | INTRAVENOUS | Status: AC
  Administered 2022-05-05: 1000 mg via INTRAVENOUS
  Filled 2022-05-05: qty 200

## 2022-05-05 MED ORDER — METHOCARBAMOL 500 MG IVPB - SIMPLE MED
500.0000 mg | Freq: Four times a day (QID) | INTRAVENOUS | Status: DC | PRN
  Administered 2022-05-05: 500 mg via INTRAVENOUS

## 2022-05-05 MED ORDER — FLUOXETINE HCL 20 MG PO CAPS
80.0000 mg | ORAL_CAPSULE | Freq: Every day | ORAL | Status: DC
  Administered 2022-05-05 – 2022-05-06 (×2): 80 mg via ORAL
  Filled 2022-05-05 (×2): qty 4

## 2022-05-05 MED ORDER — GABAPENTIN 300 MG PO CAPS
900.0000 mg | ORAL_CAPSULE | Freq: Four times a day (QID) | ORAL | Status: DC
  Administered 2022-05-05 – 2022-05-06 (×4): 900 mg via ORAL
  Filled 2022-05-05 (×4): qty 3

## 2022-05-05 MED ORDER — ONDANSETRON HCL 4 MG PO TABS: 4.0000 mg | ORAL_TABLET | Freq: Four times a day (QID) | ORAL | Status: DC | PRN

## 2022-05-05 MED ORDER — ORAL CARE MOUTH RINSE: 15.0000 mL | Freq: Once | OROMUCOSAL | Status: AC

## 2022-05-05 MED ORDER — FENTANYL CITRATE (PF) 100 MCG/2ML IJ SOLN
INTRAMUSCULAR | Status: AC
  Filled 2022-05-05: qty 2

## 2022-05-05 MED ORDER — POLYETHYLENE GLYCOL 3350 17 G PO PACK: 17.0000 g | PACK | Freq: Every day | ORAL | Status: DC | PRN

## 2022-05-05 MED ORDER — METOCLOPRAMIDE HCL 5 MG/ML IJ SOLN: 5.0000 mg | Freq: Three times a day (TID) | INTRAMUSCULAR | Status: DC | PRN

## 2022-05-05 MED ORDER — PROPOFOL 1000 MG/100ML IV EMUL
INTRAVENOUS | Status: AC
  Filled 2022-05-05: qty 100

## 2022-05-05 MED ORDER — ALBUTEROL SULFATE HFA 108 (90 BASE) MCG/ACT IN AERS: 1.0000 | INHALATION_SPRAY | Freq: Four times a day (QID) | RESPIRATORY_TRACT | 0 refills | Status: DC | PRN

## 2022-05-05 MED ORDER — DEXAMETHASONE SODIUM PHOSPHATE 10 MG/ML IJ SOLN
INTRAMUSCULAR | Status: AC
  Filled 2022-05-05: qty 1

## 2022-05-05 MED ORDER — PHENYLEPHRINE HCL-NACL 20-0.9 MG/250ML-% IV SOLN
INTRAVENOUS | Status: AC
  Filled 2022-05-05: qty 250

## 2022-05-05 MED ORDER — METOCLOPRAMIDE HCL 5 MG PO TABS: 5.0000 mg | ORAL_TABLET | Freq: Three times a day (TID) | ORAL | Status: DC | PRN

## 2022-05-05 MED ORDER — OXYCODONE HCL 5 MG PO TABS
ORAL_TABLET | ORAL | Status: AC
  Filled 2022-05-05: qty 1

## 2022-05-05 MED ORDER — ASPIRIN 81 MG PO CHEW
81.0000 mg | CHEWABLE_TABLET | Freq: Two times a day (BID) | ORAL | Status: DC
  Administered 2022-05-05 – 2022-05-06 (×2): 81 mg via ORAL
  Filled 2022-05-05 (×2): qty 1

## 2022-05-05 MED ORDER — DIPHENHYDRAMINE HCL 25 MG PO CAPS: 25.0000 mg | ORAL_CAPSULE | Freq: Four times a day (QID) | ORAL | Status: DC | PRN

## 2022-05-05 MED ORDER — ACETAMINOPHEN 325 MG PO TABS: 325.0000 mg | ORAL_TABLET | Freq: Four times a day (QID) | ORAL | Status: DC | PRN

## 2022-05-05 MED ORDER — TRANEXAMIC ACID-NACL 1000-0.7 MG/100ML-% IV SOLN
1000.0000 mg | INTRAVENOUS | Status: AC
  Administered 2022-05-05: 1000 mg via INTRAVENOUS
  Filled 2022-05-05: qty 100

## 2022-05-05 MED ORDER — HYDRALAZINE HCL 25 MG PO TABS
25.0000 mg | ORAL_TABLET | Freq: Three times a day (TID) | ORAL | Status: DC
  Administered 2022-05-05 (×3): 25 mg via ORAL
  Filled 2022-05-05 (×4): qty 1

## 2022-05-05 MED ORDER — DOCUSATE SODIUM 100 MG PO CAPS
100.0000 mg | ORAL_CAPSULE | Freq: Two times a day (BID) | ORAL | Status: DC
Start: 2022-05-05 — End: 2022-05-06
  Administered 2022-05-05 – 2022-05-06 (×3): 100 mg via ORAL
  Filled 2022-05-05 (×3): qty 1

## 2022-05-05 MED ORDER — LEVETIRACETAM 500 MG PO TABS
500.0000 mg | ORAL_TABLET | Freq: Two times a day (BID) | ORAL | Status: DC
  Administered 2022-05-05 – 2022-05-06 (×3): 500 mg via ORAL
  Filled 2022-05-05 (×4): qty 1

## 2022-05-05 MED ORDER — OXYCODONE HCL 5 MG PO TABS
10.0000 mg | ORAL_TABLET | ORAL | Status: DC | PRN
  Administered 2022-05-05: 15 mg via ORAL
  Administered 2022-05-06: 10 mg via ORAL
  Filled 2022-05-05: qty 3

## 2022-05-05 MED ORDER — METHOCARBAMOL 500 MG IVPB - SIMPLE MED
INTRAVENOUS | Status: AC
  Filled 2022-05-05: qty 50

## 2022-05-05 MED ORDER — ONDANSETRON HCL 4 MG/2ML IJ SOLN: 4.0000 mg | Freq: Four times a day (QID) | INTRAMUSCULAR | Status: DC | PRN

## 2022-05-05 MED ORDER — PHENOL 1.4 % MT LIQD: 1.0000 | OROMUCOSAL | Status: DC | PRN

## 2022-05-05 MED ORDER — ACETAMINOPHEN 10 MG/ML IV SOLN
1000.0000 mg | Freq: Once | INTRAVENOUS | Status: DC | PRN
  Administered 2022-05-05: 1000 mg via INTRAVENOUS

## 2022-05-05 MED ORDER — HYDROCODONE-ACETAMINOPHEN 7.5-325 MG PO TABS: 1.0000 | ORAL_TABLET | Freq: Four times a day (QID) | ORAL | 0 refills | Status: DC | PRN

## 2022-05-05 MED ORDER — HYDROMORPHONE HCL 1 MG/ML IJ SOLN: 0.2500 mg | INTRAMUSCULAR | Status: DC | PRN

## 2022-05-05 MED ORDER — SODIUM CHLORIDE 0.9 % IR SOLN
Status: DC | PRN
  Administered 2022-05-05: 1000 mL

## 2022-05-05 MED ORDER — ALPRAZOLAM 1 MG PO TABS
1.0000 mg | ORAL_TABLET | Freq: Three times a day (TID) | ORAL | Status: DC
  Administered 2022-05-05 – 2022-05-06 (×4): 1 mg via ORAL
  Filled 2022-05-05 (×4): qty 1

## 2022-05-05 MED ORDER — ACETAMINOPHEN 10 MG/ML IV SOLN
INTRAVENOUS | Status: AC
  Filled 2022-05-05: qty 100

## 2022-05-05 SURGICAL SUPPLY — 44 items
APL SKNCLS STERI-STRIP NONHPOA (GAUZE/BANDAGES/DRESSINGS)
BAG COUNTER SPONGE SURGICOUNT (BAG) ×2 IMPLANT
BAG SPEC THK2 15X12 ZIP CLS (MISCELLANEOUS)
BAG SPNG CNTER NS LX DISP (BAG) ×1
BAG ZIPLOCK 12X15 (MISCELLANEOUS) IMPLANT
BENZOIN TINCTURE PRP APPL 2/3 (GAUZE/BANDAGES/DRESSINGS) IMPLANT
BLADE SAW SGTL 18X1.27X75 (BLADE) ×2 IMPLANT
CLSR STERI-STRIP ANTIMIC 1/2X4 (GAUZE/BANDAGES/DRESSINGS) ×1 IMPLANT
COVER PERINEAL POST (MISCELLANEOUS) ×2 IMPLANT
COVER SURGICAL LIGHT HANDLE (MISCELLANEOUS) ×2 IMPLANT
CUP SECTOR GRIPTON 50MM (Cup) ×1 IMPLANT
DRAPE FOOT SWITCH (DRAPES) ×2 IMPLANT
DRAPE STERI IOBAN 125X83 (DRAPES) ×2 IMPLANT
DRAPE U-SHAPE 47X51 STRL (DRAPES) ×4 IMPLANT
DRSG AQUACEL AG ADV 3.5X10 (GAUZE/BANDAGES/DRESSINGS) ×2 IMPLANT
DURAPREP 26ML APPLICATOR (WOUND CARE) ×2 IMPLANT
ELECT REM PT RETURN 15FT ADLT (MISCELLANEOUS) ×2 IMPLANT
GAUZE XEROFORM 1X8 LF (GAUZE/BANDAGES/DRESSINGS) ×2 IMPLANT
GLOVE BIO SURGEON STRL SZ7.5 (GLOVE) ×2 IMPLANT
GLOVE BIOGEL PI IND STRL 8 (GLOVE) ×2 IMPLANT
GLOVE BIOGEL PI INDICATOR 8 (GLOVE) ×2
GLOVE ECLIPSE 8.0 STRL XLNG CF (GLOVE) ×2 IMPLANT
GOWN STRL REUS W/ TWL XL LVL3 (GOWN DISPOSABLE) ×2 IMPLANT
GOWN STRL REUS W/TWL XL LVL3 (GOWN DISPOSABLE) ×4
HANDPIECE INTERPULSE COAX TIP (DISPOSABLE) ×2
HEAD FEMORAL 32 CERAMIC (Hips) ×1 IMPLANT
HOLDER FOLEY CATH W/STRAP (MISCELLANEOUS) ×2 IMPLANT
KIT TURNOVER KIT A (KITS) ×1 IMPLANT
LINER ACETABULAR 32X50 (Liner) ×1 IMPLANT
PACK ANTERIOR HIP CUSTOM (KITS) ×2 IMPLANT
SCREW 6.5MMX25MM (Screw) ×1 IMPLANT
SET HNDPC FAN SPRY TIP SCT (DISPOSABLE) ×1 IMPLANT
STAPLER VISISTAT 35W (STAPLE) IMPLANT
STEM FEM ACTIS STD SZ1 (Stem) ×1 IMPLANT
STRIP CLOSURE SKIN 1/2X4 (GAUZE/BANDAGES/DRESSINGS) IMPLANT
SUT ETHIBOND NAB CT1 #1 30IN (SUTURE) ×2 IMPLANT
SUT ETHILON 2 0 PS N (SUTURE) IMPLANT
SUT MNCRL AB 4-0 PS2 18 (SUTURE) IMPLANT
SUT VIC AB 0 CT1 36 (SUTURE) ×2 IMPLANT
SUT VIC AB 1 CT1 36 (SUTURE) ×2 IMPLANT
SUT VIC AB 2-0 CT1 27 (SUTURE) ×4
SUT VIC AB 2-0 CT1 TAPERPNT 27 (SUTURE) ×2 IMPLANT
TRAY FOLEY MTR SLVR 16FR STAT (SET/KITS/TRAYS/PACK) ×1 IMPLANT
YANKAUER SUCT BULB TIP NO VENT (SUCTIONS) ×2 IMPLANT

## 2022-05-05 NOTE — Anesthesia Procedure Notes (Signed)
Procedure Name: MAC Date/Time: 05/05/2022 7:14 AM  Performed by: West Pugh, CRNAPre-anesthesia Checklist: Patient identified, Emergency Drugs available, Suction available, Patient being monitored and Timeout performed Patient Re-evaluated:Patient Re-evaluated prior to induction Oxygen Delivery Method: Simple face mask Preoxygenation: Pre-oxygenation with 100% oxygen Induction Type: IV induction Placement Confirmation: positive ETCO2 Dental Injury: Teeth and Oropharynx as per pre-operative assessment

## 2022-05-05 NOTE — Anesthesia Postprocedure Evaluation (Signed)
Anesthesia Post Note  Patient: GRACLYN LAWTHER  Procedure(s) Performed: RIGHT TOTAL HIP ARTHROPLASTY ANTERIOR APPROACH (Right: Hip)     Patient location during evaluation: Nursing Unit Anesthesia Type: Spinal Level of consciousness: oriented and awake and alert Pain management: pain level controlled Vital Signs Assessment: post-procedure vital signs reviewed and stable Respiratory status: spontaneous breathing and respiratory function stable Cardiovascular status: blood pressure returned to baseline and stable Postop Assessment: no headache, no backache, no apparent nausea or vomiting and patient able to bend at knees Anesthetic complications: no   No notable events documented.  Last Vitals:  Vitals:   05/05/22 0945 05/05/22 0959  BP: (!) 132/96 (!) 127/92  Pulse: 84 75  Resp: 16 16  Temp: (!) 36.2 C (!) 36.4 C  SpO2: 100% 96%    Last Pain:  Vitals:   05/05/22 0959  TempSrc: Oral  PainSc:                  Trevor Iha

## 2022-05-05 NOTE — Anesthesia Procedure Notes (Addendum)
Spinal  Patient location during procedure: OR Start time: 05/05/2022 7:12 AM End time: 05/05/2022 7:15 AM Reason for block: surgical anesthesia Staffing Performed: anesthesiologist  Anesthesiologist: Trevor Iha, MD Performed by: Trevor Iha, MD Authorized by: Trevor Iha, MD   Preanesthetic Checklist Completed: patient identified, IV checked, risks and benefits discussed, surgical consent, monitors and equipment checked, pre-op evaluation and timeout performed Spinal Block Patient position: sitting Prep: DuraPrep and site prepped and draped Patient monitoring: heart rate, cardiac monitor, continuous pulse ox and blood pressure Approach: midline Location: L3-4 Injection technique: single-shot Needle Needle type: Pencan  Needle gauge: 24 G Needle length: 10 cm Needle insertion depth: 5 cm Assessment Sensory level: T4 Events: CSF return Additional Notes  1 Attempt (s). Pt tolerated procedure well.

## 2022-05-05 NOTE — Brief Op Note (Signed)
05/05/2022  8:18 AM  PATIENT:  Kelsey [name]  41 y.o. female  PRE-OPERATIVE DIAGNOSIS:  RIGHT HIP AVASCULAR NECROSIS  POST-OPERATIVE DIAGNOSIS:  RIGHT HIP AVASCULAR NECROSIS  PROCEDURE:  Procedure(s): RIGHT TOTAL HIP ARTHROPLASTY ANTERIOR APPROACH (Right)  SURGEON:  Surgeon(s) and Role:    Mcarthur Rossetti, MD - Primary  PHYSICIAN ASSISTANT:  Benita Stabile, PA-C  ANESTHESIA:   spinal  COUNTS:  YES  DICTATION: .Other Dictation: Dictation Number [id number]  PLAN OF CARE: Admit for overnight observation  PATIENT DISPOSITION:  PACU - hemodynamically stable.   Delay start of Pharmacological VTE agent (>24hrs) due to surgical blood loss or risk of bleeding: no

## 2022-05-05 NOTE — Transfer of Care (Signed)
Immediate Anesthesia Transfer of Care Note  Patient: Kelsey [name]  Procedure(s) Performed: RIGHT TOTAL HIP ARTHROPLASTY ANTERIOR APPROACH (Right: Hip)  Patient Location: PACU  Anesthesia Type:MAC and Spinal  Level of Consciousness: awake, alert  and patient cooperative  Airway & Oxygen Therapy: Patient Spontanous Breathing and Patient connected to face mask oxygen  Post-op Assessment: Report given to RN and Post -op Vital signs reviewed and stable  Post vital signs: Reviewed and stable  Last Vitals:  Vitals Value Taken Time  BP    Temp    Pulse    Resp    SpO2      Last Pain:  Vitals:   05/05/22 0643  PainSc: 0-No pain         Complications: No notable events documented.

## 2022-05-05 NOTE — Op Note (Signed)
NAME: Kelsey [name], CLONINGER MEDICAL RECORD NO: [id number] ACCOUNT NO: [id number] DATE OF BIRTH: [birth year] FACILITY: [facility] LOCATION: [id number] PHYSICIAN: Vanita Panda. Magnus Ivan, MD  Operative Report   DATE OF PROCEDURE: 05/05/2022   PREOPERATIVE DIAGNOSIS:  Avascular necrosis, right hip.  POSTOPERATIVE DIAGNOSIS:  Avascular necrosis, right hip.  PROCEDURE:  Right total hip arthroplasty through direct anterior approach.  IMPLANTS:  DePuy sector Gription acetabular component, size 50 with a single screw, size 32+0 neutral polyethylene liner, size 1 ACTIS femoral component with standard offset, size 32+1 ceramic hip ball.  SURGEON:  Vanita Panda. Magnus Ivan, MD  ASSISTANT:  Rexene Edison, PA-C.  ANESTHESIA:  Spinal.  ANTIBIOTICS:  1 g IV vancomycin.  ESTIMATED BLOOD LOSS:  200 mL.  COMPLICATIONS:  None.  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  In March of this year we successfully replaced her left hip through direct anterior approach.  She does have debilitating pain and well  documented AVN of her right hip and at this point given the success of her left hip replacement combined with the x-ray and MRI findings of her right hip and daily right hip pain, she does wish to proceed with a total hip arthroplasty on the right side.   Having had this before, she is fully aware of the risk of acute blood loss anemia, nerve or vessel injury, fracture, infection, dislocation, DVT, implant failure, leg length differences and skin and soft tissue issues.  She understands our goals are to  decrease pain, improve mobility and overall improve quality of life.  DESCRIPTION OF PROCEDURE:  After informed consent was obtained, appropriate right hip was marked.  She was brought to the operating room and sat up on the stretcher where spinal anesthesia was obtained.  She was laid in supine position on the stretcher.   Foley catheter was placed, I  really assessed her leg lengths closely clinically and she is actually dead on leg lengthwise even though the x-rays look like she is shorter on the right side she is on and she even feels after talking with her that she is  on leg lengthwise, so we will compensate for that during the surgery. Traction boots were placed on both her feet.  She was placed supine on the Hana fracture table.  I obtained an intraoperative spot x-ray as well, so we could again make an assessment about leg length  assessments.  Her right operative hip was prepped and draped with DuraPrep and sterile drapes.  A timeout was called.  She was identified as correct patient, correct right hip.  I then made an incision just inferior and posterior to the anterior superior  iliac spine and carried this obliquely down the leg.  We dissected down tensor fascia lata muscle.  Tensor fascia was then divided longitudinally to proceed with direct anterior approach to the hip.  We identified and cauterized circumflex vessels and  identified the hip capsule, opened the hip capsule in L-type format finding a large joint effusion.  Cobra retractors were placed around the medial and lateral femoral neck and made a femoral neck cut with an oscillating saw, proximal to the lesser  trochanter and completed this with an osteotome placed a corkscrew guide in the femoral head and removed the femoral head in its entirety and found areas of thin flaking off cartilage consistent with avascular necrosis.  We then removed remnants of  acetabular labrum and other debris and placed a  bent Hohmann over the medial acetabular rim, I then began reaming under direct visualization from a size 43 reamer in a stepwise increments going up to a size 49 reamer with all reamers placed under direct  visualization.  Last reamers placed under direct fluoroscopy, so we could obtain our depth of reaming, our inclination and anteversion.  I then placed real DePuy sector Gription  acetabular component size 50 and a 32+0 neutral polyethylene liner based off  her offset and leg lengths.  A single screw was also placed in the acetabular dome.  Attention was then turned to the femur.  With the leg externally rotated to 120 degrees and extended and adducted we were able to place a Mueller retractor medially and  Hohman retractor behind the greater trochanter.  I released lateral joint capsule and used a box cutting osteotome to enter the femoral canal and a rongeur to lateralize, we then began broaching using the ACTIS broaching system from a size 0 going to a  size 1, which correlated with her size and her thick cortical bone. With a size 1 in place, we trialed a standard offset femoral neck and a 32+1 trial hip ball.  We brought the leg back over and up and with traction and internal rotation reduced in the  pelvis.  We were pleased with leg length, offset, range of motion and stability assessed radiographically and mechanically and matching our preoperative films as well.  It was stable on exam.  We then dislocated the hip and removed the trial components.   I placed the real ACTIS femoral component with standard offset size 1 and the real 32+1 ceramic hip ball and again reduced this in acetabulum.  We appreciated the stability, assessed mechanically and radiographically.  We then irrigated the soft tissue  with normal saline solution.  The joint capsule was closed with interrupted #1 Ethibond suture followed by #1 Vicryl to close the tensor fascia.  0 Vicryl was used to close deep tissue and 2-0 Vicryl was used to close subcutaneous tissue.  The skin was  closed with staples.  An Aquacel dressing was applied.  She was taken off the Hana table and taken to recovery room in stable condition with all final counts being correct.  There were no complications noted.  Of note, Rexene Edison, PA-C, assisted during  the entire case from beginning to end.  His assistance was medically necessary for  management of soft tissues and retracting soft tissues as well as helping guide implant placement and layered closure of the wound.     [email or web address] D: 05/05/2022 8:17:12 am T: 05/05/2022 9:59:00 am  JOB: [id number]/ [id number]

## 2022-05-05 NOTE — Evaluation (Signed)
Physical Therapy Evaluation Patient Details Name: Kelsey [name] MRN: [id number] DOB: 03/02/1981 Today's Date: 05/05/2022  History of Present Illness  Pt is a 41yo female presenting s/p - R-THA anterior approach on 05/05/22. PMH: L-THA 02/17/2022, anxiety/depression, GERD, HTN, PVC, seizures, tachycardia.  Clinical Impression  Kelsey [name] is a 41 y.o. female POD 0 s/p . Patient reports modified independence using SPC for mobility at baseline, most often when she is in pain. Patient is now limited by functional impairments (see PT problem list below) and requires min guard for transfers and gait with RW. Patient was able to ambulate 10 feet with RW and min guard assist. Patient instructed in exercise to facilitate ROM and circulation to manage edema. Patient will benefit from continued skilled PT interventions to address impairments and progress towards PLOF. Acute PT will follow to progress mobility and stair training in preparation for safe discharge home.       Recommendations for follow up therapy are one component of a multi-disciplinary discharge planning process, led by the attending physician.  Recommendations may be updated based on patient status, additional functional criteria and insurance authorization.  Follow Up Recommendations Follow physician's recommendations for discharge plan and follow up therapies    Assistance Recommended at Discharge Set up Supervision/Assistance  Patient can return home with the following  A little help with walking and/or transfers;A little help with bathing/dressing/bathroom;Assistance with cooking/housework;Assist for transportation;Help with stairs or ramp for entrance    Equipment Recommendations None recommended by PT (Pt has recommended DME)  Recommendations for Other Services       Functional Status Assessment Patient has had a recent decline in their functional status and demonstrates the ability to make significant improvements in  function in a reasonable and predictable amount of time.     Precautions / Restrictions Precautions Precautions: Fall Restrictions Weight Bearing Restrictions: No Other Position/Activity Restrictions: WBAT      Mobility  Bed Mobility Overal bed mobility: Needs Assistance Bed Mobility: Supine to Sit     Supine to sit: Min assist     General bed mobility comments: Min assist for advancement of RLE off bed, otherwise min guard    Transfers Overall transfer level: Needs assistance Equipment used: Rolling walker (2 wheels) Transfers: Sit to/from Stand Sit to Stand: Min guard           General transfer comment: For safety only, no physical assist provided    Ambulation/Gait Ambulation/Gait assistance: Min guard Gait Distance (Feet): 10 Feet Assistive device: Rolling walker (2 wheels) Gait Pattern/deviations: Step-to pattern, Decreased weight shift to right Gait velocity: decreased     General Gait Details: Pt ambulated with RW and min guard assist, no physical assist provided or overt LOB noted. Demonstrated decreased weightbearing on RLE.  Stairs            Wheelchair Mobility    Modified Rankin (Stroke Patients Only)       Balance Overall balance assessment: Needs assistance Sitting-balance support: Feet supported, No upper extremity supported Sitting balance-Leahy Scale: Fair     Standing balance support: Reliant on assistive device for balance, During functional activity, Bilateral upper extremity supported Standing balance-Leahy Scale: Poor                               Pertinent Vitals/Pain Pain Assessment Pain Assessment: 0-10 Pain Score: 4  Pain Location: right hip Pain Descriptors / Indicators: Operative site guarding, Discomfort Pain Intervention(s):  Limited activity within patient's tolerance, Monitored during session, Repositioned, Ice applied    Home Living Family/patient expects to be discharged to:: Private  residence Living Arrangements: Spouse/significant other Available Help at Discharge: Family;Available 24 hours/day Type of Home: House Home Access: Stairs to enter Entrance Stairs-Rails: None Entrance Stairs-Number of Steps: 1   Home Layout: One level Home Equipment: Grab bars - tub/shower;Shower seat;Toilet riser;Cane - single point;Rolling Walker (2 wheels)      Prior Function Prior Level of Function : Independent/Modified Independent             Mobility Comments: SPC especially for longer distance especially this week, felt something pop ADLs Comments: IND     Hand Dominance   Dominant Hand: Right    Extremity/Trunk Assessment   Upper Extremity Assessment Upper Extremity Assessment: Overall WFL for tasks assessed    Lower Extremity Assessment Lower Extremity Assessment: RLE deficits/detail;LLE deficits/detail RLE Deficits / Details: MMT ank DF/PF 5/5 RLE Sensation: WNL LLE Deficits / Details: MMT ank DF/PF 5/5 LLE Sensation: WNL    Cervical / Trunk Assessment Cervical / Trunk Assessment: Normal  Communication   Communication: No difficulties  Cognition Arousal/Alertness: Awake/alert Behavior During Therapy: WFL for tasks assessed/performed Overall Cognitive Status: Within Functional Limits for tasks assessed                                          General Comments      Exercises Total Joint Exercises Ankle Circles/Pumps: AROM, Both, 20 reps, Seated Other Exercises Other Exercises: Incentive spirometry x3, pt reached 1772mL   Assessment/Plan    PT Assessment Patient needs continued PT services  PT Problem List Decreased strength;Decreased range of motion;Decreased activity tolerance;Decreased balance;Decreased mobility;Decreased coordination;Pain       PT Treatment Interventions DME instruction;Gait training;Stair training;Functional mobility training;Therapeutic activities;Therapeutic exercise;Balance training;Neuromuscular  re-education;Patient/family education    PT Goals (Current goals can be found in the Care Plan section)  Acute Rehab PT Goals Patient Stated Goal: to walk without pain PT Goal Formulation: With patient Time For Goal Achievement: 05/12/22 Potential to Achieve Goals: Good    Frequency 7X/week     Co-evaluation               AM-PAC PT "6 Clicks" Mobility  Outcome Measure Help needed turning from your back to your side while in a flat bed without using bedrails?: None Help needed moving from lying on your back to sitting on the side of a flat bed without using bedrails?: None Help needed moving to and from a bed to a chair (including a wheelchair)?: A Little Help needed standing up from a chair using your arms (e.g., wheelchair or bedside chair)?: A Little Help needed to walk in hospital room?: A Little Help needed climbing 3-5 steps with a railing? : A Little 6 Click Score: 20    End of Session Equipment Utilized During Treatment: Gait belt Activity Tolerance: Patient tolerated treatment well;No increased pain Patient left: in chair;with call bell/phone within reach;with chair alarm set;with SCD's reapplied Nurse Communication: Mobility status PT Visit Diagnosis: Pain;Difficulty in walking, not elsewhere classified (R26.2) Pain - Right/Left: Right Pain - part of body: Hip    Time: [id number] PT Time Calculation (min) (ACUTE ONLY): 21 min   Charges:   PT Evaluation $PT Eval Low Complexity: 1 Low          Coolidge Breeze, PT, DPT  [facility] Office: [phone] Pager: [phone]  Coolidge Breeze 05/05/2022, 3:54 PM

## 2022-05-05 NOTE — Interval H&P Note (Signed)
History and Physical Interval Note: The patient is a 41 year old female well-known to [place].  She understands that she is here today for a right total hip replacement to treat her right hip avascular necrosis. There has been no acute or interval change in her medical status.  Please see H&P.  The risks and benefits of surgery been explained in detail and informed consent is obtained.  The right operative hip has been marked.  05/05/2022 6:59 AM  Kelsey [name]  has presented today for surgery, with the diagnosis of RIGHT HIP AVASCULAR NECROSIS.  The various methods of treatment have been discussed with the patient and family. After consideration of risks, benefits and other options for treatment, the patient has consented to  Procedure(s): RIGHT TOTAL HIP ARTHROPLASTY ANTERIOR APPROACH (Right) as a surgical intervention.  The patient's history has been reviewed, patient examined, no change in status, stable for surgery.  I have reviewed the patient's chart and labs.  Questions were answered to the patient's satisfaction.     Kathryne Hitch

## 2022-05-06 DIAGNOSIS — M87051 Idiopathic aseptic necrosis of right femur: Secondary | ICD-10-CM | POA: Diagnosis not present

## 2022-05-06 LAB — BASIC METABOLIC PANEL
Anion gap: 5 (ref 5–15)
BUN: 8 mg/dL (ref 6–20)
CO2: 32 mmol/L (ref 22–32)
Calcium: 7.9 mg/dL — ABNORMAL LOW (ref 8.9–10.3)
Chloride: 104 mmol/L (ref 98–111)
Creatinine, Ser: 0.59 mg/dL (ref 0.44–1.00)
GFR, Estimated: >60 mL/min (ref >60)
Glucose, Bld: 104 mg/dL — ABNORMAL HIGH (ref 70–99)
Potassium: 2.9 mmol/L — ABNORMAL LOW (ref 3.5–5.1)
Sodium: 141 mmol/L (ref 135–145)

## 2022-05-06 LAB — CBC
HCT: 31.1 % — ABNORMAL LOW (ref 36.0–46.0)
Hemoglobin: 9.6 g/dL — ABNORMAL LOW (ref 12.0–15.0)
MCH: 30.9 pg (ref 26.0–34.0)
MCHC: 30.9 g/dL (ref 30.0–36.0)
MCV: 100 fL (ref 80.0–100.0)
Platelets: 246 10*3/uL (ref 150–400)
RBC: 3.11 MIL/uL — ABNORMAL LOW (ref 3.87–5.11)
RDW: 14.1 % (ref 11.5–15.5)
WBC: 10.6 10*3/uL — ABNORMAL HIGH (ref 4.0–10.5)
nRBC: 0 % (ref 0.0–0.2)

## 2022-05-06 MED ORDER — HYDROCODONE-ACETAMINOPHEN 7.5-325 MG PO TABS: 1.0000 | ORAL_TABLET | Freq: Four times a day (QID) | ORAL | 0 refills | Status: DC | PRN

## 2022-05-06 NOTE — Progress Notes (Signed)
Provided discharge education/instructions, all questions and concerns addressed. Pt not in acute distress, discharged home with belongings accompanied by family. 

## 2022-05-06 NOTE — Discharge Instructions (Signed)

## 2022-05-06 NOTE — Progress Notes (Signed)
Subjective: 1 Day Post-Op Procedure(s) (LRB): RIGHT TOTAL HIP ARTHROPLASTY ANTERIOR APPROACH (Right) Patient reports pain as moderate.    Objective: Vital signs in last 24 hours: Temp:  [97.5 F (36.4 C)-98.7 F (37.1 C)] 97.6 F (36.4 C) (06/10 0815) Pulse Rate:  [75-103] 103 (06/10 0815) Resp:  [15-18] 15 (06/10 0815) BP: (103-131)/(70-114) 103/70 (06/10 0815) SpO2:  [90 %-100 %] 97 % (06/10 0815) Weight:  [68 kg] 68 kg (06/09 1000)  Intake/Output from previous day: 06/09 0701 - 06/10 0700 In: 2825.5 [P.O.:720; I.V.:1575.5; IV Piggyback:530] Out: 2100 [Urine:1900; Blood:200] Intake/Output this shift: Total I/O In: 1099 [P.O.:240; I.V.:859] Out: -   Recent Labs    05/06/22 0353  HGB 9.6*   Recent Labs    05/06/22 0353  WBC 10.6*  RBC 3.11*  HCT 31.1*  PLT 246   Recent Labs    05/06/22 0353  NA 141  K 2.9*  CL 104  CO2 32  BUN 8  CREATININE 0.59  GLUCOSE 104*  CALCIUM 7.9*   No results for input(s): "LABPT", "INR" in the last 72 hours.  Sensation intact distally Intact pulses distally Dorsiflexion/Plantar flexion intact Incision: scant drainage   Assessment/Plan: 1 Day Post-Op Procedure(s) (LRB): RIGHT TOTAL HIP ARTHROPLASTY ANTERIOR APPROACH (Right) Up with therapy Discharge home with home health      Kathryne Hitch 05/06/2022, 9:56 AM

## 2022-05-06 NOTE — Discharge Summary (Signed)
Patient ID: Kelsey [name] MRN: [id number] DOB/AGE: 1981/10/14 41 y.o.  Admit date: 05/05/2022 Discharge date: 05/06/2022  Admission Diagnoses:  Principal Problem:   Avascular necrosis of bone of right hip (HCC) Active Problems:   Status post total replacement of right hip   Discharge Diagnoses:  Same  Past Medical History:  Diagnosis Date   Anxiety    Asthma    hosp. 2018 and 2019 no h/o intubation    Depression    GERD (gastroesophageal reflux disease)    Hypertension    Palpitations    Perianal abscess 11/22/2016   12/07/2016.  [facility], [place].  Kelsey Kin, MD.  Perianal abscess is much better but still needs local wound care.  Continue sitz baths 2-3 times perday until all drainage and pain are gone.  Follow up as needed.   Preop general physical exam 01/26/2022   PVC (premature ventricular contraction)    Seasonal allergies    Seizures (HCC)    01/15/22 was last seizure   Tachycardia     Surgeries: Procedure(s): RIGHT TOTAL HIP ARTHROPLASTY ANTERIOR APPROACH on 05/05/2022   Consultants:   Discharged Condition: Improved  Hospital Course: Kelsey [name] is an 41 y.o. female who was admitted 05/05/2022 for operative treatment ofAvascular necrosis of bone of right hip (HCC). Patient has severe unremitting pain that affects sleep, daily activities, and work/hobbies. After pre-op clearance the patient was taken to the operating room on 05/05/2022 and underwent  Procedure(s): RIGHT TOTAL HIP ARTHROPLASTY ANTERIOR APPROACH.    Patient was given perioperative antibiotics:  Anti-infectives (From admission, onward)    Start     Dose/Rate Route Frequency Ordered Stop   05/05/22 1830  vancomycin (VANCOCIN) IVPB 1000 mg/200 mL premix        1,000 mg 200 mL/hr over 60 Minutes Intravenous Every 12 hours 05/05/22 0958 05/05/22 1927   05/05/22 1800  vancomycin (VANCOCIN) IVPB 1000 mg/200 mL premix  Status:  Discontinued        1,000 mg 200 mL/hr over 60 Minutes  Intravenous  Once 05/05/22 1408 05/05/22 1414   05/05/22 0600  vancomycin (VANCOCIN) IVPB 1000 mg/200 mL premix        1,000 mg 200 mL/hr over 60 Minutes Intravenous On call to O.R. 05/05/22 0559 05/05/22 0746        Patient was given sequential compression devices, early ambulation, and chemoprophylaxis to prevent DVT.  Patient benefited maximally from hospital stay and there were no complications.    Recent vital signs: Patient Vitals for the past 24 hrs:  BP Temp Temp src Pulse Resp SpO2 Height Weight  05/06/22 0815 103/70 97.6 F (36.4 C) Oral (!) 103 15 97 % -- --  05/06/22 0448 -- -- -- 98 -- 99 % -- --  05/06/22 0445 109/78 97.6 F (36.4 C) Oral 94 17 100 % -- --  05/06/22 0009 106/83 98 F (36.7 C) -- 97 18 90 % -- --  05/05/22 2027 (!) 129/114 98.7 F (37.1 C) -- 95 18 91 % -- --  05/05/22 1724 113/83 97.7 F (36.5 C) -- 88 18 95 % -- --  05/05/22 1337 109/76 98.3 F (36.8 C) -- 87 16 94 % -- --  05/05/22 1206 131/89 98.1 F (36.7 C) -- 96 18 94 % -- --  05/05/22 1000 -- -- -- -- -- -- 5\' 4"  (1.626 m) 68 kg  05/05/22 0959 (!) 127/92 (!) 97.5 F (36.4 C) Oral 75 16 96 % -- --  Recent laboratory studies:  Recent Labs    05/06/22 0353  WBC 10.6*  HGB 9.6*  HCT 31.1*  PLT 246  NA 141  K 2.9*  CL 104  CO2 32  BUN 8  CREATININE 0.59  GLUCOSE 104*  CALCIUM 7.9*     Discharge Medications:   Allergies as of 05/06/2022       Reactions   Penicillins Rash   Has patient had a PCN reaction causing immediate rash, facial/tongue/throat swelling, SOB or lightheadedness with hypotension: No Has patient had a PCN reaction causing severe rash involving mucus membranes or skin necrosis: No Has patient had a PCN reaction that required hospitalization No Has patient had a PCN reaction occurring within the last 10 years: No If all of the above answers are "NO", then may proceed with Cephalosporin use.        Medication List     STOP taking these medications     HYDROcodone-acetaminophen 5-325 MG tablet Commonly known as: NORCO/VICODIN Replaced by: HYDROcodone-acetaminophen 7.5-325 MG tablet       TAKE these medications    albuterol 108 (90 Base) MCG/ACT inhaler Commonly known as: VENTOLIN HFA Inhale 1-2 puffs into the lungs every 6 (six) hours as needed for wheezing or shortness of breath.   ALPRAZolam 1 MG tablet Commonly known as: XANAX Take 1 tablet (1 mg total) by mouth at bedtime as needed for anxiety. What changed: when to take this   aspirin 81 MG chewable tablet Chew 1 tablet (81 mg total) by mouth 2 (two) times daily. What changed: Another medication with the same name was added. Make sure you understand how and when to take each.   aspirin 81 MG chewable tablet Chew 1 tablet (81 mg total) by mouth 2 (two) times daily. What changed: You were already taking a medication with the same name, and this prescription was added. Make sure you understand how and when to take each.   cyclobenzaprine 10 MG tablet Commonly known as: FLEXERIL Take 1 tablet (10 mg total) by mouth 3 (three) times daily as needed for muscle spasms.   diphenhydrAMINE 25 MG tablet Commonly known as: BENADRYL Take 25 mg by mouth every 6 (six) hours as needed for allergies.   FLUoxetine 20 MG capsule Commonly known as: PROZAC Take 80 mg by mouth daily.   gabapentin 300 MG capsule Commonly known as: NEURONTIN TAKE 2 CAPSULES (600 MG TOTAL) BY MOUTH 3 (THREE) TIMES DAILY. What changed:  how much to take when to take this   hydrALAZINE 25 MG tablet Commonly known as: APRESOLINE TAKE 1 TABLET BY MOUTH THREE TIMES A DAY   HYDROcodone-acetaminophen 7.5-325 MG tablet Commonly known as: Norco Take 1-2 tablets by mouth every 6 (six) hours as needed for moderate pain. Replaces: HYDROcodone-acetaminophen 5-325 MG tablet   levETIRAcetam 500 MG tablet Commonly known as: Keppra Take 1 tablet (500 mg total) by mouth 2 (two) times daily.   levonorgestrel  20 MCG/24HR IUD Commonly known as: MIRENA 1 Intra Uterine Device (1 each total) by Intrauterine route once.   telmisartan-hydrochlorothiazide 40-12.5 MG tablet Commonly known as: MICARDIS HCT TAKE 1 TABLET BY MOUTH EVERY DAY               Durable Medical Equipment  (From admission, onward)           Start     Ordered   05/05/22 0959  DME 3 n 1  Once        05/05/22 2841  05/05/22 0959  DME Walker rolling  Once       Question Answer Comment  Walker: With 5 Inch Wheels   Patient needs a walker to treat with the following condition Status post total replacement of right hip      05/05/22 0958            Diagnostic Studies: DG Pelvis Portable  Result Date: 05/05/2022 CLINICAL DATA:  Status post right hip replacement EXAM: PORTABLE PELVIS 1-2 VIEWS COMPARISON:  None Available. FINDINGS: Status post right hip total arthroplasty with expected overlying postoperative change. No evidence of perihardware fracture or component malpositioning. Nonobstructive pattern of overlying bowel gas. IUD projects in the pelvis. IMPRESSION: Status post right hip total arthroplasty with expected overlying postoperative change. No evidence of perihardware fracture or component malpositioning. Electronically Signed   By: Jearld LeschAlex D Bibbey M.D.   On: 05/05/2022 09:15   DG HIP UNILAT WITH PELVIS 1V RIGHT  Result Date: 05/05/2022 CLINICAL DATA:  Intraoperative fluoroscopy right anterior hip replacement. EXAM: DG HIP (WITH OR WITHOUT PELVIS) 1V RIGHT COMPARISON:  Radiograph June 30, 2021 FLUOROSCOPY: Exposure Index (as provided by the fluoroscopic device): 1.5654 mGy Kerma FINDINGS: Intraoperative fluoroscopic images demonstrating changes of right total hip arthroplasty without evidence of acute complication. Prior left total hip arthroplasty. Intrauterine device. IMPRESSION: Intraoperative fluoroscopy for right total hip arthroplasty. Electronically Signed   By: Maudry MayhewJeffrey  Waltz M.D.   On: 05/05/2022 08:43    DG C-Arm 1-60 Min-No Report  Result Date: 05/05/2022 Fluoroscopy was utilized by the requesting physician.  No radiographic interpretation.    Disposition: Discharge disposition: 01-Home or Self Care          Follow-up Information     Kathryne HitchBlackman, Tamerra Merkley Y, MD Follow up in 2 week(s).   Specialty: Orthopedic Surgery Contact information: [address] [place] [address] [phone]                  Signed: Kathryne HitchChristopher Y Eymi Lipuma 05/06/2022, 9:57 AM

## 2022-05-06 NOTE — Plan of Care (Signed)

## 2022-05-06 NOTE — Progress Notes (Signed)
Physical Therapy Treatment Patient Details Name: Kelsey [name] MRN: [id number] DOB: November 08, 1981 Today's Date: 05/06/2022   History of Present Illness Pt is a 41 yo female presenting s/p - Right THA anterior approach. PMH: L-THA 02/17/2022, anxiety/depression, GERD, HTN, PVC, seizures, tachycardia.    PT Comments    Pt ambulated in hallway and practiced one step. Pt with mild UE tremoring which she reports not unusual.  Pt requiring increased time for mobility.  Pt eager to d/c home so verbally reviewed HEP and provided handout (pt had L THA earlier this year).  Pt had no further questions and feels ready for d/c home today.     Recommendations for follow up therapy are one component of a multi-disciplinary discharge planning process, led by the attending physician.  Recommendations may be updated based on patient status, additional functional criteria and insurance authorization.  Follow Up Recommendations  Follow physician's recommendations for discharge plan and follow up therapies     Assistance Recommended at Discharge Set up Supervision/Assistance  Patient can return home with the following A little help with walking and/or transfers;A little help with bathing/dressing/bathroom;Assistance with cooking/housework;Assist for transportation;Help with stairs or ramp for entrance   Equipment Recommendations  None recommended by PT    Recommendations for Other Services       Precautions / Restrictions Precautions Precautions: Fall Restrictions Weight Bearing Restrictions: Yes RLE Weight Bearing: Weight bearing as tolerated     Mobility  Bed Mobility Overal bed mobility: Needs Assistance Bed Mobility: Supine to Sit     Supine to sit: Supervision, HOB elevated          Transfers Overall transfer level: Needs assistance Equipment used: Rolling walker (2 wheels) Transfers: Sit to/from Stand Sit to Stand: Supervision, Min guard                 Ambulation/Gait Ambulation/Gait assistance: Min guard Gait Distance (Feet): 200 Feet Assistive device: Rolling walker (2 wheels) Gait Pattern/deviations: Step-to pattern Gait velocity: decreased     General Gait Details: verbal cues for sequence and posture as pt tends to lean trunk to right, reliant on UE support for pain control, increased time   Stairs Stairs: Yes Stairs assistance: Supervision, Min guard Stair Management: Step to pattern, With walker, Forwards Number of Stairs: 1 General stair comments: verbal cues for safety and sequence; pt reports understanding   Wheelchair Mobility    Modified Rankin (Stroke Patients Only)       Balance                                            Cognition Arousal/Alertness: Awake/alert Behavior During Therapy: WFL for tasks assessed/performed Overall Cognitive Status: Within Functional Limits for tasks assessed                                          Exercises Total Joint Exercises Ankle Circles/Pumps: AROM, Both, 10 reps Quad Sets: AROM, Right, 5 reps    General Comments        Pertinent Vitals/Pain Pain Assessment Pain Assessment: 0-10 Pain Score: 7  Pain Location: right hip Pain Descriptors / Indicators: Burning Pain Intervention(s): Monitored during session, Repositioned, Patient requesting pain meds-RN notified    Home Living  Prior Function            PT Goals (current goals can now be found in the care plan section) Progress towards PT goals: Progressing toward goals    Frequency    7X/week      PT Plan Current plan remains appropriate    Co-evaluation              AM-PAC PT "6 Clicks" Mobility   Outcome Measure  Help needed turning from your back to your side while in a flat bed without using bedrails?: None Help needed moving from lying on your back to sitting on the side of a flat bed without using  bedrails?: None Help needed moving to and from a bed to a chair (including a wheelchair)?: A Little Help needed standing up from a chair using your arms (e.g., wheelchair or bedside chair)?: A Little Help needed to walk in hospital room?: A Little Help needed climbing 3-5 steps with a railing? : A Little 6 Click Score: 20    End of Session Equipment Utilized During Treatment: Gait belt Activity Tolerance: Patient tolerated treatment well Patient left: with call bell/phone within reach;in chair;with family/visitor present Nurse Communication: Mobility status PT Visit Diagnosis: Pain;Difficulty in walking, not elsewhere classified (R26.2) Pain - Right/Left: Right Pain - part of body: Hip     Time: 1030-1058 PT Time Calculation (min) (ACUTE ONLY): 28 min  Charges:  $Gait Training: 23-37 mins                    Thomasene Mohair PT, DPT Acute Rehabilitation Services Pager: [phone] Office: [phone]    Kelsey [name] 05/06/2022, 11:42 AM

## 2022-05-08 ENCOUNTER — Telehealth: Payer: Self-pay

## 2022-05-08 ENCOUNTER — Encounter (HOSPITAL_COMMUNITY): Payer: Self-pay | Admitting: Orthopaedic Surgery

## 2022-05-08 NOTE — Telephone Encounter (Signed)
Transition Care Management Unsuccessful Follow-up Telephone Call  Date of discharge and from where:  05/06/22 Kelsey [name]  Attempts:  1st Attempt  Reason for unsuccessful TCM follow-up call:  No answer/busy

## 2022-05-15 ENCOUNTER — Other Ambulatory Visit: Payer: Self-pay | Admitting: Internal Medicine

## 2022-05-15 DIAGNOSIS — I10 Essential (primary) hypertension: Secondary | ICD-10-CM

## 2022-05-18 ENCOUNTER — Ambulatory Visit (INDEPENDENT_AMBULATORY_CARE_PROVIDER_SITE_OTHER): Payer: Commercial Managed Care - PPO | Admitting: Orthopaedic Surgery

## 2022-05-18 ENCOUNTER — Encounter: Payer: Self-pay | Admitting: Orthopaedic Surgery

## 2022-05-18 DIAGNOSIS — Z96642 Presence of left artificial hip joint: Secondary | ICD-10-CM

## 2022-05-18 DIAGNOSIS — Z96641 Presence of right artificial hip joint: Secondary | ICD-10-CM

## 2022-05-18 MED ORDER — HYDROCODONE-ACETAMINOPHEN 7.5-325 MG PO TABS: 1.0000 | ORAL_TABLET | Freq: Four times a day (QID) | ORAL | 0 refills | Status: DC | PRN

## 2022-05-18 NOTE — Progress Notes (Signed)
The patient is 2 weeks status post a right total hip arthroplasty.  We actually replaced her left hip back in March of this year.  Both of this was due to avascular necrosis.  She does work as a Buyer, retail.  She is doing well overall.  She has been compliant with her aspirin and taking hydrocodone as needed.  She reports good range of motion and strength.  She does not walk with any assistive device.  Her right hip incision looks good.  The staples been removed and Steri-Strips applied.  Her leg lengths appear equal.  We will see her back in 4 weeks to see how she is doing overall but no x-rays are needed.  I did refill her oxycodone which is appropriate.  She can stop her aspirin given her high level mobility and no ankle and foot swelling.

## 2022-06-05 ENCOUNTER — Encounter (HOSPITAL_COMMUNITY): Payer: Self-pay

## 2022-06-05 ENCOUNTER — Emergency Department (HOSPITAL_COMMUNITY): Payer: Commercial Managed Care - PPO

## 2022-06-05 ENCOUNTER — Other Ambulatory Visit: Payer: Self-pay

## 2022-06-05 ENCOUNTER — Emergency Department (HOSPITAL_COMMUNITY)
Admission: EM | Admit: 2022-06-05 | Discharge: 2022-06-06 | Disposition: A | Discharge status: Home / Self Care | Payer: Commercial Managed Care - PPO | Attending: Student | Admitting: Student

## 2022-06-05 DIAGNOSIS — R000 Tachycardia, unspecified: Secondary | ICD-10-CM | POA: Insufficient documentation

## 2022-06-05 DIAGNOSIS — F1090 Alcohol use, unspecified, uncomplicated: Secondary | ICD-10-CM | POA: Diagnosis not present

## 2022-06-05 DIAGNOSIS — F17210 Nicotine dependence, cigarettes, uncomplicated: Secondary | ICD-10-CM | POA: Insufficient documentation

## 2022-06-05 DIAGNOSIS — I10 Essential (primary) hypertension: Secondary | ICD-10-CM | POA: Insufficient documentation

## 2022-06-05 DIAGNOSIS — F1290 Cannabis use, unspecified, uncomplicated: Secondary | ICD-10-CM | POA: Diagnosis not present

## 2022-06-05 DIAGNOSIS — Z7982 Long term (current) use of aspirin: Secondary | ICD-10-CM | POA: Diagnosis not present

## 2022-06-05 DIAGNOSIS — F322 Major depressive disorder, single episode, severe without psychotic features: Secondary | ICD-10-CM | POA: Diagnosis present

## 2022-06-05 DIAGNOSIS — J45909 Unspecified asthma, uncomplicated: Secondary | ICD-10-CM | POA: Diagnosis not present

## 2022-06-05 DIAGNOSIS — Z79899 Other long term (current) drug therapy: Secondary | ICD-10-CM | POA: Diagnosis not present

## 2022-06-05 DIAGNOSIS — R569 Unspecified convulsions: Secondary | ICD-10-CM | POA: Insufficient documentation

## 2022-06-05 DIAGNOSIS — T1491XA Suicide attempt, initial encounter: Secondary | ICD-10-CM | POA: Diagnosis not present

## 2022-06-05 DIAGNOSIS — X838XXA Intentional self-harm by other specified means, initial encounter: Secondary | ICD-10-CM | POA: Insufficient documentation

## 2022-06-05 LAB — RAPID URINE DRUG SCREEN, HOSP PERFORMED
Amphetamines: NOT DETECTED
Barbiturates: NOT DETECTED
Benzodiazepines: POSITIVE — AB
Cocaine: NOT DETECTED
Opiates: POSITIVE — AB
Tetrahydrocannabinol: POSITIVE — AB

## 2022-06-05 LAB — URINALYSIS, ROUTINE W REFLEX MICROSCOPIC
Bilirubin Urine: NEGATIVE
Glucose, UA: NEGATIVE mg/dL
Hgb urine dipstick: NEGATIVE
Ketones, ur: 20 mg/dL — AB
Leukocytes,Ua: NEGATIVE
Nitrite: NEGATIVE
Protein, ur: NEGATIVE mg/dL
Specific Gravity, Urine: 1.008 (ref 1.005–1.030)
pH: 7 (ref 5.0–8.0)

## 2022-06-05 LAB — ETHANOL: Alcohol, Ethyl (B): <10 mg/dL (ref <10)

## 2022-06-05 LAB — SALICYLATE LEVEL: Salicylate Lvl: <7 mg/dL — ABNORMAL LOW (ref 7.0–30.0)

## 2022-06-05 LAB — CBC WITH DIFFERENTIAL/PLATELET
Abs Immature Granulocytes: 0.03 10*3/uL (ref 0.00–0.07)
Basophils Absolute: 0 10*3/uL (ref 0.0–0.1)
Basophils Relative: 0 %
Eosinophils Absolute: 0 10*3/uL (ref 0.0–0.5)
Eosinophils Relative: 0 %
HCT: 45.3 % (ref 36.0–46.0)
Hemoglobin: 14.6 g/dL (ref 12.0–15.0)
Immature Granulocytes: 0 %
Lymphocytes Relative: 15 %
Lymphs Abs: 1.4 10*3/uL (ref 0.7–4.0)
MCH: 31.6 pg (ref 26.0–34.0)
MCHC: 32.2 g/dL (ref 30.0–36.0)
MCV: 98.1 fL (ref 80.0–100.0)
Monocytes Absolute: 0.5 10*3/uL (ref 0.1–1.0)
Monocytes Relative: 5 %
Neutro Abs: 7.4 10*3/uL (ref 1.7–7.7)
Neutrophils Relative %: 80 %
Platelets: 331 10*3/uL (ref 150–400)
RBC: 4.62 MIL/uL (ref 3.87–5.11)
RDW: 18.5 % — ABNORMAL HIGH (ref 11.5–15.5)
WBC: 9.3 10*3/uL (ref 4.0–10.5)
nRBC: 0 % (ref 0.0–0.2)

## 2022-06-05 LAB — COMPREHENSIVE METABOLIC PANEL
ALT: 55 U/L — ABNORMAL HIGH (ref 0–44)
AST: 84 U/L — ABNORMAL HIGH (ref 15–41)
Albumin: 3.8 g/dL (ref 3.5–5.0)
Alkaline Phosphatase: 121 U/L (ref 38–126)
Anion gap: 11 (ref 5–15)
BUN: 8 mg/dL (ref 6–20)
CO2: 24 mmol/L (ref 22–32)
Calcium: 8.6 mg/dL — ABNORMAL LOW (ref 8.9–10.3)
Chloride: 105 mmol/L (ref 98–111)
Creatinine, Ser: 1.23 mg/dL — ABNORMAL HIGH (ref 0.44–1.00)
GFR, Estimated: 57 mL/min — ABNORMAL LOW (ref >60)
Glucose, Bld: 136 mg/dL — ABNORMAL HIGH (ref 70–99)
Potassium: 3.4 mmol/L — ABNORMAL LOW (ref 3.5–5.1)
Sodium: 140 mmol/L (ref 135–145)
Total Bilirubin: 0.7 mg/dL (ref 0.3–1.2)
Total Protein: 8 g/dL (ref 6.5–8.1)

## 2022-06-05 LAB — ACETAMINOPHEN LEVEL: Acetaminophen (Tylenol), Serum: <10 ug/mL — ABNORMAL LOW (ref 10–30)

## 2022-06-05 LAB — TSH: TSH: 0.784 u[IU]/mL (ref 0.350–4.500)

## 2022-06-05 MED ORDER — STERILE WATER FOR INJECTION IJ SOLN
INTRAMUSCULAR | Status: AC
  Filled 2022-06-05: qty 10

## 2022-06-05 MED ORDER — HYDROCHLOROTHIAZIDE 12.5 MG PO TABS
12.5000 mg | ORAL_TABLET | Freq: Every day | ORAL | Status: DC
  Administered 2022-06-05: 12.5 mg via ORAL
  Filled 2022-06-05: qty 1

## 2022-06-05 MED ORDER — ASPIRIN 81 MG PO CHEW
81.0000 mg | CHEWABLE_TABLET | Freq: Two times a day (BID) | ORAL | Status: DC
  Administered 2022-06-05 – 2022-06-06 (×2): 81 mg via ORAL
  Filled 2022-06-05 (×2): qty 1

## 2022-06-05 MED ORDER — ONDANSETRON HCL 4 MG/2ML IJ SOLN
8.0000 mg | Freq: Once | INTRAMUSCULAR | Status: AC
  Administered 2022-06-05: 8 mg via INTRAVENOUS
  Filled 2022-06-05: qty 4

## 2022-06-05 MED ORDER — LORAZEPAM 1 MG PO TABS
1.0000 mg | ORAL_TABLET | Freq: Once | ORAL | Status: AC
  Administered 2022-06-05: 1 mg via ORAL
  Filled 2022-06-05: qty 1

## 2022-06-05 MED ORDER — ALPRAZOLAM 0.5 MG PO TABS
1.0000 mg | ORAL_TABLET | Freq: Three times a day (TID) | ORAL | Status: DC | PRN
Start: 2022-06-05 — End: 2022-06-06
  Administered 2022-06-05 – 2022-06-06 (×2): 1 mg via ORAL
  Filled 2022-06-05 (×2): qty 2

## 2022-06-05 MED ORDER — LACTATED RINGERS IV BOLUS
1000.0000 mL | Freq: Once | INTRAVENOUS | Status: AC
  Administered 2022-06-05: 1000 mL via INTRAVENOUS

## 2022-06-05 MED ORDER — LORAZEPAM 2 MG/ML IJ SOLN
2.0000 mg | Freq: Once | INTRAMUSCULAR | Status: AC | PRN
Start: 2022-06-05 — End: 2022-06-05
  Administered 2022-06-05: 2 mg via INTRAVENOUS
  Filled 2022-06-05: qty 1

## 2022-06-05 MED ORDER — SODIUM CHLORIDE 0.9 % IV SOLN: 2000.0000 mg | Freq: Once | INTRAVENOUS | Status: DC

## 2022-06-05 MED ORDER — LEVETIRACETAM 500 MG PO TABS
500.0000 mg | ORAL_TABLET | Freq: Two times a day (BID) | ORAL | Status: DC
  Administered 2022-06-05 – 2022-06-06 (×2): 500 mg via ORAL
  Filled 2022-06-05 (×2): qty 1

## 2022-06-05 MED ORDER — IRBESARTAN 150 MG PO TABS
150.0000 mg | ORAL_TABLET | Freq: Every day | ORAL | Status: DC
  Administered 2022-06-05: 150 mg via ORAL
  Filled 2022-06-05: qty 1

## 2022-06-05 MED ORDER — HYDRALAZINE HCL 25 MG PO TABS
25.0000 mg | ORAL_TABLET | Freq: Three times a day (TID) | ORAL | Status: DC
  Administered 2022-06-05: 25 mg via ORAL
  Filled 2022-06-05: qty 1

## 2022-06-05 MED ORDER — TELMISARTAN-HCTZ 40-12.5 MG PO TABS
1.0000 | ORAL_TABLET | Freq: Every day | ORAL | Status: DC
Start: 2022-06-05 — End: 2022-06-05

## 2022-06-05 MED ORDER — LEVETIRACETAM IN NACL 1000 MG/100ML IV SOLN
1000.0000 mg | INTRAVENOUS | Status: AC
  Administered 2022-06-05 (×2): 1000 mg via INTRAVENOUS
  Filled 2022-06-05: qty 100

## 2022-06-05 MED ORDER — ZIPRASIDONE MESYLATE 20 MG IM SOLR
20.0000 mg | Freq: Once | INTRAMUSCULAR | Status: DC
  Filled 2022-06-05: qty 20

## 2022-06-05 MED ORDER — ALBUTEROL SULFATE (2.5 MG/3ML) 0.083% IN NEBU
2.5000 mg | INHALATION_SOLUTION | Freq: Once | RESPIRATORY_TRACT | Status: AC
  Administered 2022-06-05: 2.5 mg via RESPIRATORY_TRACT

## 2022-06-05 NOTE — ED Notes (Signed)
Pt husband states she got her xanax filled Friday and it was 90 tablets and when he got home tonight she only had 60 in the bottle.

## 2022-06-05 NOTE — ED Provider Notes (Signed)
[facility] Provider Note  CSN: [id number] Arrival date & time: 06/05/22 1539  Chief Complaint(s) Seizures and Suicide Attempt  HPI Kelsey [name] is a 41 y.o. female with PMH alcohol abuse, seizures on Keppra, HTN, postop right total hip replacement on 05/05/2022 who presents emergency department for evaluation of altered mental status and a suicide attempt.  Patient is quite somnolent on arrival and history is difficult to obtain but she states that she has been drinking alcohol and taking "pain pills" that include opioids and home Xanax in an attempt to kill herself.  She states that she took at least 5-6 hydrocodone pills and an unknown amount of Xanax sometime this morning.  She is unsure of exact time of ingestion.  Additional history obtained from patient's husband who has not been with her today as he has been at work but states that she has been drinking and taking Xanax all week long.  Additional history unable to be obtained given patient's current mental status.  Of note, patient has a previous admission for status epilepticus and has an intermittent history of noncompliance with her Keppra and is unsure if she had a seizure today.   Past Medical History Past Medical History:  Diagnosis Date   Anxiety    Asthma    hosp. 2018 and 2019 no h/o intubation    Depression    GERD (gastroesophageal reflux disease)    Hypertension    Palpitations    Perianal abscess 11/22/2016   12/07/2016.  [facility], [place].  [name], MD.  Perianal abscess is much better but still needs local wound care.  Continue sitz baths 2-3 times perday until all drainage and pain are gone.  Follow up as needed.   Preop general physical exam 01/26/2022   PVC (premature ventricular contraction)    Seasonal allergies    Seizures ([place])    01/15/22 was last seizure   Tachycardia    Patient Active Problem List   Diagnosis Date Noted   Status post total replacement of right hip  05/05/2022   Hospital discharge follow-up 02/23/2022   Status post left hip replacement 02/17/2022   Transaminitis 01/26/2022   Macrocytic anemia 01/26/2022   Thrombocytopenia ([place]) 01/18/2022   Avascular necrosis of bone of left hip ([place]) 12/20/2021   Avascular necrosis of bone of right hip ([place]) 12/20/2021   Lumbar spondylosis 10/05/2021   Positive double stranded DNA antibody test 09/07/2021   Mixed hyperlipidemia 08/16/2021   Seizure disorder ([place]) 07/28/2021   Chronic hip pain 07/28/2021   Essential hypertension 07/28/2021   Tobacco abuse 07/28/2021   Polyarthritis 07/28/2021   Anal skin tag 10/21/2019   Internal hemorrhoids 09/26/2017   Asthma 10/15/2015   Allergic rhinitis 10/15/2015   Alcohol abuse 07/14/2015   GAD (generalized anxiety disorder) 07/14/2015   Depression, major, recurrent, moderate ([place]) 07/14/2015   GERD (gastroesophageal reflux disease) 08/22/2013   Home Medication(s) Prior to Admission medications   Medication Sig Start Date End Date Taking? Authorizing Provider  ALPRAZolam Duanne Moron) 1 MG tablet Take 1 tablet (1 mg total) by mouth at bedtime as needed for anxiety. Patient taking differently: Take 1 mg by mouth 3 (three) times daily. 10/02/20   Rolland Porter, MD  aspirin 81 MG chewable tablet Chew 1 tablet (81 mg total) by mouth 2 (two) times daily. Patient not taking: Reported on 04/20/2022 02/18/22   Mcarthur Rossetti, MD  aspirin 81 MG chewable tablet Chew 1 tablet (81 mg total) by mouth 2 (  two) times daily. 05/05/22   Mcarthur Rossetti, MD  cyclobenzaprine (FLEXERIL) 10 MG tablet Take 1 tablet (10 mg total) by mouth 3 (three) times daily as needed for muscle spasms. 05/05/22   Mcarthur Rossetti, MD  diphenhydrAMINE (BENADRYL) 25 MG tablet Take 25 mg by mouth every 6 (six) hours as needed for allergies.    [provider]  FLUoxetine (PROZAC) 20 MG capsule Take 80 mg by mouth daily.    [provider]  gabapentin (NEURONTIN) 300  MG capsule TAKE 2 CAPSULES (600 MG TOTAL) BY MOUTH 3 (THREE) TIMES DAILY. Patient taking differently: Take 1,200 mg by mouth 4 (four) times daily. 05/03/18   McLean-Scocuzza, Nino Glow, MD  hydrALAZINE (APRESOLINE) 25 MG tablet TAKE 1 TABLET BY MOUTH THREE TIMES A DAY 04/25/22   Lindell Spar, MD  HYDROcodone-acetaminophen (NORCO) 7.5-325 MG tablet Take 1 tablet by mouth every 6 (six) hours as needed for moderate pain. 05/18/22   Mcarthur Rossetti, MD  levalbuterol [facility] HFA) 45 MCG/ACT inhaler USE AS DIRECTED 05/08/22   Mcarthur Rossetti, MD  levETIRAcetam (KEPPRA) 500 MG tablet Take 1 tablet (500 mg total) by mouth 2 (two) times daily. 01/20/22   Orson Eva, MD  levonorgestrel (MIRENA) 20 MCG/24HR IUD 1 Intra Uterine Device (1 each total) by Intrauterine route once. 07/16/15   Kerrie Buffalo, NP  telmisartan-hydrochlorothiazide (MICARDIS HCT) 40-12.5 MG tablet TAKE 1 TABLET BY MOUTH EVERY DAY 05/15/22   Lindell Spar, MD                                                                                                                                    Past Surgical History Past Surgical History:  Procedure Laterality Date   CESAREAN SECTION  10/15/2009, 02/08/2006   INCISION AND DRAINAGE PERIRECTAL ABSCESS Bilateral 11/22/2016   Procedure: IRRIGATION AND DEBRIDEMENT PERIRECTAL ABSCESS;  Surgeon: [name], MD;  Location: [facility];  Service: General;  Laterality: Bilateral;   INTRAUTERINE DEVICE INSERTION     Mirena 12/24/19 exp 12/28/21 Dr Allyn Kenner [facility]   TOTAL HIP ARTHROPLASTY Left 02/17/2022   Procedure: Left TOTAL HIP ARTHROPLASTY ANTERIOR APPROACH;  Surgeon: Mcarthur Rossetti, MD;  Location: [facility];  Service: Orthopedics;  Laterality: Left;   TOTAL HIP ARTHROPLASTY Right 05/05/2022   Procedure: RIGHT TOTAL HIP ARTHROPLASTY ANTERIOR APPROACH;  Surgeon: Mcarthur Rossetti, MD;  Location: [facility];  Service: Orthopedics;  Laterality: Right;   Family  History Family History  Problem Relation Age of Onset   Rheumatologic disease Mother    Arthritis Mother    Asthma Mother    Depression Mother    Heart disease Father    Asthma Maternal Grandmother    Hypertension Maternal Grandmother    Heart disease Maternal Grandfather    Lung cancer Paternal Grandmother    Healthy Daughter    Healthy Son     Social History Social  History   Tobacco Use   Smoking status: Every Day    Packs/day: 0.25    Years: 10.00    Total pack years: 2.50    Types: Cigarettes   Smokeless tobacco: Never  Vaping Use   Vaping Use: Never used  Substance Use Topics   Alcohol use: Yes   Drug use: Yes    Types: Marijuana    Comment: yesterday   Allergies Penicillins  Review of Systems Review of Systems  Gastrointestinal:  Positive for nausea.  Psychiatric/Behavioral:  Positive for confusion and suicidal ideas.     Physical Exam Vital Signs  I have reviewed the triage vital signs BP 109/83   Pulse (!) 108   Temp 97.8 F (36.6 C) (Oral)   Resp 18   Ht 5\' 4"  (1.626 m)   Wt 65.3 kg   SpO2 100%   BMI 24.72 kg/m   Physical Exam Vitals and nursing note reviewed.  Constitutional:      General: She is not in acute distress.    Appearance: She is well-developed. She is ill-appearing.  HENT:     Head: Normocephalic and atraumatic.     Comments: Dry tacky mucous membranes Eyes:     Conjunctiva/sclera: Conjunctivae normal.  Cardiovascular:     Rate and Rhythm: Regular rhythm. Tachycardia present.     Heart sounds: No murmur heard. Pulmonary:     Effort: Pulmonary effort is normal. No respiratory distress.     Breath sounds: Normal breath sounds.  Abdominal:     Palpations: Abdomen is soft.     Tenderness: There is no abdominal tenderness.  Musculoskeletal:        General: No swelling.     Cervical back: Neck supple.  Skin:    General: Skin is warm and dry.     Capillary Refill: Capillary refill takes less than 2 seconds.   Neurological:     Mental Status: She is alert.  Psychiatric:        Mood and Affect: Mood normal.     ED Results and Treatments Labs (all labs ordered are listed, but only abnormal results are displayed) Labs Reviewed  COMPREHENSIVE METABOLIC PANEL - Abnormal; Notable for the following components:      Result Value   Potassium 3.4 (*)    Glucose, Bld 136 (*)    Creatinine, Ser 1.23 (*)    Calcium 8.6 (*)    AST 84 (*)    ALT 55 (*)    GFR, Estimated 57 (*)    All other components within normal limits  CBC WITH DIFFERENTIAL/PLATELET - Abnormal; Notable for the following components:   RDW 18.5 (*)    All other components within normal limits  ACETAMINOPHEN LEVEL - Abnormal; Notable for the following components:   Acetaminophen (Tylenol), Serum <10 (*)    All other components within normal limits  SALICYLATE LEVEL - Abnormal; Notable for the following components:   Salicylate Lvl [id number] (*)    All other components within normal limits  TSH  ETHANOL  URINALYSIS, ROUTINE W REFLEX MICROSCOPIC  RAPID URINE DRUG SCREEN, HOSP PERFORMED  Radiology CT Head Wo Contrast  Result Date: 06/05/2022 CLINICAL DATA:  Seizure.  Lethargy. EXAM: CT HEAD WITHOUT CONTRAST TECHNIQUE: Contiguous axial images were obtained from the base of the skull through the vertex without intravenous contrast. RADIATION DOSE REDUCTION: This exam was performed according to the departmental dose-optimization program which includes automated exposure control, adjustment of the mA and/or kV according to patient size and/or use of iterative reconstruction technique. COMPARISON:  01/18/2022 FINDINGS: Brain: No evidence of intracranial hemorrhage, acute infarction, hydrocephalus, extra-axial collection, or mass lesion/mass effect. Vascular:  No hyperdense vessel or other acute findings. Skull: No evidence  of fracture or other significant bone abnormality. Sinuses/Orbits:  No acute findings. Other: None. IMPRESSION: Negative noncontrast head CT. Electronically Signed   By: Danae Orleans M.D.   On: 06/05/2022 16:44    Pertinent labs & imaging results that were available during my care of the patient were reviewed by me and considered in my medical decision making (see MDM for details).  Medications Ordered in ED Medications  levETIRAcetam (KEPPRA) IVPB 1000 mg/100 mL premix (1,000 mg Intravenous New Bag/Given 06/05/22 1616)  ALPRAZolam (XANAX) tablet 1 mg (has no administration in time range)  aspirin chewable tablet 81 mg (has no administration in time range)  hydrALAZINE (APRESOLINE) tablet 25 mg (has no administration in time range)  levETIRAcetam (KEPPRA) tablet 500 mg (has no administration in time range)  telmisartan-hydrochlorothiazide (MICARDIS HCT) 40-12.5 MG per tablet 1 tablet (has no administration in time range)  lactated ringers bolus 1,000 mL (1,000 mLs Intravenous New Bag/Given 06/05/22 1612)  ondansetron (ZOFRAN) injection 8 mg (8 mg Intravenous Given 06/05/22 1612)                                                                                                                                     Procedures Procedures  (including critical care time)  Medical Decision Making / ED Course   This patient presents to the ED for concern of intentional overdose, this involves an extensive number of treatment options, and is a complaint that carries with it a high risk of complications and morbidity.  The differential diagnosis includes opioid overdose, benzodiazepine overdose, polypharmacy, alcohol intoxication, metabolic encephalopathy, major depression, psychosis  MDM: Patient seen emergency room for evaluation of an active suicide attempt.  Physical exam reveals a somnolent appearing patient who will awaken and answer questions.  Physical exam also with dry tacky mucous membranes and  a regular tachycardia.  Vitor evaluation with a mild hypokalemia to 3.4, creatinine 1.23, AST 84, ALT 55 likely elevated in the setting of alcohol use.  Aspirin Tylenol negative.  TSH normal.  CT head unremarkable.  Fluid resuscitation improved patient's tachycardia.  Keppra load initiated given unknown compliance with her Keppra.  Patient will need to metabolize the underlying substances she has taken but she is otherwise medically cleared for TTS evaluation.  Disposition pending TTS evaluation.   Additional history obtained: -  Additional history obtained from husband -External records from outside source obtained and reviewed including: Chart review including previous notes, labs, imaging, consultation notes   Lab Tests: -I ordered, reviewed, and interpreted labs.   The pertinent results include:   Labs Reviewed  COMPREHENSIVE METABOLIC PANEL - Abnormal; Notable for the following components:      Result Value   Potassium 3.4 (*)    Glucose, Bld 136 (*)    Creatinine, Ser 1.23 (*)    Calcium 8.6 (*)    AST 84 (*)    ALT 55 (*)    GFR, Estimated 57 (*)    All other components within normal limits  CBC WITH DIFFERENTIAL/PLATELET - Abnormal; Notable for the following components:   RDW 18.5 (*)    All other components within normal limits  ACETAMINOPHEN LEVEL - Abnormal; Notable for the following components:   Acetaminophen (Tylenol), Serum <10 (*)    All other components within normal limits  SALICYLATE LEVEL - Abnormal; Notable for the following components:   Salicylate Lvl <7.0 (*)    All other components within normal limits  TSH  ETHANOL  URINALYSIS, ROUTINE W REFLEX MICROSCOPIC  RAPID URINE DRUG SCREEN, HOSP PERFORMED     Imaging Studies ordered: I ordered imaging studies including CT head I independently visualized and interpreted imaging. I agree with the radiologist interpretation   Medicines ordered and prescription drug management: Meds ordered this encounter   Medications   lactated ringers bolus 1,000 mL   ondansetron (ZOFRAN) injection 8 mg   DISCONTD: levETIRAcetam (KEPPRA) 2,000 mg in sodium chloride 0.9 % 250 mL IVPB   levETIRAcetam (KEPPRA) IVPB 1000 mg/100 mL premix   ALPRAZolam (XANAX) tablet 1 mg   aspirin chewable tablet 81 mg   hydrALAZINE (APRESOLINE) tablet 25 mg   levETIRAcetam (KEPPRA) tablet 500 mg   telmisartan-hydrochlorothiazide (MICARDIS HCT) 40-12.5 MG per tablet 1 tablet    -I have reviewed the patients home medicines and have made adjustments as needed  Critical interventions none  Consultations Obtained: I requested consultation with the TTS providers,  and discussed lab and imaging findings as well as pertinent plan - they recommend: Conditions pending   Cardiac Monitoring: The patient was maintained on a cardiac monitor.  I personally viewed and interpreted the cardiac monitored which showed an underlying rhythm of: Sinus tachycardia  Social Determinants of Health:  Factors impacting patients care include: Worsening depression, daily polysubstance use   Reevaluation: After the interventions noted above, I reevaluated the patient and found that they have :improved  Co morbidities that complicate the patient evaluation  Past Medical History:  Diagnosis Date   Anxiety    Asthma    hosp. 2018 and 2019 no h/o intubation    Depression    GERD (gastroesophageal reflux disease)    Hypertension    Palpitations    Perianal abscess 11/22/2016   12/07/2016.  [facility], [place].  Ovidio Kin, MD.  Perianal abscess is much better but still needs local wound care.  Continue sitz baths 2-3 times perday until all drainage and pain are gone.  Follow up as needed.   Preop general physical exam 01/26/2022   PVC (premature ventricular contraction)    Seasonal allergies    Seizures ([facility])    01/15/22 was last seizure   Tachycardia       Dispostion: I considered admission for this patient, and disposition  pending TTS evaluation     Final Clinical Impression(s) / ED Diagnoses Final diagnoses:  None     @PCDICTATION @    Erla Bacchi, Debe Coder, MD 06/05/22 1758

## 2022-06-05 NOTE — ED Notes (Signed)
Faxed 7 day IVC paperwork to [facility] @ [phone] and [phone].

## 2022-06-05 NOTE — ED Notes (Signed)
Called and faxed IVC papers to magistrate.

## 2022-06-05 NOTE — Progress Notes (Signed)
three silver ball earrings and silver bar removed from patient placed in ziploc bag and given back to patient.

## 2022-06-05 NOTE — ED Triage Notes (Addendum)
Pt here from home, reports driving here, security helped pt into [facility] and brought to registration. Pt says she thinks she had a seizure prior to driving here, pt says she has missed meds x 3 days. Pt alert to self, place, pt unable to state date and time, and situation seems unclear to pt- pt says she has been drinking but unable to quantify how much. Pt says she is thirsty and hasn't eat. Pt says she needs help.

## 2022-06-06 DIAGNOSIS — T1491XA Suicide attempt, initial encounter: Secondary | ICD-10-CM

## 2022-06-06 MED ORDER — ONDANSETRON HCL 4 MG/2ML IJ SOLN
4.0000 mg | Freq: Once | INTRAMUSCULAR | Status: AC
  Administered 2022-06-06: 4 mg via INTRAVENOUS
  Filled 2022-06-06: qty 2

## 2022-06-06 MED ORDER — ALBUTEROL SULFATE HFA 108 (90 BASE) MCG/ACT IN AERS
2.0000 | INHALATION_SPRAY | RESPIRATORY_TRACT | Status: DC | PRN
  Administered 2022-06-06 (×2): 2 via RESPIRATORY_TRACT
  Filled 2022-06-06: qty 6.7

## 2022-06-06 NOTE — ED Notes (Signed)
Pt came out of room stating she was leaving. Staff redirected pt and made her aware she was IVC'd and was unable to leave. Security was called and pt notified security would not let her leave the premesis. Pt went back to the room. Security at the door.

## 2022-06-06 NOTE — ED Notes (Signed)
Pt verbalized she is better now and she only took pills yesterday to get attention from her husband> pt is concerned about her children and wants to go home. Pt is tearful and requested to leave AMA. Nurse explained IVC to the patient and pt began getting upset stating she does not want to go to [facility] again.

## 2022-06-06 NOTE — ED Notes (Signed)
Pt is psych cleared.

## 2022-06-06 NOTE — ED Notes (Signed)
Pt belongings returned to her at bedside.

## 2022-06-06 NOTE — Progress Notes (Signed)
CSW has attached substance abuse  resources to pt's AVS.   Valentina Shaggy.Javia Dillow, MSW, LCSWA [facility]  Transitions of Care Clinical Social Worker I Direct Dial: [phone]  Fax: [phone] [email or web address]

## 2022-06-06 NOTE — ED Notes (Signed)
TTS at this time. 

## 2022-06-06 NOTE — BH Assessment (Addendum)
Comprehensive Clinical Assessment (CCA) Note  06/06/2022 Kelsey [name] [id number] Disposition: Clinician discussed patient care with Rockney Ghee, NP.  She recommends inpatient care for this patient.  Clinician informed RN Darryll Capers and Dr. Kennis Carina of the disposition recommendation via secure messaging.   Flowsheet Row ED from 06/05/2022 in [place] [facility] Admission (Discharged) from 05/05/2022 in [facility] Pre-Admission Testing 60 from 04/26/2022 in [facility]  C-SSRS RISK CATEGORY High Risk No Risk Error: Question 6 not populated      The patient demonstrates the following risk factors for suicide: Chronic risk factors for suicide include: psychiatric disorder of MDD recurrent, severe, substance use disorder, and chronic pain. Acute risk factors for suicide include: family or marital conflict. Protective factors for this patient include: positive social support. Considering these factors, the overall suicide risk at this point appears to be high. Patient is not appropriate for outpatient follow up.  Patient has been depressed about relationship with husband.  Husband reported that patient had a 90 tablet xanax refill on 07/07 but by 07/10 evening there were only 60 in the bottle.  Patient is not responding to internal stimuli.  She does not evidence any delusional thought process.  Patient is clear and coherent.  She is tearful at times and repeats that she wants to go home.  Pt reports that she sleeps fine but that she has lost weight.  Pt was [facility] in [facility].  She has medication management from Dr. Evelene Croon.  She does not have a therapist at this time.  Chief Complaint:  Chief Complaint  Patient presents with   Seizures   Suicide Attempt   Visit Diagnosis: MDD recurrent, severe; ETOH use d/o severe; Cannabis use d/o severe    CCA Screening, Triage and Referral (STR)  Patient Reported Information How  did you hear about [place]? Other (Comment) (Pt was brought to hospital by EMS.)  What Is the Reason for Your Visit/Call Today? Pt took 5-6 hydrocodone and 4-5 xanax sometime on Monday (07/10).  Pt says that she did not drink ETOH also.  Pt says she last drank about a couple weeks ago.  Her husband said she has been taking xanax and drinking all week.  Pt says "yes and no" when asked if this was a suicide attempt.  She says she is depressed because her husband is leaving her.  They have been arguing a lot this last week.  Pt denies any previous suicide attempts.  Pt denies any HI or A/V hallucinations.  Pt is seen by Dr. Evelene Croon for psychiatric med managment.  Does not have a therapist but wants one.  Pt has been sleeping through the night and her appetite has been normal.  Pt denies any access to weapons.  How Long Has This Been Causing You Problems? <Week  What Do You Feel Would Help You the Most Today? Treatment for Depression or other mood problem   Have You Recently Had Any Thoughts About Hurting Yourself? Yes  Are You Planning to Commit Suicide/Harm Yourself At This time? Yes   Have you Recently Had Thoughts About Hurting Someone Karolee Ohs? No  Are You Planning to Harm Someone at This Time? No  Explanation: No data recorded  Have You Used Any Alcohol or Drugs in the Past 24 Hours? Yes  How Long Ago Did You Use Drugs or Alcohol? No data recorded What Did You Use and How Much? benzos (is prescribed xanax   Do  You Currently Have a Therapist/Psychiatrist? Yes  Name of Therapist/Psychiatrist: Dr. Evelene Croon for med management   Have You Been Recently Discharged From Any Office Practice or Programs? No  Explanation of Discharge From Practice/Program: No data recorded    CCA Screening Triage Referral Assessment Type of Contact: Tele-Assessment  Telemedicine Service Delivery:   Is this Initial or Reassessment? Initial Assessment  Date Telepsych consult ordered in [facility]:  06/06/22  Time Telepsych  consult ordered in [facility]:  1755  Location of Assessment: [facility]  Provider Location: [facility]   Collateral Involvement: No data recorded  Does Patient Have a Court Appointed Legal Guardian? No data recorded Name and Contact of Legal Guardian: No data recorded If Minor and Not Living with Parent(s), Who has Custody? No data recorded Is CPS involved or ever been involved? No data recorded Is APS involved or ever been involved? Never   Patient Determined To Be At Risk for Harm To Self or Others Based on Review of Patient Reported Information or Presenting Complaint? Yes, for Self-Harm  Method: No data recorded Availability of Means: No data recorded Intent: No data recorded Notification Required: No data recorded Additional Information for Danger to Others Potential: No data recorded Additional Comments for Danger to Others Potential: No data recorded Are There Guns or Other Weapons in Your Home? No data recorded Types of Guns/Weapons: No data recorded Are These Weapons Safely Secured?                            No data recorded Who Could Verify You Are Able To Have These Secured: No data recorded Do You Have any Outstanding Charges, Pending Court Dates, Parole/Probation? No data recorded Contacted To Inform of Risk of Harm To Self or Others: No data recorded   Does Patient Present under Involuntary Commitment? 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   Mental Health Symptoms Depression:   Change in energy/activity; Difficulty Concentrating; Hopelessness; Worthlessness   Duration of Depressive symptoms:   Duration of Depressive Symptoms: Greater than two weeks   Mania:   None   Anxiety:    Worrying; Tension; Difficulty concentrating   Psychosis:   None   Duration of Psychotic symptoms:    Trauma:   None   Obsessions:   None   Compulsions:   None   Inattention:   None   Hyperactivity/Impulsivity:   None   Oppositional/Defiant Behaviors:  No data recorded  Emotional Irregularity:   Chronic feelings of emptiness   Other Mood/Personality Symptoms:  No data recorded   Mental Status Exam Appearance and self-care  Stature:   Average   Weight:   Average weight   Clothing:   Casual   Grooming:   Neglected   Cosmetic use:   Age appropriate   Posture/gait:   Normal   Motor activity:   Not Remarkable   Sensorium  Attention:   Normal   Concentration:   Anxiety interferes   Orientation:   X5   Recall/memory:   Normal   Affect and Mood  Affect:   Anxious; Depressed   Mood:  Anxious; Depressed   Relating  Eye contact:   Fleeting   Facial expression:   Anxious; Depressed   Attitude toward examiner:   Cooperative   Thought and Language  Speech flow:  Clear and Coherent   Thought content:   Appropriate to Mood and Circumstances   Preoccupation:   Somatic   Hallucinations:   None   Organization:  No data recorded  [employer] of Knowledge:   Average   Intelligence:   Average   Abstraction:   Normal   Judgement:   Poor; Impaired   Reality Testing:   Realistic   Insight:   Fair   Decision Making:   Confused   Social Functioning  Social Maturity:   Impulsive   Social Judgement:   Normal   Stress  Stressors:   Family conflict   Coping Ability:  No data recorded  Skill Deficits:   Decision making   Supports:   Family     Religion: Religion/Spirituality Are You A Religious Person?: Yes What is Your Religious Affiliation?: Chiropodist: Leisure / Recreation Do You  Have Hobbies?: No  Exercise/Diet: Exercise/Diet Do You Exercise?: No Have You Gained or Lost A Significant Amount of Weight in the Past Six Months?: Yes-Lost Number of Pounds Lost?: 10 Do You Follow a Special Diet?: No Do You Have Any Trouble Sleeping?: No   CCA Employment/Education Employment/Work Situation: Employment / Work Situation Employment Situation: Employed Patient's Job has Been Impacted by Current Illness: No Has Patient ever Been in Equities trader?: No  Education: Education Is Patient Currently Attending School?: No Did You Product manager?: Yes What Type of College Degree Do you Have?: Associate's Degree Did You Have An Individualized Education Program (IIEP): No   CCA Family/Childhood History Family and Relationship History: Family history Marital status: Married Number of Years Married: 3 (2nd husband) Does patient have children?: Yes How many children?: 2 How is patient's relationship with their children?: pt has 20 year old son and 41 year old daughter. pt reports that she decided to get help when her son saw her intoxicated and stated that he could no longer trust her. pt tearful when talking about this incident.   Childhood History:  Childhood History By whom was/is the patient raised?: Both parents Did patient suffer any verbal/emotional/physical/sexual abuse as a child?: No Has patient ever been sexually abused/assaulted/raped as an adolescent or adult?: No Witnessed domestic violence?: No Has patient been affected by domestic violence as an adult?: No  Child/Adolescent Assessment:     CCA Substance Use Alcohol/Drug Use: Alcohol / Drug Use Pain Medications: Hydrocodone? Prescriptions: Xanax, Keppra Over the Counter: Pt cannot recall History of alcohol / drug use?: Yes Withdrawal Symptoms: None Substance #1 Name of Substance 1: ETOH 1 - Age of First Use: "I don't even know" 1 - Amount (size/oz): "maybe a shot or two" 1 - Frequency: Vary 1 -  Duration: off and on 1 - Last Use / Amount: Pt reports 2 weeks ago 1 - Method of Aquiring: purchase 1- Route of Use: oral Substance #2 Name of Substance 2: Marijuana 2 - Age of First Use: teens 2 - Amount (size/oz): "no more than 3 joints" 2 - Frequency: 4-5 times a week "I smoke it every day." 2 - Duration: ongoing 2 - Last Use / Amount: Two weeks ago 2 - Method of Aquiring: illegal purchase 2 - Route of Substance Use: smoking  ASAM's:  Six Dimensions of Multidimensional Assessment  Dimension 1:  Acute Intoxication and/or Withdrawal Potential:      Dimension 2:  Biomedical Conditions and Complications:      Dimension 3:  Emotional, Behavioral, or Cognitive Conditions and Complications:     Dimension 4:  Readiness to Change:     Dimension 5:  Relapse, Continued use, or Continued Problem Potential:     Dimension 6:  Recovery/Living Environment:     ASAM Severity Score:    ASAM Recommended Level of Treatment:     Substance use Disorder (SUD)    Recommendations for Services/Supports/Treatments:    Discharge Disposition:    DSM5 Diagnoses: Patient Active Problem List   Diagnosis Date Noted   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   Hospital discharge follow-up 02/23/2022   Status post left hip replacement 02/17/2022   Transaminitis 01/26/2022   Macrocytic anemia 01/26/2022   Thrombocytopenia (HCC) 01/18/2022   Avascular necrosis of bone of left hip (HCC) 12/20/2021   Avascular necrosis of bone of right hip (HCC) 12/20/2021   Lumbar spondylosis 10/05/2021   Positive double stranded DNA antibody test 09/07/2021   Mixed hyperlipidemia 08/16/2021   Seizure disorder (HCC) 07/28/2021   Chronic hip pain 07/28/2021   Essential hypertension 07/28/2021   Tobacco abuse 07/28/2021   Polyarthritis 07/28/2021   Anal skin tag 10/21/2019   Internal hemorrhoids 09/26/2017   Asthma 10/15/2015   Allergic rhinitis 10/15/2015   Alcohol  abuse 07/14/2015   GAD (generalized anxiety disorder) 07/14/2015   Depression, major, recurrent, moderate (HCC) 07/14/2015   GERD (gastroesophageal reflux disease) 08/22/2013     Referrals to Alternative Service(s): Referred to Alternative Service(s):   Place:   Date:   Time:    Referred to Alternative Service(s):   Place:   Date:   Time:    Referred to Alternative Service(s):   Place:   Date:   Time:    Referred to Alternative Service(s):   Place:   Date:   Time:     Wandra Mannan

## 2022-06-06 NOTE — Discharge Instructions (Signed)
[facility]  The Pt can call this number to see if there are any resources that may be available  [phone]

## 2022-06-06 NOTE — BH Assessment (Signed)
Pt's IVC states:  "Active SI with current suicide attempt. OD'ed intentionally today on opioids and benzodiazepines. Danger to self."  Dr. Glendora Score, EDP/Petitioner [phone]

## 2022-06-06 NOTE — ED Notes (Signed)
Pt keep coming out of room trying to leave and stating (I'm gonna leave)

## 2022-06-06 NOTE — Consult Note (Cosign Needed Addendum)
Telepsych Consultation   Reason for Consult: Suicide attempt Referring Physician:  Dr. Posey Rea Location of Patient: [facility] Location of Provider: [facility]  Patient Identification: Kelsey [name] MRN:  [id number] Principal Diagnosis: <principal problem not specified> Diagnosis:  Active Problems:   * No active hospital problems. *   Total Time spent with patient: 1 hour  Subjective:   Kelsey [name] is a 41 y.o. female patient.  HPI:  As per [facility] initial intake note: Kelsey [name] is a 41 y.o. female with PMH alcohol abuse, seizures on Keppra, HTN, postop right total hip replacement on 05/05/2022 who presents emergency department for evaluation of altered mental status and a suicide attempt.  Patient is quite somnolent on arrival and history is difficult to obtain but she states that she has been drinking alcohol and taking "pain pills" that include opioids and home Xanax in an attempt to kill herself.  She states that she took at least 5-6 hydrocodone pills and an unknown amount of Xanax sometime this morning.  She is unsure of exact time of ingestion.  Additional history obtained from patient's husband who has not been with her today as he has been at work but states that she has been drinking and taking Xanax all week long.  Additional history unable to be obtained given patient's current mental status. Of note, patient has a previous admission for status epilepticus and has an intermittent history of noncompliance with her Keppra and is unsure if she had a seizure today.   On assessment today via telepsych, patient is examined lying in bed.  Chart reviewed and findings shared with the treatment team and discussed with Dr. Lucianne Muss.  Alert and oriented x 4 to person, place, time, and situation.  However, unable to remember how she came to the hospital, if she drove herself or came by ambulance.  Cooperative and tearful during encounter, repeating several times, "I want to go  home."  Able to maintain fair eye contact with provider.  Mood anxious and dysphoric.  Affect congruent and tearful.  Thought process coherent and linear with thought content logical.  Memory, judgment, and insight fair. Patient reports taking too many 1 mg Xanax at least 5 to 6 pills yesterday, 3-4 oxycodone tablets and 2 shots of vodka yesterday.  Reports the trigger for her behavior was because she thought her husband was living her and wanted to get his attention. Reports she had stomach bug and was vomiting for about 3 days and was not able to keep her seizures medication down.  Was not sure if she had seizures yesterday prior to arrival at the hospital. She denies SI, HI, AVH, paranoia or delusions today and able to contract for safety. Reports sleeping up to 8 hours last night and being restful, and reports good appetite. Reports being safe at home and denies access to firearms. Denies self-injurious behavior currently or in the past and denies any history of abuse or trauma. Denies being followed by a therapist or a psychiatrist.  However has a primary care provider, Dr. Allena Katz in [place] who manages her medications. Endorses past psychiatric diagnoses of major depressive disorder, recurrent moderate, and generalized anxiety disorder. Reports family history of mental illness, with mother diagnosed with depression. Reports that she has problem with alcohol use.  Reports being admitted to [facility] 6 or 7 years ago for alcohol use disorder was treated and released after 3 days. Denies illegal drug use, however, endorses abuse of Xanax and oxycodone prescribed for  her anxiety and chronic pain. Endorse tobacco use and smoking 1 stick of cigarette daily and smoking "3 small hits" of marijuana 2 times daily.  Added, "I have asthma as a medical diagnosis and I do not do too much of smoking."  Instructions provided on cessation of polysubstance use and tobacco smoking as they have negative consequences for  the body system.  Collateral information: Danny Clack at [phone] called for more information.  Spouse stated that as far as he knows, patient took up to 30 pills of Xanax between Friday and Monday and drinking alcohol in between. He noted that patient can come home, but if she continues with drinking and abusing narcotics, that she would be by herself. Patient made aware of spouse information.  Disposition: Patient does not meet criteria for psychiatric inpatient admission, thus patient is psych cleared as she is not at imminent danger to herself or others.  TOC consult ordered for Substance Abuse Counseling and Education. She can be discharged to home when medically stable. [facility] tx team and [facility] made aware of patient disposition.  Past Psychiatric History: Alcohol abuse, major depressive disorder, recurrent moderate, generalized anxiety disorder, tobacco abuse, and seizures disorder.  Risk to Self:  NO Risk to Others:  NO Prior Inpatient Therapy:  YES Prior Outpatient Therapy: NO   Past Medical History:  Past Medical History:  Diagnosis Date   Anxiety    Asthma    hosp. 2018 and 2019 no h/o intubation    Depression    GERD (gastroesophageal reflux disease)    Hypertension    Palpitations    Perianal abscess 11/22/2016   12/07/2016.  [facility], [place].  Ovidio Kin, MD.  Perianal abscess is much better but still needs local wound care.  Continue sitz baths 2-3 times perday until all drainage and pain are gone.  Follow up as needed.   Preop general physical exam 01/26/2022   PVC (premature ventricular contraction)    Seasonal allergies    Seizures (HCC)    01/15/22 was last seizure   Tachycardia     Past Surgical History:  Procedure Laterality Date   CESAREAN SECTION  10/15/2009, 02/08/2006   INCISION AND DRAINAGE PERIRECTAL ABSCESS Bilateral 11/22/2016   Procedure: IRRIGATION AND DEBRIDEMENT PERIRECTAL ABSCESS;  Surgeon: Ovidio Kin, MD;  Location: [facility];   Service: General;  Laterality: Bilateral;   INTRAUTERINE DEVICE INSERTION     Mirena 12/24/19 exp 12/28/21 Dr Philip Aspen [facility]   TOTAL HIP ARTHROPLASTY Left 02/17/2022   Procedure: Left TOTAL HIP ARTHROPLASTY ANTERIOR APPROACH;  Surgeon: Kathryne Hitch, MD;  Location: [facility];  Service: Orthopedics;  Laterality: Left;   TOTAL HIP ARTHROPLASTY Right 05/05/2022   Procedure: RIGHT TOTAL HIP ARTHROPLASTY ANTERIOR APPROACH;  Surgeon: Kathryne Hitch, MD;  Location: [facility];  Service: Orthopedics;  Laterality: Right;   Family History:  Family History  Problem Relation Age of Onset   Rheumatologic disease Mother    Arthritis Mother    Asthma Mother    Depression Mother    Heart disease Father    Asthma Maternal Grandmother    Hypertension Maternal Grandmother    Heart disease Maternal Grandfather    Lung cancer Paternal Grandmother    Healthy Daughter    Healthy Son    Family Psychiatric  History: Patient's mother diagnosed with depression.  Social History:  Social History   Substance and Sexual Activity  Alcohol Use Yes     Social History  Substance and Sexual Activity  Drug Use Yes   Types: Marijuana   Comment: yesterday    Social History   Socioeconomic History   Marital status: Married    Spouse name: Not on file   Number of children: 2   Years of education: Not on file   Highest education level: Not on file  Occupational History   Occupation: respiratory therapists    Employer: [employer]  Tobacco Use   Smoking status: Every Day    Packs/day: 0.25    Years: 10.00    Total pack years: 2.50    Types: Cigarettes   Smokeless tobacco: Never  Vaping Use   Vaping Use: Never used  Substance and Sexual Activity   Alcohol use: Yes   Drug use: Yes    Types: Marijuana    Comment: yesterday   Sexual activity: Not on file  Other Topics Concern   Not on file  Social History Narrative   Respiratory Therapist, 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     College ed    2 kids    Divorced but remarried since 05/2019 new last name will be Chestine Spore   Wears seat belt, feels safe in relationship    Social Determinants of Health   Financial Resource Strain: Not on file  Food Insecurity: Not on file  Transportation Needs: Not on file  Physical Activity: Not on file  Stress: Not on file  Social Connections: Not on file   Additional Social History:    Allergies:   Allergies  Allergen Reactions   Penicillins Rash    Has patient had a PCN reaction causing immediate rash, facial/tongue/throat swelling, SOB or lightheadedness with hypotension: No Has patient had a PCN reaction causing severe rash involving mucus membranes or skin necrosis: No Has patient had a PCN reaction that required hospitalization No Has patient had a PCN reaction occurring within the last 10 years: No If all of the above answers are "NO", then may proceed with Cephalosporin use.    Labs:  Results for orders placed or performed during the hospital encounter of 06/05/22 (from the past 48 hour(s))  Comprehensive metabolic panel     Status: Abnormal   Collection Time: 06/05/22  4:11 PM  Result Value Ref Range   Sodium 140 135 - 145 mmol/L   Potassium 3.4 (L) 3.5 - 5.1 mmol/L   Chloride 105 98 - 111 mmol/L   CO2 24 22 - 32 mmol/L   Glucose, Bld 136 (H) 70 - 99 mg/dL    Comment: Glucose reference range applies only to samples taken after fasting for at least 8 hours.   BUN 8 6 - 20 mg/dL   Creatinine, Ser 0.76 (H) 0.44 - 1.00 mg/dL   Calcium 8.6 (L) 8.9 - 10.3 mg/dL   Total Protein 8.0 6.5 - 8.1 g/dL   Albumin 3.8 3.5 - 5.0 g/dL   AST 84 (H) 15 - 41 U/L   ALT 55 (H) 0 - 44 U/L   Alkaline Phosphatase 121 38 - 126 U/L   Total Bilirubin 0.7 0.3 - 1.2 mg/dL   GFR, Estimated 57 (L) >60 mL/min    Comment: (NOTE) Calculated using the CKD-EPI Creatinine Equation (2021)    Anion gap 11 5 - 15    Comment: Performed at [facility], [address], [place], [place] [address]   CBC with Differential     Status: Abnormal   Collection Time: 06/05/22  4:11 PM  Result Value Ref Range   WBC  9.3 4.0 - 10.5 K/uL   RBC 4.62 3.87 - 5.11 MIL/uL   Hemoglobin 14.6 12.0 - 15.0 g/dL   HCT 19.1 47.8 - 29.5 %   MCV 98.1 80.0 - 100.0 fL   MCH 31.6 26.0 - 34.0 pg   MCHC 32.2 30.0 - 36.0 g/dL   RDW 62.1 (H) 30.8 - 65.7 %   Platelets 331 150 - 400 K/uL   nRBC 0.0 0.0 - 0.2 %   Neutrophils Relative % 80 %   Neutro Abs 7.4 1.7 - 7.7 K/uL   Lymphocytes Relative 15 %   Lymphs Abs 1.4 0.7 - 4.0 K/uL   Monocytes Relative 5 %   Monocytes Absolute 0.5 0.1 - 1.0 K/uL   Eosinophils Relative 0 %   Eosinophils Absolute 0.0 0.0 - 0.5 K/uL   Basophils Relative 0 %   Basophils Absolute 0.0 0.0 - 0.1 K/uL   Immature Granulocytes 0 %   Abs Immature Granulocytes 0.03 0.00 - 0.07 K/uL    Comment: Performed at [facility], [address], [place], [place] [address]  TSH     Status: None   Collection Time: 06/05/22  4:11 PM  Result Value Ref Range   TSH 0.784 0.350 - 4.500 uIU/mL    Comment: Performed by a 3rd Generation assay with a functional sensitivity of <=0.01 uIU/mL. Performed at [facility], [address], [place], [place] [address]   Acetaminophen level     Status: Abnormal   Collection Time: 06/05/22  4:11 PM  Result Value Ref Range   Acetaminophen (Tylenol), Serum <10 (L) 10 - 30 ug/mL    Comment: (NOTE) Therapeutic concentrations vary significantly. A range of 10-30 ug/mL  may be an effective concentration for many patients. However, some  are best treated at concentrations outside of this range. Acetaminophen concentrations >150 ug/mL at 4 hours after ingestion  and >50 ug/mL at 12 hours after ingestion are often associated with  toxic reactions.  Performed at [facility], [address] [place], [place] [address]   Salicylate level     Status: Abnormal   Collection Time: 06/05/22  4:11 PM  Result Value Ref Range   Salicylate Lvl <7.0 (L) 7.0 - 30.0 mg/dL     Comment: Performed at [facility], [address] [place], [place] [address]  Ethanol     Status: None   Collection Time: 06/05/22  4:11 PM  Result Value Ref Range   Alcohol, Ethyl (B) <10 <10 mg/dL    Comment: (NOTE) Lowest detectable limit for serum alcohol is 10 mg/dL.  For medical purposes only. Performed at [facility], [address], [place], [place] [address]   Urinalysis, Routine w reflex microscopic Urine, Clean Catch     Status: Abnormal   Collection Time: 06/05/22 11:00 PM  Result Value Ref Range   Color, Urine STRAW (A) YELLOW   APPearance CLEAR CLEAR   Specific Gravity, Urine 1.008 1.005 - 1.030   pH 7.0 5.0 - 8.0   Glucose, UA NEGATIVE NEGATIVE mg/dL   Hgb urine dipstick NEGATIVE NEGATIVE   Bilirubin Urine NEGATIVE NEGATIVE   Ketones, ur 20 (A) NEGATIVE mg/dL   Protein, ur NEGATIVE NEGATIVE mg/dL   Nitrite NEGATIVE NEGATIVE   Leukocytes,Ua NEGATIVE NEGATIVE    Comment: Performed at [facility], [address] [place], [place] [address]  Rapid urine drug screen (hospital performed)     Status: Abnormal   Collection Time: 06/05/22 11:00 PM  Result Value Ref Range   Opiates  POSITIVE (A) NONE DETECTED   Cocaine NONE DETECTED NONE DETECTED   Benzodiazepines POSITIVE (A) NONE DETECTED   Amphetamines NONE DETECTED NONE DETECTED   Tetrahydrocannabinol POSITIVE (A) NONE DETECTED   Barbiturates NONE DETECTED NONE DETECTED    Comment: (NOTE) DRUG SCREEN FOR MEDICAL PURPOSES ONLY.  IF CONFIRMATION IS NEEDED FOR ANY PURPOSE, NOTIFY LAB WITHIN 5 DAYS.  LOWEST DETECTABLE LIMITS FOR URINE DRUG SCREEN Drug Class                     Cutoff (ng/mL) Amphetamine and metabolites    1000 Barbiturate and metabolites    200 Benzodiazepine                 200 Tricyclics and metabolites     300 Opiates and metabolites        300 Cocaine and metabolites        300 THC                            50 Performed at [facility], [address] [place], [place] [address]      Medications:  Current Facility-Administered Medications  Medication Dose Route Frequency Provider Last Rate Last Admin   albuterol (VENTOLIN HFA) 108 (90 Base) MCG/ACT inhaler 2 puff  2 puff Inhalation Q4H PRN Sabas Sous, MD   2 puff at 06/06/22 0840   ALPRAZolam Prudy Feeler) tablet 1 mg  1 mg Oral TID PRN Kommor, Wyn Forster, MD   1 mg at 06/06/22 1572   aspirin chewable tablet 81 mg  81 mg Oral BID Kommor, Madison, MD   81 mg at 06/05/22 2253   hydrALAZINE (APRESOLINE) tablet 25 mg  25 mg Oral TID Kommor, Wyn Forster, MD   25 mg at 06/05/22 1834   irbesartan (AVAPRO) tablet 150 mg  150 mg Oral Daily Kommor, Madison, MD   150 mg at 06/05/22 6203   And   hydrochlorothiazide (HYDRODIURIL) tablet 12.5 mg  12.5 mg Oral Daily Kommor, Madison, MD   12.5 mg at 06/05/22 1834   levETIRAcetam (KEPPRA) tablet 500 mg  500 mg Oral BID Kommor, Madison, MD   500 mg at 06/05/22 2253   Current Outpatient Medications  Medication Sig Dispense Refill   ALPRAZolam (XANAX) 1 MG tablet Take 1 tablet (1 mg total) by mouth at bedtime as needed for anxiety. (Patient taking differently: Take 1 mg by mouth 3 (three) times daily.) 7 tablet 0   aspirin 81 MG chewable tablet Chew 1 tablet (81 mg total) by mouth 2 (two) times daily. (Patient not taking: Reported on 04/20/2022) 30 tablet 0   aspirin 81 MG chewable tablet Chew 1 tablet (81 mg total) by mouth 2 (two) times daily. 30 tablet 0   cyclobenzaprine (FLEXERIL) 10 MG tablet Take 1 tablet (10 mg total) by mouth 3 (three) times daily as needed for muscle spasms. 60 tablet 2   diphenhydrAMINE (BENADRYL) 25 MG tablet Take 25 mg by mouth every 6 (six) hours as needed for allergies.     FLUoxetine (PROZAC) 20 MG capsule Take 80 mg by mouth daily.     gabapentin (NEURONTIN) 300 MG capsule TAKE 2 CAPSULES (600 MG TOTAL) BY MOUTH 3 (THREE) TIMES DAILY. (Patient taking differently: Take 1,200 mg by mouth 4 (four) times daily.) 540 capsule 1   hydrALAZINE (APRESOLINE) 25 MG tablet  TAKE 1 TABLET BY MOUTH THREE TIMES A DAY 270 tablet 2   HYDROcodone-acetaminophen (NORCO)  7.5-325 MG tablet Take 1 tablet by mouth every 6 (six) hours as needed for moderate pain. 30 tablet 0   levalbuterol (XOPENEX HFA) 45 MCG/ACT inhaler USE AS DIRECTED 1 each 0   levETIRAcetam (KEPPRA) 500 MG tablet Take 1 tablet (500 mg total) by mouth 2 (two) times daily. 60 tablet 1   levonorgestrel (MIRENA) 20 MCG/24HR IUD 1 Intra Uterine Device (1 each total) by Intrauterine route once. 1 each 0   telmisartan-hydrochlorothiazide (MICARDIS HCT) 40-12.5 MG tablet TAKE 1 TABLET BY MOUTH EVERY DAY 90 tablet 1    Musculoskeletal: Strength & Muscle Tone: within normal limits Gait & Station: normal Patient leans: N/A  Psychiatric Specialty Exam:  Presentation  General Appearance: Appropriate for Environment; Casual; Fairly Groomed  Eye Contact:Fair  Speech:Clear and Coherent; Normal Rate  Speech Volume:Normal  Handedness:Right  Mood and Affect  Mood:Anxious; Dysphoric  Affect:Congruent; Tearful  Thought Process  Thought Processes:Coherent; Linear  Descriptions of Associations:Intact  Orientation:Full (Time, Place and Person)  Thought Content:Logical; WDL  History of Schizophrenia/Schizoaffective disorder:No  Duration of Psychotic Symptoms:No data recorded Hallucinations:Hallucinations: None  Ideas of Reference:None  Suicidal Thoughts:Suicidal Thoughts: No  Homicidal Thoughts:Homicidal Thoughts: No  Sensorium  Memory:Immediate Fair; Recent Fair; Remote Fair  Judgment:Fair  Insight:Fair  Executive Functions  Concentration:Fair  Attention Span:Fair  Recall:Fair  Fund of Knowledge:Fair  Language:Good  Psychomotor Activity  Psychomotor Activity:Psychomotor Activity: Normal  Assets  Assets:Communication Skills; Physical Health  Sleep  Sleep:Sleep: Good Number of Hours of Sleep: 8  Physical Exam: Physical Exam Vitals and nursing note reviewed.   Constitutional:      Appearance: Normal appearance.  HENT:     Head: Normocephalic and atraumatic.     Right Ear: External ear normal.     Left Ear: External ear normal.     Nose: Nose normal.     Mouth/Throat:     Mouth: Mucous membranes are moist.     Pharynx: Oropharynx is clear.  Eyes:     Extraocular Movements: Extraocular movements intact.     Conjunctiva/sclera: Conjunctivae normal.     Pupils: Pupils are equal, round, and reactive to light.  Cardiovascular:     Rate and Rhythm: Normal rate.     Pulses: Normal pulses.  Pulmonary:     Effort: Pulmonary effort is normal.  Abdominal:     Palpations: Abdomen is soft.  Genitourinary:    Comments: Deferred Musculoskeletal:        General: Normal range of motion.     Cervical back: Normal range of motion and neck supple.  Skin:    General: Skin is warm.  Neurological:     General: No focal deficit present.     Mental Status: She is alert and oriented to person, place, and time.  Psychiatric:     Comments: Crying and wanting to go home    Review of Systems  Constitutional: Negative.  Negative for chills and fever.  HENT: Negative.  Negative for hearing loss and tinnitus.   Eyes: Negative.  Negative for blurred vision and double vision.  Respiratory: Negative.  Negative for cough, sputum production, shortness of breath and wheezing.   Cardiovascular: Negative.  Negative for chest pain and palpitations.  Gastrointestinal: Negative.  Negative for heartburn, nausea and vomiting.  Genitourinary: Negative.  Negative for dysuria and urgency.  Musculoskeletal: Negative.  Negative for myalgias and neck pain.  Skin: Negative.  Negative for itching and rash.  Neurological:  Positive for seizures (History of seizures). Negative for dizziness, tingling, tremors,  sensory change, speech change, focal weakness, loss of consciousness, weakness and headaches.  Endo/Heme/Allergies: Negative.  Negative for environmental allergies and  polydipsia. Does not bruise/bleed easily.       Penicillins Penicillins  Rash Low  04/29/2011 Has patient had a PCN reaction causing immediate rash, facial/tongue/throat swelling, SOB or lightheadedness with hypotension: No  Has patient had a PCN reaction causing severe rash involving mucus membranes or skin necrosis: No  Has patient had a PCN reaction that required hospitalization No  Has patient had a PCN reaction occurring within the last 10 years: No  If all of the above answers are "NO", then may proceed with Cephalosporin use.    Psychiatric/Behavioral:  Positive for depression, substance abuse and suicidal ideas. The patient is nervous/anxious.    Blood pressure 91/66, pulse 93, temperature 97.9 F (36.6 C), temperature source Axillary, resp. rate 19, height 5\' 4"  (1.626 m), weight 65.3 kg, SpO2 93 %. Body mass index is 24.72 kg/m.  Treatment Plan Summary: Daily contact with patient to assess and evaluate symptoms and progress in treatment and Medication management  Disposition: No evidence of imminent risk to self or others at present.   Patient does not meet criteria for psychiatric inpatient admission. Supportive therapy provided about ongoing stressors. Discussed crisis plan, support from social network, calling 911, coming to the Emergency Department, and calling Suicide Hotline.  This service was provided via telemedicine using a 2-way, interactive audio and video technology.  Names of all persons participating in this telemedicine service and their role in this encounter. Name: Onnie BoerJennifer Sia Role: Patient's  Name: Alan Mulderina Dorman Calderwood, NP Role: Provider  Name: Dr. Posey ReaKommor Role: [facility]  Name: Dr. Lucianne MussKumar Role: Medical Director    Cecilie Lowersina C Carmell Elgin, FNP 06/06/2022 10:28 AM

## 2022-06-06 NOTE — ED Notes (Signed)
Pt loudly gagging in room. Pt c/o nausea, Dr notified.

## 2022-06-06 NOTE — ED Notes (Signed)
Pt belongings and wedding rings with baggy from [facility] jewelry in locker.

## 2022-06-08 ENCOUNTER — Encounter: Payer: Self-pay | Admitting: Orthopaedic Surgery

## 2022-06-15 ENCOUNTER — Encounter: Payer: Self-pay | Admitting: Orthopaedic Surgery

## 2022-06-15 ENCOUNTER — Encounter: Payer: Commercial Managed Care - PPO | Admitting: Orthopaedic Surgery

## 2022-06-15 ENCOUNTER — Telehealth: Payer: Self-pay

## 2022-06-15 NOTE — Telephone Encounter (Signed)
Triage call: the patient was supposed to come in this afternoon for a postop visit and discuss possibly returning to work. She said she is needing to go somewhere now to be treated for alcoholism and needs to cancel this appointment (was crying throughout the call). She did say she is not drinking now and feels safe to drive herself to the treatment center. I asked if there was anyone else that can take her and she said no, that she "has ruined everything." She also said she has been using alcohol to treat her pain, even though the hip "seems to be getting better." Victorino Dike wanted Dr. Magnus Ivan to know what was going on with her.

## 2022-06-15 NOTE — Telephone Encounter (Signed)
New note put in for [facility]

## 2022-06-15 NOTE — Telephone Encounter (Signed)
FYI

## 2022-06-19 ENCOUNTER — Inpatient Hospital Stay (HOSPITAL_COMMUNITY)
Admission: AD | Admit: 2022-06-19 | Discharge: 2022-06-23 | DRG: 885 | Disposition: A | Discharge status: Other Acute Inpatient Hospital | Payer: Commercial Managed Care - PPO | Attending: Psychiatry | Admitting: Psychiatry

## 2022-06-19 DIAGNOSIS — J4520 Mild intermittent asthma, uncomplicated: Secondary | ICD-10-CM | POA: Diagnosis not present

## 2022-06-19 DIAGNOSIS — Z635 Disruption of family by separation and divorce: Secondary | ICD-10-CM

## 2022-06-19 DIAGNOSIS — F17200 Nicotine dependence, unspecified, uncomplicated: Secondary | ICD-10-CM

## 2022-06-19 DIAGNOSIS — I10 Essential (primary) hypertension: Secondary | ICD-10-CM | POA: Diagnosis present

## 2022-06-19 DIAGNOSIS — J45909 Unspecified asthma, uncomplicated: Secondary | ICD-10-CM | POA: Diagnosis present

## 2022-06-19 DIAGNOSIS — T426X6A Underdosing of other antiepileptic and sedative-hypnotic drugs, initial encounter: Secondary | ICD-10-CM | POA: Diagnosis present

## 2022-06-19 DIAGNOSIS — Z801 Family history of malignant neoplasm of trachea, bronchus and lung: Secondary | ICD-10-CM | POA: Diagnosis not present

## 2022-06-19 DIAGNOSIS — Z880 Allergy status to penicillin: Secondary | ICD-10-CM

## 2022-06-19 DIAGNOSIS — F17210 Nicotine dependence, cigarettes, uncomplicated: Secondary | ICD-10-CM | POA: Diagnosis present

## 2022-06-19 DIAGNOSIS — E876 Hypokalemia: Secondary | ICD-10-CM | POA: Diagnosis present

## 2022-06-19 DIAGNOSIS — Z79899 Other long term (current) drug therapy: Secondary | ICD-10-CM | POA: Diagnosis not present

## 2022-06-19 DIAGNOSIS — Z8261 Family history of arthritis: Secondary | ICD-10-CM

## 2022-06-19 DIAGNOSIS — Z20822 Contact with and (suspected) exposure to covid-19: Secondary | ICD-10-CM | POA: Diagnosis present

## 2022-06-19 DIAGNOSIS — E782 Mixed hyperlipidemia: Secondary | ICD-10-CM | POA: Diagnosis not present

## 2022-06-19 DIAGNOSIS — G40A09 Absence epileptic syndrome, not intractable, without status epilepticus: Secondary | ICD-10-CM | POA: Diagnosis present

## 2022-06-19 DIAGNOSIS — Z91410 Personal history of adult physical and sexual abuse: Secondary | ICD-10-CM | POA: Diagnosis not present

## 2022-06-19 DIAGNOSIS — F411 Generalized anxiety disorder: Secondary | ICD-10-CM | POA: Diagnosis present

## 2022-06-19 DIAGNOSIS — F332 Major depressive disorder, recurrent severe without psychotic features: Principal | ICD-10-CM | POA: Diagnosis present

## 2022-06-19 DIAGNOSIS — R45851 Suicidal ideations: Secondary | ICD-10-CM | POA: Diagnosis present

## 2022-06-19 DIAGNOSIS — E440 Moderate protein-calorie malnutrition: Secondary | ICD-10-CM | POA: Diagnosis not present

## 2022-06-19 DIAGNOSIS — F1210 Cannabis abuse, uncomplicated: Secondary | ICD-10-CM | POA: Diagnosis present

## 2022-06-19 DIAGNOSIS — R569 Unspecified convulsions: Secondary | ICD-10-CM | POA: Diagnosis present

## 2022-06-19 DIAGNOSIS — F331 Major depressive disorder, recurrent, moderate: Secondary | ICD-10-CM | POA: Diagnosis not present

## 2022-06-19 DIAGNOSIS — Z96643 Presence of artificial hip joint, bilateral: Secondary | ICD-10-CM | POA: Diagnosis present

## 2022-06-19 DIAGNOSIS — Z818 Family history of other mental and behavioral disorders: Secondary | ICD-10-CM | POA: Diagnosis not present

## 2022-06-19 DIAGNOSIS — Z720 Tobacco use: Secondary | ICD-10-CM | POA: Diagnosis not present

## 2022-06-19 DIAGNOSIS — Z825 Family history of asthma and other chronic lower respiratory diseases: Secondary | ICD-10-CM | POA: Diagnosis not present

## 2022-06-19 DIAGNOSIS — F1020 Alcohol dependence, uncomplicated: Principal | ICD-10-CM

## 2022-06-19 DIAGNOSIS — Z9151 Personal history of suicidal behavior: Secondary | ICD-10-CM | POA: Diagnosis not present

## 2022-06-19 DIAGNOSIS — F10930 Alcohol use, unspecified with withdrawal, uncomplicated: Secondary | ICD-10-CM | POA: Diagnosis not present

## 2022-06-19 DIAGNOSIS — Z8249 Family history of ischemic heart disease and other diseases of the circulatory system: Secondary | ICD-10-CM

## 2022-06-19 DIAGNOSIS — Z91138 Patient's unintentional underdosing of medication regimen for other reason: Secondary | ICD-10-CM

## 2022-06-19 DIAGNOSIS — F10239 Alcohol dependence with withdrawal, unspecified: Secondary | ICD-10-CM | POA: Diagnosis not present

## 2022-06-19 DIAGNOSIS — D509 Iron deficiency anemia, unspecified: Secondary | ICD-10-CM | POA: Diagnosis not present

## 2022-06-19 LAB — SARS CORONAVIRUS 2 BY RT PCR: SARS Coronavirus 2 by RT PCR: NEGATIVE

## 2022-06-19 NOTE — H&P (Signed)
Behavioral Health Medical Screening Exam  Kelsey [name] is a 41 y.o. 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  Pt was seen at [facility] on 06/05/22 for AMS and Suicide attempt after taking 5-6 hydrocodone pills and unknown amount of Xanax that day". "Pt has a history of status epilepticus and an intermittent history of noncompliance with her Keppra".  On evaluation, the patient is alert, trembling, tearful, smells of alcohol, and has a slurred speech with difficulty finding her words. Patient reports she drinks 1/5th of Vodka daily consistently since January, this year, and last drank a 20 oz beer given by her friend on the way to the hospital. Pt endorses SI with a plan to overdose on her xanax pills "I was gonna overdose on xanax". " I took 2 pills and got scared 36 hours ago". Pt reports " I tried to commit suicide a few weeks ago, and I took some unknown opiates".  Pt denies HI, denies AVH. Pt reports " I wanted to come for help, Im a danger to myself". Pt denies access to weapons or presence of weapons at home. Pt reports she currently lives with her aunt because her spouse told her to leave and not return unless she got help/detox.  Pt reports she smokes 3-4 blunts of marijuana daily, and smokes less than half a pack of cigarettes/day. Pt denies other illicit drug use.   Pt was brought in by her friend [facility]), who gave collateral information with patient's consent. Ms [name] reports she was called to the patient's aunt's house after her husband kicked her out due to alcohol abuse. Ms [name] reports she has been friends with the patient for a long time, and felt  compelled to help. Ms [name] reports she had to bring the patient here because the patient reported she was suicidal with intent/plan to overdose on her pills, and also as a referral from Dr Lysbeth Penner, and Herself.  Ms [name] reports the patient has been drinking a lot lately, and has a history of withdrawal seizures.   Ms [name] reports she gave the patient  20 oz of beer to drink while on the way to the [facility]  to prevent seizures because she was concerned the patient might start seizing on the way.  Ms [name] reports that she works at [employer] and the facility , through her help, will take the patient after detox. Ms [name] reports that [facility] offers a 28 day in-patient rehab for substance use disorders and will help get the patient in as a resident if she is medically cleared.  Ms [name] reports that the patient was a resident at [facility] 5 years ago for substance abuse treatment.  On evaluation, patient is alert, oriented x 2, and cooperative. Speech is slurred. Pt appears disheveled. Eye contact is fair. Mood is anxious and depressed affect is congruent with mood. Thought process and thought content is coherent. Pt endorses SI with plan/intent, denies HI/AVH. There is no indication that the patient is responding to internal stimuli. No delusions elicited during this assessment.      Total Time spent with patient: 20 minutes  Psychiatric Specialty Exam:  Presentation  General Appearance: Disheveled  Eye Contact:Fair  Speech:Slurred  Speech Volume:Normal  Handedness:Right   Mood and Affect  Mood:Anxious; Depressed  Affect:Congruent   Thought Process  Thought Processes:Coherent  Descriptions of Associations:Intact  Orientation:Partial  Thought Content:WDL  History of Schizophrenia/Schizoaffective disorder:No  Duration of Psychotic Symptoms:No data recorded Hallucinations:Hallucinations: None  Ideas of Reference:None  Suicidal Thoughts:Suicidal Thoughts:  Yes, Active SI Active Intent and/or Plan: With Plan; With Intent  Homicidal Thoughts:Homicidal Thoughts: No   Sensorium  Memory:Immediate Poor  Judgment:Poor  Insight:Poor   Executive Functions  Concentration:Fair  Attention Span:Fair  Recall:Poor  Fund of Knowledge:Poor  Language:Fair   Psychomotor Activity  Psychomotor Activity:Psychomotor Activity: Tremor   Assets  Assets:Communication Skills; Desire for Improvement; Social Support; Housing   Sleep  Sleep:Sleep: Fair    Physical Exam: Physical Exam Constitutional:      Appearance: She is not toxic-appearing.  HENT:     Head: Normocephalic.     Right Ear: External ear normal.     Left Ear: External ear normal.     Nose: No congestion.  Eyes:     General:        Right eye: No discharge.        Left eye: No discharge.  Pulmonary:     Effort: No respiratory distress.  Chest:     Chest wall: No tenderness.  Neurological:     Mental Status: She is alert and oriented to person, place, and time.  Psychiatric:        Attention and Perception: Attention and perception normal.        Mood and Affect: Mood is anxious and depressed.        Speech: Speech is slurred.        Behavior: Behavior is cooperative.        Thought Content: Thought content is not paranoid or delusional. Thought content includes suicidal ideation. Thought content does not include homicidal ideation. Thought content includes suicidal plan. Thought content does not include homicidal plan.        Cognition and Memory: Memory is impaired.        Judgment: Judgment is impulsive.    Review of Systems  Constitutional:  Negative for chills, diaphoresis and fever.  HENT:  Negative for congestion.   Eyes:  Negative for discharge.  Respiratory:  Negative for cough, shortness of breath and wheezing.   Cardiovascular:  Negative for chest pain and palpitations.  Gastrointestinal:  Negative for diarrhea, nausea and vomiting.  Neurological:   Positive for tremors. Negative for dizziness, seizures, loss of consciousness, weakness and headaches.  Psychiatric/Behavioral:  Positive for depression, substance abuse and suicidal ideas. Negative for hallucinations. The patient is nervous/anxious. The patient does not have insomnia.     Musculoskeletal: Strength & Muscle Tone: within normal limits Gait & Station: unsteady Patient leans: N/A  [place] Scale:  Flowsheet Row [facility] from 06/05/2022 in [place] [facility]PART[facility] Admission (Discharged) from 05/05/2022 in [facility]  C-SSRS RISK CATEGORY High Risk High Risk No Risk       Recommendations:  Based on my evaluation the patient does not appear to have an emergency medical condition.  Patient recommended for in-patient psychiatric treatment for mood stabilization, safety monitoring, and substance abuse treatment at the [facility] pending negative Covid test.   Recommend CIWA protocol  Mancel Bale, NP 06/19/2022, 9:08 PM

## 2022-06-20 ENCOUNTER — Encounter (HOSPITAL_COMMUNITY): Payer: Self-pay | Admitting: Psychiatry

## 2022-06-20 ENCOUNTER — Other Ambulatory Visit: Payer: Self-pay

## 2022-06-20 DIAGNOSIS — R45851 Suicidal ideations: Secondary | ICD-10-CM

## 2022-06-20 DIAGNOSIS — F1020 Alcohol dependence, uncomplicated: Principal | ICD-10-CM | POA: Diagnosis present

## 2022-06-20 DIAGNOSIS — F1210 Cannabis abuse, uncomplicated: Secondary | ICD-10-CM

## 2022-06-20 DIAGNOSIS — F332 Major depressive disorder, recurrent severe without psychotic features: Principal | ICD-10-CM

## 2022-06-20 DIAGNOSIS — F17200 Nicotine dependence, unspecified, uncomplicated: Secondary | ICD-10-CM

## 2022-06-20 DIAGNOSIS — F10239 Alcohol dependence with withdrawal, unspecified: Secondary | ICD-10-CM

## 2022-06-20 LAB — CBC
HCT: 39.8 % (ref 36.0–46.0)
Hemoglobin: 12.9 g/dL (ref 12.0–15.0)
MCH: 32.9 pg (ref 26.0–34.0)
MCHC: 32.4 g/dL (ref 30.0–36.0)
MCV: 101.5 fL — ABNORMAL HIGH (ref 80.0–100.0)
Platelets: 217 10*3/uL (ref 150–400)
RBC: 3.92 MIL/uL (ref 3.87–5.11)
RDW: 17.8 % — ABNORMAL HIGH (ref 11.5–15.5)
WBC: 5.6 10*3/uL (ref 4.0–10.5)
nRBC: 0 % (ref 0.0–0.2)

## 2022-06-20 LAB — COMPREHENSIVE METABOLIC PANEL
ALT: 53 U/L — ABNORMAL HIGH (ref 0–44)
AST: 48 U/L — ABNORMAL HIGH (ref 15–41)
Albumin: 3.2 g/dL — ABNORMAL LOW (ref 3.5–5.0)
Alkaline Phosphatase: 100 U/L (ref 38–126)
Anion gap: 8 (ref 5–15)
BUN: 14 mg/dL (ref 6–20)
CO2: 27 mmol/L (ref 22–32)
Calcium: 8.6 mg/dL — ABNORMAL LOW (ref 8.9–10.3)
Chloride: 104 mmol/L (ref 98–111)
Creatinine, Ser: 0.65 mg/dL (ref 0.44–1.00)
GFR, Estimated: >60 mL/min (ref >60)
Glucose, Bld: 94 mg/dL (ref 70–99)
Potassium: 2.8 mmol/L — ABNORMAL LOW (ref 3.5–5.1)
Sodium: 139 mmol/L (ref 135–145)
Total Bilirubin: 0.4 mg/dL (ref 0.3–1.2)
Total Protein: 6.5 g/dL (ref 6.5–8.1)

## 2022-06-20 LAB — RAPID URINE DRUG SCREEN, HOSP PERFORMED
Amphetamines: NOT DETECTED
Barbiturates: NOT DETECTED
Benzodiazepines: POSITIVE — AB
Cocaine: NOT DETECTED
Opiates: NOT DETECTED
Tetrahydrocannabinol: POSITIVE — AB

## 2022-06-20 LAB — LIPID PANEL
Cholesterol: 145 mg/dL (ref 0–200)
HDL: 33 mg/dL — ABNORMAL LOW (ref >40)
LDL Cholesterol: 87 mg/dL (ref 0–99)
Total CHOL/HDL Ratio: 4.4 RATIO
Triglycerides: 127 mg/dL (ref <150)
VLDL: 25 mg/dL (ref 0–40)

## 2022-06-20 LAB — ETHANOL: Alcohol, Ethyl (B): <10 mg/dL (ref <10)

## 2022-06-20 LAB — TSH: TSH: 3.374 u[IU]/mL (ref 0.350–4.500)

## 2022-06-20 LAB — PREGNANCY, URINE: Preg Test, Ur: NEGATIVE

## 2022-06-20 MED ORDER — FLUOXETINE HCL 20 MG PO CAPS
20.0000 mg | ORAL_CAPSULE | Freq: Every day | ORAL | Status: DC
  Administered 2022-06-20: 20 mg via ORAL
  Filled 2022-06-20 (×3): qty 1

## 2022-06-20 MED ORDER — LORAZEPAM 1 MG PO TABS: 1.0000 mg | ORAL_TABLET | Freq: Three times a day (TID) | ORAL | Status: DC

## 2022-06-20 MED ORDER — ADULT MULTIVITAMIN W/MINERALS CH
1.0000 | ORAL_TABLET | Freq: Every day | ORAL | Status: DC
  Administered 2022-06-20 – 2022-06-23 (×4): 1 via ORAL
  Filled 2022-06-20 (×6): qty 1

## 2022-06-20 MED ORDER — CLONIDINE HCL 0.1 MG PO TABS
0.1000 mg | ORAL_TABLET | ORAL | Status: DC | PRN
  Administered 2022-06-21 – 2022-06-23 (×3): 0.1 mg via ORAL
  Filled 2022-06-20 (×3): qty 1

## 2022-06-20 MED ORDER — WHITE PETROLATUM EX OINT
TOPICAL_OINTMENT | CUTANEOUS | Status: AC
  Filled 2022-06-20: qty 5

## 2022-06-20 MED ORDER — HYDRALAZINE HCL 25 MG PO TABS
25.0000 mg | ORAL_TABLET | Freq: Three times a day (TID) | ORAL | Status: DC
  Administered 2022-06-20 (×2): 25 mg via ORAL
  Filled 2022-06-20 (×10): qty 1

## 2022-06-20 MED ORDER — LOPERAMIDE HCL 2 MG PO CAPS: 2.0000 mg | ORAL_CAPSULE | ORAL | Status: AC | PRN

## 2022-06-20 MED ORDER — THIAMINE HCL 100 MG PO TABS
100.0000 mg | ORAL_TABLET | Freq: Every day | ORAL | Status: DC
  Administered 2022-06-21 – 2022-06-23 (×3): 100 mg via ORAL
  Filled 2022-06-20 (×6): qty 1

## 2022-06-20 MED ORDER — CYCLOBENZAPRINE HCL 10 MG PO TABS
10.0000 mg | ORAL_TABLET | Freq: Three times a day (TID) | ORAL | Status: DC | PRN
  Administered 2022-06-20 – 2022-06-22 (×5): 10 mg via ORAL
  Filled 2022-06-20 (×5): qty 1

## 2022-06-20 MED ORDER — ENSURE ENLIVE PO LIQD
237.0000 mL | ORAL | Status: DC
  Filled 2022-06-20 (×5): qty 237

## 2022-06-20 MED ORDER — DIAZEPAM 5 MG PO TABS
5.0000 mg | ORAL_TABLET | Freq: Every day | ORAL | Status: AC
Start: 2022-06-20 — End: 2022-06-20
  Administered 2022-06-20: 5 mg via ORAL
  Filled 2022-06-20: qty 1

## 2022-06-20 MED ORDER — LORAZEPAM 1 MG PO TABS
1.0000 mg | ORAL_TABLET | Freq: Four times a day (QID) | ORAL | Status: AC | PRN
  Administered 2022-06-20 (×2): 1 mg via ORAL
  Filled 2022-06-20 (×3): qty 1

## 2022-06-20 MED ORDER — NICOTINE 14 MG/24HR TD PT24
14.0000 mg | MEDICATED_PATCH | Freq: Every day | TRANSDERMAL | Status: DC
  Administered 2022-06-20 – 2022-06-22 (×3): 14 mg via TRANSDERMAL
  Filled 2022-06-20 (×6): qty 1

## 2022-06-20 MED ORDER — LORAZEPAM 1 MG PO TABS
1.0000 mg | ORAL_TABLET | Freq: Four times a day (QID) | ORAL | Status: DC
  Administered 2022-06-20 (×2): 1 mg via ORAL
  Filled 2022-06-20 (×2): qty 1

## 2022-06-20 MED ORDER — GABAPENTIN 400 MG PO CAPS
800.0000 mg | ORAL_CAPSULE | Freq: Four times a day (QID) | ORAL | Status: AC
  Administered 2022-06-20 – 2022-06-22 (×12): 800 mg via ORAL
  Filled 2022-06-20 (×19): qty 2

## 2022-06-20 MED ORDER — LEVETIRACETAM 500 MG PO TABS
500.0000 mg | ORAL_TABLET | Freq: Two times a day (BID) | ORAL | Status: DC
  Administered 2022-06-20 – 2022-06-23 (×7): 500 mg via ORAL
  Filled 2022-06-20 (×11): qty 1

## 2022-06-20 MED ORDER — LORAZEPAM 2 MG/ML IJ SOLN
2.0000 mg | Freq: Two times a day (BID) | INTRAMUSCULAR | Status: DC | PRN
  Administered 2022-06-23: 2 mg via INTRAMUSCULAR
  Filled 2022-06-20 (×2): qty 1

## 2022-06-20 MED ORDER — MAGNESIUM HYDROXIDE 400 MG/5ML PO SUSP: 30.0000 mL | Freq: Every day | ORAL | Status: DC | PRN

## 2022-06-20 MED ORDER — ALUM & MAG HYDROXIDE-SIMETH 200-200-20 MG/5ML PO SUSP: 30.0000 mL | ORAL | Status: DC | PRN

## 2022-06-20 MED ORDER — LORAZEPAM 1 MG PO TABS: 1.0000 mg | ORAL_TABLET | Freq: Two times a day (BID) | ORAL | Status: DC

## 2022-06-20 MED ORDER — TRAZODONE HCL 100 MG PO TABS
50.0000 mg | ORAL_TABLET | Freq: Every evening | ORAL | Status: DC | PRN
  Administered 2022-06-20 – 2022-06-22 (×3): 50 mg via ORAL
  Filled 2022-06-20 (×3): qty 1

## 2022-06-20 MED ORDER — FLUOXETINE HCL 20 MG PO CAPS
60.0000 mg | ORAL_CAPSULE | Freq: Every day | ORAL | Status: DC
  Administered 2022-06-21 – 2022-06-23 (×3): 60 mg via ORAL
  Filled 2022-06-20 (×6): qty 3

## 2022-06-20 MED ORDER — ALBUTEROL SULFATE HFA 108 (90 BASE) MCG/ACT IN AERS
2.0000 | INHALATION_SPRAY | RESPIRATORY_TRACT | Status: DC | PRN
  Administered 2022-06-20 – 2022-06-23 (×11): 2 via RESPIRATORY_TRACT
  Filled 2022-06-20 (×2): qty 6.7

## 2022-06-20 MED ORDER — BOOST / RESOURCE BREEZE PO LIQD CUSTOM
1.0000 | ORAL | Status: DC
  Administered 2022-06-23: 1 via ORAL
  Filled 2022-06-20 (×5): qty 1

## 2022-06-20 MED ORDER — LORAZEPAM 1 MG PO TABS
2.0000 mg | ORAL_TABLET | Freq: Four times a day (QID) | ORAL | Status: DC
  Administered 2022-06-20 – 2022-06-21 (×5): 2 mg via ORAL
  Filled 2022-06-20 (×5): qty 2

## 2022-06-20 MED ORDER — ONDANSETRON 4 MG PO TBDP: 4.0000 mg | ORAL_TABLET | Freq: Four times a day (QID) | ORAL | Status: AC | PRN

## 2022-06-20 MED ORDER — HYDROXYZINE HCL 25 MG PO TABS
25.0000 mg | ORAL_TABLET | Freq: Four times a day (QID) | ORAL | Status: AC | PRN
  Administered 2022-06-20 – 2022-06-22 (×5): 25 mg via ORAL
  Filled 2022-06-20 (×5): qty 1

## 2022-06-20 MED ORDER — ACETAMINOPHEN 325 MG PO TABS: 650.0000 mg | ORAL_TABLET | Freq: Four times a day (QID) | ORAL | Status: DC | PRN

## 2022-06-20 MED ORDER — POTASSIUM CHLORIDE CRYS ER 20 MEQ PO TBCR
40.0000 meq | EXTENDED_RELEASE_TABLET | Freq: Two times a day (BID) | ORAL | Status: AC
  Administered 2022-06-20: 40 meq via ORAL
  Filled 2022-06-20 (×2): qty 2

## 2022-06-20 MED ORDER — LORAZEPAM 1 MG PO TABS: 1.0000 mg | ORAL_TABLET | Freq: Every day | ORAL | Status: DC

## 2022-06-20 MED ORDER — AMLODIPINE BESYLATE 5 MG PO TABS
5.0000 mg | ORAL_TABLET | Freq: Every day | ORAL | Status: DC
  Administered 2022-06-20 – 2022-06-21 (×2): 5 mg via ORAL
  Filled 2022-06-20 (×5): qty 1

## 2022-06-20 MED ORDER — THIAMINE HCL 100 MG/ML IJ SOLN: 100.0000 mg | Freq: Once | INTRAMUSCULAR | Status: DC
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
## 2022-06-20 NOTE — Group Note (Signed)
Recreation Therapy Group Note   Group Topic:Animal Assisted Therapy   Group Date: 06/20/2022 Start Time: 1430 End Time: 1510 Facilitators: Caroll Rancher, LRT,CTRS Location: [address] Dayroom   Animal-Assisted Activity (AAA) Program Checklist/Progress Notes Patient Eligibility Criteria Checklist & Daily Group note for Rec Tx Intervention  AAA/T Program Assumption of Risk Form signed by Patient/ or Parent Legal Guardian Yes  Patient understands his/her participation is voluntary Yes   Affect/Mood: N/A   Participation Level: Did not attend    Clinical Observations/Individualized Feedback:     Plan: Continue to engage patient in RT group sessions 2-3x/week.   Caroll Rancher, LRT,CTRS 06/20/2022 3:25 PM

## 2022-06-20 NOTE — Progress Notes (Signed)
   06/20/22 2322  Psych Admission Type (Psych Patients Only)  Admission Status Involuntary  Psychosocial Assessment  Patient Complaints Anxiety;Depression  Eye Contact Fair  Facial Expression Anxious  Affect Apprehensive;Anxious  Speech Logical/coherent  Teacher, music  Appearance/Hygiene Improved  Behavior Characteristics Cooperative  Mood Anxious  Thought Process  Coherency WDL  Content WDL  Delusions None reported or observed  Perception WDL  Hallucination None reported or observed  Judgment Impaired  Confusion None  Danger to Self  Current suicidal ideation? Denies  Self-Injurious Behavior No self-injurious ideation or behavior indicators observed or expressed   Agreement Not to Harm Self Yes  Description of Agreement verbal contract for safety  Danger to Others  Danger to Others None reported or observed   D: Patient alert and cooperative interacting well with peers.  Pt detoxing from EOTH and c/o anxiety and tremulous. Pt mood/affect is anxious and sad. Pt denies SI/HI/AVH. No acute distressed noted at this time.  A: Medications administered as prescribed. Emotional support given and will continue to monitor pt's progress for stabilization. R: Patient remains safe and complaint with medications.

## 2022-06-20 NOTE — Progress Notes (Signed)
Pt denies HI/AVH but endorses SI with a plan to OD on xanax and drink alcohol but verbally agrees to approach staff if these become apparent or before harming themselves/others. Rates depression 10/10. Rates anxiety 8/10. Rates pain 0/10. Pt is currently withdrawing and pt reports PRNs have been helpful. Pt cried at one point this morning due to "everything and my belongings being lost." Pt has been seen socializing more in the afternoon. Pt belongings have been found in another locker. Pt was very happy to hear that. Pt has been having w/d symptoms throughout the day. Pt stated she feels she has been in a better mood the rest of the day and stated she no longer has SI currently. Scheduled medications administered to pt, per MD orders. RN provided support and encouragement to pt. Q15 min safety checks implemented and continued. Pt safe on the unit. RN will continue to monitor and intervene as needed.   06/20/22 0821  Psych Admission Type (Psych Patients Only)  Admission Status Involuntary  Psychosocial Assessment  Patient Complaints Crying spells;Anxiety;Depression;Self-harm thoughts  Eye Contact Fair  Facial Expression Anxious;Sad;Worried  Affect Anxious;Depressed;Sad  Speech Logical/coherent;Soft  Interaction Assertive  Motor Activity Slow  Appearance/Hygiene Improved  Behavior Characteristics Cooperative;Appropriate to situation;Anxious  Mood Depressed;Anxious;Sad;Pleasant  Thought Process  Coherency WDL  Content Blaming others;Blaming self  Delusions None reported or observed  Perception WDL  Hallucination None reported or observed  Judgment Impaired  Confusion None  Danger to Self  Current suicidal ideation? Passive  Description of Suicide Plan same as admission  Self-Injurious Behavior No self-injurious ideation or behavior indicators observed or expressed   Agreement Not to Harm Self Yes  Description of Agreement verbal  Danger to Others  Danger to Others None reported or  observed

## 2022-06-20 NOTE — Progress Notes (Signed)
NUTRITION ASSESSMENT RD working remotely.   Pt identified as at risk on the Malnutrition Screen Tool  INTERVENTION: - will order Boost Breeze once/day, each supplement provides 250 kcal and 9 grams of protein.  - will order Ensure Plus High Protein once/day, each supplement provides 350 kcal and 20 grams of protein.  - will order 1 tablet multivitamin with minerals/day.  [facility] staff to continue to encourage PO intakes of meals, supplements, and snacks.    NUTRITION DIAGNOSIS: Unintentional weight loss related to sub-optimal intake as evidenced by pt report.   Goal: Pt to meet >/= 90% of their estimated nutrition needs.  Monitor:  PO intake  Assessment:  Patient with hx of alcohol abuse (1/5 liquor/day), opioid abuse, benzo abuse, marijuana use, and suicidal ideations.   Patient presented to the ED due to depression and suicidal ideation with a plan to OD on xanax.  Weight today is documented as 140 lb which appears to be a stated weight. Weight on 7/10 was 144 lb, weight on 6/9 was 150 lb, and weight on 3/24 was 143 lb.  This indicates 10 lb weight loss (6.7% body weight) in the past 5 weeks.    41 y.o. female  Height: Ht Readings from Last 1 Encounters:  06/20/22 5\' 5"  (1.651 m)    Weight: Wt Readings from Last 1 Encounters:  06/20/22 63.5 kg    Weight Hx: Wt Readings from Last 10 Encounters:  06/20/22 63.5 kg  06/05/22 65.3 kg  05/05/22 68 kg  02/17/22 65 kg  02/16/22 65.8 kg  01/26/22 70.3 kg  01/18/22 71.1 kg  12/20/21 71.4 kg  12/13/21 73.5 kg  12/06/21 73.5 kg    BMI:  Body mass index is 23.3 kg/m. Pt meets criteria for normal weight based on current BMI.  Estimated Nutritional Needs: Kcal: 25-30 kcal/kg Protein: > 1 gram protein/kg Fluid: 1 ml/kcal  Diet Order:  Diet Order             Diet regular Room service appropriate? Yes; Fluid consistency: Thin  Diet effective now                  Pt is also offered choice of unit snacks  mid-morning and mid-afternoon.  Pt is eating as desired.   Lab results and medications reviewed.      02/03/22, MS, RD, LDN, CNSC Registered Dietitian II Inpatient Clinical Nutrition RD pager # and on-call/weekend pager # available in [facility]

## 2022-06-20 NOTE — H&P (Signed)
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 treatment and Medication management  Physician Treatment Plan for Primary Diagnosis: Alcohol use disorder, severe, dependence (HCC) Long Term Goal(s): Improvement in symptoms so as ready for discharge  Short Term Goals: Ability to identify changes in lifestyle to reduce recurrence of condition will improve, Ability to verbalize feelings will improve, Ability to disclose and discuss suicidal ideas, Ability to demonstrate self-control will improve, Ability to identify and develop effective coping behaviors will improve,  Ability to maintain clinical measurements within normal limits will improve, Compliance with prescribed medications will improve, and Ability to identify triggers associated with substance abuse/mental health issues will improve  Physician Treatment Plan for Secondary Diagnosis: Principal Problem:   Alcohol use disorder, severe, dependence (HCC) Active Problems:   MDD (major depressive disorder), recurrent severe, without psychosis (HCC)   Tobacco use disorder   Cannabis abuse  Long Term Goal(s): Improvement in symptoms so as ready for discharge  Short Term Goals: Ability to identify changes in lifestyle to reduce recurrence of condition will improve, Ability to verbalize feelings will improve, Ability to disclose and discuss suicidal ideas, Ability to demonstrate self-control will improve, Ability to identify and develop effective coping behaviors will improve, Ability to maintain clinical measurements within normal limits will improve, Compliance with prescribed medications will improve, and Ability to identify triggers associated with substance abuse/mental health issues will improve  I certify that inpatient services furnished can reasonably be expected to improve the patient's condition.      ASSESSMENT:  Diagnoses / Active Problems: -Major depressive disorder, recurrent severe, without psychosis Generalized anxiety disorder - Alcohol use disorder, severe, dependence - Tobacco use disorder - Cannabis abuse  PLAN: Safety and Monitoring:  -- Voluntary admission to inpatient psychiatric unit for safety, stabilization and treatment  -- Daily contact with patient to assess and evaluate symptoms and progress in treatment  -- Patient's case to be discussed in  multi-disciplinary team meeting  -- Observation Level : q15 minute checks  -- Vital signs:  q12 hours  -- Precautions: suicide, elopement, and assault  2. Psychiatric Diagnoses and Treatment:  -Start Prozac 60 mg once a day,  for depression and anxiety -- The risks/benefits/side-effects/alternatives to this medication were discussed in detail with the patient and time was given for questions. The patient consents to medication trial.   -- Metabolic profile and EKG monitoring obtained while on an atypical antipsychotic (BMI: Lipid Panel: HbgA1c: QTc:)   -- Encouraged patient to participate in unit milieu and in scheduled group therapies   -- Short Term Goals: Ability to identify changes in lifestyle to reduce recurrence of condition will improve, Ability to verbalize feelings will improve, Ability to disclose and discuss suicidal ideas, Ability to demonstrate self-control will improve, Ability to identify and develop effective coping behaviors will improve, Ability to maintain clinical measurements within normal limits will improve, Compliance with prescribed medications will improve, and Ability to identify triggers associated with substance abuse/mental health issues will improve  -- Long Term Goals: Improvement in symptoms so as ready for discharge    3. Medical Issues Being Addressed:   Tobacco Use Disorder  -- Nicotine patch 21mg /24 hours ordered  -- Smoking cessation encouraged Withdrawals/Seizures - Start CIWA protocol - Start Ativan 1 mg as needed every 2 hours - Start Ativan 2 mg every 6 hours - Start gabapentin 800 milligrams 4 times daily - Start Valium 5 mg for 1 dose - Start Keppra 500 mg twice daily - Start Norvasc 5 mg daily  Hypertension - Start clonidine 0.1 mg every 4 hours as needed, give 1 dose if systolic BP is over 180 and/or diastolic BP is over 100.  Hypokalemia - (06/20/2022) 2.8 - Order potassium chloride tablet 40 mEq 2 times daily for 1 dose - Order repeat CMP for tomorrow morning  4. Discharge Planning:   -- Social work and case management to assist with discharge planning and identification of hospital follow-up needs prior to discharge  -- Estimated LOS: 5-7 days  -- Discharge  Concerns: Need to establish a safety plan; Medication compliance and effectiveness  -- Discharge Goals: Return home with outpatient referrals for mental health follow-up including medication management/psychotherapy    Lorri FrederickMargely Carrion-Carrero, MD 7/25/20235:51 PM

## 2022-06-20 NOTE — Group Note (Signed)
Date:  06/20/2022 Time:  10:47 AM  Group Topic/Focus:  Orientation:   The focus of this group is to educate the patient on the purpose and policies of crisis stabilization and provide a format to answer questions about their admission.  The group details unit policies and expectations of patients while admitted.    Participation Level:  Active  Participation Quality:  Appropriate  Affect:  Appropriate  Cognitive:  Appropriate  Insight: Appropriate  Engagement in Group:  Engaged  Modes of Intervention:  Discussion  Additional Comments:     Reymundo Poll 06/20/2022, 10:47 AM
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
## 2022-06-20 NOTE — [facility] Counselor (Signed)
Per patient request, CSW notified chaplain that patient wants to speak with her.    Dajia Gunnels, LCSW, LCAS Clincal Social Worker  [facility]

## 2022-06-20 NOTE — [facility] Suicide Risk Assessment (Signed)
[facility] INPATIENT:  Family/Significant Other Suicide Prevention Education  Suicide Prevention Education:  Education Completed; [name], friend,  [phone] (name of family member/significant other) has been identified by the patient as the family member/significant other with whom the patient will be residing, and identified as the person(s) who will aid the patient in the event of a mental health crisis (suicidal ideations/suicide attempt).  With written consent from the patient, the family member/significant other has been provided the following suicide prevention education, prior to the and/or following the discharge of the patient.  CSW spoke to patient friend who has no additional safety concerns at this time.  Patient [facility] has [facility]  They will need some information from [place] when she is stable and not endorsing suicide. Patient has been fighting more with husband and living with her aunt.  When she is withdrawing she gets more suicidal.  Support doesn't feel like she would do anything but she gets more depressed and hopeless.  Support reports no access to guns weapons but concern for overdose on medications.   The suicide prevention education provided includes the following: Suicide risk factors Suicide prevention and interventions National Suicide Hotline telephone number [facility] assessment telephone number [facility] Emergency Assistance 911 [facility] and/or Residential Mobile Crisis Unit telephone number  Request made of family/significant other to: Remove weapons (e.g., guns, rifles, knives), all items previously/currently identified as safety concern.   Remove drugs/medications (over-the-counter, prescriptions, illicit drugs), all items previously/currently identified as a safety concern.  The family member/significant other verbalizes understanding of the suicide prevention education information provided.  The  family member/significant other agrees to remove the items of safety concern listed above.  Aarit Kashuba E Allysson Rinehimer 06/20/2022, 4:43 PM

## 2022-06-20 NOTE — Progress Notes (Signed)
The patient verbalized that she had a bad morning, but is now having a better evening. She rated her day as a 8 out of 10. Her positive event for the day is that she was visited by her husband.

## 2022-06-21 ENCOUNTER — Encounter (HOSPITAL_COMMUNITY): Payer: Self-pay | Admitting: Psychiatry

## 2022-06-21 ENCOUNTER — Encounter (HOSPITAL_COMMUNITY): Payer: Self-pay

## 2022-06-21 LAB — COMPREHENSIVE METABOLIC PANEL
ALT: 45 U/L — ABNORMAL HIGH (ref 0–44)
AST: 37 U/L (ref 15–41)
Albumin: 3.4 g/dL — ABNORMAL LOW (ref 3.5–5.0)
Alkaline Phosphatase: 106 U/L (ref 38–126)
Anion gap: 8 (ref 5–15)
BUN: 10 mg/dL (ref 6–20)
CO2: 25 mmol/L (ref 22–32)
Calcium: 8.8 mg/dL — ABNORMAL LOW (ref 8.9–10.3)
Chloride: 107 mmol/L (ref 98–111)
Creatinine, Ser: 0.63 mg/dL (ref 0.44–1.00)
GFR, Estimated: >60 mL/min (ref >60)
Glucose, Bld: 93 mg/dL (ref 70–99)
Potassium: 3.3 mmol/L — ABNORMAL LOW (ref 3.5–5.1)
Sodium: 140 mmol/L (ref 135–145)
Total Bilirubin: 0.3 mg/dL (ref 0.3–1.2)
Total Protein: 6.8 g/dL (ref 6.5–8.1)

## 2022-06-21 MED ORDER — LORAZEPAM 1 MG PO TABS
1.5000 mg | ORAL_TABLET | Freq: Four times a day (QID) | ORAL | Status: DC
  Administered 2022-06-22 (×3): 1.5 mg via ORAL
  Filled 2022-06-21 (×3): qty 1

## 2022-06-21 MED ORDER — POTASSIUM CHLORIDE CRYS ER 20 MEQ PO TBCR
40.0000 meq | EXTENDED_RELEASE_TABLET | Freq: Once | ORAL | Status: AC
Start: 2022-06-21 — End: 2022-06-21
  Administered 2022-06-21: 40 meq via ORAL
  Filled 2022-06-21: qty 2

## 2022-06-21 MED ORDER — AMLODIPINE BESYLATE 5 MG PO TABS
10.0000 mg | ORAL_TABLET | Freq: Every day | ORAL | Status: DC
Start: 2022-06-22 — End: 2022-06-23
  Administered 2022-06-22 – 2022-06-23 (×2): 10 mg via ORAL
  Filled 2022-06-21 (×4): qty 1

## 2022-06-21 NOTE — Progress Notes (Signed)
Pt denies SI/HI/AVH and verbally agrees to approach staff if these become apparent or before harming themselves/others. Rates depression 0/10. Rates anxiety 6/10. Rates pain 7/10 in right leg. Pt has been in the dayroom for the most of the day and interacting with others. Pt stated that she was a lot happier this morning after seeing her husband last night. Pt is still having significant tremors and some other w/d symptoms. Pt is improving daily and said that [facility]. Scheduled medications administered to pt, per MD orders. RN provided support and encouragement to pt. Q15 min safety checks implemented and continued. Pt safe on the unit. RN will continue to monitor and intervene as needed.   06/21/22 0823  Psych Admission Type (Psych Patients Only)  Admission Status Involuntary  Psychosocial Assessment  Patient Complaints Anxiety  Eye Contact Fair  Facial Expression Animated;Anxious  Affect Anxious;Apprehensive  Speech Logical/coherent  Interaction Assertive  Motor Activity Tremors;Slow;Fidgety  Appearance/Hygiene Improved  Behavior Characteristics Cooperative;Appropriate to situation;Anxious  Mood Anxious;Pleasant  Thought Process  Coherency WDL  Content WDL  Delusions None reported or observed  Perception WDL  Hallucination None reported or observed  Judgment Impaired  Confusion None  Danger to Self  Current suicidal ideation? Denies  Danger to Others  Danger to Others None reported or observed

## 2022-06-21 NOTE — Group Note (Signed)
LCSW Group Therapy Note   Group Date: 06/21/2022 Start Time: 1300 End Time: 1400  Type of Therapy and Topic: Group Packets; Self-Esteem   Participation Level:  Active  Due to the acuity and complex discharge plans, group was not held. Patient was provided therapeutic worksheets and asked to meet with CSW as needed.  Tonnia Bardin M Aisa Schoeppner, LCSWA 06/21/2022  1:21 PM    

## 2022-06-21 NOTE — Progress Notes (Signed)
Pt attended group,and actively participated. 

## 2022-06-21 NOTE — Group Note (Signed)
Recreation Therapy Group Note   Group Topic:Stress Management  Group Date: 06/21/2022 Start Time: 0935 End Time: 0955 Facilitators: Caroll Rancher, LRT,CTRS Location: [address] Dayroom   Goal Area(s) Addresses:  Patient will actively participate in stress management techniques presented during session.  Patient will successfully identify benefit of practicing stress management post d/c.   Group Description: Guided Imagery. LRT provided education, instruction, and demonstration on practice of visualization via guided imagery. Patient was asked to participate in the technique introduced during session. LRT debriefed including topics of mindfulness, stress management and specific scenarios each patient could use these techniques. Patients were given suggestions of ways to access scripts post d/c and encouraged to explore Youtube and other apps available on smartphones, tablets, and computers.   Affect/Mood: N/A   Participation Level: Did not attend    Clinical Observations/Individualized Feedback:     Plan: Continue to engage patient in RT group sessions 2-3x/week.   Caroll Rancher, LRT,CTRS  06/21/2022 11:54 AM

## 2022-06-21 NOTE — Progress Notes (Addendum)
Chaplain received a referral from social work to see [name].  Chaplain met with Kelsey [name] and provided support as she shared about how much she had lost due to her alcohol use. She feels like she has lost everything and that she has hit "rock bottom."  She felt that she had already been there 5 years ago when she lost custody of her kids.  She really felt that her life got back on track after she became sober and met her husband.  One drink brought her back to regular alcohol use.  She is grieving over the potential loss of her marriage, the loss of relationship with her children as well as other family members.  She knows that she needs to get better for herself and for her family.  She was very tearful during our conversation and appeared to be shaking.  Chaplain provided reflective listening as well as emotional support and prayer, at [facility] request.  Chaplain Janne Napoleon, [place] Pager, [phone]

## 2022-06-21 NOTE — Progress Notes (Signed)
[facility] MD Progress Note  06/21/2022 5:33 PM Kelsey [name]  MRN:  [id number] Principal Problem: Alcohol use disorder, severe, dependence (HCC) Diagnosis: Principal Problem:   Alcohol use disorder, severe, dependence (HCC) Active Problems:   MDD (major depressive disorder), recurrent severe, without psychosis (HCC)   Tobacco use disorder   Cannabis abuse  Patient is a 41 year old female with a past psychiatric history of alcohol use disorder with withdrawal seizures, major depressive disorder who was voluntarily admitted to [facility] on 06/20/22  due to worsening depression and suicidal ideation with a plan to overdose on Xanax and alcohol.  Patient had a suicide attempt 15 days ago by taking a handful of hydrocodone pills and Xanax.   Yesterday the following recommendations were made: -Start Prozac 60 mg once a day, for depression and anxiety  For Withdrawals and Seizures - Start CIWA protocol - Start Ativan 1 mg as needed every 2 hours - Start Ativan 2 mg every 6 hours - Start gabapentin 800 milligrams 4 times daily - Start Valium 5 mg for 1 dose - Start Keppra 500 mg twice daily - Start Norvasc 5 mg daily   Interval history: As needed medications, last 24 hours: -- Albuterol (3X) -- Flexeril (1X) -- Ativan (2X) -- Atarax (2X) -- Trazodone (1X)  On assessment today, the patient was evaluated outside of her room. She reports that her mood has improved today, describing it as "hopeful." Patient reports sleeping well. She endorses an adequate appetite. She continues to endorse poor concentration but overall reports that her depressive symptoms have improved. She denies any passive or active suicidal ideation, contacts for safety on the unit. The patient reports she is less anxious today and rates it 0 out of 10 (10 being the worst). She denies experiencing any panic attacks since the last encounter. She endorses tremors due to her withdrawal but notes that they  have been present at baseline when she is not drinking. On assessment today, the patient denies cravings. She denies agitation, irritability, or restlessness related to her withdrawal. She denies any changes in perceptions since yesterday. Denies having experienced any prodromal symptoms of seizures. The patient reports having contacted her husband, sharing that the conversation went well, and that there are talks of a possible reconciliation.  On assessment today, the patient denies homicidal ideation. She denies auditory or visual hallucinations. She denies all first-rank symptoms. There is no apparent paranoid thought process. There are no apparent delusional thought processes.  Patient denies any side effects of currently prescribed psychiatric medication. She denies any somatic symptoms.  Patient's CIWA score at the time of this evaluation was 4.  Past Psychiatric History:  Previous Psych Diagnoses: Major depressive disorder (diagnosed at age 24) Prior inpatient treatment: [facility] (8-9 years ago for depression) Prior rehab hx: [facility] (2018), patient remains over a year following her rehab treatment there. History of suicide: Patient attempted suicide by overdose with Xanax and alcohol 15 days prior to admission (7/10/223). Psychiatric medication compliance history: Compliant Neuromodulation history: Denies Current Psychiatrist/Outpatient Provider: Dr. Evelene Croon, private practice Current therapist: Denies  Past Medical History:  Past Medical History:  Diagnosis Date   Anxiety    Asthma    hosp. 2018 and 2019 no h/o intubation    Depression    GERD (gastroesophageal reflux disease)    Hypertension    Palpitations    Perianal abscess 11/22/2016   12/07/2016.  [facility], [place].  Ovidio Kin, MD.  Perianal abscess is  much better but still needs local wound care.  Continue sitz baths 2-3 times perday until all drainage and pain are gone.  Follow up as needed.   Preop  general physical exam 01/26/2022   PVC (premature ventricular contraction)    Seasonal allergies    Seizures (HCC)    01/15/22 was last seizure   Tachycardia     Past Surgical History:  Procedure Laterality Date   CESAREAN SECTION  10/15/2009, 02/08/2006   INCISION AND DRAINAGE PERIRECTAL ABSCESS Bilateral 11/22/2016   Procedure: IRRIGATION AND DEBRIDEMENT PERIRECTAL ABSCESS;  Surgeon: Ovidio Kin, MD;  Location: [facility];  Service: General;  Laterality: Bilateral;   INTRAUTERINE DEVICE INSERTION     Mirena 12/24/19 exp 12/28/21 Dr Philip Aspen [facility]   TOTAL HIP ARTHROPLASTY Left 02/17/2022   Procedure: Left TOTAL HIP ARTHROPLASTY ANTERIOR APPROACH;  Surgeon: Kathryne Hitch, MD;  Location: [facility];  Service: Orthopedics;  Laterality: Left;   TOTAL HIP ARTHROPLASTY Right 05/05/2022   Procedure: RIGHT TOTAL HIP ARTHROPLASTY ANTERIOR APPROACH;  Surgeon: Kathryne Hitch, MD;  Location: [facility];  Service: Orthopedics;  Laterality: Right;   Family History:  Family History  Problem Relation Age of Onset   Rheumatologic disease Mother    Arthritis Mother    Asthma Mother    Depression Mother    Heart disease Father    Asthma Maternal Grandmother    Hypertension Maternal Grandmother    Heart disease Maternal Grandfather    Lung cancer Paternal Grandmother    Healthy Daughter    Healthy Son    Family Psychiatric  History:  Mother Dx: MDD  Social History:  Social History   Substance and Sexual Activity  Alcohol Use Yes     Social History   Substance and Sexual Activity  Drug Use Yes   Types: Marijuana   Comment: yesterday    Social History   Socioeconomic History   Marital status: Married    Spouse name: Not on file   Number of children: 2   Years of education: Not on file   Highest education level: Not on file  Occupational History   Occupation: respiratory therapists    Employer: [employer]  Tobacco Use   Smoking status: Every Day     Packs/day: 0.25    Years: 10.00    Total pack years: 2.50    Types: Cigarettes   Smokeless tobacco: Never  Vaping Use   Vaping Use: Never used  Substance and Sexual Activity   Alcohol use: Yes   Drug use: Yes    Types: Marijuana    Comment: yesterday   Sexual activity: Not on file  Other Topics Concern   Not on file  Social History Narrative   Respiratory Therapist, 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    College ed    2 kids    Divorced but remarried since 05/2019 new last name will be Chestine Spore   Wears seat belt, feels safe in relationship    Social Determinants of Corporate investment banker Strain: Not on file  Food Insecurity: Not on file  Transportation Needs: Not on file  Physical Activity: Not on file  Stress: Not on file  Social Connections: Not on file   Additional Social History:    Sleep: Good  Appetite:  Good  Current Medications: Current Facility-Administered Medications  Medication Dose Route Frequency Provider Last Rate Last Admin   acetaminophen (TYLENOL) tablet 650 mg  650 mg Oral Q6H PRN Onuoha, Chinwendu V,  NP       albuterol (VENTOLIN HFA) 108 (90 Base) MCG/ACT inhaler 2 puff  2 puff Inhalation Q4H PRN Onuoha, Chinwendu V, NP   2 puff at 06/21/22 1606   alum & mag hydroxide-simeth (MAALOX/MYLANTA) 200-200-20 MG/5ML suspension 30 mL  30 mL Oral Q4H PRN Onuoha, Chinwendu V, NP       amLODipine (NORVASC) tablet 5 mg  5 mg Oral Daily Massengill, Nathan, MD   5 mg at 06/21/22 4315   cloNIDine (CATAPRES) tablet 0.1 mg  0.1 mg Oral Q4H PRN Massengill, Harrold Donath, MD   0.1 mg at 06/21/22 4008   cyclobenzaprine (FLEXERIL) tablet 10 mg  10 mg Oral TID PRN Onuoha, Chinwendu V, NP   10 mg at 06/21/22 1608   feeding supplement (BOOST / RESOURCE BREEZE) liquid 1 Container  1 Container Oral Q24H Massengill, Nathan, MD       feeding supplement (ENSURE ENLIVE / ENSURE PLUS) liquid 237 mL  237 mL Oral Q24H Massengill, Nathan, MD       FLUoxetine (PROZAC) capsule 60 mg  60 mg Oral  Daily Massengill, Harrold Donath, MD   60 mg at 06/21/22 6761   gabapentin (NEURONTIN) capsule 800 mg  800 mg Oral QID Massengill, Harrold Donath, MD   800 mg at 06/21/22 1609   hydrOXYzine (ATARAX) tablet 25 mg  25 mg Oral Q6H PRN Onuoha, Chinwendu V, NP   25 mg at 06/21/22 9509   levETIRAcetam (KEPPRA) tablet 500 mg  500 mg Oral BID Onuoha, Chinwendu V, NP   500 mg at 06/21/22 1609   loperamide (IMODIUM) capsule 2-4 mg  2-4 mg Oral PRN Onuoha, Chinwendu V, NP       LORazepam (ATIVAN) injection 2 mg  2 mg Intramuscular BID PRN Massengill, Harrold Donath, MD       LORazepam (ATIVAN) tablet 1 mg  1 mg Oral Q6H PRN Onuoha, Chinwendu V, NP   1 mg at 06/20/22 0856   LORazepam (ATIVAN) tablet 2 mg  2 mg Oral Q6H Massengill, Nathan, MD   2 mg at 06/21/22 1732   magnesium hydroxide (MILK OF MAGNESIA) suspension 30 mL  30 mL Oral Daily PRN Onuoha, Chinwendu V, NP       multivitamin with minerals tablet 1 tablet  1 tablet Oral Daily Onuoha, Chinwendu V, NP   1 tablet at 06/21/22 3267   nicotine (NICODERM CQ - dosed in mg/24 hours) patch 14 mg  14 mg Transdermal Q0600 Onuoha, Chinwendu V, NP   14 mg at 06/21/22 0825   ondansetron (ZOFRAN-ODT) disintegrating tablet 4 mg  4 mg Oral Q6H PRN Onuoha, Chinwendu V, NP       thiamine (B-1) injection 100 mg  100 mg Intramuscular Once Onuoha, Chinwendu V, NP       thiamine tablet 100 mg  100 mg Oral Daily Onuoha, Chinwendu V, NP   100 mg at 06/21/22 1245   traZODone (DESYREL) tablet 50 mg  50 mg Oral QHS PRN Massengill, Harrold Donath, MD   50 mg at 06/20/22 2133    Lab Results:  Results for orders placed or performed during the hospital encounter of 06/19/22 (from the past 48 hour(s))  SARS Coronavirus 2 by RT PCR (hospital order, performed in [facility] hospital lab) *cepheid single result test* Anterior Nasal Swab     Status: None   Collection Time: 06/19/22  9:07 PM   Specimen: Anterior Nasal Swab  Result Value Ref Range   SARS Coronavirus 2 by RT PCR NEGATIVE NEGATIVE  Comment:  (NOTE) SARS-CoV-2 target nucleic acids are NOT DETECTED.  The SARS-CoV-2 RNA is generally detectable in upper and lower respiratory specimens during the acute phase of infection. The lowest concentration of SARS-CoV-2 viral copies this assay can detect is 250 copies / mL. A negative result does not preclude SARS-CoV-2 infection and should not be used as the sole basis for treatment or other patient management decisions.  A negative result may occur with improper specimen collection / handling, submission of specimen other than nasopharyngeal swab, presence of viral mutation(s) within the areas targeted by this assay, and inadequate number of viral copies (<250 copies / mL). A negative result must be combined with clinical observations, patient history, and epidemiological information.  Fact Sheet for Patients:   RoadLapTop.co.zahttps://www.fda.gov/media/158405/download  Fact Sheet for Healthcare Providers: http://kim-miller.com/https://www.fda.gov/media/158404/download  This test is not yet approved or  cleared by the [place] FDA and has been authorized for detection and/or diagnosis of SARS-CoV-2 by FDA under an Emergency Use Authorization (EUA).  This EUA will remain in effect (meaning this test can be used) for the duration of the COVID-19 declaration under Section 564(b)(1) of the Act, 21 U.S.C. section 360bbb-3(b)(1), unless the authorization is terminated or revoked sooner.  Performed at [facility], [address] [place], [place] [address]   CBC     Status: Abnormal   Collection Time: 06/20/22  6:42 AM  Result Value Ref Range   WBC 5.6 4.0 - 10.5 K/uL   RBC 3.92 3.87 - 5.11 MIL/uL   Hemoglobin 12.9 12.0 - 15.0 g/dL   HCT 60.439.8 54.036.0 - 98.146.0 %   MCV 101.5 (H) 80.0 - 100.0 fL   MCH 32.9 26.0 - 34.0 pg   MCHC 32.4 30.0 - 36.0 g/dL   RDW 19.117.8 (H) 47.811.5 - 29.515.5 %   Platelets 217 150 - 400 K/uL   nRBC 0.0 0.0 - 0.2 %    Comment: Performed at [facility], [address] [place], [place] [address]  Comprehensive metabolic panel     Status: Abnormal   Collection Time: 06/20/22  6:42 AM  Result Value Ref Range   Sodium 139 135 - 145 mmol/L   Potassium 2.8 (L) 3.5 - 5.1 mmol/L   Chloride 104 98 - 111 mmol/L   CO2 27 22 - 32 mmol/L   Glucose, Bld 94 70 - 99 mg/dL    Comment: Glucose reference range applies only to samples taken after fasting for at least 8 hours.   BUN 14 6 - 20 mg/dL   Creatinine, Ser 8.650.65 0.44 - 1.00 mg/dL   Calcium 8.6 (L) 8.9 - 10.3 mg/dL   Total Protein 6.5 6.5 - 8.1 g/dL   Albumin 3.2 (L) 3.5 - 5.0 g/dL   AST 48 (H) 15 - 41 U/L   ALT 53 (H) 0 - 44 U/L   Alkaline Phosphatase 100 38 - 126 U/L   Total Bilirubin 0.4 0.3 - 1.2 mg/dL   GFR, Estimated >78>60 >46>60 mL/min    Comment: (NOTE) Calculated using the CKD-EPI Creatinine Equation (2021)    Anion gap 8 5 - 15    Comment: Performed at [facility], [address] [place], [place] [address]  Ethanol     Status: None   Collection Time: 06/20/22  6:42 AM  Result Value Ref Range   Alcohol, Ethyl (B) <10 <10 mg/dL    Comment: (NOTE) Lowest detectable limit for serum alcohol is 10 mg/dL.  For medical  purposes only. Performed at [facility], [address] [place], [place] [address]   Lipid panel     Status: Abnormal   Collection Time: 06/20/22  6:42 AM  Result Value Ref Range   Cholesterol 145 0 - 200 mg/dL   Triglycerides 604 <540 mg/dL   HDL 33 (L) >98 mg/dL   Total CHOL/HDL Ratio 4.4 RATIO   VLDL 25 0 - 40 mg/dL   LDL Cholesterol 87 0 - 99 mg/dL    Comment:        Total Cholesterol/HDL:CHD Risk Coronary Heart Disease Risk Table                     Men   Women  1/2 Average Risk   3.4   3.3  Average Risk       5.0   4.4  2 X Average Risk   9.6   7.1  3 X Average Risk  23.4   11.0        Use the calculated Patient Ratio above and the CHD Risk Table to determine the patient's CHD Risk.        ATP III CLASSIFICATION  (LDL):  <100     mg/dL   Optimal  119-147  mg/dL   Near or Above                    Optimal  130-159  mg/dL   Borderline  829-562  mg/dL   High  >130     mg/dL   Very High Performed at [facility], [address] [place], [place] [address]   TSH     Status: None   Collection Time: 06/20/22  6:42 AM  Result Value Ref Range   TSH 3.374 0.350 - 4.500 uIU/mL    Comment: Performed by a 3rd Generation assay with a functional sensitivity of <=0.01 uIU/mL. Performed at [facility], [address], [place], [place] [address]   Pregnancy, urine     Status: None   Collection Time: 06/20/22  9:42 AM  Result Value Ref Range   Preg Test, Ur NEGATIVE NEGATIVE    Comment:        THE SENSITIVITY OF THIS METHODOLOGY IS >20 mIU/mL. Performed at [facility], [address] [place], [place] [address]   Rapid urine drug screen (hospital performed) not at [facility]     Status: Abnormal   Collection Time: 06/20/22  9:42 AM  Result Value Ref Range   Opiates NONE DETECTED NONE DETECTED   Cocaine NONE DETECTED NONE DETECTED   Benzodiazepines POSITIVE (A) NONE DETECTED   Amphetamines NONE DETECTED NONE DETECTED   Tetrahydrocannabinol POSITIVE (A) NONE DETECTED   Barbiturates NONE DETECTED NONE DETECTED    Comment: (NOTE) DRUG SCREEN FOR MEDICAL PURPOSES ONLY.  IF CONFIRMATION IS NEEDED FOR ANY PURPOSE, NOTIFY LAB WITHIN 5 DAYS.  LOWEST DETECTABLE LIMITS FOR URINE DRUG SCREEN Drug Class                     Cutoff (ng/mL) Amphetamine and metabolites    1000 Barbiturate and metabolites    200 Benzodiazepine                 200 Tricyclics and metabolites     300 Opiates and metabolites        300 Cocaine and metabolites        300 THC  50 Performed at [facility], [address] [place], [place] [address]   Comprehensive metabolic panel     Status: Abnormal   Collection Time: 06/21/22  6:45  AM  Result Value Ref Range   Sodium 140 135 - 145 mmol/L   Potassium 3.3 (L) 3.5 - 5.1 mmol/L   Chloride 107 98 - 111 mmol/L   CO2 25 22 - 32 mmol/L   Glucose, Bld 93 70 - 99 mg/dL    Comment: Glucose reference range applies only to samples taken after fasting for at least 8 hours.   BUN 10 6 - 20 mg/dL   Creatinine, Ser 9.92 0.44 - 1.00 mg/dL   Calcium 8.8 (L) 8.9 - 10.3 mg/dL   Total Protein 6.8 6.5 - 8.1 g/dL   Albumin 3.4 (L) 3.5 - 5.0 g/dL   AST 37 15 - 41 U/L   ALT 45 (H) 0 - 44 U/L   Alkaline Phosphatase 106 38 - 126 U/L   Total Bilirubin 0.3 0.3 - 1.2 mg/dL   GFR, Estimated >42 >68 mL/min    Comment: (NOTE) Calculated using the CKD-EPI Creatinine Equation (2021)    Anion gap 8 5 - 15    Comment: Performed at [facility], [address], [place], [place] [address]    Blood Alcohol level:  Lab Results  Component Value Date   ETH <10 06/20/2022   ETH <10 06/05/2022    Metabolic Disorder Labs: Lab Results  Component Value Date   HGBA1C 5.0 01/19/2022   MPG 97 01/19/2022   No results found for: "PROLACTIN" Lab Results  Component Value Date   CHOL 145 06/20/2022   TRIG 127 06/20/2022   HDL 33 (L) 06/20/2022   CHOLHDL 4.4 06/20/2022   VLDL 25 06/20/2022   LDLCALC 87 06/20/2022   LDLCALC 85 07/28/2021    Physical Findings: AIMS: Facial and Oral Movements Muscles of Facial Expression: None, normal Lips and Perioral Area: None, normal Jaw: None, normal Tongue: None, normal,Extremity Movements Upper (arms, wrists, hands, fingers): None, normal Lower (legs, knees, ankles, toes): None, normal, Trunk Movements Neck, shoulders, hips: None, normal, Overall Severity Severity of abnormal movements (highest score from questions above): None, normal Incapacitation due to abnormal movements: None, normal Patient's awareness of abnormal movements (rate only patient's report): No Awareness, Dental Status Current problems with teeth and/or dentures?:  No Does patient usually wear dentures?: No  CIWA:  CIWA-Ar Total: 8 COWS:     Musculoskeletal: Strength & Muscle Tone: within normal limits Gait & Station: normal Patient leans: N/A  Psychiatric Specialty Exam:  Presentation  General Appearance: Casual; Fairly Groomed  Eye Contact:Good  Speech:Clear and Coherent; Normal Rate  Speech Volume:Normal  Handedness:Right   Mood and Affect  Mood:Euthymic (Patient reports feeling "hopeful")  Affect:Appropriate; Congruent (-Appears brighter.)   Thought Process  Thought Processes:Linear; Coherent  Descriptions of Associations:Intact  Orientation:Full (Time, Place and Person)  Thought Content:Logical  History of Schizophrenia/Schizoaffective disorder:No  Duration of Psychotic Symptoms:No data recorded Hallucinations:Hallucinations: None  Ideas of Reference:None  Suicidal Thoughts:Suicidal Thoughts: No  Homicidal Thoughts:Homicidal Thoughts: No   Sensorium  Memory:Immediate Good; Recent Good; Remote Good  Judgment:Fair  Insight:Fair   Executive Functions  Concentration:Fair  Attention Span:Fair  Recall:Fair  Fund of Knowledge:Fair  Language:Fair   Psychomotor Activity  Psychomotor Activity:Psychomotor Activity: Tremor; Restlessness (Patient is currently on CIWA protocol.  Tremors and restlessness possibly associated with her withdrawal symptoms.)   Assets  Assets:Communication Skills; Desire for Improvement; Housing; Talents/Skills;  Vocational/Educational; Social Support   Sleep  Sleep:Sleep: Good Number of Hours of Sleep: 7.25    Physical Exam: Physical Exam Constitutional:      Appearance: Normal appearance.  Pulmonary:     Effort: Pulmonary effort is normal. No respiratory distress.  Neurological:     General: No focal deficit present.     Mental Status: She is alert and oriented to person, place, and time.  Psychiatric:        Mood and Affect: Mood normal.        Behavior:  Behavior normal.        Thought Content: Thought content normal.        Judgment: Judgment normal.    Review of Systems  Constitutional:  Negative for diaphoresis.  Eyes:  Negative for blurred vision.  Respiratory:  Negative for shortness of breath.   Cardiovascular:  Negative for chest pain.  Gastrointestinal:  Negative for abdominal pain, diarrhea and vomiting.  Musculoskeletal:  Negative for joint pain and myalgias.  Neurological:  Negative for dizziness and headaches.  Psychiatric/Behavioral:  Negative for depression, hallucinations, memory loss, substance abuse and suicidal ideas. The patient is not nervous/anxious and does not have insomnia.    Blood pressure 121/88, pulse (!) 101, temperature 97.8 F (36.6 C), temperature source Oral, resp. rate 16, height 5\' 5"  (1.651 m), weight 63.5 kg, SpO2 100 %. Body mass index is 23.3 kg/m.   Treatment Plan Summary: Daily contact with patient to assess and evaluate symptoms and progress in treatment and Medication management  ASSESSMENT:   Diagnoses / Active Problems: - Major depressive disorder, recurrent severe, without psychosis - Generalized anxiety disorder - Alcohol use disorder, severe, dependence - Tobacco use disorder - Cannabis abuse   PLAN: Safety and Monitoring:             -- Voluntary admission to inpatient psychiatric unit for safety, stabilization and treatment             -- Daily contact with patient to assess and evaluate symptoms and progress in treatment             -- Patient's case to be discussed in multi-disciplinary team meeting             -- Observation Level : q15 minute checks             -- Vital signs:  q12 hours             -- Precautions: suicide, elopement, and assault   2. Psychiatric Diagnoses and Treatment:  - Continue Prozac 60 mg once a day, for depression and anxiety -- The risks/benefits/side-effects/alternatives to this medication were discussed in detail with the patient and time was  given for questions. The patient consents to medication trial.              -- Metabolic profile and EKG monitoring obtained while on an atypical antipsychotic (BMI: Lipid Panel: HbgA1c: QTc:)              -- Encouraged patient to participate in unit milieu and in scheduled group therapies              -- Short Term Goals: Ability to identify changes in lifestyle to reduce recurrence of condition will improve, Ability to verbalize feelings will improve, Ability to disclose and discuss suicidal ideas, Ability to demonstrate self-control will improve, Ability to identify and develop effective coping behaviors will improve, Ability to maintain clinical measurements within normal limits will improve,  Compliance with prescribed medications will improve, and Ability to identify triggers associated with substance abuse/mental health issues will improve             -- Long Term Goals: Improvement in symptoms so as ready for discharge                3. Medical Issues Being Addressed:              Tobacco Use Disorder             -- Nicotine patch /24 hours ordered             -- Smoking cessation encouraged  Withdrawals/Seizures - Continue CIWA protocol - Continue Ativan 1 mg as needed every 2 hours - Continue  Ativan 2 mg every 6 hours - Continue  Gabapentin 800 milligrams 4 times daily - Continue Keppra 500 mg twice daily - Continue Norvasc 5 mg daily   Hypertension - Continue clonidine 0.1 mg every 4 hours as needed, give 1 dose if systolic BP is over 180 and/or diastolic BP is over 100. - Continue Norvasc 5 mg daily  Hypokalemia - (06/20/2022) 2.8 - Order potassium chloride tablet 40 mEq 2 times daily for 1 dose - BMP: pending   4. Discharge Planning:              -- Social work and case management to assist with discharge planning and identification of hospital follow-up needs prior to discharge             -- Estimated LOS: 5-7 days             -- Discharge Concerns: Need to establish  a safety plan; Medication compliance and effectiveness             -- Discharge Goals: Return home with outpatient referrals for mental health follow-up including medication management/psychotherapy   Lorri Frederick, MD 06/21/2022, 5:33 PM

## 2022-06-21 NOTE — Progress Notes (Signed)
   06/21/22 2041  Psych Admission Type (Psych Patients Only)  Admission Status Involuntary  Psychosocial Assessment  Patient Complaints Anxiety  Eye Contact Fair  Facial Expression Anxious  Affect Anxious  Speech Logical/coherent  Interaction Assertive  Motor Activity Fidgety  Appearance/Hygiene Unremarkable  Behavior Characteristics Cooperative  Mood Anxious  Thought Process  Coherency WDL  Content WDL  Delusions None reported or observed  Perception WDL  Hallucination None reported or observed  Judgment Impaired  Confusion None  Danger to Self  Current suicidal ideation? Denies  Self-Injurious Behavior No self-injurious ideation or behavior indicators observed or expressed   Agreement Not to Harm Self Yes  Description of Agreement verbal contract for safety  Danger to Others  Danger to Others None reported or observed   D: Patient in dayroom reports she had a good day and is able to engage therapeutically. Pt anxious and almost tearful that husband did not visit tonight. Pt reported she need to get well to work on her marriage. A: Medications administered as prescribed. Support and encouragement provided as needed.  R: Patient remains safe on the unit. Will continue to monitor for safety and stability.

## 2022-06-21 NOTE — BH IP Treatment Plan (Signed)
Interdisciplinary Treatment and Diagnostic Plan Update  06/21/2022 Time of Session: 9:00am  Kelsey [name] MRN: [id number]  Principal Diagnosis: Alcohol use disorder, severe, dependence ([place])  Secondary Diagnoses: Principal Problem:   Alcohol use disorder, severe, dependence ([place]) Active Problems:   MDD (major depressive disorder), recurrent severe, without psychosis ([place])   Tobacco use disorder   Cannabis abuse   Current Medications:  Current Facility-Administered Medications  Medication Dose Route Frequency Provider Last Rate Last Admin   acetaminophen (TYLENOL) tablet 650 mg  650 mg Oral Q6H PRN Onuoha, Chinwendu V, NP       albuterol (VENTOLIN HFA) 108 (90 Base) MCG/ACT inhaler 2 puff  2 puff Inhalation Q4H PRN Onuoha, Chinwendu V, NP   2 puff at 06/21/22 0639   alum & mag hydroxide-simeth (MAALOX/MYLANTA) 200-200-20 MG/5ML suspension 30 mL  30 mL Oral Q4H PRN Onuoha, Chinwendu V, NP       amLODipine (NORVASC) tablet 5 mg  5 mg Oral Daily Massengill, Nathan, MD   5 mg at 06/21/22 4081   cloNIDine (CATAPRES) tablet 0.1 mg  0.1 mg Oral Q4H PRN Massengill, Ovid Curd, MD   0.1 mg at 06/21/22 4481   cyclobenzaprine (FLEXERIL) tablet 10 mg  10 mg Oral TID PRN Onuoha, Chinwendu V, NP   10 mg at 06/21/22 8563   feeding supplement (BOOST / RESOURCE BREEZE) liquid 1 Container  1 Container Oral Q24H Massengill, Nathan, MD       feeding supplement (ENSURE ENLIVE / ENSURE PLUS) liquid 237 mL  237 mL Oral Q24H Massengill, Nathan, MD       FLUoxetine (PROZAC) capsule 60 mg  60 mg Oral Daily Massengill, Ovid Curd, MD   60 mg at 06/21/22 1497   gabapentin (NEURONTIN) capsule 800 mg  800 mg Oral QID Massengill, Ovid Curd, MD   800 mg at 06/21/22 1119   hydrOXYzine (ATARAX) tablet 25 mg  25 mg Oral Q6H PRN Onuoha, Chinwendu V, NP   25 mg at 06/21/22 0263   levETIRAcetam (KEPPRA) tablet 500 mg  500 mg Oral BID Onuoha, Chinwendu V, NP   500 mg at 06/21/22 7858   loperamide (IMODIUM) capsule 2-4 mg  2-4 mg Oral  PRN Onuoha, Chinwendu V, NP       LORazepam (ATIVAN) injection 2 mg  2 mg Intramuscular BID PRN Massengill, Ovid Curd, MD       LORazepam (ATIVAN) tablet 1 mg  1 mg Oral Q6H PRN Onuoha, Chinwendu V, NP   1 mg at 06/20/22 0856   LORazepam (ATIVAN) tablet 2 mg  2 mg Oral Q6H Massengill, Nathan, MD   2 mg at 06/21/22 1118   magnesium hydroxide (MILK OF MAGNESIA) suspension 30 mL  30 mL Oral Daily PRN Onuoha, Chinwendu V, NP       multivitamin with minerals tablet 1 tablet  1 tablet Oral Daily Onuoha, Chinwendu V, NP   1 tablet at 06/21/22 8502   nicotine (NICODERM CQ - dosed in mg/24 hours) patch 14 mg  14 mg Transdermal Q0600 Onuoha, Chinwendu V, NP   14 mg at 06/21/22 0825   ondansetron (ZOFRAN-ODT) disintegrating tablet 4 mg  4 mg Oral Q6H PRN Onuoha, Chinwendu V, NP       thiamine (B-1) injection 100 mg  100 mg Intramuscular Once Onuoha, Chinwendu V, NP       thiamine tablet 100 mg  100 mg Oral Daily Onuoha, Chinwendu V, NP   100 mg at 06/21/22 0828   traZODone (DESYREL) tablet 50 mg  50  mg Oral QHS PRN Janine Limbo, MD   50 mg at 06/20/22 2133   PTA Medications: Medications Prior to Admission  Medication Sig Dispense Refill Last Dose   albuterol (VENTOLIN HFA) 108 (90 Base) MCG/ACT inhaler Inhale 1-2 puffs into the lungs every 6 (six) hours as needed for wheezing or shortness of breath.      cyclobenzaprine (FLEXERIL) 10 MG tablet Take 1 tablet (10 mg total) by mouth 3 (three) times daily as needed for muscle spasms. 60 tablet 2 Past Month   diphenhydrAMINE (BENADRYL) 25 MG tablet Take 25 mg by mouth every 6 (six) hours as needed for allergies.   Past Month   FLUoxetine (PROZAC) 20 MG capsule Take 60 mg by mouth daily. Patient stated on 62m daily, prescriber considering increase to 844m  06/19/2022   ALPRAZolam (XANAX) 1 MG tablet Take 1 tablet (1 mg total) by mouth at bedtime as needed for anxiety. (Patient taking differently: Take 1 mg by mouth 3 (three) times daily.) 7 tablet 0 06/17/2022    gabapentin (NEURONTIN) 300 MG capsule TAKE 2 CAPSULES (600 MG TOTAL) BY MOUTH 3 (THREE) TIMES DAILY. (Patient taking differently: Take 1,200 mg by mouth 4 (four) times daily.) 540 capsule 1    HYDROcodone-acetaminophen (NORCO) 7.5-325 MG tablet Take 1 tablet by mouth every 6 (six) hours as needed for moderate pain. 30 tablet 0 Unknown   levETIRAcetam (KEPPRA) 500 MG tablet Take 1 tablet (500 mg total) by mouth 2 (two) times daily. 60 tablet 1 Unknown   levonorgestrel (MIRENA) 20 MCG/24HR IUD 1 Intra Uterine Device (1 each total) by Intrauterine route once. 1 each 0 Unknown    Patient Stressors:    Patient Strengths:    Treatment Modalities: Medication Management, Group therapy, Case management,  1 to 1 session with clinician, Psychoeducation, Recreational therapy.   Physician Treatment Plan for Primary Diagnosis: Alcohol use disorder, severe, dependence ([place]Long Term Goal(s): Improvement in symptoms so as ready for discharge   Short Term Goals: Ability to identify changes in lifestyle to reduce recurrence of condition will improve Ability to verbalize feelings will improve Ability to disclose and discuss suicidal ideas Ability to demonstrate self-control will improve Ability to identify and develop effective coping behaviors will improve Ability to maintain clinical measurements within normal limits will improve Compliance with prescribed medications will improve Ability to identify triggers associated with substance abuse/mental health issues will improve  Medication Management: Evaluate patient's response, side effects, and tolerance of medication regimen.  Therapeutic Interventions: 1 to 1 sessions, Unit Group sessions and Medication administration.  Evaluation of Outcomes: Not Met  Physician Treatment Plan for Secondary Diagnosis: Principal Problem:   Alcohol use disorder, severe, dependence ([place] Problems:   MDD (major depressive disorder), recurrent severe,  without psychosis ([place]  Tobacco use disorder   Cannabis abuse  Long Term Goal(s): Improvement in symptoms so as ready for discharge   Short Term Goals: Ability to identify changes in lifestyle to reduce recurrence of condition will improve Ability to verbalize feelings will improve Ability to disclose and discuss suicidal ideas Ability to demonstrate self-control will improve Ability to identify and develop effective coping behaviors will improve Ability to maintain clinical measurements within normal limits will improve Compliance with prescribed medications will improve Ability to identify triggers associated with substance abuse/mental health issues will improve     Medication Management: Evaluate patient's response, side effects, and tolerance of medication regimen.  Therapeutic Interventions: 1 to 1 sessions, Unit Group sessions and Medication  administration.  Evaluation of Outcomes: Not Met   RN Treatment Plan for Primary Diagnosis: Alcohol use disorder, severe, dependence ([place]) Long Term Goal(s): Knowledge of disease and therapeutic regimen to maintain health will improve  Short Term Goals: Ability to remain free from injury will improve, Ability to participate in decision making will improve, Ability to verbalize feelings will improve, Ability to disclose and discuss suicidal ideas, and Ability to identify and develop effective coping behaviors will improve  Medication Management: RN will administer medications as ordered by provider, will assess and evaluate patient's response and provide education to patient for prescribed medication. RN will report any adverse and/or side effects to prescribing provider.  Therapeutic Interventions: 1 on 1 counseling sessions, Psychoeducation, Medication administration, Evaluate responses to treatment, Monitor vital signs and CBGs as ordered, Perform/monitor CIWA, COWS, AIMS and Fall Risk screenings as ordered, Perform wound care treatments  as ordered.  Evaluation of Outcomes: Not Met   LCSW Treatment Plan for Primary Diagnosis: Alcohol use disorder, severe, dependence ([place]) Long Term Goal(s): Safe transition to appropriate next level of care at discharge, Engage patient in therapeutic group addressing interpersonal concerns.  Short Term Goals: Engage patient in aftercare planning with referrals and resources, Increase social support, Increase emotional regulation, Facilitate acceptance of mental health diagnosis and concerns, Identify triggers associated with mental health/substance abuse issues, and Increase skills for wellness and recovery  Therapeutic Interventions: Assess for all discharge needs, 1 to 1 time with Social worker, Explore available resources and support systems, Assess for adequacy in community support network, Educate family and significant other(s) on suicide prevention, Complete Psychosocial Assessment, Interpersonal group therapy.  Evaluation of Outcomes: Not Met   Progress in Treatment: Attending groups: Yes. Participating in groups: Yes. Taking medication as prescribed: Yes. Toleration medication: Yes. Family/Significant other contact made: Yes, individual(s) contacted:  Friend  Patient understands diagnosis: Yes. Discussing patient identified problems/goals with staff: Yes. Medical problems stabilized or resolved: Yes. Denies suicidal/homicidal ideation: Yes. Issues/concerns per patient self-inventory: No.   New problem(s) identified: No, Describe:  None   New Short Term/Long Term Goal(s): medication stabilization, elimination of SI thoughts, development of comprehensive mental wellness plan.   Patient Goals: "To stop my substance use and be healthier"  Discharge Plan or Barriers: Patient recently admitted. CSW will continue to follow and assess for appropriate referrals and possible discharge planning.   Reason for Continuation of Hospitalization: Depression Medication  stabilization Suicidal ideation Withdrawal symptoms  Estimated Length of Stay: 3 to 7 days   Last 3 [place] Suicide Severity Risk Score: Flowsheet Row Admission (Current) from OP Visit from 06/19/2022 in [facility] ED from 06/05/2022 in [place] Admission (Discharged) from 05/05/2022 in [facility] [facility] High Risk High Risk No Risk       Last PHQ 2/9 Scores:    02/23/2022   11:01 AM 01/26/2022    8:49 AM 11/11/2021   11:06 AM  Depression screen PHQ 2/9  Decreased Interest 0 0 0  Down, Depressed, Hopeless 0 0 0  PHQ - 2 Score 0 0 0  Altered sleeping   0  Tired, decreased energy   0  Change in appetite   0  Feeling bad or failure about yourself    0  Trouble concentrating   0  Moving slowly or fidgety/restless   0  Suicidal thoughts   0  PHQ-9 Score   0  Difficult doing work/chores   Not difficult at  all    Scribe for Treatment Team: Carney Harder 06/21/2022 1:49 PM

## 2022-06-22 LAB — BASIC METABOLIC PANEL
Anion gap: 6 (ref 5–15)
BUN: 7 mg/dL (ref 6–20)
CO2: 25 mmol/L (ref 22–32)
Calcium: 8.4 mg/dL — ABNORMAL LOW (ref 8.9–10.3)
Chloride: 110 mmol/L (ref 98–111)
Creatinine, Ser: 0.58 mg/dL (ref 0.44–1.00)
GFR, Estimated: >60 mL/min (ref >60)
Glucose, Bld: 78 mg/dL (ref 70–99)
Potassium: 3.7 mmol/L (ref 3.5–5.1)
Sodium: 141 mmol/L (ref 135–145)

## 2022-06-22 MED ORDER — GABAPENTIN 400 MG PO CAPS
800.0000 mg | ORAL_CAPSULE | Freq: Three times a day (TID) | ORAL | Status: DC
  Administered 2022-06-23: 800 mg via ORAL
  Filled 2022-06-22 (×4): qty 2

## 2022-06-22 MED ORDER — LORAZEPAM 1 MG PO TABS
1.0000 mg | ORAL_TABLET | Freq: Four times a day (QID) | ORAL | Status: DC
  Administered 2022-06-22 – 2022-06-23 (×3): 1 mg via ORAL
  Filled 2022-06-22 (×3): qty 1

## 2022-06-22 NOTE — Progress Notes (Signed)
[facility] MD Progress Note  06/22/2022 6:33 AM Kelsey [name]  MRN:  [id number] Principal Problem: Alcohol use disorder, severe, dependence ([place]) Diagnosis: Principal Problem:   Alcohol use disorder, severe, dependence ([place]) Active Problems:   MDD (major depressive disorder), recurrent severe, without psychosis ([place])   Tobacco use disorder   Cannabis abuse  Patient is a 40 year old female with a past psychiatric history of alcohol use disorder with withdrawal seizures, major depressive disorder who was voluntarily admitted to [facility] on 06/20/22  due to worsening depression and suicidal ideation with a plan to overdose on Xanax and alcohol.  Patient had a suicide attempt 15 days ago by taking a handful of hydrocodone pills and Xanax.   Yesterday the following recommendations were made: - Continue Prozac 60 mg once a day, for depression and anxiety  For Alcohol Withdrawals and Seizures - Continue CIWA protocol - Continue Ativan 1 mg as needed every 2 hours - Decrease Ativan 2 mg to 1.5 mg every 6 hours - Continue Gabapentin 800 milligrams 4 times daily - Continue Keppra 500 mg twice daily - Increase Norvasc 10 mg daily   Interval history: As needed medications, last 24 hours: -Albuterol (3X) - Clonidine (1X) - Flexeril (2X) - Atarax (1X) - Trazodone (1X)  Per nursing: Patient slept 7.25 hours last night.  Her CIWA score 5 PM (06/21/2022) was 8.  Her most recent CIWA score at 12 AM (06/22/2022) is 3.  She attended group meetings.  She appeared anxious and tearful last night when her husband did not come to visit.  Continues to have significant tremors.  On assessment today, the patient was evaluated at bedside. She reports feeling well this morning, noting that she became sad and withdrawn when her husband did not visit her last night. Her appetite is fair, stating she is "not eating a whole lot." Patient reports sleeping well, with no interruptions or nightmares.  Overall, her depressive symptoms have improved, but she experiences mood fluctuations during the day when thinking about her marriage. She denies any passive or active suicidal ideation, contacts for safety on the unit. This morning, the patient rates her anxiety at 3/10 (10 being the worst) and notes it can range from 6 to 7 during the day. She denies experiencing any panic attacks while in the unit. She reports responding well to her current dose of Prozac and denies any side effects. The patient denies any cravings and having experienced any prodromal symptoms of seizures. When asked about any changes in her status compared to yesterday, she reports feeling "just as good as yesterday."  Today, the patient denies homicidal ideation, auditory and visual hallucinations. She denies thought broadcasting, thought insertion, and ideas of reference. There are no apparent paranoid thought processes. There are no apparent delusional thought processes.  The patient denies any side effects to currently prescribed psychiatric medication. She denies any somatic symptoms related to her withdrawal or otherwise.  During the afternoon evaluation, the patient expressed a desire to be discharged to [facility] upon completing her hospitalization. She was informed of a new policy that mandates discontinuing gabapentin use for admission. As an alternative, she was offered the option to explore other rehab facilities. The patient has decided to taper down her gabapentin usage and maintain the plan for discharge to [facility]. Throughout this process, close monitoring of her progress will be conducted to ensure a safe and effective transition.  Patient's CIWA score at the time of this evaluation was  6.5.   Past Psychiatric History:  Previous Psych Diagnoses: Major depressive disorder (diagnosed at age 36) Prior inpatient treatment: [facility] (8-9 years ago for depression) Prior rehab hx: [facility] (2018), patient  remains over a year following her rehab treatment there. History of suicide: Patient attempted suicide by overdose with Xanax and alcohol 15 days prior to admission (7/10/223). Psychiatric medication compliance history: Compliant Neuromodulation history: Denies Current Psychiatrist/Outpatient Provider: Dr. Toy Care, private practice Current therapist: Denies  Past Medical History:  Past Medical History:  Diagnosis Date   Anxiety    Asthma    hosp. 2018 and 2019 no h/o intubation    Depression    GERD (gastroesophageal reflux disease)    Hypertension    Palpitations    Perianal abscess 11/22/2016   12/07/2016.  [facility] [place].  [name], MD.  Perianal abscess is much better but still needs local wound care.  Continue sitz baths 2-3 times perday until all drainage and pain are gone.  Follow up as needed.   Preop general physical exam 01/26/2022   PVC (premature ventricular contraction)    Seasonal allergies    Seizures ([place])    01/15/22 was last seizure   Tachycardia     Past Surgical History:  Procedure Laterality Date   CESAREAN SECTION  10/15/2009, 02/08/2006   INCISION AND DRAINAGE PERIRECTAL ABSCESS Bilateral 11/22/2016   Procedure: IRRIGATION AND DEBRIDEMENT PERIRECTAL ABSCESS;  Surgeon: [name], MD;  Location: [facility];  Service: General;  Laterality: Bilateral;   INTRAUTERINE DEVICE INSERTION     Mirena 12/24/19 exp 12/28/21 Dr Allyn Kenner [facility]   TOTAL HIP ARTHROPLASTY Left 02/17/2022   Procedure: Left TOTAL HIP ARTHROPLASTY ANTERIOR APPROACH;  Surgeon: Mcarthur Rossetti, MD;  Location: [facility];  Service: Orthopedics;  Laterality: Left;   TOTAL HIP ARTHROPLASTY Right 05/05/2022   Procedure: RIGHT TOTAL HIP ARTHROPLASTY ANTERIOR APPROACH;  Surgeon: Mcarthur Rossetti, MD;  Location: [facility];  Service: Orthopedics;  Laterality: Right;   Family History:  Family History  Problem Relation Age of Onset   Rheumatologic disease Mother     Arthritis Mother    Asthma Mother    Depression Mother    Heart disease Father    Asthma Maternal Grandmother    Hypertension Maternal Grandmother    Heart disease Maternal Grandfather    Lung cancer Paternal Grandmother    Healthy Daughter    Healthy Son    Family Psychiatric  History:  Mother Dx: MDD  Social History:  Social History   Substance and Sexual Activity  Alcohol Use Yes     Social History   Substance and Sexual Activity  Drug Use Yes   Types: Marijuana   Comment: yesterday    Social History   Socioeconomic History   Marital status: Married    Spouse name: Not on file   Number of children: 2   Years of education: Not on file   Highest education level: Not on file  Occupational History   Occupation: respiratory therapists    Employer: [employer]  Tobacco Use   Smoking status: Every Day    Packs/day: 0.25    Years: 10.00    Total pack years: 2.50    Types: Cigarettes   Smokeless tobacco: Never  Vaping Use   Vaping Use: Never used  Substance and Sexual Activity   Alcohol use: Yes   Drug use: Yes    Types: Marijuana    Comment: yesterday   Sexual activity:  Not on file  Other Topics Concern   Not on file  Social History Narrative   Respiratory Therapist, 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[employer]    2 kids    Divorced but remarried since 05/2019 new last name will be Carlis Abbott   Wears seat belt, feels safe in relationship    Social Determinants of Radio broadcast assistant Strain: Not on file  Food Insecurity: Not on file  Transportation Needs: Not on file  Physical Activity: Not on file  Stress: Not on file  Social Connections: Not on file   Additional Social History:    Sleep: Good  Appetite:  Good  Current Medications: Current Facility-Administered Medications  Medication Dose Route Frequency Provider Last Rate Last Admin   acetaminophen (TYLENOL) tablet 650 mg  650 mg Oral Q6H PRN Onuoha, Chinwendu V, NP       albuterol (VENTOLIN  HFA) 108 (90 Base) MCG/ACT inhaler 2 puff  2 puff Inhalation Q4H PRN Onuoha, Chinwendu V, NP   2 puff at 06/21/22 2115   alum & mag hydroxide-simeth (MAALOX/MYLANTA) 200-200-20 MG/5ML suspension 30 mL  30 mL Oral Q4H PRN Onuoha, Chinwendu V, NP       amLODipine (NORVASC) tablet 10 mg  10 mg Oral Daily Massengill, Nathan, MD       cloNIDine (CATAPRES) tablet 0.1 mg  0.1 mg Oral Q4H PRN Massengill, Ovid Curd, MD   0.1 mg at 06/21/22 [id number]   cyclobenzaprine (FLEXERIL) tablet 10 mg  10 mg Oral TID PRN Onuoha, Chinwendu V, NP   10 mg at 06/21/22 1608   feeding supplement (BOOST / RESOURCE BREEZE) liquid 1 Container  1 Container Oral Q24H Massengill, Nathan, MD       feeding supplement (ENSURE ENLIVE / ENSURE PLUS) liquid 237 mL  237 mL Oral Q24H Massengill, Nathan, MD       FLUoxetine (PROZAC) capsule 60 mg  60 mg Oral Daily Massengill, Ovid Curd, MD   60 mg at 06/21/22 [id number]   gabapentin (NEURONTIN) capsule 800 mg  800 mg Oral QID Massengill, Ovid Curd, MD   800 mg at 06/21/22 2114   hydrOXYzine (ATARAX) tablet 25 mg  25 mg Oral Q6H PRN Onuoha, Chinwendu V, NP   25 mg at 06/21/22 [id number]   levETIRAcetam (KEPPRA) tablet 500 mg  500 mg Oral BID Onuoha, Chinwendu V, NP   500 mg at 06/21/22 1609   loperamide (IMODIUM) capsule 2-4 mg  2-4 mg Oral PRN Onuoha, Chinwendu V, NP       LORazepam (ATIVAN) injection 2 mg  2 mg Intramuscular BID PRN Massengill, Ovid Curd, MD       LORazepam (ATIVAN) tablet 1 mg  1 mg Oral Q6H PRN Onuoha, Chinwendu V, NP   1 mg at 06/20/22 0856   LORazepam (ATIVAN) tablet 1.5 mg  1.5 mg Oral Q6H Massengill, Nathan, MD   1.5 mg at 06/22/22 0015   magnesium hydroxide (MILK OF MAGNESIA) suspension 30 mL  30 mL Oral Daily PRN Onuoha, Chinwendu V, NP       multivitamin with minerals tablet 1 tablet  1 tablet Oral Daily Onuoha, Chinwendu V, NP   1 tablet at 06/21/22 [id number]   nicotine (NICODERM CQ - dosed in mg/24 hours) patch 14 mg  14 mg Transdermal Q0600 Onuoha, Chinwendu V, NP   14 mg at 06/21/22 0825    ondansetron (ZOFRAN-ODT) disintegrating tablet 4 mg  4 mg Oral Q6H PRN Onuoha, Chinwendu V, NP  thiamine (B-1) injection 100 mg  100 mg Intramuscular Once Onuoha, Chinwendu V, NP       thiamine tablet 100 mg  100 mg Oral Daily Onuoha, Chinwendu V, NP   100 mg at 06/21/22 4462   traZODone (DESYREL) tablet 50 mg  50 mg Oral QHS PRN Phineas Inches, MD   50 mg at 06/21/22 2114    Lab Results:  Results for orders placed or performed during the hospital encounter of 06/19/22 (from the past 48 hour(s))  CBC     Status: Abnormal   Collection Time: 06/20/22  6:42 AM  Result Value Ref Range   WBC 5.6 4.0 - 10.5 K/uL   RBC 3.92 3.87 - 5.11 MIL/uL   Hemoglobin 12.9 12.0 - 15.0 g/dL   HCT 86.3 81.7 - 71.1 %   MCV 101.5 (H) 80.0 - 100.0 fL   MCH 32.9 26.0 - 34.0 pg   MCHC 32.4 30.0 - 36.0 g/dL   RDW 65.7 (H) 90.3 - 83.3 %   Platelets 217 150 - 400 K/uL   nRBC 0.0 0.0 - 0.2 %    Comment: Performed at [facility], [address], [place], [place] [address]  Comprehensive metabolic panel     Status: Abnormal   Collection Time: 06/20/22  6:42 AM  Result Value Ref Range   Sodium 139 135 - 145 mmol/L   Potassium 2.8 (L) 3.5 - 5.1 mmol/L   Chloride 104 98 - 111 mmol/L   CO2 27 22 - 32 mmol/L   Glucose, Bld 94 70 - 99 mg/dL    Comment: Glucose reference range applies only to samples taken after fasting for at least 8 hours.   BUN 14 6 - 20 mg/dL   Creatinine, Ser 1.91 0.44 - 1.00 mg/dL   Calcium 8.6 (L) 8.9 - 10.3 mg/dL   Total Protein 6.5 6.5 - 8.1 g/dL   Albumin 3.2 (L) 3.5 - 5.0 g/dL   AST 48 (H) 15 - 41 U/L   ALT 53 (H) 0 - 44 U/L   Alkaline Phosphatase 100 38 - 126 U/L   Total Bilirubin 0.4 0.3 - 1.2 mg/dL   GFR, Estimated >66 >06 mL/min    Comment: (NOTE) Calculated using the CKD-EPI Creatinine Equation (2021)    Anion gap 8 5 - 15    Comment: Performed at [facility], [address], [place], [place] [address]  Ethanol     Status: None    Collection Time: 06/20/22  6:42 AM  Result Value Ref Range   Alcohol, Ethyl (B) <10 <10 mg/dL    Comment: (NOTE) Lowest detectable limit for serum alcohol is 10 mg/dL.  For medical purposes only. Performed at [facility], [address], [place], [place] [address]   Lipid panel     Status: Abnormal   Collection Time: 06/20/22  6:42 AM  Result Value Ref Range   Cholesterol 145 0 - 200 mg/dL   Triglycerides 423 <953 mg/dL   HDL 33 (L) >20 mg/dL   Total CHOL/HDL Ratio 4.4 RATIO   VLDL 25 0 - 40 mg/dL   LDL Cholesterol 87 0 - 99 mg/dL    Comment:        Total Cholesterol/HDL:CHD Risk Coronary Heart Disease Risk Table                     Men   Women  1/2 Average Risk   3.4   3.3  Average  Risk       5.0   4.4  2 X Average Risk   9.6   7.1  3 X Average Risk  23.4   11.0        Use the calculated Patient Ratio above and the CHD Risk Table to determine the patient's CHD Risk.        ATP III CLASSIFICATION (LDL):  <100     mg/dL   Optimal  782-956  mg/dL   Near or Above                    Optimal  130-159  mg/dL   Borderline  213-086  mg/dL   High  >578     mg/dL   Very High Performed at [facility], [address] [place], [place] [address]   TSH     Status: None   Collection Time: 06/20/22  6:42 AM  Result Value Ref Range   TSH 3.374 0.350 - 4.500 uIU/mL    Comment: Performed by a 3rd Generation assay with a functional sensitivity of <=0.01 uIU/mL. Performed at [facility], [address] [place], [place] [address]   Pregnancy, urine     Status: None   Collection Time: 06/20/22  9:42 AM  Result Value Ref Range   Preg Test, Ur NEGATIVE NEGATIVE    Comment:        THE SENSITIVITY OF THIS METHODOLOGY IS >20 mIU/mL. Performed at [facility], [address], [place], [place] [address]   Rapid urine drug screen (hospital performed) not at [facility]     Status: Abnormal   Collection Time:  06/20/22  9:42 AM  Result Value Ref Range   Opiates NONE DETECTED NONE DETECTED   Cocaine NONE DETECTED NONE DETECTED   Benzodiazepines POSITIVE (A) NONE DETECTED   Amphetamines NONE DETECTED NONE DETECTED   Tetrahydrocannabinol POSITIVE (A) NONE DETECTED   Barbiturates NONE DETECTED NONE DETECTED    Comment: (NOTE) DRUG SCREEN FOR MEDICAL PURPOSES ONLY.  IF CONFIRMATION IS NEEDED FOR ANY PURPOSE, NOTIFY LAB WITHIN 5 DAYS.  LOWEST DETECTABLE LIMITS FOR URINE DRUG SCREEN Drug Class                     Cutoff (ng/mL) Amphetamine and metabolites    1000 Barbiturate and metabolites    200 Benzodiazepine                 200 Tricyclics and metabolites     300 Opiates and metabolites        300 Cocaine and metabolites        300 THC                            50 Performed at [facility], [address] [place], [place] [address]   Comprehensive metabolic panel     Status: Abnormal   Collection Time: 06/21/22  6:45 AM  Result Value Ref Range   Sodium 140 135 - 145 mmol/L   Potassium 3.3 (L) 3.5 - 5.1 mmol/L   Chloride 107 98 - 111 mmol/L   CO2 25 22 - 32 mmol/L   Glucose, Bld 93 70 - 99 mg/dL    Comment: Glucose reference range applies only to samples taken after fasting for at least 8 hours.   BUN 10 6 - 20 mg/dL   Creatinine, Ser 2.53 0.44 - 1.00  mg/dL   Calcium 8.8 (L) 8.9 - 10.3 mg/dL   Total Protein 6.8 6.5 - 8.1 g/dL   Albumin 3.4 (L) 3.5 - 5.0 g/dL   AST 37 15 - 41 U/L   ALT 45 (H) 0 - 44 U/L   Alkaline Phosphatase 106 38 - 126 U/L   Total Bilirubin 0.3 0.3 - 1.2 mg/dL   GFR, Estimated >60 >60 mL/min    Comment: (NOTE) Calculated using the CKD-EPI Creatinine Equation (2021)    Anion gap 8 5 - 15    Comment: Performed at [facility], [place] [address] [place], [place] [address]    Blood Alcohol level:  Lab Results  Component Value Date   ETH <10 06/20/2022   ETH <10 0000000    Metabolic Disorder Labs: Lab Results   Component Value Date   HGBA1C 5.0 01/19/2022   MPG 97 01/19/2022   No results found for: "PROLACTIN" Lab Results  Component Value Date   CHOL 145 06/20/2022   TRIG 127 06/20/2022   HDL 33 (L) 06/20/2022   CHOLHDL 4.4 06/20/2022   VLDL 25 06/20/2022   LDLCALC 87 06/20/2022   LDLCALC 85 07/28/2021    Physical Findings: AIMS: Facial and Oral Movements Muscles of Facial Expression: None, normal Lips and Perioral Area: None, normal Jaw: None, normal Tongue: None, normal,Extremity Movements Upper (arms, wrists, hands, fingers): None, normal Lower (legs, knees, ankles, toes): None, normal, Trunk Movements Neck, shoulders, hips: None, normal, Overall Severity Severity of abnormal movements (highest score from questions above): None, normal Incapacitation due to abnormal movements: None, normal Patient's awareness of abnormal movements (rate only patient's report): No Awareness, Dental Status Current problems with teeth and/or dentures?: No Does patient usually wear dentures?: No  CIWA:  CIWA-Ar Total: 3 COWS:     Musculoskeletal: Strength & Muscle Tone: within normal limits Gait & Station: normal Patient leans: N/A  Psychiatric Specialty Exam:  Presentation  General Appearance: Casual; Fairly Groomed  Eye Contact:Good  Speech:Clear and Coherent; Normal Rate  Speech Volume:Normal  Handedness:Right   Mood and Affect  Mood:Euthymic (Patient reports feeling "hopeful")  Affect:Appropriate; Congruent (-Appears brighter.)   Thought Process  Thought Processes:Linear; Coherent  Descriptions of Associations:Intact  Orientation:Full (Time, Place and Person)  Thought Content:Logical  History of Schizophrenia/Schizoaffective disorder:No  Duration of Psychotic Symptoms:No data recorded Hallucinations:Hallucinations: None  Ideas of Reference:None  Suicidal Thoughts:Suicidal Thoughts: No  Homicidal Thoughts:Homicidal Thoughts: No   Sensorium  Memory:Immediate  Good; Recent Good; Remote Good  Judgment:Fair  Insight:Fair   Executive Functions  Concentration:Fair  Attention Span:Fair  [place]   Psychomotor Activity  Psychomotor Activity:No data recorded   Assets  Assets:Communication Skills; Desire for Improvement; Housing; Talents/Skills; Vocational/Educational; Social Support   Sleep  Sleep:Sleep: Good Number of Hours of Sleep: 7.25    Physical Exam: Physical Exam Constitutional:      Appearance: Normal appearance.  Pulmonary:     Effort: Pulmonary effort is normal. No respiratory distress.  Neurological:     General: No focal deficit present.     Mental Status: She is alert and oriented to person, place, and time.  Psychiatric:        Mood and Affect: Mood normal.        Behavior: Behavior normal.        Thought Content: Thought content normal.        Judgment: Judgment normal.    Review of Systems  Constitutional:  Negative for diaphoresis.  Eyes:  Negative for blurred vision.  Respiratory:  Negative for shortness of breath.   Cardiovascular:  Negative for chest pain.  Gastrointestinal:  Negative for abdominal pain, diarrhea and vomiting.  Musculoskeletal:  Negative for joint pain and myalgias.  Neurological:  Negative for dizziness and headaches.  Psychiatric/Behavioral:  Negative for depression, hallucinations, memory loss, substance abuse and suicidal ideas. The patient is not nervous/anxious and does not have insomnia.    Blood pressure 123/81, pulse (!) 101, temperature 97.8 F (36.6 C), temperature source Oral, resp. rate 16, height 5\' 5"  (1.651 m), weight 63.5 kg, SpO2 100 %. Body mass index is 23.3 kg/m.   Treatment Plan Summary: Daily contact with patient to assess and evaluate symptoms and progress in treatment and Medication management  ASSESSMENT: Patient is interested in attending rehab at [facility], which currently has a policy of not  accepting anyone that is currently on gabapentin.  Patient has agreed to tapering down her gabapentin.  Abrupt discontinuation of gabapentin in patients with a history of alcohol abuse and seizures can elevate the risk of withdrawal seizures.  Given the patient's history of severe alcohol abuse and withdrawal seizures, a cautious and gradual tapering of gabapentin is essential for mitigating seizure risk, managing neurological adaptation, optimizing symptom control, and providing adequate psychological and behavioral support.   Diagnoses / Active Problems: - Major depressive disorder, recurrent severe, without psychosis - Generalized anxiety disorder - Alcohol use disorder, severe, dependence - Tobacco use disorder - Cannabis abuse   PLAN: Safety and Monitoring:             -- Voluntary admission to inpatient psychiatric unit for safety, stabilization and treatment             -- Daily contact with patient to assess and evaluate symptoms and progress in treatment             -- Patient's case to be discussed in multi-disciplinary team meeting             -- Observation Level : q15 minute checks             -- Vital signs:  q12 hours             -- Precautions: suicide, elopement, and assault   2. Psychiatric Diagnoses and Treatment:  - Continue Prozac 60 mg once a day, for depression and anxiety -- The risks/benefits/side-effects/alternatives to this medication were discussed in detail with the patient and time was given for questions. The patient consents to medication trial.  -- Metabolic profile and EKG monitoring obtained while on an atypical antipsychotic (BMI: Lipid Panel: HbgA1c: QTc:)  -- Encouraged patient to participate in unit milieu and in scheduled group therapies  -- Short Term Goals: Ability to identify changes in lifestyle to reduce recurrence of condition will improve, Ability to verbalize feelings will improve, Ability to disclose and discuss suicidal ideas, Ability to  demonstrate self-control will improve, Ability to identify and develop effective coping behaviors will improve, Ability to maintain clinical measurements within normal limits will improve, Compliance with prescribed medications will improve, and Ability to identify triggers associated with substance abuse/mental health issues will improve -- Long Term Goals: Improvement in symptoms so as ready for discharge                3. Medical Issues Being Addressed:                Tobacco Use Disorder             --  Nicotine patch 21mg /24 hours ordered             -- Smoking cessation encouraged  Withdrawals/Seizures - Continue CIWA protocol - Continue Ativan 1 mg as needed every 2 hours - Continue Ativan 1.5 mg every 6 hours - Continue  Gabapentin 800 milligrams 4 times daily for today, with plans to taper down (see gabapentin taper plan below) - Continue Keppra 500 mg twice daily - Continue Norvasc 10 mg daily   Gabapentin Taper Plan Friday (06/23/2022): Gabapentin 800 mg 3 times daily Saturday (06/24/2022): Gabapentin 600 mg 3 times daily Sunday (06/25/2022): Gabapentin 400 mg 3 times daily Monday (06/26/2022): Gabapentin 300 mg 3 times daily Tuesday (06/27/2022): Gabapentin 200 mg 3 times daily Wednesday (06/28/2022): Gabapentin 100 mg 3 times daily  Hypertension - Continue clonidine 0.1 mg every 4 hours as needed, give 1 dose if systolic BP is over 180 and/or diastolic BP is over 100. - Continue Norvasc 5 mg daily  Hypokalemia - K+ (06/20/2022) 2.8 - Order potassium chloride tablet 40 mEq 2 times daily for 1 dose - K+ (06/21/2022): 3.3   4. Discharge Planning:              -- Social work and case management to assist with discharge planning and identification of hospital follow-up needs prior to discharge             -- Estimated LOS: 5-7 days             -- Discharge Concerns: Need to establish a safety plan; Medication compliance and effectiveness             -- Discharge Goals: Return  home with outpatient referrals for mental health follow-up including medication management/psychotherapy   06/23/2022, MD 06/22/2022, 6:33 AM

## 2022-06-22 NOTE — Progress Notes (Signed)
Patient rated her day as a 5 out of 10 because her medications were changed. She also states that her husband wants to divorce her.

## 2022-06-22 NOTE — Progress Notes (Signed)
D:  Patient's self inventory sheet, patient sleeps good, sleep medication helpful.  Good appetite, normal energy level, good concentration.  Rated depression and hopeless 3, anxiety 6.  Withdrawals, tremors, agitation, irritability.  Denied SI.  Physical problems, R hip, worst pain #5.  Goal be positive for myself.  Plans to reflect and focus.  No discharge plans. A:  Medications administered per MD orders. Emotional support and encouragement given patient. R:  Denied SI and HI, contracts for safety.  Denied A/V hallucinations.  Safety maintained with 15 minute checks.

## 2022-06-22 NOTE — Plan of Care (Signed)
Nurse discussed coping skills with patient.  

## 2022-06-23 ENCOUNTER — Inpatient Hospital Stay (HOSPITAL_COMMUNITY)
Admission: EM | Admit: 2022-06-23 | Discharge: 2022-06-23 | DRG: 894 | Discharge status: Against Medical Advice | Payer: Commercial Managed Care - PPO | Attending: Internal Medicine | Admitting: Internal Medicine

## 2022-06-23 ENCOUNTER — Observation Stay (HOSPITAL_COMMUNITY)
Admission: EM | Admit: 2022-06-23 | Discharge: 2022-06-26 | Disposition: A | Discharge status: Home / Self Care | Payer: Commercial Managed Care - PPO | Attending: Internal Medicine | Admitting: Internal Medicine

## 2022-06-23 ENCOUNTER — Other Ambulatory Visit: Payer: Self-pay

## 2022-06-23 ENCOUNTER — Inpatient Hospital Stay (HOSPITAL_COMMUNITY)
Admission: AD | Admit: 2022-06-23 | Payer: Commercial Managed Care - PPO | Source: Ambulatory Visit | Admitting: Internal Medicine

## 2022-06-23 DIAGNOSIS — Z818 Family history of other mental and behavioral disorders: Secondary | ICD-10-CM | POA: Diagnosis not present

## 2022-06-23 DIAGNOSIS — R45851 Suicidal ideations: Secondary | ICD-10-CM | POA: Diagnosis present

## 2022-06-23 DIAGNOSIS — F1020 Alcohol dependence, uncomplicated: Secondary | ICD-10-CM | POA: Diagnosis present

## 2022-06-23 DIAGNOSIS — E876 Hypokalemia: Secondary | ICD-10-CM | POA: Diagnosis present

## 2022-06-23 DIAGNOSIS — Z793 Long term (current) use of hormonal contraceptives: Secondary | ICD-10-CM

## 2022-06-23 DIAGNOSIS — Z96643 Presence of artificial hip joint, bilateral: Secondary | ICD-10-CM | POA: Diagnosis present

## 2022-06-23 DIAGNOSIS — I10 Essential (primary) hypertension: Secondary | ICD-10-CM | POA: Diagnosis present

## 2022-06-23 DIAGNOSIS — E440 Moderate protein-calorie malnutrition: Secondary | ICD-10-CM | POA: Insufficient documentation

## 2022-06-23 DIAGNOSIS — J302 Other seasonal allergic rhinitis: Secondary | ICD-10-CM | POA: Diagnosis present

## 2022-06-23 DIAGNOSIS — Z79891 Long term (current) use of opiate analgesic: Secondary | ICD-10-CM

## 2022-06-23 DIAGNOSIS — Z79899 Other long term (current) drug therapy: Secondary | ICD-10-CM | POA: Insufficient documentation

## 2022-06-23 DIAGNOSIS — J4520 Mild intermittent asthma, uncomplicated: Secondary | ICD-10-CM

## 2022-06-23 DIAGNOSIS — J45909 Unspecified asthma, uncomplicated: Secondary | ICD-10-CM | POA: Insufficient documentation

## 2022-06-23 DIAGNOSIS — F10931 Alcohol use, unspecified with withdrawal delirium: Principal | ICD-10-CM

## 2022-06-23 DIAGNOSIS — Z8249 Family history of ischemic heart disease and other diseases of the circulatory system: Secondary | ICD-10-CM | POA: Diagnosis not present

## 2022-06-23 DIAGNOSIS — F10239 Alcohol dependence with withdrawal, unspecified: Principal | ICD-10-CM | POA: Diagnosis present

## 2022-06-23 DIAGNOSIS — F331 Major depressive disorder, recurrent, moderate: Secondary | ICD-10-CM | POA: Diagnosis present

## 2022-06-23 DIAGNOSIS — F13239 Sedative, hypnotic or anxiolytic dependence with withdrawal, unspecified: Secondary | ICD-10-CM | POA: Diagnosis present

## 2022-06-23 DIAGNOSIS — F419 Anxiety disorder, unspecified: Secondary | ICD-10-CM | POA: Diagnosis present

## 2022-06-23 DIAGNOSIS — F10939 Alcohol use, unspecified with withdrawal, unspecified: Secondary | ICD-10-CM

## 2022-06-23 DIAGNOSIS — K219 Gastro-esophageal reflux disease without esophagitis: Secondary | ICD-10-CM | POA: Diagnosis present

## 2022-06-23 DIAGNOSIS — F10930 Alcohol use, unspecified with withdrawal, uncomplicated: Secondary | ICD-10-CM | POA: Diagnosis not present

## 2022-06-23 DIAGNOSIS — R569 Unspecified convulsions: Secondary | ICD-10-CM

## 2022-06-23 DIAGNOSIS — G40909 Epilepsy, unspecified, not intractable, without status epilepticus: Secondary | ICD-10-CM | POA: Diagnosis present

## 2022-06-23 DIAGNOSIS — F418 Other specified anxiety disorders: Secondary | ICD-10-CM | POA: Diagnosis present

## 2022-06-23 DIAGNOSIS — F17210 Nicotine dependence, cigarettes, uncomplicated: Secondary | ICD-10-CM | POA: Diagnosis present

## 2022-06-23 DIAGNOSIS — D539 Nutritional anemia, unspecified: Secondary | ICD-10-CM | POA: Diagnosis present

## 2022-06-23 DIAGNOSIS — Z720 Tobacco use: Secondary | ICD-10-CM | POA: Diagnosis present

## 2022-06-23 DIAGNOSIS — Z880 Allergy status to penicillin: Secondary | ICD-10-CM | POA: Diagnosis not present

## 2022-06-23 DIAGNOSIS — E782 Mixed hyperlipidemia: Secondary | ICD-10-CM | POA: Insufficient documentation

## 2022-06-23 DIAGNOSIS — D509 Iron deficiency anemia, unspecified: Secondary | ICD-10-CM | POA: Insufficient documentation

## 2022-06-23 LAB — URINALYSIS, ROUTINE W REFLEX MICROSCOPIC
Bilirubin Urine: NEGATIVE
Glucose, UA: NEGATIVE mg/dL
Hgb urine dipstick: NEGATIVE
Ketones, ur: NEGATIVE mg/dL
Nitrite: NEGATIVE
Protein, ur: NEGATIVE mg/dL
Specific Gravity, Urine: 1.006 (ref 1.005–1.030)
pH: 6 (ref 5.0–8.0)

## 2022-06-23 LAB — CBC WITH DIFFERENTIAL/PLATELET
Abs Immature Granulocytes: 0 10*3/uL (ref 0.00–0.07)
Basophils Absolute: 0 10*3/uL (ref 0.0–0.1)
Basophils Relative: 0 %
Eosinophils Absolute: 0.1 10*3/uL (ref 0.0–0.5)
Eosinophils Relative: 1 %
HCT: 37 % (ref 36.0–46.0)
Hemoglobin: 12 g/dL (ref 12.0–15.0)
Immature Granulocytes: 0 %
Lymphocytes Relative: 23 %
Lymphs Abs: 1.2 10*3/uL (ref 0.7–4.0)
MCH: 33.4 pg (ref 26.0–34.0)
MCHC: 32.4 g/dL (ref 30.0–36.0)
MCV: 103.1 fL — ABNORMAL HIGH (ref 80.0–100.0)
Monocytes Absolute: 0.4 10*3/uL (ref 0.1–1.0)
Monocytes Relative: 8 %
Neutro Abs: 3.4 10*3/uL (ref 1.7–7.7)
Neutrophils Relative %: 68 %
Platelets: 226 10*3/uL (ref 150–400)
RBC: 3.59 MIL/uL — ABNORMAL LOW (ref 3.87–5.11)
RDW: 17.7 % — ABNORMAL HIGH (ref 11.5–15.5)
WBC: 5 10*3/uL (ref 4.0–10.5)
nRBC: 0 % (ref 0.0–0.2)

## 2022-06-23 LAB — COMPREHENSIVE METABOLIC PANEL
ALT: 30 U/L (ref 0–44)
AST: 30 U/L (ref 15–41)
Albumin: 3.3 g/dL — ABNORMAL LOW (ref 3.5–5.0)
Alkaline Phosphatase: 85 U/L (ref 38–126)
Anion gap: 6 (ref 5–15)
BUN: <5 mg/dL — ABNORMAL LOW (ref 6–20)
CO2: 26 mmol/L (ref 22–32)
Calcium: 8.8 mg/dL — ABNORMAL LOW (ref 8.9–10.3)
Chloride: 105 mmol/L (ref 98–111)
Creatinine, Ser: 0.66 mg/dL (ref 0.44–1.00)
GFR, Estimated: >60 mL/min (ref >60)
Glucose, Bld: 85 mg/dL (ref 70–99)
Potassium: 3.7 mmol/L (ref 3.5–5.1)
Sodium: 137 mmol/L (ref 135–145)
Total Bilirubin: 0.6 mg/dL (ref 0.3–1.2)
Total Protein: 6.8 g/dL (ref 6.5–8.1)

## 2022-06-23 LAB — RAPID URINE DRUG SCREEN, HOSP PERFORMED
Amphetamines: NOT DETECTED
Barbiturates: NOT DETECTED
Benzodiazepines: POSITIVE — AB
Cocaine: NOT DETECTED
Opiates: NOT DETECTED
Tetrahydrocannabinol: POSITIVE — AB

## 2022-06-23 LAB — CBG MONITORING, ED: Glucose-Capillary: 83 mg/dL (ref 70–99)

## 2022-06-23 LAB — ETHANOL: Alcohol, Ethyl (B): <10 mg/dL (ref <10)

## 2022-06-23 MED ORDER — LORAZEPAM 2 MG/ML IJ SOLN: 0.0000 mg | Freq: Four times a day (QID) | INTRAMUSCULAR | Status: DC

## 2022-06-23 MED ORDER — GABAPENTIN 100 MG PO CAPS
200.0000 mg | ORAL_CAPSULE | Freq: Three times a day (TID) | ORAL | Status: DC
Start: 2022-06-27 — End: 2022-06-23

## 2022-06-23 MED ORDER — ONDANSETRON 4 MG PO TBDP: 4.0000 mg | ORAL_TABLET | Freq: Four times a day (QID) | ORAL | Status: DC | PRN

## 2022-06-23 MED ORDER — LORAZEPAM 2 MG/ML IJ SOLN: 1.0000 mg | INTRAMUSCULAR | Status: DC | PRN

## 2022-06-23 MED ORDER — FLUOXETINE HCL 20 MG PO CAPS
80.0000 mg | ORAL_CAPSULE | Freq: Every day | ORAL | Status: DC
  Filled 2022-06-23 (×2): qty 4

## 2022-06-23 MED ORDER — ONDANSETRON HCL 4 MG PO TABS: 4.0000 mg | ORAL_TABLET | Freq: Four times a day (QID) | ORAL | Status: DC | PRN

## 2022-06-23 MED ORDER — LOPERAMIDE HCL 2 MG PO CAPS: 2.0000 mg | ORAL_CAPSULE | ORAL | Status: DC | PRN

## 2022-06-23 MED ORDER — ACETAMINOPHEN 650 MG RE SUPP: 650.0000 mg | Freq: Four times a day (QID) | RECTAL | Status: DC | PRN

## 2022-06-23 MED ORDER — ACETAMINOPHEN 325 MG PO TABS: 650.0000 mg | ORAL_TABLET | Freq: Four times a day (QID) | ORAL | Status: DC | PRN

## 2022-06-23 MED ORDER — LORAZEPAM 2 MG/ML IJ SOLN
1.0000 mg | INTRAMUSCULAR | Status: DC | PRN
  Administered 2022-06-24 (×2): 1 mg via INTRAVENOUS
  Filled 2022-06-23 (×2): qty 1

## 2022-06-23 MED ORDER — FOLIC ACID 1 MG PO TABS: 1.0000 mg | ORAL_TABLET | Freq: Every day | ORAL | Status: DC

## 2022-06-23 MED ORDER — LORAZEPAM 0.5 MG PO TABS
0.5000 mg | ORAL_TABLET | Freq: Three times a day (TID) | ORAL | Status: DC
Start: 2022-06-26 — End: 2022-06-23

## 2022-06-23 MED ORDER — THIAMINE HCL 100 MG/ML IJ SOLN: 100.0000 mg | Freq: Every day | INTRAMUSCULAR | Status: DC

## 2022-06-23 MED ORDER — NICOTINE 14 MG/24HR TD PT24
14.0000 mg | MEDICATED_PATCH | Freq: Every day | TRANSDERMAL | 0 refills | Status: DC
Start: 2022-06-24 — End: 2022-06-29

## 2022-06-23 MED ORDER — LORAZEPAM 1 MG PO TABS: 0.0000 mg | ORAL_TABLET | Freq: Three times a day (TID) | ORAL | Status: DC

## 2022-06-23 MED ORDER — LORAZEPAM 1 MG PO TABS
1.0000 mg | ORAL_TABLET | Freq: Three times a day (TID) | ORAL | Status: DC
  Filled 2022-06-23: qty 1

## 2022-06-23 MED ORDER — LEVETIRACETAM IN NACL 1000 MG/100ML IV SOLN
1000.0000 mg | Freq: Once | INTRAVENOUS | Status: AC
  Administered 2022-06-23: 1000 mg via INTRAVENOUS
  Filled 2022-06-23: qty 100

## 2022-06-23 MED ORDER — LORAZEPAM 1 MG PO TABS: 1.0000 mg | ORAL_TABLET | Freq: Four times a day (QID) | ORAL | Status: DC | PRN

## 2022-06-23 MED ORDER — GABAPENTIN 400 MG PO CAPS
800.0000 mg | ORAL_CAPSULE | Freq: Three times a day (TID) | ORAL | Status: DC
Start: 2022-06-23 — End: 2022-08-28

## 2022-06-23 MED ORDER — LORAZEPAM 2 MG/ML IJ SOLN: 0.0000 mg | Freq: Two times a day (BID) | INTRAMUSCULAR | Status: DC

## 2022-06-23 MED ORDER — LORAZEPAM 1 MG PO TABS: 1.0000 mg | ORAL_TABLET | ORAL | Status: DC | PRN

## 2022-06-23 MED ORDER — ADULT MULTIVITAMIN W/MINERALS CH
1.0000 | ORAL_TABLET | Freq: Every day | ORAL | Status: DC
  Administered 2022-06-23: 1 via ORAL
  Filled 2022-06-23: qty 1

## 2022-06-23 MED ORDER — LORAZEPAM 1 MG PO TABS
0.0000 mg | ORAL_TABLET | ORAL | Status: DC
  Filled 2022-06-23: qty 4

## 2022-06-23 MED ORDER — GABAPENTIN 100 MG PO CAPS
100.0000 mg | ORAL_CAPSULE | Freq: Three times a day (TID) | ORAL | Status: DC
Start: 2022-06-28 — End: 2022-06-23

## 2022-06-23 MED ORDER — TRAZODONE HCL 100 MG PO TABS: 50.0000 mg | ORAL_TABLET | Freq: Every evening | ORAL | Status: DC | PRN

## 2022-06-23 MED ORDER — TRAZODONE HCL 50 MG PO TABS: 50.0000 mg | ORAL_TABLET | Freq: Every evening | ORAL | Status: DC | PRN

## 2022-06-23 MED ORDER — AMLODIPINE BESYLATE 10 MG PO TABS
10.0000 mg | ORAL_TABLET | Freq: Every day | ORAL | Status: DC
Start: 2022-06-24 — End: 2022-06-26

## 2022-06-23 MED ORDER — LORAZEPAM 0.5 MG PO TABS: 0.5000 mg | ORAL_TABLET | Freq: Two times a day (BID) | ORAL | Status: DC

## 2022-06-23 MED ORDER — FLUOXETINE HCL 20 MG PO CAPS: 60.0000 mg | ORAL_CAPSULE | Freq: Every day | ORAL | Status: DC

## 2022-06-23 MED ORDER — GABAPENTIN 300 MG PO CAPS
600.0000 mg | ORAL_CAPSULE | Freq: Three times a day (TID) | ORAL | Status: DC
  Filled 2022-06-23 (×3): qty 2

## 2022-06-23 MED ORDER — LEVETIRACETAM 500 MG PO TABS
500.0000 mg | ORAL_TABLET | Freq: Two times a day (BID) | ORAL | Status: DC
  Filled 2022-06-23: qty 1

## 2022-06-23 MED ORDER — FOLIC ACID 1 MG PO TABS
1.0000 mg | ORAL_TABLET | Freq: Every day | ORAL | Status: DC
  Administered 2022-06-23: 1 mg via ORAL
  Filled 2022-06-23: qty 1

## 2022-06-23 MED ORDER — HYDROXYZINE HCL 25 MG PO TABS
25.0000 mg | ORAL_TABLET | Freq: Four times a day (QID) | ORAL | Status: DC | PRN
  Administered 2022-06-23: 25 mg via ORAL
  Filled 2022-06-23: qty 1

## 2022-06-23 MED ORDER — LORAZEPAM 1 MG PO TABS
1.0000 mg | ORAL_TABLET | ORAL | Status: DC | PRN
  Administered 2022-06-25: 1 mg via ORAL
  Filled 2022-06-23: qty 1

## 2022-06-23 MED ORDER — THIAMINE HCL 100 MG PO TABS
100.0000 mg | ORAL_TABLET | Freq: Every day | ORAL | Status: DC
  Administered 2022-06-23: 100 mg via ORAL
  Filled 2022-06-23: qty 1

## 2022-06-23 MED ORDER — GABAPENTIN 300 MG PO CAPS
300.0000 mg | ORAL_CAPSULE | Freq: Three times a day (TID) | ORAL | Status: DC
Start: 2022-06-26 — End: 2022-06-23

## 2022-06-23 MED ORDER — ADULT MULTIVITAMIN W/MINERALS CH: 1.0000 | ORAL_TABLET | Freq: Every day | ORAL | Status: DC

## 2022-06-23 MED ORDER — LORAZEPAM 2 MG/ML IJ SOLN: 2.0000 mg | Freq: Once | INTRAMUSCULAR | Status: DC

## 2022-06-23 MED ORDER — LORAZEPAM 1 MG PO TABS
1.0000 mg | ORAL_TABLET | Freq: Four times a day (QID) | ORAL | Status: DC
  Filled 2022-06-23: qty 1

## 2022-06-23 MED ORDER — THIAMINE HCL 100 MG PO TABS: 100.0000 mg | ORAL_TABLET | Freq: Every day | ORAL | Status: DC

## 2022-06-23 MED ORDER — GABAPENTIN 300 MG PO CAPS
800.0000 mg | ORAL_CAPSULE | Freq: Three times a day (TID) | ORAL | Status: DC
  Administered 2022-06-23: 800 mg via ORAL
  Filled 2022-06-23 (×2): qty 2

## 2022-06-23 MED ORDER — IPRATROPIUM-ALBUTEROL 20-100 MCG/ACT IN AERS: 2.0000 | INHALATION_SPRAY | Freq: Four times a day (QID) | RESPIRATORY_TRACT | Status: DC | PRN

## 2022-06-23 MED ORDER — LORAZEPAM 2 MG/ML IJ SOLN
2.0000 mg | Freq: Once | INTRAMUSCULAR | Status: AC
  Administered 2022-06-23: 2 mg via INTRAVENOUS

## 2022-06-23 MED ORDER — GABAPENTIN 100 MG PO CAPS
400.0000 mg | ORAL_CAPSULE | Freq: Three times a day (TID) | ORAL | Status: DC
  Filled 2022-06-23: qty 1

## 2022-06-23 MED ORDER — GABAPENTIN 300 MG PO CAPS: 800.0000 mg | ORAL_CAPSULE | Freq: Three times a day (TID) | ORAL | Status: DC

## 2022-06-23 MED ORDER — IPRATROPIUM-ALBUTEROL 0.5-2.5 (3) MG/3ML IN SOLN: 3.0000 mL | Freq: Four times a day (QID) | RESPIRATORY_TRACT | Status: DC | PRN

## 2022-06-23 MED ORDER — ONDANSETRON HCL 4 MG/2ML IJ SOLN: 4.0000 mg | Freq: Four times a day (QID) | INTRAMUSCULAR | Status: DC | PRN

## 2022-06-23 NOTE — ED Notes (Signed)
Went into patient's room to give her a new armband and her IV was in the sink and she was gone along with her clothes and belongings, provider notified.

## 2022-06-23 NOTE — Group Note (Signed)
LCSW Group Therapy Note   Group Date: 06/23/2022 Start Time: 1300 End Time: 1400  Type of Therapy and Topic: Group Therapy: Control  Participation Level: Did Not Attend  Description of Group: In this group patients will discuss what is out of their control, what is somewhat in their control, and what is within their control.  They will be encouraged to explore what issues they can control and what issues are out of their control within their daily lives. They will be guided to discuss their thoughts, feelings, and behaviors related to these issues. The group will process together ways to better control things that are well within our own control and how to notice and accept the things that are not within our control. This group will be process-oriented, with patients participating in exploration of their own experiences as well as giving and receiving support and challenge from other group members.  During this group 2 worksheets will be provided to each patient to follow along and fill out.   Therapeutic Goals: 1. Patient will identify what is within their control and what is not within their control. 2. Patient will identify their thoughts and feelings about having control over their own lives. 3. Patient will identify their thoughts and feelings about not having control over everything in their lives.. 4. Patient will identify ways that they can have more control over their own lives. 5. Patient will identify areas were they can allow others to help them or provide assistance.  Summary of Patient Progress: Did not attend    Aram Beecham, LCSWA 06/23/2022  2:15 PM

## 2022-06-23 NOTE — ED Provider Notes (Signed)
Signed                                                                                                                                                                                                                                                                                                                                    [facility]-EMERGENCY DEPT Provider Note     CSN: [id number] Arrival date & time: 06/23/22  1417     History       Chief Complaint  Patient presents with   Alcohol Problem      ETOH Use disorder   Seizures      Kelsey [name] is a 41 y.o. female.   Patient brought from behavioral health hospital with seizures and concern for alcohol withdrawal.  She was being seen for alcohol and Xanax withdrawal and suicidal ideation since July 25.  She reports her last drink was 6 days ago last dose of Xanax was 5 days ago.  At behavioral health she had a seizure lasting approximately 2 minutes and was given IV Ativan.  On arrival to [place] [facility] she had another seizure lasting approximately 1 minute and was given 2 additional milligrams of Ativan.  She does have a history of seizure disorder and apparently takes Keppra. No known fevers.  No chest pain, shortness of breath, nausea or vomiting.  No other illicit drug use.   On arrival patient had tremors of her face and hands and stiffening of her arms and legs.  Did blink to threat however and resist raising of her arms.   The history is provided by the patient and the EMS personnel.  Alcohol Problem   Seizures       Home Medications        Prior to Admission medications   Medication Sig Start Date End Date Taking? Authorizing Provider  albuterol (VENTOLIN HFA) 108 (90 Base) MCG/ACT inhaler Inhale 1-2  puffs into the lungs every 6 (six) hours as needed for wheezing or shortness of breath.     Yes  [provider]  cyclobenzaprine (FLEXERIL) 10 MG tablet Take 1 tablet (10 mg total) by mouth 3 (three) times daily as needed for muscle spasms. 05/05/22   Yes Kathryne Hitch, MD  diphenhydrAMINE (BENADRYL) 25 MG tablet Take 25 mg by mouth every 6 (six) hours as needed for allergies.     Yes [provider]  FLUoxetine (PROZAC) 20 MG capsule Take 60 mg by mouth daily. Patient stated on 60mg  daily, prescriber considering increase to 80mg      Yes [provider]  ALPRAZolam (XANAX) 1 MG tablet Take 1 tablet (1 mg total) by mouth at bedtime as needed for anxiety. Patient taking differently: Take 1 mg by mouth 3 (three) times daily. 10/02/20     , MD  amLODipine (NORVASC) 10 MG tablet Take 1 tablet (10 mg total) by mouth daily. 06/24/22     Massengill, Devoria Albe, MD  gabapentin (NEURONTIN) 300 MG capsule TAKE 2 CAPSULES (600 MG TOTAL) BY MOUTH 3 (THREE) TIMES DAILY. Patient taking differently: Take 1,200 mg by mouth 4 (four) times daily. 05/03/18     McLean-Scocuzza, Harrold Donath, MD  gabapentin (NEURONTIN) 400 MG capsule Take 2 capsules (800 mg total) by mouth 3 (three) times daily. 06/23/22     Massengill, Pasty Spillers, MD  HYDROcodone-acetaminophen (NORCO) 7.5-325 MG tablet Take 1 tablet by mouth every 6 (six) hours as needed for moderate pain. 05/18/22     03-27-1978, MD  levETIRAcetam (KEPPRA) 500 MG tablet Take 1 tablet (500 mg total) by mouth 2 (two) times daily. 01/20/22     Kathryne Hitch, MD  levonorgestrel (MIRENA) 20 MCG/24HR IUD 1 Intra Uterine Device (1 each total) by Intrauterine route once. 07/16/15     Catarina Hartshorn, NP  nicotine (NICODERM CQ - DOSED IN MG/24 HOURS) 14 mg/24hr patch Place 1 patch (14 mg total) onto the skin daily at 6 (six) AM. 06/24/22     Massengill, Adonis Brook, MD  traZODone (DESYREL) 50 MG tablet Take 1 tablet (50 mg total) by mouth at bedtime as needed for sleep. 06/23/22     Massengill, Harrold Donath, MD       Allergies            Penicillins      Review of Systems   Review of Systems  Neurological:  Positive for seizures.      Physical Exam Updated Vital Signs BP (!) 123/97 (BP Location: Right Arm)   Pulse (!) 101   Temp (!) 97.5 F (36.4 C) (Oral)   Resp 16   Ht 5\' 5"  (1.651 m)   Wt 63.5 kg   SpO2 97%   BMI 23.30 kg/m  Physical Exam Vitals and nursing note reviewed.  Constitutional:      General: She is not in acute distress.    Appearance: She is well-developed.  HENT:     Head: Normocephalic and atraumatic.     Mouth/Throat:     Pharynx: No oropharyngeal exudate.  Eyes:     Conjunctiva/sclera: Conjunctivae normal.     Pupils: Pupils are equal, round, and reactive to light.  Neck:     Comments: No meningismus. Cardiovascular:     Rate and Rhythm: Normal rate and regular rhythm.     Heart sounds: Normal heart sounds. No murmur heard. Pulmonary:     Effort: Pulmonary effort is normal. No respiratory distress.  Breath sounds: Normal breath sounds.  Abdominal:     Palpations: Abdomen is soft.     Tenderness: There is no abdominal tenderness. There is no guarding or rebound.  Musculoskeletal:        General: No tenderness. Normal range of motion.     Cervical back: Normal range of motion and neck supple.  Skin:    General: Skin is warm.  Neurological:     Mental Status: She is alert and oriented to person, place, and time.     Cranial Nerves: No cranial nerve deficit.     Motor: No abnormal muscle tone.     Coordination: Coordination normal.     Comments: Patient was initially obtunded and not following commands.  She had stiffening of her arms and legs with tremors.  She did however blink to threat and resist holding her arms up against gravity.   After seizure activity has stopped she has equal strength throughout and able to hold arms and legs off the bed bilaterally  Psychiatric:        Behavior: Behavior normal.        ED Results / Procedures / Treatments   Labs (all labs ordered are  listed, but only abnormal results are displayed)      Labs Reviewed  CBC - Abnormal; Notable for the following components:      Result Value     MCV 101.5 (*)      RDW 17.8 (*)      All other components within normal limits  COMPREHENSIVE METABOLIC PANEL - Abnormal; Notable for the following components:    Potassium 2.8 (*)      Calcium 8.6 (*)      Albumin 3.2 (*)      AST 48 (*)      ALT 53 (*)      All other components within normal limits  LIPID PANEL - Abnormal; Notable for the following components:    HDL 33 (*)      All other components within normal limits  RAPID URINE DRUG SCREEN, HOSP PERFORMED - Abnormal; Notable for the following components:    Benzodiazepines POSITIVE (*)      Tetrahydrocannabinol POSITIVE (*)      All other components within normal limits  COMPREHENSIVE METABOLIC PANEL - Abnormal; Notable for the following components:    Potassium 3.3 (*)      Calcium 8.8 (*)      Albumin 3.4 (*)      ALT 45 (*)      All other components within normal limits  BASIC METABOLIC PANEL - Abnormal; Notable for the following components:    Calcium 8.4 (*)      All other components within normal limits  CBC WITH DIFFERENTIAL/PLATELET - Abnormal; Notable for the following components:    RBC 3.59 (*)      MCV 103.1 (*)      RDW 17.7 (*)      All other components within normal limits  COMPREHENSIVE METABOLIC PANEL - Abnormal; Notable for the following components:    BUN <5 (*)      Calcium 8.8 (*)      Albumin 3.3 (*)      All other components within normal limits  RAPID URINE DRUG SCREEN, HOSP PERFORMED - Abnormal; Notable for the following components:    Benzodiazepines POSITIVE (*)      Tetrahydrocannabinol POSITIVE (*)      All other components within normal limits  URINALYSIS, ROUTINE  W REFLEX MICROSCOPIC - Abnormal; Notable for the following components:    APPearance HAZY (*)      Leukocytes,Ua MODERATE (*)      Bacteria, UA RARE (*)      All other components  within normal limits  SARS CORONAVIRUS 2 BY RT PCR  ETHANOL  TSH  PREGNANCY, URINE  ETHANOL  CBG MONITORING, ED      EKG None   Radiology No results found.   Procedures .Critical Care   Performed by: Glynn Octave, MD Authorized by: Glynn Octave, MD   Critical care provider statement:    Critical care time (minutes):  35   Critical care time was exclusive of:  Separately billable procedures and treating other patients   Critical care was necessary to treat or prevent imminent or life-threatening deterioration of the following conditions:  CNS failure or compromise   Critical care was time spent personally by me on the following activities:  Development of treatment plan with patient or surrogate, discussions with consultants, evaluation of patient's response to treatment, examination of patient, ordering and review of laboratory studies, ordering and review of radiographic studies, ordering and performing treatments and interventions, pulse oximetry, re-evaluation of patient's condition and review of old charts   I assumed direction of critical care for this patient from another provider in my specialty: no     Care discussed with: admitting provider         Medications Ordered in ED Medications  acetaminophen (TYLENOL) tablet 650 mg (has no administration in time range)  alum & mag hydroxide-simeth (MAALOX/MYLANTA) 200-200-20 MG/5ML suspension 30 mL (has no administration in time range)  magnesium hydroxide (MILK OF MAGNESIA) suspension 30 mL (has no administration in time range)  thiamine (B-1) injection 100 mg (100 mg Intramuscular Not Given 06/20/22 0142)  thiamine tablet 100 mg (100 mg Oral Given 06/23/22 0900)  multivitamin with minerals tablet 1 tablet (1 tablet Oral Given 06/23/22 0900)  LORazepam (ATIVAN) tablet 1 mg (1 mg Oral Given 06/20/22 0856)  hydrOXYzine (ATARAX) tablet 25 mg (25 mg Oral Given 06/22/22 2111)  loperamide (IMODIUM) capsule 2-4 mg (has no  administration in time range)  ondansetron (ZOFRAN-ODT) disintegrating tablet 4 mg (has no administration in time range)  nicotine (NICODERM CQ - dosed in mg/24 hours) patch 14 mg (14 mg Transdermal Not Given 06/23/22 0900)  levETIRAcetam (KEPPRA) tablet 500 mg (500 mg Oral Given 06/23/22 0900)  cyclobenzaprine (FLEXERIL) tablet 10 mg (10 mg Oral Given 06/22/22 2111)  albuterol (VENTOLIN HFA) 108 (90 Base) MCG/ACT inhaler 2 puff (2 puffs Inhalation Given 06/23/22 1146)  LORazepam (ATIVAN) injection 2 mg (2 mg Intramuscular Given 06/23/22 1344)  traZODone (DESYREL) tablet 50 mg (50 mg Oral Given 06/22/22 2111)  cloNIDine (CATAPRES) tablet 0.1 mg (0.1 mg Oral Given 06/23/22 0917)  feeding supplement (ENSURE ENLIVE / ENSURE PLUS) liquid 237 mL (237 mLs Oral Patient Refused/Not Given 06/22/22 2206)  feeding supplement (BOOST / RESOURCE BREEZE) liquid 1 Container (1 Container Oral Given 06/23/22 1007)  amLODipine (NORVASC) tablet 10 mg (10 mg Oral Given 06/23/22 0900)  LORazepam (ATIVAN) tablet 1 mg (has no administration in time range)  hydrOXYzine (ATARAX) tablet 25 mg (25 mg Oral Given 06/23/22 1149)  loperamide (IMODIUM) capsule 2-4 mg (has no administration in time range)  ondansetron (ZOFRAN-ODT) disintegrating tablet 4 mg (has no administration in time range)  gabapentin (NEURONTIN) capsule 800 mg (800 mg Oral Given 06/23/22 1147)  gabapentin (NEURONTIN) capsule 600 mg (has no administration in time range)  Followed by  gabapentin (NEURONTIN) capsule 400 mg (has no administration in time range)    Followed by  gabapentin (NEURONTIN) capsule 300 mg (has no administration in time range)    Followed by  gabapentin (NEURONTIN) capsule 200 mg (has no administration in time range)    Followed by  gabapentin (NEURONTIN) capsule 100 mg (has no administration in time range)  FLUoxetine (PROZAC) capsule 80 mg (has no administration in time range)  LORazepam (ATIVAN) tablet 1 mg (has no administration in  time range)  LORazepam (ATIVAN) tablet 1 mg (has no administration in time range)    Followed by  LORazepam (ATIVAN) tablet 0.5 mg (has no administration in time range)    Followed by  LORazepam (ATIVAN) tablet 0.5 mg (has no administration in time range)  LORazepam (ATIVAN) tablet 1-4 mg (has no administration in time range)    Or  LORazepam (ATIVAN) injection 1-4 mg (has no administration in time range)  thiamine (VITAMIN B1) tablet 100 mg (has no administration in time range)    Or  thiamine (VITAMIN B1) injection 100 mg (has no administration in time range)  folic acid (FOLVITE) tablet 1 mg (has no administration in time range)  multivitamin with minerals tablet 1 tablet (has no administration in time range)  LORazepam (ATIVAN) injection 0-4 mg (has no administration in time range)    Followed by  LORazepam (ATIVAN) injection 0-4 mg (has no administration in time range)  levETIRAcetam (KEPPRA) IVPB 1000 mg/100 mL premix (has no administration in time range)  gabapentin (NEURONTIN) capsule 800 mg (800 mg Oral Given 06/22/22 2111)  diazepam (VALIUM) tablet 5 mg (5 mg Oral Given 06/20/22 1401)  white petrolatum (VASELINE) gel (  Given 06/20/22 1406)  potassium chloride SA (KLOR-CON M) CR tablet 40 mEq (40 mEq Oral Given 06/20/22 1815)  potassium chloride SA (KLOR-CON M) CR tablet 40 mEq (40 mEq Oral Given 06/21/22 8546)      ED Course/ Medical Decision Making/ A&P                         Medical Decision Making Amount and/or Complexity of Data Reviewed Labs: ordered. ECG/medicine tests: ordered.   Risk OTC drugs. Prescription drug management.     From [facility] with seizure acitivity. Hx alcohol and xanax withdrawal as well as seizure disorder on keppra.    Seizing on arrival, ativan IV given. Keppra loading dose.  Labs with hypokalemia.    Concern for DTs. Continue CIWA protocol. Ativan, thiamine. Folate.    Seizure activity resolved. Protecting airway.  Given recurrent seizure  and concern for DTs, admission d/w Dr. Robb Matar.                Final Clinical Impression(s) / ED Diagnoses Final diagnoses:  Alcohol use disorder, severe, dependence (HCC)      Rx / DC Orders ED Discharge Orders             Ordered      amLODipine (NORVASC) 10 MG tablet  Daily        06/23/22 1356      nicotine (NICODERM CQ - DOSED IN MG/24 HOURS) 14 mg/24hr patch  Daily        06/23/22 1356      traZODone (DESYREL) 50 MG tablet  At bedtime PRN        06/23/22 1356      gabapentin (NEURONTIN) 400 MG capsule  3 times daily  06/23/22 1356      Increase activity slowly        06/23/22 1356      Diet - low sodium heart healthy        06/23/22 1356                    Milania Haubner, Jeannett Senior, MD 06/23/22 2113         Glynn Octave, MD 06/23/22 2117

## 2022-06-23 NOTE — ED Triage Notes (Addendum)
Pt sent back from [facility] for medical clearance. She left here AMA earlier to go over there. Labs done earlier today.

## 2022-06-23 NOTE — ED Triage Notes (Signed)
Pt BIBA from [facility]. Pt was being treated for ETOH, and xanax withdrawals. Pt experienced seizure lasting approx 2 min, and was given 2 mg ativan IM by [facility] staff. Pt experienced another seizure lasting 1.5 min and was given 2 mg ativan IV.  Pt alert and oriented on arrival.  20 g in left hand 

## 2022-06-23 NOTE — ED Provider Notes (Incomplete)
[facility]-EMERGENCY DEPT Provider Note   CSN: [id number] Arrival date & time: 06/23/22  2215     History {Add pertinent medical, surgical, social history, OB history to HPI:1} Chief Complaint  Patient presents with  . Medical Clearance    Kelsey [name] is a 41 y.o. female.  HPI  41 year old female presents emergency department after being seen earlier today for alcohol withdrawal seizure.  She left AGAINST MEDICAL ADVICE went back to behavioral urgent care where she was then sent back to the emergency department for medical clearance.  She states she left because no one updated her on her course of treatment or disposition while in the emergency department.   She has been seen at the behavioral health hospital since Tuesday of this week for concerns of alcohol and Xanax withdrawal as well as suicidal ideation.  Today, she had 2 seizures while at the behavioral health urgent care was given Ativan.  Upon arrival to [facility], patient had another seizure lasting approximately 1 minute.  She has a history of seizure disorder and states she also has a history of alcohol withdrawal seizures.  Admission was set up  Home Medications Prior to Admission medications   Medication Sig Start Date End Date Taking? Authorizing Provider  albuterol (VENTOLIN HFA) 108 (90 Base) MCG/ACT inhaler Inhale 1-2 puffs into the lungs every 6 (six) hours as needed for wheezing or shortness of breath.    [provider]  ALPRAZolam Prudy Feeler) 1 MG tablet Take 1 tablet (1 mg total) by mouth at bedtime as needed for anxiety. Patient not taking: Reported on 06/23/2022 10/02/20   Devoria Albe, MD  amLODipine (NORVASC) 10 MG tablet Take 1 tablet (10 mg total) by mouth daily. 06/24/22   Massengill, Harrold Donath, MD  cyclobenzaprine (FLEXERIL) 10 MG tablet Take 1 tablet (10 mg total) by mouth 3 (three) times daily as needed for muscle spasms. 05/05/22   Kathryne Hitch, MD  diphenhydrAMINE  (BENADRYL) 25 MG tablet Take 25 mg by mouth every 6 (six) hours as needed for allergies.    [provider]  FLUoxetine (PROZAC) 20 MG capsule Take 80 mg by mouth daily.    [provider]  gabapentin (NEURONTIN) 300 MG capsule TAKE 2 CAPSULES (600 MG TOTAL) BY MOUTH 3 (THREE) TIMES DAILY. Patient not taking: Reported on 06/23/2022 05/03/18   McLean-Scocuzza, Pasty Spillers, MD  gabapentin (NEURONTIN) 400 MG capsule Take 2 capsules (800 mg total) by mouth 3 (three) times daily. 06/23/22   Massengill, Harrold Donath, MD  hydrALAZINE (APRESOLINE) 25 MG tablet Take 25 mg by mouth 3 (three) times daily.    [provider]  HYDROcodone-acetaminophen (NORCO) 7.5-325 MG tablet Take 1 tablet by mouth every 6 (six) hours as needed for moderate pain. Patient not taking: Reported on 06/23/2022 05/18/22   Kathryne Hitch, MD  levETIRAcetam (KEPPRA) 500 MG tablet Take 1 tablet (500 mg total) by mouth 2 (two) times daily. 01/20/22   Catarina Hartshorn, MD  levonorgestrel (MIRENA) 20 MCG/24HR IUD 1 Intra Uterine Device (1 each total) by Intrauterine route once. 07/16/15   Adonis Brook, NP  nicotine (NICODERM CQ - DOSED IN MG/24 HOURS) 14 mg/24hr patch Place 1 patch (14 mg total) onto the skin daily at 6 (six) AM. 06/24/22   Massengill, Harrold Donath, MD  traZODone (DESYREL) 50 MG tablet Take 1 tablet (50 mg total) by mouth at bedtime as needed for sleep. Patient taking differently: Take 50 mg by mouth at bedtime. 06/23/22   Massengill,  Harrold Donath, MD      Allergies    Penicillins    Review of Systems   Review of Systems  Physical Exam Updated Vital Signs BP 129/90 (BP Location: Left Arm)   Pulse (!) 113   Temp 98.6 F (37 C) (Oral)   Resp 17   Ht 5\' 5"  (1.651 m)   Wt 63 kg   SpO2 93%   BMI 23.13 kg/m  Physical Exam  ED Results / Procedures / Treatments   Labs (all labs ordered are listed, but only abnormal results are displayed) Labs Reviewed - No data to display  EKG None  Radiology No  results found.  Procedures Procedures  {Document cardiac monitor, telemetry assessment procedure when appropriate:1}  Medications Ordered in ED Medications  LORazepam (ATIVAN) tablet 1-4 mg (has no administration in time range)    Or  LORazepam (ATIVAN) injection 1-4 mg (has no administration in time range)    ED Course/ Medical Decision Making/ A&P                           Medical Decision Making Risk Prescription drug management.   ***  {Document critical care time when appropriate:1} {Document review of labs and clinical decision tools ie heart score, Chads2Vasc2 etc:1}  {Document your independent review of radiology images, and any outside records:1} {Document your discussion with family members, caretakers, and with consultants:1} {Document social determinants of health affecting pt's care:1} {Document your decision making why or why not admission, treatments were needed:1} Final Clinical Impression(s) / ED Diagnoses Final diagnoses:  None    Rx / DC Orders ED Discharge Orders     None
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
## 2022-06-23 NOTE — Group Note (Signed)
Recreation Therapy Group Note   Group Topic:Team Building  Group Date: 06/23/2022 Start Time: 0930 End Time: 0955 Facilitators: Caroll Rancher, LRT,CTRS Location: [address]   Recreation Therapy Notes  Goal Area(s) Addresses:  Patient will effectively work with peer towards shared goal.  Patient will identify skills used to make activity successful.  Patient will identify how skills used during activity can be applied to reach post d/c goals.   Group Description:  [employer]. In teams of 5-6, patients were given 11 craft pipe cleaners. Using the materials provided, patients were instructed to compete again the opposing team(s) to build the tallest free-standing structure from floor level. The activity was timed; difficulty increased by Clinical research associate as Production designer, theatre/television/film continued.  Systematically resources were removed with additional directions for example, placing one arm behind their back, working in silence, and shape stipulations. LRT facilitated post-activity discussion reviewing team processes and necessary communication skills involved in completion. Patients were encouraged to reflect how the skills utilized, or not utilized, in this activity can be incorporated to positively impact support systems post discharge.   Affect/Mood: Appropriate   Participation Level: Engaged   Participation Quality: Independent   Behavior: Appropriate   Speech/Thought Process: Focused   Insight: Good   Judgement: Good   Modes of Intervention: STEM Activity   Patient Response to Interventions:  Engaged   Education Outcome:  Acknowledges education and In group clarification offered    Clinical Observations/Individualized Feedback: Pt attended and participated in group.    Plan: Continue to engage patient in RT group sessions 2-3x/week.   Caroll Rancher, Antonietta Jewel 06/23/2022 12:54 PM

## 2022-06-23 NOTE — [facility] Suicide Risk Assessment (Addendum)
Suicide Risk Assessment  Discharge Assessment    [facility] Discharge Suicide Risk Assessment   Principal Problem: MDD (major depressive disorder), recurrent severe, without psychosis (HCC) Discharge Diagnoses: Principal Problem:   MDD (major depressive disorder), recurrent severe, without psychosis (HCC) Active Problems:   Alcohol use disorder, severe, dependence (HCC)   Tobacco use disorder   Cannabis abuse  Total Time spent with patient: 15 minutes  Patient is a 41 year old female with a past psychiatric history of alcohol use disorder with withdrawal seizures, major depressive disorder who was voluntarily admitted to [facility] on 06/20/22  due to worsening depression and suicidal ideation with a plan to overdose on Xanax and alcohol.  Patient had a suicide attempt 15 days ago by taking a handful of hydrocodone pills and Xanax.   Hospital Course:  At approximately 1:30 PM on 06-23-2022, patient began seizing and  Ativan 2 mg IM was administered at 1:38 PM.  [facility] ED providers was contacted immediately by the attending physician, and patient was transferred to [facility] for continued management of her seizure in the context of her benzo and alcohol withdrawals - she requires medical admission for remainder of alcohol and benzo w/d. Patient has a history of medication non-compliance to Keppra for the last 3 months and has taken Xanax TID for several years.    Psychiatric diagnoses provided upon initial assessment:  - Major depressive disorder, recurrent severe, without psychosis -- Generalized anxiety disorder - Alcohol use disorder, severe, dependence - Tobacco use disorder - Cannabis abuse -  Benzodiazepine use, chronic   Patient's medications were adjusted on admission:  -Start Prozac 60 mg once a day, for depression and anxiety - Start CIWA protocol  - Start Ativan 1 mg as needed every 2 hours - Start Ativan 2 mg every 6 hours - Start  Gabapentin 800 milligrams 4 times daily - Start Valium 5 mg for 1 dose - Start Keppra 500 mg twice daily - Start Norvasc 5 mg daily - Start clonidine 0.1 mg every 4 hours as needed, give 1 dose if systolic BP is over 180 and/or diastolic BP is over 100. - Order potassium chloride tablet 40 mEq for 2 doses. - Order repeat CMP for tomorrow morning   During the hospitalization, other adjustments were made to the patient's psychiatric medication regimen:  -Continue Prozac 60 mg once a day, with plans to increase to 80 mg tomorrow (06/24/2022) - Continue CIWA protocol - Ativan for alcohol and benzo use was tapered to 1 mg qid (dose at time of ED transfer) with plans to further taper to stop  - Continue Gabapentin from 800 milligrams 4 times daily (with plan to taper prior to seizure)  - Continue Keppra 500 mg twice daily - Continue Norvasc 10 mg daily   #During the patient's hospitalization, patient had extensive initial psychiatric evaluation, and follow-up psychiatric evaluations every day.   #Patient's care was discussed during the interdisciplinary team meeting every day during the hospitalization. Patient was asked each day to complete a self inventory noting mood, mental status, pain, new symptoms, anxiety and concerns. Supportive psychotherapy was provided to the patient. The patient also participated in regular group therapy while hospitalized. Coping skills, problem solving as well as relaxation therapies were also part of the unit programming  #During the course of the patient's hospitalization, she denied having suicidal thoughts.  Patient denies having homicidal thoughts. Patient denies having auditory hallucinations.  Patient denies any visual hallucinations or other symptoms  of psychosis. The patient was motivated to continue taking medication and transfer to [facility] rehab with the goal of continued improvement in mental health.    Musculoskeletal: Strength & Muscle Tone:  within normal limits Gait & Station: normal Patient leans: N/A  Psychiatric Specialty Exam  Presentation  General Appearance: Casual  Eye Contact:Fair  Speech:Slow; Normal Rate  Speech Volume:Decreased  Handedness:Right   Mood and Affect  Mood:Depressed; Anxious  Duration of Depression Symptoms: Greater than two weeks  Affect:Restricted   Thought Process  Thought Processes:Linear  Descriptions of Associations:Intact  Orientation:Full (Time, Place and Person)  Thought Content:Logical  History of Schizophrenia/Schizoaffective disorder:No  Duration of Psychotic Symptoms:No data recorded Hallucinations:Hallucinations: None  Ideas of Reference:None  Suicidal Thoughts:Suicidal Thoughts: No  Homicidal Thoughts:Homicidal Thoughts: No   Sensorium  Memory:Immediate Good; Recent Good; Remote Good  Judgment:Fair  Insight:Fair   Executive Functions  Concentration:Fair  Attention Span:Fair  Recall:Fair  Fund of Knowledge:Fair  Language:Fair   Psychomotor Activity  Psychomotor Activity:Psychomotor Activity: Normal   Assets  Assets:Desire for Improvement; Manufacturing systems engineer; Vocational/Educational   Sleep  Sleep:Sleep: Poor Number of Hours of Sleep: 7.25   Physical Exam: Physical Exam: Generalized tonic clonic movement. ROS: Unable to assess.  Blood pressure (!) 123/97, pulse (!) 101, temperature (!) 97.5 F (36.4 C), temperature source Oral, resp. rate 16, height 5\' 5"  (1.651 m), weight 63.5 kg, SpO2 100 %. Body mass index is 23.3 kg/m.  Mental Status Per Nursing Assessment::   On Admission:     Demographic Factors:  Caucasian  Loss Factors: Loss of significant relationship  Historical Factors: Prior suicide attempts  Risk Reduction Factors:   Sense of responsibility to family and Living with another person, especially a relative  Continued Clinical Symptoms:  Depression:   Comorbid alcohol abuse/dependence Alcohol/Substance  Abuse/Dependencies Generalized Anxiety  Cognitive Features That Contribute To Risk:  None    Suicide Risk:  Mild: There are no identifiable suicide plans, no associated intent, mild dysphoria and related symptoms, good self-control (both objective and subjective assessment), few other risk factors, and identifiable protective factors, including available and accessible social support.   Follow-up Information     Services, [facility]. Go on 06/30/2022.   Why: You have a hospital follow up appointment for an assessment to obtain medication management services on 06/30/22 at 9:00 am.  This appointment will be held in person, is for assessment only, and may last approximately 3 hours. Contact information: [address] [place] [phone]                Plan of care instructions and recommendations:  Patient was discharged to the emergency department. Recommend medical admission for management and alcohol and benzo w/d in the context of pt having seizure at [facility]. Recommend continuing prozac during medical admission. Recommend dc to [facility] when medically clear for substance use treatment.     [facility], MD 06/23/2022, 2:09 PM

## 2022-06-23 NOTE — H&P (Signed)
History and Physical    Patient: Kelsey [name] QMV:[id number] DOB: 1980-12-24 DOA: 06/23/2022 DOS: the patient was seen and examined on 06/23/2022 PCP: Anabel Halon, MD  Patient coming from: Home  Chief Complaint:  Chief Complaint  Patient presents with   Seizures   HPI: Kelsey [name] is a 41 y.o. female with medical history significant of anxiety, asthma, depression, GERD, hypertension, palpitations, perianal abscess, PVC, seasonal allergies, seizure disorder, tachycardia who was sent from the behavioral health hospital where she was admitted for depression, detoxification from alcohol and high-dose gabapentin due to a seizure.  Patient stated she felt her usual aura sensation, but does not remember much of the details.  She has been sleeping well while hospitalized.He denied fever, chills, rhinorrhea, sore throat, wheezing or hemoptysis.  No chest pain, palpitations, diaphoresis, PND, orthopnea or pitting edema of the lower extremities.  No abdominal pain, nausea, emesis, diarrhea, constipation, melena or hematochezia.  No flank pain, dysuria, frequency or hematuria.  No polyuria, polydipsia, polyphagia or blurred vision.  She was loaded with Keppra and given lorazepam 2 mg IVP x2.  Review of Systems: As mentioned in the history of present illness. All other systems reviewed and are negative.  Past Medical History:  Diagnosis Date   Anxiety    Asthma    hosp. 2018 and 2019 no h/o intubation    Depression    GERD (gastroesophageal reflux disease)    Hypertension    Palpitations    Perianal abscess 11/22/2016   12/07/2016.  [facility], [place].  Ovidio Kin, MD.  Perianal abscess is much better but still needs local wound care.  Continue sitz baths 2-3 times perday until all drainage and pain are gone.  Follow up as needed.   Preop general physical exam 01/26/2022   PVC (premature ventricular contraction)    Seasonal allergies    Seizures (HCC)    01/15/22 was last  seizure   Tachycardia    Past Surgical History:  Procedure Laterality Date   CESAREAN SECTION  10/15/2009, 02/08/2006   INCISION AND DRAINAGE PERIRECTAL ABSCESS Bilateral 11/22/2016   Procedure: IRRIGATION AND DEBRIDEMENT PERIRECTAL ABSCESS;  Surgeon: Ovidio Kin, MD;  Location: [facility];  Service: General;  Laterality: Bilateral;   INTRAUTERINE DEVICE INSERTION     Mirena 12/24/19 exp 12/28/21 Dr Philip Aspen [facility]   TOTAL HIP ARTHROPLASTY Left 02/17/2022   Procedure: Left TOTAL HIP ARTHROPLASTY ANTERIOR APPROACH;  Surgeon: Kathryne Hitch, MD;  Location: [facility];  Service: Orthopedics;  Laterality: Left;   TOTAL HIP ARTHROPLASTY Right 05/05/2022   Procedure: RIGHT TOTAL HIP ARTHROPLASTY ANTERIOR APPROACH;  Surgeon: Kathryne Hitch, MD;  Location: [facility];  Service: Orthopedics;  Laterality: Right;   Social History:  reports that she has been smoking cigarettes. She has a 2.50 pack-year smoking history. She has never used smokeless tobacco. She reports current alcohol use. She reports current drug use. Drug: Marijuana.  Allergies  Allergen Reactions   Penicillins Rash    Has patient had a PCN reaction causing immediate rash, facial/tongue/throat swelling, SOB or lightheadedness with hypotension: No Has patient had a PCN reaction causing severe rash involving mucus membranes or skin necrosis: No Has patient had a PCN reaction that required hospitalization No Has patient had a PCN reaction occurring within the last 10 years: No If all of the above answers are "NO", then may proceed with Cephalosporin use.    Family History  Problem Relation Age of Onset   Rheumatologic  disease Mother    Arthritis Mother    Asthma Mother    Depression Mother    Heart disease Father    Asthma Maternal Grandmother    Hypertension Maternal Grandmother    Heart disease Maternal Grandfather    Lung cancer Paternal Grandmother    Healthy Daughter    Healthy Son     Prior to  Admission medications   Medication Sig Start Date End Date Taking? Authorizing Provider  albuterol (VENTOLIN HFA) 108 (90 Base) MCG/ACT inhaler Inhale 1-2 puffs into the lungs every 6 (six) hours as needed for wheezing or shortness of breath.   Yes [provider]  cyclobenzaprine (FLEXERIL) 10 MG tablet Take 1 tablet (10 mg total) by mouth 3 (three) times daily as needed for muscle spasms. 05/05/22  Yes Kathryne Hitch, MD  diphenhydrAMINE (BENADRYL) 25 MG tablet Take 25 mg by mouth every 6 (six) hours as needed for allergies.   Yes [provider]  FLUoxetine (PROZAC) 20 MG capsule Take 80 mg by mouth daily.   Yes [provider]  gabapentin (NEURONTIN) 400 MG capsule Take 2 capsules (800 mg total) by mouth 3 (three) times daily. 06/23/22  Yes Massengill, Harrold Donath, MD  hydrALAZINE (APRESOLINE) 25 MG tablet Take 25 mg by mouth 3 (three) times daily.   Yes [provider]  levETIRAcetam (KEPPRA) 500 MG tablet Take 1 tablet (500 mg total) by mouth 2 (two) times daily. 01/20/22  Yes Tat, Onalee Hua, MD  levonorgestrel (MIRENA) 20 MCG/24HR IUD 1 Intra Uterine Device (1 each total) by Intrauterine route once. 07/16/15  Yes Adonis Brook, NP  nicotine (NICODERM CQ - DOSED IN MG/24 HOURS) 14 mg/24hr patch Place 1 patch (14 mg total) onto the skin daily at 6 (six) AM. 06/24/22  Yes Massengill, Harrold Donath, MD  traZODone (DESYREL) 50 MG tablet Take 1 tablet (50 mg total) by mouth at bedtime as needed for sleep. Patient taking differently: Take 50 mg by mouth at bedtime. 06/23/22  Yes Massengill, Harrold Donath, MD  ALPRAZolam Prudy Feeler) 1 MG tablet Take 1 tablet (1 mg total) by mouth at bedtime as needed for anxiety. Patient not taking: Reported on 06/23/2022 10/02/20   Devoria Albe, MD  amLODipine (NORVASC) 10 MG tablet Take 1 tablet (10 mg total) by mouth daily. 06/24/22   Massengill, Harrold Donath, MD  gabapentin (NEURONTIN) 300 MG capsule TAKE 2 CAPSULES (600 MG TOTAL) BY MOUTH 3 (THREE) TIMES  DAILY. Patient not taking: Reported on 06/23/2022 05/03/18   McLean-Scocuzza, Pasty Spillers, MD  HYDROcodone-acetaminophen (NORCO) 7.5-325 MG tablet Take 1 tablet by mouth every 6 (six) hours as needed for moderate pain. Patient not taking: Reported on 06/23/2022 05/18/22   Kathryne Hitch, MD    Physical Exam: Vitals:   06/23/22 1556 06/23/22 1557 06/23/22 1600 06/23/22 1915  BP: 129/89  115/86 (!) 124/97  Pulse: 79  80 96  Resp: 15   (!) 26  Temp: (!) 97.5 F (36.4 C)     TempSrc: Oral     SpO2: 100%   99%  Weight:  63.5 kg    Height:  5\' 5"  (1.651 m)     Physical Exam Vitals and nursing note reviewed.  Constitutional:      Appearance: Normal appearance. She is normal weight.  HENT:     Head: Normocephalic.     Mouth/Throat:     Mouth: Mucous membranes are moist.  Eyes:     General: No scleral icterus.    Pupils: Pupils are equal, round,  and reactive to light.  Neck:     Vascular: No JVD.  Cardiovascular:     Rate and Rhythm: Normal rate and regular rhythm.     Heart sounds: S1 normal and S2 normal.  Pulmonary:     Effort: Pulmonary effort is normal.     Breath sounds: Normal breath sounds. No wheezing, rhonchi or rales.  Abdominal:     General: Bowel sounds are normal.     Palpations: Abdomen is soft.     Tenderness: There is no right CVA tenderness or left CVA tenderness.  Musculoskeletal:     Cervical back: Neck supple.     Right lower leg: No edema.     Left lower leg: No edema.  Skin:    General: Skin is warm and dry.  Neurological:     General: No focal deficit present.     Mental Status: She is alert and oriented to person, place, and time.     Sensory: No sensory deficit.     Motor: Tremor present.  Psychiatric:        Mood and Affect: Mood is anxious.     Data Reviewed:  There are no new results to review at this time.  Assessment and Plan: Principal Problem:   Alcohol withdrawal seizure (HCC) Observation/PCU. Continue current dose of  gabapentin 800 mg TID. Continue Keppra 500 mg p.o. twice daily. Continue lorazepam per CIWA protocol. Consider neurology evaluation if there is recurrence.  Active Problems:   Alcohol use disorder, severe, dependence (HCC) CIWA protocol with lorazepam. Magnesium sulfate supplementation. Folate, MVI and thiamine. Consult TOC.    Depression, major, recurrent, moderate (HCC) Continue fluoxetine 60 mg p.o. daily. Consult psychiatry.    Asthma Supplemental oxygen as needed. Albuterol MDI 2 puffs every 6 hours as needed.    Essential hypertension Currently not on antihypertensives. Monitor blood pressure. As needed therapy. Follow-up with PCP.    Tobacco abuse Declined nicotine replacement therapy.     Advance Care Planning:   Code Status: Full Code   Consults: Psychiatry consult requested.  Family Communication:   Severity of Illness: The appropriate patient status for this patient is INPATIENT. Inpatient status is judged to be reasonable and necessary in order to provide the required intensity of service to ensure the patient's safety. The patient's presenting symptoms, physical exam findings, and initial radiographic and laboratory data in the context of their chronic comorbidities is felt to place them at high risk for further clinical deterioration. Furthermore, it is not anticipated that the patient will be medically stable for discharge from the hospital within 2 midnights of admission.   * I certify that at the point of admission it is my clinical judgment that the patient will require inpatient hospital care spanning beyond 2 midnights from the point of admission due to high intensity of service, high risk for further deterioration and high frequency of surveillance required.*  Author: Bobette Mo, MD 06/23/2022 8:11 PM  For on call review [email or web address].   This document was prepared using Dragon voice recognition software and may contain some unintended  transcription errors.

## 2022-06-23 NOTE — ED Provider Notes (Signed)
[place] DEPT Provider Note   CSN: [id number] Arrival date & time: 06/23/22  1417     History  Chief Complaint  Patient presents with   Alcohol Problem    ETOH Use disorder   Seizures    Kelsey [name] is a 41 y.o. female.  Patient brought from behavioral health hospital with seizures and concern for alcohol withdrawal.  She was being seen for alcohol and Xanax withdrawal and suicidal ideation since July 25.  She reports her last drink was 6 days ago last dose of Xanax was 5 days ago.  At behavioral health she had a seizure lasting approximately 2 minutes and was given IV Ativan.  On arrival to [place] [facility] she had another seizure lasting approximately 1 minute and was given 2 additional milligrams of Ativan.  She does have a history of seizure disorder and apparently takes Keppra. No known fevers.  No chest pain, shortness of breath, nausea or vomiting.  No other illicit drug use.  On arrival patient had tremors of her face and hands and stiffening of her arms and legs.  Did blink to threat however and resist raising of her arms.  The history is provided by the patient and the EMS personnel.  Alcohol Problem  Seizures      Home Medications Prior to Admission medications   Medication Sig Start Date End Date Taking? Authorizing Provider  albuterol (VENTOLIN HFA) 108 (90 Base) MCG/ACT inhaler Inhale 1-2 puffs into the lungs every 6 (six) hours as needed for wheezing or shortness of breath.   Yes [provider]  cyclobenzaprine (FLEXERIL) 10 MG tablet Take 1 tablet (10 mg total) by mouth 3 (three) times daily as needed for muscle spasms. 05/05/22  Yes Mcarthur Rossetti, MD  diphenhydrAMINE (BENADRYL) 25 MG tablet Take 25 mg by mouth every 6 (six) hours as needed for allergies.   Yes [provider]  FLUoxetine (PROZAC) 20 MG capsule Take 60 mg by mouth daily. Patient stated on 60mg  daily, prescriber considering increase to  80mg    Yes [provider]  ALPRAZolam (XANAX) 1 MG tablet Take 1 tablet (1 mg total) by mouth at bedtime as needed for anxiety. Patient taking differently: Take 1 mg by mouth 3 (three) times daily. 10/02/20   Rolland Porter, MD  amLODipine (NORVASC) 10 MG tablet Take 1 tablet (10 mg total) by mouth daily. 06/24/22   Massengill, Ovid Curd, MD  gabapentin (NEURONTIN) 300 MG capsule TAKE 2 CAPSULES (600 MG TOTAL) BY MOUTH 3 (THREE) TIMES DAILY. Patient taking differently: Take 1,200 mg by mouth 4 (four) times daily. 05/03/18   McLean-Scocuzza, Nino Glow, MD  gabapentin (NEURONTIN) 400 MG capsule Take 2 capsules (800 mg total) by mouth 3 (three) times daily. 06/23/22   Massengill, Ovid Curd, MD  HYDROcodone-acetaminophen (NORCO) 7.5-325 MG tablet Take 1 tablet by mouth every 6 (six) hours as needed for moderate pain. 05/18/22   Mcarthur Rossetti, MD  levETIRAcetam (KEPPRA) 500 MG tablet Take 1 tablet (500 mg total) by mouth 2 (two) times daily. 01/20/22   Orson Eva, MD  levonorgestrel (MIRENA) 20 MCG/24HR IUD 1 Intra Uterine Device (1 each total) by Intrauterine route once. 07/16/15   Kerrie Buffalo, NP  nicotine (NICODERM CQ - DOSED IN MG/24 HOURS) 14 mg/24hr patch Place 1 patch (14 mg total) onto the skin daily at 6 (six) AM. 06/24/22   Massengill, Ovid Curd, MD  traZODone (DESYREL) 50 MG tablet Take 1 tablet (50 mg total) by mouth at  bedtime as needed for sleep. 06/23/22   Massengill, Ovid Curd, MD      Allergies    Penicillins    Review of Systems   Review of Systems  Neurological:  Positive for seizures.    Physical Exam Updated Vital Signs BP (!) 123/97 (BP Location: Right Arm)   Pulse (!) 101   Temp (!) 97.5 F (36.4 C) (Oral)   Resp 16   Ht 5\' 5"  (1.651 m)   Wt 63.5 kg   SpO2 97%   BMI 23.30 kg/m  Physical Exam Vitals and nursing note reviewed.  Constitutional:      General: She is not in acute distress.    Appearance: She is well-developed.  HENT:     Head: Normocephalic and  atraumatic.     Mouth/Throat:     Pharynx: No oropharyngeal exudate.  Eyes:     Conjunctiva/sclera: Conjunctivae normal.     Pupils: Pupils are equal, round, and reactive to light.  Neck:     Comments: No meningismus. Cardiovascular:     Rate and Rhythm: Normal rate and regular rhythm.     Heart sounds: Normal heart sounds. No murmur heard. Pulmonary:     Effort: Pulmonary effort is normal. No respiratory distress.     Breath sounds: Normal breath sounds.  Abdominal:     Palpations: Abdomen is soft.     Tenderness: There is no abdominal tenderness. There is no guarding or rebound.  Musculoskeletal:        General: No tenderness. Normal range of motion.     Cervical back: Normal range of motion and neck supple.  Skin:    General: Skin is warm.  Neurological:     Mental Status: She is alert and oriented to person, place, and time.     Cranial Nerves: No cranial nerve deficit.     Motor: No abnormal muscle tone.     Coordination: Coordination normal.     Comments: Patient was initially obtunded and not following commands.  She had stiffening of her arms and legs with tremors.  She did however blink to threat and resist holding her arms up against gravity.  After seizure activity has stopped she has equal strength throughout and able to hold arms and legs off the bed bilaterally  Psychiatric:        Behavior: Behavior normal.     ED Results / Procedures / Treatments   Labs (all labs ordered are listed, but only abnormal results are displayed) Labs Reviewed  CBC - Abnormal; Notable for the following components:      Result Value   MCV 101.5 (*)    RDW 17.8 (*)    All other components within normal limits  COMPREHENSIVE METABOLIC PANEL - Abnormal; Notable for the following components:   Potassium 2.8 (*)    Calcium 8.6 (*)    Albumin 3.2 (*)    AST 48 (*)    ALT 53 (*)    All other components within normal limits  LIPID PANEL - Abnormal; Notable for the following  components:   HDL 33 (*)    All other components within normal limits  RAPID URINE DRUG SCREEN, HOSP PERFORMED - Abnormal; Notable for the following components:   Benzodiazepines POSITIVE (*)    Tetrahydrocannabinol POSITIVE (*)    All other components within normal limits  COMPREHENSIVE METABOLIC PANEL - Abnormal; Notable for the following components:   Potassium 3.3 (*)    Calcium 8.8 (*)    Albumin  3.4 (*)    ALT 45 (*)    All other components within normal limits  BASIC METABOLIC PANEL - Abnormal; Notable for the following components:   Calcium 8.4 (*)    All other components within normal limits  CBC WITH DIFFERENTIAL/PLATELET - Abnormal; Notable for the following components:   RBC 3.59 (*)    MCV 103.1 (*)    RDW 17.7 (*)    All other components within normal limits  COMPREHENSIVE METABOLIC PANEL - Abnormal; Notable for the following components:   BUN <5 (*)    Calcium 8.8 (*)    Albumin 3.3 (*)    All other components within normal limits  RAPID URINE DRUG SCREEN, HOSP PERFORMED - Abnormal; Notable for the following components:   Benzodiazepines POSITIVE (*)    Tetrahydrocannabinol POSITIVE (*)    All other components within normal limits  URINALYSIS, ROUTINE W REFLEX MICROSCOPIC - Abnormal; Notable for the following components:   APPearance HAZY (*)    Leukocytes,Ua MODERATE (*)    Bacteria, UA RARE (*)    All other components within normal limits  SARS CORONAVIRUS 2 BY RT PCR  ETHANOL  TSH  PREGNANCY, URINE  ETHANOL  CBG MONITORING, ED    EKG None  Radiology No results found.  Procedures .Critical Care  Performed by: Glynn Octave, MD Authorized by: Glynn Octave, MD   Critical care provider statement:    Critical care time (minutes):  35   Critical care time was exclusive of:  Separately billable procedures and treating other patients   Critical care was necessary to treat or prevent imminent or life-threatening deterioration of the following  conditions:  CNS failure or compromise   Critical care was time spent personally by me on the following activities:  Development of treatment plan with patient or surrogate, discussions with consultants, evaluation of patient's response to treatment, examination of patient, ordering and review of laboratory studies, ordering and review of radiographic studies, ordering and performing treatments and interventions, pulse oximetry, re-evaluation of patient's condition and review of old charts   I assumed direction of critical care for this patient from another provider in my specialty: no     Care discussed with: admitting provider       Medications Ordered in ED Medications  acetaminophen (TYLENOL) tablet 650 mg (has no administration in time range)  alum & mag hydroxide-simeth (MAALOX/MYLANTA) 200-200-20 MG/5ML suspension 30 mL (has no administration in time range)  magnesium hydroxide (MILK OF MAGNESIA) suspension 30 mL (has no administration in time range)  thiamine (B-1) injection 100 mg (100 mg Intramuscular Not Given 06/20/22 0142)  thiamine tablet 100 mg (100 mg Oral Given 06/23/22 0900)  multivitamin with minerals tablet 1 tablet (1 tablet Oral Given 06/23/22 0900)  LORazepam (ATIVAN) tablet 1 mg (1 mg Oral Given 06/20/22 0856)  hydrOXYzine (ATARAX) tablet 25 mg (25 mg Oral Given 06/22/22 2111)  loperamide (IMODIUM) capsule 2-4 mg (has no administration in time range)  ondansetron (ZOFRAN-ODT) disintegrating tablet 4 mg (has no administration in time range)  nicotine (NICODERM CQ - dosed in mg/24 hours) patch 14 mg (14 mg Transdermal Not Given 06/23/22 0900)  levETIRAcetam (KEPPRA) tablet 500 mg (500 mg Oral Given 06/23/22 0900)  cyclobenzaprine (FLEXERIL) tablet 10 mg (10 mg Oral Given 06/22/22 2111)  albuterol (VENTOLIN HFA) 108 (90 Base) MCG/ACT inhaler 2 puff (2 puffs Inhalation Given 06/23/22 1146)  LORazepam (ATIVAN) injection 2 mg (2 mg Intramuscular Given 06/23/22 1344)  traZODone  (DESYREL) tablet 50  mg (50 mg Oral Given 06/22/22 2111)  cloNIDine (CATAPRES) tablet 0.1 mg (0.1 mg Oral Given 06/23/22 0917)  feeding supplement (ENSURE ENLIVE / ENSURE PLUS) liquid 237 mL (237 mLs Oral Patient Refused/Not Given 06/22/22 2206)  feeding supplement (BOOST / RESOURCE BREEZE) liquid 1 Container (1 Container Oral Given 06/23/22 1007)  amLODipine (NORVASC) tablet 10 mg (10 mg Oral Given 06/23/22 0900)  LORazepam (ATIVAN) tablet 1 mg (has no administration in time range)  hydrOXYzine (ATARAX) tablet 25 mg (25 mg Oral Given 06/23/22 1149)  loperamide (IMODIUM) capsule 2-4 mg (has no administration in time range)  ondansetron (ZOFRAN-ODT) disintegrating tablet 4 mg (has no administration in time range)  gabapentin (NEURONTIN) capsule 800 mg (800 mg Oral Given 06/23/22 1147)  gabapentin (NEURONTIN) capsule 600 mg (has no administration in time range)    Followed by  gabapentin (NEURONTIN) capsule 400 mg (has no administration in time range)    Followed by  gabapentin (NEURONTIN) capsule 300 mg (has no administration in time range)    Followed by  gabapentin (NEURONTIN) capsule 200 mg (has no administration in time range)    Followed by  gabapentin (NEURONTIN) capsule 100 mg (has no administration in time range)  FLUoxetine (PROZAC) capsule 80 mg (has no administration in time range)  LORazepam (ATIVAN) tablet 1 mg (has no administration in time range)  LORazepam (ATIVAN) tablet 1 mg (has no administration in time range)    Followed by  LORazepam (ATIVAN) tablet 0.5 mg (has no administration in time range)    Followed by  LORazepam (ATIVAN) tablet 0.5 mg (has no administration in time range)  LORazepam (ATIVAN) tablet 1-4 mg (has no administration in time range)    Or  LORazepam (ATIVAN) injection 1-4 mg (has no administration in time range)  thiamine (VITAMIN B1) tablet 100 mg (has no administration in time range)    Or  thiamine (VITAMIN B1) injection 100 mg (has no administration in  time range)  folic acid (FOLVITE) tablet 1 mg (has no administration in time range)  multivitamin with minerals tablet 1 tablet (has no administration in time range)  LORazepam (ATIVAN) injection 0-4 mg (has no administration in time range)    Followed by  LORazepam (ATIVAN) injection 0-4 mg (has no administration in time range)  levETIRAcetam (KEPPRA) IVPB 1000 mg/100 mL premix (has no administration in time range)  gabapentin (NEURONTIN) capsule 800 mg (800 mg Oral Given 06/22/22 2111)  diazepam (VALIUM) tablet 5 mg (5 mg Oral Given 06/20/22 1401)  white petrolatum (VASELINE) gel (  Given 06/20/22 1406)  potassium chloride SA (KLOR-CON M) CR tablet 40 mEq (40 mEq Oral Given 06/20/22 1815)  potassium chloride SA (KLOR-CON M) CR tablet 40 mEq (40 mEq Oral Given 06/21/22 [id number])    ED Course/ Medical Decision Making/ A&P                           Medical Decision Making Amount and/or Complexity of Data Reviewed Labs: ordered. ECG/medicine tests: ordered.  Risk OTC drugs. Prescription drug management.   From [facility] with seizure acitivity. Hx alcohol and xanax withdrawal as well as seizure disorder on keppra.   Seizing on arrival, ativan IV given. Keppra loading dose.  Labs with hypokalemia.   Concern for DTs. Continue CIWA protocol. Ativan, thiamine. Folate.   Seizure activity resolved. Protecting airway.  Given recurrent seizure and concern for DTs, admission d/w Dr. Olevia Bowens.         Final  Clinical Impression(s) / ED Diagnoses Final diagnoses:  Alcohol use disorder, severe, dependence (HCC)    Rx / DC Orders ED Discharge Orders          Ordered    amLODipine (NORVASC) 10 MG tablet  Daily        06/23/22 1356    nicotine (NICODERM CQ - DOSED IN MG/24 HOURS) 14 mg/24hr patch  Daily        06/23/22 1356    traZODone (DESYREL) 50 MG tablet  At bedtime PRN        06/23/22 1356    gabapentin (NEURONTIN) 400 MG capsule  3 times daily        06/23/22 1356    Increase activity  slowly        06/23/22 1356    Diet - low sodium heart healthy        06/23/22 1356              Lucrecia Mcphearson, Jeannett Senior, MD 06/23/22 2113

## 2022-06-23 NOTE — Social Work (Signed)
CSW added in-patient, out-patient, counseling services.

## 2022-06-23 NOTE — ED Triage Notes (Signed)
Pt BIBA from [facility]. Pt was being treated for ETOH, and xanax withdrawals. Pt experienced seizure lasting approx 2 min, and was given 2 mg ativan IM by [facility] staff. Pt experienced another seizure lasting 1.5 min and was given 2 mg ativan IV.  Pt alert and oriented on arrival.  20 g in left hand

## 2022-06-23 NOTE — ED Notes (Signed)
States she last drank app 22:45 tonight.

## 2022-06-23 NOTE — ED Notes (Signed)
Seizure lasting approx 2 min. Pt placed in side position, with O2, and suction ready.

## 2022-06-23 NOTE — ED Provider Notes (Signed)
[facility]-EMERGENCY DEPT Provider Note   CSN: [id number] Arrival date & time: 06/23/22  2215     History  Chief Complaint  Patient presents with   Medical Clearance    Kelsey [name] is a 41 y.o. female.  HPI  41 year old female presents emergency department after being seen earlier today for alcohol withdrawal seizure.  She left AGAINST MEDICAL ADVICE went back to behavioral urgent care where she was then sent back to the emergency department for medical clearance.  She states she left because no one updated her on her course of treatment or disposition while in the emergency department.   She has been seen at the behavioral health hospital since Tuesday of this week for concerns of alcohol and Xanax withdrawal as well as suicidal ideation.  Today, she had 2 seizures while at the behavioral health urgent care was given Ativan.  Upon arrival to [facility], patient had another seizure lasting approximately 1 minute.  She has a history of seizure disorder and states she also has a history of alcohol withdrawal seizures.  Admission was set up here at [facility] long with patient left before admission followed through.  Patient is currently complaining of no symptoms.  She states she had no seizure-like activity since leaving his medical advice.  She denies chest pain, shortness of breath, nausea/vomiting, abdominal pain.  Currently denies suicidal/homicidal ideation, auditory visual hallucinations.  Denies alcoholic drink 6 days ago.  Home Medications Prior to Admission medications   Medication Sig Start Date End Date Taking? Authorizing Provider  albuterol (VENTOLIN HFA) 108 (90 Base) MCG/ACT inhaler Inhale 1-2 puffs into the lungs every 6 (six) hours as needed for wheezing or shortness of breath.    [provider]  ALPRAZolam Prudy Feeler) 1 MG tablet Take 1 tablet (1 mg total) by mouth at bedtime as needed for anxiety. Patient not taking: Reported on 06/23/2022  10/02/20   Devoria Albe, MD  amLODipine (NORVASC) 10 MG tablet Take 1 tablet (10 mg total) by mouth daily. 06/24/22   Massengill, Harrold Donath, MD  cyclobenzaprine (FLEXERIL) 10 MG tablet Take 1 tablet (10 mg total) by mouth 3 (three) times daily as needed for muscle spasms. 05/05/22   Kathryne Hitch, MD  diphenhydrAMINE (BENADRYL) 25 MG tablet Take 25 mg by mouth every 6 (six) hours as needed for allergies.    [provider]  FLUoxetine (PROZAC) 20 MG capsule Take 80 mg by mouth daily.    [provider]  gabapentin (NEURONTIN) 300 MG capsule TAKE 2 CAPSULES (600 MG TOTAL) BY MOUTH 3 (THREE) TIMES DAILY. Patient not taking: Reported on 06/23/2022 05/03/18   McLean-Scocuzza, Pasty Spillers, MD  gabapentin (NEURONTIN) 400 MG capsule Take 2 capsules (800 mg total) by mouth 3 (three) times daily. 06/23/22   Massengill, Harrold Donath, MD  hydrALAZINE (APRESOLINE) 25 MG tablet Take 25 mg by mouth 3 (three) times daily.    [provider]  HYDROcodone-acetaminophen (NORCO) 7.5-325 MG tablet Take 1 tablet by mouth every 6 (six) hours as needed for moderate pain. Patient not taking: Reported on 06/23/2022 05/18/22   Kathryne Hitch, MD  levETIRAcetam (KEPPRA) 500 MG tablet Take 1 tablet (500 mg total) by mouth 2 (two) times daily. 01/20/22   Catarina Hartshorn, MD  levonorgestrel (MIRENA) 20 MCG/24HR IUD 1 Intra Uterine Device (1 each total) by Intrauterine route once. 07/16/15   Adonis Brook, NP  nicotine (NICODERM CQ - DOSED IN MG/24 HOURS) 14 mg/24hr patch Place 1 patch (14 mg  total) onto the skin daily at 6 (six) AM. 06/24/22   Massengill, Harrold Donath, MD  traZODone (DESYREL) 50 MG tablet Take 1 tablet (50 mg total) by mouth at bedtime as needed for sleep. Patient taking differently: Take 50 mg by mouth at bedtime. 06/23/22   Massengill, Harrold Donath, MD      Allergies    Penicillins    Review of Systems   Review of Systems  All other systems reviewed and are negative.   Physical Exam Updated Vital  Signs BP 99/69 (BP Location: Left Arm)   Pulse 95   Temp 98.5 F (36.9 C) (Oral)   Resp 18   Ht 5\' 5"  (1.651 m)   Wt 63 kg   SpO2 93%   BMI 23.13 kg/m  Physical Exam Vitals and nursing note reviewed.  Constitutional:      General: She is not in acute distress.    Appearance: She is well-developed.  HENT:     Head: Normocephalic and atraumatic.  Eyes:     Conjunctiva/sclera: Conjunctivae normal.  Cardiovascular:     Rate and Rhythm: Normal rate and regular rhythm.     Heart sounds: No murmur heard. Pulmonary:     Effort: Pulmonary effort is normal. No respiratory distress.     Breath sounds: Normal breath sounds.  Abdominal:     Palpations: Abdomen is soft.     Tenderness: There is no abdominal tenderness.  Musculoskeletal:        General: No swelling.     Cervical back: Neck supple.  Skin:    General: Skin is warm and dry.     Capillary Refill: Capillary refill takes less than 2 seconds.  Neurological:     Mental Status: She is alert.     Comments: Cranial 3 through 12 grossly intact.  PERRLA bilaterally.  EOMs intact bilaterally.  Patient moves all 4 extremities without difficulty.  She is complaining no sensory deficits along major nerve distributions of upper and lower extremities.  No obvious laceration to the tongue or cheeks noted.  Obvious tremor of hands and face during exam.  Psychiatric:        Mood and Affect: Mood normal.     ED Results / Procedures / Treatments   Labs (all labs ordered are listed, but only abnormal results are displayed) Labs Reviewed - No data to display  EKG None  Radiology No results found.  Procedures Procedures    Medications Ordered in ED Medications  LORazepam (ATIVAN) tablet 1-4 mg (has no administration in time range)    Or  LORazepam (ATIVAN) injection 1-4 mg (has no administration in time range)    ED Course/ Medical Decision Making/ A&P                           Medical Decision Making Risk Prescription  drug management.   This patient presents to the ED for concern of alcohol withdrawal seizure, this involves an extensive number of treatment options, and is a complaint that carries with it a high risk of complications and morbidity.  The differential diagnosis includes The differential for seizures includes vascular issue such as AV malformations, stroke, encephalitis meningitis, Lyme disease, brain abscess, HIV, trauma, autoimmune conditions including SLE, vasculitis, hyponatremia other metabolic dyscrasias such as uremia or electrolyte abnormality, porphyria or hepatic encephalopathy, primary epilepsy, brain tumor, and specific syndromes (including Tuberous Sclerosis, Down's syndrome, Sturge Weber syndrome Von Hippel Lindau syndrome)  Also worth considering our  intoxication, recreational drug use, alcohol intoxication or withdrawal, medication noncompliance.  Wellbutrin, diltiazem, verapamil, lidocaine, cephalosporins, tricyclic antidepressants, certain antineoplastics, lithium, fentanyl, tramadol, cocaine, Benadryl, Sudafed  Sleep deprivation, caffeine withdrawal, anxiety, stress, dehydration and other physiologic stressors can also lower seizure threshold.    Co morbidities that complicate the patient evaluation  Hypertension, seizures, anxiety, tobacco abuse, alcohol abuse, generalized anxiety disorder, hyperlipidemia, major depressive disorder, cannabis abuse, alcohol withdrawal seizure   Additional history obtained:  Additional history obtained from medicines ordered from emergency department earlier today External records from outside source obtained and reviewed including patient was loaded with Keppra as antiseizure medicine.   Lab Tests:  No additional laboratory studies ordered from what was ordered prior  Imaging Studies ordered:  N/a   Cardiac Monitoring: / EKG:  The patient was maintained on a cardiac monitor.  I personally viewed and interpreted the cardiac monitored  which showed an underlying rhythm of: Sinus rhythm   Consultations Obtained:  I requested consultation with the hospital medicine Dr. Velia Meyer,  and discussed lab and imaging findings as well as pertinent plan - they recommend: Admission   Problem List / ED Course / Critical interventions / Medication management  Alcohol withdrawal seizure I ordered medication including Ativan as needed for alcohol withdrawal symptoms  Reevaluation of the patient after these medicines showed that the patient improved I have reviewed the patients home medicines and have made adjustments as needed   Social Determinants of Health:  Polysubstance abuse including alcohol, marijuana, benzodiazepine, tobacco   Test / Admission - Considered:  Alcohol abuse/withdrawal seizure Vitals signs within normal range and stable throughout visit. Laboratory and imaging studies considered but deemed necessary due to full work-up during prior emergency department visit.   Admission was discussed with patient regarding her recurrent seizures as well as concern for delirium tremens, she acknowledged understanding was agreeable to said plan.  Hospital medicine Dr. Velia Meyer was consulted regarding the patient he agreed with admission and assume further treatment/care of patient.  Patient was stable upon admission.        Final Clinical Impression(s) / ED Diagnoses Final diagnoses:  Alcohol withdrawal seizure without complication [facility])    Rx / DC Orders ED Discharge Orders     None         Wilnette Kales, [place] 06/24/22 [place], [place], DO 07/01/22 [phone]

## 2022-06-23 NOTE — ED Notes (Signed)
Patient has changed in appropriate scrubs and patients belonging have been collected. Patient has one white bag that contains clothes and shoes, place in nursing station 9-24.

## 2022-06-23 NOTE — Progress Notes (Signed)
Did not attend group 

## 2022-06-23 NOTE — Progress Notes (Addendum)
Patient was sent to [facility] ED via ambulance because of severe tremors.  Patient sitting in chair in hallway.  Ativan 2 mg IM given patient per MD orders.  Patient laid back in her chair, not speaking.  EMS arrived and information given to EMS.  Patient left for [facility] ED approximately 1415.  Severe tremors.  Patient talked to EMS workers while in the ambulance.  Ativan 1 mg po taken from pysis but not given to patient. Ativan 2 mg IM administered.

## 2022-06-24 ENCOUNTER — Encounter (HOSPITAL_COMMUNITY): Payer: Self-pay | Admitting: Internal Medicine

## 2022-06-24 DIAGNOSIS — F418 Other specified anxiety disorders: Secondary | ICD-10-CM | POA: Diagnosis present

## 2022-06-24 DIAGNOSIS — F17200 Nicotine dependence, unspecified, uncomplicated: Secondary | ICD-10-CM | POA: Diagnosis not present

## 2022-06-24 DIAGNOSIS — R569 Unspecified convulsions: Secondary | ICD-10-CM

## 2022-06-24 DIAGNOSIS — F1210 Cannabis abuse, uncomplicated: Secondary | ICD-10-CM

## 2022-06-24 DIAGNOSIS — E440 Moderate protein-calorie malnutrition: Secondary | ICD-10-CM | POA: Diagnosis present

## 2022-06-24 DIAGNOSIS — F411 Generalized anxiety disorder: Secondary | ICD-10-CM

## 2022-06-24 DIAGNOSIS — F10239 Alcohol dependence with withdrawal, unspecified: Secondary | ICD-10-CM | POA: Diagnosis not present

## 2022-06-24 LAB — COMPREHENSIVE METABOLIC PANEL
ALT: 24 U/L (ref 0–44)
AST: 23 U/L (ref 15–41)
Albumin: 2.9 g/dL — ABNORMAL LOW (ref 3.5–5.0)
Alkaline Phosphatase: 72 U/L (ref 38–126)
Anion gap: 5 (ref 5–15)
BUN: <5 mg/dL — ABNORMAL LOW (ref 6–20)
CO2: 26 mmol/L (ref 22–32)
Calcium: 8.4 mg/dL — ABNORMAL LOW (ref 8.9–10.3)
Chloride: 109 mmol/L (ref 98–111)
Creatinine, Ser: 0.59 mg/dL (ref 0.44–1.00)
GFR, Estimated: >60 mL/min (ref >60)
Glucose, Bld: 86 mg/dL (ref 70–99)
Potassium: 3.3 mmol/L — ABNORMAL LOW (ref 3.5–5.1)
Sodium: 140 mmol/L (ref 135–145)
Total Bilirubin: 0.5 mg/dL (ref 0.3–1.2)
Total Protein: 5.8 g/dL — ABNORMAL LOW (ref 6.5–8.1)

## 2022-06-24 LAB — CBC WITH DIFFERENTIAL/PLATELET
Abs Immature Granulocytes: 0.02 10*3/uL (ref 0.00–0.07)
Basophils Absolute: 0 10*3/uL (ref 0.0–0.1)
Basophils Relative: 1 %
Eosinophils Absolute: 0.1 10*3/uL (ref 0.0–0.5)
Eosinophils Relative: 2 %
HCT: 34.7 % — ABNORMAL LOW (ref 36.0–46.0)
Hemoglobin: 11.2 g/dL — ABNORMAL LOW (ref 12.0–15.0)
Immature Granulocytes: 0 %
Lymphocytes Relative: 29 %
Lymphs Abs: 1.5 10*3/uL (ref 0.7–4.0)
MCH: 33.1 pg (ref 26.0–34.0)
MCHC: 32.3 g/dL (ref 30.0–36.0)
MCV: 102.7 fL — ABNORMAL HIGH (ref 80.0–100.0)
Monocytes Absolute: 0.5 10*3/uL (ref 0.1–1.0)
Monocytes Relative: 10 %
Neutro Abs: 2.9 10*3/uL (ref 1.7–7.7)
Neutrophils Relative %: 58 %
Platelets: 218 10*3/uL (ref 150–400)
RBC: 3.38 MIL/uL — ABNORMAL LOW (ref 3.87–5.11)
RDW: 17.7 % — ABNORMAL HIGH (ref 11.5–15.5)
WBC: 5 10*3/uL (ref 4.0–10.5)
nRBC: 0 % (ref 0.0–0.2)

## 2022-06-24 LAB — MAGNESIUM: Magnesium: 2.1 mg/dL (ref 1.7–2.4)

## 2022-06-24 MED ORDER — FOLIC ACID 1 MG PO TABS
1.0000 mg | ORAL_TABLET | Freq: Every day | ORAL | Status: DC
  Administered 2022-06-24 – 2022-06-26 (×3): 1 mg via ORAL
  Filled 2022-06-24 (×3): qty 1

## 2022-06-24 MED ORDER — THIAMINE HCL 100 MG PO TABS
100.0000 mg | ORAL_TABLET | Freq: Every day | ORAL | Status: DC
  Administered 2022-06-24 – 2022-06-26 (×3): 100 mg via ORAL
  Filled 2022-06-24 (×3): qty 1

## 2022-06-24 MED ORDER — IPRATROPIUM-ALBUTEROL 0.5-2.5 (3) MG/3ML IN SOLN
3.0000 mL | RESPIRATORY_TRACT | Status: DC | PRN
  Administered 2022-06-24 (×3): 3 mL via RESPIRATORY_TRACT
  Filled 2022-06-24 (×2): qty 3

## 2022-06-24 MED ORDER — ALBUTEROL SULFATE (2.5 MG/3ML) 0.083% IN NEBU: 3.0000 mL | INHALATION_SOLUTION | RESPIRATORY_TRACT | Status: DC | PRN

## 2022-06-24 MED ORDER — FLUOXETINE HCL 20 MG PO CAPS
80.0000 mg | ORAL_CAPSULE | Freq: Every day | ORAL | Status: DC
  Administered 2022-06-24 – 2022-06-26 (×3): 80 mg via ORAL
  Filled 2022-06-24 (×3): qty 4

## 2022-06-24 MED ORDER — TRAZODONE HCL 50 MG PO TABS
50.0000 mg | ORAL_TABLET | Freq: Every day | ORAL | Status: DC
  Administered 2022-06-24 – 2022-06-25 (×2): 50 mg via ORAL
  Filled 2022-06-24 (×2): qty 1

## 2022-06-24 MED ORDER — GABAPENTIN 400 MG PO CAPS
800.0000 mg | ORAL_CAPSULE | Freq: Three times a day (TID) | ORAL | Status: DC
  Administered 2022-06-24 – 2022-06-26 (×7): 800 mg via ORAL
  Filled 2022-06-24 (×7): qty 2

## 2022-06-24 MED ORDER — ADULT MULTIVITAMIN W/MINERALS CH
1.0000 | ORAL_TABLET | Freq: Every day | ORAL | Status: DC
  Administered 2022-06-24 – 2022-06-26 (×3): 1 via ORAL
  Filled 2022-06-24 (×3): qty 1

## 2022-06-24 MED ORDER — POTASSIUM CHLORIDE CRYS ER 20 MEQ PO TBCR
40.0000 meq | EXTENDED_RELEASE_TABLET | ORAL | Status: AC
  Administered 2022-06-24 (×2): 40 meq via ORAL
  Filled 2022-06-24 (×2): qty 2

## 2022-06-24 MED ORDER — ACETAMINOPHEN 325 MG PO TABS
650.0000 mg | ORAL_TABLET | Freq: Four times a day (QID) | ORAL | Status: DC | PRN
  Administered 2022-06-24: 650 mg via ORAL
  Filled 2022-06-24: qty 2

## 2022-06-24 MED ORDER — LORAZEPAM 2 MG/ML IJ SOLN: 1.0000 mg | INTRAMUSCULAR | Status: DC | PRN

## 2022-06-24 MED ORDER — ACETAMINOPHEN 650 MG RE SUPP: 650.0000 mg | Freq: Four times a day (QID) | RECTAL | Status: DC | PRN

## 2022-06-24 MED ORDER — LEVETIRACETAM 500 MG PO TABS
500.0000 mg | ORAL_TABLET | Freq: Two times a day (BID) | ORAL | Status: DC
  Administered 2022-06-24 – 2022-06-26 (×5): 500 mg via ORAL
  Filled 2022-06-24 (×5): qty 1

## 2022-06-24 NOTE — Progress Notes (Signed)
  Carryover admission to the Day Admitter.  I discussed this case with the EDP, Sherian Maroon, PA.  Per these discussions:  This is a 41 year old female with history of chronic alcohol and benzodiazepine abuse, who is being admitted with seizures.  She has been in detox since 06/20/2022, and had originally presented to [facility] emergency department on 06/23/22 with 2 seizures.  She was admitted at that time before leaving AMA.   She presents back to [facility] emergency department later on 06/23/2022 at which time she had another reported seizure episode, which was witnessed by EDP.  Episode reportedly lasted for approximately 1 minute and spontaneously terminated, with ensuing postictal state that has subsequent improved, with patient now noted to be back to baseline mental status.  The patient now verbalizes amenability for admission.   I have placed an order for observation for further evaluation management seizures, suspected to be withdrawal in nature.  CIWA protocol is already in place via EDP.  I have placed some additional preliminary admit orders via the adult multi-morbid admission order set. I have also ordered prn IV Ativan specifically for seizures.  Also ordered seizure precautions and a.m. labs.    Newton Pigg, DO Hospitalist

## 2022-06-24 NOTE — Progress Notes (Signed)
Pt arrived to unit. Vitals stable. Pt belonging left at nursing station on 4E. Notified provider.

## 2022-06-24 NOTE — H&P (Signed)
History and Physical    Patient: Kelsey [name]:[id number] DOB: 06-Sep-1981 DOA: 06/23/2022 DOS: the patient was seen and examined on 06/24/2022 PCP: Kelsey Halon, [name]  Patient coming from: Home  Chief Complaint:  Chief Complaint  Patient presents with   Medical Clearance   HPI: Kelsey [name] is a 41 y.o. female with medical history significant for anxiety, asthma, depression, GERD, hypertension, palpitations, perianal abscess, PVC, seasonal allergies, seizure disorder, tachycardia who was sent from the behavioral health hospital where she was admitted for depression, detoxification from alprazolam, alcohol and high-dose gabapentin due to a seizure yesterday afternoon.  She was admitted, but then left the ER to be seen at the behavioral health UC from where she was returned back to the emergency department for medical clearance.  She stated she felt anxious and with tremors, but much better after she received lorazepam IV.  She was wheezing, but this resolved after albuterol MDI. No fever, chills or night sweats. No sore throat, rhinorrhea or hemoptysis.  No chest pain, palpitations, diaphoresis, PND, orthopnea or pitting edema of the lower extremities.  No appetite changes, abdominal pain, diarrhea, constipation, melena or hematochezia.  No flank pain, dysuria, frequency or hematuria.  No polyuria, polydipsia, polyphagia or blurred vision.  Has some trouble sleeping last night.  Denied SI or HI.  ED course: 98.6 F, pulse 113, respiration 17, BP 129/90 mmHg O2 sat 93% on room air.  Lab work: Repeat CBC with white count 5.0, hemoglobin 11.2 g/dL with an MCV 694.8 fL and platelets 218.  CMP showed potassium of 3.3 mmol/L, the rest of the electrolytes and renal functions were unremarkable after calcium was corrected.  LFTs show a total protein of 5.8 and albumin of 2.9 g/dL, but otherwise normal.   Review of Systems: As mentioned in the history of present illness. All other systems  reviewed and are negative.  Past Medical History:  Diagnosis Date   Anxiety    Asthma    hosp. 2018 and 2019 no h/o intubation    Depression    GERD (gastroesophageal reflux disease)    Hypertension    Palpitations    Perianal abscess 11/22/2016   12/07/2016.  Kelsey [name].  Kelsey [name].  Perianal abscess is much better but still needs local wound care.  Continue sitz baths 2-3 times perday until all drainage and pain are gone.  Follow up as needed.   Preop general physical exam 01/26/2022   PVC (premature ventricular contraction)    Seasonal allergies    Seizures (HCC)    01/15/22 was last seizure   Tachycardia    Past Surgical History:  Procedure Laterality Date   CESAREAN SECTION  10/15/2009, 02/08/2006   INCISION AND DRAINAGE PERIRECTAL ABSCESS Bilateral 11/22/2016   Procedure: IRRIGATION AND DEBRIDEMENT PERIRECTAL ABSCESS;  Surgeon: Kelsey [name];  Location: Kelsey [name];  Service: General;  Laterality: Bilateral;   INTRAUTERINE DEVICE INSERTION     Mirena 12/24/19 exp 12/28/21 Kelsey [name]   TOTAL HIP ARTHROPLASTY Left 02/17/2022   Procedure: Left TOTAL HIP ARTHROPLASTY ANTERIOR APPROACH;  Surgeon: Kelsey [name];  Location: Kelsey [name];  Service: Orthopedics;  Laterality: Left;   TOTAL HIP ARTHROPLASTY Right 05/05/2022   Procedure: RIGHT TOTAL HIP ARTHROPLASTY ANTERIOR APPROACH;  Surgeon: Kelsey [name];  Location: Kelsey [name];  Service: Orthopedics;  Laterality: Right;   Social History:  reports that she has been smoking cigarettes. She has a 2.50 pack-year smoking history.  She has never used smokeless tobacco. She reports current alcohol use. She reports current drug use. Drug: Marijuana.  Allergies  Allergen Reactions   Penicillins Rash    Has patient had a PCN reaction causing immediate rash, facial/tongue/throat swelling, SOB or lightheadedness with hypotension: No Has patient had a PCN reaction causing severe rash  involving mucus membranes or skin necrosis: No Has patient had a PCN reaction that required hospitalization No Has patient had a PCN reaction occurring within the last 10 years: No If all of the above answers are "NO", then may proceed with Cephalosporin use.    Family History  Problem Relation Age of Onset   Rheumatologic disease Mother    Arthritis Mother    Asthma Mother    Depression Mother    Heart disease Father    Asthma Maternal Grandmother    Hypertension Maternal Grandmother    Heart disease Maternal Grandfather    Lung cancer Paternal Grandmother    Healthy Daughter    Healthy Son     Prior to Admission medications   Medication Sig Start Date End Date Taking? Authorizing Provider  albuterol (VENTOLIN HFA) 108 (90 Base) MCG/ACT inhaler Inhale 1-2 puffs into the lungs every 6 (six) hours as needed for wheezing or shortness of breath.    Provider, Historical, [name]  ALPRAZolam Prudy Feeler) 1 MG tablet Take 1 tablet (1 mg total) by mouth at bedtime as needed for anxiety. Patient not taking: Reported on 06/23/2022 10/02/20   Kelsey Albe, [name]  amLODipine (NORVASC) 10 MG tablet Take 1 tablet (10 mg total) by mouth daily. 06/24/22   Kelsey [name]  cyclobenzaprine (FLEXERIL) 10 MG tablet Take 1 tablet (10 mg total) by mouth 3 (three) times daily as needed for muscle spasms. 05/05/22   Kelsey [name]  diphenhydrAMINE (BENADRYL) 25 MG tablet Take 25 mg by mouth every 6 (six) hours as needed for allergies.    Provider, Historical, [name]  FLUoxetine (PROZAC) 20 MG capsule Take 80 mg by mouth daily.    Provider, Historical, [name]  gabapentin (NEURONTIN) 300 MG capsule TAKE 2 CAPSULES (600 MG TOTAL) BY MOUTH 3 (THREE) TIMES DAILY. Patient not taking: Reported on 06/23/2022 05/03/18   Kelsey [name]  gabapentin (NEURONTIN) 400 MG capsule Take 2 capsules (800 mg total) by mouth 3 (three) times daily. 06/23/22   Kelsey [name]  hydrALAZINE (APRESOLINE) 25 MG tablet Take  25 mg by mouth 3 (three) times daily.    Provider, Historical, [name]  HYDROcodone-acetaminophen (NORCO) 7.5-325 MG tablet Take 1 tablet by mouth every 6 (six) hours as needed for moderate pain. Patient not taking: Reported on 06/23/2022 05/18/22   Kelsey [name]  levETIRAcetam (KEPPRA) 500 MG tablet Take 1 tablet (500 mg total) by mouth 2 (two) times daily. 01/20/22   Catarina Hartshorn, [name]  levonorgestrel (MIRENA) 20 MCG/24HR IUD 1 Intra Uterine Device (1 each total) by Intrauterine route once. 07/16/15   Adonis Brook, NP  nicotine (NICODERM CQ - DOSED IN MG/24 HOURS) 14 mg/24hr patch Place 1 patch (14 mg total) onto the skin daily at 6 (six) AM. 06/24/22   Kelsey [name]  traZODone (DESYREL) 50 MG tablet Take 1 tablet (50 mg total) by mouth at bedtime as needed for sleep. Patient taking differently: Take 50 mg by mouth at bedtime. 06/23/22   Phineas Inches, [name]    Physical Exam: Vitals:   06/23/22 2217 06/23/22 2340 06/24/22 0104 06/24/22 0131  BP: 129/90 99/69 95/60  121/85  Pulse: (!) 113 95 93 87  Resp: 17 18 18 17   Temp: 98.6 F (37 C) 98.5 F (36.9 C) 97.9 F (36.6 C) 98.1 F (36.7 C)  TempSrc: Oral Oral Oral Oral  SpO2: 93% 93% 93% 97%  Weight: 63 kg   64.3 kg  Height: 5\' 5"  (1.651 m)   5\' 4"  (1.626 m)   Physical Exam Vitals and nursing note reviewed.  Constitutional:      General: She is awake. She is not in acute distress.    Appearance: Normal appearance. She is normal weight.  HENT:     Head: Normocephalic.     Mouth/Throat:     Mouth: Mucous membranes are moist.  Eyes:     General: No scleral icterus.    Pupils: Pupils are equal, round, and reactive to light.  Neck:     Vascular: No JVD.  Cardiovascular:     Rate and Rhythm: Normal rate and regular rhythm.     Heart sounds: S1 normal and S2 normal.  Pulmonary:     Effort: Pulmonary effort is normal.     Breath sounds: Normal breath sounds. No wheezing, rhonchi or rales.  Abdominal:     General:  Bowel sounds are normal. There is no distension.     Palpations: Abdomen is soft.     Tenderness: There is no abdominal tenderness. There is no right CVA tenderness, left CVA tenderness or guarding.  Musculoskeletal:     Cervical back: Neck supple.     Right lower leg: No edema.     Left lower leg: No edema.  Skin:    General: Skin is warm and dry.  Neurological:     General: No focal deficit present.     Mental Status: She is alert and oriented to person, place, and time.  Psychiatric:        Mood and Affect: Mood normal.        Behavior: Behavior normal.   Data Reviewed:  There are no new results to review at this time.  Assessment and Plan: Principal Problem:   Seizures ([place]) Observation/PCU. Continue current dose of gabapentin 800 mg TID. Continue Keppra 500 mg p.o. twice daily. Continue lorazepam per CIWA protocol. Consider neurology evaluation if there is recurrence.  Active Problems:   Alcohol use disorder, severe, dependence (HCC) Continue CIWA protocol with lorazepam. Magnesium sulfate supplementation. Folate, MVI and thiamine. Consult TOC.    Asthma Albuterol MDI as needed. Supplemental oxygen as needed.    Essential hypertension Blood pressures low normal. Continue monitoring. Resume therapy if it increases.    Tobacco abuse Declined nicotine replacement therapy.    Mixed hyperlipidemia Therapy with statin deferred given alcohol use.    Macrocytic anemia Monitor hematocrit and hemoglobin. Continue nutritional supplements.    Anxiety with depression Denies suicidal or homicidal ideations. Continue fluoxetine 80 mg p.o. daily. Continue trazodone 50 mg p.o. at bedtime. Behavioral health consult has been requested.    Protein-calorie malnutrition, moderate (HCC) Alcohol cessation. Protein supplementation.    Advance Care Planning:   Code Status: Full Code   Consults:   Family Communication:   Severity of Illness: The appropriate patient  status for this patient is OBSERVATION. Observation status is judged to be reasonable and necessary in order to provide the required intensity of service to ensure the patient's safety. The patient's presenting symptoms, physical exam findings, and initial radiographic and laboratory data in the context of their medical condition is felt to place them at decreased  risk for further clinical deterioration. Furthermore, it is anticipated that the patient will be medically stable for discharge from the hospital within 2 midnights of admission.   Author: Reubin Milan, [name] 06/24/2022 7:57 AM  For on call review [email or web address].   This document was prepared using Dragon voice recognition software and may contain some unintended transcription errors.

## 2022-06-24 NOTE — Progress Notes (Signed)
Pt belongings have been returned to the bedside. Pt is not on any safety or SI precautions and is not a danger to her self or others at this time. Belongings were taken out of tamper proof bags from [facility]. [facility] adult unit states that is okay to open the bags to return the belongings if she is not under any safety precautions.
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
## 2022-06-25 DIAGNOSIS — R569 Unspecified convulsions: Secondary | ICD-10-CM | POA: Diagnosis not present

## 2022-06-25 MED ORDER — POTASSIUM CHLORIDE CRYS ER 20 MEQ PO TBCR
40.0000 meq | EXTENDED_RELEASE_TABLET | Freq: Once | ORAL | Status: AC
  Administered 2022-06-25: 40 meq via ORAL
  Filled 2022-06-25: qty 2

## 2022-06-25 NOTE — Progress Notes (Signed)
PROGRESS NOTE    Kelsey [name]  ASN:[id number] DOB: 28-Oct-1981 DOA: 06/23/2022 PCP: Anabel Halon, MD    Brief Narrative:  Kelsey [name] is a 41 y.o. female with medical history significant for anxiety, asthma, depression, GERD, hypertension, palpitations, perianal abscess, PVC, seasonal allergies, seizure disorder, tachycardia who was sent from the behavioral health hospital where she was admitted for depression, detoxification from alprazolam, alcohol and high-dose gabapentin due to a seizure yesterday afternoon.  She was admitted, but then left the ER to be seen at the behavioral health [facility] from where she was returned back to the emergency department for medical clearance.  Chart review shows that patient can not go to [facility] on gabapentin.   Assessment and Plan:  Seizures (HCC) Continue Keppra 500 mg p.o. twice daily for now Continue ativan per CIWA protocol. -continue gabapentin for now -have discussed briefly with on-call neurology, will need to 1) Confirm that [facility] can not take with gabapentin 2) if not, will plan to half the gabapentin with EEG the next day and increase keppra to 750mg  BID 3)stop gabapenin after several days      Alcohol use disorder, severe, dependence (HCC) Continue CIWA protocol with lorazepam. Magnesium sulfate supplementation. Folate, MVI and thiamine. Consult TOC.     Asthma Albuterol MDI as needed. Supplemental oxygen as needed.     Essential hypertension Blood pressures low normal. Continue monitoring. Resume therapy if it increases.     Tobacco abuse Declined nicotine replacement therapy.     Macrocytic anemia Monitor hematocrit and hemoglobin. Continue nutritional supplements     Anxiety with depression Denies suicidal or homicidal ideations. Continue fluoxetine 80 mg p.o. daily. Continue trazodone 50 mg p.o. at bedtime. Psych consult: No evidence of imminent risk to self or others at present.   Patient does not meet criteria for  psychiatric inpatient admission     Protein-calorie malnutrition, moderate (HCC) Alcohol cessation. Protein supplementation.     DVT prophylaxis: SCDs Start: 06/24/22 0042    Code Status: Full Code Family Communication:   Disposition Plan:  Level of care: Telemetry Status is: Observation The patient will require care spanning > 2 midnights and should be moved to inpatient because: needs alcohol withdrawal    Consultants:  psych   Subjective: Feeling isolated  Objective: Vitals:   06/24/22 1224 06/24/22 1458 06/24/22 2058 06/25/22 0616  BP: 122/88  103/74 120/82  Pulse: 92  92 86  Resp: 18     Temp: 98.1 F (36.7 C)  98.1 F (36.7 C) 97.6 F (36.4 C)  TempSrc: Oral  Oral Oral  SpO2: 97% 97% 96% 96%  Weight:      Height:       No intake or output data in the 24 hours ending 06/25/22 1128 Filed Weights   06/23/22 2217 06/24/22 0131  Weight: 63 kg 64.3 kg    Examination:   General: Appearance:    Well developed, well nourished female who is mildly tremulous     Lungs:      respirations unlabored  Heart:    Normal heart rate. Normal rhythm. No murmurs, rubs, or gallops.    MS:   All extremities are intact.    Neurologic:   Awake, alert       Data Reviewed: I have personally reviewed following labs and imaging studies  CBC: Recent Labs  Lab 06/20/22 0642 06/23/22 1510 06/24/22 0428  WBC 5.6 5.0 5.0  NEUTROABS  --  3.4 2.9  HGB 12.9 12.0  11.2*  HCT 39.8 37.0 34.7*  MCV 101.5* 103.1* 102.7*  PLT 217 226 218   Basic Metabolic Panel: Recent Labs  Lab 06/20/22 0642 06/21/22 0645 06/22/22 0642 06/23/22 1510 06/24/22 0428  NA 139 140 141 137 140  K 2.8* 3.3* 3.7 3.7 3.3*  CL 104 107 110 105 109  CO2 27 25 25 26 26   GLUCOSE 94 93 78 85 86  BUN 14 10 7  <5* <5*  CREATININE 0.65 0.63 0.58 0.66 0.59  CALCIUM 8.6* 8.8* 8.4* 8.8* 8.4*  MG  --   --   --   --  2.1   GFR: Estimated Creatinine Clearance: 79.9 mL/min (by C-G formula based on SCr  of 0.59 mg/dL). Liver Function Tests: Recent Labs  Lab 06/20/22 0642 06/21/22 0645 06/23/22 1510 06/24/22 0428  AST 48* 37 30 23  ALT 53* 45* 30 24  ALKPHOS 100 106 85 72  BILITOT 0.4 0.3 0.6 0.5  PROT 6.5 6.8 6.8 5.8*  ALBUMIN 3.2* 3.4* 3.3* 2.9*   No results for input(s): "LIPASE", "AMYLASE" in the last 168 hours. No results for input(s): "AMMONIA" in the last 168 hours. Coagulation Profile: No results for input(s): "INR", "PROTIME" in the last 168 hours. Cardiac Enzymes: No results for input(s): "CKTOTAL", "CKMB", "CKMBINDEX", "TROPONINI" in the last 168 hours. BNP (last 3 results) No results for input(s): "PROBNP" in the last 8760 hours. HbA1C: No results for input(s): "HGBA1C" in the last 72 hours. CBG: Recent Labs  Lab 06/23/22 1436  GLUCAP 83   Lipid Profile: No results for input(s): "CHOL", "HDL", "LDLCALC", "TRIG", "CHOLHDL", "LDLDIRECT" in the last 72 hours. Thyroid Function Tests: No results for input(s): "TSH", "T4TOTAL", "FREET4", "T3FREE", "THYROIDAB" in the last 72 hours. Anemia Panel: No results for input(s): "VITAMINB12", "FOLATE", "FERRITIN", "TIBC", "IRON", "RETICCTPCT" in the last 72 hours. Sepsis Labs: No results for input(s): "PROCALCITON", "LATICACIDVEN" in the last 168 hours.  Recent Results (from the past 240 hour(s))  SARS Coronavirus 2 by RT PCR (hospital order, performed in [facility] hospital lab) *cepheid single result test* Anterior Nasal Swab     Status: None   Collection Time: 06/19/22  9:07 PM   Specimen: Anterior Nasal Swab  Result Value Ref Range Status   SARS Coronavirus 2 by RT PCR NEGATIVE NEGATIVE Final    Comment: (NOTE) SARS-CoV-2 target nucleic acids are NOT DETECTED.  The SARS-CoV-2 RNA is generally detectable in upper and lower respiratory specimens during the acute phase of infection. The lowest concentration of SARS-CoV-2 viral copies this assay can detect is 250 copies / mL. A negative result does not preclude  SARS-CoV-2 infection and should not be used as the sole basis for treatment or other patient management decisions.  A negative result may occur with improper specimen collection / handling, submission of specimen other than nasopharyngeal swab, presence of viral mutation(s) within the areas targeted by this assay, and inadequate number of viral copies (<250 copies / mL). A negative result must be combined with clinical observations, patient history, and epidemiological information.  Fact Sheet for Patients:   [facility]  Fact Sheet for Healthcare Providers: 06/21/22  This test is not yet approved or  cleared by the [employer] FDA and has been authorized for detection and/or diagnosis of SARS-CoV-2 by FDA under an Emergency Use Authorization (EUA).  This EUA will remain in effect (meaning this test can be used) for the duration of the COVID-19 declaration under Section 564(b)(1) of the Act, 21 U.S.C. section 360bbb-3(b)(1), unless the  authorization is terminated or revoked sooner.  Performed at [facility], [address], [place], [place] [address]          Radiology Studies: No results found.      Scheduled Meds:  FLUoxetine  80 mg Oral Daily   folic acid  1 mg Oral Daily   gabapentin  800 mg Oral TID   levETIRAcetam  500 mg Oral BID   multivitamin with minerals  1 tablet Oral Daily   thiamine  100 mg Oral Daily   traZODone  50 mg Oral QHS   Continuous Infusions:   LOS: 0 days    Time spent: 45 minutes spent on chart review, discussion with nursing staff, consultants, updating family and interview/physical exam; more than 50% of that time was spent in counseling and/or coordination of care.    Joseph Art, DO [facility] Available via Epic secure chat 7am-7pm After these hours, please refer to coverage provider listed on [email or web address] 06/25/2022, 11:28 AM

## 2022-06-26 DIAGNOSIS — R569 Unspecified convulsions: Secondary | ICD-10-CM | POA: Diagnosis not present

## 2022-06-26 MED ORDER — FOLIC ACID 1 MG PO TABS: 1.0000 mg | ORAL_TABLET | Freq: Every day | ORAL | 0 refills | Status: DC

## 2022-06-26 MED ORDER — THIAMINE HCL 100 MG PO TABS: 100.0000 mg | ORAL_TABLET | Freq: Every day | ORAL | 0 refills | Status: DC

## 2022-06-26 MED ORDER — TRAZODONE HCL 50 MG PO TABS: 50.0000 mg | ORAL_TABLET | Freq: Every day | ORAL | 0 refills | Status: DC

## 2022-06-26 MED ORDER — ADULT MULTIVITAMIN W/MINERALS CH: 1.0000 | ORAL_TABLET | Freq: Every day | ORAL | Status: DC

## 2022-06-26 NOTE — Discharge Summary (Signed)
Physician Discharge Summary  Kelsey [name] FBP:[id number] DOB: 1980/12/09 DOA: 06/23/2022  PCP: Anabel Halon, MD  Admit date: 06/23/2022 Discharge date: 06/26/2022  Admitted From: [facility] Discharge disposition: home   Recommendations for Outpatient Follow-Up:   Patient making arrangement to go to rehab that will take her on gabapentin- [place], [place] or [place] Seizure precautions     Discharge Diagnosis:   Principal Problem:   Seizures (HCC) Active Problems:   Asthma   Essential hypertension   Tobacco abuse   Mixed hyperlipidemia   Macrocytic anemia   Alcohol use disorder, severe, dependence (HCC)   Anxiety with depression   Protein-calorie malnutrition, moderate (HCC)    Discharge Condition: Improved.  Diet recommendation:   Regular.  Wound care: None.  Code status: Full.   History of Present Illness:   MAKAYLAH ODDO is a 41 y.o. female with medical history significant for anxiety, asthma, depression, GERD, hypertension, palpitations, perianal abscess, PVC, seasonal allergies, seizure disorder, tachycardia who was sent from the behavioral health hospital where she was admitted for depression, detoxification from alprazolam, alcohol and high-dose gabapentin due to a seizure yesterday afternoon.  She was admitted, but then left the ER to be seen at the behavioral health UC from where she was returned back to the emergency department for medical clearance.  She stated she felt anxious and with tremors, but much better after she received lorazepam IV.  She was wheezing, but this resolved after albuterol MDI. No fever, chills or night sweats. No sore throat, rhinorrhea or hemoptysis.  No chest pain, palpitations, diaphoresis, PND, orthopnea or pitting edema of the lower extremities.  No appetite changes, abdominal pain, diarrhea, constipation, melena or hematochezia.  No flank pain, dysuria, frequency or hematuria.  No polyuria, polydipsia, polyphagia or  blurred vision.  Has some trouble sleeping last night.  Denied SI or HI.   Hospital Course by Problem:   Seizures (HCC) Continue Keppra 500 mg p.o. twice daily for now -no need for ativan currently- -much improved with tremors -have discussed briefly with on-call neurology, will need to 1) Confirm that FH can not take with gabapentin 2) if not, will plan to half the gabapentin with EEG the next day and increase keppra to 750mg  BID 3)stop gabapenin after several days     Alcohol use disorder, severe, dependence (HCC) Continue CIWA protocol with lorazepam. Magnesium sulfate supplementation. Folate, MVI and thiamine. Consult TOC.     Asthma Albuterol MDI as needed. Supplemental oxygen as needed.     Essential hypertension Blood pressures low normal. Continue monitoring. Resume therapy if it increases.   Hypokalemia -replete    Tobacco abuse Declined nicotine replacement therapy.     Macrocytic anemia Monitor hematocrit and hemoglobin. Continue nutritional supplements     Anxiety with depression Denies suicidal or homicidal ideations. Continue fluoxetine 80 mg p.o. daily. Continue trazodone 50 mg p.o. at bedtime. Psych consult: No evidence of imminent risk to self or others at present.   Patient does not meet criteria for psychiatric inpatient admission     Protein-calorie malnutrition, moderate (HCC) Alcohol cessation. Protein supplementation.    Medical Consultants:   Neuro (phone) psych   Discharge Exam:   Vitals:   06/25/22 2111 06/26/22 0458  BP: 112/81 103/74  Pulse: 94 93  Resp: 20 17  Temp: 98 F (36.7 C) 98.3 F (36.8 C)  SpO2: 92% 95%   Vitals:   06/25/22 1707 06/25/22 2111 06/26/22 0458 06/26/22 0459  BP:  110/89 112/81 103/74   Pulse: 97 94 93   Resp: 14 20 17    Temp: 98.7 F (37.1 C) 98 F (36.7 C) 98.3 F (36.8 C)   TempSrc: Oral Oral    SpO2: 98% 92% 95%   Weight:    64.5 kg  Height:        General exam: Appears calm and  comfortable.    The results of significant diagnostics from this hospitalization (including imaging, microbiology, ancillary and laboratory) are listed below for reference.     Procedures and Diagnostic Studies:   No results found.   Labs:   Basic Metabolic Panel: Recent Labs  Lab 06/20/22 0642 06/21/22 0645 06/22/22 0642 06/23/22 1510 06/24/22 0428  NA 139 140 141 137 140  K 2.8* 3.3* 3.7 3.7 3.3*  CL 104 107 110 105 109  CO2 27 25 25 26 26   GLUCOSE 94 93 78 85 86  BUN 14 10 7  <5* <5*  CREATININE 0.65 0.63 0.58 0.66 0.59  CALCIUM 8.6* 8.8* 8.4* 8.8* 8.4*  MG  --   --   --   --  2.1   GFR Estimated Creatinine Clearance: 79.9 mL/min (by C-G formula based on SCr of 0.59 mg/dL). Liver Function Tests: Recent Labs  Lab 06/20/22 0642 06/21/22 0645 06/23/22 1510 06/24/22 0428  AST 48* 37 30 23  ALT 53* 45* 30 24  ALKPHOS 100 106 85 72  BILITOT 0.4 0.3 0.6 0.5  PROT 6.5 6.8 6.8 5.8*  ALBUMIN 3.2* 3.4* 3.3* 2.9*   No results for input(s): "LIPASE", "AMYLASE" in the last 168 hours. No results for input(s): "AMMONIA" in the last 168 hours. Coagulation profile No results for input(s): "INR", "PROTIME" in the last 168 hours.  CBC: Recent Labs  Lab 06/20/22 0642 06/23/22 1510 06/24/22 0428  WBC 5.6 5.0 5.0  NEUTROABS  --  3.4 2.9  HGB 12.9 12.0 11.2*  HCT 39.8 37.0 34.7*  MCV 101.5* 103.1* 102.7*  PLT 217 226 218   Cardiac Enzymes: No results for input(s): "CKTOTAL", "CKMB", "CKMBINDEX", "TROPONINI" in the last 168 hours. BNP: Invalid input(s): "POCBNP" CBG: Recent Labs  Lab 06/23/22 1436  GLUCAP 83   D-Dimer No results for input(s): "DDIMER" in the last 72 hours. Hgb A1c No results for input(s): "HGBA1C" in the last 72 hours. Lipid Profile No results for input(s): "CHOL", "HDL", "LDLCALC", "TRIG", "CHOLHDL", "LDLDIRECT" in the last 72 hours. Thyroid function studies No results for input(s): "TSH", "T4TOTAL", "T3FREE", "THYROIDAB" in the last 72  hours.  Invalid input(s): "FREET3" Anemia work up No results for input(s): "VITAMINB12", "FOLATE", "FERRITIN", "TIBC", "IRON", "RETICCTPCT" in the last 72 hours. Microbiology Recent Results (from the past 240 hour(s))  SARS Coronavirus 2 by RT PCR (hospital order, performed in [facility] hospital lab) *cepheid single result test* Anterior Nasal Swab     Status: None   Collection Time: 06/19/22  9:07 PM   Specimen: Anterior Nasal Swab  Result Value Ref Range Status   SARS Coronavirus 2 by RT PCR NEGATIVE NEGATIVE Final    Comment: (NOTE) SARS-CoV-2 target nucleic acids are NOT DETECTED.  The SARS-CoV-2 RNA is generally detectable in upper and lower respiratory specimens during the acute phase of infection. The lowest concentration of SARS-CoV-2 viral copies this assay can detect is 250 copies / mL. A negative result does not preclude SARS-CoV-2 infection and should not be used as the sole basis for treatment or other patient management decisions.  A negative result may occur with improper  specimen collection / handling, submission of specimen other than nasopharyngeal swab, presence of viral mutation(s) within the areas targeted by this assay, and inadequate number of viral copies (<250 copies / mL). A negative result must be combined with clinical observations, patient history, and epidemiological information.  Fact Sheet for Patients:   RoadLapTop.co.za  Fact Sheet for Healthcare Providers: http://kim-miller.com/  This test is not yet approved or  cleared by the [place] FDA and has been authorized for detection and/or diagnosis of SARS-CoV-2 by FDA under an Emergency Use Authorization (EUA).  This EUA will remain in effect (meaning this test can be used) for the duration of the COVID-19 declaration under Section 564(b)(1) of the Act, 21 U.S.C. section 360bbb-3(b)(1), unless the authorization is terminated or revoked  sooner.  Performed at [facility], [address], [place], [place] [address]      Discharge Instructions:   Discharge Instructions     Diet general   Complete by: As directed    Discharge instructions   Complete by: As directed    Patient to arrange inpatient rehab-- looking at [facility] and [place]   Increase activity slowly   Complete by: As directed       Allergies as of 06/26/2022       Reactions   Penicillins Rash   Has patient had a PCN reaction causing immediate rash, facial/tongue/throat swelling, SOB or lightheadedness with hypotension: No Has patient had a PCN reaction causing severe rash involving mucus membranes or skin necrosis: No Has patient had a PCN reaction that required hospitalization No Has patient had a PCN reaction occurring within the last 10 years: No If all of the above answers are "NO", then may proceed with Cephalosporin use.        Medication List     STOP taking these medications    ALPRAZolam 1 MG tablet Commonly known as: XANAX   amLODipine 10 MG tablet Commonly known as: NORVASC   cyclobenzaprine 10 MG tablet Commonly known as: FLEXERIL   diphenhydrAMINE 25 MG tablet Commonly known as: BENADRYL   hydrALAZINE 25 MG tablet Commonly known as: APRESOLINE   HYDROcodone-acetaminophen 7.5-325 MG tablet Commonly known as: Norco       TAKE these medications    albuterol 108 (90 Base) MCG/ACT inhaler Commonly known as: VENTOLIN HFA Inhale 1-2 puffs into the lungs every 6 (six) hours as needed for wheezing or shortness of breath.   FLUoxetine 20 MG capsule Commonly known as: PROZAC Take 80 mg by mouth daily.   folic acid 1 MG tablet Commonly known as: FOLVITE Take 1 tablet (1 mg total) by mouth daily.   gabapentin 400 MG capsule Commonly known as: NEURONTIN Take 2 capsules (800 mg total) by mouth 3 (three) times daily. What changed: Another medication with the same name was removed.  Continue taking this medication, and follow the directions you see here.   levETIRAcetam 500 MG tablet Commonly known as: Keppra Take 1 tablet (500 mg total) by mouth 2 (two) times daily.   levonorgestrel 20 MCG/24HR IUD Commonly known as: MIRENA 1 Intra Uterine Device (1 each total) by Intrauterine route once.   multivitamin with minerals Tabs tablet Take 1 tablet by mouth daily.   nicotine 14 mg/24hr patch Commonly known as: NICODERM CQ - dosed in mg/24 hours Place 1 patch (14 mg total) onto the skin daily at 6 (six) AM.   thiamine 100 MG tablet Commonly known as: VITAMIN B1 Take 1 tablet (100  mg total) by mouth daily.   traZODone 50 MG tablet Commonly known as: DESYREL Take 1 tablet (50 mg total) by mouth at bedtime as needed for sleep. What changed: when to take this        Follow-up Information     Anabel Halon, MD Follow up.   Specialty: Internal Medicine Contact information: [address] [place] [address] [phone]                  Time coordinating discharge: 45 min  Signed:  Joseph Art DO  [facility] 06/26/2022, 9:49 AM

## 2022-06-26 NOTE — TOC Transition Note (Signed)
Transition of Care [facility]) - CM/SW Discharge Note   Patient Details  Name: Kelsey [name] MRN: [id number] Date of Birth: 11-Mar-1981  [facility]) CM/SW Contact:  Golda Acre, RN Phone Number: 06/26/2022, 9:55 AM   Clinical Narrative:    Pt dcd to return home has list of substance abuse resources and is planning on attending one of the inpatient facility.       Barriers to Discharge: Continued Medical Work up   Patient Goals and CMS Choice Patient states their goals for this hospitalization and ongoing recovery are:: [facility] CMS Medicare.gov Compare Post Acute Care list provided to:: Patient Choice offered to / list presented to : Patient  Discharge Placement                       Discharge Plan and Services   Discharge Planning Services: CM Consult, Other - See comment ([facility])                                 Social Determinants of Health (SDOH) Interventions     Readmission Risk Interventions     No data to display

## 2022-06-26 NOTE — TOC Initial Note (Signed)
Transition of Care [facility]) - Initial/Assessment Note    Patient Details  Name: Kelsey [name] MRN: [id number] Date of Birth: 13-Feb-1981  Transition [facility] Care [facility]) CM/SW Contact:    Golda Acre, RN Phone Number: 06/26/2022, 9:38 AM  Clinical Narrative:                 [facility]  Expected Discharge Plan: Home/Self Care Barriers to Discharge: Continued Medical Work up   Patient Goals and CMS Choice Patient states their goals for this hospitalization and ongoing recovery are:: to [facility] CMS Medicare.gov Compare Post Acute Care list provided to:: Patient Choice offered to / list presented to : Patient  Expected Discharge Plan and Services Expected Discharge Plan: Home/Self Care   Discharge Planning Services: CM Consult, Other - See comment ([facility])   Living arrangements for the past 2 months: Single Family Home                                      Prior Living Arrangements/Services Living arrangements for the past 2 months: Single Family Home Lives with:: Spouse Patient language and need for interpreter reviewed:: Yes Do you feel safe going back to the place where you live?: Yes            Criminal Activity/Legal Involvement Pertinent to Current Situation/Hospitalization: No - Comment as needed  Activities of Daily Living Home Assistive Devices/Equipment: None ADL Screening (condition at time of admission) Patient's cognitive ability adequate to safely complete daily activities?: Yes Is the patient deaf or have difficulty hearing?: No Does the patient have difficulty seeing, even when wearing glasses/contacts?: No Does the patient have difficulty concentrating, remembering, or making decisions?: No Patient able to express need for assistance with ADLs?: Yes Does the patient have difficulty dressing or bathing?: No Independently performs ADLs?: Yes (appropriate for developmental age) Does the  patient have difficulty walking or climbing stairs?: No Weakness of Legs: None Weakness of Arms/Hands: None  Permission Sought/Granted                  Emotional Assessment Appearance:: Appears stated age Attitude/Demeanor/Rapport: Engaged Affect (typically observed): Calm Orientation: : Oriented to Self, Oriented to Place, Oriented to  Time, Oriented to Situation Alcohol / Substance Use: Alcohol Use Psych Involvement: No (comment)  Admission diagnosis:  Seizures (HCC) [R56.9] Alcohol withdrawal seizure without complication (HCC) [F10.930, R56.9] Patient Active Problem List   Diagnosis Date Noted   Seizures (HCC) 06/24/2022   Anxiety with depression 06/24/2022   Protein-calorie malnutrition, moderate (HCC) 06/24/2022   Alcohol withdrawal seizure (HCC) 06/23/2022   Alcohol use disorder, severe, dependence (HCC) 06/20/2022   MDD (major depressive disorder), recurrent severe, without psychosis (HCC) 06/20/2022   Tobacco use disorder 06/20/2022   Cannabis abuse 06/20/2022   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   Hospital discharge follow-up 02/23/2022   Status post left hip replacement 02/17/2022   Transaminitis 01/26/2022   Macrocytic anemia 01/26/2022   Thrombocytopenia (HCC) 01/18/2022   Avascular necrosis of bone of left hip (HCC) 12/20/2021   Avascular necrosis of bone of right hip (HCC) 12/20/2021   Lumbar spondylosis 10/05/2021   Positive double stranded DNA antibody test 09/07/2021   Mixed hyperlipidemia 08/16/2021   Seizure disorder (HCC) 07/28/2021   Chronic hip pain 07/28/2021   Essential hypertension 07/28/2021   Tobacco abuse 07/28/2021  Polyarthritis 07/28/2021   Anal skin tag 10/21/2019   Internal hemorrhoids 09/26/2017   Asthma 10/15/2015   Allergic rhinitis 10/15/2015   Alcohol abuse 07/14/2015   GAD (generalized anxiety disorder) 07/14/2015   Depression, major, recurrent, moderate (HCC) 07/14/2015   GERD (gastroesophageal reflux  disease) 08/22/2013   PCP:  Anabel Halon, MD Pharmacy:   [facility] [phone] - [place], [place] - [address] AT [facility] [address] [place] [address] Phone: [phone] Fax: [phone]  [id number] - [place], [place] - [address] AT [address] [place] [address] Phone: [phone] Fax: [phone]  [id number] [place], [place] - [address] AT [facility] OF [facility] [address] [place] [address] Phone: [phone] Fax: [phone]  [id number] [place], [place] - [address] [place] [address] Phone: [phone] Fax: [phone]     Social Determinants of Health (SDOH) Interventions    Readmission Risk Interventions     No data to display

## 2022-06-27 ENCOUNTER — Telehealth: Payer: Self-pay | Admitting: *Deleted

## 2022-06-27 NOTE — Telephone Encounter (Signed)
Transition Care Management Unsuccessful Follow-up Telephone Call  Date of discharge and from where:  06-23-22 Kelsey [name]  Attempts:  1st Attempt  Reason for unsuccessful TCM follow-up call:  Left voice message

## 2022-06-29 ENCOUNTER — Encounter (HOSPITAL_COMMUNITY): Payer: Self-pay

## 2022-06-29 ENCOUNTER — Emergency Department (HOSPITAL_COMMUNITY)
Admission: EM | Admit: 2022-06-29 | Discharge: 2022-06-29 | Disposition: A | Discharge status: Home / Self Care | Payer: Commercial Managed Care - PPO | Attending: Student | Admitting: Student

## 2022-06-29 DIAGNOSIS — F17210 Nicotine dependence, cigarettes, uncomplicated: Secondary | ICD-10-CM | POA: Insufficient documentation

## 2022-06-29 DIAGNOSIS — D582 Other hemoglobinopathies: Secondary | ICD-10-CM | POA: Insufficient documentation

## 2022-06-29 DIAGNOSIS — I10 Essential (primary) hypertension: Secondary | ICD-10-CM | POA: Diagnosis not present

## 2022-06-29 DIAGNOSIS — E871 Hypo-osmolality and hyponatremia: Secondary | ICD-10-CM | POA: Insufficient documentation

## 2022-06-29 DIAGNOSIS — R112 Nausea with vomiting, unspecified: Secondary | ICD-10-CM

## 2022-06-29 DIAGNOSIS — D75839 Thrombocytosis, unspecified: Secondary | ICD-10-CM | POA: Insufficient documentation

## 2022-06-29 DIAGNOSIS — Z96643 Presence of artificial hip joint, bilateral: Secondary | ICD-10-CM | POA: Diagnosis not present

## 2022-06-29 DIAGNOSIS — J45909 Unspecified asthma, uncomplicated: Secondary | ICD-10-CM | POA: Diagnosis not present

## 2022-06-29 DIAGNOSIS — F1010 Alcohol abuse, uncomplicated: Secondary | ICD-10-CM

## 2022-06-29 LAB — COMPREHENSIVE METABOLIC PANEL
ALT: 23 U/L (ref 0–44)
AST: 27 U/L (ref 15–41)
Albumin: 3.9 g/dL (ref 3.5–5.0)
Alkaline Phosphatase: 96 U/L (ref 38–126)
Anion gap: 10 (ref 5–15)
BUN: 16 mg/dL (ref 6–20)
CO2: 21 mmol/L — ABNORMAL LOW (ref 22–32)
Calcium: 9.2 mg/dL (ref 8.9–10.3)
Chloride: 101 mmol/L (ref 98–111)
Creatinine, Ser: 0.62 mg/dL (ref 0.44–1.00)
GFR, Estimated: >60 mL/min (ref >60)
Glucose, Bld: 127 mg/dL — ABNORMAL HIGH (ref 70–99)
Potassium: 3.6 mmol/L (ref 3.5–5.1)
Sodium: 132 mmol/L — ABNORMAL LOW (ref 135–145)
Total Bilirubin: 0.9 mg/dL (ref 0.3–1.2)
Total Protein: 8 g/dL (ref 6.5–8.1)

## 2022-06-29 LAB — LIPASE, BLOOD: Lipase: 20 U/L (ref 11–51)

## 2022-06-29 LAB — URINALYSIS, ROUTINE W REFLEX MICROSCOPIC
Bilirubin Urine: NEGATIVE
Glucose, UA: NEGATIVE mg/dL
Hgb urine dipstick: NEGATIVE
Ketones, ur: 5 mg/dL — AB
Nitrite: NEGATIVE
Protein, ur: 100 mg/dL — AB
Specific Gravity, Urine: 1.04 — ABNORMAL HIGH (ref 1.005–1.030)
Squamous Epithelial / HPF: >50 — ABNORMAL HIGH (ref 0–5)
pH: 5 (ref 5.0–8.0)

## 2022-06-29 LAB — POC URINE PREG, ED: Preg Test, Ur: NEGATIVE

## 2022-06-29 LAB — CBC
HCT: 46.2 % — ABNORMAL HIGH (ref 36.0–46.0)
Hemoglobin: 15.7 g/dL — ABNORMAL HIGH (ref 12.0–15.0)
MCH: 32.9 pg (ref 26.0–34.0)
MCHC: 34 g/dL (ref 30.0–36.0)
MCV: 96.9 fL (ref 80.0–100.0)
Platelets: 417 10*3/uL — ABNORMAL HIGH (ref 150–400)
RBC: 4.77 MIL/uL (ref 3.87–5.11)
RDW: 17.2 % — ABNORMAL HIGH (ref 11.5–15.5)
WBC: 5.6 10*3/uL (ref 4.0–10.5)
nRBC: 0 % (ref 0.0–0.2)

## 2022-06-29 MED ORDER — LORAZEPAM 1 MG PO TABS
1.0000 mg | ORAL_TABLET | Freq: Once | ORAL | Status: DC
  Filled 2022-06-29: qty 1

## 2022-06-29 MED ORDER — GABAPENTIN 400 MG PO CAPS
800.0000 mg | ORAL_CAPSULE | Freq: Once | ORAL | Status: AC
Start: 2022-06-29 — End: 2022-06-29
  Administered 2022-06-29: 800 mg via ORAL
  Filled 2022-06-29: qty 2

## 2022-06-29 MED ORDER — LACTATED RINGERS IV BOLUS
1000.0000 mL | Freq: Once | INTRAVENOUS | Status: AC
  Administered 2022-06-29: 1000 mL via INTRAVENOUS

## 2022-06-29 MED ORDER — ONDANSETRON HCL 4 MG/2ML IJ SOLN
4.0000 mg | Freq: Once | INTRAMUSCULAR | Status: AC
Start: 2022-06-29 — End: 2022-06-29
  Administered 2022-06-29: 4 mg via INTRAVENOUS
  Filled 2022-06-29: qty 2

## 2022-06-29 MED ORDER — ONDANSETRON 4 MG PO TBDP: 4.0000 mg | ORAL_TABLET | Freq: Three times a day (TID) | ORAL | 0 refills | Status: DC | PRN

## 2022-06-29 MED ORDER — LORAZEPAM 2 MG/ML IJ SOLN
1.0000 mg | Freq: Once | INTRAMUSCULAR | Status: DC
Start: 2022-06-29 — End: 2022-06-29

## 2022-06-29 MED ORDER — FAMOTIDINE IN NACL 20-0.9 MG/50ML-% IV SOLN
20.0000 mg | Freq: Once | INTRAVENOUS | Status: AC
Start: 2022-06-29 — End: 2022-06-29
  Administered 2022-06-29: 20 mg via INTRAVENOUS
  Filled 2022-06-29: qty 50

## 2022-06-29 MED ORDER — CHLORDIAZEPOXIDE HCL 25 MG PO CAPS: ORAL_CAPSULE | ORAL | 0 refills | Status: DC

## 2022-06-29 NOTE — ED Provider Notes (Signed)
[facility]Y [facility] Provider Note  CSN: [id number] Arrival date & time: 06/29/22 [id number]  Chief Complaint(s) Emesis  HPI Kelsey RailJennifer L [name] is a 41 y.o. female with PMH alcohol abuse, seizure disorder, major depression who presents emergency department for evaluation of nausea vomiting.  Patient was recently discharged for alcohol withdrawal on 06/26/2022 and states that she started drinking the following day.  She states her last drink was last night but feels that today is more like a "stomach bug" and alcohol drawl as she does not have any tremors and does not currently feel like she is in alcohol withdrawal.  However, she states has been unable to tolerate p.o. with persistent vomiting.  Denies abdominal pain, diarrhea, headache, fever or other systemic symptoms.   Past Medical History Past Medical History:  Diagnosis Date   Anxiety    Asthma    hosp. 2018 and 2019 no h/o intubation    Depression    GERD (gastroesophageal reflux disease)    Hypertension    Palpitations    Perianal abscess 11/22/2016   12/07/2016.  [facility] [place]  Ovidio Kinavid Newman, MD.  Perianal abscess is much better but still needs local wound care.  Continue sitz baths 2-3 times perday until all drainage and pain are gone.  Follow up as needed.   Preop general physical exam 01/26/2022   PVC (premature ventricular contraction)    Seasonal allergies    Seizures (HCC)    01/15/22 was last seizure   Tachycardia    Patient Active Problem List   Diagnosis Date Noted   Seizures (HCC) 06/24/2022   Anxiety with depression 06/24/2022   Protein-calorie malnutrition, moderate (HCC) 06/24/2022   Alcohol withdrawal seizure (HCC) 06/23/2022   Alcohol use disorder, severe, dependence (HCC) 06/20/2022   MDD (major depressive disorder), recurrent severe, without psychosis (HCC) 06/20/2022   Tobacco use disorder 06/20/2022   Cannabis abuse 06/20/2022   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    Hospital discharge follow-up 02/23/2022   Status post left hip replacement 02/17/2022   Transaminitis 01/26/2022   Macrocytic anemia 01/26/2022   Thrombocytopenia (HCC) 01/18/2022   Avascular necrosis of bone of left hip (HCC) 12/20/2021   Avascular necrosis of bone of right hip (HCC) 12/20/2021   Lumbar spondylosis 10/05/2021   Positive double stranded DNA antibody test 09/07/2021   Mixed hyperlipidemia 08/16/2021   Seizure disorder (HCC) 07/28/2021   Chronic hip pain 07/28/2021   Essential hypertension 07/28/2021   Tobacco abuse 07/28/2021   Polyarthritis 07/28/2021   Anal skin tag 10/21/2019   Internal hemorrhoids 09/26/2017   Asthma 10/15/2015   Allergic rhinitis 10/15/2015   Alcohol abuse 07/14/2015   GAD (generalized anxiety disorder) 07/14/2015   Depression, major, recurrent, moderate (HCC) 07/14/2015   GERD (gastroesophageal reflux disease) 08/22/2013   Home Medication(s) Prior to Admission medications   Medication Sig Start Date End Date Taking? Authorizing Provider  albuterol (VENTOLIN HFA) 108 (90 Base) MCG/ACT inhaler Inhale 1-2 puffs into the lungs every 6 (six) hours as needed for wheezing or shortness of breath.    [provider]  FLUoxetine (PROZAC) 20 MG capsule Take 80 mg by mouth daily.    [provider]  folic acid (FOLVITE) 1 MG tablet Take 1 tablet (1 mg total) by mouth daily. 06/26/22   Joseph ArtVann, Jessica U, DO  gabapentin (NEURONTIN) 400 MG capsule Take 2 capsules (800 mg total) by mouth 3 (three) times daily. 06/23/22   Phineas InchesMassengill, Nathan, MD  levETIRAcetam (KEPPRA)  500 MG tablet Take 1 tablet (500 mg total) by mouth 2 (two) times daily. 01/20/22   Catarina Hartshorn, MD  levonorgestrel (MIRENA) 20 MCG/24HR IUD 1 Intra Uterine Device (1 each total) by Intrauterine route once. 07/16/15   Adonis Brook, NP  Multiple Vitamin (MULTIVITAMIN WITH MINERALS) TABS tablet Take 1 tablet by mouth daily. 06/26/22   Joseph Art, DO  nicotine (NICODERM CQ - DOSED  IN MG/24 HOURS) 14 mg/24hr patch Place 1 patch (14 mg total) onto the skin daily at 6 (six) AM. 06/24/22   Massengill, Harrold Donath, MD  thiamine (VITAMIN B1) 100 MG tablet Take 1 tablet (100 mg total) by mouth daily. 06/26/22   Joseph Art, DO  traZODone (DESYREL) 50 MG tablet Take 1 tablet (50 mg total) by mouth at bedtime. 06/26/22   Joseph Art, DO                                                                                                                                    Past Surgical History Past Surgical History:  Procedure Laterality Date   CESAREAN SECTION  10/15/2009, 02/08/2006   INCISION AND DRAINAGE PERIRECTAL ABSCESS Bilateral 11/22/2016   Procedure: IRRIGATION AND DEBRIDEMENT PERIRECTAL ABSCESS;  Surgeon: Ovidio Kin, MD;  Location: [facility];  Service: General;  Laterality: Bilateral;   INTRAUTERINE DEVICE INSERTION     Mirena 12/24/19 exp 12/28/21 Dr Philip Aspen [facility]   TOTAL HIP ARTHROPLASTY Left 02/17/2022   Procedure: Left TOTAL HIP ARTHROPLASTY ANTERIOR APPROACH;  Surgeon: Kathryne Hitch, MD;  Location: [facility];  Service: Orthopedics;  Laterality: Left;   TOTAL HIP ARTHROPLASTY Right 05/05/2022   Procedure: RIGHT TOTAL HIP ARTHROPLASTY ANTERIOR APPROACH;  Surgeon: Kathryne Hitch, MD;  Location: [facility];  Service: Orthopedics;  Laterality: Right;   Family History Family History  Problem Relation Age of Onset   Rheumatologic disease Mother    Arthritis Mother    Asthma Mother    Depression Mother    Heart disease Father    Asthma Maternal Grandmother    Hypertension Maternal Grandmother    Heart disease Maternal Grandfather    Lung cancer Paternal Grandmother    Healthy Daughter    Healthy Son     Social History Social History   Tobacco Use   Smoking status: Every Day    Packs/day: 0.25    Years: 10.00    Total pack years: 2.50    Types: Cigarettes   Smokeless tobacco: Never  Vaping Use   Vaping Use: Never used  Substance  Use Topics   Alcohol use: Yes   Drug use: Yes    Types: Marijuana    Comment: yesterday   Allergies Penicillins  Review of Systems Review of Systems  Gastrointestinal:  Positive for nausea and vomiting.    Physical Exam Vital Signs  I have reviewed the triage vital signs BP (!) 164/112   Pulse  85   Temp 98.2 F (36.8 C) (Oral)   Resp 11   SpO2 97%   Physical Exam Vitals and nursing note reviewed.  Constitutional:      General: She is not in acute distress.    Appearance: She is well-developed.  HENT:     Head: Normocephalic and atraumatic.  Eyes:     Conjunctiva/sclera: Conjunctivae normal.  Cardiovascular:     Rate and Rhythm: Normal rate and regular rhythm.     Heart sounds: No murmur heard. Pulmonary:     Effort: Pulmonary effort is normal. No respiratory distress.     Breath sounds: Normal breath sounds.  Abdominal:     Palpations: Abdomen is soft.     Tenderness: There is no abdominal tenderness.  Musculoskeletal:        General: No swelling.     Cervical back: Neck supple.  Skin:    General: Skin is warm and dry.     Capillary Refill: Capillary refill takes less than 2 seconds.  Neurological:     Mental Status: She is alert.  Psychiatric:        Mood and Affect: Mood normal.     ED Results and Treatments Labs (all labs ordered are listed, but only abnormal results are displayed) Labs Reviewed  COMPREHENSIVE METABOLIC PANEL - Abnormal; Notable for the following components:      Result Value   Sodium 132 (*)    CO2 21 (*)    Glucose, Bld 127 (*)    All other components within normal limits  CBC - Abnormal; Notable for the following components:   Hemoglobin 15.7 (*)    HCT 46.2 (*)    RDW 17.2 (*)    Platelets 417 (*)    All other components within normal limits  URINALYSIS, ROUTINE W REFLEX MICROSCOPIC - Abnormal; Notable for the following components:   Color, Urine AMBER (*)    APPearance CLOUDY (*)    Specific Gravity, Urine 1.040 (*)     Ketones, ur 5 (*)    Protein, ur 100 (*)    Leukocytes,Ua MODERATE (*)    Bacteria, UA RARE (*)    Squamous Epithelial / LPF >50 (*)    All other components within normal limits  LIPASE, BLOOD  POC URINE PREG, ED                                                                                                                          Radiology No results found.  Pertinent labs & imaging results that were available during my care of the patient were reviewed by me and considered in my medical decision making (see MDM for details).  Medications Ordered in ED Medications  ondansetron (ZOFRAN) injection 4 mg (4 mg Intravenous Given 06/29/22 0734)  lactated ringers bolus 1,000 mL (1,000 mLs Intravenous New Bag/Given 06/29/22 0734)  Procedures Procedures  (including critical care time)  Medical Decision Making / ED Course   This patient presents to the ED for concern of nausea and vomiting, this involves an extensive number of treatment options, and is a complaint that carries with it a high risk of complications and morbidity.  The differential diagnosis includes alcoholic gastritis, alcohol withdrawal, gastroenteritis, gastroparesis  MDM: Patient seen emergency department for evaluation of nausea and vomiting.  Physical exam with a very mild tremor but no appreciable abdominal tenderness on exam.  Laboratory evaluation with a mild hyponatremia 132, hemoglobin elevated to 15.7, platelets 417 but is otherwise unremarkable.  Lipase normal.  Urinalysis with moderate leuk esterase but has 11-20 red blood cells, 21-50 white blood cells but greater than 50 squamous epithelial cells.  Suspect that this is likely a contaminated sample and thus a urine culture was sent.  Patient will be called with appropriate antibiotic therapy if culture is positive.  Patient given  Zofran and Pepcid and on reevaluation her symptoms have dramatically improved.  She is able to tolerate p.o. without difficulty and patient was given her home gabapentin.  On reevaluation after this her tremors have improved.  Although the patient is certainly at risk for alcohol withdrawal, she does not appear to show signs of active life-threatening withdrawal here in the emergency department.  I did offer to initiate her Ativan therapy here but she does not have a ride home and is thus not safe to take this medication and then operate a moving vehicle.  Thus we shared decision making for the patient to pick up a prescription for a Librium taper and use this at home to ensure that she does not withdrawal.  I was very clear with the patient that she cannot drink alcohol while taking this medication she voiced understanding of this.  Patient also discharged with ODT Zofran.  Lab Tests: -I ordered, reviewed, and interpreted labs.   The pertinent results include:   Labs Reviewed  COMPREHENSIVE METABOLIC PANEL - Abnormal; Notable for the following components:      Result Value   Sodium 132 (*)    CO2 21 (*)    Glucose, Bld 127 (*)    All other components within normal limits  CBC - Abnormal; Notable for the following components:   Hemoglobin 15.7 (*)    HCT 46.2 (*)    RDW 17.2 (*)    Platelets 417 (*)    All other components within normal limits  URINALYSIS, ROUTINE W REFLEX MICROSCOPIC - Abnormal; Notable for the following components:   Color, Urine AMBER (*)    APPearance CLOUDY (*)    Specific Gravity, Urine 1.040 (*)    Ketones, ur 5 (*)    Protein, ur 100 (*)    Leukocytes,Ua MODERATE (*)    Bacteria, UA RARE (*)    Squamous Epithelial / LPF >50 (*)    All other components within normal limits  LIPASE, BLOOD  POC URINE PREG, ED     Medicines ordered and prescription drug management: Meds ordered this encounter  Medications   DISCONTD: LORazepam (ATIVAN) injection 1 mg    ondansetron (ZOFRAN) injection 4 mg   lactated ringers bolus 1,000 mL    -I have reviewed the patients home medicines and have made adjustments as needed  Critical interventions none  Cardiac Monitoring: The patient was maintained on a cardiac monitor.  I personally viewed and interpreted the cardiac monitored which showed an underlying rhythm of: NSR  Social  Determinants of Health:  Factors impacting patients care include: alcohol use   Reevaluation: After the interventions noted above, I reevaluated the patient and found that they have :improved  Co morbidities that complicate the patient evaluation  Past Medical History:  Diagnosis Date   Anxiety    Asthma    hosp. 2018 and 2019 no h/o intubation    Depression    GERD (gastroesophageal reflux disease)    Hypertension    Palpitations    Perianal abscess 11/22/2016   12/07/2016.  [facility], [place].  Ovidio Kin, MD.  Perianal abscess is much better but still needs local wound care.  Continue sitz baths 2-3 times perday until all drainage and pain are gone.  Follow up as needed.   Preop general physical exam 01/26/2022   PVC (premature ventricular contraction)    Seasonal allergies    Seizures (HCC)    01/15/22 was last seizure   Tachycardia       Dispostion: I considered admission for this patient, but she does not meet inpatient criteria for admission and is safe for dc with outpatient follow up     Final Clinical Impression(s) / ED Diagnoses Final diagnoses:  None     @PCDICTATION @    , MD 06/29/22 1739

## 2022-06-29 NOTE — ED Notes (Signed)
Pt given approximately 240 mL of water to PO challenge per Dr. Posey Rea. Pt unable to finish drink in one hour due to nausea. MD notified.

## 2022-06-29 NOTE — ED Triage Notes (Signed)
Pt reports that she has been having frequent episodes of emesis since Monday. Unable to keep any PO fluids or food down. Pt denies abd pain.   Pt states she was recently admitted for alcohol related seizures. Pt reports last alcohol intake was yesterday.

## 2022-06-29 NOTE — Discharge Instructions (Addendum)
DO NOT DRINK ALCOHOL on the librium taper!

## 2022-06-30 ENCOUNTER — Encounter: Payer: Self-pay | Admitting: Orthopaedic Surgery

## 2022-07-01 LAB — URINE CULTURE

## 2022-07-02 ENCOUNTER — Encounter (HOSPITAL_COMMUNITY): Payer: Self-pay

## 2022-07-02 ENCOUNTER — Other Ambulatory Visit: Payer: Self-pay

## 2022-07-02 ENCOUNTER — Emergency Department (HOSPITAL_COMMUNITY)
Admission: EM | Admit: 2022-07-02 | Discharge: 2022-07-03 | Disposition: A | Discharge status: Home / Self Care | Payer: Commercial Managed Care - PPO | Attending: Emergency Medicine | Admitting: Emergency Medicine

## 2022-07-02 DIAGNOSIS — J45909 Unspecified asthma, uncomplicated: Secondary | ICD-10-CM | POA: Diagnosis not present

## 2022-07-02 DIAGNOSIS — F1010 Alcohol abuse, uncomplicated: Secondary | ICD-10-CM

## 2022-07-02 DIAGNOSIS — Z20822 Contact with and (suspected) exposure to covid-19: Secondary | ICD-10-CM | POA: Insufficient documentation

## 2022-07-02 DIAGNOSIS — Z79899 Other long term (current) drug therapy: Secondary | ICD-10-CM | POA: Insufficient documentation

## 2022-07-02 DIAGNOSIS — T1491XA Suicide attempt, initial encounter: Secondary | ICD-10-CM | POA: Insufficient documentation

## 2022-07-02 DIAGNOSIS — X58XXXA Exposure to other specified factors, initial encounter: Secondary | ICD-10-CM | POA: Insufficient documentation

## 2022-07-02 DIAGNOSIS — R45851 Suicidal ideations: Secondary | ICD-10-CM

## 2022-07-02 LAB — CBC
HCT: 45.7 % (ref 36.0–46.0)
Hemoglobin: 15.1 g/dL — ABNORMAL HIGH (ref 12.0–15.0)
MCH: 33 pg (ref 26.0–34.0)
MCHC: 33 g/dL (ref 30.0–36.0)
MCV: 99.8 fL (ref 80.0–100.0)
Platelets: 367 10*3/uL (ref 150–400)
RBC: 4.58 MIL/uL (ref 3.87–5.11)
RDW: 17.2 % — ABNORMAL HIGH (ref 11.5–15.5)
WBC: 5.1 10*3/uL (ref 4.0–10.5)
nRBC: 0 % (ref 0.0–0.2)

## 2022-07-02 LAB — RAPID URINE DRUG SCREEN, HOSP PERFORMED
Amphetamines: NOT DETECTED
Barbiturates: NOT DETECTED
Benzodiazepines: POSITIVE — AB
Cocaine: NOT DETECTED
Opiates: NOT DETECTED
Tetrahydrocannabinol: POSITIVE — AB

## 2022-07-02 LAB — COMPREHENSIVE METABOLIC PANEL
ALT: 30 U/L (ref 0–44)
AST: 46 U/L — ABNORMAL HIGH (ref 15–41)
Albumin: 3.9 g/dL (ref 3.5–5.0)
Alkaline Phosphatase: 97 U/L (ref 38–126)
Anion gap: 12 (ref 5–15)
BUN: 8 mg/dL (ref 6–20)
CO2: 25 mmol/L (ref 22–32)
Calcium: 8.8 mg/dL — ABNORMAL LOW (ref 8.9–10.3)
Chloride: 106 mmol/L (ref 98–111)
Creatinine, Ser: 0.63 mg/dL (ref 0.44–1.00)
GFR, Estimated: >60 mL/min (ref >60)
Glucose, Bld: 89 mg/dL (ref 70–99)
Potassium: 3.2 mmol/L — ABNORMAL LOW (ref 3.5–5.1)
Sodium: 143 mmol/L (ref 135–145)
Total Bilirubin: 0.4 mg/dL (ref 0.3–1.2)
Total Protein: 7.7 g/dL (ref 6.5–8.1)

## 2022-07-02 LAB — ETHANOL: Alcohol, Ethyl (B): 264 mg/dL — ABNORMAL HIGH (ref <10)

## 2022-07-02 LAB — PREGNANCY, URINE: Preg Test, Ur: NEGATIVE

## 2022-07-02 MED ORDER — ALBUTEROL SULFATE HFA 108 (90 BASE) MCG/ACT IN AERS
1.0000 | INHALATION_SPRAY | Freq: Four times a day (QID) | RESPIRATORY_TRACT | Status: DC | PRN
  Administered 2022-07-02 – 2022-07-03 (×2): 2 via RESPIRATORY_TRACT
  Filled 2022-07-02: qty 6.7

## 2022-07-02 NOTE — ED Triage Notes (Signed)
Brought in by [employer].  Ex-husband notified [employer] stating that she was going to cut her wrists. Superficial cut to left wrist.  Brought in emergency commitment, Was supposed to go to a treatment center for alcohol in [place] tomorrow. Has had 3-4 shots of vodka.  Denies SI/HI. Tetanus appx 6-8 years ago.

## 2022-07-02 NOTE — ED Notes (Signed)
Pt has finished talking to mental health.

## 2022-07-02 NOTE — ED Provider Notes (Signed)
[facility] EMERGENCY DEPARTMENT Provider Note   CSN: [id number] Arrival date & time: 07/02/22  1950     History  Chief Complaint  Patient presents with   Medical Clearance    Kelsey [name] is a 41 y.o. female.  Patient with history of polysubstance abuse, MDD, GAD, suicide attempts, asthma, and alcohol abuse presents today with complaints of SI. Brought in by police, states that earlier today she attempted suicide by cutting her wrists. Denies any current SI but does endorse that this was a suicide attempt earlier today. Pt denies current homicidal ideation or history of violence. Pt denies any history of auditory or visual hallucinations. She denies use of substances other than alcohol and marijuana. States that she drinks 3-4 shots of vodka daily and has been accepted to [facility] for detox and is scheduled to go there tomorrow. Denies any somatic complaints. Last drink was right before she got here today.  The history is provided by the patient. No language interpreter was used.       Home Medications Prior to Admission medications   Medication Sig Start Date End Date Taking? Authorizing Provider  albuterol (VENTOLIN HFA) 108 (90 Base) MCG/ACT inhaler Inhale 1-2 puffs into the lungs every 6 (six) hours as needed for wheezing or shortness of breath.    [provider]  chlordiazePOXIDE (LIBRIUM) 25 MG capsule 50mg  PO TID x 1D, then 25-50mg  PO BID X 1D, then 25-50mg  PO QD X 1D 06/29/22   Kommor, Madison, MD  FLUoxetine (PROZAC) 20 MG capsule Take 80 mg by mouth daily.    [provider]  folic acid (FOLVITE) 1 MG tablet Take 1 tablet (1 mg total) by mouth daily. 06/26/22   06/28/22, DO  gabapentin (NEURONTIN) 400 MG capsule Take 2 capsules (800 mg total) by mouth 3 (three) times daily. 06/23/22   Massengill, 06/25/22, MD  levETIRAcetam (KEPPRA) 500 MG tablet Take 1 tablet (500 mg total) by mouth 2 (two) times daily. 01/20/22   01/22/22, MD   levonorgestrel (MIRENA) 20 MCG/24HR IUD 1 Intra Uterine Device (1 each total) by Intrauterine route once. 07/16/15   07/18/15, NP  Multiple Vitamin (MULTIVITAMIN WITH MINERALS) TABS tablet Take 1 tablet by mouth daily. 06/26/22   06/28/22, DO  ondansetron (ZOFRAN-ODT) 4 MG disintegrating tablet Take 1 tablet (4 mg total) by mouth every 8 (eight) hours as needed for nausea or vomiting. 06/29/22   Kommor, Madison, MD  thiamine (VITAMIN B1) 100 MG tablet Take 1 tablet (100 mg total) by mouth daily. 06/26/22   06/28/22, DO  traZODone (DESYREL) 50 MG tablet Take 1 tablet (50 mg total) by mouth at bedtime. 06/26/22   06/28/22, DO      Allergies    Penicillins    Review of Systems   Review of Systems  Psychiatric/Behavioral:  Positive for self-injury and suicidal ideas.   All other systems reviewed and are negative.   Physical Exam Updated Vital Signs BP (!) 126/106 (BP Location: Right Arm)   Pulse (!) 109   Temp 97.6 F (36.4 C) (Oral)   Resp 18   Ht 5\' 4"  (1.626 m)   Wt 64.4 kg   SpO2 93%   BMI 24.37 kg/m  Physical Exam Vitals and nursing note reviewed.  Constitutional:      General: She is not in acute distress.    Appearance: Normal appearance. She is normal weight. She is not ill-appearing, toxic-appearing or  diaphoretic.  HENT:     Head: Normocephalic and atraumatic.  Cardiovascular:     Rate and Rhythm: Normal rate.  Pulmonary:     Effort: Pulmonary effort is normal. No respiratory distress.  Musculoskeletal:        General: Normal range of motion.     Cervical back: Normal range of motion.  Skin:    General: Skin is warm and dry.     Comments: 1 small superficial vertical 3 cm laceration noted to the left wrist. No active bleeding. Radial pulses intact and 2+. Capillary refill less than 2 seconds. Distal sensation intact.  Neurological:     General: No focal deficit present.     Mental Status: She is alert.  Psychiatric:        Mood and  Affect: Mood normal.        Behavior: Behavior normal.     ED Results / Procedures / Treatments   Labs (all labs ordered are listed, but only abnormal results are displayed) Labs Reviewed  COMPREHENSIVE METABOLIC PANEL - Abnormal; Notable for the following components:      Result Value   Potassium 3.2 (*)    Calcium 8.8 (*)    AST 46 (*)    All other components within normal limits  ETHANOL - Abnormal; Notable for the following components:   Alcohol, Ethyl (B) 264 (*)    All other components within normal limits  CBC - Abnormal; Notable for the following components:   Hemoglobin 15.1 (*)    RDW 17.2 (*)    All other components within normal limits  RAPID URINE DRUG SCREEN, HOSP PERFORMED - Abnormal; Notable for the following components:   Benzodiazepines POSITIVE (*)    Tetrahydrocannabinol POSITIVE (*)    All other components within normal limits  PREGNANCY, URINE    EKG None  Radiology No results found.  Procedures Procedures    Medications Ordered in ED Medications  albuterol (VENTOLIN HFA) 108 (90 Base) MCG/ACT inhaler 1-2 puff (2 puffs Inhalation Given 07/02/22 2241)    ED Course/ Medical Decision Making/ A&P                           Medical Decision Making Amount and/or Complexity of Data Reviewed Labs: ordered.  Risk Prescription drug management.   This patient presents to the ED for concern of SI, this involves an extensive number of treatment options, and is a complaint that carries with it a high risk of complications and morbidity.    Co morbidities that complicate the patient evaluation  Hx alcohol abuse and suicide attempts in the past   Additional history obtained:  Additional history obtained from epic chart review   Lab Tests:  I Ordered, and personally interpreted labs.  The pertinent results include:  UDS + benzos and THC. K 3.2. Ethanol 264.   Problem List / ED Course / Critical interventions / Medication management   I  ordered medication including oral potassium for hypokalemia and albuterol  for asthma at the patients request  Reevaluation of the patient after these medicines showed that the patient resolved I have reviewed the patients home medicines and have made adjustments as needed   Test / Admission - Considered:  Patient presents today after she cut her wrist in a suicide attempt. Hx significant for alcohol abuse, states her last drink was PTA. Not currently withdrawing at this time. She did have some mild wheezing on initial exam which  resolved with inhaler.  The cut on her wrist is extremely superficial in nature and has been cleaned and given a Band-Aid, no further intervention is indicated for this at this time.  Medically cleared for TTS consult.  TTS has seen the patient and plan to observe overnight and go to her treatment center in the morning.  Patient is currently voluntary and is resting comfortably at the nurses station in no distress.  I have informed the patient of this plan and she states that she is amenable with staying here overnight.  Given that she did have the suicide attempt earlier today, if she tries to leave she will need to be IVCed. Paperwork filled out for same, however will not submit it unless she tries to leave. Oncoming provider Dr. Posey Rea is aware of this plan.  Findings and plan of care discussed with supervising physician Dr. Fredderick Phenix who is in agreement.    Final Clinical Impression(s) / ED Diagnoses Final diagnoses:  Alcohol abuse  Suicide attempt [facility])    Rx / DC Orders ED Discharge Orders     None         Vear Clock 07/02/22 2347    Rolan Bucco, MD 07/03/22 0700
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
## 2022-07-02 NOTE — ED Notes (Signed)
Kelsey Bering, NP recommends Pt be observed in the ED overnight and then transferred directly to [facility] tomorrow. According to Pt, she has a bed there tomorrow.

## 2022-07-03 LAB — RESP PANEL BY RT-PCR (FLU A&B, COVID) ARPGX2
Influenza A by PCR: NEGATIVE
Influenza B by PCR: NEGATIVE
SARS Coronavirus 2 by RT PCR: NEGATIVE

## 2022-07-03 MED ORDER — ADULT MULTIVITAMIN W/MINERALS CH: 1.0000 | ORAL_TABLET | Freq: Every day | ORAL | Status: DC

## 2022-07-03 MED ORDER — LORAZEPAM 2 MG/ML IJ SOLN: 1.0000 mg | INTRAMUSCULAR | Status: DC | PRN

## 2022-07-03 MED ORDER — THIAMINE HCL 100 MG/ML IJ SOLN: 100.0000 mg | Freq: Every day | INTRAMUSCULAR | Status: DC

## 2022-07-03 MED ORDER — LORAZEPAM 1 MG PO TABS
1.0000 mg | ORAL_TABLET | ORAL | Status: DC | PRN
  Administered 2022-07-03 (×2): 1 mg via ORAL
  Filled 2022-07-03 (×2): qty 1

## 2022-07-03 MED ORDER — ONDANSETRON 4 MG PO TBDP
4.0000 mg | ORAL_TABLET | Freq: Once | ORAL | Status: AC
Start: 2022-07-03 — End: 2022-07-03
  Administered 2022-07-03: 4 mg via ORAL
  Filled 2022-07-03: qty 1

## 2022-07-03 MED ORDER — THIAMINE HCL 100 MG PO TABS
100.0000 mg | ORAL_TABLET | Freq: Every day | ORAL | Status: DC
Start: 2022-07-03 — End: 2022-07-03
  Administered 2022-07-03: 100 mg via ORAL
  Filled 2022-07-03: qty 1

## 2022-07-03 MED ORDER — FOLIC ACID 1 MG PO TABS
1.0000 mg | ORAL_TABLET | Freq: Every day | ORAL | Status: DC
Start: 2022-07-03 — End: 2022-07-03
  Administered 2022-07-03: 1 mg via ORAL
  Filled 2022-07-03: qty 1

## 2022-07-03 NOTE — ED Notes (Signed)
Called and spoke with Insurance risk surveyor at [facility] treatment center who verified pt does has a bed today and transport will be here around 1pm to pick pt up  Pt asking if she could stop by house to get her belongings and Danna Hefty did verify driver will stop at pt's house so she may get her belongings; Pt informed that if she decides to not go with driver pt will be ineligible to ever go to facility for treatment; pt verbalizes this understanding

## 2022-07-03 NOTE — ED Provider Notes (Signed)
Emergency Medicine Observation Re-evaluation Note  Kelsey [name] is a 41 y.o. female, seen on rounds today.  Pt initially presented to the ED for complaints of Medical Clearance Currently, the patient is calm resting.  Physical Exam  BP (!) 118/97 (BP Location: Right Arm)   Pulse 62   Temp 98.4 F (36.9 C) (Oral)   Resp 20   Ht 5\' 4"  (1.626 m)   Wt 64.4 kg   SpO2 100%   BMI 24.37 kg/m  Physical Exam General: NAD Lungs: on ambient air, no distress Psych: calm cooperative  ED Course / MDM  EKG:   I have reviewed the labs performed to date as well as medications administered while in observation.  Recent changes in the last 24 hours include placement to rehab facility.  Plan  Current plan is for placement, voluntary to facility in [place], bed available this afternoon. Patient is NOT SUICIDAL, she has no plan for self harm or for the harm of others, she is excited to go to rehab and is very optimistic that this will be a beneficial experience. She has mild nausea, CIWA in place; given ativan/zofran. She is medically cleared.    is not under involuntary commitment.     [name], DO 07/03/22 1024

## 2022-07-03 NOTE — ED Notes (Signed)
Pt was given her clothes and changed; pt was taken out via wheelchair. [place] treatment center transport was here to take pt home to get her belongings and then pt was going to facility

## 2022-07-03 NOTE — ED Notes (Signed)
Pt received breakfast tray 

## 2022-07-03 NOTE — ED Notes (Signed)
Pt stating she thinks she is going through withdrawals; EDP made aware and CIWA assessment performed

## 2022-07-03 NOTE — ED Notes (Signed)
Pt's husband called requesting information on pt; pt asked if she would like to speak to husband, stated yes; pt spoke to spouse and after hanging up pt tearful; pt given tissues and asked if she needed anything, pt replied not at this time

## 2022-07-03 NOTE — Discharge Instructions (Addendum)
It was a pleasure caring for you today in the emergency department. ° °Please return to the emergency department for any worsening or worrisome symptoms. ° ° °

## 2022-07-03 NOTE — ED Notes (Signed)
Pt received lunch tray 

## 2022-07-03 NOTE — ED Notes (Signed)
TOC consulted for substance use resources. CSW noted per chart review that pt will be discharging today at 1pm to go to [facility] treatment facility. CSW also notes pt was provided with substance use resources on 7/31. TOC to clear consult at this time.

## 2022-08-02 IMAGING — DX DG PORTABLE PELVIS
1 series · 1 of 1 positions shown · non-contrast
Comparison: None Available.

CLINICAL DATA: Status post right hip replacement

EXAM:
PORTABLE PELVIS 1-2 VIEWS

[pelvis ap]
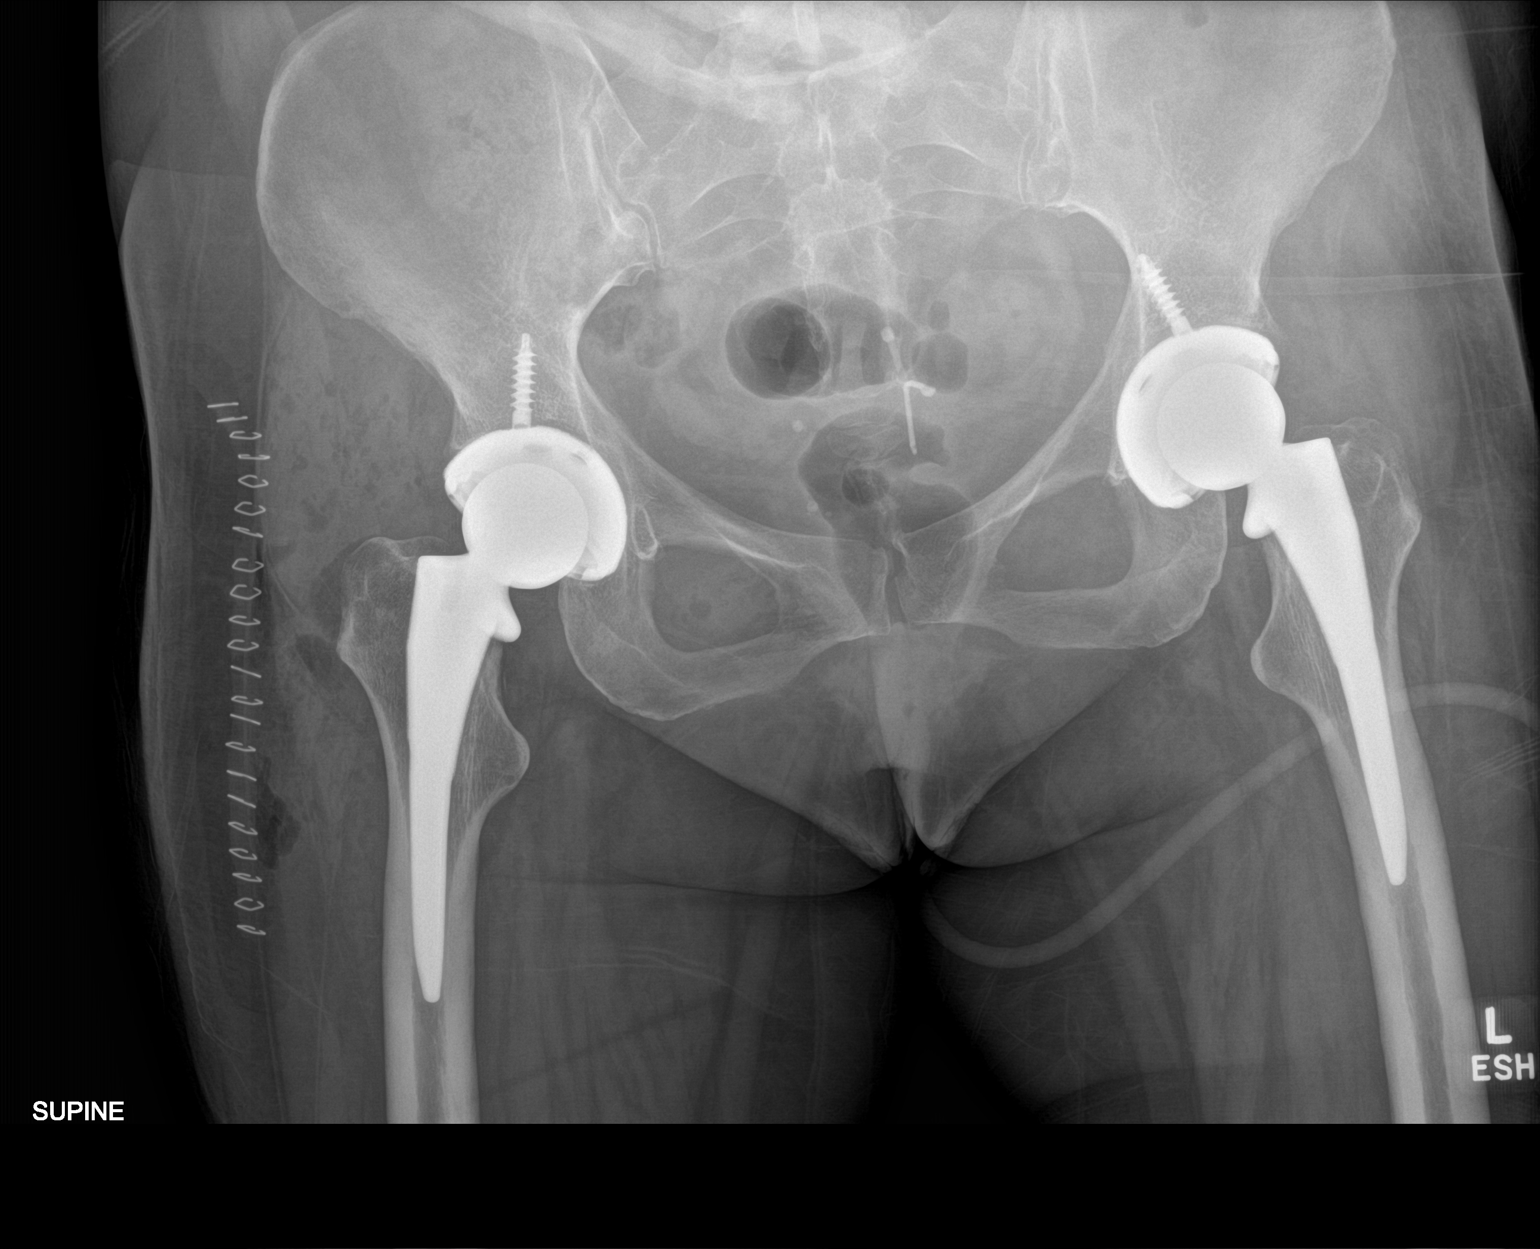

[1 of 1 positions shown; findings below may reference images not displayed]

FINDINGS: Status post right hip total arthroplasty with expected overlying
postoperative change. No evidence of perihardware fracture or
component malpositioning. Nonobstructive pattern of overlying bowel
gas. IUD projects in the pelvis.
IMPRESSION: Status post right hip total arthroplasty with expected overlying
postoperative change. No evidence of perihardware fracture or
component malpositioning.

## 2022-08-28 ENCOUNTER — Ambulatory Visit (INDEPENDENT_AMBULATORY_CARE_PROVIDER_SITE_OTHER): Payer: Commercial Managed Care - PPO | Admitting: Internal Medicine

## 2022-08-28 ENCOUNTER — Encounter: Payer: Self-pay | Admitting: Internal Medicine

## 2022-08-28 VITALS — BP 132/86 | HR 85 | Resp 18 | Ht 64.0 in | Wt 124.8 lb

## 2022-08-28 DIAGNOSIS — I10 Essential (primary) hypertension: Secondary | ICD-10-CM | POA: Diagnosis not present

## 2022-08-28 DIAGNOSIS — F1021 Alcohol dependence, in remission: Secondary | ICD-10-CM

## 2022-08-28 DIAGNOSIS — Z419 Encounter for procedure for purposes other than remedying health state, unspecified: Secondary | ICD-10-CM

## 2022-08-28 DIAGNOSIS — F411 Generalized anxiety disorder: Secondary | ICD-10-CM

## 2022-08-28 DIAGNOSIS — J4520 Mild intermittent asthma, uncomplicated: Secondary | ICD-10-CM

## 2022-08-28 DIAGNOSIS — F1121 Opioid dependence, in remission: Secondary | ICD-10-CM

## 2022-08-28 DIAGNOSIS — G40909 Epilepsy, unspecified, not intractable, without status epilepticus: Secondary | ICD-10-CM | POA: Diagnosis not present

## 2022-08-28 DIAGNOSIS — F331 Major depressive disorder, recurrent, moderate: Secondary | ICD-10-CM | POA: Diagnosis not present

## 2022-08-28 LAB — POCT URINE PREGNANCY: Preg Test, Ur: NEGATIVE

## 2022-08-28 MED ORDER — ALBUTEROL SULFATE HFA 108 (90 BASE) MCG/ACT IN AERS: 1.0000 | INHALATION_SPRAY | Freq: Four times a day (QID) | RESPIRATORY_TRACT | 3 refills | Status: DC | PRN

## 2022-08-28 MED ORDER — FLUOXETINE HCL 40 MG PO CAPS: 80.0000 mg | ORAL_CAPSULE | Freq: Every day | ORAL | 1 refills | Status: DC

## 2022-08-28 MED ORDER — LEVETIRACETAM 500 MG PO TABS: 500.0000 mg | ORAL_TABLET | Freq: Two times a day (BID) | ORAL | 3 refills | Status: DC

## 2022-08-28 NOTE — Progress Notes (Signed)
Established Patient Office Visit  Subjective:  Patient ID: Kelsey [name], female    DOB: January 05, 1981  Age: 41 y.o. MRN: [id number]  CC:  Chief Complaint  Patient presents with   Follow-up    Follow up pt needs albuterol inhaler prozac which was up to 90 mg and needs to know where to get vivotrol shot ? Also would like pregnancy test has murena but feels funny    HPI Kelsey [name] is a 42 y.o. female with past medical history of HTN, asthma, GERD, seizure disorder, depression with anxiety, chronic back and hip pain and tobacco abuse who presents for f/u of her chronic medical conditions.  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  She was placed on oral opioid medications after the surgery.  She had suicidal attempt and was dependent on opioids and alcohol after her surgery.  She also has had episodes of seizures due to alcohol withdrawal.  She was admitted at [facility] and was later placed in rehab facility for opioid and alcohol dependence.  She reports that she has quit alcohol now.  Her last alcohol intake was about 2 months ago.  She was placed on Vivitrol, but has not been able to see any addiction clinic since being discharged from rehab facility.  Denies any recent episode of seizures.  Of note, she states that her Keppra was discontinued (?) from rehab facility.  She is currently on Prozac 60 mg daily, but still reports anxiety, agitation and anhedonia.  Denies any SI or HI currently.  Past Medical History:  Diagnosis Date   Anxiety    Asthma    hosp. 2018 and 2019 no h/o intubation    Depression    GERD (gastroesophageal reflux disease)    Hypertension    Palpitations    Perianal abscess 11/22/2016   12/07/2016.  [facility], [place].  Kelsey Overall, MD.  Perianal abscess is much better but still needs local wound care.  Continue sitz baths 2-3 times perday until all drainage and pain are gone.  Follow up as needed.   Preop  general physical exam 01/26/2022   PVC (premature ventricular contraction)    Seasonal allergies    Seizures ([place])    01/15/22 was last seizure   Tachycardia     Past Surgical History:  Procedure Laterality Date   CESAREAN SECTION  10/15/2009, 02/08/2006   INCISION AND DRAINAGE PERIRECTAL ABSCESS Bilateral 11/22/2016   Procedure: IRRIGATION AND DEBRIDEMENT PERIRECTAL ABSCESS;  Surgeon: Kelsey Overall, MD;  Location: [facility];  Service: General;  Laterality: Bilateral;   INTRAUTERINE DEVICE INSERTION     Mirena 12/24/19 exp 12/28/21 Kelsey [name] [facility]   TOTAL HIP ARTHROPLASTY Left 02/17/2022   Procedure: Left TOTAL HIP ARTHROPLASTY ANTERIOR APPROACH;  Surgeon: Kelsey Rossetti, MD;  Location: [facility];  Service: Orthopedics;  Laterality: Left;   TOTAL HIP ARTHROPLASTY Right 05/05/2022   Procedure: RIGHT TOTAL HIP ARTHROPLASTY ANTERIOR APPROACH;  Surgeon: Kelsey Rossetti, MD;  Location: [facility];  Service: Orthopedics;  Laterality: Right;    Family History  Problem Relation Age of Onset   Rheumatologic disease Mother    Arthritis Mother    Asthma Mother    Depression Mother    Heart disease Father    Asthma Maternal Grandmother    Hypertension Maternal Grandmother    Heart disease Maternal Grandfather    Lung cancer Paternal Grandmother    Healthy Daughter  Healthy Son     Social History   Socioeconomic History   Marital status: Married    Spouse name: Not on file   Number of children: 2   Years of education: Not on file   Highest education level: Not on file  Occupational History   Occupation: respiratory therapists    Employer: [employer]  Tobacco Use   Smoking status: Every Day    Packs/day: 0.25    Years: 10.00    Total pack years: 2.50    Types: Cigarettes   Smokeless tobacco: Never  Vaping Use   Vaping Use: Never used  Substance and Sexual Activity   Alcohol use: Yes    Alcohol/week: 4.0 standard drinks of alcohol    Types: 4 Shots  of liquor per week   Drug use: Not Currently    Types: Marijuana   Sexual activity: Not on file  Other Topics Concern   Not on file  Social History Narrative   Respiratory Therapist, 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 1 each 0   albuterol (VENTOLIN HFA) 108 (90 Base) MCG/ACT inhaler Inhale 1-2 puffs into the lungs every 6 (six) hours as needed for wheezing or shortness of breath.     FLUoxetine (PROZAC) 20 MG capsule Take 80 mg by mouth daily.     chlordiazePOXIDE (LIBRIUM) 25 MG capsule 50mg PO TID x 1D, then 25-50mg PO BID X 1D, then 25-50mg PO QD X 1D (Patient not taking: Reported on 08/28/2022) 10 capsule 0   folic acid (FOLVITE) 1 MG tablet Take 1 tablet (1 mg total) by mouth daily. (Patient not taking: Reported on 08/28/2022) 30 tablet 0   gabapentin (NEURONTIN) 400 MG capsule Take 2 capsules (800 mg total) by mouth 3 (three) times daily. (Patient not taking: Reported on 08/28/2022)     levETIRAcetam (KEPPRA) 500 MG tablet Take 1 tablet (500 mg total) by mouth 2 (two) times daily. (Patient not taking: Reported on 08/28/2022) 60 tablet 1   Multiple Vitamin (MULTIVITAMIN WITH MINERALS) TABS tablet Take 1 tablet by mouth daily. (Patient not taking: Reported on 08/28/2022)     ondansetron (ZOFRAN-ODT) 4 MG disintegrating tablet Take 1 tablet (4 mg total) by mouth every 8 (eight) hours as needed for nausea or vomiting.  (Patient not taking: Reported on 08/28/2022) 20 tablet 0   thiamine (VITAMIN B1) 100 MG tablet Take 1 tablet (100 mg total) by mouth daily. (Patient not taking: Reported on 08/28/2022) 30 tablet 0   traZODone (DESYREL) 50 MG tablet Take 1 tablet (50 mg total) by mouth at bedtime. (Patient not taking: Reported on 08/28/2022) 30 tablet 0   No facility-administered medications prior to visit.    Allergies  Allergen Reactions   Penicillins Rash    Has patient had a PCN reaction causing immediate rash, facial/tongue/throat swelling, SOB or lightheadedness with   hypotension: No Has patient had a PCN reaction causing severe rash involving mucus membranes or skin necrosis: No Has patient had a PCN reaction that required hospitalization No Has patient had a PCN reaction occurring within the last 10 years: No If all of the above answers are "NO", then may proceed with Cephalosporin use.    ROS Review of Systems  Constitutional:  Positive for fatigue. Negative for chills and fever.  HENT:  Negative for congestion, sinus pressure, sinus pain and sore throat.   Eyes:  Negative for pain and discharge.  Respiratory:  Negative for cough, shortness of breath and wheezing.   Cardiovascular:  Negative for chest pain and palpitations.  Gastrointestinal:  Negative for abdominal pain, diarrhea, nausea and vomiting.  Endocrine: Negative for polydipsia and polyuria.  Genitourinary:  Negative for dysuria and hematuria.  Musculoskeletal:  Positive for arthralgias and back pain. Negative for neck pain and neck stiffness.  Skin:  Negative for rash.  Neurological:  Positive for tremors and seizures. Negative for dizziness and weakness.  Psychiatric/Behavioral:  Positive for agitation and sleep disturbance. Negative for behavioral problems. The patient is nervous/anxious.       Objective:    Physical Exam Vitals reviewed.  Constitutional:      General: She is not in acute distress.    Appearance: She is not  diaphoretic.  HENT:     Head: Normocephalic and atraumatic.     Nose: Nose normal.     Mouth/Throat:     Mouth: Mucous membranes are moist.  Eyes:     General: No scleral icterus.    Extraocular Movements: Extraocular movements intact.  Cardiovascular:     Rate and Rhythm: Normal rate and regular rhythm.     Pulses: Normal pulses.     Heart sounds: Normal heart sounds. No murmur heard. Pulmonary:     Breath sounds: Normal breath sounds. No wheezing or rales.  Musculoskeletal:     Cervical back: Neck supple. No tenderness.     Right lower leg: No edema.     Left lower leg: No edema.  Skin:    General: Skin is warm.     Findings: No rash.  Neurological:     General: No focal deficit present.     Mental Status: She is alert and oriented to person, place, and time.     Sensory: No sensory deficit.     Motor: Tremor (B/l hands) present. No weakness.  Psychiatric:        Mood and Affect: Mood normal.        Behavior: Behavior normal.     BP 132/86 (BP Location: Right Arm, Patient Position: Sitting, Cuff Size: Normal)   Pulse 85   Resp 18   Ht 5' 4" (1.626 m)   Wt 124 lb 12.8 oz (56.6 kg)   SpO2 98%   BMI 21.42 kg/m  Wt Readings from Last 3 Encounters:  08/28/22 124 lb 12.8 oz (56.6 kg)  07/02/22 142 lb (64.4 kg)  06/26/22 142 lb 3.2 oz (64.5 kg)    Lab Results  Component Value Date   TSH 3.374 06/20/2022   Lab Results  Component Value Date   WBC 6.5 08/28/2022   HGB 13.9 08/28/2022   HCT 43.4 08/28/2022   MCV 98 (H) 08/28/2022   PLT 347 08/28/2022   Lab Results  Component Value Date   NA 138 08/28/2022   K 4.1 08/28/2022   CO2 21 08/28/2022   GLUCOSE 94 08/28/2022   BUN  8 08/28/2022   CREATININE 0.56 (L) 08/28/2022   BILITOT 0.6 08/28/2022   ALKPHOS 72 08/28/2022   AST 16 08/28/2022   ALT 13 08/28/2022   PROT 6.5 08/28/2022   ALBUMIN 4.0 08/28/2022   CALCIUM 8.8 08/28/2022   ANIONGAP 12 07/02/2022   EGFR 118 08/28/2022   GFR 97.90 01/30/2020    Lab Results  Component Value Date   CHOL 145 06/20/2022   Lab Results  Component Value Date   HDL 33 (L) 06/20/2022   Lab Results  Component Value Date   LDLCALC 87 06/20/2022   Lab Results  Component Value Date   TRIG 127 06/20/2022   Lab Results  Component Value Date   CHOLHDL 4.4 06/20/2022   Lab Results  Component Value Date   HGBA1C 5.0 01/19/2022      Assessment & Plan:   Problem List Items Addressed This Visit       Cardiovascular and Mediastinum   Essential hypertension    Now well controlled without any antihypertensive Used to be on controlled even with multiple antihypertensives Was likely elevated in the past due to chronic hip pain      Relevant Orders   CMP14+EGFR (Completed)   CBC with Differential/Platelet (Completed)     Respiratory   Asthma    Well controlled with albuterol inhaler, refilled      Relevant Medications   albuterol (VENTOLIN HFA) 108 (90 Base) MCG/ACT inhaler   Other Relevant Orders   CBC with Differential/Platelet (Completed)     Nervous and Auditory   Seizure disorder (HCC)    Needs to take Keppra regularly Advised to avoid alcohol use Although her episodes of seizures were likely due to alcohol withdrawal, would prefer neurology evaluation before discontinuing Keppra      Relevant Medications   levETIRAcetam (KEPPRA) 500 MG tablet   Other Relevant Orders   Ambulatory referral to Neurology   CMP14+EGFR (Completed)   CBC with Differential/Platelet (Completed)     Other   GAD (generalized anxiety disorder)    Uncontrolled with Prozac 60 mg daily Increased dose of Prozac to 80 mg daily Was on Xanax in the past, but had BZD dependence Used to see Psychiatry - Kelsey Kaur      Relevant Medications   FLUoxetine (PROZAC) 40 MG capsule   Depression, major, recurrent, moderate (HCC) - Primary   Relevant Medications   FLUoxetine (PROZAC) 40 MG capsule   Other Relevant Orders   CMP14+EGFR (Completed)   CBC with  Differential/Platelet (Completed)   Alcohol dependence in remission (HCC)    Was in rehab facility for alcohol dependence Has had delirium tremens Had Vivitrol injection Referred to addiction clinic for continuing Vivitrol      Relevant Orders   Ambulatory referral to Chemical Dependency   Opioid dependence in remission (HCC)    Was in rehab facility for alcohol, opioid and BZD dependence Has been abstinent from opioids and BZD since being discharged Referred to addiction clinic      Relevant Orders   Ambulatory referral to Chemical Dependency   Other Visit Diagnoses     Patient-requested procedure       Relevant Orders   POCT urine pregnancy (Completed)       Meds ordered this encounter  Medications   FLUoxetine (PROZAC) 40 MG capsule    Sig: Take 2 capsules (80 mg total) by mouth daily.    Dispense:  180 capsule    Refill:  1    Dose change     levETIRAcetam (KEPPRA) 500 MG tablet    Sig: Take 1 tablet (500 mg total) by mouth 2 (two) times daily.    Dispense:  60 tablet    Refill:  3   albuterol (VENTOLIN HFA) 108 (90 Base) MCG/ACT inhaler    Sig: Inhale 1-2 puffs into the lungs every 6 (six) hours as needed for wheezing or shortness of breath.    Dispense:  18 g    Refill:  3    Follow-up: Return in about 3 months (around 11/28/2022).     K , MD 

## 2022-08-28 NOTE — Patient Instructions (Addendum)
Please start taking Keppra as prescribed.  Please take Prozac 80 mg instead of 60 mg.  Please continue to follow low salt diet and perform moderate exercise/walking at least 150 mins/week.  Please contact the center for addiction medicine: [facility] [address] [place], [place] [address] [phone]

## 2022-08-29 LAB — CBC WITH DIFFERENTIAL/PLATELET
Basophils Absolute: 0 10*3/uL (ref 0.0–0.2)
Basos: 1 %
EOS (ABSOLUTE): 0.4 10*3/uL (ref 0.0–0.4)
Eos: 6 %
Hematocrit: 43.4 % (ref 34.0–46.6)
Hemoglobin: 13.9 g/dL (ref 11.1–15.9)
Immature Grans (Abs): 0 10*3/uL (ref 0.0–0.1)
Immature Granulocytes: 0 %
Lymphocytes Absolute: 1.3 10*3/uL (ref 0.7–3.1)
Lymphs: 21 %
MCH: 31.4 pg (ref 26.6–33.0)
MCHC: 32 g/dL (ref 31.5–35.7)
MCV: 98 fL — ABNORMAL HIGH (ref 79–97)
Monocytes Absolute: 0.5 10*3/uL (ref 0.1–0.9)
Monocytes: 7 %
Neutrophils Absolute: 4.3 10*3/uL (ref 1.4–7.0)
Neutrophils: 65 %
Platelets: 347 10*3/uL (ref 150–450)
RBC: 4.42 x10E6/uL (ref 3.77–5.28)
RDW: 13 % (ref 11.7–15.4)
WBC: 6.5 10*3/uL (ref 3.4–10.8)

## 2022-08-29 LAB — CMP14+EGFR
ALT: 13 IU/L (ref 0–32)
AST: 16 IU/L (ref 0–40)
Albumin/Globulin Ratio: 1.6 (ref 1.2–2.2)
Albumin: 4 g/dL (ref 3.9–4.9)
Alkaline Phosphatase: 72 IU/L (ref 44–121)
BUN/Creatinine Ratio: 14 (ref 9–23)
BUN: 8 mg/dL (ref 6–24)
Bilirubin Total: 0.6 mg/dL (ref 0.0–1.2)
CO2: 21 mmol/L (ref 20–29)
Calcium: 8.8 mg/dL (ref 8.7–10.2)
Chloride: 102 mmol/L (ref 96–106)
Creatinine, Ser: 0.56 mg/dL — ABNORMAL LOW (ref 0.57–1.00)
Globulin, Total: 2.5 g/dL (ref 1.5–4.5)
Glucose: 94 mg/dL (ref 70–99)
Potassium: 4.1 mmol/L (ref 3.5–5.2)
Sodium: 138 mmol/L (ref 134–144)
Total Protein: 6.5 g/dL (ref 6.0–8.5)
eGFR: 118 mL/min/{1.73_m2} (ref >59)

## 2022-09-02 DIAGNOSIS — F1021 Alcohol dependence, in remission: Secondary | ICD-10-CM | POA: Insufficient documentation

## 2022-09-02 DIAGNOSIS — F1121 Opioid dependence, in remission: Secondary | ICD-10-CM | POA: Insufficient documentation

## 2022-09-02 NOTE — Assessment & Plan Note (Signed)
Now well controlled without any antihypertensive Used to be on controlled even with multiple antihypertensives Was likely elevated in the past due to chronic hip pain 

## 2022-09-02 NOTE — Assessment & Plan Note (Signed)
Needs to take Keppra regularly Advised to avoid alcohol use Although her episodes of seizures were likely due to alcohol withdrawal, would prefer neurology evaluation before discontinuing Keppra 

## 2022-09-02 NOTE — Assessment & Plan Note (Signed)
Well controlled with albuterol inhaler, refilled

## 2022-09-02 NOTE — Assessment & Plan Note (Signed)
Was in rehab facility for alcohol, opioid and BZD dependence Has been abstinent from opioids and BZD since being discharged Referred to addiction clinic

## 2022-09-02 NOTE — Assessment & Plan Note (Signed)
Was in rehab facility for alcohol dependence Has had delirium tremens Had Vivitrol injection Referred to addiction clinic for continuing Vivitrol

## 2022-09-02 NOTE — Assessment & Plan Note (Signed)
Overall well controlled, but feels anxious Increased dose of Prozac to 80 mg daily

## 2022-09-02 NOTE — Assessment & Plan Note (Signed)
Uncontrolled with Prozac 60 mg daily Increased dose of Prozac to 80 mg daily Was on Xanax in the past, but had BZD dependence Used to see Psychiatry - Dr Toy Care

## 2022-09-05 ENCOUNTER — Encounter: Payer: Self-pay | Admitting: Neurology

## 2022-10-04 ENCOUNTER — Encounter: Payer: Self-pay | Admitting: Neurology

## 2022-10-04 ENCOUNTER — Ambulatory Visit (INDEPENDENT_AMBULATORY_CARE_PROVIDER_SITE_OTHER): Payer: Commercial Managed Care - PPO | Admitting: Neurology

## 2022-10-04 VITALS — BP 148/106 | HR 87 | Ht 64.0 in | Wt 124.2 lb

## 2022-10-04 DIAGNOSIS — R569 Unspecified convulsions: Secondary | ICD-10-CM

## 2022-10-04 DIAGNOSIS — G40909 Epilepsy, unspecified, not intractable, without status epilepticus: Secondary | ICD-10-CM

## 2022-10-04 MED ORDER — LEVETIRACETAM 500 MG PO TABS: 500.0000 mg | ORAL_TABLET | Freq: Two times a day (BID) | ORAL | 11 refills | Status: DC
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
## 2022-10-04 NOTE — Patient Instructions (Signed)
Good to meet you.  Schedule open MRI brain with and without contrast  2. Schedule EEG  3. Continue Levetiracetam (Keppra) 500mg  twice a day  4. Continue to monitor BP with PCP  5. Follow-up in 3 months, call for any changes   Seizure Precautions: 1. If medication has been prescribed for you to prevent seizures, take it exactly as directed.  Do not stop taking the medicine without talking to your doctor first, even if you have not had a seizure in a long time.   2. Avoid activities in which a seizure would cause danger to yourself or to others.  Don't operate dangerous machinery, swim alone, or climb in high or dangerous places, such as on ladders, roofs, or girders.  Do not drive unless your doctor says you may.  3. If you have any warning that you may have a seizure, lay down in a safe place where you can't hurt yourself.    4.  No driving for 6 months from last seizure, as per [facility].   Please refer to the following link on the [employer]ile[employer] of [employer]'s website for more information: http://www.epilepsyfoundation.org/answerplace/Social/driving/drivingu.cfm   5.  Maintain good sleep hygiene. Continue with avoiding alcohol and any other substances that can lower seizure threshold  6.  Notify your neurology if you are planning pregnancy or if you become pregnant.  7.  Contact your doctor if you have any problems that may be related to the medicine you are taking.  8.  Call 911 and bring the patient back to the ED if:        A.  The seizure lasts longer than 5 minutes.       B.  The patient doesn't awaken shortly after the seizure  C.  The patient has new problems such as difficulty seeing, speaking or moving  D.  The patient was injured during the seizure  E.  The patient has a temperature over 102 F (39C)  F.  The patient vomited and now is having trouble breathing

## 2022-10-27 ENCOUNTER — Ambulatory Visit (INDEPENDENT_AMBULATORY_CARE_PROVIDER_SITE_OTHER): Payer: Commercial Managed Care - PPO | Admitting: Neurology

## 2022-10-27 DIAGNOSIS — G40909 Epilepsy, unspecified, not intractable, without status epilepticus: Secondary | ICD-10-CM | POA: Diagnosis not present

## 2022-10-27 DIAGNOSIS — R569 Unspecified convulsions: Secondary | ICD-10-CM | POA: Diagnosis not present

## 2022-10-27 LAB — HM PAP SMEAR: HM Pap smear: NORMAL

## 2022-10-27 NOTE — Progress Notes (Unsigned)
EEG complete - results pending 

## 2022-11-01 ENCOUNTER — Other Ambulatory Visit: Payer: Self-pay | Admitting: Obstetrics and Gynecology

## 2022-11-01 DIAGNOSIS — R928 Other abnormal and inconclusive findings on diagnostic imaging of breast: Secondary | ICD-10-CM

## 2022-11-01 NOTE — Procedures (Signed)
ELECTROENCEPHALOGRAM REPORT  Date of Study: 10/27/2022  Patient's Name: Kelsey [name] MRN: [id number] Date of Birth: 02/07/1981  Referring Provider: Dr. Patrcia Dolly  Clinical History: This is a 41 year old woman with convulsions and staring episodes with head/gaze turn to the left. EEG for classification.  Medications: Keppra Prozac  Technical Summary: A multichannel digital EEG recording measured by the international 10-20 system with electrodes applied with paste and impedances below 5000 ohms performed in our laboratory with EKG monitoring in an awake and asleep patient.  Hyperventilation and photic stimulation were performed.  The digital EEG was referentially recorded, reformatted, and digitally filtered in a variety of bipolar and referential montages for optimal display.    Description: The patient is awake and asleep during the recording.  During maximal wakefulness, there is a symmetric, medium voltage 10 Hz posterior dominant rhythm that attenuates with eye opening.  The record is symmetric.  During drowsiness and sleep, there is an increase in theta slowing of the background.  Vertex waves and symmetric sleep spindles were seen. Hyperventilation and photic stimulation did not elicit any abnormalities.  There were no epileptiform discharges or electrographic seizures seen.    EKG lead was unremarkable.  Impression: This  awake and asleep EEG is normal.    Clinical Correlation: A normal EEG does not exclude a clinical diagnosis of epilepsy.  If further clinical questions remain, prolonged EEG may be helpful.  Clinical correlation is advised.   Patrcia Dolly, M.D.

## 2022-11-02 ENCOUNTER — Telehealth: Payer: Self-pay

## 2022-11-02 NOTE — Telephone Encounter (Signed)
Pt called an informed that EEG is normal, however it is not like a pregnancy test that is positive or negative, just a snapshot of her brainwaves. Continue Keppra. Proceed with brain MRI

## 2022-11-02 NOTE — Telephone Encounter (Signed)
-----   Message from Van Clines, MD sent at 11/02/2022 12:11 PM EST ----- Pls let her know the EEG is normal, however it is not like a pregnancy test that is positive or negative, just a snapshot of her brainwaves. Continue Keppra. Proceed with brain MRI, thanks

## 2022-11-09 ENCOUNTER — Emergency Department (HOSPITAL_COMMUNITY): Payer: Commercial Managed Care - PPO

## 2022-11-09 ENCOUNTER — Encounter (HOSPITAL_COMMUNITY): Payer: Self-pay | Admitting: *Deleted

## 2022-11-09 ENCOUNTER — Emergency Department (HOSPITAL_COMMUNITY)
Admission: EM | Admit: 2022-11-09 | Discharge: 2022-11-09 | Disposition: A | Discharge status: Home / Self Care | Payer: Commercial Managed Care - PPO | Attending: Student | Admitting: Student

## 2022-11-09 ENCOUNTER — Other Ambulatory Visit: Payer: Self-pay

## 2022-11-09 DIAGNOSIS — I10 Essential (primary) hypertension: Secondary | ICD-10-CM | POA: Diagnosis not present

## 2022-11-09 DIAGNOSIS — R0602 Shortness of breath: Secondary | ICD-10-CM | POA: Insufficient documentation

## 2022-11-09 DIAGNOSIS — R0603 Acute respiratory distress: Secondary | ICD-10-CM | POA: Insufficient documentation

## 2022-11-09 DIAGNOSIS — J45909 Unspecified asthma, uncomplicated: Secondary | ICD-10-CM | POA: Diagnosis not present

## 2022-11-09 DIAGNOSIS — F17210 Nicotine dependence, cigarettes, uncomplicated: Secondary | ICD-10-CM | POA: Diagnosis not present

## 2022-11-09 DIAGNOSIS — J45901 Unspecified asthma with (acute) exacerbation: Secondary | ICD-10-CM

## 2022-11-09 LAB — BLOOD GAS, VENOUS
Acid-Base Excess: 3.7 mmol/L — ABNORMAL HIGH (ref 0.0–2.0)
Bicarbonate: 26.3 mmol/L (ref 20.0–28.0)
Drawn by: 7342
O2 Saturation: 99 %
Patient temperature: 36.5
pCO2, Ven: 32 mmHg — ABNORMAL LOW (ref 44–60)
pH, Ven: 7.52 — ABNORMAL HIGH (ref 7.25–7.43)
pO2, Ven: 92 mmHg — ABNORMAL HIGH (ref 32–45)

## 2022-11-09 LAB — CBC WITH DIFFERENTIAL/PLATELET
Abs Immature Granulocytes: 0.01 10*3/uL (ref 0.00–0.07)
Basophils Absolute: 0 10*3/uL (ref 0.0–0.1)
Basophils Relative: 1 %
Eosinophils Absolute: 0.4 10*3/uL (ref 0.0–0.5)
Eosinophils Relative: 7 %
HCT: 43.7 % (ref 36.0–46.0)
Hemoglobin: 14.8 g/dL (ref 12.0–15.0)
Immature Granulocytes: 0 %
Lymphocytes Relative: 31 %
Lymphs Abs: 2 10*3/uL (ref 0.7–4.0)
MCH: 33.2 pg (ref 26.0–34.0)
MCHC: 33.9 g/dL (ref 30.0–36.0)
MCV: 98 fL (ref 80.0–100.0)
Monocytes Absolute: 0.6 10*3/uL (ref 0.1–1.0)
Monocytes Relative: 9 %
Neutro Abs: 3.4 10*3/uL (ref 1.7–7.7)
Neutrophils Relative %: 52 %
Platelets: 298 10*3/uL (ref 150–400)
RBC: 4.46 MIL/uL (ref 3.87–5.11)
RDW: 12.8 % (ref 11.5–15.5)
WBC: 6.5 10*3/uL (ref 4.0–10.5)
nRBC: 0 % (ref 0.0–0.2)

## 2022-11-09 LAB — COMPREHENSIVE METABOLIC PANEL
ALT: 10 U/L (ref 0–44)
AST: 14 U/L — ABNORMAL LOW (ref 15–41)
Albumin: 3.8 g/dL (ref 3.5–5.0)
Alkaline Phosphatase: 65 U/L (ref 38–126)
Anion gap: 7 (ref 5–15)
BUN: 9 mg/dL (ref 6–20)
CO2: 23 mmol/L (ref 22–32)
Calcium: 8.6 mg/dL — ABNORMAL LOW (ref 8.9–10.3)
Chloride: 105 mmol/L (ref 98–111)
Creatinine, Ser: 0.51 mg/dL (ref 0.44–1.00)
GFR, Estimated: >60 mL/min (ref >60)
Glucose, Bld: 87 mg/dL (ref 70–99)
Potassium: 3.5 mmol/L (ref 3.5–5.1)
Sodium: 135 mmol/L (ref 135–145)
Total Bilirubin: 0.4 mg/dL (ref 0.3–1.2)
Total Protein: 7.3 g/dL (ref 6.5–8.1)

## 2022-11-09 LAB — TROPONIN I (HIGH SENSITIVITY): Troponin I (High Sensitivity): 3 ng/L (ref <18)

## 2022-11-09 LAB — BRAIN NATRIURETIC PEPTIDE: B Natriuretic Peptide: 67 pg/mL (ref 0.0–100.0)

## 2022-11-09 MED ORDER — ALBUTEROL (5 MG/ML) CONTINUOUS INHALATION SOLN
10.0000 mg/h | INHALATION_SOLUTION | Freq: Once | RESPIRATORY_TRACT | Status: AC
  Administered 2022-11-09: 10 mg/h via RESPIRATORY_TRACT
  Filled 2022-11-09: qty 20

## 2022-11-09 MED ORDER — IPRATROPIUM-ALBUTEROL 0.5-2.5 (3) MG/3ML IN SOLN
9.0000 mL | Freq: Once | RESPIRATORY_TRACT | Status: AC
  Administered 2022-11-09: 9 mL via RESPIRATORY_TRACT
  Filled 2022-11-09: qty 9

## 2022-11-09 MED ORDER — METHYLPREDNISOLONE SODIUM SUCC 125 MG IJ SOLR
125.0000 mg | Freq: Once | INTRAMUSCULAR | Status: AC
  Administered 2022-11-09: 125 mg via INTRAVENOUS
  Filled 2022-11-09: qty 2

## 2022-11-09 MED ORDER — ALBUTEROL SULFATE HFA 108 (90 BASE) MCG/ACT IN AERS
1.0000 | INHALATION_SPRAY | Freq: Once | RESPIRATORY_TRACT | Status: AC
  Administered 2022-11-09: 1 via RESPIRATORY_TRACT
  Filled 2022-11-09: qty 6.7

## 2022-11-09 MED ORDER — MAGNESIUM SULFATE 2 GM/50ML IV SOLN
2.0000 g | Freq: Once | INTRAVENOUS | Status: AC
  Administered 2022-11-09: 2 g via INTRAVENOUS
  Filled 2022-11-09: qty 50

## 2022-11-09 MED ORDER — PREDNISONE 10 MG PO TABS: 40.0000 mg | ORAL_TABLET | Freq: Every day | ORAL | 0 refills | Status: DC

## 2022-11-09 NOTE — ED Triage Notes (Signed)
Pt c/o sob x one week  Pt denies any pain

## 2022-11-09 NOTE — ED Notes (Signed)
Pt ambulated to bathroom without assistance or complications. Nurse notified.

## 2022-11-09 NOTE — ED Provider Notes (Signed)
[facility] Provider Note  CSN: [id number] Arrival date & time: 11/09/22 0744  Chief Complaint(s) Shortness of Breath  HPI Kelsey [name] is a 41 y.o. female with PMH asthma, GERD, PVC, seizures, previous history of alcohol abuse and polysubstance use but currently in remission who presents emergency department for evaluation of shortness of breath.  Patient states that she has had worsening asthma, shortness of breath and wheezing over the last 1 week and ran out of her inhaler last night.  She comes the emergency department with significant shortness of breath, tachypnea and accessory muscle use.  She is maintaining her oxygen saturations at room air but appears uncomfortable.  Denies chest pain, abdominal pain, nausea, vomiting or other systemic symptoms.   Past Medical History Past Medical History:  Diagnosis Date   Anxiety    Asthma    hosp. 2018 and 2019 no h/o intubation    Depression    GERD (gastroesophageal reflux disease)    Hypertension    Palpitations    Perianal abscess 11/22/2016   12/07/2016.  [facility] [place]  Ovidio Kinavid Newman, MD.  Perianal abscess is much better but still needs local wound care.  Continue sitz baths 2-3 times perday until all drainage and pain are gone.  Follow up as needed.   Preop general physical exam 01/26/2022   PVC (premature ventricular contraction)    Seasonal allergies    Seizures (HCC)    01/15/22 was last seizure   Tachycardia    Patient Active Problem List   Diagnosis Date Noted   Alcohol dependence in remission (HCC) 09/02/2022   Opioid dependence in remission (HCC) 09/02/2022   Seizures (HCC) 06/24/2022   Anxiety with depression 06/24/2022   Protein-calorie malnutrition, moderate (HCC) 06/24/2022   Alcohol withdrawal seizure (HCC) 06/23/2022   Alcohol use disorder, severe, dependence (HCC) 06/20/2022   MDD (major depressive disorder), recurrent severe, without psychosis (HCC) 06/20/2022   Tobacco  use disorder 06/20/2022   Cannabis abuse 06/20/2022   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   Hospital discharge follow-up 02/23/2022   Status post left hip replacement 02/17/2022   Transaminitis 01/26/2022   Macrocytic anemia 01/26/2022   Thrombocytopenia (HCC) 01/18/2022   Avascular necrosis of bone of left hip (HCC) 12/20/2021   Avascular necrosis of bone of right hip (HCC) 12/20/2021   Lumbar spondylosis 10/05/2021   Positive double stranded DNA antibody test 09/07/2021   Mixed hyperlipidemia 08/16/2021   Seizure disorder (HCC) 07/28/2021   Chronic hip pain 07/28/2021   Essential hypertension 07/28/2021   Tobacco abuse 07/28/2021   Polyarthritis 07/28/2021   Anal skin tag 10/21/2019   Internal hemorrhoids 09/26/2017   Asthma 10/15/2015   Allergic rhinitis 10/15/2015   GAD (generalized anxiety disorder) 07/14/2015   Depression, major, recurrent, moderate (HCC) 07/14/2015   GERD (gastroesophageal reflux disease) 08/22/2013   Home Medication(s) Prior to Admission medications   Medication Sig Start Date End Date Taking? Authorizing Provider  albuterol (VENTOLIN HFA) 108 (90 Base) MCG/ACT inhaler Inhale 1-2 puffs into the lungs every 6 (six) hours as needed for wheezing or shortness of breath. 08/28/22   Anabel HalonPatel, Rutwik K, MD  FLUoxetine (PROZAC) 40 MG capsule Take 2 capsules (80 mg total) by mouth daily. 08/28/22   Anabel HalonPatel, Rutwik K, MD  levETIRAcetam (KEPPRA) 500 MG tablet Take 1 tablet (500 mg total) by mouth 2 (two) times daily. 10/04/22   Van ClinesAquino, Karen M, MD  levonorgestrel (MIRENA) 20 MCG/24HR IUD 1 Intra Uterine Device (1  each total) by Intrauterine route once. 07/16/15   Adonis Brook, NP                                                                                                                                    Past Surgical History Past Surgical History:  Procedure Laterality Date   CESAREAN SECTION  10/15/2009, 02/08/2006   INCISION AND DRAINAGE  PERIRECTAL ABSCESS Bilateral 11/22/2016   Procedure: IRRIGATION AND DEBRIDEMENT PERIRECTAL ABSCESS;  Surgeon: Ovidio Kin, MD;  Location: [facility];  Service: General;  Laterality: Bilateral;   INTRAUTERINE DEVICE INSERTION     Mirena 12/24/19 exp 12/28/21 Dr Philip Aspen [facility]   TOTAL HIP ARTHROPLASTY Left 02/17/2022   Procedure: Left TOTAL HIP ARTHROPLASTY ANTERIOR APPROACH;  Surgeon: Kathryne Hitch, MD;  Location: [facility];  Service: Orthopedics;  Laterality: Left;   TOTAL HIP ARTHROPLASTY Right 05/05/2022   Procedure: RIGHT TOTAL HIP ARTHROPLASTY ANTERIOR APPROACH;  Surgeon: Kathryne Hitch, MD;  Location: [facility];  Service: Orthopedics;  Laterality: Right;   Family History Family History  Problem Relation Age of Onset   Rheumatologic disease Mother    Arthritis Mother    Asthma Mother    Depression Mother    Heart disease Father    Asthma Maternal Grandmother    Hypertension Maternal Grandmother    Heart disease Maternal Grandfather    Lung cancer Paternal Grandmother    Healthy Daughter    Healthy Son     Social History Social History   Tobacco Use   Smoking status: Every Day    Packs/day: 0.25    Years: 10.00    Total pack years: 2.50    Types: Cigarettes   Smokeless tobacco: Never  Vaping Use   Vaping Use: Never used  Substance Use Topics   Alcohol use: Yes    Alcohol/week: 4.0 standard drinks of alcohol    Types: 4 Shots of liquor per week   Drug use: Not Currently    Types: Marijuana   Allergies Penicillins  Review of Systems Review of Systems  Respiratory:  Positive for cough, chest tightness, shortness of breath and wheezing.     Physical Exam Vital Signs  I have reviewed the triage vital signs BP 120/82   Pulse 98   Temp 97.7 F (36.5 C) (Oral)   Ht 5\' 4"  (1.626 m)   Wt 59 kg   SpO2 98%   BMI 22.31 kg/m   Physical Exam Vitals and nursing note reviewed.  Constitutional:      General: She is in acute distress.      Appearance: She is well-developed. She is ill-appearing.  HENT:     Head: Normocephalic and atraumatic.  Eyes:     Conjunctiva/sclera: Conjunctivae normal.  Cardiovascular:     Rate and Rhythm: Normal rate and regular rhythm.     Heart sounds: No murmur heard. Pulmonary:     Effort:  Tachypnea, accessory muscle usage and respiratory distress present.     Breath sounds: Wheezing present.  Abdominal:     Palpations: Abdomen is soft.     Tenderness: There is no abdominal tenderness.  Musculoskeletal:        General: No swelling.     Cervical back: Neck supple.  Skin:    General: Skin is warm and dry.     Capillary Refill: Capillary refill takes less than 2 seconds.  Neurological:     Mental Status: She is alert.  Psychiatric:        Mood and Affect: Mood normal.     ED Results and Treatments Labs (all labs ordered are listed, but only abnormal results are displayed) Labs Reviewed  BRAIN NATRIURETIC PEPTIDE  COMPREHENSIVE METABOLIC PANEL  CBC WITH DIFFERENTIAL/PLATELET  BLOOD GAS, VENOUS  TROPONIN I (HIGH SENSITIVITY)                                                                                                                          Radiology No results found.  Pertinent labs & imaging results that were available during my care of the patient were reviewed by me and considered in my medical decision making (see MDM for details).  Medications Ordered in ED Medications  ipratropium-albuterol (DUONEB) 0.5-2.5 (3) MG/3ML nebulizer solution 9 mL (has no administration in time range)  magnesium sulfate IVPB 2 g 50 mL (has no administration in time range)  methylPREDNISolone sodium succinate (SOLU-MEDROL) 125 mg/2 mL injection 125 mg (125 mg Intravenous Given 11/09/22 0819)                                                                                                                                     Procedures .Critical Care  Performed by: Glendora Score,  MD Authorized by: Glendora Score, MD   Critical care provider statement:    Critical care time (minutes):  30   Critical care was necessary to treat or prevent imminent or life-threatening deterioration of the following conditions:  Respiratory failure   Critical care was time spent personally by me on the following activities:  Development of treatment plan with patient or surrogate, discussions with consultants, evaluation of patient's response to treatment, examination of patient, ordering and review of laboratory studies, ordering and review of radiographic studies, ordering and performing treatments and interventions, pulse oximetry, re-evaluation of patient's condition and review  of old charts   (including critical care time)  Medical Decision Making / ED Course   This patient presents to the ED for concern of shortness of breath, wheezing, this involves an extensive number of treatment options, and is a complaint that carries with it a high risk of complications and morbidity.  The differential diagnosis includes asthma exacerbation, pneumonia, COPD, viral URI  MDM: Patient seen emerged part for evaluation of wheezing and shortness of breath.  Physical exam reveals an ill-appearing tachypneic patient with accessory muscle use and expiratory wheezing bilaterally.  Laboratory evaluation largely unremarkable with normal high-sensitivity troponin and pH 7.52 with no hypercarbia.  Chest x-ray with no pneumonia and a possible trace left pleural effusion.  BNP is normal.  Patient given methylprednisolone, magnesium, 3 DuoNebs and on reevaluation work of breathing is improving but wheezing is persistent and thus patient had an additional hour-long albuterol treatment.  On second reevaluation, patient is expectedly tachycardic from the medication but work of breathing significantly improved and wheezing has resolved.  As she had great response ER interventions, we have shared decision-making  discussion but patient are both in agreement that she would be safe to go home today with return precautions which she voiced understanding.  And albuterol inhaler was brought to bedside patient was discharged with 4 days of prednisone with outpatient follow-up and strict return precautions.   Additional history obtained:  -External records from outside source obtained and reviewed including: Chart review including previous notes, labs, imaging, consultation notes   Lab Tests: -I ordered, reviewed, and interpreted labs.   The pertinent results include:   Labs Reviewed  BRAIN NATRIURETIC PEPTIDE  COMPREHENSIVE METABOLIC PANEL  CBC WITH DIFFERENTIAL/PLATELET  BLOOD GAS, VENOUS  TROPONIN I (HIGH SENSITIVITY)      EKG   EKG Interpretation  Date/Time:  Thursday November 09 2022 08:03:34 EST Ventricular Rate:  99 PR Interval:  118 QRS Duration: 72 QT Interval:  374 QTC Calculation: 479 R Axis:   85 Text Interpretation: Normal sinus rhythm Anterior infarct , age undetermined When compared with ECG of 23-Jun-2022 14:26, PREVIOUS ECG IS PRESENT Confirmed by Erikson Danzy ([id number]) on 11/09/2022 10:59:52 AM         Imaging Studies ordered: I ordered imaging studies including chest x-ray I independently visualized and interpreted imaging. I agree with the radiologist interpretation   Medicines ordered and prescription drug management: Meds ordered this encounter  Medications   ipratropium-albuterol (DUONEB) 0.5-2.5 (3) MG/3ML nebulizer solution 9 mL   methylPREDNISolone sodium succinate (SOLU-MEDROL) 125 mg/2 mL injection 125 mg   magnesium sulfate IVPB 2 g 50 mL    -I have reviewed the patients home medicines and have made adjustments as needed  Critical interventions Albuterol, multiple DuoNeb's, steroids, magnesium   Cardiac Monitoring: The patient was maintained on a cardiac monitor.  I personally viewed and interpreted the cardiac monitored which showed an underlying  rhythm of: NSR, sinus tachycardia  Social Determinants of Health:  Factors impacting patients care include: Ran out of her albuterol inhaler   Reevaluation: After the interventions noted above, I reevaluated the patient and found that they have :improved  Co morbidities that complicate the patient evaluation  Past Medical History:  Diagnosis Date   Anxiety    Asthma    hosp. 2018 and 2019 no h/o intubation    Depression    GERD (gastroesophageal reflux disease)    Hypertension    Palpitations    Perianal abscess 11/22/2016   12/07/2016.  [facility]  Ovidio Kin, MD.  Perianal abscess is much better but still needs local wound care.  Continue sitz baths 2-3 times perday until all drainage and pain are gone.  Follow up as needed.   Preop general physical exam 01/26/2022   PVC (premature ventricular contraction)    Seasonal allergies    Seizures (HCC)    01/15/22 was last seizure   Tachycardia       Dispostion: I considered admission for this patient, but with dramatic improvement from ER interventions, patient safe to discharge the patient follow-up and strict return precautions which she voiced understanding     Final Clinical Impression(s) / ED Diagnoses Final diagnoses:  None     @PCDICTATION @    , MD 11/09/22 1100

## 2022-11-11 ENCOUNTER — Ambulatory Visit
Admission: RE | Admit: 2022-11-11 | Discharge: 2022-11-11 | Disposition: A | Discharge status: Home / Self Care | Payer: Commercial Managed Care - PPO | Source: Ambulatory Visit | Attending: Obstetrics and Gynecology | Admitting: Obstetrics and Gynecology

## 2022-11-11 DIAGNOSIS — R928 Other abnormal and inconclusive findings on diagnostic imaging of breast: Secondary | ICD-10-CM

## 2022-11-12 ENCOUNTER — Emergency Department (HOSPITAL_COMMUNITY): Payer: Commercial Managed Care - PPO

## 2022-11-12 ENCOUNTER — Inpatient Hospital Stay (HOSPITAL_COMMUNITY)
Admission: EM | Admit: 2022-11-12 | Discharge: 2022-11-15 | DRG: 202 | Disposition: A | Discharge status: Home / Self Care | Payer: Commercial Managed Care - PPO | Attending: Internal Medicine | Admitting: Internal Medicine

## 2022-11-12 ENCOUNTER — Encounter (HOSPITAL_COMMUNITY): Payer: Self-pay

## 2022-11-12 ENCOUNTER — Other Ambulatory Visit: Payer: Self-pay

## 2022-11-12 DIAGNOSIS — Z7952 Long term (current) use of systemic steroids: Secondary | ICD-10-CM

## 2022-11-12 DIAGNOSIS — K219 Gastro-esophageal reflux disease without esophagitis: Secondary | ICD-10-CM | POA: Diagnosis present

## 2022-11-12 DIAGNOSIS — J205 Acute bronchitis due to respiratory syncytial virus: Secondary | ICD-10-CM | POA: Diagnosis not present

## 2022-11-12 DIAGNOSIS — E785 Hyperlipidemia, unspecified: Secondary | ICD-10-CM | POA: Diagnosis present

## 2022-11-12 DIAGNOSIS — R569 Unspecified convulsions: Secondary | ICD-10-CM

## 2022-11-12 DIAGNOSIS — J9601 Acute respiratory failure with hypoxia: Secondary | ICD-10-CM | POA: Diagnosis present

## 2022-11-12 DIAGNOSIS — Z79899 Other long term (current) drug therapy: Secondary | ICD-10-CM

## 2022-11-12 DIAGNOSIS — B338 Other specified viral diseases: Secondary | ICD-10-CM | POA: Diagnosis not present

## 2022-11-12 DIAGNOSIS — J4521 Mild intermittent asthma with (acute) exacerbation: Secondary | ICD-10-CM

## 2022-11-12 DIAGNOSIS — J45901 Unspecified asthma with (acute) exacerbation: Secondary | ICD-10-CM | POA: Diagnosis present

## 2022-11-12 DIAGNOSIS — Z720 Tobacco use: Secondary | ICD-10-CM | POA: Diagnosis present

## 2022-11-12 DIAGNOSIS — G40909 Epilepsy, unspecified, not intractable, without status epilepticus: Secondary | ICD-10-CM | POA: Diagnosis present

## 2022-11-12 DIAGNOSIS — J302 Other seasonal allergic rhinitis: Secondary | ICD-10-CM | POA: Diagnosis present

## 2022-11-12 DIAGNOSIS — Z91148 Patient's other noncompliance with medication regimen for other reason: Secondary | ICD-10-CM

## 2022-11-12 DIAGNOSIS — F17210 Nicotine dependence, cigarettes, uncomplicated: Secondary | ICD-10-CM | POA: Diagnosis present

## 2022-11-12 DIAGNOSIS — I10 Essential (primary) hypertension: Secondary | ICD-10-CM | POA: Diagnosis present

## 2022-11-12 DIAGNOSIS — Z8249 Family history of ischemic heart disease and other diseases of the circulatory system: Secondary | ICD-10-CM

## 2022-11-12 DIAGNOSIS — Z96643 Presence of artificial hip joint, bilateral: Secondary | ICD-10-CM | POA: Diagnosis present

## 2022-11-12 DIAGNOSIS — F32A Depression, unspecified: Secondary | ICD-10-CM | POA: Diagnosis present

## 2022-11-12 DIAGNOSIS — Z880 Allergy status to penicillin: Secondary | ICD-10-CM

## 2022-11-12 DIAGNOSIS — F419 Anxiety disorder, unspecified: Secondary | ICD-10-CM | POA: Diagnosis present

## 2022-11-12 DIAGNOSIS — Z825 Family history of asthma and other chronic lower respiratory diseases: Secondary | ICD-10-CM

## 2022-11-12 DIAGNOSIS — Z20822 Contact with and (suspected) exposure to covid-19: Secondary | ICD-10-CM | POA: Diagnosis present

## 2022-11-12 LAB — CBC WITH DIFFERENTIAL/PLATELET
Abs Immature Granulocytes: 0.04 10*3/uL (ref 0.00–0.07)
Basophils Absolute: 0 10*3/uL (ref 0.0–0.1)
Basophils Relative: 0 %
Eosinophils Absolute: 0 10*3/uL (ref 0.0–0.5)
Eosinophils Relative: 0 %
HCT: 42.9 % (ref 36.0–46.0)
Hemoglobin: 14 g/dL (ref 12.0–15.0)
Immature Granulocytes: 0 %
Lymphocytes Relative: 13 %
Lymphs Abs: 1.4 10*3/uL (ref 0.7–4.0)
MCH: 32.6 pg (ref 26.0–34.0)
MCHC: 32.6 g/dL (ref 30.0–36.0)
MCV: 99.8 fL (ref 80.0–100.0)
Monocytes Absolute: 0.9 10*3/uL (ref 0.1–1.0)
Monocytes Relative: 9 %
Neutro Abs: 8.5 10*3/uL — ABNORMAL HIGH (ref 1.7–7.7)
Neutrophils Relative %: 78 %
Platelets: 278 10*3/uL (ref 150–400)
RBC: 4.3 MIL/uL (ref 3.87–5.11)
RDW: 13 % (ref 11.5–15.5)
WBC: 10.9 10*3/uL — ABNORMAL HIGH (ref 4.0–10.5)
nRBC: 0 % (ref 0.0–0.2)

## 2022-11-12 LAB — BASIC METABOLIC PANEL
Anion gap: 7 (ref 5–15)
BUN: 17 mg/dL (ref 6–20)
CO2: 25 mmol/L (ref 22–32)
Calcium: 8.6 mg/dL — ABNORMAL LOW (ref 8.9–10.3)
Chloride: 104 mmol/L (ref 98–111)
Creatinine, Ser: 0.55 mg/dL (ref 0.44–1.00)
GFR, Estimated: >60 mL/min (ref >60)
Glucose, Bld: 84 mg/dL (ref 70–99)
Potassium: 3.5 mmol/L (ref 3.5–5.1)
Sodium: 136 mmol/L (ref 135–145)

## 2022-11-12 LAB — RESP PANEL BY RT-PCR (RSV, FLU A&B, COVID)  RVPGX2
Influenza A by PCR: NEGATIVE
Influenza B by PCR: NEGATIVE
Resp Syncytial Virus by PCR: POSITIVE — AB
SARS Coronavirus 2 by RT PCR: NEGATIVE

## 2022-11-12 LAB — CREATININE, SERUM
Creatinine, Ser: 0.57 mg/dL (ref 0.44–1.00)
GFR, Estimated: >60 mL/min (ref >60)

## 2022-11-12 LAB — CBC
HCT: 40.5 % (ref 36.0–46.0)
Hemoglobin: 13.1 g/dL (ref 12.0–15.0)
MCH: 32.8 pg (ref 26.0–34.0)
MCHC: 32.3 g/dL (ref 30.0–36.0)
MCV: 101.3 fL — ABNORMAL HIGH (ref 80.0–100.0)
Platelets: 282 10*3/uL (ref 150–400)
RBC: 4 MIL/uL (ref 3.87–5.11)
RDW: 13.2 % (ref 11.5–15.5)
WBC: 11.3 10*3/uL — ABNORMAL HIGH (ref 4.0–10.5)
nRBC: 0 % (ref 0.0–0.2)

## 2022-11-12 LAB — RAPID URINE DRUG SCREEN, HOSP PERFORMED
Amphetamines: NOT DETECTED
Barbiturates: NOT DETECTED
Benzodiazepines: NOT DETECTED
Cocaine: NOT DETECTED
Opiates: NOT DETECTED
Tetrahydrocannabinol: POSITIVE — AB

## 2022-11-12 LAB — PROCALCITONIN: Procalcitonin: <0.1 ng/mL

## 2022-11-12 LAB — MAGNESIUM: Magnesium: 1.9 mg/dL (ref 1.7–2.4)

## 2022-11-12 LAB — PHOSPHORUS: Phosphorus: 2.3 mg/dL — ABNORMAL LOW (ref 2.5–4.6)

## 2022-11-12 MED ORDER — ONDANSETRON HCL 4 MG/2ML IJ SOLN: 4.0000 mg | Freq: Four times a day (QID) | INTRAMUSCULAR | Status: DC | PRN

## 2022-11-12 MED ORDER — DOXYCYCLINE HYCLATE 100 MG PO TABS
100.0000 mg | ORAL_TABLET | Freq: Once | ORAL | Status: AC
  Administered 2022-11-12: 100 mg via ORAL
  Filled 2022-11-12: qty 1

## 2022-11-12 MED ORDER — METHYLPREDNISOLONE SODIUM SUCC 125 MG IJ SOLR
INTRAMUSCULAR | Status: AC
  Administered 2022-11-12: 125 mg via INTRAVENOUS
  Filled 2022-11-12: qty 2

## 2022-11-12 MED ORDER — ALBUTEROL (5 MG/ML) CONTINUOUS INHALATION SOLN
10.0000 mg/h | INHALATION_SOLUTION | RESPIRATORY_TRACT | Status: DC
  Administered 2022-11-12: 10 mg/h via RESPIRATORY_TRACT
  Filled 2022-11-12: qty 20

## 2022-11-12 MED ORDER — AZITHROMYCIN 250 MG PO TABS
500.0000 mg | ORAL_TABLET | Freq: Every day | ORAL | Status: AC
  Administered 2022-11-12: 500 mg via ORAL
  Filled 2022-11-12: qty 2

## 2022-11-12 MED ORDER — METHYLPREDNISOLONE SODIUM SUCC 125 MG IJ SOLR
125.0000 mg | Freq: Once | INTRAMUSCULAR | Status: AC
  Filled 2022-11-12: qty 2

## 2022-11-12 MED ORDER — ENOXAPARIN SODIUM 40 MG/0.4ML IJ SOSY
40.0000 mg | PREFILLED_SYRINGE | INTRAMUSCULAR | Status: DC
  Administered 2022-11-12 – 2022-11-15 (×4): 40 mg via SUBCUTANEOUS
  Filled 2022-11-12 (×4): qty 0.4

## 2022-11-12 MED ORDER — PANTOPRAZOLE SODIUM 40 MG PO TBEC
40.0000 mg | DELAYED_RELEASE_TABLET | Freq: Every day | ORAL | Status: DC
  Administered 2022-11-12 – 2022-11-15 (×4): 40 mg via ORAL
  Filled 2022-11-12 (×4): qty 1

## 2022-11-12 MED ORDER — LEVETIRACETAM 500 MG PO TABS
500.0000 mg | ORAL_TABLET | Freq: Two times a day (BID) | ORAL | Status: DC
  Administered 2022-11-12 – 2022-11-15 (×7): 500 mg via ORAL
  Filled 2022-11-12 (×7): qty 1

## 2022-11-12 MED ORDER — AZITHROMYCIN 250 MG PO TABS
250.0000 mg | ORAL_TABLET | Freq: Every day | ORAL | Status: DC
  Administered 2022-11-13 – 2022-11-15 (×3): 250 mg via ORAL
  Filled 2022-11-12 (×3): qty 1

## 2022-11-12 MED ORDER — LACTATED RINGERS IV BOLUS
1000.0000 mL | Freq: Once | INTRAVENOUS | Status: AC
  Administered 2022-11-12: 1000 mL via INTRAVENOUS

## 2022-11-12 MED ORDER — METHYLPREDNISOLONE SODIUM SUCC 40 MG IJ SOLR
40.0000 mg | Freq: Two times a day (BID) | INTRAMUSCULAR | Status: DC
  Administered 2022-11-12: 40 mg via INTRAVENOUS
  Filled 2022-11-12: qty 1

## 2022-11-12 MED ORDER — IPRATROPIUM-ALBUTEROL 0.5-2.5 (3) MG/3ML IN SOLN: 3.0000 mL | RESPIRATORY_TRACT | Status: DC | PRN

## 2022-11-12 MED ORDER — ONDANSETRON HCL 4 MG PO TABS: 4.0000 mg | ORAL_TABLET | Freq: Four times a day (QID) | ORAL | Status: DC | PRN

## 2022-11-12 MED ORDER — LEVETIRACETAM 500 MG PO TABS: 500.0000 mg | ORAL_TABLET | Freq: Two times a day (BID) | ORAL | Status: DC

## 2022-11-12 MED ORDER — FLUOXETINE HCL 20 MG PO CAPS: 80.0000 mg | ORAL_CAPSULE | Freq: Every day | ORAL | Status: DC

## 2022-11-12 MED ORDER — HYDROCODONE BIT-HOMATROP MBR 5-1.5 MG/5ML PO SOLN
5.0000 mL | ORAL | Status: DC | PRN
  Administered 2022-11-12 – 2022-11-14 (×5): 5 mL via ORAL
  Filled 2022-11-12 (×6): qty 5

## 2022-11-12 MED ORDER — LEVALBUTEROL HCL 0.63 MG/3ML IN NEBU
0.6300 mg | INHALATION_SOLUTION | Freq: Four times a day (QID) | RESPIRATORY_TRACT | Status: DC
  Administered 2022-11-12 – 2022-11-13 (×5): 0.63 mg via RESPIRATORY_TRACT
  Filled 2022-11-12 (×5): qty 3

## 2022-11-12 MED ORDER — IPRATROPIUM-ALBUTEROL 0.5-2.5 (3) MG/3ML IN SOLN: 3.0000 mL | Freq: Four times a day (QID) | RESPIRATORY_TRACT | Status: DC

## 2022-11-12 MED ORDER — IPRATROPIUM BROMIDE 0.02 % IN SOLN
0.5000 mg | Freq: Four times a day (QID) | RESPIRATORY_TRACT | Status: DC
  Administered 2022-11-12 – 2022-11-13 (×5): 0.5 mg via RESPIRATORY_TRACT
  Filled 2022-11-12 (×5): qty 2.5

## 2022-11-12 MED ORDER — FLUOXETINE HCL 20 MG PO CAPS
60.0000 mg | ORAL_CAPSULE | Freq: Every day | ORAL | Status: DC
  Administered 2022-11-12 – 2022-11-15 (×4): 60 mg via ORAL
  Filled 2022-11-12 (×4): qty 3

## 2022-11-12 MED ORDER — HYDRALAZINE HCL 20 MG/ML IJ SOLN: 10.0000 mg | Freq: Four times a day (QID) | INTRAMUSCULAR | Status: DC | PRN

## 2022-11-12 MED ORDER — ALBUTEROL SULFATE (2.5 MG/3ML) 0.083% IN NEBU
2.5000 mg | INHALATION_SOLUTION | RESPIRATORY_TRACT | Status: DC
  Administered 2022-11-12: 2.5 mg via RESPIRATORY_TRACT
  Filled 2022-11-12: qty 3

## 2022-11-12 MED ORDER — BUDESONIDE 0.5 MG/2ML IN SUSP
0.5000 mg | Freq: Two times a day (BID) | RESPIRATORY_TRACT | Status: DC
  Administered 2022-11-12 – 2022-11-15 (×7): 0.5 mg via RESPIRATORY_TRACT
  Filled 2022-11-12 (×7): qty 2

## 2022-11-12 MED ORDER — METHYLPREDNISOLONE SODIUM SUCC 125 MG IJ SOLR
60.0000 mg | Freq: Two times a day (BID) | INTRAMUSCULAR | Status: DC
  Administered 2022-11-12 – 2022-11-15 (×6): 60 mg via INTRAVENOUS
  Filled 2022-11-12 (×6): qty 2

## 2022-11-12 MED ORDER — MAGNESIUM SULFATE 2 GM/50ML IV SOLN
INTRAVENOUS | Status: AC
  Administered 2022-11-12: 2 g via INTRAVENOUS
  Filled 2022-11-12: qty 50

## 2022-11-12 MED ORDER — IPRATROPIUM BROMIDE 0.02 % IN SOLN
1.0000 mg | Freq: Once | RESPIRATORY_TRACT | Status: AC
  Administered 2022-11-12: 1 mg via RESPIRATORY_TRACT
  Filled 2022-11-12: qty 5

## 2022-11-12 MED ORDER — ACETAMINOPHEN 650 MG RE SUPP: 650.0000 mg | Freq: Four times a day (QID) | RECTAL | Status: DC | PRN

## 2022-11-12 MED ORDER — AMLODIPINE BESYLATE 5 MG PO TABS: 5.0000 mg | ORAL_TABLET | Freq: Every day | ORAL | Status: DC

## 2022-11-12 MED ORDER — FLUOXETINE HCL 20 MG PO CAPS: 40.0000 mg | ORAL_CAPSULE | Freq: Every day | ORAL | Status: DC

## 2022-11-12 MED ORDER — DM-GUAIFENESIN ER 30-600 MG PO TB12
1.0000 | ORAL_TABLET | Freq: Two times a day (BID) | ORAL | Status: DC
  Administered 2022-11-12 – 2022-11-15 (×7): 1 via ORAL
  Filled 2022-11-12 (×7): qty 1

## 2022-11-12 MED ORDER — MAGNESIUM SULFATE 2 GM/50ML IV SOLN
2.0000 g | Freq: Once | INTRAVENOUS | Status: AC
  Filled 2022-11-12: qty 50

## 2022-11-12 MED ORDER — ACETAMINOPHEN 325 MG PO TABS: 650.0000 mg | ORAL_TABLET | Freq: Four times a day (QID) | ORAL | Status: DC | PRN

## 2022-11-12 NOTE — ED Triage Notes (Signed)
Pt arrived via POV from work where Pt reports her asthma flared up. Pt reports being on prescribed steroids still, using her inhaler as instructed and received 2 Neb Treatments at work PTA. Pt is 96% RA in Triage but tachypnic.

## 2022-11-12 NOTE — ED Notes (Signed)
RT paged to Pt bedside.

## 2022-11-12 NOTE — Discharge Summary (Incomplete)
Physician Discharge Summary   Patient: Kelsey [name] MRN: [id number] DOB: 1980-12-11  Admit date:     11/12/2022  Discharge date: 11/14/2022  Discharge Physician: Onalee Hua Alayssa Flinchum   PCP: Anabel Halon, MD   Recommendations at discharge:   Please follow up with primary care provider within 1-2 weeks  Please repeat BMP and CBC in one week      Hospital Course: 41 year old female with a history of hypertension, hyperlipidemia, asthma, seizure disorder, anxiety, tobacco abuse, polysubstance abuse (alcohol, benzodiazepines, THC) in remission presenting with nearly 10-day history of shortness of breath and nonproductive cough.  The patient visited the ED on 11/09/2022.  She was given nebulizer treatments, Solu-Medrol, magnesium with improvement, and she was sent home in stable condition with albuterol inhaler and prednisone 40 mg daily.  Unfortunately, she continued to have shortness of breath and coughing.  This worsened on 11/11/2022.  As result she presented for further evaluation and treatment.  She denies any fevers, chills, chest pain, hemoptysis, nausea, vomiting, diarrhea, abdominal pain.  She states that her stepson has also been sick with a respiratory illness. In the ED, the patient was afebrile and hemodynamically stable with oxygen saturation 87% room air.  RSV was positive.  Chest x-ray showed hyperacute inflation with right lower lobe haziness.  WBC 10.9, hemoglobin 14.0, platelets 270,000.  Sodium 136, potassium 3.5, bicarbonate 25, serum creatinine 0.55.  EKG shows sinus tachycardia with nonspecific T wave changes.  The patient was given an hour-long nebulizer treatment and started on intravenous Solu-Medrol.  She was admitted for further evaluation and treatment.  Assessment and Plan: Acute Respiratory Failure with Hypoxia -due to Asthma exacerbation in setting of RSV -Presented with tachypnea and oxygen saturation 87% on room air -Stable 2 L -Wean oxygen for saturation greater  92% -ambulatory pulse ox on the day of d/c   Acute asthma exacerbation -She is not on any maintenance medications at home -Started budesonide -Started Xopenex in the setting of tachycardia with albuterol -Continue IV Solu-Medrol>>d/c home with prednisone 50 mg daily x 5 more days   RSV bronchitis -Symptomatic treatment as above   Tobacco abuse -She has a 10-15-pack-year history -Tobacco cessation discussed   Seizure disorder -Follows Dr. Patrcia Dolly -Restart Keppra   Anxiety -Continue fluoxetine   THC use -Risks and benefits discussed      {Tip this will not be part of the note when signed Body mass index is 22.32 kg/m. , ,  (Optional):26781}   Consultants: none Procedures performed: none  Disposition: Home Diet recommendation:  Regular diet DISCHARGE MEDICATION: Allergies as of 11/12/2022       Reactions   Penicillins Rash   Has patient had a PCN reaction causing immediate rash, facial/tongue/throat swelling, SOB or lightheadedness with hypotension: No Has patient had a PCN reaction causing severe rash involving mucus membranes or skin necrosis: No Has patient had a PCN reaction that required hospitalization No Has patient had a PCN reaction occurring within the last 10 years: No If all of the above answers are "NO", then may proceed with Cephalosporin use.     Med Rec must be completed prior to using this [facility]***       Discharge Exam: Filed Weights   11/12/22 0127  Weight: 59 kg   HEENT:  [place]/AT, No thrush, no icterus CV:  RRR, no rub, no S3, no S4 Lung:  bibasilar rales.  No wheeze Abd:  soft/+BS, NT Ext:  No edema, no lymphangitis, no synovitis, no rash  Condition at discharge: stable  The results of significant diagnostics from this hospitalization (including imaging, microbiology, ancillary and laboratory) are listed below for reference.   Imaging Studies: DG Chest 2 View  Result Date: 11/12/2022 CLINICAL DATA:  Shortness of  breath.  Possible asthma exacerbation. EXAM: CHEST - 2 VIEW COMPARISON:  Portable chest 11/09/2022 . FINDINGS: The heart size and mediastinal contours are within normal limits. Both lungs are hyperexpanded, with interval increased right infrahilar haziness which could be due to atelectasis or a small pneumonia. The remaining lungs are clear. The visualized skeletal structures are unremarkable. IMPRESSION: Both lungs are hyperexpanded, with interval increased right infrahilar haziness which could be due to atelectasis or a small pneumonia. Follow-up as indicated. Electronically Signed   By: Almira Bar M.D.   On: 11/12/2022 01:56   MM DIAG BREAST TOMO UNI RIGHT  Result Date: 11/11/2022 CLINICAL DATA:  Recall from screening to evaluate 2 possible right breast asymmetries. EXAM: DIGITAL DIAGNOSTIC UNILATERAL RIGHT MAMMOGRAM WITH TOMOSYNTHESIS; ULTRASOUND RIGHT BREAST LIMITED TECHNIQUE: Right digital diagnostic mammography and breast tomosynthesis was performed.; Targeted ultrasound examination of the right breast was performed COMPARISON:  Recent screening mammogram 10/27/2022 ACR Breast Density Category b: There are scattered areas of fibroglandular density. FINDINGS: Additional spot compression tomographic images of the right breast were obtained. There is persistent, but slightly less apparent asymmetry over the upper-outer right breast in the middle third. There is also persistent, but less apparent asymmetry over the anterior third of the upper outer right periareolar region. These findings likely due to asymmetric fibroglandular tissue. Targeted ultrasound is performed, showing patchy pattern of fibroglandular tissue over the upper-outer right breast the 10 o'clock position 3 cm from the nipple and 11 o'clock position 6 cm from the nipple likely accounting for the screening mammographic findings. IMPRESSION: No concerning findings within the right breast as the possible screening asymmetries are due to  patchy asymmetric fibroglandular tissue. RECOMMENDATION: Recommend continued annual bilateral screening mammographic follow-up. I have discussed the findings and recommendations with the patient. If applicable, a reminder letter will be sent to the patient regarding the next appointment. BI-RADS CATEGORY  1: Negative. Electronically Signed   By: Elberta Fortis M.D.   On: 11/11/2022 12:56   US BREAST LTD UNI RIGHT INC AXILLA  Result Date: 11/11/2022 CLINICAL DATA:  Recall from screening to evaluate 2 possible right breast asymmetries. EXAM: DIGITAL DIAGNOSTIC UNILATERAL RIGHT MAMMOGRAM WITH TOMOSYNTHESIS; ULTRASOUND RIGHT BREAST LIMITED TECHNIQUE: Right digital diagnostic mammography and breast tomosynthesis was performed.; Targeted ultrasound examination of the right breast was performed COMPARISON:  Recent screening mammogram 10/27/2022 ACR Breast Density Category b: There are scattered areas of fibroglandular density. FINDINGS: Additional spot compression tomographic images of the right breast were obtained. There is persistent, but slightly less apparent asymmetry over the upper-outer right breast in the middle third. There is also persistent, but less apparent asymmetry over the anterior third of the upper outer right periareolar region. These findings likely due to asymmetric fibroglandular tissue. Targeted ultrasound is performed, showing patchy pattern of fibroglandular tissue over the upper-outer right breast the 10 o'clock position 3 cm from the nipple and 11 o'clock position 6 cm from the nipple likely accounting for the screening mammographic findings. IMPRESSION: No concerning findings within the right breast as the possible screening asymmetries are due to patchy asymmetric fibroglandular tissue. RECOMMENDATION: Recommend continued annual bilateral screening mammographic follow-up. I have discussed the findings and recommendations with the patient. If applicable, a reminder letter will be sent to  the patient regarding the next appointment. BI-RADS CATEGORY  1: Negative. Electronically Signed   By: Elberta Fortis M.D.   On: 11/11/2022 12:56   DG Chest Portable 1 View  Result Date: 11/09/2022 CLINICAL DATA:  Provided history: Dyspnea. EXAM: PORTABLE CHEST 1 VIEW COMPARISON:  Prior chest radiographs 12/20/2019 and earlier. FINDINGS: Heart size within normal limits. No appreciable airspace consolidation. A trace left pleural effusion is questioned. No evidence of pneumothorax. No acute bony abnormality identified. IMPRESSION: A trace left pleural effusion is questioned. Otherwise, there is no evidence of acute cardiopulmonary abnormality. Electronically Signed   By: Jackey Loge D.O.   On: 11/09/2022 08:27   EEG adult  Result Date: 10/27/2022 Van Clines, MD     11/01/2022  4:44 PM ELECTROENCEPHALOGRAM REPORT Date of Study: 10/27/2022 Patient's Name: MKAYLA STEELE MRN: [id number] Date of Birth: 02/28/1981 Referring Provider: Dr. Patrcia Dolly Clinical History: This is a 41 year old woman with convulsions and staring episodes with head/gaze turn to the left. EEG for classification. Medications: Keppra Prozac Technical Summary: A multichannel digital EEG recording measured by the international 10-20 system with electrodes applied with paste and impedances below 5000 ohms performed in our laboratory with EKG monitoring in an awake and asleep patient.  Hyperventilation and photic stimulation were performed.  The digital EEG was referentially recorded, reformatted, and digitally filtered in a variety of bipolar and referential montages for optimal display.  Description: The patient is awake and asleep during the recording.  During maximal wakefulness, there is a symmetric, medium voltage 10 Hz posterior dominant rhythm that attenuates with eye opening.  The record is symmetric.  During drowsiness and sleep, there is an increase in theta slowing of the background.  Vertex waves and symmetric sleep spindles  were seen. Hyperventilation and photic stimulation did not elicit any abnormalities.  There were no epileptiform discharges or electrographic seizures seen.  EKG lead was unremarkable. Impression: This  awake and asleep EEG is normal.  Clinical Correlation: A normal EEG does not exclude a clinical diagnosis of epilepsy.  If further clinical questions remain, prolonged EEG may be helpful.  Clinical correlation is advised. Patrcia Dolly, M.D.    Microbiology: Results for orders placed or performed during the hospital encounter of 11/12/22  Resp panel by RT-PCR (RSV, Flu A&B, Covid) Anterior Nasal Swab     Status: Abnormal   Collection Time: 11/12/22  1:28 AM   Specimen: Anterior Nasal Swab  Result Value Ref Range Status   SARS Coronavirus 2 by RT PCR NEGATIVE NEGATIVE Final    Comment: (NOTE) SARS-CoV-2 target nucleic acids are NOT DETECTED.  The SARS-CoV-2 RNA is generally detectable in upper respiratory specimens during the acute phase of infection. The lowest concentration of SARS-CoV-2 viral copies this assay can detect is 138 copies/mL. A negative result does not preclude SARS-Cov-2 infection and should not be used as the sole basis for treatment or other patient management decisions. A negative result may occur with  improper specimen collection/handling, submission of specimen other than nasopharyngeal swab, presence of viral mutation(s) within the areas targeted by this assay, and inadequate number of viral copies(<138 copies/mL). A negative result must be combined with clinical observations, patient history, and epidemiological information. The expected result is Negative.  Fact Sheet for Patients:  BloggerCourse.com  Fact Sheet for Healthcare Providers:  SeriousBroker.it  This test is no t yet approved or cleared by the [place] FDA and  has been authorized for detection and/or diagnosis of SARS-CoV-2 by  FDA under an  Emergency Use Authorization (EUA). This EUA will remain  in effect (meaning this test can be used) for the duration of the COVID-19 declaration under Section 564(b)(1) of the Act, 21 U.S.C.section 360bbb-3(b)(1), unless the authorization is terminated  or revoked sooner.       Influenza A by PCR NEGATIVE NEGATIVE Final   Influenza B by PCR NEGATIVE NEGATIVE Final    Comment: (NOTE) The Xpert Xpress SARS-CoV-2/FLU/RSV plus assay is intended as an aid in the diagnosis of influenza from Nasopharyngeal swab specimens and should not be used as a sole basis for treatment. Nasal washings and aspirates are unacceptable for Xpert Xpress SARS-CoV-2/FLU/RSV testing.  Fact Sheet for Patients: BloggerCourse.comhttps://www.fda.gov/media/152166/download  Fact Sheet for Healthcare Providers: SeriousBroker.ithttps://www.fda.gov/media/152162/download  This test is not yet approved or cleared by the [place] FDA and has been authorized for detection and/or diagnosis of SARS-CoV-2 by FDA under an Emergency Use Authorization (EUA). This EUA will remain in effect (meaning this test can be used) for the duration of the COVID-19 declaration under Section 564(b)(1) of the Act, 21 U.S.C. section 360bbb-3(b)(1), unless the authorization is terminated or revoked.     Resp Syncytial Virus by PCR POSITIVE (A) NEGATIVE Final    Comment: (NOTE) Fact Sheet for Patients: BloggerCourse.comhttps://www.fda.gov/media/152166/download  Fact Sheet for Healthcare Providers: SeriousBroker.ithttps://www.fda.gov/media/152162/download  This test is not yet approved or cleared by the [place] FDA and has been authorized for detection and/or diagnosis of SARS-CoV-2 by FDA under an Emergency Use Authorization (EUA). This EUA will remain in effect (meaning this test can be used) for the duration of the COVID-19 declaration under Section 564(b)(1) of the Act, 21 U.S.C. section 360bbb-3(b)(1), unless the authorization is terminated or revoked.  Performed at [facility], [address] [place], [place] [id number]     Labs: CBC: Recent Labs  Lab 11/09/22 0829 11/12/22 0259 11/12/22 0600  WBC 6.5 10.9* 11.3*  NEUTROABS 3.4 8.5*  --   HGB 14.8 14.0 13.1  HCT 43.7 42.9 40.5  MCV 98.0 99.8 101.3*  PLT 298 278 282   Basic Metabolic Panel: Recent Labs  Lab 11/09/22 0829 11/12/22 0259 11/12/22 0600  NA 135 136  --   K 3.5 3.5  --   CL 105 104  --   CO2 23 25  --   GLUCOSE 87 84  --   BUN 9 17  --   CREATININE 0.51 0.55 0.57  CALCIUM 8.6* 8.6*  --   MG  --  1.9  --   PHOS  --   --  2.3*   Liver Function Tests: Recent Labs  Lab 11/09/22 0829  AST 14*  ALT 10  ALKPHOS 65  BILITOT 0.4  PROT 7.3  ALBUMIN 3.8   CBG: No results for input(s): "GLUCAP" in the last 168 hours.  Discharge time spent: greater than 30 minutes.  Signed: Catarina Hartshornavid Brailynn Breth, MD [facility] 11/12/2022

## 2022-11-12 NOTE — ED Notes (Signed)
RT called for neb treatments.

## 2022-11-12 NOTE — H&P (Addendum)
History and Physical    Patient: Kelsey [name] BZJ:[id number] DOB: 11/04/1981 DOA: 11/12/2022 DOS: the patient was seen and examined on 11/12/2022 PCP: Anabel Halon, MD  Patient coming from: Home  Chief Complaint:  Chief Complaint  Patient presents with   Shortness of Breath   HPI: Kelsey [name] is a 41 y.o. female with medical history significant of hypertension, asthma, seizure who presents to the emergency department from work due to shortness of breath and concern for asthma flareup.  Patient complaining of 1 week onset of cold symptoms with productive cough of yellowish sputum, wheezing and she came to the ED on 12/14 and was treated with breathing treatment, magnesium and Solu-Medrol 125 mg, patient states that her symptoms improved for about 1.5 days, after which she started to have increasing shortness of breath and wheezing again.  Patient states that she was exposed to her stepson who had cold symptoms.  She decided to return to the ED this morning due to worsening shortness of breath and wheezing.  She denies fever, headache, blurry vision, nausea, vomiting, abdominal pain diarrhea or constipation.  ED Course:  In the emergency department, BP was elevated at 186/113, but other vital signs were within normal range.  Workup in the ED showed normal CBC except for leukocytosis with a left shift.  BMP was normal, magnesium 1.9, but RSV was positive. Chest x-ray showed both lungs are hyperexpanded, with interval increased right pneumonia.  Digital diagnostic unilateral right mammogram with tomosynthesis; ultrasound right breast limited showed no concerning findings within the right breast as a possible screening asymmetry site due to patchy asymmetric fibroglandular tissue. Doxycycline was initially given due to presumed pneumonia.  Breathing treatment was provided, magnesium was given, Solu-Medrol was given.  Hospitalist was asked to admit patient for further evaluation and  management.  Review of Systems: Review of systems as noted in the HPI. All other systems reviewed and are negative.   Past Medical History:  Diagnosis Date   Anxiety    Asthma    hosp. 2018 and 2019 no h/o intubation    Depression    GERD (gastroesophageal reflux disease)    Hypertension    Palpitations    Perianal abscess 11/22/2016   12/07/2016.  [facility], [place].  Ovidio Kin, MD.  Perianal abscess is much better but still needs local wound care.  Continue sitz baths 2-3 times perday until all drainage and pain are gone.  Follow up as needed.   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 She has a 2.50 pack-year smoking history. She has never used smokeless tobacco. She reports current alcohol use of about 4.0 standard drinks of alcohol per week. She reports that she does not currently use drugs after having used the following drugs: Marijuana.   Allergies  Allergen Reactions    Penicillins Rash    Has patient had a PCN reaction causing immediate rash, facial/tongue/throat swelling, SOB or lightheadedness with hypotension: No Has patient had a PCN reaction causing severe rash involving mucus membranes or skin necrosis: No Has patient had a PCN reaction that required hospitalization No Has patient had a PCN reaction occurring within the last 10 years: No If all of the above answers are "NO", then may proceed with Cephalosporin use.    Family History  Problem Relation Age of Onset   Rheumatologic disease Mother    Arthritis Mother    Asthma Mother    Depression Mother    Heart disease Father    Asthma Maternal Grandmother    Hypertension Maternal Grandmother    Heart disease Maternal Grandfather    Lung cancer Paternal 40    Healthy Daughter    Healthy Son      Prior to Admission medications   Medication Sig Start Date End Date Taking? Authorizing Provider  albuterol (VENTOLIN HFA) 108 (90 Base) MCG/ACT inhaler Inhale 1-2 puffs into the lungs every 6 (six) hours as needed for wheezing or shortness of breath. 08/28/22   Lindell Spar, MD  FLUoxetine (PROZAC) 40 MG capsule Take 2 capsules (80 mg total) by mouth daily. Patient taking differently: Take 60 mg by mouth daily. 08/28/22   Lindell Spar, MD  levETIRAcetam (KEPPRA) 500 MG tablet Take 1 tablet (500 mg total) by mouth 2 (two) times daily. Patient not taking: Reported on 11/09/2022 10/04/22   Cameron Sprang, MD  levonorgestrel [facility]) 20 MCG/24HR IUD 1 Intra Uterine Device (1 each total) by Intrauterine route once. Patient not taking: Reported on 11/09/2022 07/16/15   Kerrie Buffalo, NP  predniSONE (DELTASONE) 10 MG tablet Take 4 tablets (40 mg total) by mouth daily. 11/10/22   Kommor, Debe Coder, MD    Physical Exam: BP 120/74   Pulse (!) 133   Temp 97.6 F (36.4 C) (Oral)   Resp 20   Ht 5\' 4"  (1.626 m)   Wt 59 kg   SpO2 92%   BMI 22.32 kg/m   General: 41 y.o. year-old female well  developed well nourished in no acute distress.  Alert and oriented x3. HEENT: NCAT, EOMI Neck: Supple, trachea medial Cardiovascular: Regular rate and rhythm with no rubs or gallops.  No thyromegaly or JVD noted.  No lower extremity edema. 2/4 pulses in all 4 extremities. Respiratory: Diffuse expiratory wheezing on auscultation.  No rales.  Abdomen: Soft, nontender nondistended with normal bowel sounds x4 quadrants. Muskuloskeletal: No cyanosis, clubbing or edema noted bilaterally Neuro: CN II-XII intact, strength 5/5 x 4, sensation, reflexes intact Skin: No ulcerative lesions noted or rashes Psychiatry: Judgement and insight appear normal. Mood is appropriate for condition and setting          Labs on Admission:  Basic Metabolic Panel: Recent Labs  Lab 11/09/22 0829 11/12/22 0259  NA 135 136  K 3.5 3.5  CL 105 104  CO2 23 25  GLUCOSE 87 84  BUN 9 17  CREATININE 0.51 0.55  CALCIUM 8.6* 8.6*  MG  --  1.9   Liver Function Tests: Recent Labs  Lab 11/09/22  0829  AST 14*  ALT 10  ALKPHOS 65  BILITOT 0.4  PROT 7.3  ALBUMIN 3.8   No results for input(s): "LIPASE", "AMYLASE" in the last 168 hours. No results for input(s): "AMMONIA" in the last 168 hours. CBC: Recent Labs  Lab 11/09/22 0829 11/12/22 0259  WBC 6.5 10.9*  NEUTROABS 3.4 8.5*  HGB 14.8 14.0  HCT 43.7 42.9  MCV 98.0 99.8  PLT 298 278   Cardiac Enzymes: No results for input(s): "CKTOTAL", "CKMB", "CKMBINDEX", "TROPONINI" in the last 168 hours.  BNP (last 3 results) Recent Labs    11/09/22 0829  BNP 67.0    ProBNP (last 3 results) No results for input(s): "PROBNP" in the last 8760 hours.  CBG: No results for input(s): "GLUCAP" in the last 168 hours.  Radiological Exams on Admission: DG Chest 2 View  Result Date: 11/12/2022 CLINICAL DATA:  Shortness of breath.  Possible asthma exacerbation. EXAM: CHEST - 2 VIEW COMPARISON:  Portable chest 11/09/2022 . FINDINGS: The heart size and mediastinal  contours are within normal limits. Both lungs are hyperexpanded, with interval increased right infrahilar haziness which could be due to atelectasis or a small pneumonia. The remaining lungs are clear. The visualized skeletal structures are unremarkable. IMPRESSION: Both lungs are hyperexpanded, with interval increased right infrahilar haziness which could be due to atelectasis or a small pneumonia. Follow-up as indicated. Electronically Signed   By: Telford Nab M.D.   On: 11/12/2022 01:56   MM DIAG BREAST TOMO UNI RIGHT  Result Date: 11/11/2022 CLINICAL DATA:  Recall from screening to evaluate 2 possible right breast asymmetries. EXAM: DIGITAL DIAGNOSTIC UNILATERAL RIGHT MAMMOGRAM WITH TOMOSYNTHESIS; ULTRASOUND RIGHT BREAST LIMITED TECHNIQUE: Right digital diagnostic mammography and breast tomosynthesis was performed.; Targeted ultrasound examination of the right breast was performed COMPARISON:  Recent screening mammogram 10/27/2022 ACR Breast Density Category b: There are scattered areas of fibroglandular density. FINDINGS: Additional spot compression tomographic images of the right breast were obtained. There is persistent, but slightly less apparent asymmetry over the upper-outer right breast in the middle third. There is also persistent, but less apparent asymmetry over the anterior third of the upper outer right periareolar region. These findings likely due to asymmetric fibroglandular tissue. Targeted ultrasound is performed, showing patchy pattern of fibroglandular tissue over the upper-outer right breast the 10 o'clock position 3 cm from the nipple and 11 o'clock position 6 cm from the nipple likely accounting for the screening mammographic findings. IMPRESSION: No concerning findings within the right breast as the possible screening asymmetries are due to patchy asymmetric fibroglandular tissue. RECOMMENDATION: Recommend continued annual bilateral screening mammographic follow-up. I have discussed  the findings and recommendations with the patient. If applicable, a reminder letter will be sent to the patient regarding the next appointment. BI-RADS CATEGORY  1: Negative. Electronically Signed   By: Marin Olp M.D.   On: 11/11/2022 12:56   US BREAST LTD UNI RIGHT INC AXILLA  Result Date: 11/11/2022 CLINICAL DATA:  Recall from screening to evaluate 2 possible right breast asymmetries. EXAM: DIGITAL DIAGNOSTIC UNILATERAL RIGHT MAMMOGRAM WITH TOMOSYNTHESIS; ULTRASOUND RIGHT BREAST LIMITED TECHNIQUE: Right digital diagnostic mammography and breast tomosynthesis was performed.; Targeted ultrasound examination of the right breast was performed COMPARISON:  Recent screening mammogram 10/27/2022 ACR Breast Density Category b: There are scattered areas of fibroglandular density. FINDINGS: Additional spot compression tomographic images of the right breast were obtained. There is persistent, but slightly less apparent asymmetry over the upper-outer right breast in the middle third. There is  also persistent, but less apparent asymmetry over the anterior third of the upper outer right periareolar region. These findings likely due to asymmetric fibroglandular tissue. Targeted ultrasound is performed, showing patchy pattern of fibroglandular tissue over the upper-outer right breast the 10 o'clock position 3 cm from the nipple and 11 o'clock position 6 cm from the nipple likely accounting for the screening mammographic findings. IMPRESSION: No concerning findings within the right breast as the possible screening asymmetries are due to patchy asymmetric fibroglandular tissue. RECOMMENDATION: Recommend continued annual bilateral screening mammographic follow-up. I have discussed the findings and recommendations with the patient. If applicable, a reminder letter will be sent to the patient regarding the next appointment. BI-RADS CATEGORY  1: Negative. Electronically Signed   By: Marin Olp M.D.   On: 11/11/2022 12:56     EKG: I independently viewed the EKG done and my findings are as followed: Normal sinus rhythm at a rate of 84 bpm  Assessment/Plan Present on Admission:  Acute respiratory failure with hypoxia ([place])  Acute asthma exacerbation  Depression  Tobacco abuse  Essential hypertension  Principal Problem:   Acute respiratory failure with hypoxia (HCC) Active Problems:   Depression   Acute asthma exacerbation   Essential hypertension   Tobacco abuse   Seizures (HCC)   RSV (respiratory syncytial virus infection)   Noncompliance with medication regimen   Acute respiratory failure with hypoxia possibly due to RSV associated bronchial reactivity with superimposed acute exacerbation of asthma Continue duo nebs, Mucinex, Solu-Medrol, azithromycin. Continue Protonix to prevent steroid-induced ulcer Continue incentive spirometry and flutter valve Continue supplemental oxygen to maintain O2 sat > 92% with plan to wean patient off oxygen as tolerated  Seizures Continue Keppra  Depression Continue Prozac  Essential hypertension Continue amlodipine 5 mg p.o. daily Continue IV hydralazine 10 mg every 6 hours as needed for SBP > 170  Tobacco abuse Patient was counseled on tobacco abuse cessation  Noncompliance with medication regimen No antihypertensive medication noted in patient's med rec.  She states that she has not been taking any medication for this Patient was advised to be compliant with medication regimen.  DVT prophylaxis: Lovenox  Code Status: Full code  Family Communication: None at bedside  Consults: None  Severity of Illness: The appropriate patient status for this patient is OBSERVATION. Observation status is judged to be reasonable and necessary in order to provide the required intensity of service to ensure the patient's safety. The patient's presenting symptoms, physical exam findings, and initial radiographic and laboratory data in the context of their medical  condition is felt to place them at decreased risk for further clinical deterioration. Furthermore, it is anticipated that the patient will be medically stable for discharge from the hospital within 2 midnights of admission.   Author: Bernadette Hoit, DO 11/12/2022 7:08 AM  For on call review [email or web address].

## 2022-11-12 NOTE — Progress Notes (Signed)
PROGRESS NOTE  DEZIRAE SERVICE ATF:[id number] DOB: [birth year] DOA: 11/12/2022 PCP: Anabel Halon, MD  Brief History:  41 year old female with a history of hypertension, hyperlipidemia, asthma, seizure disorder, anxiety, tobacco abuse, polysubstance abuse (alcohol, benzodiazepines, THC) in remission presenting with nearly 10-day history of shortness of breath and nonproductive cough.  The patient visited the ED on 11/09/2022.  She was given nebulizer treatments, Solu-Medrol, magnesium with improvement, and she was sent home in stable condition with albuterol inhaler and prednisone 40 mg daily.  Unfortunately, she continued to have shortness of breath and coughing.  This worsened on 11/11/2022.  As result she presented for further evaluation and treatment.  She denies any fevers, chills, chest pain, hemoptysis, nausea, vomiting, diarrhea, abdominal pain.  She states that her stepson has also been sick with a respiratory illness. In the ED, the patient was afebrile and hemodynamically stable with oxygen saturation 87% room air.  RSV was positive.  Chest x-ray showed hyperacute inflation with right lower lobe haziness.  WBC 10.9, hemoglobin 14.0, platelets 270,000.  Sodium 136, potassium 3.5, bicarbonate 25, serum creatinine 0.55.  EKG shows sinus tachycardia with nonspecific T wave changes.  The patient was given an hour-long nebulizer treatment and started on intravenous Solu-Medrol.  She was admitted for further evaluation and treatment.  Assessment/Plan: Acute Respiratory Failure with Hypoxia -due to Asthma exacerbation in setting of RSV -Presented with tachypnea and oxygen saturation 87% on room air -Stable 2 L -Wean oxygen for saturation greater 92%  Acute asthma exacerbation -She is not on any maintenance medications at home -Start budesonide -Start Xopenex in the setting of tachycardia with albuterol -Continue IV Solu-Medrol  RSV bronchitis -Symptomatic treatment as  above  Tobacco abuse -She has a 10-15-pack-year history -Tobacco cessation discussed  Seizure disorder -Follows Dr. Patrcia Dolly -Restart Keppra  Anxiety -Continue fluoxetine  THC use -Risks and benefits discussed        Family Communication:  no Family at bedside  Consultants:  none  Code Status:  FULL   DVT Prophylaxis:  Herman Lovenox   Procedures: As Listed in Progress Note Above  Antibiotics: Azithro  12/17>>      Subjective: She states that her breathing is 25% better.  She continues to have a nonproductive cough.  She denies any fever, chills, chest pain, hemoptysis, nausea, vomiting, diarrhea, abdominal pain.  Objective: Vitals:   11/12/22 0500 11/12/22 0530 11/12/22 0541 11/12/22 0606  BP: 101/73 120/74    Pulse: (!) 136 (!) 133    Resp: 15 20    Temp:    97.6 F (36.4 C)  TempSrc:    Oral  SpO2: (!) 87% 91% 92%   Weight:      Height:        Intake/Output Summary (Last 24 hours) at 11/12/2022 0718 Last data filed at 11/12/2022 0403 Gross per 24 hour  Intake 45.25 ml  Output --  Net 45.25 ml   Weight change:  Exam:  General:  Pt is alert, follows commands appropriately, not in acute distress HEENT: No icterus, No thrush, No neck mass, [place]/AT Cardiovascular: RRR, S1/S2, no rubs, no gallops Respiratory: Bibasilar rales.  Bibasilar wheezing.  Good air movement Abdomen: Soft/+BS, non tender, non distended, no guarding Extremities: No edema, No lymphangitis, No petechiae, No rashes, no synovitis   Data Reviewed: I have personally reviewed following labs and imaging studies Basic Metabolic Panel: Recent Labs  Lab 11/09/22 0829 11/12/22 0259  NA  135 136  K 3.5 3.5  CL 105 104  CO2 23 25  GLUCOSE 87 84  BUN 9 17  CREATININE 0.51 0.55  CALCIUM 8.6* 8.6*  MG  --  1.9   Liver Function Tests: Recent Labs  Lab 11/09/22 0829  AST 14*  ALT 10  ALKPHOS 65  BILITOT 0.4  PROT 7.3  ALBUMIN 3.8   No results for input(s): "LIPASE",  "AMYLASE" in the last 168 hours. No results for input(s): "AMMONIA" in the last 168 hours. Coagulation Profile: No results for input(s): "INR", "PROTIME" in the last 168 hours. CBC: Recent Labs  Lab 11/09/22 0829 11/12/22 0259  WBC 6.5 10.9*  NEUTROABS 3.4 8.5*  HGB 14.8 14.0  HCT 43.7 42.9  MCV 98.0 99.8  PLT 298 278   Cardiac Enzymes: No results for input(s): "CKTOTAL", "CKMB", "CKMBINDEX", "TROPONINI" in the last 168 hours. BNP: Invalid input(s): "POCBNP" CBG: No results for input(s): "GLUCAP" in the last 168 hours. HbA1C: No results for input(s): "HGBA1C" in the last 72 hours. Urine analysis:    Component Value Date/Time   COLORURINE AMBER (A) 06/29/2022 0736   APPEARANCEUR CLOUDY (A) 06/29/2022 0736   LABSPEC 1.040 (H) 06/29/2022 0736   PHURINE 5.0 06/29/2022 0736   GLUCOSEU NEGATIVE 06/29/2022 0736   HGBUR NEGATIVE 06/29/2022 0736   BILIRUBINUR NEGATIVE 06/29/2022 0736   KETONESUR 5 (A) 06/29/2022 0736   PROTEINUR 100 (A) 06/29/2022 0736   UROBILINOGEN 0.2 01/17/2015 0851   NITRITE NEGATIVE 06/29/2022 0736   LEUKOCYTESUR MODERATE (A) 06/29/2022 0736   Sepsis Labs: @LABRCNTIP (procalcitonin:4,lacticidven:4) ) Recent Results (from the past 240 hour(s))  Resp panel by RT-PCR (RSV, Flu A&B, Covid) Anterior Nasal Swab     Status: Abnormal   Collection Time: 11/12/22  1:28 AM   Specimen: Anterior Nasal Swab  Result Value Ref Range Status   SARS Coronavirus 2 by RT PCR NEGATIVE NEGATIVE Final    Comment: (NOTE) SARS-CoV-2 target nucleic acids are NOT DETECTED.  The SARS-CoV-2 RNA is generally detectable in upper respiratory specimens during the acute phase of infection. The lowest concentration of SARS-CoV-2 viral copies this assay can detect is 138 copies/mL. A negative result does not preclude SARS-Cov-2 infection and should not be used as the sole basis for treatment or other patient management decisions. A negative result may occur with  improper specimen  collection/handling, submission of specimen other than nasopharyngeal swab, presence of viral mutation(s) within the areas targeted by this assay, and inadequate number of viral copies(<138 copies/mL). A negative result must be combined with clinical observations, patient history, and epidemiological information. The expected result is Negative.  Fact Sheet for Patients:  11/14/22  Fact Sheet for Healthcare Providers:  BloggerCourse.com  This test is no t yet approved or cleared by the [place] FDA and  has been authorized for detection and/or diagnosis of SARS-CoV-2 by FDA under an Emergency Use Authorization (EUA). This EUA will remain  in effect (meaning this test can be used) for the duration of the COVID-19 declaration under Section 564(b)(1) of the Act, 21 U.S.C.section 360bbb-3(b)(1), unless the authorization is terminated  or revoked sooner.       Influenza A by PCR NEGATIVE NEGATIVE Final   Influenza B by PCR NEGATIVE NEGATIVE Final    Comment: (NOTE) The Xpert Xpress SARS-CoV-2/FLU/RSV plus assay is intended as an aid in the diagnosis of influenza from Nasopharyngeal swab specimens and should not be used as a sole basis for treatment. Nasal washings and aspirates are unacceptable for  Xpert Xpress SARS-CoV-2/FLU/RSV testing.  Fact Sheet for Patients: BloggerCourse.com  Fact Sheet for Healthcare Providers: [employer]ious[employer]it  This test is not yet approved or cleared by the [place] FDA and has been authorized for detection and/or diagnosis of SARS-CoV-2 by FDA under an Emergency Use Authorization (EUA). This EUA will remain in effect (meaning this test can be used) for the duration of the COVID-19 declaration under Section 564(b)(1) of the Act, 21 U.S.C. section 360bbb-3(b)(1), unless the authorization is terminated or revoked.     Resp Syncytial  Virus by PCR POSITIVE (A) NEGATIVE Final    Comment: (NOTE) Fact Sheet for Patients: BloggerCourse.com  Fact Sheet for Healthcare Providers: [employer]it  This test is not yet approved or cleared by the [place] FDA and has been authorized for detection and/or diagnosis of SARS-CoV-2 by FDA under an Emergency Use Authorization (EUA). This EUA will remain in effect (meaning this test can be used) for the duration of the COVID-19 declaration under Section 564(b)(1) of the Act, 21 U.S.C. section 360bbb-3(b)(1), unless the authorization is terminated or revoked.  Performed at [facility], [address], [place], [place] [address]      Scheduled Meds:  azithromycin  500 mg Oral Daily   Followed by   Melene Muller ON 11/13/2022] azithromycin  250 mg Oral Daily   budesonide (PULMICORT) nebulizer solution  0.5 mg Nebulization BID   dextromethorphan-guaiFENesin  1 tablet Oral BID   enoxaparin (LOVENOX) injection  40 mg Subcutaneous Q24H   FLUoxetine  60 mg Oral Daily   ipratropium  0.5 mg Nebulization Q6H   levalbuterol  0.63 mg Nebulization Q6H   levETIRAcetam  500 mg Oral BID   methylPREDNISolone (SOLU-MEDROL) injection  40 mg Intravenous Q12H   pantoprazole  40 mg Oral Daily   Continuous Infusions:  Procedures/Studies: DG Chest 2 View  Result Date: 11/12/2022 CLINICAL DATA:  Shortness of breath.  Possible asthma exacerbation. EXAM: CHEST - 2 VIEW COMPARISON:  Portable chest 11/09/2022 . FINDINGS: The heart size and mediastinal contours are within normal limits. Both lungs are hyperexpanded, with interval increased right infrahilar haziness which could be due to atelectasis or a small pneumonia. The remaining lungs are clear. The visualized skeletal structures are unremarkable. IMPRESSION: Both lungs are hyperexpanded, with interval increased right infrahilar haziness which could be due to atelectasis or a small pneumonia. Follow-up  as indicated. Electronically Signed   By: Almira Bar M.D.   On: 11/12/2022 01:56   MM DIAG BREAST TOMO UNI RIGHT  Result Date: 11/11/2022 CLINICAL DATA:  Recall from screening to evaluate 2 possible right breast asymmetries. EXAM: DIGITAL DIAGNOSTIC UNILATERAL RIGHT MAMMOGRAM WITH TOMOSYNTHESIS; ULTRASOUND RIGHT BREAST LIMITED TECHNIQUE: Right digital diagnostic mammography and breast tomosynthesis was performed.; Targeted ultrasound examination of the right breast was performed COMPARISON:  Recent screening mammogram 10/27/2022 ACR Breast Density Category b: There are scattered areas of fibroglandular density. FINDINGS: Additional spot compression tomographic images of the right breast were obtained. There is persistent, but slightly less apparent asymmetry over the upper-outer right breast in the middle third. There is also persistent, but less apparent asymmetry over the anterior third of the upper outer right periareolar region. These findings likely due to asymmetric fibroglandular tissue. Targeted ultrasound is performed, showing patchy pattern of fibroglandular tissue over the upper-outer right breast the 10 o'clock position 3 cm from the nipple and 11 o'clock position 6 cm from the nipple likely accounting for the screening mammographic findings. IMPRESSION: No concerning findings within the right breast as the  possible screening asymmetries are due to patchy asymmetric fibroglandular tissue. RECOMMENDATION: Recommend continued annual bilateral screening mammographic follow-up. I have discussed the findings and recommendations with the patient. If applicable, a reminder letter will be sent to the patient regarding the next appointment. BI-RADS CATEGORY  1: Negative. Electronically Signed   By: Elberta Fortis M.D.   On: 11/11/2022 12:56   US BREAST LTD UNI RIGHT INC AXILLA  Result Date: 11/11/2022 CLINICAL DATA:  Recall from screening to evaluate 2 possible right breast asymmetries. EXAM: DIGITAL  DIAGNOSTIC UNILATERAL RIGHT MAMMOGRAM WITH TOMOSYNTHESIS; ULTRASOUND RIGHT BREAST LIMITED TECHNIQUE: Right digital diagnostic mammography and breast tomosynthesis was performed.; Targeted ultrasound examination of the right breast was performed COMPARISON:  Recent screening mammogram 10/27/2022 ACR Breast Density Category b: There are scattered areas of fibroglandular density. FINDINGS: Additional spot compression tomographic images of the right breast were obtained. There is persistent, but slightly less apparent asymmetry over the upper-outer right breast in the middle third. There is also persistent, but less apparent asymmetry over the anterior third of the upper outer right periareolar region. These findings likely due to asymmetric fibroglandular tissue. Targeted ultrasound is performed, showing patchy pattern of fibroglandular tissue over the upper-outer right breast the 10 o'clock position 3 cm from the nipple and 11 o'clock position 6 cm from the nipple likely accounting for the screening mammographic findings. IMPRESSION: No concerning findings within the right breast as the possible screening asymmetries are due to patchy asymmetric fibroglandular tissue. RECOMMENDATION: Recommend continued annual bilateral screening mammographic follow-up. I have discussed the findings and recommendations with the patient. If applicable, a reminder letter will be sent to the patient regarding the next appointment. BI-RADS CATEGORY  1: Negative. Electronically Signed   By: Elberta Fortis M.D.   On: 11/11/2022 12:56   DG Chest Portable 1 View  Result Date: 11/09/2022 CLINICAL DATA:  Provided history: Dyspnea. EXAM: PORTABLE CHEST 1 VIEW COMPARISON:  Prior chest radiographs 12/20/2019 and earlier. FINDINGS: Heart size within normal limits. No appreciable airspace consolidation. A trace left pleural effusion is questioned. No evidence of pneumothorax. No acute bony abnormality identified. IMPRESSION: A trace left pleural  effusion is questioned. Otherwise, there is no evidence of acute cardiopulmonary abnormality. Electronically Signed   By: Jackey Loge D.O.   On: 11/09/2022 08:27   EEG adult  Result Date: 10/27/2022 Van Clines, MD     11/01/2022  4:44 PM ELECTROENCEPHALOGRAM REPORT Date of Study: 10/27/2022 Patient's Name: JAYLI FOGLEMAN MRN: [id number] Date of Birth: 11-Dec-1980 Referring Provider: Dr. Patrcia Dolly Clinical History: This is a 41 year old woman with convulsions and staring episodes with head/gaze turn to the left. EEG for classification. Medications: Keppra Prozac Technical Summary: A multichannel digital EEG recording measured by the international 10-20 system with electrodes applied with paste and impedances below 5000 ohms performed in our laboratory with EKG monitoring in an awake and asleep patient.  Hyperventilation and photic stimulation were performed.  The digital EEG was referentially recorded, reformatted, and digitally filtered in a variety of bipolar and referential montages for optimal display.  Description: The patient is awake and asleep during the recording.  During maximal wakefulness, there is a symmetric, medium voltage 10 Hz posterior dominant rhythm that attenuates with eye opening.  The record is symmetric.  During drowsiness and sleep, there is an increase in theta slowing of the background.  Vertex waves and symmetric sleep spindles were seen. Hyperventilation and photic stimulation did not elicit any abnormalities.  There were no  epileptiform discharges or electrographic seizures seen.  EKG lead was unremarkable. Impression: This  awake and asleep EEG is normal.  Clinical Correlation: A normal EEG does not exclude a clinical diagnosis of epilepsy.  If further clinical questions remain, prolonged EEG may be helpful.  Clinical correlation is advised. Patrcia DollyKaren Aquino, M.D.    Catarina Hartshornavid Dazani Norby, DO  [facility]  If 7PM-7AM, please contact night-coverage [email or web address] Password  [email or web address] 11/12/2022, 7:18 AM   LOS: 0 days

## 2022-11-12 NOTE — ED Provider Notes (Signed)
[facility] EMERGENCY DEPARTMENT Provider Note   CSN: [id number] Arrival date & time: 11/12/22  0055     History  Chief Complaint  Patient presents with   Shortness of Breath    Kelsey [name] is a 41 y.o. female.  41 year old female with asthma the presents the ER today mild respiratory distress secondary to asthma exacerbation.  Patient was here couple days ago with similar symptoms significant amount of albuterol with what sounds like quite a bit of improvement.  She has been using her albuterol at home and steroids last couple days but tonight she states that her breathing got much worse she has significant issues with a cough and an tachypnea.  She works as a Buyer, retail at [employer].  She states she got a couple DuoNebs at work and was not feeling better so she came here for further evaluation.  No fevers.  Still productive cough.   Shortness of Breath      Home Medications Prior to Admission medications   Medication Sig Start Date End Date Taking? Authorizing Provider  albuterol (VENTOLIN HFA) 108 (90 Base) MCG/ACT inhaler Inhale 1-2 puffs into the lungs every 6 (six) hours as needed for wheezing or shortness of breath. 08/28/22   Anabel Halon, MD  FLUoxetine (PROZAC) 40 MG capsule Take 2 capsules (80 mg total) by mouth daily. Patient taking differently: Take 60 mg by mouth daily. 08/28/22   Anabel Halon, MD  levETIRAcetam (KEPPRA) 500 MG tablet Take 1 tablet (500 mg total) by mouth 2 (two) times daily. Patient not taking: Reported on 11/09/2022 10/04/22   Van Clines, MD  levonorgestrel [facility]) 20 MCG/24HR IUD 1 Intra Uterine Device (1 each total) by Intrauterine route once. Patient not taking: Reported on 11/09/2022 07/16/15   Adonis Brook, NP  predniSONE (DELTASONE) 10 MG tablet Take 4 tablets (40 mg total) by mouth daily. 11/10/22   Kommor, Wyn Forster, MD      Allergies    Penicillins    Review of Systems   Review of Systems  Respiratory:   Positive for shortness of breath.     Physical Exam Updated Vital Signs BP 120/74   Pulse (!) 133   Temp 97.6 F (36.4 C) (Oral)   Resp 20   Ht 5\' 4"  (1.626 m)   Wt 59 kg   SpO2 92%   BMI 22.32 kg/m  Physical Exam Vitals and nursing note reviewed.  Constitutional:      Appearance: She is well-developed.  HENT:     Head: Normocephalic and atraumatic.  Cardiovascular:     Rate and Rhythm: Normal rate and regular rhythm.  Pulmonary:     Effort: Tachypnea, accessory muscle usage and respiratory distress present.     Breath sounds: No stridor. Decreased breath sounds and wheezing present.  Abdominal:     General: There is no distension.  Musculoskeletal:        General: Normal range of motion.     Cervical back: Normal range of motion.     Right lower leg: No edema.     Left lower leg: No edema.  Skin:    General: Skin is warm and dry.  Neurological:     General: No focal deficit present.     Mental Status: She is alert.     ED Results / Procedures / Treatments   Labs (all labs ordered are listed, but only abnormal results are displayed) Labs Reviewed  RESP PANEL BY RT-PCR (RSV, FLU A&B, COVID)  RVPGX2 - Abnormal; Notable for the following components:      Result Value   Resp Syncytial Virus by PCR POSITIVE (*)    All other components within normal limits  CBC WITH DIFFERENTIAL/PLATELET - Abnormal; Notable for the following components:   WBC 10.9 (*)    Neutro Abs 8.5 (*)    All other components within normal limits  BASIC METABOLIC PANEL - Abnormal; Notable for the following components:   Calcium 8.6 (*)    All other components within normal limits  MAGNESIUM    EKG None  Radiology DG Chest 2 View  Result Date: 11/12/2022 CLINICAL DATA:  Shortness of breath.  Possible asthma exacerbation. EXAM: CHEST - 2 VIEW COMPARISON:  Portable chest 11/09/2022 . FINDINGS: The heart size and mediastinal contours are within normal limits. Both lungs are hyperexpanded,  with interval increased right infrahilar haziness which could be due to atelectasis or a small pneumonia. The remaining lungs are clear. The visualized skeletal structures are unremarkable. IMPRESSION: Both lungs are hyperexpanded, with interval increased right infrahilar haziness which could be due to atelectasis or a small pneumonia. Follow-up as indicated. Electronically Signed   By: Almira BarKeith  Chesser M.D.   On: 11/12/2022 01:56   MM DIAG BREAST TOMO UNI RIGHT  Result Date: 11/11/2022 CLINICAL DATA:  Recall from screening to evaluate 2 possible right breast asymmetries. EXAM: DIGITAL DIAGNOSTIC UNILATERAL RIGHT MAMMOGRAM WITH TOMOSYNTHESIS; ULTRASOUND RIGHT BREAST LIMITED TECHNIQUE: Right digital diagnostic mammography and breast tomosynthesis was performed.; Targeted ultrasound examination of the right breast was performed COMPARISON:  Recent screening mammogram 10/27/2022 ACR Breast Density Category b: There are scattered areas of fibroglandular density. FINDINGS: Additional spot compression tomographic images of the right breast were obtained. There is persistent, but slightly less apparent asymmetry over the upper-outer right breast in the middle third. There is also persistent, but less apparent asymmetry over the anterior third of the upper outer right periareolar region. These findings likely due to asymmetric fibroglandular tissue. Targeted ultrasound is performed, showing patchy pattern of fibroglandular tissue over the upper-outer right breast the 10 o'clock position 3 cm from the nipple and 11 o'clock position 6 cm from the nipple likely accounting for the screening mammographic findings. IMPRESSION: No concerning findings within the right breast as the possible screening asymmetries are due to patchy asymmetric fibroglandular tissue. RECOMMENDATION: Recommend continued annual bilateral screening mammographic follow-up. I have discussed the findings and recommendations with the patient. If  applicable, a reminder letter will be sent to the patient regarding the next appointment. BI-RADS CATEGORY  1: Negative. Electronically Signed   By: Elberta Fortisaniel  Boyle M.D.   On: 11/11/2022 12:56   US BREAST LTD UNI RIGHT INC AXILLA  Result Date: 11/11/2022 CLINICAL DATA:  Recall from screening to evaluate 2 possible right breast asymmetries. EXAM: DIGITAL DIAGNOSTIC UNILATERAL RIGHT MAMMOGRAM WITH TOMOSYNTHESIS; ULTRASOUND RIGHT BREAST LIMITED TECHNIQUE: Right digital diagnostic mammography and breast tomosynthesis was performed.; Targeted ultrasound examination of the right breast was performed COMPARISON:  Recent screening mammogram 10/27/2022 ACR Breast Density Category b: There are scattered areas of fibroglandular density. FINDINGS: Additional spot compression tomographic images of the right breast were obtained. There is persistent, but slightly less apparent asymmetry over the upper-outer right breast in the middle third. There is also persistent, but less apparent asymmetry over the anterior third of the upper outer right periareolar region. These findings likely due to asymmetric fibroglandular tissue. Targeted ultrasound is performed, showing patchy pattern of fibroglandular tissue over the upper-outer right breast the 10  o'clock position 3 cm from the nipple and 11 o'clock position 6 cm from the nipple likely accounting for the screening mammographic findings. IMPRESSION: No concerning findings within the right breast as the possible screening asymmetries are due to patchy asymmetric fibroglandular tissue. RECOMMENDATION: Recommend continued annual bilateral screening mammographic follow-up. I have discussed the findings and recommendations with the patient. If applicable, a reminder letter will be sent to the patient regarding the next appointment. BI-RADS CATEGORY  1: Negative. Electronically Signed   By: Elberta Fortis M.D.   On: 11/11/2022 12:56    Procedures .Critical Care  Performed by: Marily Memos, MD Authorized by: Marily Memos, MD   Critical care provider statement:    Critical care time (minutes):  30   Critical care was necessary to treat or prevent imminent or life-threatening deterioration of the following conditions:  Respiratory failure   Critical care was time spent personally by me on the following activities:  Development of treatment plan with patient or surrogate, discussions with consultants, evaluation of patient's response to treatment, examination of patient, ordering and review of laboratory studies, ordering and review of radiographic studies, ordering and performing treatments and interventions, pulse oximetry, re-evaluation of patient's condition and review of old charts     Medications Ordered in ED Medications  albuterol (PROVENTIL,VENTOLIN) solution continuous neb (10 mg/hr Nebulization New Bag/Given 11/12/22 0246)  ipratropium-albuterol (DUONEB) 0.5-2.5 (3) MG/3ML nebulizer solution 3 mL (has no administration in time range)  ipratropium-albuterol (DUONEB) 0.5-2.5 (3) MG/3ML nebulizer solution 3 mL (has no administration in time range)  dextromethorphan-guaiFENesin (MUCINEX DM) 30-600 MG per 12 hr tablet 1 tablet (has no administration in time range)  methylPREDNISolone sodium succinate (SOLU-MEDROL) 40 mg/mL injection 40 mg (has no administration in time range)  azithromycin (ZITHROMAX) tablet 500 mg (has no administration in time range)    Followed by  azithromycin (ZITHROMAX) tablet 250 mg (has no administration in time range)  pantoprazole (PROTONIX) EC tablet 40 mg (has no administration in time range)  FLUoxetine (PROZAC) capsule 80 mg (has no administration in time range)  levETIRAcetam (KEPPRA) tablet 500 mg (has no administration in time range)  hydrALAZINE (APRESOLINE) injection 10 mg (has no administration in time range)  amLODipine (NORVASC) tablet 5 mg (has no administration in time range)  ipratropium (ATROVENT) nebulizer solution 1 mg (1  mg Nebulization Given 11/12/22 0246)  methylPREDNISolone sodium succinate (SOLU-MEDROL) 125 mg/2 mL injection 125 mg (125 mg Intravenous Given 11/12/22 0304)  magnesium sulfate IVPB 2 g 50 mL (0 g Intravenous Stopped 11/12/22 0403)  lactated ringers bolus 1,000 mL (1,000 mLs Intravenous New Bag/Given 11/12/22 0547)  doxycycline (VIBRA-TABS) tablet 100 mg (100 mg Oral Given 11/12/22 0546)    ED Course/ Medical Decision Making/ A&P                           Medical Decision Making Amount and/or Complexity of Data Reviewed Labs: ordered. Radiology: ordered.  Risk Prescription drug management. Decision regarding hospitalization.   Chest x-ray reviewed by myself and does appear she may have commune acquired pneumonia however more likely bronchitis from RSV.  She does have elevated white blood cell count however is probably related to steroids that she is not febrile.  Does have a productive cough of some sort so we will go and start antibiotics.  Multiple albuterol treatments given here along with ipratropium, magnesium and patient's respiratory rate has improved some her lungs have opened up quite a bit  with still some residual wheezing but she is now hypoxic 87% at rest.  Shared decision making with the patient and we will go ahead and admit her to the hospital secondary to the hypoxia.  Antibiotics have already being given.  Discussed with hospitalist.   Final Clinical Impression(s) / ED Diagnoses Final diagnoses:  Acute respiratory failure with hypoxia [facility])    Rx / DC Orders ED Discharge Orders     None         Chessica Audia, Barbara Cower, MD 11/12/22 [phone]

## 2022-11-12 NOTE — TOC Progression Note (Signed)
Transition of Care [facility]) - Progression Note    Patient Details  Name: Kelsey [name] MRN: [id number] Date of Birth: Sep 20, 1981  Transition of Care [facility]) CM/SW Contact  Karn Cassis, [place] Phone Number: 11/12/2022, 10:54 AM  Clinical Narrative:  Transition of Care [facility]) Screening Note   Patient Details  Name: Kelsey [name] Date of Birth: [birth year]   Transition of Care [facility]) CM/SW Contact:    Karn Cassis, LCSW Phone Number: 11/12/2022, 10:54 AM    Transition of Care Department [facility]) has reviewed patient and no TOC needs have been identified at this time. We will continue to monitor patient advancement through interdisciplinary progression rounds. If new patient transition needs arise, please place a TOC consult.             Expected Discharge Plan and Services                                                 Social Determinants of Health (SDOH) Interventions    Readmission Risk Interventions     No data to display

## 2022-11-12 NOTE — ED Notes (Signed)
Pt was given breakfast tray 

## 2022-11-12 NOTE — ED Notes (Addendum)
Pt called out for SOB and RN went in to assess pt. Pt with increased respirations and 93% on RA. Pt this instructed pt to slow her breathing and pt placed on 2 L 02 for comfort.    Respiratory has been called and to assess pt

## 2022-11-13 ENCOUNTER — Telehealth: Payer: Self-pay

## 2022-11-13 DIAGNOSIS — Z720 Tobacco use: Secondary | ICD-10-CM | POA: Diagnosis not present

## 2022-11-13 DIAGNOSIS — B338 Other specified viral diseases: Secondary | ICD-10-CM | POA: Diagnosis not present

## 2022-11-13 DIAGNOSIS — Z7952 Long term (current) use of systemic steroids: Secondary | ICD-10-CM | POA: Diagnosis not present

## 2022-11-13 DIAGNOSIS — G40909 Epilepsy, unspecified, not intractable, without status epilepticus: Secondary | ICD-10-CM | POA: Diagnosis present

## 2022-11-13 DIAGNOSIS — Z825 Family history of asthma and other chronic lower respiratory diseases: Secondary | ICD-10-CM | POA: Diagnosis not present

## 2022-11-13 DIAGNOSIS — F17210 Nicotine dependence, cigarettes, uncomplicated: Secondary | ICD-10-CM | POA: Diagnosis present

## 2022-11-13 DIAGNOSIS — Z20822 Contact with and (suspected) exposure to covid-19: Secondary | ICD-10-CM | POA: Diagnosis present

## 2022-11-13 DIAGNOSIS — R569 Unspecified convulsions: Secondary | ICD-10-CM | POA: Diagnosis not present

## 2022-11-13 DIAGNOSIS — J4521 Mild intermittent asthma with (acute) exacerbation: Secondary | ICD-10-CM | POA: Diagnosis not present

## 2022-11-13 DIAGNOSIS — K219 Gastro-esophageal reflux disease without esophagitis: Secondary | ICD-10-CM | POA: Diagnosis present

## 2022-11-13 DIAGNOSIS — F419 Anxiety disorder, unspecified: Secondary | ICD-10-CM | POA: Diagnosis present

## 2022-11-13 DIAGNOSIS — J9601 Acute respiratory failure with hypoxia: Secondary | ICD-10-CM | POA: Diagnosis present

## 2022-11-13 DIAGNOSIS — Z91148 Patient's other noncompliance with medication regimen for other reason: Secondary | ICD-10-CM | POA: Diagnosis not present

## 2022-11-13 DIAGNOSIS — Z96643 Presence of artificial hip joint, bilateral: Secondary | ICD-10-CM | POA: Diagnosis present

## 2022-11-13 DIAGNOSIS — Z8249 Family history of ischemic heart disease and other diseases of the circulatory system: Secondary | ICD-10-CM | POA: Diagnosis not present

## 2022-11-13 DIAGNOSIS — E785 Hyperlipidemia, unspecified: Secondary | ICD-10-CM | POA: Diagnosis present

## 2022-11-13 DIAGNOSIS — J45901 Unspecified asthma with (acute) exacerbation: Secondary | ICD-10-CM | POA: Diagnosis present

## 2022-11-13 DIAGNOSIS — I10 Essential (primary) hypertension: Secondary | ICD-10-CM | POA: Diagnosis present

## 2022-11-13 DIAGNOSIS — J302 Other seasonal allergic rhinitis: Secondary | ICD-10-CM | POA: Diagnosis present

## 2022-11-13 DIAGNOSIS — F32A Depression, unspecified: Secondary | ICD-10-CM | POA: Diagnosis present

## 2022-11-13 DIAGNOSIS — Z880 Allergy status to penicillin: Secondary | ICD-10-CM | POA: Diagnosis not present

## 2022-11-13 DIAGNOSIS — J205 Acute bronchitis due to respiratory syncytial virus: Secondary | ICD-10-CM | POA: Diagnosis present

## 2022-11-13 DIAGNOSIS — Z79899 Other long term (current) drug therapy: Secondary | ICD-10-CM | POA: Diagnosis not present

## 2022-11-13 LAB — MAGNESIUM: Magnesium: 2.1 mg/dL (ref 1.7–2.4)

## 2022-11-13 LAB — COMPREHENSIVE METABOLIC PANEL
ALT: 10 U/L (ref 0–44)
AST: 14 U/L — ABNORMAL LOW (ref 15–41)
Albumin: 3.4 g/dL — ABNORMAL LOW (ref 3.5–5.0)
Alkaline Phosphatase: 52 U/L (ref 38–126)
Anion gap: 6 (ref 5–15)
BUN: 13 mg/dL (ref 6–20)
CO2: 26 mmol/L (ref 22–32)
Calcium: 8.6 mg/dL — ABNORMAL LOW (ref 8.9–10.3)
Chloride: 104 mmol/L (ref 98–111)
Creatinine, Ser: 0.54 mg/dL (ref 0.44–1.00)
GFR, Estimated: >60 mL/min (ref >60)
Glucose, Bld: 102 mg/dL — ABNORMAL HIGH (ref 70–99)
Potassium: 4 mmol/L (ref 3.5–5.1)
Sodium: 136 mmol/L (ref 135–145)
Total Bilirubin: 0.4 mg/dL (ref 0.3–1.2)
Total Protein: 6.4 g/dL — ABNORMAL LOW (ref 6.5–8.1)

## 2022-11-13 LAB — CBC
HCT: 38.1 % (ref 36.0–46.0)
Hemoglobin: 12.6 g/dL (ref 12.0–15.0)
MCH: 32.6 pg (ref 26.0–34.0)
MCHC: 33.1 g/dL (ref 30.0–36.0)
MCV: 98.7 fL (ref 80.0–100.0)
Platelets: 262 10*3/uL (ref 150–400)
RBC: 3.86 MIL/uL — ABNORMAL LOW (ref 3.87–5.11)
RDW: 13.2 % (ref 11.5–15.5)
WBC: 12.1 10*3/uL — ABNORMAL HIGH (ref 4.0–10.5)
nRBC: 0 % (ref 0.0–0.2)

## 2022-11-13 LAB — BLOOD GAS, VENOUS
Acid-Base Excess: 5 mmol/L — ABNORMAL HIGH (ref 0.0–2.0)
Bicarbonate: 29.9 mmol/L — ABNORMAL HIGH (ref 20.0–28.0)
Drawn by: 66460
O2 Saturation: 97.2 %
Patient temperature: 37.3
pCO2, Ven: 45 mmHg (ref 44–60)
pH, Ven: 7.44 — ABNORMAL HIGH (ref 7.25–7.43)
pO2, Ven: 80 mmHg — ABNORMAL HIGH (ref 32–45)

## 2022-11-13 MED ORDER — IPRATROPIUM BROMIDE 0.02 % IN SOLN
0.5000 mg | RESPIRATORY_TRACT | Status: DC
  Administered 2022-11-13 – 2022-11-15 (×11): 0.5 mg via RESPIRATORY_TRACT
  Filled 2022-11-13 (×11): qty 2.5

## 2022-11-13 MED ORDER — K PHOS MONO-SOD PHOS DI & MONO 155-852-130 MG PO TABS
500.0000 mg | ORAL_TABLET | Freq: Two times a day (BID) | ORAL | Status: DC
  Administered 2022-11-13 – 2022-11-14 (×3): 500 mg via ORAL
  Filled 2022-11-13 (×3): qty 2

## 2022-11-13 MED ORDER — HYDROXYZINE HCL 25 MG PO TABS
25.0000 mg | ORAL_TABLET | Freq: Three times a day (TID) | ORAL | Status: DC
  Administered 2022-11-13 (×2): 25 mg via ORAL
  Filled 2022-11-13 (×2): qty 1

## 2022-11-13 MED ORDER — HYDROXYZINE HCL 25 MG PO TABS
25.0000 mg | ORAL_TABLET | Freq: Three times a day (TID) | ORAL | Status: DC
  Administered 2022-11-13 – 2022-11-15 (×5): 25 mg via ORAL
  Filled 2022-11-13 (×5): qty 1

## 2022-11-13 MED ORDER — MAGNESIUM SULFATE 2 GM/50ML IV SOLN
2.0000 g | Freq: Once | INTRAVENOUS | Status: AC
  Administered 2022-11-13: 2 g via INTRAVENOUS
  Filled 2022-11-13: qty 50

## 2022-11-13 MED ORDER — LEVALBUTEROL HCL 0.63 MG/3ML IN NEBU
0.6300 mg | INHALATION_SOLUTION | RESPIRATORY_TRACT | Status: DC
  Administered 2022-11-13 – 2022-11-15 (×11): 0.63 mg via RESPIRATORY_TRACT
  Filled 2022-11-13 (×11): qty 3

## 2022-11-13 MED ORDER — HYDROCOD POLI-CHLORPHE POLI ER 10-8 MG/5ML PO SUER
5.0000 mL | Freq: Two times a day (BID) | ORAL | Status: DC
  Administered 2022-11-13 – 2022-11-15 (×4): 5 mL via ORAL
  Filled 2022-11-13 (×4): qty 5

## 2022-11-13 NOTE — Progress Notes (Signed)
PROGRESS NOTE  FRANKLIN CLAPSADDLE ZOX:[id number] DOB: [birth year] DOA: 11/12/2022 PCP: Anabel Halon, MD  41 year old female with a history of hypertension, hyperlipidemia, asthma, seizure disorder, anxiety, tobacco abuse, polysubstance abuse (alcohol, benzodiazepines, THC) in remission presenting with nearly 10-day history of shortness of breath and nonproductive cough.  The patient visited the ED on 11/09/2022.  She was given nebulizer treatments, Solu-Medrol, magnesium with improvement, and she was sent home in stable condition with albuterol inhaler and prednisone 40 mg daily.  Unfortunately, she continued to have shortness of breath and coughing.  This worsened on 11/11/2022.  As result she presented for further evaluation and treatment.  She denies any fevers, chills, chest pain, hemoptysis, nausea, vomiting, diarrhea, abdominal pain.  She states that her stepson has also been sick with a respiratory illness. In the ED, the patient was afebrile and hemodynamically stable with oxygen saturation 87% room air.  RSV was positive.  Chest x-ray showed hyperacute inflation with right lower lobe haziness.  WBC 10.9, hemoglobin 14.0, platelets 270,000.  Sodium 136, potassium 3.5, bicarbonate 25, serum creatinine 0.55.  EKG shows sinus tachycardia with nonspecific T wave changes.  The patient was given an hour-long nebulizer treatment and started on intravenous Solu-Medrol.  She was admitted for further evaluation and treatment.   Assessment/Plan: Acute Respiratory Failure with Hypoxia -due to Asthma exacerbation in setting of RSV -Presented with tachypnea and oxygen saturation 87% on room air -remains sob, no much better -up to 3-4L [place] -Wean oxygen for saturation greater 92% -check VBG   Acute asthma exacerbation -She is not on any maintenance medications at home -continue budesonide -continue Xopenex in the setting of tachycardia with albuterol>>increase to q4 hours -Continue IV  Solu-Medrol -add atarax for anxiety   RSV bronchitis -Symptomatic treatment as above   Tobacco abuse -She has a 10-15-pack-year history -Tobacco cessation discussed   Seizure disorder -Follows Dr. Patrcia Dolly -Restart Keppra   Anxiety -Continue fluoxetine   THC use -Risks and benefits discussed   Hypophosphatemia -start Kphos           Family Communication:  no Family at bedside   Consultants:  none   Code Status:  FULL    DVT Prophylaxis:  [place] Lovenox     Procedures: As Listed in Progress Note Above   Antibiotics: Azithro  12/17>>          Subjective: Pt states her breathing is not that much better.  Continues to have sob and dry cough.  Denies hemoptysis.  Denies n/v/d, abd pain, f'c  Objective: Vitals:   11/13/22 0237 11/13/22 0602 11/13/22 0606 11/13/22 0633  BP: (!) 137/98   126/87  Pulse: 80   85  Resp: 16   18  Temp: 98.6 F (37 C)   99.2 F (37.3 C)  TempSrc: Oral   Oral  SpO2: 100% 97% 96% 99%  Weight: 57.6 kg     Height:       No intake or output data in the 24 hours ending 11/13/22 0706 Weight change: -1.38 kg Exam:  General:  Pt is alert, follows commands appropriately, not in acute distress HEENT: No icterus, No thrush, No neck mass, [place]/AT Cardiovascular: RRR, S1/S2, no rubs, no gallops Respiratory: bibasilar rales.  Bilateral exp wheeze Abdomen: Soft/+BS, non tender, non distended, no guarding Extremities: No edema, No lymphangitis, No petechiae, No rashes, no synovitis   Data Reviewed: I have personally reviewed following labs and imaging studies  Basic Metabolic Panel: Recent Labs  Lab 11/09/22 0829 11/12/22 0259 11/12/22 0600  NA 135 136  --   K 3.5 3.5  --   CL 105 104  --   CO2 23 25  --   GLUCOSE 87 84  --   BUN 9 17  --   CREATININE 0.51 0.55 0.57  CALCIUM 8.6* 8.6*  --   MG  --  1.9  --   PHOS  --   --  2.3*   Liver Function Tests: Recent Labs  Lab 11/09/22 0829  AST 14*  ALT 10  ALKPHOS 65   BILITOT 0.4  PROT 7.3  ALBUMIN 3.8   No results for input(s): "LIPASE", "AMYLASE" in the last 168 hours. No results for input(s): "AMMONIA" in the last 168 hours. Coagulation Profile: No results for input(s): "INR", "PROTIME" in the last 168 hours. CBC: Recent Labs  Lab 11/09/22 0829 11/12/22 0259 11/12/22 0600  WBC 6.5 10.9* 11.3*  NEUTROABS 3.4 8.5*  --   HGB 14.8 14.0 13.1  HCT 43.7 42.9 40.5  MCV 98.0 99.8 101.3*  PLT 298 278 282   Cardiac Enzymes: No results for input(s): "CKTOTAL", "CKMB", "CKMBINDEX", "TROPONINI" in the last 168 hours. BNP: Invalid input(s): "POCBNP" CBG: No results for input(s): "GLUCAP" in the last 168 hours. HbA1C: No results for input(s): "HGBA1C" in the last 72 hours. Urine analysis:    Component Value Date/Time   COLORURINE AMBER (A) 06/29/2022 0736   APPEARANCEUR CLOUDY (A) 06/29/2022 0736   LABSPEC 1.040 (H) 06/29/2022 0736   PHURINE 5.0 06/29/2022 0736   GLUCOSEU NEGATIVE 06/29/2022 0736   HGBUR NEGATIVE 06/29/2022 0736   BILIRUBINUR NEGATIVE 06/29/2022 0736   KETONESUR 5 (A) 06/29/2022 0736   PROTEINUR 100 (A) 06/29/2022 0736   UROBILINOGEN 0.2 01/17/2015 0851   NITRITE NEGATIVE 06/29/2022 0736   LEUKOCYTESUR MODERATE (A) 06/29/2022 0736   Sepsis Labs: @LABRCNTIP (procalcitonin:4,lacticidven:4) ) Recent Results (from the past 240 hour(s))  Resp panel by RT-PCR (RSV, Flu A&B, Covid) Anterior Nasal Swab     Status: Abnormal   Collection Time: 11/12/22  1:28 AM   Specimen: Anterior Nasal Swab  Result Value Ref Range Status   SARS Coronavirus 2 by RT PCR NEGATIVE NEGATIVE Final    Comment: (NOTE) SARS-CoV-2 target nucleic acids are NOT DETECTED.  The SARS-CoV-2 RNA is generally detectable in upper respiratory specimens during the acute phase of infection. The lowest concentration of SARS-CoV-2 viral copies this assay can detect is 138 copies/mL. A negative result does not preclude SARS-Cov-2 infection and should not be used  as the sole basis for treatment or other patient management decisions. A negative result may occur with  improper specimen collection/handling, submission of specimen other than nasopharyngeal swab, presence of viral mutation(s) within the areas targeted by this assay, and inadequate number of viral copies(<138 copies/mL). A negative result must be combined with clinical observations, patient history, and epidemiological information. The expected result is Negative.  Fact Sheet for Patients:  11/14/22  Fact Sheet for Healthcare Providers:  BloggerCourse.com  This test is no t yet approved or cleared by the [place] FDA and  has been authorized for detection and/or diagnosis of SARS-CoV-2 by FDA under an Emergency Use Authorization (EUA). This EUA will remain  in effect (meaning this test can be used) for the duration of the COVID-19 declaration under Section 564(b)(1) of the Act, 21 U.S.C.section 360bbb-3(b)(1), unless the authorization is terminated  or revoked sooner.  Influenza A by PCR NEGATIVE NEGATIVE Final   Influenza B by PCR NEGATIVE NEGATIVE Final    Comment: (NOTE) The Xpert Xpress SARS-CoV-2/FLU/RSV plus assay is intended as an aid in the diagnosis of influenza from Nasopharyngeal swab specimens and should not be used as a sole basis for treatment. Nasal washings and aspirates are unacceptable for Xpert Xpress SARS-CoV-2/FLU/RSV testing.  Fact Sheet for Patients: BloggerCourse.com  Fact Sheet for Healthcare Providers: SeriousBroker.it  This test is not yet approved or cleared by the [place] FDA and has been authorized for detection and/or diagnosis of SARS-CoV-2 by FDA under an Emergency Use Authorization (EUA). This EUA will remain in effect (meaning this test can be used) for the duration of the COVID-19 declaration under Section 564(b)(1)  of the Act, 21 U.S.C. section 360bbb-3(b)(1), unless the authorization is terminated or revoked.     Resp Syncytial Virus by PCR POSITIVE (A) NEGATIVE Final    Comment: (NOTE) Fact Sheet for Patients: BloggerCourse.com  Fact Sheet for Healthcare Providers: [employer]it  This test is not yet approved or cleared by the [place] FDA and has been authorized for detection and/or diagnosis of SARS-CoV-2 by FDA under an Emergency Use Authorization (EUA). This EUA will remain in effect (meaning this test can be used) for the duration of the COVID-19 declaration under Section 564(b)(1) of the Act, 21 U.S.C. section 360bbb-3(b)(1), unless the authorization is terminated or revoked.  Performed at [facility], [address], [place], [place] [address]      Scheduled Meds:  azithromycin  250 mg Oral Daily   budesonide (PULMICORT) nebulizer solution  0.5 mg Nebulization BID   dextromethorphan-guaiFENesin  1 tablet Oral BID   enoxaparin (LOVENOX) injection  40 mg Subcutaneous Q24H   FLUoxetine  60 mg Oral Daily   hydrOXYzine  25 mg Oral TID   ipratropium  0.5 mg Nebulization Q6H   levalbuterol  0.63 mg Nebulization Q6H   levETIRAcetam  500 mg Oral BID   methylPREDNISolone (SOLU-MEDROL) injection  60 mg Intravenous Q12H   pantoprazole  40 mg Oral Daily   phosphorus  500 mg Oral BID   Continuous Infusions:  Procedures/Studies: DG Chest 2 View  Result Date: 11/12/2022 CLINICAL DATA:  Shortness of breath.  Possible asthma exacerbation. EXAM: CHEST - 2 VIEW COMPARISON:  Portable chest 11/09/2022 . FINDINGS: The heart size and mediastinal contours are within normal limits. Both lungs are hyperexpanded, with interval increased right infrahilar haziness which could be due to atelectasis or a small pneumonia. The remaining lungs are clear. The visualized skeletal structures are unremarkable. IMPRESSION: Both lungs are hyperexpanded, with  interval increased right infrahilar haziness which could be due to atelectasis or a small pneumonia. Follow-up as indicated. Electronically Signed   By: Almira Bar M.D.   On: 11/12/2022 01:56   MM DIAG BREAST TOMO UNI RIGHT  Result Date: 11/11/2022 CLINICAL DATA:  Recall from screening to evaluate 2 possible right breast asymmetries. EXAM: DIGITAL DIAGNOSTIC UNILATERAL RIGHT MAMMOGRAM WITH TOMOSYNTHESIS; ULTRASOUND RIGHT BREAST LIMITED TECHNIQUE: Right digital diagnostic mammography and breast tomosynthesis was performed.; Targeted ultrasound examination of the right breast was performed COMPARISON:  Recent screening mammogram 10/27/2022 ACR Breast Density Category b: There are scattered areas of fibroglandular density. FINDINGS: Additional spot compression tomographic images of the right breast were obtained. There is persistent, but slightly less apparent asymmetry over the upper-outer right breast in the middle third. There is also persistent, but less apparent asymmetry over the anterior third of the upper outer right  periareolar region. These findings likely due to asymmetric fibroglandular tissue. Targeted ultrasound is performed, showing patchy pattern of fibroglandular tissue over the upper-outer right breast the 10 o'clock position 3 cm from the nipple and 11 o'clock position 6 cm from the nipple likely accounting for the screening mammographic findings. IMPRESSION: No concerning findings within the right breast as the possible screening asymmetries are due to patchy asymmetric fibroglandular tissue. RECOMMENDATION: Recommend continued annual bilateral screening mammographic follow-up. I have discussed the findings and recommendations with the patient. If applicable, a reminder letter will be sent to the patient regarding the next appointment. BI-RADS CATEGORY  1: Negative. Electronically Signed   By: Elberta Fortis M.D.   On: 11/11/2022 12:56   US BREAST LTD UNI RIGHT INC AXILLA  Result Date:  11/11/2022 CLINICAL DATA:  Recall from screening to evaluate 2 possible right breast asymmetries. EXAM: DIGITAL DIAGNOSTIC UNILATERAL RIGHT MAMMOGRAM WITH TOMOSYNTHESIS; ULTRASOUND RIGHT BREAST LIMITED TECHNIQUE: Right digital diagnostic mammography and breast tomosynthesis was performed.; Targeted ultrasound examination of the right breast was performed COMPARISON:  Recent screening mammogram 10/27/2022 ACR Breast Density Category b: There are scattered areas of fibroglandular density. FINDINGS: Additional spot compression tomographic images of the right breast were obtained. There is persistent, but slightly less apparent asymmetry over the upper-outer right breast in the middle third. There is also persistent, but less apparent asymmetry over the anterior third of the upper outer right periareolar region. These findings likely due to asymmetric fibroglandular tissue. Targeted ultrasound is performed, showing patchy pattern of fibroglandular tissue over the upper-outer right breast the 10 o'clock position 3 cm from the nipple and 11 o'clock position 6 cm from the nipple likely accounting for the screening mammographic findings. IMPRESSION: No concerning findings within the right breast as the possible screening asymmetries are due to patchy asymmetric fibroglandular tissue. RECOMMENDATION: Recommend continued annual bilateral screening mammographic follow-up. I have discussed the findings and recommendations with the patient. If applicable, a reminder letter will be sent to the patient regarding the next appointment. BI-RADS CATEGORY  1: Negative. Electronically Signed   By: Elberta Fortis M.D.   On: 11/11/2022 12:56   DG Chest Portable 1 View  Result Date: 11/09/2022 CLINICAL DATA:  Provided history: Dyspnea. EXAM: PORTABLE CHEST 1 VIEW COMPARISON:  Prior chest radiographs 12/20/2019 and earlier. FINDINGS: Heart size within normal limits. No appreciable airspace consolidation. A trace left pleural effusion  is questioned. No evidence of pneumothorax. No acute bony abnormality identified. IMPRESSION: A trace left pleural effusion is questioned. Otherwise, there is no evidence of acute cardiopulmonary abnormality. Electronically Signed   By: Jackey Loge D.O.   On: 11/09/2022 08:27   EEG adult  Result Date: 10/27/2022 Van Clines, MD     11/01/2022  4:44 PM ELECTROENCEPHALOGRAM REPORT Date of Study: 10/27/2022 Patient's Name: SHANISHA LECH MRN: [id number] Date of Birth: 17-Oct-1981 Referring Provider: Dr. Patrcia Dolly Clinical History: This is a 41 year old woman with convulsions and staring episodes with head/gaze turn to the left. EEG for classification. Medications: Keppra Prozac Technical Summary: A multichannel digital EEG recording measured by the international 10-20 system with electrodes applied with paste and impedances below 5000 ohms performed in our laboratory with EKG monitoring in an awake and asleep patient.  Hyperventilation and photic stimulation were performed.  The digital EEG was referentially recorded, reformatted, and digitally filtered in a variety of bipolar and referential montages for optimal display.  Description: The patient is awake and asleep during the recording.  During  maximal wakefulness, there is a symmetric, medium voltage 10 Hz posterior dominant rhythm that attenuates with eye opening.  The record is symmetric.  During drowsiness and sleep, there is an increase in theta slowing of the background.  Vertex waves and symmetric sleep spindles were seen. Hyperventilation and photic stimulation did not elicit any abnormalities.  There were no epileptiform discharges or electrographic seizures seen.  EKG lead was unremarkable. Impression: This  awake and asleep EEG is normal.  Clinical Correlation: A normal EEG does not exclude a clinical diagnosis of epilepsy.  If further clinical questions remain, prolonged EEG may be helpful.  Clinical correlation is advised. Patrcia DollyKaren Aquino, M.D.     Catarina Hartshornavid Macenzie Burford, DO  [facility]  If 7PM-7AM, please contact night-coverage [email or web address] Password [email or web address] 11/13/2022, 7:06 AM   LOS: 0 days

## 2022-11-13 NOTE — Telephone Encounter (Signed)
Transition Care Management Unsuccessful Follow-up Telephone Call  Date of discharge and from where:  [facility] 11/09/22  Attempts:  1st Attempt  Reason for unsuccessful TCM follow-up call:  Left voice message    

## 2022-11-14 DIAGNOSIS — J9601 Acute respiratory failure with hypoxia: Secondary | ICD-10-CM | POA: Diagnosis not present

## 2022-11-14 DIAGNOSIS — J4521 Mild intermittent asthma with (acute) exacerbation: Secondary | ICD-10-CM | POA: Diagnosis not present

## 2022-11-14 DIAGNOSIS — R569 Unspecified convulsions: Secondary | ICD-10-CM | POA: Diagnosis not present

## 2022-11-14 DIAGNOSIS — Z720 Tobacco use: Secondary | ICD-10-CM | POA: Diagnosis not present

## 2022-11-14 LAB — BASIC METABOLIC PANEL
Anion gap: 7 (ref 5–15)
BUN: 18 mg/dL (ref 6–20)
CO2: 28 mmol/L (ref 22–32)
Calcium: 8.5 mg/dL — ABNORMAL LOW (ref 8.9–10.3)
Chloride: 102 mmol/L (ref 98–111)
Creatinine, Ser: 0.56 mg/dL (ref 0.44–1.00)
GFR, Estimated: >60 mL/min (ref >60)
Glucose, Bld: 113 mg/dL — ABNORMAL HIGH (ref 70–99)
Potassium: 4.6 mmol/L (ref 3.5–5.1)
Sodium: 137 mmol/L (ref 135–145)

## 2022-11-14 LAB — MAGNESIUM: Magnesium: 2.4 mg/dL (ref 1.7–2.4)

## 2022-11-14 LAB — PHOSPHORUS: Phosphorus: 4.9 mg/dL — ABNORMAL HIGH (ref 2.5–4.6)

## 2022-11-14 MED ORDER — MELATONIN 3 MG PO TABS
3.0000 mg | ORAL_TABLET | Freq: Every evening | ORAL | Status: DC | PRN
  Administered 2022-11-14 (×2): 3 mg via ORAL
  Filled 2022-11-14 (×2): qty 1

## 2022-11-14 NOTE — Progress Notes (Signed)
On 3 liters [place] most of day.  Has received scheduled Tussinex as well as prn hycodan twice today for cough.  IV wrapped and waiting for husband to come with toiletries to shower.

## 2022-11-14 NOTE — Progress Notes (Signed)
PROGRESS NOTE  Kelsey [name] [id number] DOB: [birth year] DOA: 11/12/2022 PCP: Lindell Spar, MD  Brief History:  41 year old female with a history of hypertension, hyperlipidemia, asthma, seizure disorder, anxiety, tobacco abuse, polysubstance abuse (alcohol, benzodiazepines, THC) in remission presenting with nearly 10-day history of shortness of breath and nonproductive cough.  The patient visited the ED on 11/09/2022.  She was given nebulizer treatments, Solu-Medrol, magnesium with improvement, and she was sent home in stable condition with albuterol inhaler and prednisone 40 mg daily.  Unfortunately, she continued to have shortness of breath and coughing.  This worsened on 11/11/2022.  As result she presented for further evaluation and treatment.  She denies any fevers, chills, chest pain, hemoptysis, nausea, vomiting, diarrhea, abdominal pain.  She states that her stepson has also been sick with a respiratory illness. In the ED, the patient was afebrile and hemodynamically stable with oxygen saturation 87% room air.  RSV was positive.  Chest x-ray showed hyperacute inflation with right lower lobe haziness.  WBC 10.9, hemoglobin 14.0, platelets 270,000.  Sodium 136, potassium 3.5, bicarbonate 25, serum creatinine 0.55.  EKG shows sinus tachycardia with nonspecific T wave changes.  The patient was given an hour-long nebulizer treatment and started on intravenous Solu-Medrol.  She was admitted for further evaluation and treatment.    Assessment/Plan: Acute Respiratory Failure with Hypoxia -due to Asthma exacerbation in setting of RSV -Presented with tachypnea and oxygen saturation 87% on room air -remains sob, no much better -up to 3L [place] -Wean oxygen for saturation greater 92% -check VBG 7.44/45/80/29 -12/19--still desaturates to 87% on RA   Acute asthma exacerbation -She is not on any maintenance medications at home -continue budesonide -continue Xopenex in the setting of  tachycardia with albuterol>>increase to q4 hours -Continue IV Solu-Medrol -add atarax for anxiety -added Tussionex   RSV bronchitis -Symptomatic treatment as above   Tobacco abuse -She has a 10-15-pack-year history -Tobacco cessation discussed   Seizure disorder -Follows Dr. Ellouise Newer -Restart Keppra   Anxiety -Continue fluoxetine   THC use -Risks and benefits discussed   Hypophosphatemia -start Kphos>>improved           Family Communication:  no Family at bedside   Consultants:  none   Code Status:  FULL    DVT Prophylaxis:  [place] Lovenox     Procedures: As Listed in Progress Note Above   Antibiotics: Azithro  12/17>>       Subjective: Breathing better.  Denies f/c, cp, n/v/d, abd pain.  Has dyspnea on exertion.  Able to bring up some sputum.  No hemoptysis  Objective: Vitals:   11/14/22 1222 11/14/22 1223 11/14/22 1340 11/14/22 1611  BP:   105/66   Pulse:   68 73  Resp:   18 18  Temp:   98 F (36.7 C)   TempSrc:   Oral   SpO2: (!) 87% 92% 93% 94%  Weight:      Height:        Intake/Output Summary (Last 24 hours) at 11/14/2022 1702 Last data filed at 11/14/2022 1300 Gross per 24 hour  Intake 640 ml  Output --  Net 640 ml   Weight change:  Exam:  General:  Pt is alert, follows commands appropriately, not in acute distress HEENT: No icterus, No thrush, No neck mass, [place]/AT Cardiovascular: RRR, S1/S2, no rubs, no gallops Respiratory: bilateral exp wheeze.  Bilateral wheeze Abdomen: Soft/+BS, non tender, non distended, no guarding Extremities:  No edema, No lymphangitis, No petechiae, No rashes, no synovitis   Data Reviewed: I have personally reviewed following labs and imaging studies Basic Metabolic Panel: Recent Labs  Lab 11/09/22 0829 11/12/22 0259 11/12/22 0600 11/13/22 0817 11/14/22 0454  NA 135 136  --  136 137  K 3.5 3.5  --  4.0 4.6  CL 105 104  --  104 102  CO2 23 25  --  26 28  GLUCOSE 87 84  --  102* 113*  BUN 9  17  --  13 18  CREATININE 0.51 0.55 0.57 0.54 0.56  CALCIUM 8.6* 8.6*  --  8.6* 8.5*  MG  --  1.9  --  2.1 2.4  PHOS  --   --  2.3*  --  4.9*   Liver Function Tests: Recent Labs  Lab 11/09/22 0829 11/13/22 0817  AST 14* 14*  ALT 10 10  ALKPHOS 65 52  BILITOT 0.4 0.4  PROT 7.3 6.4*  ALBUMIN 3.8 3.4*   No results for input(s): "LIPASE", "AMYLASE" in the last 168 hours. No results for input(s): "AMMONIA" in the last 168 hours. Coagulation Profile: No results for input(s): "INR", "PROTIME" in the last 168 hours. CBC: Recent Labs  Lab 11/09/22 0829 11/12/22 0259 11/12/22 0600 11/13/22 0817  WBC 6.5 10.9* 11.3* 12.1*  NEUTROABS 3.4 8.5*  --   --   HGB 14.8 14.0 13.1 12.6  HCT 43.7 42.9 40.5 38.1  MCV 98.0 99.8 101.3* 98.7  PLT 298 278 282 262   Cardiac Enzymes: No results for input(s): "CKTOTAL", "CKMB", "CKMBINDEX", "TROPONINI" in the last 168 hours. BNP: Invalid input(s): "POCBNP" CBG: No results for input(s): "GLUCAP" in the last 168 hours. HbA1C: No results for input(s): "HGBA1C" in the last 72 hours. Urine analysis:    Component Value Date/Time   COLORURINE AMBER (A) 06/29/2022 0736   APPEARANCEUR CLOUDY (A) 06/29/2022 0736   LABSPEC 1.040 (H) 06/29/2022 0736   PHURINE 5.0 06/29/2022 0736   GLUCOSEU NEGATIVE 06/29/2022 0736   HGBUR NEGATIVE 06/29/2022 0736   BILIRUBINUR NEGATIVE 06/29/2022 0736   KETONESUR 5 (A) 06/29/2022 0736   PROTEINUR 100 (A) 06/29/2022 0736   UROBILINOGEN 0.2 01/17/2015 0851   NITRITE NEGATIVE 06/29/2022 0736   LEUKOCYTESUR MODERATE (A) 06/29/2022 0736   Sepsis Labs: @LABRCNTIP (procalcitonin:4,lacticidven:4) ) Recent Results (from the past 240 hour(s))  Resp panel by RT-PCR (RSV, Flu A&B, Covid) Anterior Nasal Swab     Status: Abnormal   Collection Time: 11/12/22  1:28 AM   Specimen: Anterior Nasal Swab  Result Value Ref Range Status   SARS Coronavirus 2 by RT PCR NEGATIVE NEGATIVE Final    Comment: (NOTE) SARS-CoV-2 target  nucleic acids are NOT DETECTED.  The SARS-CoV-2 RNA is generally detectable in upper respiratory specimens during the acute phase of infection. The lowest concentration of SARS-CoV-2 viral copies this assay can detect is 138 copies/mL. A negative result does not preclude SARS-Cov-2 infection and should not be used as the sole basis for treatment or other patient management decisions. A negative result may occur with  improper specimen collection/handling, submission of specimen other than nasopharyngeal swab, presence of viral mutation(s) within the areas targeted by this assay, and inadequate number of viral copies(<138 copies/mL). A negative result must be combined with clinical observations, patient history, and epidemiological information. The expected result is Negative.  Fact Sheet for Patients:  EntrepreneurPulse.com.au  Fact Sheet for Healthcare Providers:  IncredibleEmployment.be  This test is no t yet approved or cleared by  the [employer] and  has been authorized for detection and/or diagnosis of SARS-CoV-2 by FDA under an Emergency Use Authorization (EUA). This EUA will remain  in effect (meaning this test can be used) for the duration of the COVID-19 declaration under Section 564(b)(1) of the Act, 21 U.S.C.section 360bbb-3(b)(1), unless the authorization is terminated  or revoked sooner.       Influenza A by PCR NEGATIVE NEGATIVE Final   Influenza B by PCR NEGATIVE NEGATIVE Final    Comment: (NOTE) The Xpert Xpress SARS-CoV-2/FLU/RSV plus assay is intended as an aid in the diagnosis of influenza from Nasopharyngeal swab specimens and should not be used as a sole basis for treatment. Nasal washings and aspirates are unacceptable for Xpert Xpress SARS-CoV-2/FLU/RSV testing.  Fact Sheet for Patients: BloggerCourse.com  Fact Sheet for Healthcare  Providers: SeriousBroker.it  This test is not yet approved or cleared by the [place] FDA and has been authorized for detection and/or diagnosis of SARS-CoV-2 by FDA under an Emergency Use Authorization (EUA). This EUA will remain in effect (meaning this test can be used) for the duration of the COVID-19 declaration under Section 564(b)(1) of the Act, 21 U.S.C. section 360bbb-3(b)(1), unless the authorization is terminated or revoked.     Resp Syncytial Virus by PCR POSITIVE (A) NEGATIVE Final    Comment: (NOTE) Fact Sheet for Patients: BloggerCourse.com  Fact Sheet for Healthcare Providers: SeriousBroker.it  This test is not yet approved or cleared by the [place] FDA and has been authorized for detection and/or diagnosis of SARS-CoV-2 by FDA under an Emergency Use Authorization (EUA). This EUA will remain in effect (meaning this test can be used) for the duration of the COVID-19 declaration under Section 564(b)(1) of the Act, 21 U.S.C. section 360bbb-3(b)(1), unless the authorization is terminated or revoked.  Performed at [facility], [address], [place], [place] [address]      Scheduled Meds:  azithromycin  250 mg Oral Daily   budesonide (PULMICORT) nebulizer solution  0.5 mg Nebulization BID   chlorpheniramine-HYDROcodone  5 mL Oral Q12H   dextromethorphan-guaiFENesin  1 tablet Oral BID   enoxaparin (LOVENOX) injection  40 mg Subcutaneous Q24H   FLUoxetine  60 mg Oral Daily   hydrOXYzine  25 mg Oral TID   ipratropium  0.5 mg Nebulization Q4H   levalbuterol  0.63 mg Nebulization Q4H   levETIRAcetam  500 mg Oral BID   methylPREDNISolone (SOLU-MEDROL) injection  60 mg Intravenous Q12H   pantoprazole  40 mg Oral Daily   Continuous Infusions:  Procedures/Studies: DG Chest 2 View  Result Date: 11/12/2022 CLINICAL DATA:  Shortness of breath.  Possible asthma exacerbation. EXAM:  CHEST - 2 VIEW COMPARISON:  Portable chest 11/09/2022 . FINDINGS: The heart size and mediastinal contours are within normal limits. Both lungs are hyperexpanded, with interval increased right infrahilar haziness which could be due to atelectasis or a small pneumonia. The remaining lungs are clear. The visualized skeletal structures are unremarkable. IMPRESSION: Both lungs are hyperexpanded, with interval increased right infrahilar haziness which could be due to atelectasis or a small pneumonia. Follow-up as indicated. Electronically Signed   By: Almira Bar M.D.   On: 11/12/2022 01:56   MM DIAG BREAST TOMO UNI RIGHT  Result Date: 11/11/2022 CLINICAL DATA:  Recall from screening to evaluate 2 possible right breast asymmetries. EXAM: DIGITAL DIAGNOSTIC UNILATERAL RIGHT MAMMOGRAM WITH TOMOSYNTHESIS; ULTRASOUND RIGHT BREAST LIMITED TECHNIQUE: Right digital diagnostic mammography and breast tomosynthesis was performed.; Targeted ultrasound examination of the right  breast was performed COMPARISON:  Recent screening mammogram 10/27/2022 ACR Breast Density Category b: There are scattered areas of fibroglandular density. FINDINGS: Additional spot compression tomographic images of the right breast were obtained. There is persistent, but slightly less apparent asymmetry over the upper-outer right breast in the middle third. There is also persistent, but less apparent asymmetry over the anterior third of the upper outer right periareolar region. These findings likely due to asymmetric fibroglandular tissue. Targeted ultrasound is performed, showing patchy pattern of fibroglandular tissue over the upper-outer right breast the 10 o'clock position 3 cm from the nipple and 11 o'clock position 6 cm from the nipple likely accounting for the screening mammographic findings. IMPRESSION: No concerning findings within the right breast as the possible screening asymmetries are due to patchy asymmetric fibroglandular tissue.  RECOMMENDATION: Recommend continued annual bilateral screening mammographic follow-up. I have discussed the findings and recommendations with the patient. If applicable, a reminder letter will be sent to the patient regarding the next appointment. BI-RADS CATEGORY  1: Negative. Electronically Signed   By: Marin Olp M.D.   On: 11/11/2022 12:56   US BREAST LTD UNI RIGHT INC AXILLA  Result Date: 11/11/2022 CLINICAL DATA:  Recall from screening to evaluate 2 possible right breast asymmetries. EXAM: DIGITAL DIAGNOSTIC UNILATERAL RIGHT MAMMOGRAM WITH TOMOSYNTHESIS; ULTRASOUND RIGHT BREAST LIMITED TECHNIQUE: Right digital diagnostic mammography and breast tomosynthesis was performed.; Targeted ultrasound examination of the right breast was performed COMPARISON:  Recent screening mammogram 10/27/2022 ACR Breast Density Category b: There are scattered areas of fibroglandular density. FINDINGS: Additional spot compression tomographic images of the right breast were obtained. There is persistent, but slightly less apparent asymmetry over the upper-outer right breast in the middle third. There is also persistent, but less apparent asymmetry over the anterior third of the upper outer right periareolar region. These findings likely due to asymmetric fibroglandular tissue. Targeted ultrasound is performed, showing patchy pattern of fibroglandular tissue over the upper-outer right breast the 10 o'clock position 3 cm from the nipple and 11 o'clock position 6 cm from the nipple likely accounting for the screening mammographic findings. IMPRESSION: No concerning findings within the right breast as the possible screening asymmetries are due to patchy asymmetric fibroglandular tissue. RECOMMENDATION: Recommend continued annual bilateral screening mammographic follow-up. I have discussed the findings and recommendations with the patient. If applicable, a reminder letter will be sent to the patient regarding the next appointment.  BI-RADS CATEGORY  1: Negative. Electronically Signed   By: Marin Olp M.D.   On: 11/11/2022 12:56  DG Chest Portable 1 View  Result Date: 11/09/2022 CLINICAL DATA:  Provided history: Dyspnea. EXAM: PORTABLE CHEST 1 VIEW COMPARISON:  Prior chest radiographs 12/20/2019 and earlier. FINDINGS: Heart size within normal limits. No appreciable airspace consolidation. A trace left pleural effusion is questioned. No evidence of pneumothorax. No acute bony abnormality identified. IMPRESSION: A trace left pleural effusion is questioned. Otherwise, there is no evidence of acute cardiopulmonary abnormality. Electronically Signed   By: Kellie Simmering D.O.   On: 11/09/2022 08:27   EEG adult  Result Date: 10/27/2022 Cameron Sprang, MD     11/01/2022  4:44 PM ELECTROENCEPHALOGRAM REPORT Date of Study: 10/27/2022 Patient's Name: Kelsey [name] MRN: [id number] Date of Birth: May 24, 1981 Referring Provider: Dr. Ellouise Newer Clinical History: This is a 41 year old woman with convulsions and staring episodes with head/gaze turn to the left. EEG for classification. Medications: Keppra Prozac Technical Summary: A multichannel digital EEG recording measured by the international 10-20  system with electrodes applied with paste and impedances below 5000 ohms performed in our laboratory with EKG monitoring in an awake and asleep patient.  Hyperventilation and photic stimulation were performed.  The digital EEG was referentially recorded, reformatted, and digitally filtered in a variety of bipolar and referential montages for optimal display.  Description: The patient is awake and asleep during the recording.  During maximal wakefulness, there is a symmetric, medium voltage 10 Hz posterior dominant rhythm that attenuates with eye opening.  The record is symmetric.  During drowsiness and sleep, there is an increase in theta slowing of the background.  Vertex waves and symmetric sleep spindles were seen. Hyperventilation and photic  stimulation did not elicit any abnormalities.  There were no epileptiform discharges or electrographic seizures seen.  EKG lead was unremarkable. Impression: This  awake and asleep EEG is normal.  Clinical Correlation: A normal EEG does not exclude a clinical diagnosis of epilepsy.  If further clinical questions remain, prolonged EEG may be helpful.  Clinical correlation is advised. Ellouise Newer, M.D.    Orson Eva, DO  [facility]  If 7PM-7AM, please contact night-coverage [email or web address] Password [email or web address] 11/14/2022, 5:02 PM   LOS: 1 day

## 2022-11-15 DIAGNOSIS — I10 Essential (primary) hypertension: Secondary | ICD-10-CM | POA: Diagnosis not present

## 2022-11-15 DIAGNOSIS — J9601 Acute respiratory failure with hypoxia: Secondary | ICD-10-CM | POA: Diagnosis not present

## 2022-11-15 DIAGNOSIS — J45901 Unspecified asthma with (acute) exacerbation: Secondary | ICD-10-CM | POA: Diagnosis not present

## 2022-11-15 DIAGNOSIS — R569 Unspecified convulsions: Secondary | ICD-10-CM | POA: Diagnosis not present

## 2022-11-15 LAB — BASIC METABOLIC PANEL
Anion gap: 5 (ref 5–15)
BUN: 19 mg/dL (ref 6–20)
CO2: 29 mmol/L (ref 22–32)
Calcium: 8.4 mg/dL — ABNORMAL LOW (ref 8.9–10.3)
Chloride: 104 mmol/L (ref 98–111)
Creatinine, Ser: 0.56 mg/dL (ref 0.44–1.00)
GFR, Estimated: >60 mL/min (ref >60)
Glucose, Bld: 108 mg/dL — ABNORMAL HIGH (ref 70–99)
Potassium: 4.2 mmol/L (ref 3.5–5.1)
Sodium: 138 mmol/L (ref 135–145)

## 2022-11-15 LAB — PHOSPHORUS: Phosphorus: 3.9 mg/dL (ref 2.5–4.6)

## 2022-11-15 LAB — MAGNESIUM: Magnesium: 2.2 mg/dL (ref 1.7–2.4)

## 2022-11-15 MED ORDER — LEVALBUTEROL HCL 0.63 MG/3ML IN NEBU
0.6300 mg | INHALATION_SOLUTION | Freq: Four times a day (QID) | RESPIRATORY_TRACT | Status: DC
  Administered 2022-11-15: 0.63 mg via RESPIRATORY_TRACT
  Filled 2022-11-15: qty 3

## 2022-11-15 MED ORDER — PREDNISONE 10 MG PO TABS: ORAL_TABLET | ORAL | 0 refills | Status: DC

## 2022-11-15 MED ORDER — ALBUTEROL SULFATE (2.5 MG/3ML) 0.083% IN NEBU: 2.5000 mg | INHALATION_SOLUTION | RESPIRATORY_TRACT | 2 refills | Status: AC | PRN

## 2022-11-15 MED ORDER — IPRATROPIUM BROMIDE 0.02 % IN SOLN
0.5000 mg | Freq: Four times a day (QID) | RESPIRATORY_TRACT | Status: DC
  Administered 2022-11-15: 0.5 mg via RESPIRATORY_TRACT
  Filled 2022-11-15: qty 2.5

## 2022-11-15 MED ORDER — AZITHROMYCIN 250 MG PO TABS: 250.0000 mg | ORAL_TABLET | Freq: Every day | ORAL | 0 refills | Status: DC

## 2022-11-15 MED ORDER — HYDROCODONE BIT-HOMATROP MBR 5-1.5 MG/5ML PO SOLN: 5.0000 mL | ORAL | 0 refills | Status: DC | PRN

## 2022-11-15 NOTE — Progress Notes (Signed)
Patient ambulated a lap around the nurses station on RA. Patient's O2 saturation stayed in between 90-92%. Patient stated that she got a little SOB, but not enough to stop walking. MD Memon notified.

## 2022-11-15 NOTE — Discharge Summary (Signed)
Physician Discharge Summary  Kelsey RailJennifer L [name] DGU:[id number] DOB: Aug 01, 1981 DOA: 11/12/2022  PCP: Anabel HalonPatel, Rutwik K, MD  Admit date: 11/12/2022 Discharge date: 11/15/2022  Admitted From: Home Disposition: Home  Recommendations for Outpatient Follow-up:  Follow up with PCP in 1-2 weeks Please obtain BMP/CBC in one week    Discharge Condition: Stable CODE STATUS: Full code Diet recommendation: Regular diet  Brief/Interim Summary: 41 year old female with a history of hypertension, hyperlipidemia, asthma, seizure disorder, anxiety, tobacco abuse, polysubstance abuse (alcohol, benzodiazepines, THC) in remission presenting with nearly 10-day history of shortness of breath and nonproductive cough.  The patient visited the ED on 11/09/2022.  She was given nebulizer treatments, Solu-Medrol, magnesium with improvement, and she was sent home in stable condition with albuterol inhaler and prednisone 40 mg daily.  Unfortunately, she continued to have shortness of breath and coughing.  This worsened on 11/11/2022.  As result she presented for further evaluation and treatment.  She denies any fevers, chills, chest pain, hemoptysis, nausea, vomiting, diarrhea, abdominal pain.  She states that her stepson has also been sick with a respiratory illness. In the ED, the patient was afebrile and hemodynamically stable with oxygen saturation 87% room air.  RSV was positive.  Chest x-ray showed hyperacute inflation with right lower lobe haziness.  WBC 10.9, hemoglobin 14.0, platelets 270,000.  Sodium 136, potassium 3.5, bicarbonate 25, serum creatinine 0.55.  EKG shows sinus tachycardia with nonspecific T wave changes.  The patient was given an hour-long nebulizer treatment and started on intravenous Solu-Medrol.  She was admitted for further evaluation and treatment.  Discharge Diagnoses:  Principal Problem:   Acute respiratory failure with hypoxia (HCC) Active Problems:   Depression   Acute asthma  exacerbation   Essential hypertension   Tobacco abuse   Seizures (HCC)   RSV (respiratory syncytial virus infection)   Noncompliance with medication regimen  Acute Respiratory Failure with Hypoxia -due to Asthma exacerbation in setting of RSV -Presented with tachypnea and oxygen saturation 87% on room air -remains sob, no much better -up to 3L [place] -Wean oxygen for saturation greater 92% -check VBG 7.44/45/80/29 -At the time of discharge, she was able to ambulate on room air without significant hypoxia or shortness of breath.   Acute asthma exacerbation -She is not on any maintenance medications at home -Received in budesonide in the hospital -Resume albuterol nebulizer at home -Reports that outside of this RSV infection, her asthma is usually fairly well-controlled and she rarely needs to use her albuterol. -Treated with Solu-Medrol and subsequently transition to prednisone taper -Also treated with azithromycin for antibacterial superinfection -Continue cough suppressants   RSV bronchitis -Symptomatic treatment as above   Tobacco abuse -She has a 10-15-pack-year history -Tobacco cessation discussed   Seizure disorder -Follows Dr. Patrcia DollyKaren Aquino -Restart Keppra   Anxiety -Continue fluoxetine   THC use -Risks and benefits discussed   Hypophosphatemia -start Kphos>>improved  Discharge Instructions  Discharge Instructions     Diet - low sodium heart healthy   Complete by: As directed    Increase activity slowly   Complete by: As directed       Allergies as of 11/15/2022       Reactions   Penicillins Rash   Has patient had a PCN reaction causing immediate rash, facial/tongue/throat swelling, SOB or lightheadedness with hypotension: No Has patient had a PCN reaction causing severe rash involving mucus membranes or skin necrosis: No Has patient had a PCN reaction that required hospitalization No Has patient had a PCN reaction  occurring within the last 10 years:  No If all of the above answers are "NO", then may proceed with Cephalosporin use.        Medication List     TAKE these medications    albuterol 108 (90 Base) MCG/ACT inhaler Commonly known as: VENTOLIN HFA Inhale 1-2 puffs into the lungs every 6 (six) hours as needed for wheezing or shortness of breath. What changed: Another medication with the same name was added. Make sure you understand how and when to take each.   albuterol (2.5 MG/3ML) 0.083% nebulizer solution Commonly known as: PROVENTIL Take 3 mLs (2.5 mg total) by nebulization every 4 (four) hours as needed for wheezing or shortness of breath. What changed: You were already taking a medication with the same name, and this prescription was added. Make sure you understand how and when to take each.   azithromycin 250 MG tablet Commonly known as: ZITHROMAX Take 1 tablet (250 mg total) by mouth daily.   FLUoxetine 40 MG capsule Commonly known as: PROZAC Take 2 capsules (80 mg total) by mouth daily.   guaifenesin 100 MG/5ML syrup Commonly known as: ROBITUSSIN Take 200 mg by mouth 3 (three) times daily as needed for cough.   HYDROcodone bit-homatropine 5-1.5 MG/5ML syrup Commonly known as: HYCODAN Take 5 mLs by mouth every 4 (four) hours as needed for cough.   ibuprofen 200 MG tablet Commonly known as: ADVIL Take 800 mg by mouth every 6 (six) hours as needed for moderate pain.   levETIRAcetam 500 MG tablet Commonly known as: Keppra Take 1 tablet (500 mg total) by mouth 2 (two) times daily.   levonorgestrel 20 MCG/24HR IUD Commonly known as: MIRENA 1 Intra Uterine Device (1 each total) by Intrauterine route once.   predniSONE 10 MG tablet Commonly known as: DELTASONE Take 60mg  po daily for 2 days then 40mg  daily for 2 days then 30mg  daily for 2 days then 20mg  daily for 2 days then 10mg  daily for 2 days then stop What changed:  how much to take how to take this when to take this additional instructions         Allergies  Allergen Reactions   Penicillins Rash    Has patient had a PCN reaction causing immediate rash, facial/tongue/throat swelling, SOB or lightheadedness with hypotension: No Has patient had a PCN reaction causing severe rash involving mucus membranes or skin necrosis: No Has patient had a PCN reaction that required hospitalization No Has patient had a PCN reaction occurring within the last 10 years: No If all of the above answers are "NO", then may proceed with Cephalosporin use.    Consultations:    Procedures/Studies: DG Chest 2 View  Result Date: 11/12/2022 CLINICAL DATA:  Shortness of breath.  Possible asthma exacerbation. EXAM: CHEST - 2 VIEW COMPARISON:  Portable chest 11/09/2022 . FINDINGS: The heart size and mediastinal contours are within normal limits. Both lungs are hyperexpanded, with interval increased right infrahilar haziness which could be due to atelectasis or a small pneumonia. The remaining lungs are clear. The visualized skeletal structures are unremarkable. IMPRESSION: Both lungs are hyperexpanded, with interval increased right infrahilar haziness which could be due to atelectasis or a small pneumonia. Follow-up as indicated. Electronically Signed   By: M.D.   On: 11/12/2022 01:56   MM DIAG BREAST TOMO UNI RIGHT  Result Date: 11/11/2022 CLINICAL DATA:  Recall from screening to evaluate 2 possible right breast asymmetries. EXAM: DIGITAL DIAGNOSTIC UNILATERAL RIGHT MAMMOGRAM WITH TOMOSYNTHESIS;  ULTRASOUND RIGHT BREAST LIMITED TECHNIQUE: Right digital diagnostic mammography and breast tomosynthesis was performed.; Targeted ultrasound examination of the right breast was performed COMPARISON:  Recent screening mammogram 10/27/2022 ACR Breast Density Category b: There are scattered areas of fibroglandular density. FINDINGS: Additional spot compression tomographic images of the right breast were obtained. There is persistent, but slightly less  apparent asymmetry over the upper-outer right breast in the middle third. There is also persistent, but less apparent asymmetry over the anterior third of the upper outer right periareolar region. These findings likely due to asymmetric fibroglandular tissue. Targeted ultrasound is performed, showing patchy pattern of fibroglandular tissue over the upper-outer right breast the 10 o'clock position 3 cm from the nipple and 11 o'clock position 6 cm from the nipple likely accounting for the screening mammographic findings. IMPRESSION: No concerning findings within the right breast as the possible screening asymmetries are due to patchy asymmetric fibroglandular tissue. RECOMMENDATION: Recommend continued annual bilateral screening mammographic follow-up. I have discussed the findings and recommendations with the patient. If applicable, a reminder letter will be sent to the patient regarding the next appointment. BI-RADS CATEGORY  1: Negative. Electronically Signed   By: Elberta Fortis M.D.   On: 11/11/2022 12:56   US BREAST LTD UNI RIGHT INC AXILLA  Result Date: 11/11/2022 CLINICAL DATA:  Recall from screening to evaluate 2 possible right breast asymmetries. EXAM: DIGITAL DIAGNOSTIC UNILATERAL RIGHT MAMMOGRAM WITH TOMOSYNTHESIS; ULTRASOUND RIGHT BREAST LIMITED TECHNIQUE: Right digital diagnostic mammography and breast tomosynthesis was performed.; Targeted ultrasound examination of the right breast was performed COMPARISON:  Recent screening mammogram 10/27/2022 ACR Breast Density Category b: There are scattered areas of fibroglandular density. FINDINGS: Additional spot compression tomographic images of the right breast were obtained. There is persistent, but slightly less apparent asymmetry over the upper-outer right breast in the middle third. There is also persistent, but less apparent asymmetry over the anterior third of the upper outer right periareolar region. These findings likely due to asymmetric  fibroglandular tissue. Targeted ultrasound is performed, showing patchy pattern of fibroglandular tissue over the upper-outer right breast the 10 o'clock position 3 cm from the nipple and 11 o'clock position 6 cm from the nipple likely accounting for the screening mammographic findings. IMPRESSION: No concerning findings within the right breast as the possible screening asymmetries are due to patchy asymmetric fibroglandular tissue. RECOMMENDATION: Recommend continued annual bilateral screening mammographic follow-up. I have discussed the findings and recommendations with the patient. If applicable, a reminder letter will be sent to the patient regarding the next appointment. BI-RADS CATEGORY  1: Negative. Electronically Signed   By: Elberta Fortis M.D.   On: 11/11/2022 12:56  DG Chest Portable 1 View  Result Date: 11/09/2022 CLINICAL DATA:  Provided history: Dyspnea. EXAM: PORTABLE CHEST 1 VIEW COMPARISON:  Prior chest radiographs 12/20/2019 and earlier. FINDINGS: Heart size within normal limits. No appreciable airspace consolidation. A trace left pleural effusion is questioned. No evidence of pneumothorax. No acute bony abnormality identified. IMPRESSION: A trace left pleural effusion is questioned. Otherwise, there is no evidence of acute cardiopulmonary abnormality. Electronically Signed   By: Jackey Loge D.O.   On: 11/09/2022 08:27   EEG adult  Result Date: 10/27/2022 Van Clines, MD     11/01/2022  4:44 PM ELECTROENCEPHALOGRAM REPORT Date of Study: 10/27/2022 Patient's Name: DOMINO HOLTEN MRN: [id number] Date of Birth: 11-18-1981 Referring Provider: Dr. Patrcia Dolly Clinical History: This is a 41 year old woman with convulsions and staring episodes with head/gaze turn to  the left. EEG for classification. Medications: Keppra Prozac Technical Summary: A multichannel digital EEG recording measured by the international 10-20 system with electrodes applied with paste and impedances below 5000 ohms  performed in our laboratory with EKG monitoring in an awake and asleep patient.  Hyperventilation and photic stimulation were performed.  The digital EEG was referentially recorded, reformatted, and digitally filtered in a variety of bipolar and referential montages for optimal display.  Description: The patient is awake and asleep during the recording.  During maximal wakefulness, there is a symmetric, medium voltage 10 Hz posterior dominant rhythm that attenuates with eye opening.  The record is symmetric.  During drowsiness and sleep, there is an increase in theta slowing of the background.  Vertex waves and symmetric sleep spindles were seen. Hyperventilation and photic stimulation did not elicit any abnormalities.  There were no epileptiform discharges or electrographic seizures seen.  EKG lead was unremarkable. Impression: This  awake and asleep EEG is normal.  Clinical Correlation: A normal EEG does not exclude a clinical diagnosis of epilepsy.  If further clinical questions remain, prolonged EEG may be helpful.  Clinical correlation is advised. Patrcia Dolly, M.D.      Subjective: Overall shortness of breath and cough are better  Discharge Exam: Vitals:   11/14/22 1611 11/14/22 2010 11/15/22 0400 11/15/22 0741  BP:  138/87 114/77   Pulse: 73 84 (!) 56 64  Resp: 18 18 18 16   Temp:  98.2 F (36.8 C) (!) 97.4 F (36.3 C)   TempSrc:  Oral Oral   SpO2: 94% 97% 97% 94%  Weight:      Height:        General: Pt is alert, awake, not in acute distress Cardiovascular: RRR, S1/S2 +, no rubs, no gallops Respiratory: CTA bilaterally, no wheezing, no rhonchi Abdominal: Soft, NT, ND, bowel sounds + Extremities: no edema, no cyanosis    The results of significant diagnostics from this hospitalization (including imaging, microbiology, ancillary and laboratory) are listed below for reference.     Microbiology: Recent Results (from the past 240 hour(s))  Resp panel by RT-PCR (RSV, Flu A&B,  Covid) Anterior Nasal Swab     Status: Abnormal   Collection Time: 11/12/22  1:28 AM   Specimen: Anterior Nasal Swab  Result Value Ref Range Status   SARS Coronavirus 2 by RT PCR NEGATIVE NEGATIVE Final    Comment: (NOTE) SARS-CoV-2 target nucleic acids are NOT DETECTED.  The SARS-CoV-2 RNA is generally detectable in upper respiratory specimens during the acute phase of infection. The lowest concentration of SARS-CoV-2 viral copies this assay can detect is 138 copies/mL. A negative result does not preclude SARS-Cov-2 infection and should not be used as the sole basis for treatment or other patient management decisions. A negative result may occur with  improper specimen collection/handling, submission of specimen other than nasopharyngeal swab, presence of viral mutation(s) within the areas targeted by this assay, and inadequate number of viral copies(<138 copies/mL). A negative result must be combined with clinical observations, patient history, and epidemiological information. The expected result is Negative.  Fact Sheet for Patients:  11/14/22  Fact Sheet for Healthcare Providers:  BloggerCourse.com  This test is no t yet approved or cleared by the [place] FDA and  has been authorized for detection and/or diagnosis of SARS-CoV-2 by FDA under an Emergency Use Authorization (EUA). This EUA will remain  in effect (meaning this test can be used) for the duration of the COVID-19 declaration under Section 564(b)(1)  of the Act, 21 U.S.C.section 360bbb-3(b)(1), unless the authorization is terminated  or revoked sooner.       Influenza A by PCR NEGATIVE NEGATIVE Final   Influenza B by PCR NEGATIVE NEGATIVE Final    Comment: (NOTE) The Xpert Xpress SARS-CoV-2/FLU/RSV plus assay is intended as an aid in the diagnosis of influenza from Nasopharyngeal swab specimens and should not be used as a sole basis for treatment.  Nasal washings and aspirates are unacceptable for Xpert Xpress SARS-CoV-2/FLU/RSV testing.  Fact Sheet for Patients: BloggerCourse.com  Fact Sheet for Healthcare Providers: SeriousBroker.it  This test is not yet approved or cleared by the [place] FDA and has been authorized for detection and/or diagnosis of SARS-CoV-2 by FDA under an Emergency Use Authorization (EUA). This EUA will remain in effect (meaning this test can be used) for the duration of the COVID-19 declaration under Section 564(b)(1) of the Act, 21 U.S.C. section 360bbb-3(b)(1), unless the authorization is terminated or revoked.     Resp Syncytial Virus by PCR POSITIVE (A) NEGATIVE Final    Comment: (NOTE) Fact Sheet for Patients: BloggerCourse.com  Fact Sheet for Healthcare Providers: [employer]ious[employer]ker.it  This test is not yet approved or cleared by the [place] FDA and has been authorized for detection and/or diagnosis of SARS-CoV-2 by FDA under an Emergency Use Authorization (EUA). This EUA will remain in effect (meaning this test can be used) for the duration of the COVID-19 declaration under Section 564(b)(1) of the Act, 21 U.S.C. section 360bbb-3(b)(1), unless the authorization is terminated or revoked.  Performed at [facility], [address] [place], [place] [address]      Labs: BNP (last 3 results) Recent Labs    11/09/22 0829  BNP 67.0   Basic Metabolic Panel: Recent Labs  Lab 11/09/22 0829 11/12/22 0259 11/12/22 0600 11/13/22 0817 11/14/22 0454 11/15/22 0423  NA 135 136  --  136 137 138  K 3.5 3.5  --  4.0 4.6 4.2  CL 105 104  --  104 102 104  CO2 23 25  --  GLUCOSE 87 84  --  102* 113* 108*  BUN 9 17  --  CREATININE 0.51 0.55 0.57 0.54 0.56 0.56  CALCIUM 8.6* 8.6*  --  8.6* 8.5* 8.4*  MG  --  1.9  --  2.1 2.4 2.2  PHOS  --   --  2.3*  --  4.9* 3.9    Liver Function Tests: Recent Labs  Lab 11/09/22 0829 11/13/22 0817  AST 14* 14*  ALT 10 10  ALKPHOS 65 52  BILITOT 0.4 0.4  PROT 7.3 6.4*  ALBUMIN 3.8 3.4*   No results for input(s): "LIPASE", "AMYLASE" in the last 168 hours. No results for input(s): "AMMONIA" in the last 168 hours. CBC: Recent Labs  Lab 11/09/22 0829 11/12/22 0259 11/12/22 0600 11/13/22 0817  WBC 6.5 10.9* 11.3* 12.1*  NEUTROABS 3.4 8.5*  --   --   HGB 14.8 14.0 13.1 12.6  HCT 43.7 42.9 40.5 38.1  MCV 98.0 99.8 101.3* 98.7  PLT 298 278 282 262   Cardiac Enzymes: No results for input(s): "CKTOTAL", "CKMB", "CKMBINDEX", "TROPONINI" in the last 168 hours. BNP: Invalid input(s): "POCBNP" CBG: No results for input(s): "GLUCAP" in the last 168 hours. D-Dimer No results for input(s): "DDIMER" in the last 72 hours. Hgb A1c No results for input(s): "HGBA1C" in the last 72 hours. Lipid Profile No results for input(s): "CHOL", "HDL", "LDLCALC", "TRIG", "  CHOLHDL", "LDLDIRECT" in the last 72 hours. Thyroid function studies No results for input(s): "TSH", "T4TOTAL", "T3FREE", "THYROIDAB" in the last 72 hours.  Invalid input(s): "FREET3" Anemia work up No results for input(s): "VITAMINB12", "FOLATE", "FERRITIN", "TIBC", "IRON", "RETICCTPCT" in the last 72 hours. Urinalysis    Component Value Date/Time   COLORURINE AMBER (A) 06/29/2022 0736   APPEARANCEUR CLOUDY (A) 06/29/2022 0736   LABSPEC 1.040 (H) 06/29/2022 0736   PHURINE 5.0 06/29/2022 0736   GLUCOSEU NEGATIVE 06/29/2022 0736   HGBUR NEGATIVE 06/29/2022 0736   BILIRUBINUR NEGATIVE 06/29/2022 0736   KETONESUR 5 (A) 06/29/2022 0736   PROTEINUR 100 (A) 06/29/2022 0736   UROBILINOGEN 0.2 01/17/2015 0851   NITRITE NEGATIVE 06/29/2022 0736   LEUKOCYTESUR MODERATE (A) 06/29/2022 0736   Sepsis Labs Recent Labs  Lab 11/09/22 0829 11/12/22 0259 11/12/22 0600 11/13/22 0817  WBC 6.5 10.9* 11.3* 12.1*   Microbiology Recent Results (from the past  240 hour(s))  Resp panel by RT-PCR (RSV, Flu A&B, Covid) Anterior Nasal Swab     Status: Abnormal   Collection Time: 11/12/22  1:28 AM   Specimen: Anterior Nasal Swab  Result Value Ref Range Status   SARS Coronavirus 2 by RT PCR NEGATIVE NEGATIVE Final    Comment: (NOTE) SARS-CoV-2 target nucleic acids are NOT DETECTED.  The SARS-CoV-2 RNA is generally detectable in upper respiratory specimens during the acute phase of infection. The lowest concentration of SARS-CoV-2 viral copies this assay can detect is 138 copies/mL. A negative result does not preclude SARS-Cov-2 infection and should not be used as the sole basis for treatment or other patient management decisions. A negative result may occur with  improper specimen collection/handling, submission of specimen other than nasopharyngeal swab, presence of viral mutation(s) within the areas targeted by this assay, and inadequate number of viral copies(<138 copies/mL). A negative result must be combined with clinical observations, patient history, and epidemiological information. The expected result is Negative.  Fact Sheet for Patients:  BloggerCourse.com  Fact Sheet for Healthcare Providers:  [employer]ious[employer]ker.it  This test is no t yet approved or cleared by the [place] FDA and  has been authorized for detection and/or diagnosis of SARS-CoV-2 by FDA under an Emergency Use Authorization (EUA). This EUA will remain  in effect (meaning this test can be used) for the duration of the COVID-19 declaration under Section 564(b)(1) of the Act, 21 U.S.C.section 360bbb-3(b)(1), unless the authorization is terminated  or revoked sooner.       Influenza A by PCR NEGATIVE NEGATIVE Final   Influenza B by PCR NEGATIVE NEGATIVE Final    Comment: (NOTE) The Xpert Xpress SARS-CoV-2/FLU/RSV plus assay is intended as an aid in the diagnosis of influenza from Nasopharyngeal swab specimens  and should not be used as a sole basis for treatment. Nasal washings and aspirates are unacceptable for Xpert Xpress SARS-CoV-2/FLU/RSV testing.  Fact Sheet for Patients: BloggerCourse.com  Fact Sheet for Healthcare Providers: [employer]iousBroker.it  This test is not yet approved or cleared by the [place] FDA and has been authorized for detection and/or diagnosis of SARS-CoV-2 by FDA under an Emergency Use Authorization (EUA). This EUA will remain in effect (meaning this test can be used) for the duration of the COVID-19 declaration under Section 564(b)(1) of the Act, 21 U.S.C. section 360bbb-3(b)(1), unless the authorization is terminated or revoked.     Resp Syncytial Virus by PCR POSITIVE (A) NEGATIVE Final    Comment: (NOTE) Fact Sheet for Patients: BloggerCourse.com  Fact Sheet for Healthcare Providers:  SeriousBroker.it  This test is not yet approved or cleared by the [place] and has been authorized for detection and/or diagnosis of SARS-CoV-2 by FDA under an Emergency Use Authorization (EUA). This EUA will remain in effect (meaning this test can be used) for the duration of the COVID-19 declaration under Section 564(b)(1) of the Act, 21 U.S.C. section 360bbb-3(b)(1), unless the authorization is terminated or revoked.  Performed at [facility], [address], [place], [place] [address]      Time coordinating discharge:  SIGNED:   Erick Blinks, MD  [facility] 11/15/2022, 9:00 PM   If 7PM-7AM, please contact night-coverage [email or web address]

## 2022-11-19 ENCOUNTER — Other Ambulatory Visit: Payer: Self-pay | Admitting: Internal Medicine

## 2022-11-19 DIAGNOSIS — I10 Essential (primary) hypertension: Secondary | ICD-10-CM

## 2022-11-29 ENCOUNTER — Encounter: Payer: Self-pay | Admitting: Internal Medicine

## 2022-11-29 ENCOUNTER — Ambulatory Visit (INDEPENDENT_AMBULATORY_CARE_PROVIDER_SITE_OTHER): Payer: Commercial Managed Care - PPO | Admitting: Internal Medicine

## 2022-11-29 VITALS — BP 128/88 | HR 86 | Ht 64.0 in | Wt 126.8 lb

## 2022-11-29 DIAGNOSIS — I10 Essential (primary) hypertension: Secondary | ICD-10-CM

## 2022-11-29 DIAGNOSIS — J4520 Mild intermittent asthma, uncomplicated: Secondary | ICD-10-CM

## 2022-11-29 DIAGNOSIS — G40909 Epilepsy, unspecified, not intractable, without status epilepticus: Secondary | ICD-10-CM | POA: Diagnosis not present

## 2022-11-29 DIAGNOSIS — F411 Generalized anxiety disorder: Secondary | ICD-10-CM

## 2022-11-29 DIAGNOSIS — Z720 Tobacco use: Secondary | ICD-10-CM

## 2022-11-29 DIAGNOSIS — F331 Major depressive disorder, recurrent, moderate: Secondary | ICD-10-CM

## 2022-11-29 MED ORDER — HYDROXYZINE PAMOATE 25 MG PO CAPS: 25.0000 mg | ORAL_CAPSULE | Freq: Three times a day (TID) | ORAL | 2 refills | Status: DC | PRN

## 2022-11-29 MED ORDER — ALBUTEROL SULFATE HFA 108 (90 BASE) MCG/ACT IN AERS: 1.0000 | INHALATION_SPRAY | Freq: Four times a day (QID) | RESPIRATORY_TRACT | 3 refills | Status: DC | PRN

## 2022-11-29 NOTE — Assessment & Plan Note (Signed)
Now well controlled without any antihypertensive Used to be on controlled even with multiple antihypertensives Was likely elevated in the past due to chronic hip pain

## 2022-11-29 NOTE — Assessment & Plan Note (Addendum)
Recent asthma exacerbation due to RSV infection Usually well controlled with albuterol inhaler, refilled

## 2022-11-29 NOTE — Assessment & Plan Note (Signed)
Needs to take Keppra regularly Advised to avoid alcohol use Although her episodes of seizures were likely due to alcohol withdrawal, would prefer neurology evaluation before discontinuing Keppra

## 2022-11-29 NOTE — Assessment & Plan Note (Signed)
Smokes about 0.5 pack/day  Asked about quitting: confirms that she currently smokes cigarettes Advise to quit smoking: Educated about QUITTING to reduce the risk of cancer, cardio and cerebrovascular disease. Assess willingness: Unwilling to quit at this time, but is working on cutting back. Assist with counseling and pharmacotherapy: Counseled for 5 minutes and literature provided. Arrange for follow up: Follow up in 3 months and continue to offer help. 

## 2022-11-29 NOTE — Assessment & Plan Note (Signed)
Uncontrolled with Prozac 80 mg daily Added Vistaril as needed Was on Xanax in the past, but had BZD dependence Used to see Psychiatry - Dr Toy Care

## 2022-11-29 NOTE — Patient Instructions (Signed)
Please start taking Hydroxyzine as needed for anxiety.  Please continue taking other medications as prescribed.  Please try to cut down -> quit smoking.

## 2022-11-29 NOTE — Progress Notes (Signed)
Established Patient Office Visit  Subjective:  Patient ID: Kelsey [name], female    DOB: 02/19/1981  Age: 42 y.o. MRN: [id number]  CC:  Chief Complaint  Patient presents with   Follow-up    Patient states she has been tired lately and doesn't have any energy    HPI Kelsey [name] is a 42 y.o. female with past medical history of HTN, asthma, GERD, seizure disorder, depression with anxiety, chronic back and hip pain and tobacco abuse who presents for f/u of her chronic medical conditions.  Asthma: She she was recently admitted with RSV infection and asthma exacerbation.  She was treated with IV steroids and azithromycin followed by oral steroids and Z-Pak.  She has completed oral steroids and Z-Pak.  She denies any dyspnea or wheezing currently.  Denies any recent fever or chills.  She uses an albuterol inhaler as needed, and does not require it usually on a daily basis.  Depression with anxiety: She currently takes Prozac 80 mg daily.  She recently lost one of her close friends and has been stressed due to it.  She also recently had oral steroids, which can worsen anxiety.  She has tried multiple SSRIs and SNRIs in the past.  She denies SI or HI currently.  Seizure disorder: She still takes Keppra. She has seen neurology for it. She had normal EEG.  Seizure episodes were likely due to alcohol and BZD use.   Past Medical History:  Diagnosis Date   Anxiety    Asthma    hosp. 2018 and 2019 no h/o intubation    Depression    GERD (gastroesophageal reflux disease)    Hypertension    Palpitations    Perianal abscess 11/22/2016   12/07/2016.  [facility], [place].  [name], MD.  Perianal abscess is much better but still needs local wound care.  Continue sitz baths 2-3 times perday until all drainage and pain are gone.  Follow up as needed.   Preop general physical exam 01/26/2022   PVC (premature ventricular contraction)    Seasonal allergies    Seizures ([place])     01/15/22 was last seizure   Tachycardia     Past Surgical History:  Procedure Laterality Date   CESAREAN SECTION  10/15/2009, 02/08/2006   INCISION AND DRAINAGE PERIRECTAL ABSCESS Bilateral 11/22/2016   Procedure: IRRIGATION AND DEBRIDEMENT PERIRECTAL ABSCESS;  Surgeon: [name], MD;  Location: [facility];  Service: General;  Laterality: Bilateral;   INTRAUTERINE DEVICE INSERTION     Mirena 12/24/19 exp 12/28/21 Dr Allyn Kenner [facility]   TOTAL HIP ARTHROPLASTY Left 02/17/2022   Procedure: Left TOTAL HIP ARTHROPLASTY ANTERIOR APPROACH;  Surgeon: Mcarthur Rossetti, MD;  Location: [facility];  Service: Orthopedics;  Laterality: Left;   TOTAL HIP ARTHROPLASTY Right 05/05/2022   Procedure: RIGHT TOTAL HIP ARTHROPLASTY ANTERIOR APPROACH;  Surgeon: Mcarthur Rossetti, MD;  Location: [facility];  Service: Orthopedics;  Laterality: Right;    Family History  Problem Relation Age of Onset   Rheumatologic disease Mother    Arthritis Mother    Asthma Mother    Depression Mother    Heart disease Father    Asthma Maternal Grandmother    Hypertension Maternal Grandmother    Heart disease Maternal Grandfather    Lung cancer Paternal Grandmother    Healthy Daughter    Healthy Son     Social History   Socioeconomic History   Marital status: Married    Spouse name:  Not on file   Number of children: 2   Years of education: Not on file   Highest education level: Not on file  Occupational History   Occupation: respiratory therapists    Employer: [employer]  Tobacco Use   Smoking status: Every Day    Packs/day: 0.25    Years: 10.00    Total pack years: 2.50    Types: Cigarettes   Smokeless tobacco: Never  Vaping Use   Vaping Use: Never used  Substance and Sexual Activity   Alcohol use: Yes    Alcohol/week: 4.0 standard drinks of alcohol    Types: 4 Shots of liquor per week   Drug use: Not Currently    Types: Marijuana   Sexual activity: Not on file  Other Topics Concern    Not on file  Social History Narrative   Respiratory Therapist, 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[employer]    2 kids    Divorced but remarried since 05/2019 new last name will be Carlis Abbott   Wears seat belt, feels safe in relationship    Right handed   Social Determinants of Health   Financial Resource Strain: Not on file  Food Insecurity: No Food Insecurity (11/13/2022)   Hunger Vital Sign    Worried About Running Out of Food in the Last Year: Never true    [place] in the Last Year: Never true  Transportation Needs: No Transportation Needs (11/13/2022)   PRAPARE - Hydrologist (Medical): No    Lack of Transportation (Non-Medical): No  Physical Activity: Not on file  Stress: Not on file  Social Connections: Not on file  Intimate Partner Violence: Not At Risk (11/13/2022)   Humiliation, Afraid, Rape, and Kick questionnaire    Fear of Current or Ex-Partner: No    Emotionally Abused: No    Physically Abused: No    Sexually Abused: No    Outpatient Medications Prior to Visit  Medication Sig Dispense Refill   FLUoxetine (PROZAC) 40 MG capsule Take 2 capsules (80 mg total) by mouth daily. 180 capsule 1   levETIRAcetam (KEPPRA) 500 MG tablet Take 1 tablet (500 mg total) by mouth 2 (two) times daily. 60 tablet 11   levonorgestrel (MIRENA) 20 MCG/24HR IUD 1 Intra Uterine Device (1 each total) by Intrauterine route once. 1 each 0   albuterol (VENTOLIN HFA) 108 (90 Base) MCG/ACT inhaler Inhale 1-2 puffs into the lungs every 6 (six) hours as needed for wheezing or shortness of breath. 18 g 3   albuterol (PROVENTIL) (2.5 MG/3ML) 0.083% nebulizer solution Take 3 mLs (2.5 mg total) by nebulization every 4 (four) hours as needed for wheezing or shortness of breath. (Patient not taking: Reported on 11/29/2022) 75 mL 2   ibuprofen (ADVIL) 200 MG tablet Take 800 mg by mouth every 6 (six) hours as needed for moderate pain. (Patient not taking: Reported on 11/29/2022)      azithromycin (ZITHROMAX) 250 MG tablet Take 1 tablet (250 mg total) by mouth daily. (Patient not taking: Reported on 11/29/2022) 2 each 0   guaifenesin (ROBITUSSIN) 100 MG/5ML syrup Take 200 mg by mouth 3 (three) times daily as needed for cough. (Patient not taking: Reported on 11/29/2022)     HYDROcodone bit-homatropine (HYCODAN) 5-1.5 MG/5ML syrup Take 5 mLs by mouth every 4 (four) hours as needed for cough. (Patient not taking: Reported on 11/29/2022) 120 mL 0   metroNIDAZOLE (FLAGYL) 500 MG tablet Take 500 mg  by mouth 2 (two) times daily. (Patient not taking: Reported on 11/29/2022)     naltrexone (DEPADE) 50 MG tablet Take 50 mg by mouth daily. (Patient not taking: Reported on 11/29/2022)     predniSONE (DELTASONE) 10 MG tablet Take 44m po daily for 2 days then 431mdaily for 2 days then 3077maily for 2 days then 5m71mily for 2 days then 10mg44mly for 2 days then stop (Patient not taking: Reported on 11/29/2022) 32 tablet 0   No facility-administered medications prior to visit.    Allergies  Allergen Reactions   Penicillins Rash    Has patient had a PCN reaction causing immediate rash, facial/tongue/throat swelling, SOB or lightheadedness with hypotension: No Has patient had a PCN reaction causing severe rash involving mucus membranes or skin necrosis: No Has patient had a PCN reaction that required hospitalization No Has patient had a PCN reaction occurring within the last 10 years: No If all of the above answers are "NO", then may proceed with Cephalosporin use.    ROS Review of Systems  Constitutional:  Positive for fatigue. Negative for chills and fever.  HENT:  Negative for congestion, sinus pressure, sinus pain and sore throat.   Eyes:  Negative for pain and discharge.  Respiratory:  Negative for cough, shortness of breath and wheezing.   Cardiovascular:  Negative for chest pain and palpitations.  Gastrointestinal:  Negative for abdominal pain, diarrhea, nausea and vomiting.   Endocrine: Negative for polydipsia and polyuria.  Genitourinary:  Negative for dysuria and hematuria.  Musculoskeletal:  Positive for arthralgias and back pain. Negative for neck pain and neck stiffness.  Skin:  Negative for rash.  Neurological:  Positive for tremors and seizures. Negative for dizziness and weakness.  Psychiatric/Behavioral:  Positive for agitation and sleep disturbance. Negative for behavioral problems. The patient is nervous/anxious.       Objective:    Physical Exam Vitals reviewed.  Constitutional:      General: She is not in acute distress.    Appearance: She is not diaphoretic.  HENT:     Head: Normocephalic and atraumatic.     Nose: Nose normal.     Mouth/Throat:     Mouth: Mucous membranes are moist.  Eyes:     General: No scleral icterus.    Extraocular Movements: Extraocular movements intact.  Cardiovascular:     Rate and Rhythm: Normal rate and regular rhythm.     Pulses: Normal pulses.     Heart sounds: Normal heart sounds. No murmur heard. Pulmonary:     Breath sounds: Normal breath sounds. No wheezing or rales.  Musculoskeletal:     Cervical back: Neck supple. No tenderness.     Right lower leg: No edema.     Left lower leg: No edema.  Skin:    General: Skin is warm.     Findings: No rash.  Neurological:     General: No focal deficit present.     Mental Status: She is alert and oriented to person, place, and time.     Sensory: No sensory deficit.     Motor: Tremor (B/l hands) present. No weakness.  Psychiatric:        Mood and Affect: Mood is depressed. Affect is tearful.        Behavior: Behavior normal.     BP 128/88 (BP Location: Right Arm, Patient Position: Sitting, Cuff Size: Normal)   Pulse 86   Ht _0  (1.626 m)   Wt 126 lb  12.8 oz (57.5 kg)   SpO2 98%   BMI 21.77 kg/m  Wt Readings from Last 3 Encounters:  11/29/22 126 lb 12.8 oz (57.5 kg)  11/13/22 126 lb 15.8 oz (57.6 kg)  11/09/22 130 lb (59 kg)    Lab Results   Component Value Date   TSH 3.374 06/20/2022   Lab Results  Component Value Date   WBC 12.1 (H) 11/13/2022   HGB 12.6 11/13/2022   HCT 38.1 11/13/2022   MCV 98.7 11/13/2022   PLT 262 11/13/2022   Lab Results  Component Value Date   NA 138 11/15/2022   K 4.2 11/15/2022   CO2 29 11/15/2022   GLUCOSE 108 (H) 11/15/2022   BUN 19 11/15/2022   CREATININE 0.56 11/15/2022   BILITOT 0.4 11/13/2022   ALKPHOS 52 11/13/2022   AST 14 (L) 11/13/2022   ALT 10 11/13/2022   PROT 6.4 (L) 11/13/2022   ALBUMIN 3.4 (L) 11/13/2022   CALCIUM 8.4 (L) 11/15/2022   ANIONGAP 5 11/15/2022   EGFR 118 08/28/2022   GFR 97.90 01/30/2020   Lab Results  Component Value Date   CHOL 145 06/20/2022   Lab Results  Component Value Date   HDL 33 (L) 06/20/2022   Lab Results  Component Value Date   LDLCALC 87 06/20/2022   Lab Results  Component Value Date   TRIG 127 06/20/2022   Lab Results  Component Value Date   CHOLHDL 4.4 06/20/2022   Lab Results  Component Value Date   HGBA1C 5.0 01/19/2022      Assessment & Plan:   Problem List Items Addressed This Visit       Cardiovascular and Mediastinum   Essential hypertension    Now well controlled without any antihypertensive Used to be on controlled even with multiple antihypertensives Was likely elevated in the past due to chronic hip pain        Respiratory   Asthma    Recent asthma exacerbation due to RSV infection Usually well controlled with albuterol inhaler, refilled      Relevant Medications   albuterol (VENTOLIN HFA) 108 (90 Base) MCG/ACT inhaler     Nervous and Auditory   Seizure disorder (HCC)    Needs to take Keppra regularly Advised to avoid alcohol use Although her episodes of seizures were likely due to alcohol withdrawal, would prefer neurology evaluation before discontinuing Keppra        Other   GAD (generalized anxiety disorder) - Primary    Uncontrolled with Prozac 80 mg daily Added Vistaril as  needed Was on Xanax in the past, but had BZD dependence Used to see Psychiatry - Dr Toy Care      Relevant Medications   hydrOXYzine (VISTARIL) 25 MG capsule   Depression, major, recurrent, moderate (HCC)    Uncontrolled, but feels anxious is worsening her MDD On Prozac to 80 mg daily If persistent, will start Vraylar      Relevant Medications   hydrOXYzine (VISTARIL) 25 MG capsule   Tobacco abuse    Smokes about 0.5 pack/day  Asked about quitting: confirms that she currently smokes cigarettes Advise to quit smoking: Educated about QUITTING to reduce the risk of cancer, cardio and cerebrovascular disease. Assess willingness: Unwilling to quit at this time, but is working on cutting back. Assist with counseling and pharmacotherapy: Counseled for 5 minutes and literature provided. Arrange for follow up: Follow up in 3 months and continue to offer help.      Other Visit Diagnoses  MDD (major depressive disorder), recurrent severe, without psychosis ([place])       Relevant Medications   hydrOXYzine (VISTARIL) 25 MG capsule       Meds ordered this encounter  Medications   hydrOXYzine (VISTARIL) 25 MG capsule    Sig: Take 1 capsule (25 mg total) by mouth every 8 (eight) hours as needed.    Dispense:  90 capsule    Refill:  2   albuterol (VENTOLIN HFA) 108 (90 Base) MCG/ACT inhaler    Sig: Inhale 1-2 puffs into the lungs every 6 (six) hours as needed for wheezing or shortness of breath.    Dispense:  18 g    Refill:  3    Follow-up: Return in about 4 months (around 03/30/2023) for Annual physical.    Lindell Spar, MD

## 2022-11-29 NOTE — Assessment & Plan Note (Signed)
Uncontrolled, but feels anxious is worsening her MDD On Prozac to 80 mg daily If persistent, will start Vraylar

## 2022-11-29 NOTE — Assessment & Plan Note (Deleted)
Used to be Followed by Psychiatry - Dr Toy Care On Prozac

## 2022-12-14 ENCOUNTER — Encounter: Payer: Self-pay | Admitting: Internal Medicine

## 2022-12-22 ENCOUNTER — Other Ambulatory Visit: Payer: Self-pay | Admitting: Internal Medicine

## 2022-12-22 DIAGNOSIS — F411 Generalized anxiety disorder: Secondary | ICD-10-CM

## 2022-12-24 ENCOUNTER — Other Ambulatory Visit: Payer: Self-pay

## 2022-12-24 ENCOUNTER — Encounter (HOSPITAL_COMMUNITY): Payer: Self-pay | Admitting: Family Medicine

## 2022-12-24 ENCOUNTER — Emergency Department (HOSPITAL_COMMUNITY): Payer: Commercial Managed Care - PPO

## 2022-12-24 ENCOUNTER — Observation Stay (HOSPITAL_COMMUNITY)
Admission: EM | Admit: 2022-12-24 | Discharge: 2022-12-25 | Disposition: A | Discharge status: Home / Self Care | Payer: Commercial Managed Care - PPO | Attending: Internal Medicine | Admitting: Internal Medicine

## 2022-12-24 DIAGNOSIS — F17200 Nicotine dependence, unspecified, uncomplicated: Secondary | ICD-10-CM | POA: Diagnosis not present

## 2022-12-24 DIAGNOSIS — D72829 Elevated white blood cell count, unspecified: Secondary | ICD-10-CM | POA: Insufficient documentation

## 2022-12-24 DIAGNOSIS — Z1152 Encounter for screening for COVID-19: Secondary | ICD-10-CM | POA: Diagnosis not present

## 2022-12-24 DIAGNOSIS — J45901 Unspecified asthma with (acute) exacerbation: Secondary | ICD-10-CM

## 2022-12-24 DIAGNOSIS — F17210 Nicotine dependence, cigarettes, uncomplicated: Secondary | ICD-10-CM | POA: Insufficient documentation

## 2022-12-24 DIAGNOSIS — Z96643 Presence of artificial hip joint, bilateral: Secondary | ICD-10-CM | POA: Insufficient documentation

## 2022-12-24 DIAGNOSIS — J4520 Mild intermittent asthma, uncomplicated: Secondary | ICD-10-CM

## 2022-12-24 DIAGNOSIS — Z79899 Other long term (current) drug therapy: Secondary | ICD-10-CM | POA: Insufficient documentation

## 2022-12-24 DIAGNOSIS — I10 Essential (primary) hypertension: Secondary | ICD-10-CM | POA: Diagnosis not present

## 2022-12-24 DIAGNOSIS — J9601 Acute respiratory failure with hypoxia: Secondary | ICD-10-CM | POA: Diagnosis not present

## 2022-12-24 DIAGNOSIS — R0902 Hypoxemia: Secondary | ICD-10-CM

## 2022-12-24 DIAGNOSIS — R0602 Shortness of breath: Secondary | ICD-10-CM | POA: Diagnosis present

## 2022-12-24 LAB — CBC WITH DIFFERENTIAL/PLATELET
Abs Immature Granulocytes: 0.04 10*3/uL (ref 0.00–0.07)
Basophils Absolute: 0.1 10*3/uL (ref 0.0–0.1)
Basophils Relative: 0 %
Eosinophils Absolute: 0.3 10*3/uL (ref 0.0–0.5)
Eosinophils Relative: 2 %
HCT: 42.4 % (ref 36.0–46.0)
Hemoglobin: 14.1 g/dL (ref 12.0–15.0)
Immature Granulocytes: 0 %
Lymphocytes Relative: 14 %
Lymphs Abs: 1.7 10*3/uL (ref 0.7–4.0)
MCH: 31.8 pg (ref 26.0–34.0)
MCHC: 33.3 g/dL (ref 30.0–36.0)
MCV: 95.5 fL (ref 80.0–100.0)
Monocytes Absolute: 0.8 10*3/uL (ref 0.1–1.0)
Monocytes Relative: 7 %
Neutro Abs: 9.6 10*3/uL — ABNORMAL HIGH (ref 1.7–7.7)
Neutrophils Relative %: 77 %
Platelets: 352 10*3/uL (ref 150–400)
RBC: 4.44 MIL/uL (ref 3.87–5.11)
RDW: 12.4 % (ref 11.5–15.5)
WBC: 12.5 10*3/uL — ABNORMAL HIGH (ref 4.0–10.5)
nRBC: 0 % (ref 0.0–0.2)

## 2022-12-24 LAB — COMPREHENSIVE METABOLIC PANEL
ALT: 11 U/L (ref 0–44)
AST: 16 U/L (ref 15–41)
Albumin: 4 g/dL (ref 3.5–5.0)
Alkaline Phosphatase: 83 U/L (ref 38–126)
Anion gap: 9 (ref 5–15)
BUN: 12 mg/dL (ref 6–20)
CO2: 24 mmol/L (ref 22–32)
Calcium: 8.5 mg/dL — ABNORMAL LOW (ref 8.9–10.3)
Chloride: 103 mmol/L (ref 98–111)
Creatinine, Ser: 0.49 mg/dL (ref 0.44–1.00)
GFR, Estimated: >60 mL/min (ref >60)
Glucose, Bld: 105 mg/dL — ABNORMAL HIGH (ref 70–99)
Potassium: 3.7 mmol/L (ref 3.5–5.1)
Sodium: 136 mmol/L (ref 135–145)
Total Bilirubin: 1 mg/dL (ref 0.3–1.2)
Total Protein: 7.2 g/dL (ref 6.5–8.1)

## 2022-12-24 LAB — RESP PANEL BY RT-PCR (RSV, FLU A&B, COVID)  RVPGX2
Influenza A by PCR: NEGATIVE
Influenza B by PCR: NEGATIVE
Resp Syncytial Virus by PCR: NEGATIVE
SARS Coronavirus 2 by RT PCR: NEGATIVE

## 2022-12-24 MED ORDER — ONDANSETRON HCL 4 MG PO TABS: 4.0000 mg | ORAL_TABLET | Freq: Four times a day (QID) | ORAL | Status: DC | PRN

## 2022-12-24 MED ORDER — GUAIFENESIN 100 MG/5ML PO LIQD
5.0000 mL | ORAL | Status: DC | PRN
  Administered 2022-12-24 – 2022-12-25 (×3): 5 mL via ORAL
  Filled 2022-12-24 (×3): qty 5

## 2022-12-24 MED ORDER — PREDNISONE 20 MG PO TABS: 40.0000 mg | ORAL_TABLET | Freq: Every day | ORAL | Status: DC

## 2022-12-24 MED ORDER — LEVETIRACETAM 500 MG PO TABS
500.0000 mg | ORAL_TABLET | Freq: Two times a day (BID) | ORAL | Status: DC
  Administered 2022-12-24 – 2022-12-25 (×2): 500 mg via ORAL
  Filled 2022-12-24 (×2): qty 1

## 2022-12-24 MED ORDER — OXYCODONE HCL 5 MG PO TABS: 5.0000 mg | ORAL_TABLET | ORAL | Status: DC | PRN

## 2022-12-24 MED ORDER — MOMETASONE FURO-FORMOTEROL FUM 200-5 MCG/ACT IN AERO
2.0000 | INHALATION_SPRAY | Freq: Two times a day (BID) | RESPIRATORY_TRACT | Status: DC
  Administered 2022-12-24 – 2022-12-25 (×2): 2 via RESPIRATORY_TRACT
  Filled 2022-12-24: qty 8.8

## 2022-12-24 MED ORDER — IPRATROPIUM BROMIDE 0.02 % IN SOLN
0.5000 mg | Freq: Once | RESPIRATORY_TRACT | Status: AC
  Administered 2022-12-24: 0.5 mg via RESPIRATORY_TRACT
  Filled 2022-12-24: qty 2.5

## 2022-12-24 MED ORDER — METHYLPREDNISOLONE SODIUM SUCC 125 MG IJ SOLR
125.0000 mg | Freq: Two times a day (BID) | INTRAMUSCULAR | Status: DC
  Administered 2022-12-25: 125 mg via INTRAVENOUS
  Filled 2022-12-24: qty 2

## 2022-12-24 MED ORDER — ALBUTEROL (5 MG/ML) CONTINUOUS INHALATION SOLN
10.0000 mg/h | INHALATION_SOLUTION | Freq: Once | RESPIRATORY_TRACT | Status: AC
  Administered 2022-12-24: 10 mg/h via RESPIRATORY_TRACT

## 2022-12-24 MED ORDER — HYDROXYZINE PAMOATE 25 MG PO CAPS: 25.0000 mg | ORAL_CAPSULE | Freq: Three times a day (TID) | ORAL | Status: DC | PRN

## 2022-12-24 MED ORDER — ACETAMINOPHEN 325 MG PO TABS: 650.0000 mg | ORAL_TABLET | Freq: Four times a day (QID) | ORAL | Status: DC | PRN

## 2022-12-24 MED ORDER — ONDANSETRON HCL 4 MG/2ML IJ SOLN: 4.0000 mg | Freq: Four times a day (QID) | INTRAMUSCULAR | Status: DC | PRN

## 2022-12-24 MED ORDER — ACETAMINOPHEN 650 MG RE SUPP: 650.0000 mg | Freq: Four times a day (QID) | RECTAL | Status: DC | PRN

## 2022-12-24 MED ORDER — HEPARIN SODIUM (PORCINE) 5000 UNIT/ML IJ SOLN
5000.0000 [IU] | Freq: Three times a day (TID) | INTRAMUSCULAR | Status: DC
  Administered 2022-12-24 – 2022-12-25 (×2): 5000 [IU] via SUBCUTANEOUS
  Filled 2022-12-24 (×2): qty 1

## 2022-12-24 MED ORDER — ALBUTEROL SULFATE (2.5 MG/3ML) 0.083% IN NEBU: 2.5000 mg | INHALATION_SOLUTION | RESPIRATORY_TRACT | Status: DC | PRN

## 2022-12-24 MED ORDER — NICOTINE 21 MG/24HR TD PT24
21.0000 mg | MEDICATED_PATCH | Freq: Every day | TRANSDERMAL | Status: DC
  Filled 2022-12-24 (×2): qty 1

## 2022-12-24 MED ORDER — FLUOXETINE HCL 20 MG PO CAPS
80.0000 mg | ORAL_CAPSULE | Freq: Every day | ORAL | Status: DC
  Administered 2022-12-25: 80 mg via ORAL
  Filled 2022-12-24: qty 4

## 2022-12-24 MED ORDER — HYDROMORPHONE HCL 1 MG/ML IJ SOLN
1.0000 mg | Freq: Once | INTRAMUSCULAR | Status: DC
  Filled 2022-12-24: qty 1

## 2022-12-24 MED ORDER — METHYLPREDNISOLONE SODIUM SUCC 125 MG IJ SOLR
125.0000 mg | Freq: Once | INTRAMUSCULAR | Status: AC
  Administered 2022-12-24: 125 mg via INTRAVENOUS
  Filled 2022-12-24: qty 2

## 2022-12-24 MED ORDER — HYDROXYZINE HCL 25 MG PO TABS
25.0000 mg | ORAL_TABLET | Freq: Three times a day (TID) | ORAL | Status: DC | PRN
  Administered 2022-12-24 – 2022-12-25 (×2): 25 mg via ORAL
  Filled 2022-12-24 (×2): qty 1

## 2022-12-24 MED ORDER — MAGNESIUM SULFATE 2 GM/50ML IV SOLN
2.0000 g | Freq: Once | INTRAVENOUS | Status: AC
  Administered 2022-12-24: 2 g via INTRAVENOUS
  Filled 2022-12-24: qty 50

## 2022-12-24 MED ORDER — ALBUTEROL (5 MG/ML) CONTINUOUS INHALATION SOLN
10.0000 mg/h | INHALATION_SOLUTION | Freq: Once | RESPIRATORY_TRACT | Status: AC
  Administered 2022-12-24: 10 mg/h via RESPIRATORY_TRACT
  Filled 2022-12-24: qty 20

## 2022-12-24 MED ORDER — SODIUM CHLORIDE 0.9 % IV BOLUS
1000.0000 mL | Freq: Once | INTRAVENOUS | Status: AC
  Administered 2022-12-24: 1000 mL via INTRAVENOUS

## 2022-12-24 MED ORDER — IPRATROPIUM-ALBUTEROL 0.5-2.5 (3) MG/3ML IN SOLN
3.0000 mL | Freq: Four times a day (QID) | RESPIRATORY_TRACT | Status: DC
  Administered 2022-12-24 – 2022-12-25 (×2): 3 mL via RESPIRATORY_TRACT
  Filled 2022-12-24 (×2): qty 3

## 2022-12-24 NOTE — ED Provider Notes (Signed)
[facility] Provider Note   CSN: [id number] Arrival date & time: 12/24/22  1157     History  Chief Complaint  Patient presents with   Shortness of Breath    Kelsey [name] is a 42 y.o. female. 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  The ER today complaining of asthma this admission.  States she has been wheezing for the past several days but got worse yesterday.  She has been using her inhaler with no relief.  Denies fevers or chills.  She does admit to a dry cough.  She reports in December she was admitted for asthma exacerbation with hypoxia due to RSV infection.  She has never had to be intubated but states she has had to be on high flow oxygen in the past.   Shortness of Breath Associated symptoms: cough and wheezing   Associated symptoms: no fever        Home Medications Prior to Admission medications   Medication Sig Start Date End Date Taking? Authorizing Provider  albuterol (PROVENTIL) (2.5 MG/3ML) 0.083% nebulizer solution Take 3 mLs (2.5 mg total) by nebulization every 4 (four) hours as needed for wheezing or shortness of breath. Patient not taking: Reported on 11/29/2022 11/15/22 11/15/23  Kathie Dike, MD  albuterol (VENTOLIN HFA) 108 (90 Base) MCG/ACT inhaler Inhale 1-2 puffs into the lungs every 6 (six) hours as needed for wheezing or shortness of breath. 11/29/22   Lindell Spar, MD  FLUoxetine (PROZAC) 40 MG capsule Take 2 capsules (80 mg total) by mouth daily. 08/28/22   Lindell Spar, MD  hydrOXYzine (VISTARIL) 25 MG capsule Take 1 capsule (25 mg total) by mouth every 8 (eight) hours as needed. 11/29/22   Lindell Spar, MD  ibuprofen (ADVIL) 200 MG tablet Take 800 mg by mouth every 6 (six) hours as needed for moderate pain. Patient not taking: Reported on 11/29/2022    [provider]  levETIRAcetam (KEPPRA) 500 MG tablet Take  1 tablet (500 mg total) by mouth 2 (two) times daily. 10/04/22   Cameron Sprang, MD  levonorgestrel (MIRENA) 20 MCG/24HR IUD 1 Intra Uterine Device (1 each total) by Intrauterine route once. 07/16/15   Kerrie Buffalo, NP      Allergies    Penicillins    Review of Systems   Review of Systems  Constitutional:  Negative for fever.  Respiratory:  Positive for cough, shortness of breath and wheezing.     Physical Exam Updated Vital Signs Temp 98 F (36.7 C) (Oral)   Ht 5\' 4"  (1.626 m)   Wt 59 kg   BMI 22.31 kg/m  Physical Exam Vitals and nursing note reviewed.  Constitutional:      General: She is not in acute distress.    Appearance: She is well-developed.  HENT:     Head: Normocephalic and atraumatic.     Mouth/Throat:     Mouth: Mucous membranes are moist.  Eyes:     Conjunctiva/sclera: Conjunctivae normal.  Cardiovascular:     Rate and Rhythm: Normal rate and regular rhythm.     Heart sounds: No murmur heard. Pulmonary:     Effort: Tachypnea and respiratory distress present.     Breath sounds: Examination of the right-upper field reveals wheezing. Examination of the left-upper field reveals wheezing. Examination of the right-middle field reveals wheezing. Examination of the left-middle field reveals wheezing. Examination of  the right-lower field reveals wheezing. Examination of the left-lower field reveals wheezing. Wheezing present.  Abdominal:     Palpations: Abdomen is soft.     Tenderness: There is no abdominal tenderness.  Musculoskeletal:        General: No swelling.     Cervical back: Neck supple.  Skin:    General: Skin is warm and dry.     Capillary Refill: Capillary refill takes less than 2 seconds.  Neurological:     Mental Status: She is alert.  Psychiatric:        Mood and Affect: Mood normal.     ED Results / Procedures / Treatments   Labs (all labs ordered are listed, but only abnormal results are displayed) Labs Reviewed  RESP PANEL BY RT-PCR  (RSV, FLU A&B, COVID)  [id number]  CBC WITH DIFFERENTIAL/PLATELET  COMPREHENSIVE METABOLIC PANEL    EKG None  Radiology No results found.  Procedures Procedures    Medications Ordered in ED Medications  methylPREDNISolone sodium succinate (SOLU-MEDROL) 125 mg/2 mL injection 125 mg (has no administration in time range)  albuterol (PROVENTIL,VENTOLIN) solution continuous neb (has no administration in time range)  magnesium sulfate IVPB 2 g 50 mL (has no administration in time range)  ipratropium (ATROVENT) nebulizer solution 0.5 mg (has no administration in time range)    ED Course/ Medical Decision Making/ A&P Clinical Course as of 12/24/22 1519  Sun Dec 24, 2022  1228 Patient seen when she was placed in the room, she was satting 84% on room air, put on nasal cannula at 4 L satting 94% with this.  Will place for steroids magnesium and continuous albuterol and Atrovent.  RT called to expedite starting nebulizer treatments [CB]  10 Was halfway through second nebulizer treatment, she is feeling better although slightly jittery from the albuterol.  She is provided with a blanket and discussed that I will come back after the remainder of her albuterol was finished and we can assess her oxygen saturation on room air at that time to decide if patient can be discharged or needs to be admitted.  She is agreeable with plan of care at this time. [CB]    Clinical Course User Index [CB] Gwenevere Abbot, PA-C                                     Final Clinical Impression(s) / ED Diagnoses Final diagnoses:  None    Rx / DC Orders ED Discharge Orders     None         Darci Current 12/24/22 1610    Noemi Chapel, MD 01/04/23 [phone]

## 2022-12-24 NOTE — Assessment & Plan Note (Signed)
-  Secondary to asthma exacerbation - Wean off O2 as tolerated - Patient is slightly tachycardic, but blood pressure is stable, no calf tenderness, no increased risk and therefore PE is very low on the differential - No chest pains - RSV, COVID, flu negative

## 2022-12-24 NOTE — Assessment & Plan Note (Signed)
-  Likely triggered by viral illness - Associated leukocytosis 12.5 - Respiratory pathogen's panel pending - RSV, COVID, flu negative - 20 mg albuterol given in the ED - 2 rounds of Atrovent given in the ED as well - Continue DuoNeb scheduled - Continue as needed albuterol - Continue steroid - Continue oxygen supplementation as needed - Chest x-ray does not show lobar consolidation - Continue to monitor

## 2022-12-24 NOTE — H&P (Signed)
History and Physical    Patient: Kelsey [name] WFU:[id number] DOB: [birth year] DOA: 12/24/2022 DOS: the patient was seen and examined on 12/24/2022 PCP: Lindell Spar, MD  Patient coming from: Home  Chief Complaint:  Chief Complaint  Patient presents with   Shortness of Breath   HPI: Kelsey [name] is a 42 y.o. 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  Patient reports that she had 2 days of dyspnea.  Is been progressively worse since it started.  At the onset of symptoms she had rhinorrhea and cough.  Her cough is productive of yellow sputum.  She denies any fever.  She has been hearing herself wheeze at home.  She has been using her rescue inhaler, but ran out last night.  She used her nebulizer once yesterday and 2 times this morning.  She had very little improvement which was temporary after these treatments.  So, she came into the ED.  Patient reports that since being in the ED she feels much better.  When she first arrived she was satting 83% on room air.  She is now satting mid 90s on 3 L nasal cannula.  Patient denies any sick contacts in her personal life but does report that she works in [employer].  Patient is a current smoker and has been counseled on the importance of cessation.  Patient reports that she understands that she needs to quit and she is going to try this year.  She has no new allergens or exposures in her environment.  Patient denies chest pain.  She reports that she does have palpitations but only after receiving 20 mg of albuterol in the ED.  She has no other complaints at this time.  Patient is a current smoker.  She does not drink alcohol, does not use illicit drugs.  She is vaccinated for COVID.  Patient is full code.   Review of Systems: As mentioned in the  history of present illness. All other systems reviewed and are negative. Past Medical History:  Diagnosis Date   Anxiety    Asthma    hosp. 2018 and 2019 no h/o intubation    Depression    GERD (gastroesophageal reflux disease)    Hypertension    Palpitations    Perianal abscess 11/22/2016   12/07/2016.  [facility], [place].  [name], MD.  Perianal abscess is much better but still needs local wound care.  Continue sitz baths 2-3 times perday until all drainage and pain are gone.  Follow up as needed.   Preop general physical exam 01/26/2022   PVC (premature ventricular contraction)    Seasonal allergies    Seizures ([place])    01/15/22 was last seizure   Tachycardia    Past Surgical History:  Procedure Laterality Date   CESAREAN SECTION  10/15/2009, 02/08/2006   INCISION AND DRAINAGE PERIRECTAL ABSCESS Bilateral 11/22/2016   Procedure: IRRIGATION AND DEBRIDEMENT PERIRECTAL ABSCESS;  Surgeon: [name], MD;  Location: [facility];  Service: General;  Laterality: Bilateral;   INTRAUTERINE DEVICE INSERTION     Mirena 12/24/19 exp 12/28/21 Dr Allyn Kenner [facility]   TOTAL HIP ARTHROPLASTY Left 02/17/2022   Procedure: Left TOTAL HIP ARTHROPLASTY ANTERIOR APPROACH;  Surgeon: Mcarthur Rossetti, MD;  Location: [facility];  Service: Orthopedics;  Laterality: Left;   TOTAL HIP ARTHROPLASTY  Right 05/05/2022   Procedure: RIGHT TOTAL HIP ARTHROPLASTY ANTERIOR APPROACH;  Surgeon: Kathryne Hitch, MD;  Location: [facility];  Service: Orthopedics;  Laterality: Right;   Social History:  reports that she has been smoking cigarettes. She has a 2.50 pack-year smoking history. She has never used smokeless tobacco. She reports current alcohol use of about 4.0 standard drinks of alcohol per week. She reports that she does not currently use drugs after having used the following drugs: Marijuana.  Allergies  Allergen Reactions   Penicillins Rash    Has patient had a PCN reaction  causing immediate rash, facial/tongue/throat swelling, SOB or lightheadedness with hypotension: No Has patient had a PCN reaction causing severe rash involving mucus membranes or skin necrosis: No Has patient had a PCN reaction that required hospitalization No Has patient had a PCN reaction occurring within the last 10 years: No If all of the above answers are "NO", then may proceed with Cephalosporin use.    Family History  Problem Relation Age of Onset   Rheumatologic disease Mother    Arthritis Mother    Asthma Mother    Depression Mother    Heart disease Father    Asthma Maternal Grandmother    Hypertension Maternal Grandmother    Heart disease Maternal Grandfather    Lung cancer Paternal Grandmother    Healthy Daughter    Healthy Son     Prior to Admission medications   Medication Sig Start Date End Date Taking? Authorizing Provider  albuterol (PROVENTIL) (2.5 MG/3ML) 0.083% nebulizer solution Take 3 mLs (2.5 mg total) by nebulization every 4 (four) hours as needed for wheezing or shortness of breath. Patient not taking: Reported on 11/29/2022 11/15/22 11/15/23  Erick Blinks, MD  albuterol (VENTOLIN HFA) 108 (90 Base) MCG/ACT inhaler Inhale 1-2 puffs into the lungs every 6 (six) hours as needed for wheezing or shortness of breath. 11/29/22   Anabel Halon, MD  FLUoxetine (PROZAC) 40 MG capsule Take 2 capsules (80 mg total) by mouth daily. 08/28/22   Anabel Halon, MD  hydrOXYzine (VISTARIL) 25 MG capsule Take 1 capsule (25 mg total) by mouth every 8 (eight) hours as needed. 11/29/22   Anabel Halon, MD  ibuprofen (ADVIL) 200 MG tablet Take 800 mg by mouth every 6 (six) hours as needed for moderate pain. Patient not taking: Reported on 11/29/2022    [provider]  levETIRAcetam (KEPPRA) 500 MG tablet Take 1 tablet (500 mg total) by mouth 2 (two) times daily. 10/04/22   Van Clines, MD  levonorgestrel (MIRENA) 20 MCG/24HR IUD 1 Intra Uterine Device (1 each total) by  Intrauterine route once. 07/16/15   Adonis Brook, NP    Physical Exam: Vitals:   12/24/22 1359 12/24/22 1426 12/24/22 1500 12/24/22 1637  BP: (!) 133/91  123/68 129/79  Pulse: (!) 107  (!) 115 (!) 105  Resp: 14  15 16   Temp:    98.4 F (36.9 C)  TempSrc:    Oral  SpO2: 97% 95% 99% 96%  Weight:      Height:       1.  General: Patient lying supine in bed,  no acute distress   2. Psychiatric: Alert and oriented x 3, mood and behavior normal for situation, pleasant and cooperative with exam   3. Neurologic: Speech and language are normal, face is symmetric, moves all 4 extremities voluntarily, at baseline without acute deficits on limited exam   4. HEENMT:  Head is atraumatic, normocephalic,  pupils reactive to light, neck is supple, trachea is midline, mucous membranes are moist   5. Respiratory : Lungs have mild wheezes bilaterally, no rhonchi, no rales, no cyanosis, no increased work of breathing, maintaining oxygen sats on 3 L nasal cannula  6. Cardiovascular : Heart rate tachycardic, rhythm is regular, no murmurs, rubs or gallops, no peripheral edema, peripheral pulses palpated   7. Gastrointestinal:  Abdomen is soft, nondistended, nontender to palpation bowel sounds active, no masses or organomegaly palpated   8. Skin:  Skin is warm, dry and intact without rashes, acute lesions, or ulcers on limited exam   9.Musculoskeletal:  No acute deformities or trauma, no asymmetry in tone, no peripheral edema, peripheral pulses palpated, no tenderness to palpation in the extremities  Data Reviewed: In the ED Temp 98, heart rate 90-115, respiratory 14-15, blood pressure 123/68-159/104, satting 90-99% Negative COVID, flu, RSV Chest x-ray shows coarsened lung markings that could reflect bronchitic changes EKG has a heart rate of 92, sinus rhythm, QTc 573 Albuterol 10 mg x 2 given in the ED Atrovent x 2 given in the ED Mag sulfate 2 g given in the ED Solu-Medrol started as  well 1 L normal saline After treatments patient was feeling better and ambulation was attempted at which time she dropped back down into the mid 80s admission was requested for further management of asthma exacerbation  Assessment and Plan: * Asthma exacerbation - Likely triggered by viral illness - Associated leukocytosis 12.5 - Respiratory pathogen's panel pending - RSV, COVID, flu negative - 20 mg albuterol given in the ED - 2 rounds of Atrovent given in the ED as well - Continue DuoNeb scheduled - Continue as needed albuterol - Continue steroid - Continue oxygen supplementation as needed - Chest x-ray does not show lobar consolidation - Continue to monitor  Leukocytosis - Likely acute phase reactant secondary to asthma exacerbation and possibly to viral respiratory illness  Acute respiratory failure with hypoxia (HCC) - Secondary to asthma exacerbation - Wean off O2 as tolerated - Patient is slightly tachycardic, but blood pressure is stable, no calf tenderness, no increased risk and therefore PE is very low on the differential - No chest pains - RSV, COVID, flu negative  Tobacco use disorder - Advised on the importance of cessation - Nicotine patch ordered      Advance Care Planning:   Code Status: Full Code  Consults: None at this time  Family Communication: No family at bedside  Severity of Illness: The appropriate patient status for this patient is OBSERVATION. Observation status is judged to be reasonable and necessary in order to provide the required intensity of service to ensure the patient's safety. The patient's presenting symptoms, physical exam findings, and initial radiographic and laboratory data in the context of their medical condition is felt to place them at decreased risk for further clinical deterioration. Furthermore, it is anticipated that the patient will be medically stable for discharge from the hospital within 2 midnights of admission.    Author: Rolla Plate, DO 12/24/2022 4:44 PM  For on call review [email or web address].

## 2022-12-24 NOTE — ED Notes (Signed)
Pt SPO2 at 88% while ambulating to the bathroom. Nurse notified.

## 2022-12-24 NOTE — Assessment & Plan Note (Signed)
-  Advised on the importance of cessation -Nicotine patch ordered 

## 2022-12-24 NOTE — Assessment & Plan Note (Signed)
-  Likely acute phase reactant secondary to asthma exacerbation and possibly to viral respiratory illness

## 2022-12-24 NOTE — ED Triage Notes (Signed)
Pt c/o Sob and cough for a couple of days. Pt has asthma and has used inhaler  and neb tx without help. RA at 86%.

## 2022-12-24 NOTE — ED Notes (Signed)
Lungs have cleared up since initial medications administered

## 2022-12-25 DIAGNOSIS — J9601 Acute respiratory failure with hypoxia: Secondary | ICD-10-CM

## 2022-12-25 DIAGNOSIS — J4521 Mild intermittent asthma with (acute) exacerbation: Secondary | ICD-10-CM

## 2022-12-25 DIAGNOSIS — J45901 Unspecified asthma with (acute) exacerbation: Secondary | ICD-10-CM | POA: Diagnosis not present

## 2022-12-25 DIAGNOSIS — F17200 Nicotine dependence, unspecified, uncomplicated: Secondary | ICD-10-CM | POA: Diagnosis not present

## 2022-12-25 LAB — CBC WITH DIFFERENTIAL/PLATELET
Abs Immature Granulocytes: 0.03 10*3/uL (ref 0.00–0.07)
Basophils Absolute: 0 10*3/uL (ref 0.0–0.1)
Basophils Relative: 0 %
Eosinophils Absolute: 0 10*3/uL (ref 0.0–0.5)
Eosinophils Relative: 0 %
HCT: 39.3 % (ref 36.0–46.0)
Hemoglobin: 13 g/dL (ref 12.0–15.0)
Immature Granulocytes: 0 %
Lymphocytes Relative: 5 %
Lymphs Abs: 0.5 10*3/uL — ABNORMAL LOW (ref 0.7–4.0)
MCH: 32 pg (ref 26.0–34.0)
MCHC: 33.1 g/dL (ref 30.0–36.0)
MCV: 96.8 fL (ref 80.0–100.0)
Monocytes Absolute: 0.3 10*3/uL (ref 0.1–1.0)
Monocytes Relative: 3 %
Neutro Abs: 9.3 10*3/uL — ABNORMAL HIGH (ref 1.7–7.7)
Neutrophils Relative %: 92 %
Platelets: 328 10*3/uL (ref 150–400)
RBC: 4.06 MIL/uL (ref 3.87–5.11)
RDW: 12.5 % (ref 11.5–15.5)
WBC: 10.1 10*3/uL (ref 4.0–10.5)
nRBC: 0 % (ref 0.0–0.2)

## 2022-12-25 LAB — COMPREHENSIVE METABOLIC PANEL
ALT: 9 U/L (ref 0–44)
AST: 12 U/L — ABNORMAL LOW (ref 15–41)
Albumin: 3.4 g/dL — ABNORMAL LOW (ref 3.5–5.0)
Alkaline Phosphatase: 81 U/L (ref 38–126)
Anion gap: 7 (ref 5–15)
BUN: 13 mg/dL (ref 6–20)
CO2: 20 mmol/L — ABNORMAL LOW (ref 22–32)
Calcium: 8.5 mg/dL — ABNORMAL LOW (ref 8.9–10.3)
Chloride: 108 mmol/L (ref 98–111)
Creatinine, Ser: 0.47 mg/dL (ref 0.44–1.00)
GFR, Estimated: >60 mL/min (ref >60)
Glucose, Bld: 127 mg/dL — ABNORMAL HIGH (ref 70–99)
Potassium: 3.6 mmol/L (ref 3.5–5.1)
Sodium: 135 mmol/L (ref 135–145)
Total Bilirubin: 0.9 mg/dL (ref 0.3–1.2)
Total Protein: 6.2 g/dL — ABNORMAL LOW (ref 6.5–8.1)

## 2022-12-25 LAB — MAGNESIUM: Magnesium: 2.4 mg/dL (ref 1.7–2.4)

## 2022-12-25 MED ORDER — PREDNISONE 20 MG PO TABS: 50.0000 mg | ORAL_TABLET | Freq: Every day | ORAL | Status: DC

## 2022-12-25 MED ORDER — HYDROCOD POLI-CHLORPHE POLI ER 10-8 MG/5ML PO SUER: 5.0000 mL | Freq: Two times a day (BID) | ORAL | 0 refills | Status: DC | PRN

## 2022-12-25 MED ORDER — METHYLPREDNISOLONE SODIUM SUCC 125 MG IJ SOLR
60.0000 mg | Freq: Every day | INTRAMUSCULAR | Status: AC
  Administered 2022-12-25: 60 mg via INTRAVENOUS
  Filled 2022-12-25: qty 2

## 2022-12-25 MED ORDER — HYDROCOD POLI-CHLORPHE POLI ER 10-8 MG/5ML PO SUER
5.0000 mL | Freq: Two times a day (BID) | ORAL | Status: DC | PRN
  Filled 2022-12-25: qty 5

## 2022-12-25 MED ORDER — HYDROCODONE BIT-HOMATROP MBR 5-1.5 MG/5ML PO SOLN: 5.0000 mL | ORAL | Status: DC | PRN

## 2022-12-25 MED ORDER — LEVALBUTEROL HCL 0.63 MG/3ML IN NEBU
0.6300 mg | INHALATION_SOLUTION | Freq: Four times a day (QID) | RESPIRATORY_TRACT | Status: DC
  Administered 2022-12-25: 0.63 mg via RESPIRATORY_TRACT
  Filled 2022-12-25: qty 3

## 2022-12-25 MED ORDER — IPRATROPIUM BROMIDE 0.02 % IN SOLN
0.5000 mg | Freq: Four times a day (QID) | RESPIRATORY_TRACT | Status: DC
  Administered 2022-12-25: 0.5 mg via RESPIRATORY_TRACT
  Filled 2022-12-25: qty 2.5

## 2022-12-25 MED ORDER — ALBUTEROL SULFATE HFA 108 (90 BASE) MCG/ACT IN AERS: 1.0000 | INHALATION_SPRAY | Freq: Four times a day (QID) | RESPIRATORY_TRACT | 1 refills | Status: DC | PRN

## 2022-12-25 MED ORDER — PREDNISONE 50 MG PO TABS: 50.0000 mg | ORAL_TABLET | Freq: Every day | ORAL | 0 refills | Status: DC

## 2022-12-25 NOTE — Discharge Summary (Signed)
Physician Discharge Summary   Patient: Kelsey [name] MRN: [id number] DOB: 06-08-1981  Admit date:     12/24/2022  Discharge date: 12/25/22  Discharge Physician: Shanon Brow Eugene Isadore   PCP: Lindell Spar, MD    Please follow up with primary care provider within 1-2 weeks  Please repeat [facility]      Hospital Course: 42 year old female with a history of hypertension, hyperlipidemia, asthma, seizure disorder, anxiety, tobacco abuse, polysubstance abuse (alcohol, benzodiazepines, THC) in remission presenting with 3-day history of worsening shortness of breath.  She stated that it began on the evening of 12/21/2022 with coughing or shortness of breath that progressively worsened.  She denies any recent sick contacts.  She denies any fevers, chills, headache, chest pain, nausea, vomiting, diarrhea, abdominal pain. Notably, the patient was recently admitted to the hospital from 11/12/2022 to 11/15/2022 for acute respiratory failure secondary to RSV bronchitis resulting in asthma exacerbation.  She was discharged home with a prednisone taper. In the ED, the patient was afebrile hemodynamically stable.  Oxygen saturation was 84% room air.  Chest x-ray was negative for any infiltrates or edema.  COVID PCR, flu, and RSV were negative.  The patient was placed on 3 L nasal cannula.  She was started on bronchodilators and Solu-Medrol with clinical improvement. On the morning of 12/25/2022, the patient had significant clinical improvement.  The patient ambulated the hallways without any oxygen desaturation.  She was sent home with another prednisone taper.  Assessment and Plan: Acute Respiratory Failure with Hypoxia -due to Asthma exacerbation -Presented with tachypnea and oxygen saturation 84% on room air -initially on 3L -weaned to RA -Wean oxygen for saturation greater 92% -1/29--ambulatory pulse ox on RA did not show desaturation <94%   Acute asthma exacerbation -She only uses albuterol inhaler at  home -continue Xopenex in the setting of tachycardia with albuterol -Continue IV Solu-Medrol>>home with prednisone -add atarax for anxiety -added hycodan -RSV, COVID, Flu all neg   Tobacco abuse -She has a 10-15-pack-year history -Tobacco cessation discussed -still smokes 1/2 ppd   Seizure disorder -Follows Dr. Ellouise Newer -Continue Keppra   Anxiety -Continue fluoxetine   THC use -Risks and benefits discussed      Pain control - [facility] Controlled Substance Reporting System database was reviewed. and patient was instructed, not to drive, operate heavy machinery, perform activities at heights, swimming or participation in water activities or provide baby-sitting services while on Pain, Sleep and Anxiety Medications; until their outpatient Physician has advised to do so again. Also recommended to not to take more than prescribed Pain, Sleep and Anxiety Medications.  Consultants: none Procedures performed: none  Disposition: Home Diet recommendation:  Regular diet DISCHARGE MEDICATION: Allergies as of 12/25/2022       Reactions   Penicillins Rash   Has patient had a PCN reaction causing immediate rash, facial/tongue/throat swelling, SOB or lightheadedness with hypotension: No Has patient had a PCN reaction causing severe rash involving mucus membranes or skin necrosis: No Has patient had a PCN reaction that required hospitalization No Has patient had a PCN reaction occurring within the last 10 years: No If all of the above answers are "NO", then may proceed with Cephalosporin use.        Medication List     TAKE these medications    albuterol (2.5 MG/3ML) 0.083% nebulizer solution Commonly known as: PROVENTIL Take 3 mLs (2.5 mg total) by nebulization every 4 (four) hours as needed for wheezing or shortness of breath.   albuterol  108 (90 Base) MCG/ACT inhaler Commonly known as: VENTOLIN HFA Inhale 1-2 puffs into the lungs every 6 (six) hours as needed for  wheezing or shortness of breath.   chlorpheniramine-HYDROcodone 10-8 MG/5ML Commonly known as: TUSSIONEX Take 5 mLs by mouth every 12 (twelve) hours as needed for cough.   FLUoxetine 40 MG capsule Commonly known as: PROZAC Take 2 capsules (80 mg total) by mouth daily.   hydrOXYzine 25 MG capsule Commonly known as: VISTARIL TAKE 1 CAPSULE (25 MG TOTAL) BY MOUTH EVERY 8 (EIGHT) HOURS AS NEEDED.   ibuprofen 200 MG tablet Commonly known as: ADVIL Take 800 mg by mouth every 6 (six) hours as needed for moderate pain.   levETIRAcetam 500 MG tablet Commonly known as: Keppra Take 1 tablet (500 mg total) by mouth 2 (two) times daily.   levonorgestrel 20 MCG/24HR IUD Commonly known as: MIRENA 1 Intra Uterine Device (1 each total) by Intrauterine route once.   predniSONE 50 MG tablet Commonly known as: DELTASONE Take 1 tablet (50 mg total) by mouth daily with breakfast. Start taking on: December 26, 2022        Discharge Exam: Danley Danker Weights   12/24/22 1221  Weight: 59 kg   HEENT:  [place]/AT, No thrush, no icterus CV:  RRR, no rub, no S3, no S4 Lung:  bibasilar rales.  Minimal bibasilar wheeze Abd:  soft/+BS, NT Ext:  No edema, no lymphangitis, no synovitis, no rash   Condition at discharge: stable  The results of significant diagnostics from this hospitalization (including imaging, microbiology, ancillary and laboratory) are listed below for reference.   Imaging Studies: DG Chest Portable 1 View  Result Date: 12/24/2022 CLINICAL DATA:  Shortness of breath.  Hypoxia. EXAM: PORTABLE CHEST 1 VIEW COMPARISON:  Chest x-ray November 12, 2022 FINDINGS: No pneumothorax. Coarsened lung markings. No focal infiltrate. No overt edema. The cardiomediastinal silhouette is stable. No other acute abnormalities. IMPRESSION: Coarsened lung markings may represent bronchitic changes. No other acute abnormalities. Electronically Signed   By: Dorise Bullion III M.D.   On: 12/24/2022 12:54     Microbiology: Results for orders placed or performed during the hospital encounter of 12/24/22  Resp panel by RT-PCR (RSV, Flu A&B, Covid) Anterior Nasal Swab     Status: None   Collection Time: 12/24/22 12:22 PM   Specimen: Anterior Nasal Swab  Result Value Ref Range Status   SARS Coronavirus 2 by RT PCR NEGATIVE NEGATIVE Final    Comment: (NOTE) SARS-CoV-2 target nucleic acids are NOT DETECTED.  The SARS-CoV-2 RNA is generally detectable in upper respiratory specimens during the acute phase of infection. The lowest concentration of SARS-CoV-2 viral copies this assay can detect is 138 copies/mL. A negative result does not preclude SARS-Cov-2 infection and should not be used as the sole basis for treatment or other patient management decisions. A negative result may occur with  improper specimen collection/handling, submission of specimen other than nasopharyngeal swab, presence of viral mutation(s) within the areas targeted by this assay, and inadequate number of viral copies(<138 copies/mL). A negative result must be combined with clinical observations, patient history, and epidemiological information. The expected result is Negative.  Fact Sheet for Patients:  EntrepreneurPulse.com.au  Fact Sheet for Healthcare Providers:  IncredibleEmployment.be  This test is no t yet approved or cleared by the [place] FDA and  has been authorized for detection and/or diagnosis of SARS-CoV-2 by FDA under an Emergency Use Authorization (EUA). This EUA will remain  in effect (meaning this test can  be used) for the duration of the COVID-19 declaration under Section 564(b)(1) of the Act, 21 U.S.C.section 360bbb-3(b)(1), unless the authorization is terminated  or revoked sooner.       Influenza A by PCR NEGATIVE NEGATIVE Final   Influenza B by PCR NEGATIVE NEGATIVE Final    Comment: (NOTE) The Xpert Xpress SARS-CoV-2/FLU/RSV plus assay is intended  as an aid in the diagnosis of influenza from Nasopharyngeal swab specimens and should not be used as a sole basis for treatment. Nasal washings and aspirates are unacceptable for Xpert Xpress SARS-CoV-2/FLU/RSV testing.  Fact Sheet for Patients: BloggerCourse.com  Fact Sheet for Healthcare Providers: SeriousBroker.it  This test is not yet approved or cleared by the [place] FDA and has been authorized for detection and/or diagnosis of SARS-CoV-2 by FDA under an Emergency Use Authorization (EUA). This EUA will remain in effect (meaning this test can be used) for the duration of the COVID-19 declaration under Section 564(b)(1) of the Act, 21 U.S.C. section 360bbb-3(b)(1), unless the authorization is terminated or revoked.     Resp Syncytial Virus by PCR NEGATIVE NEGATIVE Final    Comment: (NOTE) Fact Sheet for Patients: BloggerCourse.com  Fact Sheet for Healthcare Providers: SeriousBroker.it  This test is not yet approved or cleared by the [place] FDA and has been authorized for detection and/or diagnosis of SARS-CoV-2 by FDA under an Emergency Use Authorization (EUA). This EUA will remain in effect (meaning this test can be used) for the duration of the COVID-19 declaration under Section 564(b)(1) of the Act, 21 U.S.C. section 360bbb-3(b)(1), unless the authorization is terminated or revoked.  Performed at [facility], [address] [place], [place] [address]     Labs: CBC: Recent Labs  Lab 12/24/22 1232 12/25/22 0504  WBC 12.5* 10.1  NEUTROABS 9.6* 9.3*  HGB 14.1 13.0  HCT 42.4 39.3  MCV 95.5 96.8  PLT 352 328   Basic Metabolic Panel: Recent Labs  Lab 12/24/22 1232 12/25/22 0504  NA 136 135  K 3.7 3.6  CL 103 108  CO2 24 20*  GLUCOSE 105* 127*  BUN 12 13  CREATININE 0.49 0.47  CALCIUM 8.5* 8.5*  MG  --  2.4   Liver Function Tests: Recent  Labs  Lab 12/24/22 1232 12/25/22 0504  AST 16 12*  ALT 11 9  ALKPHOS 83 81  BILITOT 1.0 0.9  PROT 7.2 6.2*  ALBUMIN 4.0 3.4*   CBG: No results for input(s): "GLUCAP" in the last 168 hours.  Discharge time spent: greater than 30 minutes.  Signed: Catarina Hartshorn, MD [facility] 12/25/2022

## 2022-12-25 NOTE — TOC Progression Note (Signed)
  Transition of Care [facility]) Screening Note   Patient Details  Name: Kelsey [name] Date of Birth: Nov 13, 1981   [facility] CM/SW Contact:    Shade Flood, LCSW Phone Number: 12/25/2022, 9:53 AM    Transition of Care Department [facility]) has reviewed patient and no TOC needs have been identified at this time. We will continue to monitor patient advancement through interdisciplinary progression rounds. If new patient transition needs arise, please place a TOC consult.

## 2022-12-25 NOTE — Progress Notes (Signed)
[facility] Discharge Note  Admit Date:  12/24/2022 Discharge date: 12/25/2022   Rebecca Eaton to be D/C'd Home per MD order.  AVS completed. Patient/caregiver able to verbalize understanding.  Discharge Medication: Allergies as of 12/25/2022       Reactions   Penicillins Rash   Has patient had a PCN reaction causing immediate rash, facial/tongue/throat swelling, SOB or lightheadedness with hypotension: No Has patient had a PCN reaction causing severe rash involving mucus membranes or skin necrosis: No Has patient had a PCN reaction that required hospitalization No Has patient had a PCN reaction occurring within the last 10 years: No If all of the above answers are "NO", then may proceed with Cephalosporin use.        Medication List     TAKE these medications    albuterol (2.5 MG/3ML) 0.083% nebulizer solution Commonly known as: PROVENTIL Take 3 mLs (2.5 mg total) by nebulization every 4 (four) hours as needed for wheezing or shortness of breath.   albuterol 108 (90 Base) MCG/ACT inhaler Commonly known as: VENTOLIN HFA Inhale 1-2 puffs into the lungs every 6 (six) hours as needed for wheezing or shortness of breath.   chlorpheniramine-HYDROcodone 10-8 MG/5ML Commonly known as: TUSSIONEX Take 5 mLs by mouth every 12 (twelve) hours as needed for cough.   FLUoxetine 40 MG capsule Commonly known as: PROZAC Take 2 capsules (80 mg total) by mouth daily.   hydrOXYzine 25 MG capsule Commonly known as: VISTARIL TAKE 1 CAPSULE (25 MG TOTAL) BY MOUTH EVERY 8 (EIGHT) HOURS AS NEEDED.   ibuprofen 200 MG tablet Commonly known as: ADVIL Take 800 mg by mouth every 6 (six) hours as needed for moderate pain.   levETIRAcetam 500 MG tablet Commonly known as: Keppra Take 1 tablet (500 mg total) by mouth 2 (two) times daily.   levonorgestrel 20 MCG/24HR IUD Commonly known as: MIRENA 1 Intra Uterine Device (1 each total) by Intrauterine route once.   predniSONE 50 MG tablet Commonly  known as: DELTASONE Take 1 tablet (50 mg total) by mouth daily with breakfast. Start taking on: December 26, 2022        Discharge Assessment: Vitals:   12/25/22 0506 12/25/22 0711  BP: 129/83   Pulse: 88   Resp: 16   Temp: 98.2 F (36.8 C)   SpO2: 96% 96%   Skin clean, dry and intact without evidence of skin break down, no evidence of skin tears noted. IV catheter discontinued intact. Site without signs and symptoms of complications - no redness or edema noted at insertion site, patient denies c/o pain - only slight tenderness at site.  Dressing with slight pressure applied.  D/c Instructions-Education: Discharge instructions given to patient/family with verbalized understanding. D/c education completed with patient/family including follow up instructions, medication list, d/c activities limitations if indicated, with other d/c instructions as indicated by MD - patient able to verbalize understanding, all questions fully answered. Patient instructed to return to ED, call 911, or call MD for any changes in condition.  Patient escorted via [place], and D/C home via private auto.  Tsosie Billing, LPN 1/61/0960 45:40 AM

## 2022-12-25 NOTE — Hospital Course (Signed)
42 year old female with a history of hypertension, hyperlipidemia, asthma, seizure disorder, anxiety, tobacco abuse, polysubstance abuse (alcohol, benzodiazepines, THC) in remission presenting with 3-day history of worsening shortness of breath.  She stated that it began on the evening of 12/21/2022 with coughing or shortness of breath that progressively worsened.  She denies any recent sick contacts.  She denies any fevers, chills, headache, chest pain, nausea, vomiting, diarrhea, abdominal pain. Notably, the patient was recently admitted to the hospital from 11/12/2022 to 11/15/2022 for acute respiratory failure secondary to RSV bronchitis resulting in asthma exacerbation.  She was discharged home with a prednisone taper. In the ED, the patient was afebrile hemodynamically stable.  Oxygen saturation was 84% room air.  Chest x-ray was negative for any infiltrates or edema.  COVID PCR, flu, and RSV were negative.  The patient was placed on 3 L nasal cannula.  She was started on bronchodilators and Solu-Medrol with clinical improvement. On the morning of 12/25/2022, the patient had significant clinical improvement.  The patient ambulated the hallways without any oxygen desaturation.  She was sent home with another prednisone taper.

## 2022-12-26 ENCOUNTER — Telehealth: Payer: Self-pay

## 2022-12-26 NOTE — Telephone Encounter (Signed)
Transition Care Management Unsuccessful Follow-up Telephone Call  Date of discharge and from where:  [facility] 12/25/2022  Attempts:  1st Attempt  Reason for unsuccessful TCM follow-up call:  Left voice message Juanda Crumble, [place] Direct Dial [phone]

## 2022-12-27 NOTE — Telephone Encounter (Signed)
Transition Care Management Unsuccessful Follow-up Telephone Call  Date of discharge and from where:  [facility] 12/25/2022  Attempts:  2nd Attempt  Reason for unsuccessful TCM follow-up call:  Left voice message Juanda Crumble, [place] Direct Dial [phone]

## 2022-12-28 NOTE — Telephone Encounter (Signed)
Transition Care Management Unsuccessful Follow-up Telephone Call  Date of discharge and from where:  [facility] 12/25/2022  Attempts:  3rd Attempt  Reason for unsuccessful TCM follow-up call:  Left voice message Juanda Crumble, [place] Direct Dial [phone]

## 2023-01-09 ENCOUNTER — Ambulatory Visit: Payer: Commercial Managed Care - PPO | Admitting: Neurology

## 2023-01-25 ENCOUNTER — Encounter: Payer: Self-pay | Admitting: Radiology

## 2023-02-01 ENCOUNTER — Encounter: Payer: Self-pay | Admitting: Radiology

## 2023-02-24 ENCOUNTER — Other Ambulatory Visit: Payer: Self-pay | Admitting: Internal Medicine

## 2023-02-24 DIAGNOSIS — F331 Major depressive disorder, recurrent, moderate: Secondary | ICD-10-CM

## 2023-02-24 DIAGNOSIS — F411 Generalized anxiety disorder: Secondary | ICD-10-CM

## 2023-04-11 ENCOUNTER — Ambulatory Visit (INDEPENDENT_AMBULATORY_CARE_PROVIDER_SITE_OTHER): Payer: Commercial Managed Care - PPO | Admitting: Internal Medicine

## 2023-04-11 ENCOUNTER — Encounter: Payer: Self-pay | Admitting: Internal Medicine

## 2023-04-11 VITALS — BP 138/84 | HR 79 | Ht 64.0 in | Wt 144.4 lb

## 2023-04-11 DIAGNOSIS — R7303 Prediabetes: Secondary | ICD-10-CM

## 2023-04-11 DIAGNOSIS — Z0001 Encounter for general adult medical examination with abnormal findings: Secondary | ICD-10-CM | POA: Diagnosis not present

## 2023-04-11 DIAGNOSIS — J4520 Mild intermittent asthma, uncomplicated: Secondary | ICD-10-CM

## 2023-04-11 DIAGNOSIS — F331 Major depressive disorder, recurrent, moderate: Secondary | ICD-10-CM | POA: Diagnosis not present

## 2023-04-11 DIAGNOSIS — F411 Generalized anxiety disorder: Secondary | ICD-10-CM

## 2023-04-11 DIAGNOSIS — E782 Mixed hyperlipidemia: Secondary | ICD-10-CM

## 2023-04-11 DIAGNOSIS — Z23 Encounter for immunization: Secondary | ICD-10-CM

## 2023-04-11 DIAGNOSIS — I10 Essential (primary) hypertension: Secondary | ICD-10-CM | POA: Diagnosis not present

## 2023-04-11 DIAGNOSIS — E559 Vitamin D deficiency, unspecified: Secondary | ICD-10-CM

## 2023-04-11 DIAGNOSIS — Z720 Tobacco use: Secondary | ICD-10-CM

## 2023-04-11 DIAGNOSIS — G40909 Epilepsy, unspecified, not intractable, without status epilepticus: Secondary | ICD-10-CM

## 2023-04-11 MED ORDER — GABAPENTIN 300 MG PO CAPS: 300.0000 mg | ORAL_CAPSULE | Freq: Two times a day (BID) | ORAL | 2 refills | Status: DC

## 2023-04-11 MED ORDER — AIRSUPRA 90-80 MCG/ACT IN AERO: 2.0000 | INHALATION_SPRAY | Freq: Four times a day (QID) | RESPIRATORY_TRACT | 5 refills | Status: DC | PRN

## 2023-04-11 NOTE — Assessment & Plan Note (Signed)
Now well controlled without any antihypertensive Used to be on controlled even with multiple antihypertensives Was likely elevated in the past due to chronic hip pain 

## 2023-04-11 NOTE — Patient Instructions (Signed)
Please start taking Gabapentin 300 mg twice daily.  Please start using AirSupra as needed for shortness of breath or wheezing.  Please continue to take medications as prescribed.  Please continue to follow low salt diet and perform moderate exercise/walking at least 150 mins/week.

## 2023-04-11 NOTE — Assessment & Plan Note (Signed)
Smokes about 0.5 pack/day  Asked about quitting: confirms that she currently smokes cigarettes Advise to quit smoking: Educated about QUITTING to reduce the risk of cancer, cardio and cerebrovascular disease. Assess willingness: Unwilling to quit at this time, but is working on cutting back. Assist with counseling and pharmacotherapy: Counseled for 5 minutes and literature provided. Arrange for follow up: Follow up in 3 months and continue to offer help. 

## 2023-04-11 NOTE — Assessment & Plan Note (Addendum)
Usually well controlled with albuterol inhaler, but had 2 episodes of asthma exacerbations recently Started AirSupra - sample provided

## 2023-04-12 LAB — CBC WITH DIFFERENTIAL/PLATELET
Basophils Absolute: 0.1 10*3/uL (ref 0.0–0.2)
Basos: 1 %
EOS (ABSOLUTE): 0.6 10*3/uL — ABNORMAL HIGH (ref 0.0–0.4)
Eos: 8 %
Hematocrit: 43.1 % (ref 34.0–46.6)
Hemoglobin: 14.2 g/dL (ref 11.1–15.9)
Immature Grans (Abs): 0 10*3/uL (ref 0.0–0.1)
Immature Granulocytes: 0 %
Lymphocytes Absolute: 2.5 10*3/uL (ref 0.7–3.1)
Lymphs: 35 %
MCH: 30.9 pg (ref 26.6–33.0)
MCHC: 32.9 g/dL (ref 31.5–35.7)
MCV: 94 fL (ref 79–97)
Monocytes Absolute: 0.6 10*3/uL (ref 0.1–0.9)
Monocytes: 8 %
Neutrophils Absolute: 3.6 10*3/uL (ref 1.4–7.0)
Neutrophils: 48 %
Platelets: 359 10*3/uL (ref 150–450)
RBC: 4.59 x10E6/uL (ref 3.77–5.28)
RDW: 12 % (ref 11.7–15.4)
WBC: 7.3 10*3/uL (ref 3.4–10.8)

## 2023-04-12 LAB — CMP14+EGFR
ALT: 8 IU/L (ref 0–32)
AST: 14 IU/L (ref 0–40)
Albumin/Globulin Ratio: 1.6 (ref 1.2–2.2)
Albumin: 4.2 g/dL (ref 3.9–4.9)
Alkaline Phosphatase: 85 IU/L (ref 44–121)
BUN/Creatinine Ratio: 20 (ref 9–23)
BUN: 11 mg/dL (ref 6–24)
Bilirubin Total: <0.2 mg/dL (ref 0.0–1.2)
CO2: 21 mmol/L (ref 20–29)
Calcium: 9 mg/dL (ref 8.7–10.2)
Chloride: 103 mmol/L (ref 96–106)
Creatinine, Ser: 0.55 mg/dL — ABNORMAL LOW (ref 0.57–1.00)
Globulin, Total: 2.7 g/dL (ref 1.5–4.5)
Glucose: 76 mg/dL (ref 70–99)
Potassium: 4.4 mmol/L (ref 3.5–5.2)
Sodium: 139 mmol/L (ref 134–144)
Total Protein: 6.9 g/dL (ref 6.0–8.5)
eGFR: 117 mL/min/{1.73_m2} (ref >59)

## 2023-04-12 LAB — TSH: TSH: 4.9 u[IU]/mL — ABNORMAL HIGH (ref 0.450–4.500)

## 2023-04-12 LAB — LIPID PANEL
Chol/HDL Ratio: 3.3 ratio (ref 0.0–4.4)
Cholesterol, Total: 163 mg/dL (ref 100–199)
HDL: 49 mg/dL (ref >39)
LDL Chol Calc (NIH): 88 mg/dL (ref 0–99)
Triglycerides: 150 mg/dL — ABNORMAL HIGH (ref 0–149)
VLDL Cholesterol Cal: 26 mg/dL (ref 5–40)

## 2023-04-12 LAB — HEMOGLOBIN A1C
Est. average glucose Bld gHb Est-mCnc: 105 mg/dL
Hgb A1c MFr Bld: 5.3 % (ref 4.8–5.6)

## 2023-04-12 LAB — VITAMIN D 25 HYDROXY (VIT D DEFICIENCY, FRACTURES): Vit D, 25-Hydroxy: 25.5 ng/mL — ABNORMAL LOW (ref 30.0–100.0)

## 2023-04-13 ENCOUNTER — Encounter: Payer: Self-pay | Admitting: Internal Medicine

## 2023-04-13 DIAGNOSIS — Z0001 Encounter for general adult medical examination with abnormal findings: Secondary | ICD-10-CM | POA: Insufficient documentation

## 2023-04-13 NOTE — Assessment & Plan Note (Signed)

## 2023-04-13 NOTE — Assessment & Plan Note (Signed)
Needs to take Keppra regularly Advised to avoid alcohol use Although her episodes of seizures were likely due to alcohol withdrawal, would prefer neurology evaluation before discontinuing Keppra 

## 2023-04-13 NOTE — Assessment & Plan Note (Signed)
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

## 2023-04-13 NOTE — Assessment & Plan Note (Signed)
Lipid profile reviewed Had started Crestor, but did not continue due to myalgias

## 2023-04-13 NOTE — Assessment & Plan Note (Signed)
Uncontrolled with Prozac 80 mg daily Added gabapentin 300 mg twice daily for now Was on Xanax in the past, but had BZD dependence Used to see Psychiatry - Dr Evelene Croon

## 2023-04-13 NOTE — Progress Notes (Signed)
Established Patient Office Visit  Subjective:  Patient ID: DEJIA DOXSEE, female    DOB: 28-Oct-1981  Age: 42 y.o. MRN: [id number]  CC:  Chief Complaint  Patient presents with   Annual Exam    Patient would like a different anxiety medication     HPI Kelsey [name] is a 42 y.o. female with past medical history of HTN, asthma, GERD, seizure disorder, depression with anxiety, chronic back and hip pain and tobacco abuse who presents for annual physical.  Asthma: She she was admitted with asthma exacerbation in 01/24.  She was treated with IV steroids followed by oral steroids. She denies any dyspnea or wheezing currently.  Denies any recent fever or chills.  She uses an albuterol inhaler as needed, and does not require it usually on a daily basis, but has had 2 asthma exacerbations in the last 1 year.   Depression with anxiety: She currently takes Prozac 80 mg daily.  She has been feeling anxious and tired most of the day.  She used to take Xanax in the past, but had withdrawal symptoms from it.  She also had good response with gabapentin, but was on high doses up to 900 mg TID. She has tried multiple SSRIs and SNRIs in the past.  She denies SI or HI currently.  Seizure disorder: She has stopped taking Keppra as she was feeling drowsy with it. She has seen neurology for it and was advised to continue Keppra. She had normal EEG.  Seizure episodes were likely due to alcohol and BZD use.   Past Medical History:  Diagnosis Date   Anxiety    Asthma    hosp. 2018 and 2019 no h/o intubation    Depression    GERD (gastroesophageal reflux disease)    Hypertension    Palpitations    Perianal abscess 11/22/2016   12/07/2016.  [facility], [place].  Ovidio Kin, MD.  Perianal abscess is much better but still needs local wound care.  Continue sitz baths 2-3 times perday until all drainage and pain are gone.  Follow up as needed.   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   Procedure: Left TOTAL HIP ARTHROPLASTY ANTERIOR APPROACH;  Surgeon: Kathryne Hitch, MD;  Location: [facility];  Service: Orthopedics;  Laterality: Left;   TOTAL HIP ARTHROPLASTY Right 05/05/2022   Procedure: RIGHT TOTAL HIP ARTHROPLASTY ANTERIOR APPROACH;  Surgeon: Kathryne Hitch, MD;  Location: [facility];  Service: Orthopedics;  Laterality: Right;    Family History  Problem Relation Age of Onset   Rheumatologic disease Mother    Arthritis Mother    Asthma Mother    Depression Mother    Heart disease Father    Asthma Maternal Grandmother    Hypertension Maternal Grandmother    Heart disease Maternal Grandfather    Lung cancer Paternal Grandmother    Healthy Daughter    Healthy  Son     Social History   Socioeconomic History   Marital status: Married    Spouse name: Not on file   Number of children: 2   Years of education: Not on file   Highest education level: Not on file  Occupational History   Occupation: respiratory therapists    Employer: [employer]  Tobacco Use   Smoking status: Every Day    Packs/day: 0.25    Years: 10.00    Additional pack years: 0.00    Total pack years: 2.50    Types: Cigarettes   Smokeless tobacco: Never  Vaping Use   Vaping Use: Never used  Substance and Sexual Activity   Alcohol use: Yes    Alcohol/week: 4.0 standard drinks of alcohol    Types: 4 Shots of  liquor per week   Drug use: Not Currently    Types: Marijuana   Sexual activity: Not on file  Other Topics Concern   Not on file  Social History Narrative   Respiratory Therapist, 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    College ed    2 kids    Divorced but remarried since 05/2019 new last name will be Chestine Spore   Wears seat belt, feels safe in relationship    Right handed   Social Determinants of Health   Financial Resource Strain: Not on file  Food Insecurity: No Food Insecurity (12/24/2022)   Hunger Vital Sign    Worried About Running Out of Food in the Last Year: Never true    Ran Out of Food in the Last Year: Never true  Transportation Needs: No Transportation Needs (12/24/2022)   PRAPARE - Administrator, Civil Service (Medical): No    Lack of Transportation (Non-Medical): No  Physical Activity: Not on file  Stress: Not on file  Social Connections: Not on file  Intimate Partner Violence: Not At Risk (12/24/2022)   Humiliation, Afraid, Rape, and Kick questionnaire    Fear of Current or Ex-Partner: No    Emotionally Abused: No    Physically Abused: No    Sexually Abused: No    Outpatient Medications Prior to Visit  Medication Sig Dispense Refill   albuterol (PROVENTIL) (2.5 MG/3ML) 0.083% nebulizer solution Take 3 mLs (2.5 mg total) by nebulization every 4 (four) hours as needed for wheezing or shortness of breath. 75 mL 2   FLUoxetine (PROZAC) 40 MG capsule TAKE 2 CAPSULES BY MOUTH DAILY 180 capsule 1   hydrOXYzine (VISTARIL) 25 MG capsule TAKE 1 CAPSULE (25 MG TOTAL) BY MOUTH EVERY 8 (EIGHT) HOURS AS NEEDED. 270 capsule 1   ibuprofen (ADVIL) 200 MG tablet Take 800 mg by mouth every 6 (six) hours as needed for moderate pain.     levETIRAcetam (KEPPRA) 500 MG tablet Take 1 tablet (500 mg total) by mouth 2 (two) times daily. (Patient not taking: Reported on 12/24/2022) 60 tablet 11   levonorgestrel (MIRENA) 20 MCG/24HR IUD 1 Intra Uterine Device (1 each total) by Intrauterine  route once. 1 each 0   albuterol (VENTOLIN HFA) 108 (90 Base) MCG/ACT inhaler Inhale 1-2 puffs into the lungs every 6 (six) hours as needed for wheezing or shortness of breath. 18 g 1   chlorpheniramine-HYDROcodone (TUSSIONEX) 10-8 MG/5ML Take 5 mLs by mouth every 12 (twelve) hours as needed for cough. 60 mL 0   predniSONE (DELTASONE) 50 MG tablet Take 1 tablet (50 mg total) by mouth daily with breakfast. 5 tablet 0   No facility-administered medications prior to  visit.    Allergies  Allergen Reactions   Penicillins Rash    Has patient had a PCN reaction causing immediate rash, facial/tongue/throat swelling, SOB or lightheadedness with hypotension: No Has patient had a PCN reaction causing severe rash involving mucus membranes or skin necrosis: No Has patient had a PCN reaction that required hospitalization No Has patient had a PCN reaction occurring within the last 10 years: No If all of the above answers are "NO", then may proceed with Cephalosporin use.    ROS Review of Systems  Constitutional:  Positive for fatigue. Negative for chills and fever.  HENT:  Negative for congestion, sinus pressure, sinus pain and sore throat.   Eyes:  Negative for pain and discharge.  Respiratory:  Negative for cough, shortness of breath and wheezing.   Cardiovascular:  Negative for chest pain and palpitations.  Gastrointestinal:  Negative for abdominal pain, diarrhea, nausea and vomiting.  Endocrine: Negative for polydipsia and polyuria.  Genitourinary:  Negative for dysuria and hematuria.  Musculoskeletal:  Positive for arthralgias and back pain. Negative for neck pain and neck stiffness.  Skin:  Negative for rash.  Neurological:  Negative for dizziness and weakness.  Psychiatric/Behavioral:  Positive for agitation and sleep disturbance. Negative for behavioral problems. The patient is nervous/anxious.       Objective:    Physical Exam Vitals reviewed.  Constitutional:      General: She is  not in acute distress.    Appearance: She is not diaphoretic.  HENT:     Head: Normocephalic and atraumatic.     Nose: Nose normal.     Mouth/Throat:     Mouth: Mucous membranes are moist.  Eyes:     General: No scleral icterus.    Extraocular Movements: Extraocular movements intact.  Cardiovascular:     Rate and Rhythm: Normal rate and regular rhythm.     Pulses: Normal pulses.     Heart sounds: Normal heart sounds. No murmur heard. Pulmonary:     Breath sounds: Normal breath sounds. No wheezing or rales.  Abdominal:     Palpations: Abdomen is soft.     Tenderness: There is no abdominal tenderness.  Musculoskeletal:     Cervical back: Neck supple. No tenderness.     Right lower leg: No edema.     Left lower leg: No edema.  Skin:    General: Skin is warm.     Findings: No rash.  Neurological:     General: No focal deficit present.     Mental Status: She is alert and oriented to person, place, and time.     Sensory: No sensory deficit.     Motor: Tremor (B/l hands) present. No weakness.  Psychiatric:        Mood and Affect: Mood is depressed.        Behavior: Behavior normal.     BP 138/84 (BP Location: Right Arm)   Pulse 79   Ht 5\' 4"  (1.626 m)   Wt 144 lb 6.4 oz (65.5 kg)   SpO2 94%   BMI 24.79 kg/m  Wt Readings from Last 3 Encounters:  04/11/23 144 lb 6.4 oz (65.5 kg)  12/24/22 130 lb (59 kg)  11/29/22 126 lb 12.8 oz (57.5 kg)    Lab Results  Component Value Date   TSH 4.900 (H) 04/11/2023   Lab Results  Component Value Date   WBC 7.3 04/11/2023   HGB 14.2 04/11/2023   HCT 43.1 04/11/2023   MCV 94 04/11/2023  PLT 359 04/11/2023   Lab Results  Component Value Date   NA 139 04/11/2023   K 4.4 04/11/2023   CO2 21 04/11/2023   GLUCOSE 76 04/11/2023   BUN 11 04/11/2023   CREATININE 0.55 (L) 04/11/2023   BILITOT <0.2 04/11/2023   ALKPHOS 85 04/11/2023   AST 14 04/11/2023   ALT 8 04/11/2023   PROT 6.9 04/11/2023   ALBUMIN 4.2 04/11/2023    CALCIUM 9.0 04/11/2023   ANIONGAP 7 12/25/2022   EGFR 117 04/11/2023   GFR 97.90 01/30/2020   Lab Results  Component Value Date   CHOL 163 04/11/2023   Lab Results  Component Value Date   HDL 49 04/11/2023   Lab Results  Component Value Date   LDLCALC 88 04/11/2023   Lab Results  Component Value Date   TRIG 150 (H) 04/11/2023   Lab Results  Component Value Date   CHOLHDL 3.3 04/11/2023   Lab Results  Component Value Date   HGBA1C 5.3 04/11/2023      Assessment & Plan:   Problem List Items Addressed This Visit       Cardiovascular and Mediastinum   Essential hypertension    Now well controlled without any antihypertensive Used to be on controlled even with multiple antihypertensives Was likely elevated in the past due to chronic hip pain      Relevant Orders   CMP14+EGFR (Completed)   CBC with Differential/Platelet (Completed)     Respiratory   Asthma    Usually well controlled with albuterol inhaler, but had 2 episodes of asthma exacerbations recently Started AirSupra - sample provided      Relevant Medications   Albuterol-Budesonide (AIRSUPRA) 90-80 MCG/ACT AERO     Nervous and Auditory   Seizure disorder (HCC)    Needs to take Keppra regularly Advised to avoid alcohol use Although her episodes of seizures were likely due to alcohol withdrawal, would prefer neurology evaluation before discontinuing Keppra      Relevant Medications   gabapentin (NEURONTIN) 300 MG capsule     Other   GAD (generalized anxiety disorder) (Chronic)    Uncontrolled with Prozac 80 mg daily Added gabapentin 300 mg twice daily for now Was on Xanax in the past, but had BZD dependence Used to see Psychiatry - Dr Evelene Croon      Depression, major, recurrent, moderate (HCC) (Chronic)    Uncontrolled, but feels anxious - worsening her MDD On Prozac to 80 mg daily Added gabapentin 300 mg twice daily for resistant depression      Relevant Medications   gabapentin (NEURONTIN)  300 MG capsule   Other Relevant Orders   TSH (Completed)   CMP14+EGFR (Completed)   Tobacco abuse    Smokes about 0.5 pack/day  Asked about quitting: confirms that she currently smokes cigarettes Advise to quit smoking: Educated about QUITTING to reduce the risk of cancer, cardio and cerebrovascular disease. Assess willingness: Unwilling to quit at this time, but is working on cutting back. Assist with counseling and pharmacotherapy: Counseled for 5 minutes and literature provided. Arrange for follow up: Follow up in 3 months and continue to offer help.      Mixed hyperlipidemia    Lipid profile reviewed Had started Crestor, but did not continue due to myalgias      Relevant Orders   Lipid panel (Completed)   Encounter for general adult medical examination with abnormal findings - Primary    Physical exam as documented. Counseling done  re healthy lifestyle involving commitment  to 150 minutes exercise per week, heart healthy diet, and attaining healthy weight.The importance of adequate sleep also discussed. Changes in health habits are decided on by the patient with goals and time frames  set for achieving them. Immunization and cancer screening needs are specifically addressed at this visit.      Other Visit Diagnoses     Vitamin D deficiency       Relevant Orders   VITAMIN D 25 Hydroxy (Vit-D Deficiency, Fractures) (Completed)   Prediabetes       Relevant Orders   Hemoglobin A1c (Completed)   Encounter for immunization       Relevant Orders   Tdap vaccine greater than or equal to 7yo IM (Completed)       Meds ordered this encounter  Medications   gabapentin (NEURONTIN) 300 MG capsule    Sig: Take 1 capsule (300 mg total) by mouth 2 (two) times daily.    Dispense:  60 capsule    Refill:  2   Albuterol-Budesonide (AIRSUPRA) 90-80 MCG/ACT AERO    Sig: Inhale 2 puffs into the lungs every 6 (six) hours as needed.    Dispense:  10.7 g    Refill:  5    Follow-up:  Return in about 3 months (around 07/12/2023) for HTN and MDD.    Anabel Halon, MD

## 2023-04-25 ENCOUNTER — Other Ambulatory Visit: Payer: Self-pay | Admitting: Internal Medicine

## 2023-04-25 DIAGNOSIS — J4520 Mild intermittent asthma, uncomplicated: Secondary | ICD-10-CM

## 2023-05-10 ENCOUNTER — Other Ambulatory Visit: Payer: Self-pay | Admitting: Internal Medicine

## 2023-05-10 ENCOUNTER — Encounter: Payer: Self-pay | Admitting: Internal Medicine

## 2023-05-10 DIAGNOSIS — F331 Major depressive disorder, recurrent, moderate: Secondary | ICD-10-CM

## 2023-05-10 MED ORDER — GABAPENTIN 300 MG PO CAPS: 600.0000 mg | ORAL_CAPSULE | Freq: Three times a day (TID) | ORAL | 3 refills | Status: DC

## 2023-06-24 ENCOUNTER — Other Ambulatory Visit: Payer: Self-pay | Admitting: Internal Medicine

## 2023-06-24 DIAGNOSIS — F411 Generalized anxiety disorder: Secondary | ICD-10-CM

## 2023-07-12 ENCOUNTER — Ambulatory Visit: Payer: Commercial Managed Care - PPO | Admitting: Internal Medicine

## 2023-07-16 ENCOUNTER — Encounter: Payer: Self-pay | Admitting: Internal Medicine

## 2023-07-16 ENCOUNTER — Ambulatory Visit (INDEPENDENT_AMBULATORY_CARE_PROVIDER_SITE_OTHER): Payer: Commercial Managed Care - PPO | Admitting: Internal Medicine

## 2023-07-16 VITALS — BP 135/89 | HR 74 | Ht 64.0 in | Wt 148.0 lb

## 2023-07-16 DIAGNOSIS — F331 Major depressive disorder, recurrent, moderate: Secondary | ICD-10-CM

## 2023-07-16 DIAGNOSIS — G40909 Epilepsy, unspecified, not intractable, without status epilepticus: Secondary | ICD-10-CM

## 2023-07-16 DIAGNOSIS — I10 Essential (primary) hypertension: Secondary | ICD-10-CM | POA: Diagnosis not present

## 2023-07-16 DIAGNOSIS — F411 Generalized anxiety disorder: Secondary | ICD-10-CM | POA: Diagnosis not present

## 2023-07-16 DIAGNOSIS — F1021 Alcohol dependence, in remission: Secondary | ICD-10-CM

## 2023-07-16 DIAGNOSIS — E038 Other specified hypothyroidism: Secondary | ICD-10-CM

## 2023-07-16 DIAGNOSIS — J4520 Mild intermittent asthma, uncomplicated: Secondary | ICD-10-CM

## 2023-07-16 NOTE — Assessment & Plan Note (Addendum)
Well controlled with gabapentin 600 mg 3 times daily for now Was on Xanax in the past, but had BZD dependence Used to see Psychiatry - Dr Evelene Croon

## 2023-07-16 NOTE — Assessment & Plan Note (Signed)
BP: 135/89  Now well controlled without any antihypertensive Used to be uncontrolled even with multiple antihypertensives Was likely elevated in the past due to chronic hip pain

## 2023-07-16 NOTE — Assessment & Plan Note (Addendum)
Usually well controlled with AirSupra inhaler, but had 2 episodes of asthma exacerbations in last 1 year before starting Airsupra Uses AirSupra about twice in a week

## 2023-07-16 NOTE — Patient Instructions (Signed)
Please continue to take medications as prescribed. ? ?Please continue to follow low carb diet and perform moderate exercise/walking at least 150 mins/week. ?

## 2023-07-16 NOTE — Assessment & Plan Note (Addendum)
Lab Results  Component Value Date   TSH 4.900 (H) 04/11/2023   Recheck TSH and free T4 Asymptomatic currently

## 2023-07-16 NOTE — Progress Notes (Signed)
Established Patient Office Visit  Subjective:  Patient ID: Kelsey [name], female    DOB: 22-Sep-1981  Age: 42 y.o. MRN: [id number]  CC:  Chief Complaint  Patient presents with   Hypertension    Three month follow up     HPI Kelsey [name] is a 42 y.o. female with past medical history of HTN, asthma, GERD, seizure disorder, depression with anxiety, chronic back and hip pain and tobacco abuse who presents for f/u of her chronic medical conditions.  Asthma: She had better response with Airsupra.  She was admitted with asthma exacerbation in 01/24.  She was treated with IV steroids followed by oral steroids. She denies any dyspnea or wheezing currently.  Denies any recent fever or chills.  She uses an albuterol inhaler as needed, and does not require it usually on a daily basis, but has had 2 asthma exacerbations in the last 1 year.  Depression with anxiety: She currently takes gabapentin 600 mg 3 times daily.  She has been feeling better now and has stopped taking Prozac 80 mg daily as it was not helping much. She used to take Xanax in the past, but had withdrawal symptoms from it.  She also had good response with gabapentin, but was on high doses up to 900 mg TID. She has tried multiple SSRIs and SNRIs in the past.  She denies SI or HI currently.   Seizure disorder: She has stopped taking Keppra as she was feeling drowsy with it. She has seen neurology for it and was advised to continue Keppra. She had normal EEG.  Seizure episodes were likely due to alcohol and BZD use.  Past Medical History:  Diagnosis Date   Anxiety    Asthma    hosp. 2018 and 2019 no h/o intubation    Depression    GERD (gastroesophageal reflux disease)    Hypertension    Palpitations    Perianal abscess 11/22/2016   12/07/2016.  [facility], [place].  Ovidio Kin, MD.  Perianal abscess is much better but still needs local wound care.  Continue sitz baths 2-3 times perday until all drainage and pain  are gone.  Follow up as needed.   Preop general physical exam 01/26/2022   PVC (premature ventricular contraction)    Seasonal allergies    Seizures (HCC)    01/15/22 was last seizure   Tachycardia     Past Surgical History:  Procedure Laterality Date   CESAREAN SECTION  10/15/2009, 02/08/2006   INCISION AND DRAINAGE PERIRECTAL ABSCESS Bilateral 11/22/2016   Procedure: IRRIGATION AND DEBRIDEMENT PERIRECTAL ABSCESS;  Surgeon: Ovidio Kin, MD;  Location: [facility];  Service: General;  Laterality: Bilateral;   INTRAUTERINE DEVICE INSERTION     Mirena 12/24/19 exp 12/28/21 Dr Philip Aspen [facility]   TOTAL HIP ARTHROPLASTY Left 02/17/2022   Procedure: Left TOTAL HIP ARTHROPLASTY ANTERIOR APPROACH;  Surgeon: Kathryne Hitch, MD;  Location: [facility];  Service: Orthopedics;  Laterality: Left;   TOTAL HIP ARTHROPLASTY Right 05/05/2022   Procedure: RIGHT TOTAL HIP ARTHROPLASTY ANTERIOR APPROACH;  Surgeon: Kathryne Hitch, MD;  Location: [facility];  Service: Orthopedics;  Laterality: Right;    Family History  Problem Relation Age of Onset   Rheumatologic disease Mother    Arthritis Mother    Asthma Mother    Depression Mother    Heart disease Father    Asthma Maternal Grandmother    Hypertension Maternal Grandmother    Heart disease Maternal Grandfather  Lung cancer Paternal Grandmother    Healthy Daughter    Healthy Son     Social History   Socioeconomic History   Marital status: Married    Spouse name: Not on file   Number of children: 2   Years of education: Not on file   Highest education level: Associate degree: academic program  Occupational History   Occupation: respiratory therapists    Employer: [employer]  Tobacco Use   Smoking status: Every Day    Current packs/day: 0.25    Average packs/day: 0.3 packs/day for 10.0 years (2.5 ttl pk-yrs)    Types: Cigarettes   Smokeless tobacco: Never  Vaping Use   Vaping status: Never Used  Substance and  Sexual Activity   Alcohol use: Yes    Alcohol/week: 4.0 standard drinks of alcohol    Types: 4 Shots of liquor per week   Drug use: Not Currently    Types: Marijuana   Sexual activity: Not on file  Other Topics Concern   Not on file  Social History Narrative   Respiratory Therapist, 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    College ed    2 kids    Divorced but remarried since 05/2019 new last name will be Chestine Spore   Wears seat belt, feels safe in relationship    Right handed   Social Determinants of Health   Financial Resource Strain: Low Risk  (07/15/2023)   Overall Financial Resource Strain (CARDIA)    Difficulty of Paying Living Expenses: Not very hard  Food Insecurity: No Food Insecurity (07/15/2023)   Hunger Vital Sign    Worried About Running Out of Food in the Last Year: Never true    Ran Out of Food in the Last Year: Never true  Transportation Needs: No Transportation Needs (07/15/2023)   PRAPARE - Administrator, Civil Service (Medical): No    Lack of Transportation (Non-Medical): No  Physical Activity: Unknown (07/15/2023)   Exercise Vital Sign    Days of Exercise per Week: Patient declined    Minutes of Exercise per Session: Not on file  Stress: No Stress Concern Present (07/15/2023)   [employer] of Occupational Health - Occupational Stress Questionnaire    Feeling of Stress : Only a little  Social Connections: Moderately Isolated (07/15/2023)   Social Connection and Isolation Panel [NHANES]    Frequency of Communication with Friends and Family: Once a week    Frequency of Social Gatherings with Friends and Family: Once a week    Attends Religious Services: 1 to 4 times per year    Active Member of [employer] or Organizations: No    Attends Engineer, structural: Not on file    Marital Status: Married  Catering manager Violence: Not At Risk (12/24/2022)   Humiliation, Afraid, Rape, and Kick questionnaire    Fear of Current or Ex-Partner: No    Emotionally  Abused: No    Physically Abused: No    Sexually Abused: No    Outpatient Medications Prior to Visit  Medication Sig Dispense Refill   albuterol (PROVENTIL) (2.5 MG/3ML) 0.083% nebulizer solution Take 3 mLs (2.5 mg total) by nebulization every 4 (four) hours as needed for wheezing or shortness of breath. 75 mL 2   Albuterol-Budesonide (AIRSUPRA) 90-80 MCG/ACT AERO Inhale 2 puffs into the lungs every 6 (six) hours as needed. 10.7 g 5   gabapentin (NEURONTIN) 300 MG capsule Take 2 capsules (600 mg total) by mouth 3 (three) times daily. 180 capsule  3   hydrOXYzine (VISTARIL) 25 MG capsule TAKE 1 CAPSULE (25 MG TOTAL) BY MOUTH EVERY 8 (EIGHT) HOURS AS NEEDED. 270 capsule 1   ibuprofen (ADVIL) 200 MG tablet Take 800 mg by mouth every 6 (six) hours as needed for moderate pain.     levETIRAcetam (KEPPRA) 500 MG tablet Take 1 tablet (500 mg total) by mouth 2 (two) times daily. (Patient not taking: Reported on 12/24/2022) 60 tablet 11   levonorgestrel (MIRENA) 20 MCG/24HR IUD 1 Intra Uterine Device (1 each total) by Intrauterine route once. 1 each 0   albuterol (VENTOLIN HFA) 108 (90 Base) MCG/ACT inhaler INHALE 1-2 PUFFS BY MOUTH EVERY 6 HOURS AS NEEDED FOR WHEEZE OR SHORTNESS OF BREATH 8.5 each 3   FLUoxetine (PROZAC) 40 MG capsule TAKE 2 CAPSULES BY MOUTH DAILY 180 capsule 1   No facility-administered medications prior to visit.    Allergies  Allergen Reactions   Penicillins Rash    Has patient had a PCN reaction causing immediate rash, facial/tongue/throat swelling, SOB or lightheadedness with hypotension: No Has patient had a PCN reaction causing severe rash involving mucus membranes or skin necrosis: No Has patient had a PCN reaction that required hospitalization No Has patient had a PCN reaction occurring within the last 10 years: No If all of the above answers are "NO", then may proceed with Cephalosporin use.    ROS Review of Systems  Constitutional:  Negative for chills and fever.   HENT:  Negative for congestion, sinus pressure, sinus pain and sore throat.   Eyes:  Negative for pain and discharge.  Respiratory:  Negative for cough, shortness of breath and wheezing.   Cardiovascular:  Negative for chest pain and palpitations.  Gastrointestinal:  Negative for abdominal pain, diarrhea, nausea and vomiting.  Endocrine: Negative for polydipsia and polyuria.  Genitourinary:  Negative for dysuria and hematuria.  Musculoskeletal:  Negative for neck pain and neck stiffness.  Skin:  Negative for rash.  Neurological:  Negative for dizziness and weakness.  Psychiatric/Behavioral:  Negative for agitation and behavioral problems. The patient is nervous/anxious.       Objective:    Physical Exam Vitals reviewed.  Constitutional:      General: She is not in acute distress.    Appearance: She is not diaphoretic.  HENT:     Head: Normocephalic and atraumatic.     Nose: Nose normal.     Mouth/Throat:     Mouth: Mucous membranes are moist.  Eyes:     General: No scleral icterus.    Extraocular Movements: Extraocular movements intact.  Cardiovascular:     Rate and Rhythm: Normal rate and regular rhythm.     Pulses: Normal pulses.     Heart sounds: Normal heart sounds. No murmur heard. Pulmonary:     Breath sounds: Normal breath sounds. No wheezing or rales.  Musculoskeletal:     Cervical back: Neck supple. No tenderness.     Right lower leg: No edema.     Left lower leg: No edema.  Skin:    General: Skin is warm.     Findings: No rash.  Neurological:     General: No focal deficit present.     Mental Status: She is alert and oriented to person, place, and time.     Sensory: No sensory deficit.     Motor: Tremor (B/l hands) present. No weakness.  Psychiatric:        Mood and Affect: Mood normal.  Behavior: Behavior normal.     BP 135/89 (BP Location: Right Arm, Patient Position: Sitting, Cuff Size: Normal)   Pulse 74   Ht 5\' 4"  (1.626 m)   Wt 148 lb  (67.1 kg)   SpO2 98%   BMI 25.40 kg/m  Wt Readings from Last 3 Encounters:  07/16/23 148 lb (67.1 kg)  04/11/23 144 lb 6.4 oz (65.5 kg)  12/24/22 130 lb (59 kg)    Lab Results  Component Value Date   TSH 2.070 07/16/2023   Lab Results  Component Value Date   WBC 7.3 04/11/2023   HGB 14.2 04/11/2023   HCT 43.1 04/11/2023   MCV 94 04/11/2023   PLT 359 04/11/2023   Lab Results  Component Value Date   NA 139 04/11/2023   K 4.4 04/11/2023   CO2 21 04/11/2023   GLUCOSE 76 04/11/2023   BUN 11 04/11/2023   CREATININE 0.55 (L) 04/11/2023   BILITOT <0.2 04/11/2023   ALKPHOS 85 04/11/2023   AST 14 04/11/2023   ALT 8 04/11/2023   PROT 6.9 04/11/2023   ALBUMIN 4.2 04/11/2023   CALCIUM 9.0 04/11/2023   ANIONGAP 7 12/25/2022   EGFR 117 04/11/2023   GFR 97.90 01/30/2020   Lab Results  Component Value Date   CHOL 163 04/11/2023   Lab Results  Component Value Date   HDL 49 04/11/2023   Lab Results  Component Value Date   LDLCALC 88 04/11/2023   Lab Results  Component Value Date   TRIG 150 (H) 04/11/2023   Lab Results  Component Value Date   CHOLHDL 3.3 04/11/2023   Lab Results  Component Value Date   HGBA1C 5.3 04/11/2023      Assessment & Plan:   Problem List Items Addressed This Visit       Cardiovascular and Mediastinum   Essential hypertension    BP: 135/89  Now well controlled without any antihypertensive Used to be uncontrolled even with multiple antihypertensives Was likely elevated in the past due to chronic hip pain        Respiratory   Asthma    Usually well controlled with AirSupra inhaler, but had 2 episodes of asthma exacerbations in last 1 year before starting Airsupra Uses AirSupra about twice in a week        Endocrine   Subclinical hypothyroidism    Lab Results  Component Value Date   TSH 4.900 (H) 04/11/2023   Recheck TSH and free T4 Asymptomatic currently      Relevant Orders   TSH + free T4 (Completed)     Nervous  and Auditory   Seizure disorder (HCC)    Needs to take Keppra regularly Advised to avoid alcohol use Although her episodes of seizures were likely due to alcohol withdrawal, would prefer neurology evaluation before discontinuing Keppra        Other   GAD (generalized anxiety disorder) (Chronic)    Well controlled with gabapentin 600 mg 3 times daily for now Was on Xanax in the past, but had BZD dependence Used to see Psychiatry - Dr Evelene Croon      Depression, major, recurrent, moderate (HCC) - Primary (Chronic)    Well-controlled, with gabapentin 600 mg 3 times daily Had anxiety, which was worsening her MDD DC Prozac to 80 mg daily as she is doing better with gabapentin alone      Alcohol dependence in remission (HCC)    Was in rehab facility for alcohol dependence Has had delirium tremens Had  Vivitrol injection Now in remission        No orders of the defined types were placed in this encounter.   Follow-up: Return in about 4 months (around 11/15/2023) for MDD.    Anabel Halon, MD

## 2023-07-16 NOTE — Assessment & Plan Note (Signed)
Needs to take Keppra regularly Advised to avoid alcohol use Although her episodes of seizures were likely due to alcohol withdrawal, would prefer neurology evaluation before discontinuing Keppra 

## 2023-07-16 NOTE — Assessment & Plan Note (Addendum)
Well-controlled, with gabapentin 600 mg 3 times daily Had anxiety, which was worsening her MDD DC Prozac to 80 mg daily as she is doing better with gabapentin alone

## 2023-07-17 LAB — TSH+FREE T4
Free T4: 0.96 ng/dL (ref 0.82–1.77)
TSH: 2.07 u[IU]/mL (ref 0.450–4.500)

## 2023-07-20 NOTE — Assessment & Plan Note (Addendum)
Was in rehab facility for alcohol dependence Has had delirium tremens Had Vivitrol injection Now in remission

## 2023-08-11 ENCOUNTER — Other Ambulatory Visit: Payer: Self-pay | Admitting: Internal Medicine

## 2023-08-11 DIAGNOSIS — F331 Major depressive disorder, recurrent, moderate: Secondary | ICD-10-CM

## 2023-09-07 ENCOUNTER — Other Ambulatory Visit: Payer: Self-pay | Admitting: Internal Medicine

## 2023-09-07 DIAGNOSIS — J4520 Mild intermittent asthma, uncomplicated: Secondary | ICD-10-CM

## 2023-10-31 ENCOUNTER — Other Ambulatory Visit (INDEPENDENT_AMBULATORY_CARE_PROVIDER_SITE_OTHER): Payer: Commercial Managed Care - PPO

## 2023-10-31 ENCOUNTER — Encounter: Payer: Self-pay | Admitting: Orthopaedic Surgery

## 2023-10-31 ENCOUNTER — Ambulatory Visit (INDEPENDENT_AMBULATORY_CARE_PROVIDER_SITE_OTHER): Payer: Commercial Managed Care - PPO | Admitting: Orthopaedic Surgery

## 2023-10-31 DIAGNOSIS — M5441 Lumbago with sciatica, right side: Secondary | ICD-10-CM | POA: Diagnosis not present

## 2023-10-31 MED ORDER — METHYLPREDNISOLONE 4 MG PO TABS: ORAL_TABLET | ORAL | 0 refills | Status: DC

## 2023-10-31 MED ORDER — TIZANIDINE HCL 4 MG PO TABS: 4.0000 mg | ORAL_TABLET | Freq: Three times a day (TID) | ORAL | 1 refills | Status: DC | PRN

## 2023-10-31 MED ORDER — TRAMADOL HCL 50 MG PO TABS: 50.0000 mg | ORAL_TABLET | Freq: Four times a day (QID) | ORAL | 0 refills | Status: DC | PRN

## 2023-10-31 NOTE — Progress Notes (Signed)
The patient is well-known to me.  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  Last Thursday and over the weekend she started develop right-sided low back pain and bilateral sciatica.  It started to radiate down her right leg.  She is a Engineer, civil (consulting) and said that she had an incident where her leg just went out from under her.  She reports discomfort with her lumbar spine.  Overall she does appear uncomfortable.  She has limited flexion extension of her lumbar spine.  She has a positive straight leg raise on the right side.  Both her hips move smoothly and fluidly.  There is no issues around the SI joints but again she has a lot of paraspinal muscle pain in the lower lumbar spine.  It does radiate into the sciatic areas more on the right than the left.  An AP and lateral lumbar spine shows no acute findings.  There is loss of lumbar lordosis certainly suggesting pain and splinting.  There is a slight degenerative scoliosis.  This is only slight.  I would like to try a 6-day steroid taper with a Medrol Dosepak as well as some Zanaflex and tramadol.  She will alternate ice and heat.  We will hold on sending her to physical therapy unless things are not getting better for her.  Another consideration if there is worsening is a MRI of the lumbar spine.  She will contact us if things are getting worse for her.  All questions and concerns were answered and addressed.

## 2023-11-12 ENCOUNTER — Other Ambulatory Visit: Payer: Self-pay

## 2023-11-12 ENCOUNTER — Encounter: Payer: Self-pay | Admitting: Internal Medicine

## 2023-11-12 DIAGNOSIS — F331 Major depressive disorder, recurrent, moderate: Secondary | ICD-10-CM

## 2023-11-12 MED ORDER — GABAPENTIN 300 MG PO CAPS: 600.0000 mg | ORAL_CAPSULE | Freq: Three times a day (TID) | ORAL | 0 refills | Status: DC

## 2023-11-15 ENCOUNTER — Ambulatory Visit: Payer: Commercial Managed Care - PPO | Admitting: Internal Medicine

## 2023-12-03 ENCOUNTER — Other Ambulatory Visit: Payer: Self-pay | Admitting: Internal Medicine

## 2023-12-03 DIAGNOSIS — J4520 Mild intermittent asthma, uncomplicated: Secondary | ICD-10-CM

## 2023-12-24 ENCOUNTER — Ambulatory Visit: Payer: Commercial Managed Care - PPO | Admitting: Internal Medicine

## 2023-12-25 ENCOUNTER — Encounter: Payer: Self-pay | Admitting: Internal Medicine

## 2023-12-25 ENCOUNTER — Ambulatory Visit (INDEPENDENT_AMBULATORY_CARE_PROVIDER_SITE_OTHER): Payer: Commercial Managed Care - PPO | Admitting: Internal Medicine

## 2023-12-25 VITALS — BP 133/84 | HR 91 | Ht 64.0 in | Wt 143.4 lb

## 2023-12-25 DIAGNOSIS — E559 Vitamin D deficiency, unspecified: Secondary | ICD-10-CM

## 2023-12-25 DIAGNOSIS — F411 Generalized anxiety disorder: Secondary | ICD-10-CM

## 2023-12-25 DIAGNOSIS — I10 Essential (primary) hypertension: Secondary | ICD-10-CM

## 2023-12-25 DIAGNOSIS — E782 Mixed hyperlipidemia: Secondary | ICD-10-CM

## 2023-12-25 DIAGNOSIS — F331 Major depressive disorder, recurrent, moderate: Secondary | ICD-10-CM

## 2023-12-25 DIAGNOSIS — F1021 Alcohol dependence, in remission: Secondary | ICD-10-CM

## 2023-12-25 DIAGNOSIS — R739 Hyperglycemia, unspecified: Secondary | ICD-10-CM

## 2023-12-25 DIAGNOSIS — E038 Other specified hypothyroidism: Secondary | ICD-10-CM

## 2023-12-25 MED ORDER — GABAPENTIN 300 MG PO CAPS: 600.0000 mg | ORAL_CAPSULE | Freq: Three times a day (TID) | ORAL | 1 refills | Status: DC

## 2023-12-25 MED ORDER — HYDROXYZINE PAMOATE 25 MG PO CAPS: 25.0000 mg | ORAL_CAPSULE | Freq: Three times a day (TID) | ORAL | 1 refills | Status: DC | PRN

## 2023-12-25 NOTE — Progress Notes (Signed)
Established Patient Office Visit  Subjective:  Patient ID: Kelsey [name], female    DOB: 1981/09/28  Age: 43 y.o. MRN: [id number]  CC:  Chief Complaint  Patient presents with   Hypertension   Depression    HPI Kelsey [name] is a 43 y.o. female with past medical history of HTN, asthma, GERD, seizure disorder, depression with anxiety, chronic back and hip pain and tobacco abuse who presents for f/u of her chronic medical conditions.  Asthma: She had better response with Airsupra.  She was admitted with asthma exacerbation in 01/24.  She was treated with IV steroids followed by oral steroids. She denies any dyspnea or wheezing currently.  Denies any recent fever or chills.  She uses an albuterol inhaler as needed, and does not require it usually on a daily basis.  Depression with anxiety: She currently takes gabapentin 600 mg 3 times daily.  She has been feeling better now. She has episodes of anxiety, which are manageable with Vistaril. She used to take Xanax in the past, but had withdrawal symptoms from it. She has tried multiple SSRIs and SNRIs in the past.  She denies SI or HI currently.  Seizure disorder: She has stopped taking Keppra as she was feeling drowsy with it. She has seen neurology for it and was advised to continue Keppra. She had normal EEG.  Seizure episodes were likely due to alcohol and BZD use.  Past Medical History:  Diagnosis Date   Anxiety    Asthma    hosp. 2018 and 2019 no h/o intubation    Depression    GERD (gastroesophageal reflux disease)    Hypertension    Palpitations    Perianal abscess 11/22/2016   12/07/2016.  [name], [place].  Kelsey [name].  Perianal abscess is much better but still needs local wound care.  Continue sitz baths 2-3 times perday until all drainage and pain are gone.  Follow up as needed.   Preop general physical exam 01/26/2022   PVC (premature ventricular contraction)    Seasonal allergies    Seizures (HCC)     01/15/22 was last seizure   Tachycardia     Past Surgical History:  Procedure Laterality Date   CESAREAN SECTION  10/15/2009, 02/08/2006   INCISION AND DRAINAGE PERIRECTAL ABSCESS Bilateral 11/22/2016   Procedure: IRRIGATION AND DEBRIDEMENT PERIRECTAL ABSCESS;  Surgeon: Kelsey [name];  Location: [facility];  Service: General;  Laterality: Bilateral;   INTRAUTERINE DEVICE INSERTION     Mirena 12/24/19 exp 12/28/21 Kelsey [name]   TOTAL HIP ARTHROPLASTY Left 02/17/2022   Procedure: Left TOTAL HIP ARTHROPLASTY ANTERIOR APPROACH;  Surgeon: Kelsey [name];  Location: [facility];  Service: Orthopedics;  Laterality: Left;   TOTAL HIP ARTHROPLASTY Right 05/05/2022   Procedure: RIGHT TOTAL HIP ARTHROPLASTY ANTERIOR APPROACH;  Surgeon: Kelsey [name];  Location: [facility];  Service: Orthopedics;  Laterality: Right;    Family History  Problem Relation Age of Onset   Rheumatologic disease Mother    Arthritis Mother    Asthma Mother    Depression Mother    Heart disease Father    Asthma Maternal Grandmother    Hypertension Maternal Grandmother    Heart disease Maternal Grandfather    Lung cancer Paternal Grandmother    Healthy Daughter    Healthy Son     Social History   Socioeconomic History   Marital status: Married    Spouse name: Not on file  Number of children: 2   Years of education: Not on file   Highest education level: Associate degree: academic program  Occupational History   Occupation: respiratory therapists    Employer: [employer]  Tobacco Use   Smoking status: Every Day    Current packs/day: 0.25    Average packs/day: 0.3 packs/day for 10.0 years (2.5 ttl pk-yrs)    Types: Cigarettes   Smokeless tobacco: Never  Vaping Use   Vaping status: Never Used  Substance and Sexual Activity   Alcohol use: Yes    Alcohol/week: 4.0 standard drinks of alcohol    Types: 4 Shots of liquor per week   Drug use: Not Currently    Types:  Marijuana   Sexual activity: Not on file  Other Topics Concern   Not on file  Social History Narrative   Respiratory Therapist, 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    College ed    2 kids    Divorced but remarried since 05/2019 new last name will be Kelsey [name]   Wears seat belt, feels safe in relationship    Right handed   Social Drivers of Health   Financial Resource Strain: Low Risk  (07/15/2023)   Overall Financial Resource Strain (CARDIA)    Difficulty of Paying Living Expenses: Not very hard  Food Insecurity: No Food Insecurity (07/15/2023)   Hunger Vital Sign    Worried About Running Out of Food in the Last Year: Never true    Ran Out of Food in the Last Year: Never true  Transportation Needs: No Transportation Needs (07/15/2023)   PRAPARE - Administrator, Civil Service (Medical): No    Lack of Transportation (Non-Medical): No  Physical Activity: Unknown (07/15/2023)   Exercise Vital Sign    Days of Exercise per Week: Patient declined    Minutes of Exercise per Session: Not on file  Stress: No Stress Concern Present (07/15/2023)   [employer] of Occupational Health - Occupational Stress Questionnaire    Feeling of Stress : Only a little  Social Connections: Moderately Isolated (07/15/2023)   Social Connection and Isolation Panel [NHANES]    Frequency of Communication with Friends and Family: Once a week    Frequency of Social Gatherings with Friends and Family: Once a week    Attends Religious Services: 1 to 4 times per year    Active Member of [employer] or Organizations: No    Attends Banker Meetings: Not on file    Marital Status: Married  Catering manager Violence: Not At Risk (12/24/2022)   Humiliation, Afraid, Rape, and Kick questionnaire    Fear of Current or Ex-Partner: No    Emotionally Abused: No    Physically Abused: No    Sexually Abused: No    Outpatient Medications Prior to Visit  Medication Sig Dispense Refill   Albuterol-Budesonide  (AIRSUPRA) 90-80 MCG/ACT AERO TAKE 2 PUFFS BY MOUTH EVERY 6 HOURS AS NEEDED 10.7 g 5   ibuprofen (ADVIL) 200 MG tablet Take 800 mg by mouth every 6 (six) hours as needed for moderate pain.     levonorgestrel (MIRENA) 20 MCG/24HR IUD 1 Intra Uterine Device (1 each total) by Intrauterine route once. 1 each 0   tiZANidine (ZANAFLEX) 4 MG tablet Take 1 tablet (4 mg total) by mouth every 8 (eight) hours as needed for muscle spasms. 40 tablet 1   traMADol (ULTRAM) 50 MG tablet Take 1-2 tablets (50-100 mg total) by mouth every 6 (six) hours as needed. 30 tablet  0   gabapentin (NEURONTIN) 300 MG capsule Take 2 capsules (600 mg total) by mouth 3 (three) times daily. 540 capsule 0   hydrOXYzine (VISTARIL) 25 MG capsule TAKE 1 CAPSULE (25 MG TOTAL) BY MOUTH EVERY 8 (EIGHT) HOURS AS NEEDED. 270 capsule 1   levETIRAcetam (KEPPRA) 500 MG tablet Take 1 tablet (500 mg total) by mouth 2 (two) times daily. 60 tablet 11   methylPREDNISolone (MEDROL) 4 MG tablet Medrol dose pack. Take as instructed 21 tablet 0   albuterol (PROVENTIL) (2.5 MG/3ML) 0.083% nebulizer solution Take 3 mLs (2.5 mg total) by nebulization every 4 (four) hours as needed for wheezing or shortness of breath. 75 mL 2   No facility-administered medications prior to visit.    Allergies  Allergen Reactions   Penicillins Rash    Has patient had a PCN reaction causing immediate rash, facial/tongue/throat swelling, SOB or lightheadedness with hypotension: No Has patient had a PCN reaction causing severe rash involving mucus membranes or skin necrosis: No Has patient had a PCN reaction that required hospitalization No Has patient had a PCN reaction occurring within the last 10 years: No If all of the above answers are "NO", then may proceed with Cephalosporin use.    ROS Review of Systems  Constitutional:  Negative for chills and fever.  HENT:  Negative for congestion, sinus pressure, sinus pain and sore throat.   Eyes:  Negative for pain and  discharge.  Respiratory:  Negative for cough, shortness of breath and wheezing.   Cardiovascular:  Negative for chest pain and palpitations.  Gastrointestinal:  Negative for abdominal pain, diarrhea, nausea and vomiting.  Endocrine: Negative for polydipsia and polyuria.  Genitourinary:  Negative for dysuria and hematuria.  Musculoskeletal:  Negative for neck pain and neck stiffness.  Skin:  Negative for rash.  Neurological:  Negative for dizziness and weakness.  Psychiatric/Behavioral:  Negative for agitation and behavioral problems. The patient is nervous/anxious.       Objective:    Physical Exam Vitals reviewed.  Constitutional:      General: She is not in acute distress.    Appearance: She is not diaphoretic.  HENT:     Head: Normocephalic and atraumatic.     Nose: Nose normal.     Mouth/Throat:     Mouth: Mucous membranes are moist.  Eyes:     General: No scleral icterus.    Extraocular Movements: Extraocular movements intact.  Cardiovascular:     Rate and Rhythm: Normal rate and regular rhythm.     Pulses: Normal pulses.     Heart sounds: Normal heart sounds. No murmur heard. Pulmonary:     Breath sounds: Normal breath sounds. No wheezing or rales.  Musculoskeletal:     Cervical back: Neck supple. No tenderness.     Right lower leg: No edema.     Left lower leg: No edema.  Skin:    General: Skin is warm.     Findings: No rash.  Neurological:     General: No focal deficit present.     Mental Status: She is alert and oriented to person, place, and time.     Sensory: No sensory deficit.     Motor: Tremor (B/l hands) present. No weakness.  Psychiatric:        Mood and Affect: Mood normal.        Behavior: Behavior normal.     BP 133/84   Pulse 91   Ht 5\' 4"  (1.626 m)   Wt 143  lb 6.4 oz (65 kg)   SpO2 98%   BMI 24.61 kg/m  Wt Readings from Last 3 Encounters:  12/25/23 143 lb 6.4 oz (65 kg)  07/16/23 148 lb (67.1 kg)  04/11/23 144 lb 6.4 oz (65.5 kg)     Lab Results  Component Value Date   TSH 2.070 07/16/2023   Lab Results  Component Value Date   WBC 7.3 04/11/2023   HGB 14.2 04/11/2023   HCT 43.1 04/11/2023   MCV 94 04/11/2023   PLT 359 04/11/2023   Lab Results  Component Value Date   NA 139 04/11/2023   K 4.4 04/11/2023   CO2 21 04/11/2023   GLUCOSE 76 04/11/2023   BUN 11 04/11/2023   CREATININE 0.55 (L) 04/11/2023   BILITOT <0.2 04/11/2023   ALKPHOS 85 04/11/2023   AST 14 04/11/2023   ALT 8 04/11/2023   PROT 6.9 04/11/2023   ALBUMIN 4.2 04/11/2023   CALCIUM 9.0 04/11/2023   ANIONGAP 7 12/25/2022   EGFR 117 04/11/2023   GFR 97.90 01/30/2020   Lab Results  Component Value Date   CHOL 163 04/11/2023   Lab Results  Component Value Date   HDL 49 04/11/2023   Lab Results  Component Value Date   LDLCALC 88 04/11/2023   Lab Results  Component Value Date   TRIG 150 (H) 04/11/2023   Lab Results  Component Value Date   CHOLHDL 3.3 04/11/2023   Lab Results  Component Value Date   HGBA1C 5.3 04/11/2023      Assessment & Plan:   Problem List Items Addressed This Visit       Cardiovascular and Mediastinum   Essential hypertension - Primary   BP: 133/84  Now well controlled without any antihypertensive Used to be uncontrolled even with multiple antihypertensives Was likely elevated in the past due to chronic hip pain      Relevant Orders   CMP14+EGFR   CBC with Differential/Platelet     Endocrine   Subclinical hypothyroidism   Lab Results  Component Value Date   TSH 2.070 07/16/2023   Repeat TSH and free T4 wnl Asymptomatic currently      Relevant Orders   TSH + free T4     Other   GAD (generalized anxiety disorder) (Chronic)   Well controlled with gabapentin 600 mg 3 times daily for now Was on Xanax in the past, but had BZD dependence Hydroxyzine 25 mg TID PRN for anxiety Used to see Psychiatry - Kelsey Evelene Croon      Relevant Medications   hydrOXYzine (VISTARIL) 25 MG capsule    Depression, major, recurrent, moderate (HCC) (Chronic)   Well-controlled, with gabapentin 600 mg 3 times daily Had anxiety, which was worsening her MDD DCed Prozac to 80 mg daily as she is doing better with gabapentin alone      Relevant Medications   gabapentin (NEURONTIN) 300 MG capsule   hydrOXYzine (VISTARIL) 25 MG capsule   Mixed hyperlipidemia   Lipid profile reviewed Had started Crestor, but did not continue due to myalgias      Relevant Orders   Lipid panel   Alcohol dependence in remission (HCC)   Was in rehab facility for alcohol dependence Has had delirium tremens Had Vivitrol injection Now in remission      Other Visit Diagnoses       Vitamin D deficiency       Relevant Orders   VITAMIN D 25 Hydroxy (Vit-D Deficiency, Fractures)     Hyperglycemia  Relevant Orders   Hemoglobin A1c         Meds ordered this encounter  Medications   gabapentin (NEURONTIN) 300 MG capsule    Sig: Take 2 capsules (600 mg total) by mouth 3 (three) times daily.    Dispense:  540 capsule    Refill:  1   hydrOXYzine (VISTARIL) 25 MG capsule    Sig: Take 1 capsule (25 mg total) by mouth every 8 (eight) hours as needed.    Dispense:  270 capsule    Refill:  1    Follow-up: Return in about 6 months (around 06/23/2024).    Anabel Halon, [name]

## 2023-12-25 NOTE — Assessment & Plan Note (Signed)
Well-controlled, with gabapentin 600 mg 3 times daily Had anxiety, which was worsening her MDD DCed Prozac to 80 mg daily as she is doing better with gabapentin alone

## 2023-12-25 NOTE — Assessment & Plan Note (Signed)
BP: 133/84  Now well controlled without any antihypertensive Used to be uncontrolled even with multiple antihypertensives Was likely elevated in the past due to chronic hip pain

## 2023-12-25 NOTE — Assessment & Plan Note (Signed)
Well controlled with gabapentin 600 mg 3 times daily for now Was on Xanax in the past, but had BZD dependence Hydroxyzine 25 mg TID PRN for anxiety Used to see Psychiatry - Dr Evelene Croon

## 2023-12-25 NOTE — Assessment & Plan Note (Signed)
Lipid profile reviewed Had started Crestor, but did not continue due to myalgias

## 2023-12-25 NOTE — Patient Instructions (Signed)
Please continue to take medications as prescribed.  Please continue to follow DASH diet and perform moderate exercise/walking at least 150 mins/week.  Please get fasting blood tests done before the next visit.

## 2023-12-25 NOTE — Assessment & Plan Note (Signed)
Was in rehab facility for alcohol dependence Has had delirium tremens Had Vivitrol injection Now in remission

## 2023-12-25 NOTE — Assessment & Plan Note (Signed)
Lab Results  Component Value Date   TSH 2.070 07/16/2023   Repeat TSH and free T4 wnl Asymptomatic currently

## 2023-12-31 ENCOUNTER — Encounter: Payer: Self-pay | Admitting: Internal Medicine

## 2024-05-08 ENCOUNTER — Ambulatory Visit: Payer: Self-pay | Admitting: Internal Medicine

## 2024-05-08 LAB — CMP14+EGFR
ALT: 9 IU/L (ref 0–32)
AST: 10 IU/L (ref 0–40)
Albumin: 4.1 g/dL (ref 3.9–4.9)
Alkaline Phosphatase: 69 IU/L (ref 44–121)
BUN/Creatinine Ratio: 17 (ref 9–23)
BUN: 10 mg/dL (ref 6–24)
Bilirubin Total: 0.3 mg/dL (ref 0.0–1.2)
CO2: 18 mmol/L — ABNORMAL LOW (ref 20–29)
Calcium: 9 mg/dL (ref 8.7–10.2)
Chloride: 106 mmol/L (ref 96–106)
Creatinine, Ser: 0.6 mg/dL (ref 0.57–1.00)
Globulin, Total: 2.5 g/dL (ref 1.5–4.5)
Glucose: 105 mg/dL — ABNORMAL HIGH (ref 70–99)
Potassium: 4.5 mmol/L (ref 3.5–5.2)
Sodium: 139 mmol/L (ref 134–144)
Total Protein: 6.6 g/dL (ref 6.0–8.5)
eGFR: 114 mL/min/{1.73_m2} (ref >59)

## 2024-05-08 LAB — LIPID PANEL
Chol/HDL Ratio: 4 ratio (ref 0.0–4.4)
Cholesterol, Total: 181 mg/dL (ref 100–199)
HDL: 45 mg/dL (ref >39)
LDL Chol Calc (NIH): 123 mg/dL — ABNORMAL HIGH (ref 0–99)
Triglycerides: 67 mg/dL (ref 0–149)
VLDL Cholesterol Cal: 13 mg/dL (ref 5–40)

## 2024-05-08 LAB — CBC WITH DIFFERENTIAL/PLATELET
Basophils Absolute: 0 10*3/uL (ref 0.0–0.2)
Basos: 1 %
EOS (ABSOLUTE): 0.3 10*3/uL (ref 0.0–0.4)
Eos: 5 %
Hematocrit: 39.3 % (ref 34.0–46.6)
Hemoglobin: 12.8 g/dL (ref 11.1–15.9)
Immature Grans (Abs): 0 10*3/uL (ref 0.0–0.1)
Immature Granulocytes: 0 %
Lymphocytes Absolute: 1.8 10*3/uL (ref 0.7–3.1)
Lymphs: 30 %
MCH: 30.4 pg (ref 26.6–33.0)
MCHC: 32.6 g/dL (ref 31.5–35.7)
MCV: 93 fL (ref 79–97)
Monocytes Absolute: 0.5 10*3/uL (ref 0.1–0.9)
Monocytes: 9 %
Neutrophils Absolute: 3.2 10*3/uL (ref 1.4–7.0)
Neutrophils: 55 %
Platelets: 355 10*3/uL (ref 150–450)
RBC: 4.21 x10E6/uL (ref 3.77–5.28)
RDW: 12.2 % (ref 11.7–15.4)
WBC: 5.8 10*3/uL (ref 3.4–10.8)

## 2024-05-08 LAB — TSH+FREE T4
Free T4: 1.05 ng/dL (ref 0.82–1.77)
TSH: 4.54 u[IU]/mL — ABNORMAL HIGH (ref 0.450–4.500)

## 2024-05-08 LAB — HEMOGLOBIN A1C
Est. average glucose Bld gHb Est-mCnc: 103 mg/dL
Hgb A1c MFr Bld: 5.2 % (ref 4.8–5.6)

## 2024-05-08 LAB — VITAMIN D 25 HYDROXY (VIT D DEFICIENCY, FRACTURES): Vit D, 25-Hydroxy: 24.8 ng/mL — ABNORMAL LOW (ref 30.0–100.0)

## 2024-06-06 ENCOUNTER — Other Ambulatory Visit: Payer: Self-pay | Admitting: Internal Medicine

## 2024-06-06 DIAGNOSIS — J4520 Mild intermittent asthma, uncomplicated: Secondary | ICD-10-CM

## 2024-07-18 ENCOUNTER — Encounter: Payer: Self-pay | Admitting: Radiology

## 2024-07-21 ENCOUNTER — Encounter: Payer: Self-pay | Admitting: Internal Medicine

## 2024-07-21 ENCOUNTER — Ambulatory Visit (INDEPENDENT_AMBULATORY_CARE_PROVIDER_SITE_OTHER): Payer: Commercial Managed Care - PPO | Admitting: Internal Medicine

## 2024-07-21 VITALS — BP 118/79 | HR 66 | Ht 64.0 in | Wt 134.0 lb

## 2024-07-21 DIAGNOSIS — J4520 Mild intermittent asthma, uncomplicated: Secondary | ICD-10-CM

## 2024-07-21 DIAGNOSIS — E038 Other specified hypothyroidism: Secondary | ICD-10-CM | POA: Diagnosis not present

## 2024-07-21 DIAGNOSIS — F411 Generalized anxiety disorder: Secondary | ICD-10-CM | POA: Diagnosis not present

## 2024-07-21 NOTE — Assessment & Plan Note (Addendum)
 Well controlled with gabapentin  600 mg 3 times daily for now Was on Xanax  in the past, but had BZD dependence Hydroxyzine  25 mg TID PRN for anxiety, but mostly takes it at bedtime only for insomnia Used to see Psychiatry - Dr Vincente

## 2024-07-21 NOTE — Patient Instructions (Signed)
 Please continue to take medications as prescribed.  Please continue to follow heart healthy diet and perform moderate exercise/walking at least 150 mins/week.

## 2024-07-21 NOTE — Progress Notes (Signed)
 Established Patient Office Visit  Subjective:  Patient ID: Kelsey [name], female    DOB: 1981-08-20  Age: 43 y.o. MRN: [id number]  CC:  Chief Complaint  Patient presents with   Hypertension    Six month follow up    HPI Kelsey [name] is a 43 y.o. female with past medical history of HTN, asthma, GERD, seizure disorder, depression with anxiety, chronic back and hip pain and tobacco abuse who presents for f/u of her chronic medical conditions.  Asthma: She has better response with Airsupra . She denies any dyspnea or wheezing currently.  Denies any recent fever or chills. She has quit smoking now, but has started vaping.  Depression with anxiety: She currently takes gabapentin  600 mg 3 times daily.  She has been feeling better now. She has episodes of anxiety, which are manageable with Vistaril . She used to take Xanax  in the past, but had withdrawal symptoms from it. She has tried multiple SSRIs and SNRIs in the past.  She denies SI or HI currently.  Seizure disorder: She has stopped taking Keppra  as she was feeling drowsy with it. She has seen neurology for it and was advised to continue Keppra . She had normal EEG.  Seizure episodes were likely due to alcohol and BZD use.  She has lost about 9 lbs since the last visit. She has been busy with her new home, but has been going for walks daily as well.  Past Medical History:  Diagnosis Date   Anxiety    Asthma    hosp. 2018 and 2019 no h/o intubation    Depression    GERD (gastroesophageal reflux disease)    Hypertension    Palpitations    Perianal abscess 11/22/2016   12/07/2016.  [facility], [place].  Alm Angle, MD.  Perianal abscess is much better but still needs local wound care.  Continue sitz baths 2-3 times perday until all drainage and pain are gone.  Follow up as needed.   Preop general physical exam 01/26/2022   PVC (premature ventricular contraction)    Seasonal allergies    Seizures (HCC)    01/15/22 was  last seizure   Tachycardia     Past Surgical History:  Procedure Laterality Date   CESAREAN SECTION  10/15/2009, 02/08/2006   INCISION AND DRAINAGE PERIRECTAL ABSCESS Bilateral 11/22/2016   Procedure: IRRIGATION AND DEBRIDEMENT PERIRECTAL ABSCESS;  Surgeon: Alm Angle, MD;  Location: [facility];  Service: General;  Laterality: Bilateral;   INTRAUTERINE DEVICE INSERTION     Mirena  12/24/19 exp 12/28/21 Dr Donna Just [facility]   TOTAL HIP ARTHROPLASTY Left 02/17/2022   Procedure: Left TOTAL HIP ARTHROPLASTY ANTERIOR APPROACH;  Surgeon: Vernetta Lonni GRADE, MD;  Location: [facility];  Service: Orthopedics;  Laterality: Left;   TOTAL HIP ARTHROPLASTY Right 05/05/2022   Procedure: RIGHT TOTAL HIP ARTHROPLASTY ANTERIOR APPROACH;  Surgeon: Vernetta Lonni GRADE, MD;  Location: [facility];  Service: Orthopedics;  Laterality: Right;    Family History  Problem Relation Age of Onset   Rheumatologic disease Mother    Arthritis Mother    Asthma Mother    Depression Mother    Heart disease Father    Asthma Maternal Grandmother    Hypertension Maternal Grandmother    Heart disease Maternal Grandfather    Lung cancer Paternal Grandmother    Healthy Daughter    Healthy Son     Social History   Socioeconomic History   Marital status: Married    Spouse name:  Not on file   Number of children: 2   Years of education: Not on file   Highest education level: Associate degree: occupational, Scientist, product/process development, or vocational program  Occupational History   Occupation: respiratory therapists    Employer: [employer]  Tobacco Use   Smoking status: Former    Current packs/day: 0.00    Average packs/day: 0.3 packs/day for 10.0 years (2.5 ttl pk-yrs)    Types: Cigarettes    Quit date: 02/26/2024    Years since quitting: 0.4   Smokeless tobacco: Never  Vaping Use   Vaping status: Never Used  Substance and Sexual Activity   Alcohol use: Yes    Alcohol/week: 4.0 standard drinks of alcohol    Types: 4  Shots of liquor per week   Drug use: Not Currently    Types: Marijuana   Sexual activity: Not on file  Other Topics Concern   Not on file  Social History Narrative   Respiratory Therapist, 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    College ed    2 kids    Divorced but remarried since 05/2019 new last name will be Gretta   Wears seat belt, feels safe in relationship    Right handed   Social Drivers of Health   Financial Resource Strain: Medium Risk (07/17/2024)   Overall Financial Resource Strain (CARDIA)    Difficulty of Paying Living Expenses: Somewhat hard  Food Insecurity: No Food Insecurity (07/17/2024)   Hunger Vital Sign    Worried About Running Out of Food in the Last Year: Never true    Ran Out of Food in the Last Year: Never true  Transportation Needs: No Transportation Needs (07/17/2024)   PRAPARE - Administrator, Civil Service (Medical): No    Lack of Transportation (Non-Medical): No  Physical Activity: Insufficiently Active (07/17/2024)   Exercise Vital Sign    Days of Exercise per Week: 3 days    Minutes of Exercise per Session: 20 min  Stress: No Stress Concern Present (07/17/2024)   [employer] of Occupational Health - Occupational Stress Questionnaire    Feeling of Stress: Only a little  Social Connections: Moderately Integrated (07/17/2024)   Social Connection and Isolation Panel    Frequency of Communication with Friends and Family: Twice a week    Frequency of Social Gatherings with Friends and Family: Once a week    Attends Religious Services: More than 4 times per year    Active Member of [employer] or Organizations: No    Attends Engineer, structural: Not on file    Marital Status: Married  Catering manager Violence: Not At Risk (12/24/2022)   Humiliation, Afraid, Rape, and Kick questionnaire    Fear of Current or Ex-Partner: No    Emotionally Abused: No    Physically Abused: No    Sexually Abused: No    Outpatient Medications Prior to Visit   Medication Sig Dispense Refill   albuterol  (PROVENTIL ) (2.5 MG/3ML) 0.083% nebulizer solution Take 3 mLs (2.5 mg total) by nebulization every 4 (four) hours as needed for wheezing or shortness of breath. 75 mL 2   Albuterol -Budesonide  (AIRSUPRA ) 90-80 MCG/ACT AERO INHALE 2 PUFFS BY MOUTH EVERY 6 HOURS AS NEEDED 10.7 g 5   gabapentin  (NEURONTIN ) 300 MG capsule Take 2 capsules (600 mg total) by mouth 3 (three) times daily. 540 capsule 1   hydrOXYzine  (VISTARIL ) 25 MG capsule Take 1 capsule (25 mg total) by mouth every 8 (eight) hours as needed. 270 capsule 1  levonorgestrel  (MIRENA ) 20 MCG/24HR IUD 1 Intra Uterine Device (1 each total) by Intrauterine route once. 1 each 0   tiZANidine  (ZANAFLEX ) 4 MG tablet Take 1 tablet (4 mg total) by mouth every 8 (eight) hours as needed for muscle spasms. 40 tablet 1   traMADol  (ULTRAM ) 50 MG tablet Take 1-2 tablets (50-100 mg total) by mouth every 6 (six) hours as needed. 30 tablet 0   ibuprofen  (ADVIL ) 200 MG tablet Take 800 mg by mouth every 6 (six) hours as needed for moderate pain.     No facility-administered medications prior to visit.    Allergies  Allergen Reactions   Penicillins Rash    Has patient had a PCN reaction causing immediate rash, facial/tongue/throat swelling, SOB or lightheadedness with hypotension: No Has patient had a PCN reaction causing severe rash involving mucus membranes or skin necrosis: No Has patient had a PCN reaction that required hospitalization No Has patient had a PCN reaction occurring within the last 10 years: No If all of the above answers are NO, then may proceed with Cephalosporin use.    ROS Review of Systems  Constitutional:  Negative for chills and fever.  HENT:  Negative for congestion, sinus pressure, sinus pain and sore throat.   Eyes:  Negative for pain and discharge.  Respiratory:  Negative for cough, shortness of breath and wheezing.   Cardiovascular:  Negative for chest pain and palpitations.   Gastrointestinal:  Negative for abdominal pain, diarrhea, nausea and vomiting.  Endocrine: Negative for polydipsia and polyuria.  Genitourinary:  Negative for dysuria and hematuria.  Musculoskeletal:  Negative for neck pain and neck stiffness.  Skin:  Negative for rash.  Neurological:  Negative for dizziness and weakness.  Psychiatric/Behavioral:  Negative for agitation and behavioral problems. The patient is nervous/anxious.       Objective:    Physical Exam Vitals reviewed.  Constitutional:      General: She is not in acute distress.    Appearance: She is not diaphoretic.  HENT:     Head: Normocephalic and atraumatic.     Nose: Nose normal.     Mouth/Throat:     Mouth: Mucous membranes are moist.  Eyes:     General: No scleral icterus.    Extraocular Movements: Extraocular movements intact.  Cardiovascular:     Rate and Rhythm: Normal rate and regular rhythm.     Heart sounds: Normal heart sounds. No murmur heard. Pulmonary:     Breath sounds: Normal breath sounds. No wheezing or rales.  Musculoskeletal:     Cervical back: Neck supple. No tenderness.     Right lower leg: No edema.     Left lower leg: No edema.  Skin:    General: Skin is warm.     Findings: No rash.  Neurological:     General: No focal deficit present.     Mental Status: She is alert and oriented to person, place, and time.     Sensory: No sensory deficit.     Motor: Tremor (B/l hands) present. No weakness.  Psychiatric:        Mood and Affect: Mood normal.        Behavior: Behavior normal.     BP 118/79   Pulse 66   Ht 5' 4 (1.626 m)   Wt 134 lb (60.8 kg)   SpO2 98%   BMI 23.00 kg/m  Wt Readings from Last 3 Encounters:  07/21/24 134 lb (60.8 kg)  12/25/23 143 lb 6.4 oz (  65 kg)  07/16/23 148 lb (67.1 kg)    Lab Results  Component Value Date   TSH 4.540 (H) 05/07/2024   Lab Results  Component Value Date   WBC 5.8 05/07/2024   HGB 12.8 05/07/2024   HCT 39.3 05/07/2024   MCV 93  05/07/2024   PLT 355 05/07/2024   Lab Results  Component Value Date   NA 139 05/07/2024   K 4.5 05/07/2024   CO2 18 (L) 05/07/2024   GLUCOSE 105 (H) 05/07/2024   BUN 10 05/07/2024   CREATININE 0.60 05/07/2024   BILITOT 0.3 05/07/2024   ALKPHOS 69 05/07/2024   AST 10 05/07/2024   ALT 9 05/07/2024   PROT 6.6 05/07/2024   ALBUMIN 4.1 05/07/2024   CALCIUM  9.0 05/07/2024   ANIONGAP 7 12/25/2022   EGFR 114 05/07/2024   GFR 97.90 01/30/2020   Lab Results  Component Value Date   CHOL 181 05/07/2024   Lab Results  Component Value Date   HDL 45 05/07/2024   Lab Results  Component Value Date   LDLCALC 123 (H) 05/07/2024   Lab Results  Component Value Date   TRIG 67 05/07/2024   Lab Results  Component Value Date   CHOLHDL 4.0 05/07/2024   Lab Results  Component Value Date   HGBA1C 5.2 05/07/2024      Assessment & Plan:   Problem List Items Addressed This Visit       Respiratory   Asthma   Usually well controlled with AirSupra  inhaler, but had 2 episodes of asthma exacerbations in last 1 year before starting Airsupra  Uses AirSupra  rarely        Endocrine   Subclinical hypothyroidism   Lab Results  Component Value Date   TSH 4.540 (H) 05/07/2024   Has had borderline elevated TSH in the past as well - will repeat TSH and free T4 later Asymptomatic currently        Other   GAD (generalized anxiety disorder) - Primary (Chronic)   Well controlled with gabapentin  600 mg 3 times daily for now Was on Xanax  in the past, but had BZD dependence Hydroxyzine  25 mg TID PRN for anxiety, but mostly takes it at bedtime only for insomnia Used to see Psychiatry - Dr Vincente          No orders of the defined types were placed in this encounter.   Follow-up: No follow-ups on file.    Suzzane MARLA Blanch, MD

## 2024-07-21 NOTE — Assessment & Plan Note (Signed)
 Usually well controlled with AirSupra  inhaler, but had 2 episodes of asthma exacerbations in last 1 year before starting Airsupra  Uses AirSupra  rarely

## 2024-07-21 NOTE — Assessment & Plan Note (Addendum)
 Lab Results  Component Value Date   TSH 4.540 (H) 05/07/2024   Has had borderline elevated TSH in the past as well - will repeat TSH and free T4 later Asymptomatic currently

## 2024-08-05 ENCOUNTER — Ambulatory Visit: Payer: Self-pay

## 2024-08-05 NOTE — Telephone Encounter (Signed)
 Scheduled with Chelsa 9/22

## 2024-08-05 NOTE — Telephone Encounter (Signed)
 FYI Only or Action Required?: Action required by provider: request for appointment.  Pt was offered tomorrow and 9/11 appts, pt declined states that she has to work 7-7. Pt states that she has off Mondays.   Patient was last seen in primary care on 07/21/2024 by Tobie Suzzane POUR, MD.  Called Nurse Triage reporting Dizziness.  Symptoms began several weeks ago.  Interventions attempted: Rest, hydration, or home remedies.  Symptoms are: unchanged.  Triage Disposition: See PCP When Office is Open (Within 3 Days)  Patient/caregiver understands and will follow disposition?: No  Copied from CRM #[id number]. Topic: Clinical - Red Word Triage >> Aug 05, 2024  8:26 AM Cleave MATSU wrote: Red Word that prompted transfer to Nurse Triage: pt has been feeling sick and dizzy like she's going to pass out also arms been going numb at night and a little chest pain Reason for Disposition  [1] MODERATE dizziness (e.g., interferes with normal activities) AND [2] has been evaluated by doctor (or NP/PA) for this  Answer Assessment - Initial Assessment Questions 1. DESCRIPTION: Describe your dizziness.     Feels like I could pass out 2. LIGHTHEADED: Do you feel lightheaded? (e.g., somewhat faint, woozy, weak upon standing)     Somewhat faint 3. VERTIGO: Do you feel like either you or the room is spinning or tilting? (i.e., vertigo)     Feels like her and room is spinning, feels like blood is draining from me, then I vomit 4. SEVERITY: How bad is it?  Do you feel like you are going to faint? Can you stand and walk?     Can stand and walk at this time, dizziness occurred 3-4x over the last couple months 5. ONSET:  When did the dizziness begin?     Months ago 6. AGGRAVATING FACTORS: Does anything make it worse? (e.g., standing, change in head position)     Lay down-feels better 7. HEART RATE: Can you tell me your heart rate? How many beats in 15 seconds?  (Note: Not all patients can do this.)        Feels like it is racing at the time, but unsure of the rate 8. CAUSE: What do you think is causing the dizziness? (e.g., decreased fluids or food, diarrhea, emotional distress, heat exposure, new medicine, sudden standing, vomiting; unknown)     Thought was hormones, did see GYN, they cleared her 9. RECURRENT SYMPTOM: Have you had dizziness before? If Yes, ask: When was the last time? What happened that time?     denies 10. OTHER SYMPTOMS: Do you have any other symptoms? (e.g., fever, chest pain, vomiting, diarrhea, bleeding)       Vomiting when I do get dizziness  Pt has been seen/evaluated by her OB/GYN for this. Pt states that they recommend she follow up with PCP. Pt states that the dizziness occurs occasionally, is not experiencing the dizziness at time of call.  Protocols used: Dizziness - Lightheadedness-A-AH

## 2024-08-12 ENCOUNTER — Other Ambulatory Visit: Payer: Self-pay | Admitting: Internal Medicine

## 2024-08-12 DIAGNOSIS — F331 Major depressive disorder, recurrent, moderate: Secondary | ICD-10-CM

## 2024-08-18 ENCOUNTER — Ambulatory Visit: Admitting: Nurse Practitioner

## 2024-08-19 ENCOUNTER — Ambulatory Visit (INDEPENDENT_AMBULATORY_CARE_PROVIDER_SITE_OTHER)

## 2024-08-19 VITALS — BP 110/72 | HR 82 | Resp 18 | Ht 64.0 in | Wt 136.0 lb

## 2024-08-19 DIAGNOSIS — R42 Dizziness and giddiness: Secondary | ICD-10-CM

## 2024-08-19 NOTE — Progress Notes (Signed)
 Established Patient Office Visit  Subjective   Patient ID: Kelsey [name], female    DOB: [birth year]  Age: 43 y.o. MRN: [id number]  Chief Complaint  Patient presents with   Dizziness    Pt complains on ongoing dizzy episode since February, has happened multiple times since then. Pt states she will get very dizzy, fatigued, and vomiting during episode. Was advised by gyne to see pcp for possible heart issue since hx of heart palpitations. Pt states she does not have chest pain or SOB     HPI Discussed the use of AI scribe software for clinical note transcription with the patient, who gave verbal consent to proceed.  History of Present Illness    Kelsey [name] is a 43 year old female with mitral valve regurgitation who presents with dizziness.  Dizziness and presyncope - Dizziness present since February, occurring at random times without specific triggers - Sensation described as feeling like she is going to pass out and get sick - Associated with weakness and a sensation of blood draining from her head - Episodes occur in various settings, including church and parties - No consistent relationship to eating or fasting  Lifestyle modifications - History of alcoholism, now resolved - Status post double hip surgery - Weight loss achieved - Smoking cessation - Dietary changes implemented, but does not consistently eat breakfast early in the morning  Current medications - Gabapentin  2-3 times daily - Hydroxyzine  at night as needed - Mirena  IUD for contraception - No current use of tizanidine  or tramadol      Patient Active Problem List   Diagnosis Date Noted   Dizziness 08/24/2024   Subclinical hypothyroidism 07/16/2023   Encounter for general adult medical examination with abnormal findings 04/13/2023   Asthma exacerbation 12/24/2022   Alcohol dependence in remission (HCC) 09/02/2022   Opioid dependence in remission (HCC) 09/02/2022   Protein-calorie malnutrition,  moderate 06/24/2022   Alcohol withdrawal seizure (HCC) 06/23/2022   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   Hospital discharge follow-up 02/23/2022   Status post left hip replacement 02/17/2022   Transaminitis 01/26/2022   Macrocytic anemia 01/26/2022   Thrombocytopenia 01/18/2022   Avascular necrosis of bone of left hip (HCC) 12/20/2021   Avascular necrosis of bone of right hip (HCC) 12/20/2021   Lumbar spondylosis 10/05/2021   Positive double stranded DNA antibody test 09/07/2021   Mixed hyperlipidemia 08/16/2021   Essential hypertension 07/28/2021   Tobacco abuse 07/28/2021   Polyarthritis 07/28/2021   Anal skin tag 10/21/2019   Internal hemorrhoids 09/26/2017   Asthma 10/15/2015   Allergic rhinitis 10/15/2015   GAD (generalized anxiety disorder) 07/14/2015   Depression, major, recurrent, moderate (HCC) 07/14/2015   GERD (gastroesophageal reflux disease) 08/22/2013    ROS    Objective:     BP 110/72 (BP Location: Left Arm, Patient Position: Sitting, Cuff Size: Normal)   Pulse 82   Resp 18   Ht 5' 4 (1.626 m)   Wt 136 lb 0.6 oz (61.7 kg)   SpO2 96%   BMI 23.35 kg/m  BP Readings from Last 3 Encounters:  08/19/24 110/72  07/21/24 118/79  12/25/23 133/84   Wt Readings from Last 3 Encounters:  08/19/24 136 lb 0.6 oz (61.7 kg)  07/21/24 134 lb (60.8 kg)  12/25/23 143 lb 6.4 oz (65 kg)     Physical Exam Vitals and nursing note reviewed.  Constitutional:      Appearance: Normal appearance.  HENT:     Head:  Normocephalic.     Right Ear: Tympanic membrane, ear canal and external ear normal.     Left Ear: Tympanic membrane, ear canal and external ear normal.     Nose: Nose normal.     Mouth/Throat:     Mouth: Mucous membranes are moist.     Pharynx: Oropharynx is clear.  Eyes:     Extraocular Movements: Extraocular movements intact.     Pupils: Pupils are equal, round, and reactive to light.  Cardiovascular:     Rate and Rhythm: Normal rate  and regular rhythm.  Pulmonary:     Effort: Pulmonary effort is normal.     Breath sounds: Normal breath sounds.  Abdominal:     General: Bowel sounds are normal.     Palpations: Abdomen is soft.  Musculoskeletal:        General: Normal range of motion.     Cervical back: Normal range of motion and neck supple.  Skin:    General: Skin is warm and dry.  Neurological:     General: No focal deficit present.     Mental Status: She is alert and oriented to person, place, and time.     Motor: No weakness.     Gait: Gait normal.  Psychiatric:        Mood and Affect: Mood normal.        Thought Content: Thought content normal.     No results found for any visits on 08/19/24.  Last CBC Lab Results  Component Value Date   WBC 5.8 05/07/2024   HGB 12.8 05/07/2024   HCT 39.3 05/07/2024   MCV 93 05/07/2024   MCH 30.4 05/07/2024   RDW 12.2 05/07/2024   PLT 355 05/07/2024   Last metabolic panel Lab Results  Component Value Date   GLUCOSE 105 (H) 05/07/2024   NA 139 05/07/2024   K 4.5 05/07/2024   CL 106 05/07/2024   CO2 18 (L) 05/07/2024   BUN 10 05/07/2024   CREATININE 0.60 05/07/2024   EGFR 114 05/07/2024   CALCIUM  9.0 05/07/2024   PHOS 3.9 11/15/2022   PROT 6.6 05/07/2024   ALBUMIN 4.1 05/07/2024   LABGLOB 2.5 05/07/2024   AGRATIO 1.6 04/11/2023   BILITOT 0.3 05/07/2024   ALKPHOS 69 05/07/2024   AST 10 05/07/2024   ALT 9 05/07/2024   ANIONGAP 7 12/25/2022   Last lipids Lab Results  Component Value Date   CHOL 181 05/07/2024   HDL 45 05/07/2024   LDLCALC 123 (H) 05/07/2024   TRIG 67 05/07/2024   CHOLHDL 4.0 05/07/2024   Last hemoglobin A1c Lab Results  Component Value Date   HGBA1C 5.2 05/07/2024   Last thyroid  functions Lab Results  Component Value Date   TSH 4.540 (H) 05/07/2024   Last vitamin D  Lab Results  Component Value Date   VD25OH 24.8 (L) 05/07/2024   Last vitamin B12 and Folate Lab Results  Component Value Date   VITAMINB12 1,063 (H)  01/19/2022   FOLATE 10.3 01/19/2022     The 10-year ASCVD risk score (Arnett DK, et al., 2019) is: 2.3%    Assessment & Plan:   Problem List Items Addressed This Visit       Other   Dizziness - Primary   Intermittent dizziness without clear triggers. Mitral valve regurgitation not contributing. Possible POTS or hypotension due to weight loss and lifestyle changes. Symptoms improve with rest. - Advise symptom diary to track triggers and patterns. - Encourage small breakfast to prevent  hypoglycemia. - Discuss adding salt to diet if POTS suspected. - Offer cardiology referral if symptoms persist. - Recommend eating 3 small meals daily and increasing water  intake.   - Recent labs from 3 months ago were unremarkable.       No follow-ups on file.    Leita Longs, FNP

## 2024-08-24 DIAGNOSIS — R42 Dizziness and giddiness: Secondary | ICD-10-CM | POA: Insufficient documentation

## 2024-08-24 NOTE — Assessment & Plan Note (Addendum)
 Intermittent dizziness without clear triggers. Mitral valve regurgitation not contributing. Possible POTS or hypotension due to weight loss and lifestyle changes. Symptoms improve with rest. - Advise symptom diary to track triggers and patterns. - Encourage small breakfast to prevent hypoglycemia. - Discuss adding salt to diet if POTS suspected. - Offer cardiology referral if symptoms persist. - Recommend eating 3 small meals daily and increasing water  intake.   - Recent labs from 3 months ago were unremarkable.

## 2024-09-29 ENCOUNTER — Encounter: Payer: Self-pay | Admitting: Radiology

## 2024-09-29 ENCOUNTER — Encounter: Payer: Self-pay | Admitting: Internal Medicine

## 2024-09-29 ENCOUNTER — Ambulatory Visit: Payer: Self-pay

## 2024-09-29 ENCOUNTER — Telehealth (INDEPENDENT_AMBULATORY_CARE_PROVIDER_SITE_OTHER): Admitting: Internal Medicine

## 2024-09-29 ENCOUNTER — Telehealth: Admitting: Physician Assistant

## 2024-09-29 DIAGNOSIS — J45901 Unspecified asthma with (acute) exacerbation: Secondary | ICD-10-CM

## 2024-09-29 MED ORDER — PROMETHAZINE-DM 6.25-15 MG/5ML PO SYRP: 5.0000 mL | ORAL_SOLUTION | Freq: Four times a day (QID) | ORAL | 0 refills | Status: AC | PRN

## 2024-09-29 MED ORDER — AZITHROMYCIN 250 MG PO TABS: ORAL_TABLET | ORAL | 0 refills | Status: AC

## 2024-09-29 MED ORDER — METHYLPREDNISOLONE 4 MG PO TBPK: ORAL_TABLET | ORAL | 0 refills | Status: AC

## 2024-09-29 NOTE — Assessment & Plan Note (Addendum)
 Advised to get home flu and COVID test, and contact if it is positive Started Medrol  Dosepak Started empiric azithromycin  considering her symptoms Promethazine  DM syrup as needed for cough Continue Airsupra  as needed for dyspnea or wheezing

## 2024-09-29 NOTE — Progress Notes (Signed)
 Virtual Visit via Video Note   Because of Kelsey [name]'s co-morbid illnesses, she is at least at moderate risk for complications without adequate follow up.  This format is felt to be most appropriate for this patient at this time.  All issues noted in this document were discussed and addressed.  A limited physical exam was performed with this format.      Evaluation Performed:  Follow-up visit  Date:  09/29/2024   ID:  Kelsey [name], DOB 02-18-1981, MRN [id number]  Patient Location: Home Provider Location: Office/Clinic  Participants: Patient Location of Patient: Home Location of Provider: Telehealth Consent was obtain for visit to be over via telehealth. I verified that I am speaking with the correct person using two identifiers.  PCP:  Tobie Suzzane POUR, MD   Chief Complaint: Cough and dyspnea  History of Present Illness:    Kelsey [name] is a 43 y.o. female who has a video visit for complaint of cough, chest congestion and mild dyspnea for the last 2 days.  She started having wheezing yesterday as well.  She has been using Airsupra  every 4 hours for dyspnea/wheezing.  Denies any fever, but has had chills. She works as a buyer, retail.   The patient does have symptoms concerning for COVID-19 infection (fever, chills, cough, or new shortness of breath).   Past Medical, Surgical, Social History, Allergies, and Medications have been Reviewed.  Past Medical History:  Diagnosis Date   Anxiety    Asthma    hosp. 2018 and 2019 no h/o intubation    Depression    GERD (gastroesophageal reflux disease)    Hypertension    Palpitations    Perianal abscess 11/22/2016   12/07/2016.  [facility], [place].  Alm Angle, MD.  Perianal abscess is much better but still needs local wound care.  Continue sitz baths 2-3 times perday until all drainage and pain are gone.  Follow up as needed.   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     Allergies:   Penicillins   ROS:   Please see the history of present illness. All other systems reviewed and are negative.   Labs/Other Tests and Data Reviewed:    Recent Labs: 05/07/2024: ALT 9; BUN 10; Creatinine, Ser 0.60; Hemoglobin 12.8; Platelets 355; Potassium 4.5; Sodium 139; TSH 4.540   Recent Lipid Panel Lab Results  Component Value Date/Time   CHOL 181 05/07/2024 09:16  AM   TRIG 67 05/07/2024 09:16 AM   HDL 45 05/07/2024 09:16 AM   CHOLHDL 4.0 05/07/2024 09:16 AM   CHOLHDL 4.4 06/20/2022 06:42 AM   LDLCALC 123 (H) 05/07/2024 09:16 AM   LDLCALC 66 04/15/2018 01:00 PM    Wt Readings from Last 3 Encounters:  08/19/24 136 lb 0.6 oz (61.7 kg)  07/21/24 134 lb (60.8 kg)  12/25/23 143 lb 6.4 oz (65 kg)     Objective:    Vital Signs:  There were no vitals taken for this visit.   VITAL SIGNS:  reviewed GEN:  no acute distress EYES:  sclerae  anicteric, EOMI - Extraocular Movements Intact RESPIRATORY:  Patient reported dyspnea and wheezing, able to speak in full sentences. NEURO:  alert and oriented x 3, no obvious focal deficit PSYCH:  normal affect  ASSESSMENT & PLAN:    Assessment & Plan Exacerbation of asthma, unspecified asthma severity, unspecified whether persistent Advised to get home flu and COVID test, and contact if it is positive Started Medrol  Dosepak Started empiric azithromycin  considering her symptoms Promethazine  DM syrup as needed for cough Continue Airsupra  as needed for dyspnea or wheezing    I discussed the assessment and treatment plan with the patient. The patient was provided an opportunity to ask questions, and all were answered. The patient agreed with the plan and demonstrated an understanding of the instructions.   The patient was advised to call back or seek an in-person evaluation if the symptoms worsen or if the condition fails to improve as anticipated.  The above assessment and management plan was discussed with the patient. The patient verbalized understanding of and has agreed to the management plan.   Medication Adjustments/Labs and Tests Ordered: Current medicines are reviewed at length with the patient today.  Concerns regarding medicines are outlined above.   Tests Ordered: No orders of the defined types were placed in this encounter.   Medication Changes: No orders of the defined types were placed in this encounter.    Note: This dictation was prepared with Dragon dictation along with smaller phrase technology. Similar sounding words can be transcribed inadequately or may not be corrected upon review. Any transcriptional errors that result from this process are unintentional.      Disposition:  Follow up  Signed, Suzzane MARLA Blanch, MD  09/29/2024 3:04 PM     [facility]

## 2024-09-29 NOTE — Patient Instructions (Signed)
 Please start taking azithromycin  and prednisone  as prescribed.  Please use Airsupra  as needed for shortness of breath or wheezing.  Please take promethazine -DM syrup as needed for cough.  Please contact us  if your home COVID or flu test is positive.

## 2024-09-29 NOTE — Telephone Encounter (Signed)
 FYI Only or Action Required?: FYI only for provider: appointment scheduled on tomorrow  PT will have a vv this afternoon and if not needed she will cancel appt with Dr. Tobie for tomorrow.  Patient was last seen in primary care on 08/19/2024 by Bevely Doffing, FNP.  Called Nurse Triage reporting Wheezing - mild SOB //asthma + URI  Symptoms began several days ago.  Interventions attempted: Nothing.  Symptoms are: unchanged.  Triage Disposition: See HCP Within 4 Hours (Or PCP Triage)  Patient/caregiver understands and will follow disposition?: yes                                                    Reason for Triage: pt stated that she is sick and its affecting her asthma she wanted to see if she can get an appt for today.   Reason for Disposition  [1] MILD difficulty breathing (e.g., minimal/no SOB at rest, SOB with walking, pulse < 100) AND [2] NEW-onset or WORSE than normal  Answer Assessment - Initial Assessment Questions 1. RESPIRATORY STATUS: Describe your breathing? (e.g., wheezing, shortness of breath, unable to speak, severe coughing)      Wheezing this morning 2. ONSET: When did this breathing problem begin?      Saturday runny nose 3. PATTERN Does the difficult breathing come and go, or has it been constant since it started?      Comes and goes 4. SEVERITY: How bad is your breathing? (e.g., mild, moderate, severe)      mild 5. RECURRENT SYMPTOM: Have you had difficulty breathing before? If Yes, ask: When was the last time? and What happened that time?      yes 6. CARDIAC HISTORY: Do you have any history of heart disease? (e.g., heart attack, angina, bypass surgery, angioplasty)      palpitations 7. LUNG HISTORY: Do you have any history of lung disease?  (e.g., pulmonary embolus, asthma, emphysema)     Asthma 8. CAUSE: What do you think is causing the breathing problem?      Asthma + cold 9. OTHER  SYMPTOMS: Do you have any other symptoms? (e.g., chest pain, cough, dizziness, fever, runny nose)     Runny nose, Sore throat stiff neck , achy  Protocols used: Breathing Difficulty-A-AH

## 2024-09-29 NOTE — Progress Notes (Signed)
 Pt already completed visit with PCP, no need for this appointment.

## 2024-09-30 ENCOUNTER — Ambulatory Visit: Payer: Self-pay | Admitting: Internal Medicine

## 2024-10-30 ENCOUNTER — Other Ambulatory Visit: Payer: Self-pay | Admitting: Internal Medicine

## 2024-10-30 DIAGNOSIS — J4520 Mild intermittent asthma, uncomplicated: Secondary | ICD-10-CM

## 2024-10-31 ENCOUNTER — Other Ambulatory Visit: Payer: Self-pay | Admitting: Internal Medicine

## 2024-10-31 DIAGNOSIS — F411 Generalized anxiety disorder: Secondary | ICD-10-CM

## 2024-11-18 ENCOUNTER — Encounter: Payer: Self-pay | Admitting: Family Medicine

## 2024-11-18 ENCOUNTER — Ambulatory Visit: Payer: Self-pay

## 2024-11-18 ENCOUNTER — Ambulatory Visit: Admitting: Family Medicine

## 2024-11-18 VITALS — BP 112/80 | HR 102 | Temp 98.0°F | Ht 64.0 in | Wt 138.2 lb

## 2024-11-18 DIAGNOSIS — R52 Pain, unspecified: Secondary | ICD-10-CM | POA: Diagnosis not present

## 2024-11-18 LAB — INFLUENZA A AND B AG, IMMUNOASSAY
INFLUENZA A ANTIGEN: DETECTED — AB
INFLUENZA B ANTIGEN: NOT DETECTED

## 2024-11-18 MED ORDER — PREDNISONE 20 MG PO TABS: 60.0000 mg | ORAL_TABLET | Freq: Every day | ORAL | 0 refills | Status: AC

## 2024-11-18 MED ORDER — OSELTAMIVIR PHOSPHATE 75 MG PO CAPS: 75.0000 mg | ORAL_CAPSULE | Freq: Two times a day (BID) | ORAL | 0 refills | Status: AC

## 2024-11-18 NOTE — Telephone Encounter (Signed)
 Noted

## 2024-11-18 NOTE — Telephone Encounter (Signed)
" °  FYI Only or Action Required?: FYI only for provider: appointment scheduled on 11/18/2024 at [facility].  Patient was last seen in primary care on 09/29/2024 by Tobie Suzzane POUR, MD.  Called Nurse Triage reporting Nasal Congestion, Sore Throat, and Wheezing.  Symptoms began several days ago.  Interventions attempted: Nothing.  Symptoms are: gradually worsening.  Triage Disposition: See Physician Within 24 Hours  Patient/caregiver understands and will follow disposition?: Yes Copied from CRM #[id number]. Topic: Clinical - Red Word Triage >> Nov 18, 2024  9:45 AM Kelsey [name] wrote: Red Word that prompted transfer to Nurse Triage: Head cold, body chills, sore throat, did not check temp but is warm.. Bothering asthma as well, feel SOB and wheezing off and on Reason for Disposition  Fever present > 3 days (72 hours)  Answer Assessment - Initial Assessment Questions 1. SYMPTOMS: What is your main symptom or concern? (e.g., cough, fever, shortness of breath, muscle aches)     Cough, nasal congestion, sore throat, body ache, chills head ache 2. ONSET: When did the symptoms start?      Sunday 3. COUGH: Do you have a cough? If Yes, ask: How bad is the cough?       Non productive 4. FEVER: Do you have a fever? If Yes, ask: What is your temperature, how was it measured, and when did it start?     Suspected not measured 5. BREATHING DIFFICULTY: Are you having any difficulty breathing? (e.g., normal; shortness of breath, wheezing, unable to speak)       Wheezing at time 6. BETTER-SAME-WORSE: Are you getting better, staying the same or getting worse compared to yesterday?  If getting worse, ask, In what way?     worse 7. OTHER SYMPTOMS: Do you have any other symptoms?  (e.g., chills, fatigue, headache, loss of smell or taste, muscle pain, sore throat)     See above 8. INFLUENZA EXPOSURE: Was there any known exposure to influenza (flu) before the symptoms began?      Exposed  over the weekend 11. HIGH RISK FOR COMPLICATIONS: Do you have any chronic medical problems? (e.g., asthma, heart or lung disease, obesity, weak immune system)       Asthma, well controlled  Protocols used: Influenza (Flu) Suspected-A-AH  "

## 2024-11-18 NOTE — Progress Notes (Signed)
 "  Subjective:    Patient ID: Kelsey [name], female    DOB: 1981-10-25, 43 y.o.   MRN: [id number]  HPI Patient and her husband have both had flulike symptoms since Sunday.  Symptoms include fevers, chills, body aches, cough, and rhinorrhea.  She denies any shortness of breath or chest pain.  Flu test is positive for influenza A today. Past Medical History:  Diagnosis Date   Anxiety    Asthma    hosp. 2018 and 2019 no h/o intubation    Depression    GERD (gastroesophageal reflux disease)    Hypertension    Palpitations    Perianal abscess 11/22/2016   12/07/2016.  [facility], [place].  Alm Angle, MD.  Perianal abscess is much better but still needs local wound care.  Continue sitz baths 2-3 times perday until all drainage and pain are gone.  Follow up as needed.   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   Procedure: Left TOTAL HIP ARTHROPLASTY ANTERIOR APPROACH;  Surgeon: Vernetta Lonni GRADE, MD;  Location: [facility];  Service: Orthopedics;  Laterality: Left;   TOTAL HIP ARTHROPLASTY Right 05/05/2022   Procedure: RIGHT TOTAL HIP ARTHROPLASTY ANTERIOR APPROACH;  Surgeon: Vernetta Lonni GRADE, MD;  Location: [facility];  Service: Orthopedics;  Laterality: Right;   Medications Ordered Prior to Encounter[1] Allergies[2] Social History   Socioeconomic History   Marital status: Married    Spouse name: Not on  file   Number of children: 2   Years of education: Not on file   Highest education level: Associate degree: occupational, scientist, product/process development, or vocational program  Occupational History   Occupation: respiratory therapists    Employer: [employer]  Tobacco Use   Smoking status: Former    Current packs/day: 0.00    Average packs/day: 0.3 packs/day for 10.0 years (2.5 ttl pk-yrs)    Types: Cigarettes    Quit date: 02/26/2024    Years since quitting: 0.7   Smokeless tobacco: Never  Vaping Use   Vaping status: Never Used  Substance and Sexual Activity   Alcohol use: Yes    Alcohol/week: 4.0 standard drinks of alcohol    Types: 4 Shots of liquor per week   Drug use: Not Currently    Types: Marijuana   Sexual activity: Not on file  Other Topics Concern   Not on file  Social History Narrative   Respiratory Therapist, 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    College ed    2 kids    Divorced but remarried since 05/2019 new last name will be [name]   Wears seat belt, feels safe in relationship    Right handed   Social Drivers of Health   Tobacco Use: Medium Risk (11/18/2024)   Patient History    Smoking Tobacco Use: Former    Smokeless Tobacco Use: Never    Passive Exposure: Not on file  Financial Resource Strain: Medium Risk (07/17/2024)  Overall Financial Resource Strain (CARDIA)    Difficulty of Paying Living Expenses: Somewhat hard  Food Insecurity: No Food Insecurity (07/17/2024)   Epic    Worried About Programme Researcher, Broadcasting/film/video in the Last Year: Never true    Ran Out of Food in the Last Year: Never true  Transportation Needs: No Transportation Needs (07/17/2024)   Epic    Lack of Transportation (Medical): No    Lack of Transportation (Non-Medical): No  Physical Activity: Insufficiently Active (07/17/2024)   Exercise Vital Sign    Days of Exercise per Week: 3 days    Minutes of Exercise per Session: 20 min  Stress: No Stress Concern Present (07/17/2024)   [employer] of Occupational Health -  Occupational Stress Questionnaire    Feeling of Stress: Only a little  Social Connections: Moderately Integrated (07/17/2024)   Social Connection and Isolation Panel    Frequency of Communication with Friends and Family: Twice a week    Frequency of Social Gatherings with Friends and Family: Once a week    Attends Religious Services: More than 4 times per year    Active Member of [employer] or Organizations: No    Attends Banker Meetings: Not on file    Marital Status: Married  Catering Manager Violence: Not At Risk (12/24/2022)   Humiliation, Afraid, Rape, and Kick questionnaire    Fear of Current or Ex-Partner: No    Emotionally Abused: No    Physically Abused: No    Sexually Abused: No  Depression (PHQ2-9): Low Risk (11/18/2024)   Depression (PHQ2-9)    PHQ-2 Score: 0  Alcohol Screen: Low Risk (07/15/2023)   Alcohol Screen    Last Alcohol Screening Score (AUDIT): 0  Housing: Low Risk (07/17/2024)   Epic    Unable to Pay for Housing in the Last Year: No    Number of Times Moved in the Last Year: 1    Homeless in the Last Year: No  Utilities: Not At Risk (12/24/2022)   AHC Utilities    Threatened with loss of utilities: No  Health Literacy: Not on file      Review of Systems  All other systems reviewed and are negative.      Objective:   Physical Exam Constitutional:      General: She is not in acute distress.    Appearance: Normal appearance. She is normal weight. She is not ill-appearing or toxic-appearing.  HENT:     Head: Normocephalic and atraumatic.     Right Ear: Tympanic membrane and ear canal normal.     Left Ear: Tympanic membrane and ear canal normal.     Nose: Congestion and rhinorrhea present.     Mouth/Throat:     Mouth: Mucous membranes are moist.     Pharynx: No oropharyngeal exudate or posterior oropharyngeal erythema.  Cardiovascular:     Rate and Rhythm: Normal rate and regular rhythm.     Heart sounds: Normal heart sounds. No murmur  heard.    No gallop.  Pulmonary:     Effort: Pulmonary effort is normal. No respiratory distress.     Breath sounds: No wheezing, rhonchi or rales.  Lymphadenopathy:     Cervical: No cervical adenopathy.  Neurological:     Mental Status: She is alert.           Assessment & Plan:   Generalized body aches - Plan: Influenza A and B Ag, Immunoassay Flu test is positive for influenza A.  She is  outside the 48-hour window but she would still like to try Tamiflu  75 mg twice daily for 5 days.  I also called in a prescription for her husband.  Recommended Motrin  800 mg every 8 hours, rest, and pushing fluids.    [1]  Current Outpatient Medications on File Prior to Visit  Medication Sig Dispense Refill   albuterol  (PROVENTIL ) (2.5 MG/3ML) 0.083% nebulizer solution Take 3 mLs (2.5 mg total) by nebulization every 4 (four) hours as needed for wheezing or shortness of breath. 75 mL 2   Albuterol -Budesonide  (AIRSUPRA ) 90-80 MCG/ACT AERO INHALE 2 PUFFS BY MOUTH EVERY 6 HOURS AS NEEDED 33 g 5   gabapentin  (NEURONTIN ) 300 MG capsule TAKE 2 CAPSULES BY MOUTH 3 TIMES DAILY. 540 capsule 1   hydrOXYzine  (VISTARIL ) 25 MG capsule TAKE 1 CAPSULE BY MOUTH EVERY 8 HOURS AS NEEDED 270 capsule 1   levonorgestrel  (MIRENA ) 20 MCG/24HR IUD 1 Intra Uterine Device (1 each total) by Intrauterine route once. 1 each 0   methylPREDNISolone  (MEDROL  DOSEPAK) 4 MG TBPK tablet Take as package instructions. 1 each 0   promethazine -dextromethorphan  (PROMETHAZINE -DM) 6.25-15 MG/5ML syrup Take 5 mLs by mouth 4 (four) times daily as needed. 118 mL 0   No current facility-administered medications on file prior to visit.  [2]  Allergies Allergen Reactions   Penicillins Rash    Has patient had a PCN reaction causing immediate rash, facial/tongue/throat swelling, SOB or lightheadedness with hypotension: No Has patient had a PCN reaction causing severe rash involving mucus membranes or skin necrosis: No Has patient had a PCN  reaction that required hospitalization No Has patient had a PCN reaction occurring within the last 10 years: No If all of the above answers are NO, then may proceed with Cephalosporin use.   "

## 2024-12-04 ENCOUNTER — Ambulatory Visit: Payer: Self-pay

## 2024-12-04 NOTE — Telephone Encounter (Signed)
 FYI Only or Action Required?: FYI only for provider: appointment scheduled on 01.16.26.  Patient was last seen in primary care on 11/18/2024 by Duanne Butler DASEN, MD.  Called Nurse Triage reporting Palpitations.  Symptoms began several months ago.  Interventions attempted: Nothing.  Symptoms are: stable.  Triage Disposition: Home Care  Patient/caregiver understands and will follow disposition?: Yes  Copied from CRM #[id number]. Topic: Clinical - Red Word Triage >> Dec 04, 2024  3:27 PM Myrick T wrote: Red Word that prompted transfer to Nurse Triage: patient said for 20 mins now she has had the feeling of heart palpatations and she feels shaky Reason for Disposition  [1] Skipped or extra beat(s) AND [2] occurs < 4 times / minute  Answer Assessment - Initial Assessment Questions 1. DESCRIPTION: Please describe your heart rate or heartbeat that you are having (e.g., fast/slow, regular/irregular, skipped or extra beats, palpitations)     Fluttering   2. ONSET: When did it start? (e.g., minutes, hours, days)      X a few months  3. DURATION: How long does it last (e.g., seconds, minutes, hours)     5 mins  4. PATTERN Does it come and go, or has it been constant since it started?  Does it get worse with exertion?   Are you feeling it now?      Comes and goes  5. HEART RATE: Can you tell me your heart rate? How many beats in 15 seconds?  Note: Not all patients can do this.       Normal  6. RECURRENT SYMPTOM: Have you ever had this before? If Yes, ask: When was the last time? and What happened that time?      Recurrent  8. CAUSE: What do you think is causing the palpitations?     Unsure  9. CARDIAC HISTORY: Do you have any history of heart disease? (e.g., heart attack, angina, bypass surgery, angioplasty, arrhythmia)      Hx of HTN 10. OTHER SYMPTOMS: Pt denies chest pain, sweating, difficulty breathing        Dizziness when feeling flutter.   11.  PREGNANCY: Is there any chance you are pregnant? When was your last menstrual period?       Denies  Pt reports palpitations Pt scheduled for a visit on  01.16.26 for further evaluation. Pt informed to call back if symptoms worsen Pt agrees with plan of care, will call back for any worsening symptoms  Protocols used: Heart Rate and Heartbeat Questions-A-AH

## 2024-12-04 NOTE — Telephone Encounter (Signed)
Noted patient scheduled

## 2024-12-12 ENCOUNTER — Ambulatory Visit: Payer: Self-pay | Admitting: Family Medicine

## 2024-12-17 ENCOUNTER — Encounter: Payer: Self-pay | Admitting: Internal Medicine

## 2024-12-17 MED ORDER — ALBUTEROL SULFATE HFA 108 (90 BASE) MCG/ACT IN AERS: 2.0000 | INHALATION_SPRAY | Freq: Four times a day (QID) | RESPIRATORY_TRACT | 0 refills | Status: AC | PRN

## 2025-01-19 ENCOUNTER — Ambulatory Visit: Admitting: Internal Medicine
